# Patient Record
Sex: Female | Born: 1940 | Race: White | Hispanic: Refuse to answer | Smoking: Never smoker
Health system: Southern US, Community
[De-identification: ages and names within clinical notes are randomized; demographics above are authoritative.]

## PROBLEM LIST (undated history)

## (undated) DIAGNOSIS — N189 Chronic kidney disease, unspecified: Secondary | ICD-10-CM

## (undated) DIAGNOSIS — I6529 Occlusion and stenosis of unspecified carotid artery: Secondary | ICD-10-CM

## (undated) DIAGNOSIS — Q240 Dextrocardia: Secondary | ICD-10-CM

## (undated) DIAGNOSIS — G629 Polyneuropathy, unspecified: Secondary | ICD-10-CM

## (undated) DIAGNOSIS — I219 Acute myocardial infarction, unspecified: Secondary | ICD-10-CM

## (undated) DIAGNOSIS — E785 Hyperlipidemia, unspecified: Secondary | ICD-10-CM

## (undated) DIAGNOSIS — H269 Unspecified cataract: Secondary | ICD-10-CM

## (undated) DIAGNOSIS — Z5189 Encounter for other specified aftercare: Secondary | ICD-10-CM

## (undated) DIAGNOSIS — D649 Anemia, unspecified: Secondary | ICD-10-CM

## (undated) DIAGNOSIS — G709 Myoneural disorder, unspecified: Secondary | ICD-10-CM

## (undated) DIAGNOSIS — I2510 Atherosclerotic heart disease of native coronary artery without angina pectoris: Secondary | ICD-10-CM

## (undated) DIAGNOSIS — Q893 Situs inversus: Secondary | ICD-10-CM

## (undated) DIAGNOSIS — I509 Heart failure, unspecified: Secondary | ICD-10-CM

## (undated) DIAGNOSIS — T148XXA Other injury of unspecified body region, initial encounter: Secondary | ICD-10-CM

## (undated) DIAGNOSIS — I2699 Other pulmonary embolism without acute cor pulmonale: Secondary | ICD-10-CM

## (undated) DIAGNOSIS — M810 Age-related osteoporosis without current pathological fracture: Secondary | ICD-10-CM

## (undated) DIAGNOSIS — I82409 Acute embolism and thrombosis of unspecified deep veins of unspecified lower extremity: Secondary | ICD-10-CM

## (undated) DIAGNOSIS — M1990 Unspecified osteoarthritis, unspecified site: Secondary | ICD-10-CM

## (undated) DIAGNOSIS — E119 Type 2 diabetes mellitus without complications: Secondary | ICD-10-CM

## (undated) DIAGNOSIS — I255 Ischemic cardiomyopathy: Secondary | ICD-10-CM

## (undated) DIAGNOSIS — I5022 Chronic systolic (congestive) heart failure: Secondary | ICD-10-CM

## (undated) DIAGNOSIS — M069 Rheumatoid arthritis, unspecified: Secondary | ICD-10-CM

## (undated) DIAGNOSIS — Z8719 Personal history of other diseases of the digestive system: Secondary | ICD-10-CM

## (undated) DIAGNOSIS — T7840XA Allergy, unspecified, initial encounter: Secondary | ICD-10-CM

## (undated) DIAGNOSIS — R0902 Hypoxemia: Secondary | ICD-10-CM

## (undated) DIAGNOSIS — M549 Dorsalgia, unspecified: Secondary | ICD-10-CM

## (undated) DIAGNOSIS — I10 Essential (primary) hypertension: Secondary | ICD-10-CM

## (undated) DIAGNOSIS — G8929 Other chronic pain: Secondary | ICD-10-CM

## (undated) DIAGNOSIS — K219 Gastro-esophageal reflux disease without esophagitis: Secondary | ICD-10-CM

## (undated) DIAGNOSIS — M359 Systemic involvement of connective tissue, unspecified: Secondary | ICD-10-CM

## (undated) DIAGNOSIS — J189 Pneumonia, unspecified organism: Secondary | ICD-10-CM

## (undated) HISTORY — DX: Gastro-esophageal reflux disease without esophagitis: K21.9

## (undated) HISTORY — DX: Ischemic cardiomyopathy: I25.5

## (undated) HISTORY — DX: Chronic systolic (congestive) heart failure: I50.22

## (undated) HISTORY — DX: Hypoxemia: R09.02

## (undated) HISTORY — DX: Chronic kidney disease, unspecified: N18.9

## (undated) HISTORY — DX: Allergy, unspecified, initial encounter: T78.40XA

## (undated) HISTORY — DX: Hyperlipidemia, unspecified: E78.5

## (undated) HISTORY — DX: Myoneural disorder, unspecified: G70.9

## (undated) HISTORY — PX: CHOLECYSTECTOMY: SHX55

## (undated) HISTORY — PX: VAGINAL HYSTERECTOMY: SUR661

## (undated) HISTORY — DX: Occlusion and stenosis of unspecified carotid artery: I65.29

## (undated) HISTORY — DX: Encounter for other specified aftercare: Z51.89

## (undated) HISTORY — DX: Age-related osteoporosis without current pathological fracture: M81.0

## (undated) HISTORY — DX: Other pulmonary embolism without acute cor pulmonale: I26.99

## (undated) HISTORY — PX: BACK SURGERY: SHX140

## (undated) HISTORY — DX: Type 2 diabetes mellitus without complications: E11.9

## (undated) HISTORY — DX: Unspecified cataract: H26.9

## (undated) HISTORY — DX: Atherosclerotic heart disease of native coronary artery without angina pectoris: I25.10

## (undated) HISTORY — DX: Situs inversus: Q89.3

---

## 1998-02-15 ENCOUNTER — Ambulatory Visit (HOSPITAL_COMMUNITY): Admission: RE | Admit: 1998-02-15 | Discharge: 1998-02-15 | Payer: Self-pay | Admitting: Family Medicine

## 2000-03-13 ENCOUNTER — Encounter: Admission: RE | Admit: 2000-03-13 | Discharge: 2000-03-13 | Payer: Self-pay | Admitting: Orthopedic Surgery

## 2000-03-13 ENCOUNTER — Encounter: Payer: Self-pay | Admitting: Orthopedic Surgery

## 2000-06-30 ENCOUNTER — Encounter: Payer: Self-pay | Admitting: Family Medicine

## 2000-06-30 ENCOUNTER — Observation Stay (HOSPITAL_COMMUNITY): Admission: EM | Admit: 2000-06-30 | Discharge: 2000-07-01 | Payer: Self-pay | Admitting: Emergency Medicine

## 2002-04-13 ENCOUNTER — Emergency Department (HOSPITAL_COMMUNITY): Admission: EM | Admit: 2002-04-13 | Discharge: 2002-04-13 | Payer: Self-pay | Admitting: Emergency Medicine

## 2002-04-13 ENCOUNTER — Encounter: Payer: Self-pay | Admitting: *Deleted

## 2004-09-04 ENCOUNTER — Ambulatory Visit (HOSPITAL_COMMUNITY): Admission: RE | Admit: 2004-09-04 | Discharge: 2004-09-04 | Payer: Self-pay | Admitting: Family Medicine

## 2005-08-07 ENCOUNTER — Ambulatory Visit: Payer: Self-pay | Admitting: Internal Medicine

## 2005-09-12 ENCOUNTER — Ambulatory Visit: Payer: Self-pay | Admitting: *Deleted

## 2006-03-15 ENCOUNTER — Ambulatory Visit: Payer: Self-pay | Admitting: Internal Medicine

## 2006-04-03 ENCOUNTER — Ambulatory Visit: Payer: Self-pay | Admitting: Internal Medicine

## 2006-09-05 ENCOUNTER — Ambulatory Visit: Payer: Self-pay | Admitting: Internal Medicine

## 2006-09-16 ENCOUNTER — Ambulatory Visit: Payer: Self-pay | Admitting: Internal Medicine

## 2007-02-19 ENCOUNTER — Encounter (INDEPENDENT_AMBULATORY_CARE_PROVIDER_SITE_OTHER): Payer: Self-pay | Admitting: *Deleted

## 2011-05-24 ENCOUNTER — Emergency Department: Payer: Self-pay | Admitting: Emergency Medicine

## 2011-05-24 DIAGNOSIS — T148XXA Other injury of unspecified body region, initial encounter: Secondary | ICD-10-CM

## 2011-05-24 HISTORY — DX: Other injury of unspecified body region, initial encounter: T14.8XXA

## 2011-06-01 ENCOUNTER — Emergency Department (HOSPITAL_COMMUNITY): Payer: Medicare Other

## 2011-06-01 ENCOUNTER — Encounter: Payer: Self-pay | Admitting: *Deleted

## 2011-06-01 ENCOUNTER — Other Ambulatory Visit: Payer: Self-pay

## 2011-06-01 ENCOUNTER — Inpatient Hospital Stay (HOSPITAL_COMMUNITY)
Admission: EM | Admit: 2011-06-01 | Discharge: 2011-06-26 | DRG: 853 | Disposition: A | Discharge status: Home Health | Payer: Medicare Other | Attending: Surgery | Admitting: Surgery

## 2011-06-01 DIAGNOSIS — J9600 Acute respiratory failure, unspecified whether with hypoxia or hypercapnia: Secondary | ICD-10-CM | POA: Diagnosis not present

## 2011-06-01 DIAGNOSIS — M069 Rheumatoid arthritis, unspecified: Secondary | ICD-10-CM | POA: Diagnosis present

## 2011-06-01 DIAGNOSIS — I5021 Acute systolic (congestive) heart failure: Secondary | ICD-10-CM | POA: Diagnosis present

## 2011-06-01 DIAGNOSIS — K8020 Calculus of gallbladder without cholecystitis without obstruction: Secondary | ICD-10-CM | POA: Diagnosis present

## 2011-06-01 DIAGNOSIS — F411 Generalized anxiety disorder: Secondary | ICD-10-CM | POA: Diagnosis present

## 2011-06-01 DIAGNOSIS — A419 Sepsis, unspecified organism: Principal | ICD-10-CM | POA: Diagnosis present

## 2011-06-01 DIAGNOSIS — I2510 Atherosclerotic heart disease of native coronary artery without angina pectoris: Secondary | ICD-10-CM | POA: Diagnosis present

## 2011-06-01 DIAGNOSIS — Q240 Dextrocardia: Secondary | ICD-10-CM

## 2011-06-01 DIAGNOSIS — E876 Hypokalemia: Secondary | ICD-10-CM | POA: Diagnosis not present

## 2011-06-01 DIAGNOSIS — K8309 Other cholangitis: Secondary | ICD-10-CM | POA: Diagnosis not present

## 2011-06-01 DIAGNOSIS — K219 Gastro-esophageal reflux disease without esophagitis: Secondary | ICD-10-CM | POA: Diagnosis present

## 2011-06-01 DIAGNOSIS — I10 Essential (primary) hypertension: Secondary | ICD-10-CM | POA: Diagnosis present

## 2011-06-01 DIAGNOSIS — R6510 Systemic inflammatory response syndrome (SIRS) of non-infectious origin without acute organ dysfunction: Secondary | ICD-10-CM

## 2011-06-01 DIAGNOSIS — R5381 Other malaise: Secondary | ICD-10-CM | POA: Diagnosis present

## 2011-06-01 DIAGNOSIS — I214 Non-ST elevation (NSTEMI) myocardial infarction: Secondary | ICD-10-CM | POA: Diagnosis present

## 2011-06-01 DIAGNOSIS — Y998 Other external cause status: Secondary | ICD-10-CM

## 2011-06-01 DIAGNOSIS — K859 Acute pancreatitis without necrosis or infection, unspecified: Secondary | ICD-10-CM | POA: Diagnosis present

## 2011-06-01 DIAGNOSIS — S52123A Displaced fracture of head of unspecified radius, initial encounter for closed fracture: Secondary | ICD-10-CM | POA: Diagnosis present

## 2011-06-01 DIAGNOSIS — Z6835 Body mass index (BMI) 35.0-35.9, adult: Secondary | ICD-10-CM

## 2011-06-01 DIAGNOSIS — R7309 Other abnormal glucose: Secondary | ICD-10-CM | POA: Diagnosis present

## 2011-06-01 DIAGNOSIS — I509 Heart failure, unspecified: Secondary | ICD-10-CM | POA: Diagnosis present

## 2011-06-01 DIAGNOSIS — X58XXXA Exposure to other specified factors, initial encounter: Secondary | ICD-10-CM | POA: Diagnosis present

## 2011-06-01 DIAGNOSIS — N179 Acute kidney failure, unspecified: Secondary | ICD-10-CM

## 2011-06-01 DIAGNOSIS — Q248 Other specified congenital malformations of heart: Secondary | ICD-10-CM

## 2011-06-01 DIAGNOSIS — E2749 Other adrenocortical insufficiency: Secondary | ICD-10-CM | POA: Diagnosis present

## 2011-06-01 DIAGNOSIS — E669 Obesity, unspecified: Secondary | ICD-10-CM | POA: Diagnosis present

## 2011-06-01 DIAGNOSIS — I5022 Chronic systolic (congestive) heart failure: Secondary | ICD-10-CM

## 2011-06-01 DIAGNOSIS — R6520 Severe sepsis without septic shock: Secondary | ICD-10-CM | POA: Diagnosis present

## 2011-06-01 HISTORY — DX: Pneumonia, unspecified organism: J18.9

## 2011-06-01 HISTORY — DX: Anemia, unspecified: D64.9

## 2011-06-01 HISTORY — DX: Unspecified osteoarthritis, unspecified site: M19.90

## 2011-06-01 HISTORY — DX: Acute embolism and thrombosis of unspecified deep veins of unspecified lower extremity: I82.409

## 2011-06-01 HISTORY — DX: Acute myocardial infarction, unspecified: I21.9

## 2011-06-01 HISTORY — DX: Essential (primary) hypertension: I10

## 2011-06-01 HISTORY — DX: Personal history of other diseases of the digestive system: Z87.19

## 2011-06-01 HISTORY — DX: Polyneuropathy, unspecified: G62.9

## 2011-06-01 HISTORY — DX: Dextrocardia: Q24.0

## 2011-06-01 HISTORY — DX: Other injury of unspecified body region, initial encounter: T14.8XXA

## 2011-06-01 LAB — DIFFERENTIAL
Eosinophils Relative: 0 % (ref 0–5)
Lymphocytes Relative: 8 % — ABNORMAL LOW (ref 12–46)
Lymphs Abs: 1.2 10*3/uL (ref 0.7–4.0)
Monocytes Absolute: 0.5 10*3/uL (ref 0.1–1.0)

## 2011-06-01 LAB — COMPREHENSIVE METABOLIC PANEL
BUN: 32 mg/dL — ABNORMAL HIGH (ref 6–23)
CO2: 22 mEq/L (ref 19–32)
Calcium: 9.6 mg/dL (ref 8.4–10.5)
Creatinine, Ser: 1.39 mg/dL — ABNORMAL HIGH (ref 0.50–1.10)
GFR calc Af Amer: 44 mL/min — ABNORMAL LOW (ref >90)
GFR calc non Af Amer: 38 mL/min — ABNORMAL LOW (ref >90)
Glucose, Bld: 264 mg/dL — ABNORMAL HIGH (ref 70–99)

## 2011-06-01 LAB — CBC
HCT: 36.4 % (ref 36.0–46.0)
MCV: 92.4 fL (ref 78.0–100.0)
RBC: 3.94 MIL/uL (ref 3.87–5.11)
WBC: 14.9 10*3/uL — ABNORMAL HIGH (ref 4.0–10.5)

## 2011-06-01 LAB — TROPONIN I: Troponin I: <0.3 ng/mL (ref <0.30)

## 2011-06-01 LAB — D-DIMER, QUANTITATIVE: D-Dimer, Quant: 0.75 ug/mL-FEU — ABNORMAL HIGH (ref 0.00–0.48)

## 2011-06-01 MED ORDER — SODIUM CHLORIDE 0.9 % IV BOLUS (SEPSIS)
1000.0000 mL | Freq: Once | INTRAVENOUS | Status: AC
  Administered 2011-06-01: 1000 mL via INTRAVENOUS

## 2011-06-01 MED ORDER — CIPROFLOXACIN IN D5W 400 MG/200ML IV SOLN
400.0000 mg | Freq: Once | INTRAVENOUS | Status: AC
  Administered 2011-06-01: 400 mg via INTRAVENOUS
  Filled 2011-06-01: qty 200

## 2011-06-01 MED ORDER — ONDANSETRON HCL 4 MG/2ML IJ SOLN
4.0000 mg | Freq: Once | INTRAMUSCULAR | Status: AC
  Administered 2011-06-01: 4 mg via INTRAVENOUS
  Filled 2011-06-01: qty 2

## 2011-06-01 MED ORDER — ONDANSETRON HCL 4 MG/2ML IJ SOLN: 4.0000 mg | Freq: Once | INTRAMUSCULAR | Status: DC

## 2011-06-01 MED ORDER — SODIUM CHLORIDE 0.9 % IV BOLUS (SEPSIS): 1000.0000 mL | Freq: Once | INTRAVENOUS | Status: DC

## 2011-06-01 MED ORDER — NITROGLYCERIN 0.4 MG SL SUBL
0.4000 mg | SUBLINGUAL_TABLET | SUBLINGUAL | Status: DC | PRN
Start: 2011-06-01 — End: 2011-06-03
  Administered 2011-06-01: 0.4 mg via SUBLINGUAL
  Filled 2011-06-01: qty 25

## 2011-06-01 MED ORDER — MORPHINE SULFATE 4 MG/ML IJ SOLN
4.0000 mg | Freq: Once | INTRAMUSCULAR | Status: AC
Start: 2011-06-01 — End: 2011-06-01
  Administered 2011-06-01: 4 mg via INTRAVENOUS
  Filled 2011-06-01: qty 1

## 2011-06-01 MED ORDER — MORPHINE SULFATE 4 MG/ML IJ SOLN
4.0000 mg | Freq: Once | INTRAMUSCULAR | Status: AC
  Administered 2011-06-01: 4 mg via INTRAVENOUS
  Filled 2011-06-01: qty 1

## 2011-06-01 MED ORDER — METRONIDAZOLE IN NACL 5-0.79 MG/ML-% IV SOLN
500.0000 mg | Freq: Three times a day (TID) | INTRAVENOUS | Status: DC
  Administered 2011-06-01: 500 mg via INTRAVENOUS
  Filled 2011-06-01 (×2): qty 100

## 2011-06-01 MED ORDER — ONDANSETRON HCL 4 MG/2ML IJ SOLN
4.0000 mg | Freq: Once | INTRAMUSCULAR | Status: AC
  Administered 2011-06-01: 4 mg via INTRAVENOUS

## 2011-06-01 MED ORDER — ONDANSETRON HCL 4 MG/2ML IJ SOLN
4.0000 mg | Freq: Once | INTRAMUSCULAR | Status: DC
  Filled 2011-06-01: qty 2

## 2011-06-01 NOTE — ED Notes (Signed)
Pt presents with sudden onset of midsternal chest pain that radiates under L breast and around to L scapula.  Pt reports pain x 1-1.5 hours, reports pain began at 8-9/10 and is now 10/10.  +nausea and shortness of breath.  Pt is coughing x 2-3 days, has difficulty bringing up mucus, but is finally able to cough.  Pt has RUE in splint with sling in place, reports she fell forward x 1 week ago with fracture.  Pt is alert, oriented, reports no medication given PTA gave any relief.

## 2011-06-01 NOTE — ED Notes (Signed)
Patient transported to CT 

## 2011-06-01 NOTE — ED Notes (Signed)
Patient presents to ed via [facility] states she started having chest pain  Approx. 1 hours PTA, pain is worse on inspiration and palpation. C/o nausea. Patient was given asa x 4 chewable per ems pta, ntg, x 3 per ems pta with no relief.

## 2011-06-01 NOTE — ED Provider Notes (Signed)
History     CSN: [id number]  Arrival date & time 06/01/11  1429   First MD Initiated Contact with Patient 06/01/11 1508      Chief Complaint  Patient presents with  . Chest Pain    (Consider location/radiation/quality/duration/timing/severity/associated sxs/prior treatment) The history is provided by the patient and the spouse.   Patient is a 70 year old female with history of hypertension and dyslipidemia presents with chest pain. This started about 2 hours prior to arrival. It is located in the  low center chest and epigastric area. It occasionally radiates to back and around the left side. She has associated nausea and vomiting. Minimal dyspnea. She has had mildly productive cough for the last few days. Patient was given aspirin and nitroglycerin prior to arrival with EMS. She does not note any change in her symptoms after these interventions. No other modifying factors noted. Patient denies recent fever. She also denies history of gallbladder, pancreas, obstruction issues. Patient did recently have a fall about one week ago. She sustained elbow fracture that was splinted. No surgery. She did not sustain chest wall injury during his fall. Overall is doing well since a fall. Patient denies lower extremity swelling or pain. She does have remote history of DVT but no history of PE. Overall severity noted to be severe.  Past Medical History  Diagnosis Date  . Arthritis   . Neuropathy   . Reflux   . Hypertension   . Elevated cholesterol     Past Surgical History  Procedure Date  . Elbow fracture surgery     History reviewed. No pertinent family history. MI in father in his 28s. History  Substance Use Topics  . Smoking status: Never Smoker   . Smokeless tobacco: Not on file  . Alcohol Use: No    OB History    Grav Para Term Preterm Abortions TAB SAB Ect Mult Living                  Review of Systems  Constitutional: Negative for fever and chills.  HENT: Negative for  facial swelling.   Eyes: Negative for visual disturbance.  Respiratory: Positive for cough. Negative for chest tightness, shortness of breath and wheezing.   Cardiovascular: Positive for chest pain. Negative for leg swelling.  Gastrointestinal: Positive for nausea, vomiting and abdominal pain. Negative for diarrhea.  Genitourinary: Negative for difficulty urinating.  Skin: Negative for rash.  Neurological: Negative for weakness and numbness.  Psychiatric/Behavioral: Negative for behavioral problems and confusion.  All other systems reviewed and are negative.    Allergies  Penicillins  Home Medications   Current Outpatient Rx  Name Route Sig Dispense Refill  . ALLOPURINOL 100 MG PO TABS Oral Take 200 mg by mouth daily.      . ASPIRIN 325 MG PO TABS Oral Take 325 mg by mouth daily.      Marland Kitchen GABAPENTIN 300 MG PO CAPS Oral Take 300 mg by mouth 2 (two) times daily.      Marland Kitchen HYDROCODONE-ACETAMINOPHEN 5-500 MG PO TABS Oral Take 1 tablet by mouth every 4 (four) hours as needed. For pain.     Marland Kitchen LISINOPRIL-HYDROCHLOROTHIAZIDE 20-25 MG PO TABS Oral Take 1 tablet by mouth every morning.      Marland Kitchen OMEPRAZOLE 20 MG PO CPDR Oral Take 20 mg by mouth every morning.      Marland Kitchen PREDNISONE 5 MG PO TABS Oral Take 5 mg by mouth daily.      Marland Kitchen SIMVASTATIN 40 MG PO  TABS Oral Take 40 mg by mouth at bedtime.        BP 85/46  Pulse 146  Temp(Src) 101.7 F (38.7 C) (Oral)  Resp 29  Wt 200 lb 9.9 oz (91 kg)  SpO2 99%  Physical Exam  Nursing note and vitals reviewed. Constitutional: She is oriented to person, place, and time. She appears well-developed and well-nourished. No distress.       Uncomfortable appearing  HENT:  Head: Normocephalic.  Nose: Nose normal.  Eyes: EOM are normal.  Neck: Normal range of motion. Neck supple.  Cardiovascular: Normal rate, regular rhythm and intact distal pulses.   Pulmonary/Chest: Effort normal and breath sounds normal. No respiratory distress. She has no wheezes. She  exhibits tenderness (Tenderness to palpation along lower left ribs around to posterior ribs).  Abdominal: Soft. She exhibits no distension.       Moderate to severe epigastric tenderness to palpation without guarding, rebound.  No peritonitis. Pain worse in the epigastrium, minimal right upper quadrant tenderness to palpation.  Musculoskeletal: Normal range of motion. She exhibits no edema and no tenderness.       No calf TTP  Neurological: She is alert and oriented to person, place, and time.       Normal strength  Skin: Skin is warm and dry. No rash noted. She is not diaphoretic.  Psychiatric: She has a normal mood and affect. Her behavior is normal. Thought content normal.    ED Course  Procedures (including critical care time)  ECG: Heart rate 100. Sinus rhythm.  Normal axis. Normal intervals. Low voltage. Q wave in V2. Mild nonspecific T wave changes. Poor R wave progression.  Sinus rhythm with poor R wave progression but no acute ischemia.  Poor R wave progression but no new ischemic changes compared to 04/13/02.     Labs Reviewed  CBC - Abnormal; Notable for the following:    WBC 14.9 (*)    All other components within normal limits  DIFFERENTIAL - Abnormal; Notable for the following:    Neutrophils Relative 88 (*)    Neutro Abs 13.1 (*)    Lymphocytes Relative 8 (*)    All other components within normal limits  COMPREHENSIVE METABOLIC PANEL - Abnormal; Notable for the following:    Glucose, Bld 264 (*)    BUN 32 (*)    Creatinine, Ser 1.39 (*)    Albumin 3.0 (*)    AST 119 (*)    ALT 158 (*)    Alkaline Phosphatase 420 (*)    Total Bilirubin 1.7 (*)    GFR calc non Af Amer 38 (*)    GFR calc Af Amer 44 (*)    All other components within normal limits  LIPASE, BLOOD - Abnormal; Notable for the following:    Lipase 69 (*)    All other components within normal limits  D-DIMER, QUANTITATIVE - Abnormal; Notable for the following:    D-Dimer, Quant 0.75 (*)    All other  components within normal limits  LACTIC ACID, PLASMA - Abnormal; Notable for the following:    Lactic Acid, Venous 5.1 (*)    All other components within normal limits  POCT I-STAT 3, BLOOD GAS (G3P V) - Abnormal; Notable for the following:    pH, Ven 7.333 (*)    pCO2, Ven 39.2 (*)    Acid-base deficit 5.0 (*)    All other components within normal limits  TROPONIN I  PROCALCITONIN  APTT  PROTIME-INR  BLOOD  GAS, VENOUS   Ct Abdomen Pelvis Wo Contrast  06/01/2011  *RADIOLOGY REPORT*  Clinical Data: Chest and abdominal pain.  Nausea.  CT ABDOMEN AND PELVIS WITHOUT CONTRAST  Technique:  Multidetector CT imaging of the abdomen and pelvis was performed following the standard protocol without intravenous contrast.  Comparison: None.  Findings: Atelectasis or consolidation in the lung bases. Dextrocardia with right sided aortic arch.  Situs inversus. Cholelithiasis without obvious bile duct dilatation.  Other than the abdominal situs inversus, the unenhanced liver, spleen, stomach, adrenal glands, kidneys, and retroperitoneal lymph nodes are unremarkable.  Mild calcification of the abdominal aorta without aneurysm.  There is a suggestion of minimal infiltrative change around the head of the pancreas which could represent early focal pancreatitis.  Laboratory correlation is suggested.  No apparent pancreatic ductal dilatation.  No free fluid or free air in the abdomen.  The small bowel are not dilated.  The colon is decompressed without obvious wall thickening.  No free air or free fluid in the abdomen.  Pelvis:  The bladder wall is not thickened.  The uterus appears atrophic.  No abnormal adnexal masses.  No free or loculated pelvic fluid collections.  No inflammatory changes suggested in the sigmoid colon.  The appendix is normal.  Mild degenerative changes in the spine. Soft tissue calcification in the buttocks consistent with injection granulomas.  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 Cholelithiasis without obvious bile duct dilatation.  Suggestion of mild infiltration around the head of the pancreas which could represent early changes of pancreatitis.  Laboratory correlation recommended.  Infiltration or atelectasis in both lung bases.  Original Report Authenticated By: Marlon Pel, M.D.   Dg Chest 2 View  06/01/2011  *RADIOLOGY REPORT*  Clinical Data: Left-sided chest pain.  Shortness of breath.  CHEST - 2 VIEW  Comparison: Report of chest x-ray 04/13/2002 and 06/30/2000.  Findings: Dextrocardia is present.  Right-sided aortic arch is also noted.  There is eventration of the left hemidiaphragm as previously reported.  Minimal bibasilar airspace disease likely reflects atelectasis.  No other airspace consolidation is evident. The visualized soft tissues and bony thorax are unremarkable.  IMPRESSION:  1.  Dextrocardia and right-sided aortic arch, suggesting situs inversus. 2.  Stable eventration of left hemidiaphragm. 3.  Mild bibasilar airspace disease likely reflects atelectasis.  Original Report Authenticated By: Jamesetta Orleans. MATTERN, M.D.   US Abdomen Complete  06/01/2011  *RADIOLOGY REPORT*  Clinical Data:  Nausea, vomiting.  History of situs inversus.  COMPLETE ABDOMINAL ULTRASOUND  Comparison:  None.  Findings: Technically challenging examination secondary patient body habitus, situs inversus, and limited movement due to a right arm injury.  Gallbladder:  Gallstones are present.  No gallbladder wall thickening or pericholecystic fluid.  Negative sonographic Murphy's sign.  Common bile duct:  Mildly distended up to 8 mm.  No obstructing lesion identified however the distal duct is obscured by overlying bowel gas.  Liver:  Limited acoustic windows.  Suggestion of fatty infiltration.  IVC:  Not visualized.  Pancreas:  No focal abnormality identified within the head/neck. The body and tail are obscured by overlying bowel.  Spleen:  Within normal limits,  measuring 8 cm in diameter.  Right Kidney:  Normal sonographic appearance, without hydronephrosis.  Measures 9.6 cm.  Left Kidney:  Normal sonographic appearance, without hydronephrosis.  Measures 9.7 cm.  Abdominal aorta:  Portions were obscured by overlying bowel gas. Where seen, measures up to 2.8 cm in diameter.  IMPRESSION: Technically  challenging examination as described above.  Cholelithiasis without definitive sonographic evidence for cholecystitis.  The CBD is mildly distended up to 8 mm.  No obstructing lesion identified however the distal duct is obscured by overlying bowel gas.Correlate with LFTs and ERCP or MRCP if clinically warranted.  Original Report Authenticated By: Waneta Martins, M.D.     1. Cholelithiases   2. SIRS (systemic inflammatory response syndrome)       MDM   Patient with abrupt onset of abdominal pain, nausea, vomiting. Initial heart rate around 100 and normotensive. She did have left upper quadrant and epigastric tenderness to palpation no peritonitis. Lab studies significant for leukocytosis, elevated LFTs, mildly increased creatinine. Ultrasound was obtained. This ultrasound shows gallstones and common bile duct dilation without obvious stone. No gallbladder wall thickening or sonographic evidence of acute cholecystitis. I discussed his case with gastroenterology at [facility]. Patient will need additional evaluation for this dilated common bile duct. 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 They will follow patient in the morning. While in the ED, patient had progressive deterioration of her clinical status. Initially heart rate increasing up to 110s and then in the 120s. She's given IV fluids for dehydration and tachycardia. She was also started on Cipro, Flagyl and had continued management of her symptoms. With change in status, CT scan also ordered. This was remarkable for pancreatic head inflammation but no evidence of  perforation or surgical process. She maintained her mental status and blood pressure for the majority of this time. Patient initially was to go to internal medicine floor bed. However with change in clinical status, critical care consulted. Patient's blood pressure then started to decrease into the upper 70s and 80s. IV fluid resuscitation continued. Patient's still maintaining mental status. Still has warm extremities and 2+ distal pulses in arms and legs. Critical care evaluated the patient. They will admit to ICU. Overall change in clinical status appears consistent with progression of Sirs likely related to gallstone obstruction or pancreatitis.        Milus Glazier 06/02/11 [phone]

## 2011-06-01 NOTE — ED Notes (Signed)
C/o chest pain onset approx. 1 hour PTA

## 2011-06-02 ENCOUNTER — Encounter (HOSPITAL_COMMUNITY): Payer: Self-pay | Admitting: *Deleted

## 2011-06-02 ENCOUNTER — Emergency Department (HOSPITAL_COMMUNITY): Payer: Medicare Other

## 2011-06-02 ENCOUNTER — Inpatient Hospital Stay (HOSPITAL_COMMUNITY): Payer: Medicare Other

## 2011-06-02 DIAGNOSIS — E872 Acidosis: Secondary | ICD-10-CM

## 2011-06-02 DIAGNOSIS — A419 Sepsis, unspecified organism: Secondary | ICD-10-CM

## 2011-06-02 DIAGNOSIS — K8020 Calculus of gallbladder without cholecystitis without obstruction: Secondary | ICD-10-CM

## 2011-06-02 DIAGNOSIS — I509 Heart failure, unspecified: Secondary | ICD-10-CM

## 2011-06-02 DIAGNOSIS — R0902 Hypoxemia: Secondary | ICD-10-CM

## 2011-06-02 DIAGNOSIS — K859 Acute pancreatitis without necrosis or infection, unspecified: Secondary | ICD-10-CM

## 2011-06-02 LAB — COMPREHENSIVE METABOLIC PANEL
Albumin: 2.2 g/dL — ABNORMAL LOW (ref 3.5–5.2)
Alkaline Phosphatase: 291 U/L — ABNORMAL HIGH (ref 39–117)
BUN: 26 mg/dL — ABNORMAL HIGH (ref 6–23)
Creatinine, Ser: 1.5 mg/dL — ABNORMAL HIGH (ref 0.50–1.10)
GFR calc Af Amer: 40 mL/min — ABNORMAL LOW (ref >90)
Glucose, Bld: 210 mg/dL — ABNORMAL HIGH (ref 70–99)
Potassium: 3.4 mEq/L — ABNORMAL LOW (ref 3.5–5.1)
Total Bilirubin: 2.9 mg/dL — ABNORMAL HIGH (ref 0.3–1.2)
Total Protein: 5.3 g/dL — ABNORMAL LOW (ref 6.0–8.3)

## 2011-06-02 LAB — URINE MICROSCOPIC-ADD ON

## 2011-06-02 LAB — ABO/RH: ABO/RH(D): AB POS

## 2011-06-02 LAB — CBC
HCT: 27.6 % — ABNORMAL LOW (ref 36.0–46.0)
MCH: 30.4 pg (ref 26.0–34.0)
MCV: 94.2 fL (ref 78.0–100.0)
RBC: 2.93 MIL/uL — ABNORMAL LOW (ref 3.87–5.11)
WBC: 26.8 10*3/uL — ABNORMAL HIGH (ref 4.0–10.5)

## 2011-06-02 LAB — BASIC METABOLIC PANEL
BUN: 22 mg/dL (ref 6–23)
Calcium: 8.3 mg/dL — ABNORMAL LOW (ref 8.4–10.5)
Creatinine, Ser: 1.32 mg/dL — ABNORMAL HIGH (ref 0.50–1.10)
GFR calc non Af Amer: 40 mL/min — ABNORMAL LOW (ref >90)
Glucose, Bld: 146 mg/dL — ABNORMAL HIGH (ref 70–99)
Potassium: 4.3 mEq/L (ref 3.5–5.1)

## 2011-06-02 LAB — URINALYSIS, ROUTINE W REFLEX MICROSCOPIC
Glucose, UA: NEGATIVE mg/dL
Ketones, ur: 15 mg/dL — AB
Specific Gravity, Urine: 1.008 (ref 1.005–1.030)
pH: 5 (ref 5.0–8.0)

## 2011-06-02 LAB — POCT I-STAT 3, ART BLOOD GAS (G3+)
Bicarbonate: 19.4 mEq/L — ABNORMAL LOW (ref 20.0–24.0)
Patient temperature: 101.4
TCO2: 21 mmol/L (ref 0–100)
pH, Arterial: 7.307 — ABNORMAL LOW (ref 7.350–7.400)
pO2, Arterial: 87 mmHg (ref 80.0–100.0)

## 2011-06-02 LAB — GLUCOSE, CAPILLARY
Glucose-Capillary: 229 mg/dL — ABNORMAL HIGH (ref 70–99)
Glucose-Capillary: 136 mg/dL — ABNORMAL HIGH (ref 70–99)

## 2011-06-02 LAB — POCT I-STAT 3, VENOUS BLOOD GAS (G3P V)
Acid-base deficit: 5 mmol/L — ABNORMAL HIGH (ref 0.0–2.0)
O2 Saturation: 56 %
TCO2: 22 mmol/L (ref 0–100)
pCO2, Ven: 39.2 mmHg — ABNORMAL LOW (ref 45.0–50.0)
pO2, Ven: 31 mmHg (ref 30.0–45.0)

## 2011-06-02 LAB — PHOSPHORUS: Phosphorus: 2 mg/dL — ABNORMAL LOW (ref 2.3–4.6)

## 2011-06-02 LAB — PROTIME-INR
INR: 1.08 (ref 0.00–1.49)
INR: 1.35 (ref 0.00–1.49)
Prothrombin Time: 16.9 seconds — ABNORMAL HIGH (ref 11.6–15.2)

## 2011-06-02 LAB — LIPASE, BLOOD: Lipase: 38 U/L (ref 11–59)

## 2011-06-02 LAB — MAGNESIUM: Magnesium: 1.2 mg/dL — ABNORMAL LOW (ref 1.5–2.5)

## 2011-06-02 LAB — FIBRINOGEN: Fibrinogen: 605 mg/dL — ABNORMAL HIGH (ref 204–475)

## 2011-06-02 LAB — CARBOXYHEMOGLOBIN: O2 Saturation: 67.4 %

## 2011-06-02 LAB — EXPECTORATED SPUTUM ASSESSMENT W GRAM STAIN, RFLX TO RESP C

## 2011-06-02 LAB — LACTIC ACID, PLASMA: Lactic Acid, Venous: 1.7 mmol/L (ref 0.5–2.2)

## 2011-06-02 LAB — APTT: aPTT: 27 seconds (ref 24–37)

## 2011-06-02 LAB — CARDIAC PANEL(CRET KIN+CKTOT+MB+TROPI): Relative Index: 1.2 (ref 0.0–2.5)

## 2011-06-02 MED ORDER — SODIUM CHLORIDE 0.9 % IV BOLUS (SEPSIS)
1000.0000 mL | Freq: Once | INTRAVENOUS | Status: AC
  Administered 2011-06-02: 1000 mL via INTRAVENOUS

## 2011-06-02 MED ORDER — INSULIN ASPART 100 UNIT/ML ~~LOC~~ SOLN
0.0000 [IU] | SUBCUTANEOUS | Status: DC
  Administered 2011-06-02: 1 [IU] via SUBCUTANEOUS
  Administered 2011-06-02: 4 [IU] via SUBCUTANEOUS
  Administered 2011-06-03: 3 [IU] via SUBCUTANEOUS
  Administered 2011-06-03: 1 [IU] via SUBCUTANEOUS
  Administered 2011-06-03: 4 [IU] via SUBCUTANEOUS
  Filled 2011-06-02: qty 3

## 2011-06-02 MED ORDER — DOBUTAMINE IN D5W 4-5 MG/ML-% IV SOLN: 2.5000 ug/kg/min | INTRAVENOUS | Status: DC | PRN

## 2011-06-02 MED ORDER — METRONIDAZOLE IN NACL 5-0.79 MG/ML-% IV SOLN: 500.0000 mg | Freq: Once | INTRAVENOUS | Status: DC

## 2011-06-02 MED ORDER — DOPAMINE-DEXTROSE 3.2-5 MG/ML-% IV SOLN
2.0000 ug/kg/min | INTRAVENOUS | Status: DC
  Administered 2011-06-02: 5 ug/kg/min via INTRAVENOUS

## 2011-06-02 MED ORDER — ONDANSETRON HCL 4 MG/2ML IJ SOLN
4.0000 mg | Freq: Four times a day (QID) | INTRAMUSCULAR | Status: DC | PRN
  Administered 2011-06-04 – 2011-06-14 (×6): 4 mg via INTRAVENOUS
  Filled 2011-06-02 (×7): qty 2

## 2011-06-02 MED ORDER — SODIUM CHLORIDE 0.9 % IV SOLN: INTRAVENOUS | Status: DC

## 2011-06-02 MED ORDER — SODIUM CHLORIDE 0.9 % IV SOLN
1.0000 g | Freq: Every day | INTRAVENOUS | Status: DC
  Filled 2011-06-02: qty 1

## 2011-06-02 MED ORDER — SODIUM CHLORIDE 0.9 % IV BOLUS (SEPSIS)
500.0000 mL | Freq: Once | INTRAVENOUS | Status: AC
  Administered 2011-06-04: 500 mL via INTRAVENOUS

## 2011-06-02 MED ORDER — POTASSIUM CHLORIDE 10 MEQ/100ML IV SOLN
10.0000 meq | INTRAVENOUS | Status: AC
  Administered 2011-06-02 (×4): 10 meq via INTRAVENOUS
  Filled 2011-06-02 (×4): qty 100

## 2011-06-02 MED ORDER — SODIUM CHLORIDE 0.9 % IV BOLUS (SEPSIS): 1000.0000 mL | Freq: Once | INTRAVENOUS | Status: DC

## 2011-06-02 MED ORDER — METRONIDAZOLE IN NACL 5-0.79 MG/ML-% IV SOLN
500.0000 mg | Freq: Three times a day (TID) | INTRAVENOUS | Status: DC
  Filled 2011-06-02 (×2): qty 100

## 2011-06-02 MED ORDER — SODIUM CHLORIDE 0.9 % IV SOLN
500.0000 mg | Freq: Four times a day (QID) | INTRAVENOUS | Status: DC
  Administered 2011-06-02 (×2): 500 mg via INTRAVENOUS
  Filled 2011-06-02 (×3): qty 500

## 2011-06-02 MED ORDER — SODIUM CHLORIDE 0.9 % IV SOLN: 250.0000 mL | INTRAVENOUS | Status: DC | PRN

## 2011-06-02 MED ORDER — SODIUM CHLORIDE 0.9 % IJ SOLN
INTRAMUSCULAR | Status: AC
  Administered 2011-06-02: 10 mL
  Filled 2011-06-02: qty 10

## 2011-06-02 MED ORDER — SODIUM CHLORIDE 0.9 % IV BOLUS (SEPSIS): 500.0000 mL | INTRAVENOUS | Status: DC | PRN

## 2011-06-02 MED ORDER — NOREPINEPHRINE BITARTRATE 1 MG/ML IJ SOLN
2.0000 ug/min | INTRAVENOUS | Status: DC | PRN
  Administered 2011-06-02 – 2011-06-03 (×3): 10 ug/min via INTRAVENOUS
  Administered 2011-06-04: 12 ug/min via INTRAVENOUS
  Filled 2011-06-02 (×6): qty 8

## 2011-06-02 MED ORDER — SODIUM CHLORIDE 0.9 % IV BOLUS (SEPSIS)
500.0000 mL | Freq: Once | INTRAVENOUS | Status: AC
  Administered 2011-06-02: 500 mL via INTRAVENOUS

## 2011-06-02 MED ORDER — SODIUM CHLORIDE 0.9 % IV SOLN
INTRAVENOUS | Status: DC
  Administered 2011-06-02: 150 mL/h via INTRAVENOUS
  Administered 2011-06-03: 100 mL/h via INTRAVENOUS

## 2011-06-02 MED ORDER — ASPIRIN 300 MG RE SUPP
300.0000 mg | Freq: Once | RECTAL | Status: DC
  Filled 2011-06-02: qty 1

## 2011-06-02 MED ORDER — IMIPENEM-CILASTATIN 500 MG IV SOLR
500.0000 mg | Freq: Three times a day (TID) | INTRAVENOUS | Status: DC
  Administered 2011-06-02 – 2011-06-04 (×5): 500 mg via INTRAVENOUS
  Filled 2011-06-02 (×7): qty 500

## 2011-06-02 MED ORDER — LORAZEPAM 2 MG/ML IJ SOLN
INTRAMUSCULAR | Status: AC
  Filled 2011-06-02: qty 1

## 2011-06-02 MED ORDER — SODIUM CHLORIDE 0.9 % IV BOLUS (SEPSIS): 25.0000 mL/kg | Freq: Once | INTRAVENOUS | Status: DC

## 2011-06-02 MED ORDER — DOPAMINE-DEXTROSE 3.2-5 MG/ML-% IV SOLN: 2.0000 ug/kg/min | INTRAVENOUS | Status: DC

## 2011-06-02 MED ORDER — DOPAMINE-DEXTROSE 3.2-5 MG/ML-% IV SOLN
INTRAVENOUS | Status: AC
  Filled 2011-06-02: qty 250

## 2011-06-02 NOTE — ED Notes (Signed)
Central line placed, 2nd kit used, line placed without difficulty, tolatating well, VSS/unchanged, BP low 80/33, HR 143, pending portable cxr.

## 2011-06-02 NOTE — ED Notes (Addendum)
2nd central line kit obtained, continuing attempt at R IJ central line placement, tolerating procedure, no changes, VSS/unchanged.

## 2011-06-02 NOTE — Progress Notes (Signed)
eLink Physician-Brief Progress Note Patient Name: [name] DOB: 1941/01/17 MRN: [id number]  Date of Service  06/02/2011   HPI/Events of Note     eICU Interventions  ASA PR for pos trops   Intervention Category Intermediate Interventions: Diagnostic test evaluation  Tyshawn Ciullo V. 06/02/2011, 6:43 PM

## 2011-06-02 NOTE — Consult Note (Signed)
Reason for Consult:abdominal pain, gallstones, hypotension Referring Physician: Dr Midge Aver [name] is an 70 y.o. female.  HPI: 70 yo WF who developed acute onset of LUQ and epigastric pain yesterday around noon. She then developed nausea and vomiting.  She was in her usual state of health prior to that. She does report vomiting several times on Christmas Eve but without any abdominal pain. Pain is severe, 10/10, radiates to her back, and is described as a "grabbing" sensation.  No prior symptoms. Some lightheadedness over the past several days. Normal BM on Friday. Denies fevers, chills, melena, hematochezia, wt change, jaundice, NSAID use, hematemesis, ETOH use.  She fell in her church parking [place] recently and fractured her Rt elbow. Denies family hx of GI problems/cancers.   While the pt was in the ED, she became hypotensive with blood pressures in 70-80s. Additional IVF given and vasopressor started. She is now in the ICU febrile.  While off oxygen, the pt desaturates to a O2 sat in the high 80s and her RR increases.   Past Medical History  Diagnosis Date  . Arthritis   . Neuropathy   . Reflux   . Hypertension   . Elevated cholesterol   . Situs inversus     Past Surgical History  Procedure Date  . Elbow fracture surgery     History reviewed. No pertinent family history.  Social History:  reports that she has never smoked. She does not have any smokeless tobacco history on file. She reports that she does not drink alcohol or use illicit drugs.  Allergies:  Allergies  Allergen Reactions  . Penicillins Anaphylaxis    Medications: I have reviewed the patient's current medications.  Results for orders placed during the hospital encounter of 06/01/11 (from the past 48 hour(s))  CBC     Status: Abnormal   Collection Time   06/01/11  3:55 PM      Component Value Range Comment   WBC 14.9 (*) 4.0 - 10.5 (K/uL)    RBC 3.94  3.87 - 5.11 (MIL/uL)    Hemoglobin 12.2  12.0 - 15.0  (g/dL)    HCT 98.1  19.1 - 47.8 (%)    MCV 92.4  78.0 - 100.0 (fL)    MCH 31.0  26.0 - 34.0 (pg)    MCHC 33.5  30.0 - 36.0 (g/dL)    RDW 29.5  62.1 - 30.8 (%)    Platelets 319  150 - 400 (K/uL)   DIFFERENTIAL     Status: Abnormal   Collection Time   06/01/11  3:55 PM      Component Value Range Comment   Neutrophils Relative 88 (*) 43 - 77 (%)    Neutro Abs 13.1 (*) 1.7 - 7.7 (K/uL)    Lymphocytes Relative 8 (*) 12 - 46 (%)    Lymphs Abs 1.2  0.7 - 4.0 (K/uL)    Monocytes Relative 4  3 - 12 (%)    Monocytes Absolute 0.5  0.1 - 1.0 (K/uL)    Eosinophils Relative 0  0 - 5 (%)    Eosinophils Absolute 0.0  0.0 - 0.7 (K/uL)    Basophils Relative 0  0 - 1 (%)    Basophils Absolute 0.0  0.0 - 0.1 (K/uL)   COMPREHENSIVE METABOLIC PANEL     Status: Abnormal   Collection Time   06/01/11  3:55 PM      Component Value Range Comment   Sodium 138  135 - 145 (  mEq/L)    Potassium 3.9  3.5 - 5.1 (mEq/L)    Chloride 100  96 - 112 (mEq/L)    CO2 22  19 - 32 (mEq/L)    Glucose, Bld 264 (*) 70 - 99 (mg/dL)    BUN 32 (*) 6 - 23 (mg/dL)    Creatinine, Ser 1.19 (*) 0.50 - 1.10 (mg/dL)    Calcium 9.6  8.4 - 10.5 (mg/dL)    Total Protein 7.7  6.0 - 8.3 (g/dL)    Albumin 3.0 (*) 3.5 - 5.2 (g/dL)    AST 147 (*) 0 - 37 (U/L)    ALT 158 (*) 0 - 35 (U/L)    Alkaline Phosphatase 420 (*) 39 - 117 (U/L)    Total Bilirubin 1.7 (*) 0.3 - 1.2 (mg/dL)    GFR calc non Af Amer 38 (*) >90 (mL/min)    GFR calc Af Amer 44 (*) >90 (mL/min)   LIPASE, BLOOD     Status: Abnormal   Collection Time   06/01/11  3:55 PM      Component Value Range Comment   Lipase 69 (*) 11 - 59 (U/L)   D-DIMER, QUANTITATIVE     Status: Abnormal   Collection Time   06/01/11  3:55 PM      Component Value Range Comment   D-Dimer, Quant 0.75 (*) 0.00 - 0.48 (ug/mL-FEU)   TROPONIN I     Status: Normal   Collection Time   06/01/11  3:55 PM      Component Value Range Comment   Troponin I <0.30  <0.30 (ng/mL)   APTT     Status: Normal     Collection Time   06/01/11  3:55 PM      Component Value Range Comment   aPTT 28  24 - 37 (seconds)   PROTIME-INR     Status: Normal   Collection Time   06/01/11  3:55 PM      Component Value Range Comment   Prothrombin Time 14.2  11.6 - 15.2 (seconds)    INR 1.08  0.00 - 1.49    LACTIC ACID, PLASMA     Status: Abnormal   Collection Time   06/01/11 11:05 PM      Component Value Range Comment   Lactic Acid, Venous 5.1 (*) 0.5 - 2.2 (mmol/L)   PROCALCITONIN     Status: Normal   Collection Time   06/01/11 11:05 PM      Component Value Range Comment   Procalcitonin 5.06     POCT I-STAT 3, BLOOD GAS (G3P V)     Status: Abnormal   Collection Time   06/02/11 12:11 AM      Component Value Range Comment   pH, Ven 7.333 (*) 7.250 - 7.300     pCO2, Ven 39.2 (*) 45.0 - 50.0 (mmHg)    pO2, Ven 31.0  30.0 - 45.0 (mmHg)    Bicarbonate 20.8  20.0 - 24.0 (mEq/L)    TCO2 22  0 - 100 (mmol/L)    O2 Saturation 56.0      Acid-base deficit 5.0 (*) 0.0 - 2.0 (mmol/L)    Patient temperature 98.5 F      Sample type VENOUS      Comment NOTIFIED PHYSICIAN      RADIOLOGICAL STUDIES: I have personally reviewed the radiological exams myself   Ct Abdomen Pelvis Wo Contrast  06/01/2011  *RADIOLOGY REPORT*  Clinical Data: Chest and abdominal pain.  Nausea.  CT ABDOMEN AND  PELVIS WITHOUT CONTRAST  Technique:  Multidetector CT imaging of the abdomen and pelvis was performed following the standard protocol without intravenous contrast.  Comparison: None.  Findings: Atelectasis or consolidation in the lung bases. Dextrocardia with right sided aortic arch.  Situs inversus. Cholelithiasis without obvious bile duct dilatation.  Other than the abdominal situs inversus, the unenhanced liver, spleen, stomach, adrenal glands, kidneys, and retroperitoneal lymph nodes are unremarkable.  Mild calcification of the abdominal aorta without aneurysm.  There is a suggestion of minimal infiltrative change around the head of  the pancreas which could represent early focal pancreatitis.  Laboratory correlation is suggested.  No apparent pancreatic ductal dilatation.  No free fluid or free air in the abdomen.  The small bowel are not dilated.  The colon is decompressed without obvious wall thickening.  No free air or free fluid in the abdomen.  Pelvis:  The bladder wall is not thickened.  The uterus appears atrophic.  No abnormal adnexal masses.  No free or loculated pelvic fluid collections.  No inflammatory changes suggested in the sigmoid colon.  The appendix is normal.  Mild degenerative changes in the spine. Soft tissue calcification in the buttocks consistent with injection granulomas.  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 Cholelithiasis without obvious bile duct dilatation.  Suggestion of mild infiltration around the head of the pancreas which could represent early changes of pancreatitis.  Laboratory correlation recommended.  Infiltration or atelectasis in both lung bases.  Original Report Authenticated By: Marlon Pel, M.D.   Dg Chest 2 View  06/01/2011  *RADIOLOGY REPORT*  Clinical Data: Left-sided chest pain.  Shortness of breath.  CHEST - 2 VIEW  Comparison: Report of chest x-ray 04/13/2002 and 06/30/2000.  Findings: Dextrocardia is present.  Right-sided aortic arch is also noted.  There is eventration of the left hemidiaphragm as previously reported.  Minimal bibasilar airspace disease likely reflects atelectasis.  No other airspace consolidation is evident. The visualized soft tissues and bony thorax are unremarkable.  IMPRESSION:  1.  Dextrocardia and right-sided aortic arch, suggesting situs inversus. 2.  Stable eventration of left hemidiaphragm. 3.  Mild bibasilar airspace disease likely reflects atelectasis.  Original Report Authenticated By: Jamesetta Orleans. MATTERN, M.D.   US Abdomen Complete  06/01/2011  *RADIOLOGY REPORT*  Clinical Data:  Nausea, vomiting.  History of  situs inversus.  COMPLETE ABDOMINAL ULTRASOUND  Comparison:  None.  Findings: Technically challenging examination secondary patient body habitus, situs inversus, and limited movement due to a right arm injury.  Gallbladder:  Gallstones are present.  No gallbladder wall thickening or pericholecystic fluid.  Negative sonographic Murphy's sign.  Common bile duct:  Mildly distended up to 8 mm.  No obstructing lesion identified however the distal duct is obscured by overlying bowel gas.  Liver:  Limited acoustic windows.  Suggestion of fatty infiltration.  IVC:  Not visualized.  Pancreas:  No focal abnormality identified within the head/neck. The body and tail are obscured by overlying bowel.  Spleen:  Within normal limits, measuring 8 cm in diameter.  Right Kidney:  Normal sonographic appearance, without hydronephrosis.  Measures 9.6 cm.  Left Kidney:  Normal sonographic appearance, without hydronephrosis.  Measures 9.7 cm.  Abdominal aorta:  Portions were obscured by overlying bowel gas. Where seen, measures up to 2.8 cm in diameter.  IMPRESSION: Technically challenging examination as described above.  Cholelithiasis without definitive sonographic evidence for cholecystitis.  The CBD is mildly distended up to 8 mm.  No obstructing lesion  identified however the distal duct is obscured by overlying bowel gas.Correlate with LFTs and ERCP or MRCP if clinically warranted.  Original Report Authenticated By: Waneta Martins, M.D.   Dg Chest Portable 1 View  06/02/2011  *RADIOLOGY REPORT*  Clinical Data: Central line placement  PORTABLE CHEST - 1 VIEW  Comparison: 06/01/2011  Findings: Dextrocardia with right sided aortic arch.  Elevation of left hemidiaphragm.  Overall shallow inspiration.  Interval insertion of right central venous catheter with tip over the left sided SVC.  No pneumothorax.  IMPRESSION: Dextrocardia with right sided aortic arch.  Right central venous catheter with tip over the mid left sided SVC.  No  pneumothorax. Examination is otherwise stable since previous study.  Original Report Authenticated By: Marlon Pel, M.D.    Review of Systems  Constitutional: Positive for diaphoresis. Negative for fever, chills and weight loss.  HENT: Negative for nosebleeds, congestion and sore throat.   Eyes: Negative for blurred vision and double vision.  Respiratory: Negative for shortness of breath.        +DOE; no orthopnea, no PND  Cardiovascular: Negative for orthopnea, leg swelling and PND.  Gastrointestinal: Positive for heartburn, nausea, vomiting and abdominal pain. Negative for diarrhea, constipation, blood in stool and melena.  Genitourinary: Negative for dysuria, urgency and frequency.  Musculoskeletal:       +OA  Skin: Negative for itching and rash.  Neurological: Positive for dizziness. Negative for sensory change, focal weakness, seizures, loss of consciousness and headaches.   Blood pressure 85/46, pulse 146, temperature 101.7 F (38.7 C), temperature source Oral, resp. rate 29, weight 200 lb 9.9 oz (91 kg), SpO2 99.00%. Physical Exam  Vitals reviewed. Constitutional: She is oriented to person, place, and time. She appears well-developed and well-nourished. She is cooperative. She appears ill.       obese  HENT:  Head: Normocephalic and atraumatic.  Nose: Nose normal.  Mouth/Throat: No oropharyngeal exudate.  Eyes: Conjunctivae are normal. Right eye exhibits no discharge. Left eye exhibits no discharge. No scleral icterus.  Neck: Normal range of motion. Neck supple. No JVD present. No tracheal deviation present. No thyromegaly present.  Cardiovascular: Normal rate, regular rhythm and normal heart sounds.   Respiratory: Effort normal and breath sounds normal. No respiratory distress. She has no wheezes.  GI: Soft. She exhibits no distension. There is tenderness in the epigastric area and left upper quadrant. There is guarding. There is no rigidity and no rebound. No hernia.        Situs inversus  Musculoskeletal: Normal range of motion. She exhibits no edema and no tenderness.  Neurological: She is alert and oriented to person, place, and time. She exhibits normal muscle tone.  Skin: Skin is warm. No rash noted. She is diaphoretic. No erythema.  Psychiatric: She has a normal mood and affect. Her behavior is normal. Judgment and thought content normal.    Assessment/Plan: Cholelithiasis, doubt cholecystitis Sepsis Elevated LFTs with mildly dilated CBD, possible cholangitis Possible early acute pancreatitis Elevated Creatinine Situs inversus Obesity H/o HTN GERD  Differential dx includes cholangitis, pancreatitis, and cholecystitis. There was no wall thickness or pericholecystic fluid on her u/s which goes against cholecystitis. Her CT suggested a small focus of pancreatitis. There was no free air and the patient has no rebound or peritonitis at this time.   I think her pending AM labs and how she responds over the next several hours will dictate the next course of action.  For now, continue aggressive IVF resuscitation.  I would aim for a CVP of around 10. Continue broad spectrum IV abx to cover the biliary system.  NPO. Strict i/o.  Hold chemical VTE prophylaxis for now.   If her LFTs increase, then I would recommend investigating her bile duct with ERCP or MRI w/MRCP If her lipase/amylase increase, then I would treat her supportively for pancreatitis If need to r/o cholecystitis, then she will need HIDA scan.  Our team will follow and see her again in several hours.  Discussed recs with Dr Synetta Fail.  [name]. Andrey Campanile, MD, FACS General, Bariatric, & Minimally Invasive Surgery [facility], [place]     [facility] M 06/02/2011, 4:27 AM

## 2011-06-02 NOTE — ED Notes (Signed)
MD aware of latest vitals.

## 2011-06-02 NOTE — Progress Notes (Signed)
Mida Cory Clausing 9:51 AM  Subjective: The patient is having minimal nausea but her pain is well controlled currently and she is feeling better. Her chart and history were reviewed and I discussed her case with my partner as well as the patient and her son  Objective: Vital signs stable currently afebrile lungs are clear heart regular rate and rhythm abdomen is soft a little tender throughout without rebound positive bowel sounds. White count increased and hemoglobin decreased and bilirubin increased however other liver tests decreased and pancreatic enzymes are normal and ultrasound and CT reviewed  Assessment: Questionable cholangitis with possible CBD stone however possibility that it has passed with decreasing liver tests except for bilirubin  Plan: Since she seems stable and her ERCP is probably at increased risk and difficulty due to her sinus inversus will observe on antibiotics one more day and get a stat MRCP before proceeding with ERCP. The risks benefits methods and options were thoroughly discussed with the patient and her son including surgical options and success rates and they agree with the plan. We proceed sooner when necessary [facility]

## 2011-06-02 NOTE — H&P (Signed)
[name] is an 70 y.o. female.   Chief Complaint: Abdominal pain, hypotension HPI: Pt is a 70 y/o F withnPMHx of HTN, DL who came to the ER with Hx of 1 day of epigastric abdominal radiated to the back associated with nausea and vomiting. Pt also reported feeling warm, but denies any chills, cough, sputum production or syncopal episodes. Lst bowel movement yesterday, normal color, no hematochezia. Pt was found to have elevated liver enzymes, elevated WBC, lactate 5, RUQ US showed dilated bile duct and cholelithiasis, confirmed on CT abdomen with also mild infiltration of the apncreas with possible early pancreatitis. Pt became hypotensive in the ER in the 80's/50', tachycardic and was given 4L of NS with no improvement. Pt was started on Dopamine through peripheral line and CCM was called.   Past Medical History  Diagnosis Date  . Arthritis   . Neuropathy   . Reflux   . Hypertension   . Elevated cholesterol     Past Surgical History  Procedure Date  . Elbow fracture surgery     History reviewed. No pertinent family history. Social History:  reports that she has never smoked. She does not have any smokeless tobacco history on file. She reports that she does not drink alcohol or use illicit drugs.  Allergies:  Allergies  Allergen Reactions  . Penicillins Anaphylaxis    Medications Prior to Admission  Medication Dose Route Frequency Provider Last Rate Last Dose  . 0.9 %  sodium chloride infusion  250 mL Intravenous PRN Orbie Hurst      . 0.9 %  sodium chloride infusion   Intravenous Continuous Orbie Hurst      . ciprofloxacin (CIPRO) IVPB 400 mg  400 mg Intravenous Once Mike Schinlever   400 mg at 06/01/11 2315  . DOPamine (INTROPIN) 800 mg in dextrose 5 % 250 mL infusion  2-20 mcg/kg/min Intravenous Titrated Orbie Hurst      . ertapenem (INVANZ) 1 g in sodium chloride 0.9 % 50 mL IVPB  1 g Intravenous Q0600 Orbie Hurst      . morphine 4 MG/ML injection 4 mg  4 mg Intravenous Once  [employer]   4 mg at 06/01/11 1641  . morphine 4 MG/ML injection 4 mg  4 mg Intravenous Once [employer]   4 mg at 06/01/11 1926  . morphine 4 MG/ML injection 4 mg  4 mg Intravenous Once [employer]   4 mg at 06/01/11 2117  . nitroGLYCERIN (NITROSTAT) SL tablet 0.4 mg  0.4 mg Sublingual Q5 Min x 3 PRN Kathlene November Schinlever   0.4 mg at 06/01/11 1643  . ondansetron (ZOFRAN) injection 4 mg  4 mg Intravenous Once [employer]   4 mg at 06/01/11 1641  . ondansetron (ZOFRAN) injection 4 mg  4 mg Intravenous Once [employer]      . ondansetron (ZOFRAN) injection 4 mg  4 mg Intravenous Once [employer]   4 mg at 06/01/11 1926  . ondansetron (ZOFRAN) injection 4 mg  4 mg Intravenous Once [employer]   4 mg at 06/01/11 2117  . ondansetron (ZOFRAN) injection 4 mg  4 mg Intravenous Once [employer]      . sodium chloride 0.9 % bolus 1,000 mL  1,000 mL Intravenous Once [employer]   1,000 mL at 06/01/11 1550  . sodium chloride 0.9 % bolus 1,000 mL  1,000 mL Intravenous Once [employer]      . sodium chloride 0.9 % bolus 1,000 mL  1,000 mL Intravenous Once [employer]   1,000 mL at 06/01/11 2117  . sodium chloride 0.9 % bolus 1,000 mL  1,000 mL Intravenous Once [employer]   1,000 mL at 06/01/11 2224  . sodium chloride 0.9 % bolus 1,000 mL  1,000 mL Intravenous Once [employer]   1,000 mL at 06/02/11 0013  . sodium chloride 0.9 % bolus 1,000 mL  1,000 mL Intravenous Once [employer]      . sodium chloride 0.9 % bolus 500 mL  500 mL Intravenous Once Orbie Hurst      . DISCONTD: DOPamine (INTROPIN) 800 mg in dextrose 5 % 250 mL infusion  2-20 mcg/kg/min Intravenous Titrated Keyshon Stein   5 mcg/kg/min at 06/02/11 0140  . DISCONTD: metroNIDAZOLE (FLAGYL) IVPB 500 mg  500 mg Intravenous Q8H Mike Schinlever   500 mg at 06/01/11 2230   No current outpatient prescriptions on file as of 06/02/2011.    Results for orders placed during the hospital encounter of 06/01/11  (from the past 48 hour(s))  CBC     Status: Abnormal   Collection Time   06/01/11  3:55 PM      Component Value Range Comment   WBC 14.9 (*) 4.0 - 10.5 (K/uL)    RBC 3.94  3.87 - 5.11 (MIL/uL)    Hemoglobin 12.2  12.0 - 15.0 (g/dL)    HCT 40.9  81.1 - 91.4 (%)    MCV 92.4  78.0 - 100.0 (fL)    MCH 31.0  26.0 - 34.0 (pg)    MCHC 33.5  30.0 - 36.0 (g/dL)    RDW 78.2  95.6 - 21.3 (%)    Platelets 319  150 - 400 (K/uL)   DIFFERENTIAL     Status: Abnormal   Collection Time   06/01/11  3:55 PM      Component Value Range Comment   Neutrophils Relative 88 (*) 43 - 77 (%)    Neutro Abs 13.1 (*) 1.7 - 7.7 (K/uL)    Lymphocytes Relative 8 (*) 12 - 46 (%)    Lymphs Abs 1.2  0.7 - 4.0 (K/uL)    Monocytes Relative 4  3 - 12 (%)    Monocytes Absolute 0.5  0.1 - 1.0 (K/uL)    Eosinophils Relative 0  0 - 5 (%)    Eosinophils Absolute 0.0  0.0 - 0.7 (K/uL)    Basophils Relative 0  0 - 1 (%)    Basophils Absolute 0.0  0.0 - 0.1 (K/uL)   COMPREHENSIVE METABOLIC PANEL     Status: Abnormal   Collection Time   06/01/11  3:55 PM      Component Value Range Comment   Sodium 138  135 - 145 (mEq/L)    Potassium 3.9  3.5 - 5.1 (mEq/L)    Chloride 100  96 - 112 (mEq/L)    CO2 22  19 - 32 (mEq/L)    Glucose, Bld 264 (*) 70 - 99 (mg/dL)    BUN 32 (*) 6 - 23 (mg/dL)    Creatinine, Ser 0.86 (*) 0.50 - 1.10 (mg/dL)    Calcium 9.6  8.4 - 10.5 (mg/dL)    Total Protein 7.7  6.0 - 8.3 (g/dL)    Albumin 3.0 (*) 3.5 - 5.2 (g/dL)    AST 578 (*) 0 - 37 (U/L)    ALT 158 (*) 0 - 35 (U/L)    Alkaline Phosphatase 420 (*) 39 - 117 (U/L)    Total Bilirubin  1.7 (*) 0.3 - 1.2 (mg/dL)    GFR calc non Af Amer 38 (*) >90 (mL/min)    GFR calc Af Amer 44 (*) >90 (mL/min)   LIPASE, BLOOD     Status: Abnormal   Collection Time   06/01/11  3:55 PM      Component Value Range Comment   Lipase 69 (*) 11 - 59 (U/L)   D-DIMER, QUANTITATIVE     Status: Abnormal   Collection Time   06/01/11  3:55 PM      Component Value Range  Comment   D-Dimer, Quant 0.75 (*) 0.00 - 0.48 (ug/mL-FEU)   TROPONIN I     Status: Normal   Collection Time   06/01/11  3:55 PM      Component Value Range Comment   Troponin I <0.30  <0.30 (ng/mL)   APTT     Status: Normal   Collection Time   06/01/11  3:55 PM      Component Value Range Comment   aPTT 28  24 - 37 (seconds)   PROTIME-INR     Status: Normal   Collection Time   06/01/11  3:55 PM      Component Value Range Comment   Prothrombin Time 14.2  11.6 - 15.2 (seconds)    INR 1.08  0.00 - 1.49    LACTIC ACID, PLASMA     Status: Abnormal   Collection Time   06/01/11 11:05 PM      Component Value Range Comment   Lactic Acid, Venous 5.1 (*) 0.5 - 2.2 (mmol/L)   PROCALCITONIN     Status: Normal   Collection Time   06/01/11 11:05 PM      Component Value Range Comment   Procalcitonin 5.06     POCT I-STAT 3, BLOOD GAS (G3P V)     Status: Abnormal   Collection Time   06/02/11 12:11 AM      Component Value Range Comment   pH, Ven 7.333 (*) 7.250 - 7.300     pCO2, Ven 39.2 (*) 45.0 - 50.0 (mmHg)    pO2, Ven 31.0  30.0 - 45.0 (mmHg)    Bicarbonate 20.8  20.0 - 24.0 (mEq/L)    TCO2 22  0 - 100 (mmol/L)    O2 Saturation 56.0      Acid-base deficit 5.0 (*) 0.0 - 2.0 (mmol/L)    Patient temperature 98.5 F      Sample type VENOUS      Comment NOTIFIED PHYSICIAN      Ct Abdomen Pelvis Wo Contrast  06/01/2011  *RADIOLOGY REPORT*  Clinical Data: Chest and abdominal pain.  Nausea.  CT ABDOMEN AND PELVIS WITHOUT CONTRAST  Technique:  Multidetector CT imaging of the abdomen and pelvis was performed following the standard protocol without intravenous contrast.  Comparison: None.  Findings: Atelectasis or consolidation in the lung bases. Dextrocardia with right sided aortic arch.  Situs inversus. Cholelithiasis without obvious bile duct dilatation.  Other than the abdominal situs inversus, the unenhanced liver, spleen, stomach, adrenal glands, kidneys, and retroperitoneal lymph nodes are  unremarkable.  Mild calcification of the abdominal aorta without aneurysm.  There is a suggestion of minimal infiltrative change around the head of the pancreas which could represent early focal pancreatitis.  Laboratory correlation is suggested.  No apparent pancreatic ductal dilatation.  No free fluid or free air in the abdomen.  The small bowel are not dilated.  The colon is decompressed without obvious wall thickening.  No free air  or free fluid in the abdomen.  Pelvis:  The bladder wall is not thickened.  The uterus appears atrophic.  No abnormal adnexal masses.  No free or loculated pelvic fluid collections.  No inflammatory changes suggested in the sigmoid colon.  The appendix is normal.  Mild degenerative changes in the spine. Soft tissue calcification in the buttocks consistent with injection granulomas.  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 Cholelithiasis without obvious bile duct dilatation.  Suggestion of mild infiltration around the head of the pancreas which could represent early changes of pancreatitis.  Laboratory correlation recommended.  Infiltration or atelectasis in both lung bases.  Original Report Authenticated By: Marlon Pel, M.D.   Dg Chest 2 View  06/01/2011  *RADIOLOGY REPORT*  Clinical Data: Left-sided chest pain.  Shortness of breath.  CHEST - 2 VIEW  Comparison: Report of chest x-ray 04/13/2002 and 06/30/2000.  Findings: Dextrocardia is present.  Right-sided aortic arch is also noted.  There is eventration of the left hemidiaphragm as previously reported.  Minimal bibasilar airspace disease likely reflects atelectasis.  No other airspace consolidation is evident. The visualized soft tissues and bony thorax are unremarkable.  IMPRESSION:  1.  Dextrocardia and right-sided aortic arch, suggesting situs inversus. 2.  Stable eventration of left hemidiaphragm. 3.  Mild bibasilar airspace disease likely reflects atelectasis.  Original Report  Authenticated By: Jamesetta Orleans. MATTERN, M.D.   US Abdomen Complete  06/01/2011  *RADIOLOGY REPORT*  Clinical Data:  Nausea, vomiting.  History of situs inversus.  COMPLETE ABDOMINAL ULTRASOUND  Comparison:  None.  Findings: Technically challenging examination secondary patient body habitus, situs inversus, and limited movement due to a right arm injury.  Gallbladder:  Gallstones are present.  No gallbladder wall thickening or pericholecystic fluid.  Negative sonographic Murphy's sign.  Common bile duct:  Mildly distended up to 8 mm.  No obstructing lesion identified however the distal duct is obscured by overlying bowel gas.  Liver:  Limited acoustic windows.  Suggestion of fatty infiltration.  IVC:  Not visualized.  Pancreas:  No focal abnormality identified within the head/neck. The body and tail are obscured by overlying bowel.  Spleen:  Within normal limits, measuring 8 cm in diameter.  Right Kidney:  Normal sonographic appearance, without hydronephrosis.  Measures 9.6 cm.  Left Kidney:  Normal sonographic appearance, without hydronephrosis.  Measures 9.7 cm.  Abdominal aorta:  Portions were obscured by overlying bowel gas. Where seen, measures up to 2.8 cm in diameter.  IMPRESSION: Technically challenging examination as described above.  Cholelithiasis without definitive sonographic evidence for cholecystitis.  The CBD is mildly distended up to 8 mm.  No obstructing lesion identified however the distal duct is obscured by overlying bowel gas.Correlate with LFTs and ERCP or MRCP if clinically warranted.  Original Report Authenticated By: Waneta Martins, M.D.   Dg Chest Portable 1 View  06/02/2011  *RADIOLOGY REPORT*  Clinical Data: Central line placement  PORTABLE CHEST - 1 VIEW  Comparison: 06/01/2011  Findings: Dextrocardia with right sided aortic arch.  Elevation of left hemidiaphragm.  Overall shallow inspiration.  Interval insertion of right central venous catheter with tip over the left sided  SVC.  No pneumothorax.  IMPRESSION: Dextrocardia with right sided aortic arch.  Right central venous catheter with tip over the mid left sided SVC.  No pneumothorax. Examination is otherwise stable since previous study.  Original Report Authenticated By: Marlon Pel, M.D.    ROS All other systems were negative, except as  above in HPI.  Blood pressure 85/46, pulse 146, temperature 101.7 F (38.7 C), temperature source Oral, resp. rate 29, weight 91 kg (200 lb 9.9 oz), SpO2 99.00%. Physical Exam  Pt is alert, in pain, in no acute resp distress HEENT: no JVD. CV: tachycardic rhythm, S1 and S2 normal Lungs: decreased breath sounds at the bases with no crackles, rhonchi or wheezes. Abdomen: Tender on palpation in the RUQ and epigastrium. Decreased bowel sounds, no peritoneal signs. Extrem: no low extrem edema, no calf tenderness. Neurol: no focal deficit.    Assessment/Plan  Pt is a 70 y/o F withnPMHx of HTN, DL who came to the ER with Hx of 1 day of epigastric abdominal radiated to the back associated with nausea and vomiting and was found to have elevated liver enzymes, elevated WBC, lactate 5, RUQ US showed dilated bile duct and cholelithiasis, confirmed on CT abdomen with also mild infiltration of the apncreas with possible early pancreatitis. Pt became hypotensive in the ER in the 80's/50', tachycardic and was given 4L of NS with no improvement. Pt was started on Dopamine through peripheral line and CCM was called.   1. Sepsis/Septic shock - Likely due to gallbladder pathology - Cholangitis vs gallstone pancreatitis vs cholelithiasis/chocystitis - WBC 14.9, fevers of 102 in the ER, tachycardic - [place] and CT abdomen with gallstones and possible early pancreatitis - Alk phos 420, elevated AST and ALT, TB 1.7 - Started on Cipro and Flagyl in the Er due to [facility] allergies - Pt was d/w GI and pt will benefit from Imipenem/Etarnapenem due to severe sepsis/septic shock. Will monitor for  allergic reaction. -  Will start sepsis protocol - Blood cultures, urine cult. - CXR with bibasilar atelectasis, confirmed on CT abdomen. - GI and Surg on consult. Likely MRCP when pt is more stable. - Continue supportive management - Cont Pressors.   2. Cholelithiasis - Possible cholangitis, vs early pancreatitis - Repeat labs - Possible MRCP vs ERCP - On antibiotics, sepsis protocol - GI and surg on consult  3. ARF - Likely due to sepsis/intravasc vol depletion - Received 4L NS, check CVP - Cont IVF bolus if CVP less than 12 - Cont pressors. - Follow CMP  4. HTN - Hold BP meds for now  5. DVT prophylaxis - SCDs  6. FEN  - NS bolus, NPO, replace electrolytes as needed.  Pt was discussed with GI and surgery at the bedside.    Lavetta Geier 06/02/2011, 4:03 AM

## 2011-06-02 NOTE — Significant Event (Signed)
CRITICAL VALUE ALERT  Critical value received:  CKMB 14.6 Troponin 0.48  Date of notification:  06/02/11  Time of notification:  1840  Critical value read back: yes  Nurse who received alert:  Rolland Porter  MD notified (1st page): Dr.Alva  Time of first page: 1840  MD notified (2nd page):  Time of second page:  Responding MD:  Alvie Heidelberg  Time MD responded:  [phone]

## 2011-06-02 NOTE — Progress Notes (Signed)
[name] 5:09 PM  Subjective: Patient remained stable this afternoon post MRCP and other than nausea and not liking the MRI she is doing okay  Objective: Vital signs stable exam unchanged  Assessment: Positive CBD stone on MRCP  Plan: I read discussed the risks benefits methods of ERCP including success rate and surgical options with the patient and her husband and daughter and will proceed when necessary if signs of cholangitis tonight and otherwise she is currently scheduled for 8 AM tomorrow. I did try to proceed in the operating room this afternoon but they are too busy with other emergencies  [facility]

## 2011-06-02 NOTE — Progress Notes (Signed)
MEDICATION RELATED CONSULT NOTE - INITIAL   Pharmacy Consult for Septic Shock, Antibiotic Monitoring Imipenem Indication: Septic shock from cholangitis vs gallstone pancreatitis  Allergies  Allergen Reactions  . Penicillins Anaphylaxis   Assessment: 70 yo F admitted 12/29 with abdominal pain and nausea and vomiting.  found to have elevated liver enzymes, elevated WBC, lactate 5, RUQ US showed dilated bile duct and cholelithiasis, confirmed on CT abdomen with also mild infiltration of the apncreas with possible early pancreatitis. Pt became hypotensive and started on cipro, flagyl and broadened to imipenem.  Pharmacist System-Based Medication Review:  Infectious Disease: septic shock cholangitis v. Gallstone pancreatitis D#1 imipenem currently 500 mg q6h, afebrile, WBC 26.8, blood & urine cultures pending Cardiovascular: HTN, septic shock, ASA, lisinopril/HCTZ, simvastatin PTA on hold on Levophed at 10 mcg/min Endocrinology: Hyperglycemia 210, 229, follow-up initiation of ICU hyperglycemia protocol, on prednisone 5 mg daily and allopruinol 200 mg daily PTA Gastrointestinal: plan for ERCP Fluid, Electrolytes, & Nutrition: aggressive hydration Neurology: Neuropathy Gabapentin 300 mg BID Nephrology: Acute Renal Failure, SCr 1.5, est CrCl 57 ml/min  given weight, follow course with hydration PTA Medication Issues: all currently on hold Best Practices: SCDs  Goal of Therapy:  Renal adjustment of antibiotics, evaluation of medication regimen  Plan:  1. Given weight and current Scr will change imipenem to 500 mg IV q8h, follow up SCr, UOP, cultures, clinical course and adjust as clinically indicated. 2. On chronic steroids PTA (prednisone 5 mg daily), for gout?, follow up if stress dose steroids are indicated in current clinical situation.  Patient Measurements: Weight: 224 lb 6.9 oz (101.8 kg) Vital Signs: Temp: 97.9 F (36.6 C) (12/29 0960) Temp src: Oral (12/29 0808) BP: 85/46 mmHg  (12/29 0230) Pulse Rate: 94  (12/29 1000) Intake/Output from previous day: 12/28 0701 - 12/29 0700 In: 6923.1 [I.V.:6323.1; IV Piggyback:600] Out: -  Intake/Output from this shift: Total I/O In: 356.4 [I.V.:356.4] Out: -   Labs:  Basename 06/02/11 0400 06/01/11 1555  WBC 26.8* 14.9*  HGB 8.9* 12.2  HCT 27.6* 36.4  PLT 242 319  APTT 27 28  CREATININE 1.50* 1.39*  LABCREA -- --  CREATININE 1.50* 1.39*  CREAT24HRUR -- --  MG 1.2* --  PHOS 2.0* --  ALBUMIN 2.2* 3.0*  PROT 5.3* 7.7  ALBUMIN 2.2* 3.0*  AST 79* 119*  ALT 118* 158*  ALKPHOS 291* 420*  BILITOT 2.9* 1.7*  BILIDIR -- --  IBILI -- --   CrCl is unknown because there is no height on file for the current visit.   Microbiology: Recent Results (from the past 720 hour(s))  MRSA PCR SCREENING     Status: Normal   Collection Time   06/02/11  8:40 AM      Component Value Range Status Comment   MRSA by PCR NEGATIVE  NEGATIVE  Final     Medical History: Past Medical History  Diagnosis Date  . Arthritis   . Neuropathy   . Reflux   . Hypertension   . Elevated cholesterol   . Situs inversus      Medications:  Prescriptions prior to admission  Medication Sig Dispense Refill  . allopurinol (ZYLOPRIM) 100 MG tablet Take 200 mg by mouth daily.        Marland Kitchen aspirin 325 MG tablet Take 325 mg by mouth daily.        Marland Kitchen gabapentin (NEURONTIN) 300 MG capsule Take 300 mg by mouth 2 (two) times daily.        Marland Kitchen HYDROcodone-acetaminophen (  VICODIN) 5-500 MG per tablet Take 1 tablet by mouth every 4 (four) hours as needed. For pain.       Marland Kitchen lisinopril-hydrochlorothiazide (PRINZIDE,ZESTORETIC) 20-25 MG per tablet Take 1 tablet by mouth every morning.        Marland Kitchen omeprazole (PRILOSEC) 20 MG capsule Take 20 mg by mouth every morning.        . predniSONE (DELTASONE) 5 MG tablet Take 5 mg by mouth daily.        . simvastatin (ZOCOR) 40 MG tablet Take 40 mg by mouth at bedtime.            [name] 06/02/2011,12:04 PM

## 2011-06-02 NOTE — Consult Note (Signed)
[facility] Gastroenterology Consultation Note  Referring Provider: Dr. Orbie Hurst (PCCM) Primary Care Physician:  Dr. Elias Else  Reason for Consultation:  Abdominal pain, hypotension  HPI: [name] is a 70 y.o. female we are asked to see for abdominal pain.  In static state of health until yesterday, when developed progressive left upper quadrant pain.  Symptoms worsened, necessitating emergency department visit.  She has had some fevers (102 in ED) with chills, as well as nausea/vomiting.  No prior history of stomach troubles until yesterday.  No blood in stool.  Once in emergency department, she was found to have leukocytosis and mild elevation of liver tests.  Ultrasound showed gallstones without cholecystitis, prominent bile duct (8mm).  CT scan showed inflammation around head of pancreas (compatible with mild pancreatitis).  She has been given several liters of fluid in emergency department for hypotension (SBP ~ 80) and has received doses of ciprofloxacin and metronidazole.  At present, her abdominal pain has resolved.  She feels a bit short-of-breath.  Some nausea.   Past Medical History  Diagnosis Date  . Arthritis   . Neuropathy   . Reflux   . Hypertension   . Elevated cholesterol   . Situs inversus     Past Surgical History  Procedure Date  . Elbow fracture surgery     Prior to Admission medications   Medication Sig Start Date End Date Taking? Authorizing Provider  allopurinol (ZYLOPRIM) 100 MG tablet Take 200 mg by mouth daily.     Yes Historical Provider, MD  aspirin 325 MG tablet Take 325 mg by mouth daily.     Yes Historical Provider, MD  gabapentin (NEURONTIN) 300 MG capsule Take 300 mg by mouth 2 (two) times daily.     Yes Historical Provider, MD  HYDROcodone-acetaminophen (VICODIN) 5-500 MG per tablet Take 1 tablet by mouth every 4 (four) hours as needed. For pain.    Yes Historical Provider, MD  lisinopril-hydrochlorothiazide (PRINZIDE,ZESTORETIC) 20-25 MG per  tablet Take 1 tablet by mouth every morning.     Yes Historical Provider, MD  omeprazole (PRILOSEC) 20 MG capsule Take 20 mg by mouth every morning.     Yes Historical Provider, MD  predniSONE (DELTASONE) 5 MG tablet Take 5 mg by mouth daily.     Yes Historical Provider, MD  simvastatin (ZOCOR) 40 MG tablet Take 40 mg by mouth at bedtime.     Yes Historical Provider, MD    Current Facility-Administered Medications  Medication Dose Route Frequency Provider Last Rate Last Dose  . 0.9 %  sodium chloride infusion  250 mL Intravenous PRN Orbie Hurst      . 0.9 %  sodium chloride infusion   Intravenous Continuous Orbie Hurst      . sodium chloride 0.9 % bolus 2,275 mL  25 mL/kg Intravenous Once [employer]       Followed by  . 0.9 %  sodium chloride infusion   Intravenous Continuous Orbie Hurst      . ciprofloxacin (CIPRO) IVPB 400 mg  400 mg Intravenous Once Mike Schinlever   400 mg at 06/01/11 2315  . sodium chloride 0.9 % bolus 500 mL  500 mL Intravenous Once Orbie Hurst       And  . sodium chloride 0.9 % bolus 500 mL  500 mL Intravenous Q30 min PRN Orbie Hurst       And  . norepinephrine (LEVOPHED) 4 mg in dextrose 5 % 250 mL infusion  2-30 mcg/min Intravenous PRN  Orbie Hurst       And  . DOBUTamine (DOBUTREX) infusion 4000 mcg/mL  2.5-10 mcg/kg/min Intravenous PRN Orbie Hurst      . DOPamine (INTROPIN) 800 mg in dextrose 5 % 250 mL infusion  2-20 mcg/kg/min Intravenous Titrated Orbie Hurst      . ertapenem (INVANZ) 1 g in sodium chloride 0.9 % 50 mL IVPB  1 g Intravenous Q0600 Orbie Hurst      . morphine 4 MG/ML injection 4 mg  4 mg Intravenous Once [employer]   4 mg at 06/01/11 1641  . morphine 4 MG/ML injection 4 mg  4 mg Intravenous Once [employer]   4 mg at 06/01/11 1926  . morphine 4 MG/ML injection 4 mg  4 mg Intravenous Once [employer]   4 mg at 06/01/11 2117  . nitroGLYCERIN (NITROSTAT) SL tablet 0.4 mg  0.4 mg Sublingual Q5 Min x 3 PRN Kathlene November Schinlever   0.4 mg at 06/01/11 1643    . ondansetron (ZOFRAN) injection 4 mg  4 mg Intravenous Once [employer]   4 mg at 06/01/11 1641  . ondansetron (ZOFRAN) injection 4 mg  4 mg Intravenous Once [employer]      . ondansetron (ZOFRAN) injection 4 mg  4 mg Intravenous Once [employer]   4 mg at 06/01/11 1926  . ondansetron (ZOFRAN) injection 4 mg  4 mg Intravenous Once [employer]   4 mg at 06/01/11 2117  . ondansetron (ZOFRAN) injection 4 mg  4 mg Intravenous Once [employer]      . sodium chloride 0.9 % bolus 1,000 mL  1,000 mL Intravenous Once [employer]   1,000 mL at 06/01/11 1550  . sodium chloride 0.9 % bolus 1,000 mL  1,000 mL Intravenous Once [employer]      . sodium chloride 0.9 % bolus 1,000 mL  1,000 mL Intravenous Once [employer]   1,000 mL at 06/01/11 2117  . sodium chloride 0.9 % bolus 1,000 mL  1,000 mL Intravenous Once [employer]   1,000 mL at 06/01/11 2224  . sodium chloride 0.9 % bolus 1,000 mL  1,000 mL Intravenous Once [employer]   1,000 mL at 06/02/11 0013  . sodium chloride 0.9 % bolus 1,000 mL  1,000 mL Intravenous Once [employer]      . sodium chloride 0.9 % bolus 500 mL  500 mL Intravenous Once Orbie Hurst      . DISCONTD: DOPamine (INTROPIN) 800 mg in dextrose 5 % 250 mL infusion  2-20 mcg/kg/min Intravenous Titrated Juan Rojas   5 mcg/kg/min at 06/02/11 0140  . DISCONTD: metroNIDAZOLE (FLAGYL) IVPB 500 mg  500 mg Intravenous Q8H Mike Schinlever   500 mg at 06/01/11 2230  . DISCONTD: metroNIDAZOLE (FLAGYL) IVPB 500 mg  500 mg Intravenous Once Orbie Hurst        Allergies as of 06/01/2011 - Review Complete 06/01/2011  Allergen Reaction Noted  . Penicillins Anaphylaxis 06/01/2011    History reviewed. No pertinent family history.  History   Social History  . Marital Status: Married    Spouse Name: N/A    Number of Children: N/A  . Years of Education: N/A   Occupational History  . Not on file.   Social History Main Topics  . Smoking status: Never  Smoker   . Smokeless tobacco: Not on file  . Alcohol Use: No  . Drug Use: No  . Sexually Active:    Other Topics Concern  .  Not on file   Social History Narrative  . No narrative on file    Review of Systems: Positive for: chills, rigors, night sweats, dyspnea Gen: Denies any fever, , anorexia, fatigue, weakness, malaise, involuntary weight loss, and sleep disorder CV: Denies chest pain, angina, palpitations, syncope, orthopnea, PND, peripheral edema, and claudication. Resp: Denies, cough, sputum, wheezing, coughing up blood. GI: Described in detail in HPI.    GU : Denies urinary burning, blood in urine, urinary frequency, urinary hesitancy, nocturnal urination, and urinary incontinence. MS: Denies joint pain or swelling.  Denies muscle weakness, cramps, atrophy.  Derm: Denies rash, itching, oral ulcerations, hives, unhealing ulcers.  Psych: Denies depression, anxiety, memory loss, suicidal ideation, hallucinations,  and confusion. Heme: Denies bruising, bleeding, and enlarged lymph nodes. Neuro:  Denies any headaches, dizziness, paresthesias. Endo:  Denies any problems with DM, thyroid, adrenal function.  Physical Exam: Vital signs in last 24 hours: Temp:  [97.9 F (36.6 C)-102.6 F (39.2 C)] 101.7 F (38.7 C) (12/29 0230) Pulse Rate:  [81-153] 146  (12/29 0230) Resp:  [19-30] 29  (12/29 0230) BP: (64-126)/(37-66) 85/46 mmHg (12/29 0230) SpO2:  [89 %-99 %] 99 % (12/29 0230) Weight:  [91 kg (200 lb 9.9 oz)] 200 lb 9.9 oz (91 kg) (12/29 0142)   General:   Alert,  Mild/moderate tachypnea, somewhat ill-appearing Head:  Normocephalic and atraumatic. Eyes:  Sclera clear, possible mild bilateral scleral icterus.   Conjunctiva pink. Nose:  No deformity, discharge,  or lesions. Mouth:  No deformity or lesions.  Dry Mucous membranes Neck:  Thick, Supple; no masses or thyromegaly. Lungs:  Bibasilar rales.   No wheezes, crackles, or rhonchi. Mild/moderate tachypnea Heart:   Dextrocardia, tachycardic, regular, normal rhythm; no murmurs, clicks, rubs,  or gallops. Abdomen:  Soft, minimal bowel sounds, mild protuberant/distended, LUQ tenderness. No masses, hepatosplenomegaly or hernias noted. No guarding, and without rebound.     Msk:  Symmetrical without gross deformities. Normal posture. Pulses:  Normal pulses noted. Extremities:  Trace edema. Neurologic:  Alert and  oriented x4;  grossly normal neurologically. Skin:  Intact without significant lesions or rashes. Psych:  Alert and cooperative. Normal mood and affect.   Lab Results:  [facility] 06/01/11 1555  WBC 14.9*  HGB 12.2  HCT 36.4  PLT 319   BMET  Basename 06/01/11 1555  NA 138  K 3.9  CL 100  CO2 22  GLUCOSE 264*  BUN 32*  CREATININE 1.39*  CALCIUM 9.6   LFT  Basename 06/01/11 1555  PROT 7.7  ALBUMIN 3.0*  AST 119*  ALT 158*  ALKPHOS 420*  BILITOT 1.7*  BILIDIR --  IBILI --   PT/INR  Basename 06/01/11 1555  LABPROT 14.2  INR 1.08    Studies/Results: Ct Abdomen Pelvis Wo Contrast  06/01/2011  *RADIOLOGY REPORT*  Clinical Data: Chest and abdominal pain.  Nausea.  CT ABDOMEN AND PELVIS WITHOUT CONTRAST  Technique:  Multidetector CT imaging of the abdomen and pelvis was performed following the standard protocol without intravenous contrast.  Comparison: None.  Findings: Atelectasis or consolidation in the lung bases. Dextrocardia with right sided aortic arch.  Situs inversus. Cholelithiasis without obvious bile duct dilatation.  Other than the abdominal situs inversus, the unenhanced liver, spleen, stomach, adrenal glands, kidneys, and retroperitoneal lymph nodes are unremarkable.  Mild calcification of the abdominal aorta without aneurysm.  There is a suggestion of minimal infiltrative change around the head of the pancreas which could represent early focal pancreatitis.  Laboratory correlation is suggested.  No apparent pancreatic ductal dilatation.  No free fluid or free air in  the abdomen.  The small bowel are not dilated.  The colon is decompressed without obvious wall thickening.  No free air or free fluid in the abdomen.  Pelvis:  The bladder wall is not thickened.  The uterus appears atrophic.  No abnormal adnexal masses.  No free or loculated pelvic fluid collections.  No inflammatory changes suggested in the sigmoid colon.  The appendix is normal.  Mild degenerative changes in the spine. Soft tissue calcification in the buttocks consistent with injection granulomas.  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 Cholelithiasis without obvious bile duct dilatation.  Suggestion of mild infiltration around the head of the pancreas which could represent early changes of pancreatitis.  Laboratory correlation recommended.  Infiltration or atelectasis in both lung bases.  Original Report Authenticated By: Marlon Pel, M.D.   Dg Chest 2 View  06/01/2011  *RADIOLOGY REPORT*  Clinical Data: Left-sided chest pain.  Shortness of breath.  CHEST - 2 VIEW  Comparison: Report of chest x-ray 04/13/2002 and 06/30/2000.  Findings: Dextrocardia is present.  Right-sided aortic arch is also noted.  There is eventration of the left hemidiaphragm as previously reported.  Minimal bibasilar airspace disease likely reflects atelectasis.  No other airspace consolidation is evident. The visualized soft tissues and bony thorax are unremarkable.  IMPRESSION:  1.  Dextrocardia and right-sided aortic arch, suggesting situs inversus. 2.  Stable eventration of left hemidiaphragm. 3.  Mild bibasilar airspace disease likely reflects atelectasis.  Original Report Authenticated By: Jamesetta Orleans. MATTERN, M.D.   US Abdomen Complete  06/01/2011  *RADIOLOGY REPORT*  Clinical Data:  Nausea, vomiting.  History of situs inversus.  COMPLETE ABDOMINAL ULTRASOUND  Comparison:  None.  Findings: Technically challenging examination secondary patient body habitus, situs inversus, and  limited movement due to a right arm injury.  Gallbladder:  Gallstones are present.  No gallbladder wall thickening or pericholecystic fluid.  Negative sonographic Murphy's sign.  Common bile duct:  Mildly distended up to 8 mm.  No obstructing lesion identified however the distal duct is obscured by overlying bowel gas.  Liver:  Limited acoustic windows.  Suggestion of fatty infiltration.  IVC:  Not visualized.  Pancreas:  No focal abnormality identified within the head/neck. The body and tail are obscured by overlying bowel.  Spleen:  Within normal limits, measuring 8 cm in diameter.  Right Kidney:  Normal sonographic appearance, without hydronephrosis.  Measures 9.6 cm.  Left Kidney:  Normal sonographic appearance, without hydronephrosis.  Measures 9.7 cm.  Abdominal aorta:  Portions were obscured by overlying bowel gas. Where seen, measures up to 2.8 cm in diameter.  IMPRESSION: Technically challenging examination as described above.  Cholelithiasis without definitive sonographic evidence for cholecystitis.  The CBD is mildly distended up to 8 mm.  No obstructing lesion identified however the distal duct is obscured by overlying bowel gas.Correlate with LFTs and ERCP or MRCP if clinically warranted.  Original Report Authenticated By: Waneta Martins, M.D.   Dg Chest Portable 1 View  06/02/2011  *RADIOLOGY REPORT*  Clinical Data: Central line placement  PORTABLE CHEST - 1 VIEW  Comparison: 06/01/2011  Findings: Dextrocardia with right sided aortic arch.  Elevation of left hemidiaphragm.  Overall shallow inspiration.  Interval insertion of right central venous catheter with tip over the left sided SVC.  No pneumothorax.  IMPRESSION: Dextrocardia with right sided aortic arch.  Right central venous catheter with tip over  the mid left sided SVC.  No pneumothorax. Examination is otherwise stable since previous study.  Original Report Authenticated By: Marlon Pel, M.D.    Impression:  1.  Abdominal  pain.  Quiescent at present.  Considerations include biliary colic, choledocholithiasis, cholangitis, pancreatitis. 2.  Fevers and hypotension.  Considerations include pancreatitis (favored) and cholangitis. 3.  Elevated liver tests. 4.  Possible mild pancreatitis on labs/CT. 5.  Situs inversus.  Plan:  1.  Continued medical management with aggressive iv fluids, mindful that she might very well ultimately require intubation. 2.  Would broaden antibiotic coverage with imipenem. 3.  Recheck labs, to assess for trend of leukocytosis, and to reassess for interval change in LFTs and pancreatic enzyme levels. 4.  Based on current available information, would not proceed with urgent ERCP at this time.  However, if her hypotension trend continues, and if LFTs continue to rise and there is no clear evolution into pancreatitis, ERCP might need to be considered. 5.  I have discussed case at length with Dr. Synetta Fail with PCCM. 6.  We will follow closely.   LOS: 1 day   Annaya Bangert M  06/02/2011, 4:35 AM

## 2011-06-02 NOTE — Procedures (Signed)
Central Venous Catheter Insertion Procedure Note NATALEAH SCIONEAUX [id number] [birth year]  Procedure: Insertion of Central Venous Catheter Indications: Assessment of intravascular volume, Drug and/or fluid administration and Frequent blood sampling  Procedure Details Consent: Risks of procedure as well as the alternatives and risks of each were explained to the (patient/caregiver).  Consent for procedure obtained. Time Out: Verified patient identification, verified procedure, site/side was marked, verified correct patient position, special equipment/implants available, medications/allergies/relevent history reviewed, required imaging and test results available.  Performed  Maximum sterile technique was used including antiseptics, cap, gloves, gown, hand hygiene, mask and sheet. Skin prep: Chlorhexidine; local anesthetic administered A antimicrobial bonded/coated triple lumen catheter was placed in the right internal jugular vein using the Seldinger technique.  Evaluation Blood flow good Complications: No apparent complications Patient did tolerate procedure well. Chest X-ray ordered to verify placement.  CXR: pending.  Truett Mcfarlan 06/02/2011, 5:23 AM

## 2011-06-02 NOTE — ED Notes (Signed)
Dr. Synetta Fail at [facility] for central line

## 2011-06-02 NOTE — ED Notes (Signed)
No changes, HR decreased (improved), BP remains low, EDP aware, Critical care MD at [facility]. Family at [facility], pt remains NAD, calm, alert, interactive.

## 2011-06-02 NOTE — ED Provider Notes (Signed)
CRITICAL CARE Performed by: Nelva Nay L   Total critical care time: 60 min.  Critical care time was exclusive of separately billable procedures and treating other patients.  Critical care was necessary to treat or prevent imminent or life-threatening deterioration.  Critical care was time spent personally by me on the following activities: development of treatment plan with patient and/or surrogate as well as nursing, discussions with consultants, evaluation of patient's response to treatment, examination of patient, obtaining history from patient or surrogate, ordering and performing treatments and interventions, ordering and review of laboratory studies, ordering and review of radiographic studies, pulse oximetry and re-evaluation of patient's condition.   I saw and evaluated the patient, reviewed the resident's note and I agree with the findings and plan.   .Face to face Exam:  General:  Awake HEENT:  Atraumatic Resp:  Normal effort Abd:  Tender epigastrum Neuro:No focal weakness Lymph: No adenopathy   Nelia Shi, MD 06/02/11 [phone]

## 2011-06-02 NOTE — ED Notes (Signed)
Continuing with placement of central line, pt tolerating well, VS stable, HR 146, BP 91/44, infusing another 500cc bolus per Dr. Synetta Fail.

## 2011-06-02 NOTE — Progress Notes (Signed)
[name] is an 70 y.o. female.   Chief Complaint: Abdominal pain, hypotension  HPI: Pt is a 70 y/o F withnPMHx of HTN, DL who came to the ER on 40/98 with Hx of 1 day of epigastric abdominal radiated to the back associated with nausea and vomiting. Pt also reported feeling warm, but denied any chills, cough, sputum production or syncopal episodes. Pt was found to have elevated liver enzymes, elevated WBC, lactate 5, RUQ US showed dilated bile duct and cholelithiasis, confirmed on CT abdomen with also mild infiltration of the apncreas with possible early pancreatitis. Pt became hypotensive in the ER in the 80's/50', tachycardic and was given 4L of NS with no improvement. Pt was started on Dopamine through peripheral line and CCM was called.   Lines/tubes  Culture data mrsa screen 12/29: negative UC 12/29:>>> BCX1 12/29>>>  Antibiotics primaxin 12/29 (sepsis/PCN allergy)>>>  Consults GI and gen surg  Events/studies Ct Abdomen Pelvis Wo Contrast  06/01/2011  .  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 Cholelithiasis without obvious bile duct dilatation.  Suggestion of mild infiltration around the head of the pancreas which could represent early changes of pancreatitis.  Laboratory correlation recommended.  Infiltration or atelectasis in both lung bases.  Original Report Authenticated By: Marlon Pel, M.D.  06/01/2011   COMPLETE ABDOMINAL ULTRASOUND  IMPRESSION: Technically challenging examination as described above.  Cholelithiasis without definitive sonographic evidence for cholecystitis.  The CBD is mildly distended up to 8 mm.  No obstructing lesion identified however the distal duct is obscured by overlying bowel gas.Correlate with LFTs and ERCP or MRCP if clinically warranted.  Original Report Authenticated By: Waneta Martins, M.D.  12/29 MRI abd:  1. The study does demonstrate a single small calculus  in the distal common bile duct. There is no significant biliary dilatation.2. Cholelithiasis without evidence of cholecystitis.3. Nonspecific retroperitoneal edema surrounding the pancreatic head could represent early/mild pancreatitis. No focal fluid collection is identified.  Subjective Still c/o nausea  Objective  Temp:  [97.6 F (36.4 C)-102.6 F (39.2 C)] 97.9 F (36.6 C) (12/29 0808) Pulse Rate:  [81-153] 94  (12/29 1000) Resp:  [17-30] 18  (12/29 1000) BP: (64-126)/(37-66) 85/46 mmHg (12/29 0230) SpO2:  [89 %-100 %] 92 % (12/29 1000) Arterial Line BP: (102-126)/(56-64) 102/59 mmHg (12/29 1000) FiO2 (%):  [100 %] 100 % (12/29 0400) Weight:  [91 kg (200 lb 9.9 oz)-101.8 kg (224 lb 6.9 oz)] 224 lb 6.9 oz (101.8 kg) (12/29 0500) CVP:  [6 mmHg-19 mmHg] 18 mmHg    . sodium chloride 150 mL/hr (06/02/11 0606)  . sodium chloride    . Levophed gtt at 110mcg/min    .      Intake/Output Summary (Last 24 hours) at 06/02/11 1447 Last data filed at 06/02/11 1000  Gross per 24 hour  Intake 7279.5 ml  Output      0 ml  Net 7279.5 ml    Physical Exam  Pt is alert, in pain, in no acute resp distress HEENT: no JVD right IJ unremarkable. . CV: tachycardic rhythm, S1 and S2 normal Lungs: decreased breath sounds at the bases with no crackles, rhonchi or wheezes. Abdomen: Tender on palpation in the RUQ and epigastrium. Decreased bowel sounds, no peritoneal signs. Extrem: no low extrem edema, no calf tenderness. Neuro: no focal deficit.    Labs  Lab 06/02/11 0400 06/01/11 1555  NA 141 138  K 3.4* 3.9  CL 109 100  CO2 20 22  BUN 26* 32*  CREATININE 1.50* 1.39*  GLUCOSE 210* 264*    Lab 06/02/11 0400 06/01/11 1555  HGB 8.9* 12.2  HCT 27.6* 36.4  WBC 26.8* 14.9*  PLT 242 319   PCXR: CVL good position, dextrocardia, bibasilar volume loss.    Assessment/Plan  1. Sepsis/Septic shock in setting of presumed bacterial translocation in setting of presumed  Cholangitis. her scvo2  is 67%. Had tachycardia on dopamine so will not add inotrope.   Lab 06/02/11 0400 06/01/11 1555  WBC 26.8* 14.9*    Lab 06/02/11 0400 06/01/11 2305  PROCALCITON 11.79 5.06  plan: -trend wbc and procalcitonin -CVP goal 8-12 -wean pressors for MAP >65 -rx nausea  Mild Gallstone pancreatitis: lipase trending down  Lab 06/02/11 0400 06/01/11 1555  LIPASE 38 69*  Plan: -bowel rest -follow up lipase  ARF: in setting of shock Recent Labs  Basename 06/02/11 0400 06/01/11 1555   CREATININE 1.50* 1.39*  plan: -volume for CVP goals above -strict I&O -avoid nephrotoxins -f/u chemistry  Hypokalemia  Lab 06/02/11 0400 06/01/11 1555  K 3.4* 3.9  plan: -replace and recheck  Anemia  Lab 06/02/11 0400 06/01/11 1555  HGB 8.9* 12.2  plan: -transfuse for hgb goal of > 9 in setting of shock and low/boarderline scvo2  Hyperglycemia CBG (last 3)   Basename 06/02/11 0723  GLUCAP 229*  Plan: ssi protocol  Fractured right elbow Plan -has f/u with ortho on 1/3 -keep in immobilized position for comfort  Best practice DVT prophylaxis:  SCDs   BABCOCK,PETE 06/02/2011, 2:41 PM  CC time of 40 minutes.  Patient seen and examined, agree with above note.  I dictated the care and orders written for this patient under my direction.  Koren Bound, M.D. [phone]

## 2011-06-02 NOTE — Progress Notes (Signed)
Patient ID: [name], female   DOB: [birth year], 70 y.o.   MRN: [id number]  Given increase in LFT's and elevated, I'm worried about cholangitis and a CBD stone.  Patient remains hypotensive. I think she will need an ERCP prior to eventual lap chole

## 2011-06-03 ENCOUNTER — Encounter (HOSPITAL_COMMUNITY): Payer: Self-pay | Admitting: Gastroenterology

## 2011-06-03 ENCOUNTER — Encounter (HOSPITAL_COMMUNITY): Payer: Self-pay | Admitting: Anesthesiology

## 2011-06-03 ENCOUNTER — Inpatient Hospital Stay (HOSPITAL_COMMUNITY): Payer: Medicare Other

## 2011-06-03 ENCOUNTER — Encounter (HOSPITAL_COMMUNITY)
Admission: EM | Disposition: A | Discharge status: Home Health | Payer: Self-pay | Source: Home / Self Care | Attending: Surgery

## 2011-06-03 ENCOUNTER — Inpatient Hospital Stay (HOSPITAL_COMMUNITY): Payer: Medicare Other | Admitting: Anesthesiology

## 2011-06-03 DIAGNOSIS — E872 Acidosis: Secondary | ICD-10-CM

## 2011-06-03 DIAGNOSIS — A419 Sepsis, unspecified organism: Secondary | ICD-10-CM

## 2011-06-03 DIAGNOSIS — I509 Heart failure, unspecified: Secondary | ICD-10-CM

## 2011-06-03 HISTORY — PX: ERCP: SHX5425

## 2011-06-03 LAB — GLUCOSE, CAPILLARY
Glucose-Capillary: 158 mg/dL — ABNORMAL HIGH (ref 70–99)
Glucose-Capillary: 160 mg/dL — ABNORMAL HIGH (ref 70–99)
Glucose-Capillary: 174 mg/dL — ABNORMAL HIGH (ref 70–99)
Glucose-Capillary: 225 mg/dL — ABNORMAL HIGH (ref 70–99)
Glucose-Capillary: 232 mg/dL — ABNORMAL HIGH (ref 70–99)
Glucose-Capillary: 243 mg/dL — ABNORMAL HIGH (ref 70–99)

## 2011-06-03 LAB — COMPREHENSIVE METABOLIC PANEL
Alkaline Phosphatase: 318 U/L — ABNORMAL HIGH (ref 39–117)
BUN: 21 mg/dL (ref 6–23)
Creatinine, Ser: 1.3 mg/dL — ABNORMAL HIGH (ref 0.50–1.10)
GFR calc Af Amer: 47 mL/min — ABNORMAL LOW (ref >90)
Glucose, Bld: 182 mg/dL — ABNORMAL HIGH (ref 70–99)
Potassium: 4.4 mEq/L (ref 3.5–5.1)
Total Bilirubin: 3.2 mg/dL — ABNORMAL HIGH (ref 0.3–1.2)
Total Protein: 5.7 g/dL — ABNORMAL LOW (ref 6.0–8.3)

## 2011-06-03 LAB — TYPE AND SCREEN
ABO/RH(D): AB POS
Unit division: 0

## 2011-06-03 LAB — LIPASE, BLOOD
Lipase: 26 U/L (ref 11–59)
Lipase: 59 U/L (ref 11–59)

## 2011-06-03 LAB — POCT I-STAT 3, ART BLOOD GAS (G3+)
TCO2: 17 mmol/L (ref 0–100)
pCO2 arterial: 36.2 mmHg (ref 35.0–45.0)
pH, Arterial: 7.267 — ABNORMAL LOW (ref 7.350–7.400)
pO2, Arterial: 184 mmHg — ABNORMAL HIGH (ref 80.0–100.0)

## 2011-06-03 LAB — CBC
Hemoglobin: 11.4 g/dL — ABNORMAL LOW (ref 12.0–15.0)
MCHC: 32 g/dL (ref 30.0–36.0)
RDW: 15.4 % (ref 11.5–15.5)
WBC: 25.7 10*3/uL — ABNORMAL HIGH (ref 4.0–10.5)

## 2011-06-03 LAB — BLOOD GAS, ARTERIAL
Bicarbonate: 17.4 mEq/L — ABNORMAL LOW (ref 20.0–24.0)
FIO2: 0.8 %
MECHVT: 500 mL
O2 Saturation: 91.4 %
Patient temperature: 98.6
Pressure support: 10 cmH2O
TCO2: 19.6 mmol/L (ref 0–100)
pO2, Arterial: 82.5 mmHg (ref 80.0–100.0)

## 2011-06-03 LAB — CARDIAC PANEL(CRET KIN+CKTOT+MB+TROPI)
CK, MB: 6.9 ng/mL (ref 0.3–4.0)
Troponin I: 1.04 ng/mL (ref <0.30)

## 2011-06-03 LAB — URINE CULTURE: Culture  Setup Time: 201212291417

## 2011-06-03 SURGERY — ERCP, WITH INTERVENTION IF INDICATED
Anesthesia: Choice

## 2011-06-03 MED ORDER — MIDAZOLAM HCL 2 MG/2ML IJ SOLN
1.0000 mg | INTRAMUSCULAR | Status: DC | PRN
  Administered 2011-06-03 (×2): 1 mg via INTRAVENOUS
  Administered 2011-06-03: 2 mg via INTRAVENOUS
  Administered 2011-06-04: 1 mg via INTRAVENOUS
  Administered 2011-06-04: 2 mg via INTRAVENOUS
  Filled 2011-06-03 (×6): qty 2

## 2011-06-03 MED ORDER — PROMETHAZINE HCL 25 MG/ML IJ SOLN: 6.2500 mg | INTRAMUSCULAR | Status: DC | PRN

## 2011-06-03 MED ORDER — HYDROCORTISONE SOD SUCCINATE 100 MG IJ SOLR
50.0000 mg | Freq: Four times a day (QID) | INTRAMUSCULAR | Status: DC
  Administered 2011-06-03 – 2011-06-05 (×7): 50 mg via INTRAVENOUS
  Filled 2011-06-03 (×12): qty 1

## 2011-06-03 MED ORDER — ROCURONIUM BROMIDE 100 MG/10ML IV SOLN
INTRAVENOUS | Status: DC | PRN
  Administered 2011-06-03: 30 mg via INTRAVENOUS

## 2011-06-03 MED ORDER — SODIUM CHLORIDE 0.9 % IV SOLN
INTRAVENOUS | Status: DC
  Administered 2011-06-03: 3.3 [IU]/h via INTRAVENOUS
  Filled 2011-06-03 (×2): qty 1

## 2011-06-03 MED ORDER — PANTOPRAZOLE SODIUM 40 MG IV SOLR
40.0000 mg | INTRAVENOUS | Status: DC
  Administered 2011-06-03 – 2011-06-08 (×6): 40 mg via INTRAVENOUS
  Filled 2011-06-03 (×7): qty 40

## 2011-06-03 MED ORDER — MORPHINE SULFATE 2 MG/ML IJ SOLN
1.0000 mg | INTRAMUSCULAR | Status: DC | PRN
  Administered 2011-06-03: 2 mg via INTRAVENOUS

## 2011-06-03 MED ORDER — SODIUM CHLORIDE 0.9 % IV SOLN
INTRAVENOUS | Status: DC | PRN
  Administered 2011-06-03: 11:00:00

## 2011-06-03 MED ORDER — SODIUM CHLORIDE 0.9 % IV SOLN
0.0300 [IU]/min | INTRAVENOUS | Status: DC
  Administered 2011-06-03: 0.03 [IU]/min via INTRAVENOUS
  Filled 2011-06-03: qty 2.5

## 2011-06-03 MED ORDER — SUCCINYLCHOLINE CHLORIDE 20 MG/ML IJ SOLN
INTRAMUSCULAR | Status: DC | PRN
  Administered 2011-06-03: 100 mg via INTRAVENOUS

## 2011-06-03 MED ORDER — DEXTROSE-NACL 5-0.9 % IV SOLN
INTRAVENOUS | Status: DC
  Administered 2011-06-03: via INTRAVENOUS

## 2011-06-03 MED ORDER — MEPERIDINE HCL 25 MG/ML IJ SOLN: 6.2500 mg | INTRAMUSCULAR | Status: DC | PRN

## 2011-06-03 MED ORDER — LIDOCAINE HCL (CARDIAC) 20 MG/ML IV SOLN
INTRAVENOUS | Status: DC | PRN
  Administered 2011-06-03: 80 mg via INTRAVENOUS

## 2011-06-03 MED ORDER — MIDAZOLAM HCL 2 MG/2ML IJ SOLN
2.0000 mg | Freq: Once | INTRAMUSCULAR | Status: AC
  Administered 2011-06-03: 2 mg via INTRAVENOUS

## 2011-06-03 MED ORDER — INDOMETHACIN 50 MG RE SUPP
50.0000 mg | Freq: Once | RECTAL | Status: AC
  Administered 2011-06-03: 50 mg via RECTAL
  Filled 2011-06-03 (×2): qty 1

## 2011-06-03 MED ORDER — FENTANYL CITRATE 0.05 MG/ML IJ SOLN
25.0000 ug | INTRAMUSCULAR | Status: DC | PRN
  Administered 2011-06-03 – 2011-06-04 (×3): 50 ug via INTRAVENOUS
  Filled 2011-06-03 (×3): qty 2

## 2011-06-03 MED ORDER — LACTATED RINGERS IV SOLN
INTRAVENOUS | Status: DC | PRN
  Administered 2011-06-03: 09:00:00 via INTRAVENOUS

## 2011-06-03 MED ORDER — ASPIRIN 300 MG RE SUPP
300.0000 mg | Freq: Once | RECTAL | Status: AC
  Administered 2011-06-03: 300 mg via RECTAL

## 2011-06-03 MED ORDER — ALBUTEROL SULFATE HFA 108 (90 BASE) MCG/ACT IN AERS
INHALATION_SPRAY | RESPIRATORY_TRACT | Status: DC | PRN
  Administered 2011-06-03: 2 via RESPIRATORY_TRACT

## 2011-06-03 MED ORDER — NEOSTIGMINE METHYLSULFATE 1 MG/ML IJ SOLN
INTRAMUSCULAR | Status: DC | PRN
  Administered 2011-06-03: 4 mg via INTRAVENOUS
  Administered 2011-06-03: 1 mg via INTRAVENOUS

## 2011-06-03 MED ORDER — PROPOFOL 10 MG/ML IV EMUL
INTRAVENOUS | Status: DC | PRN
  Administered 2011-06-03: 100 mg via INTRAVENOUS
  Administered 2011-06-03: 50 mg via INTRAVENOUS

## 2011-06-03 MED ORDER — FENTANYL CITRATE 0.05 MG/ML IJ SOLN
INTRAMUSCULAR | Status: DC | PRN
  Administered 2011-06-03: 100 ug via INTRAVENOUS

## 2011-06-03 MED ORDER — GLYCOPYRROLATE 0.2 MG/ML IJ SOLN
INTRAMUSCULAR | Status: DC | PRN
  Administered 2011-06-03: .6 mg via INTRAVENOUS

## 2011-06-03 NOTE — Preoperative (Signed)
Beta Blockers   Reason not to administer Beta Blockers:Not Applicable 

## 2011-06-03 NOTE — Progress Notes (Signed)
eLink Physician-Brief Progress Note Patient Name: [name] DOB: February 18, 1941 MRN: [id number]  Date of Service  06/03/2011   HPI/Events of Note   protonix IV ordered   PPI for SUP proph   Intervention Category Intermediate Interventions: Best-practice therapies (e.g. DVT, beta blocker, etc.)  Shan Levans 06/03/2011, 8:40 PM

## 2011-06-03 NOTE — Transfer of Care (Signed)
Immediate Anesthesia Transfer of Care Note  Patient: [name]  Procedure(s) Performed:  ENDOSCOPIC RETROGRADE CHOLANGIOPANCREATOGRAPHY (ERCP)  Patient Location: PACU  Anesthesia Type: General  Level of Consciousness: awake and alert   Airway & Oxygen Therapy: Patient Spontanous Breathing and Patient remains intubated per anesthesia plan  Post-op Assessment: Report given to PACU RN  Post vital signs: Reviewed  Complications: No apparent anesthesia complications

## 2011-06-03 NOTE — Progress Notes (Signed)
Pt. Received from OR on vent, monitor, on levophed.  Dr. Jacklynn Bue to bedside.  PCXR ordered and ABG.  ABG results called to Dr. Jacklynn Bue.  Orders received for vent changes.  Levophed weaned to off due to bps 140s.  PCXR done.  Pt. Returned to 2106 intubated on vent.

## 2011-06-03 NOTE — Anesthesia Preprocedure Evaluation (Signed)
Anesthesia Evaluation  Patient identified by MRN, date of birth, ID band Patient awake  General Assessment Comment:Currently nauseated, Decreased basilar breath sounds  Reviewed: Allergy & Precautions, H&P , NPO status , Patient's Chart, lab work & pertinent test results  History of Anesthesia Complications (+) PONVNegative for: history of anesthetic complications  Airway Mallampati: II      Dental  (+) Edentulous Upper and Edentulous Lower   Pulmonary neg pulmonary ROS, shortness of breath,    + decreased breath sounds      Cardiovascular hypertension, Regular Normal    Neuro/Psych Neuropathy  Neuromuscular disease    GI/Hepatic Neg liver ROS, GERD-  ,  Endo/Other  Negative Endocrine ROS  Renal/GU negative Renal ROS     Musculoskeletal   Abdominal (+) obese,   Peds  Hematology   Anesthesia Other Findings   Reproductive/Obstetrics                           Anesthesia Physical Anesthesia Plan  ASA: III  Anesthesia Plan: General   Post-op Pain Management:    Induction: Intravenous  Airway Management Planned: Oral ETT  Additional Equipment:   Intra-op Plan:   Post-operative Plan: Extubation in OR  Informed Consent: I have reviewed the patients History and Physical, chart, labs and discussed the procedure including the risks, benefits and alternatives for the proposed anesthesia with the patient or authorized representative who has indicated his/her understanding and acceptance.     Plan Discussed with:   Anesthesia Plan Comments:         Anesthesia Quick Evaluation

## 2011-06-03 NOTE — Significant Event (Signed)
CRITICAL VALUE ALERT  Critical value received:  Troponin >1.0  Date of notification: 06/03/11   Time of notification:  1815 /Critical value read back:yes   Nurse who received alert: Turkey Cox-Ingram,RN not called results by lab but noticed the critical value  MD notified (1st page): Dr.Wright  Time of first page:  1815  MD notified (2nd page):  Time of second page:  Responding MD:  Dr.Wright  Time MD responded: [phone]

## 2011-06-03 NOTE — Progress Notes (Signed)
[name] is an 71 y.o. female.   Chief Complaint: Abdominal pain, hypotension  HPI: Pt is a 70 y/o F withnPMHx of HTN, DL who came to the ER on 62/95 with Hx of 1 day of epigastric abdominal radiated to the back associated with nausea and vomiting. Pt also reported feeling warm, but denied any chills, cough, sputum production or syncopal episodes. Pt was found to have elevated liver enzymes, elevated WBC, lactate 5, RUQ US showed dilated bile duct and cholelithiasis, confirmed on CT abdomen with also mild infiltration of the apncreas with possible early pancreatitis. Pt became hypotensive in the ER in the 80's/50', tachycardic and was given 4L of NS with no improvement. Pt was started on Dopamine through peripheral line and CCM was called.   Lines/tubes Right IJ 12/29>>> Oral ETT 12/30>>>  Culture data mrsa screen 12/29: negative UC 12/29:>>> BCX1 12/29>>>  Antibiotics primaxin 12/29 (sepsis/PCN allergy)>>>  Consults GI and gen surg  Events/studies 06/01/2011  .  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 Cholelithiasis without obvious bile duct dilatation.  Suggestion of mild infiltration around the head of the pancreas which could represent early changes of pancreatitis.  Laboratory correlation recommended.  Infiltration or atelectasis in both lung bases.  Original Report Authenticated By: Marlon Pel, M.D.  06/01/2011   COMPLETE ABDOMINAL ULTRASOUND  IMPRESSION: Technically challenging examination as described above.  Cholelithiasis without definitive sonographic evidence for cholecystitis.  The CBD is mildly distended up to 8 mm.  No obstructing lesion identified however the distal duct is obscured by overlying bowel gas.Correlate with LFTs and ERCP or MRCP if clinically warranted.  Original Report Authenticated By: Waneta Martins, M.D.  12/29 MRI abd:  1. The study does demonstrate a single small  calculus in the distal common bile duct. There is no significant biliary dilatation.2. Cholelithiasis without evidence of cholecystitis.3. Nonspecific retroperitoneal edema surrounding the pancreatic head could represent early/mild pancreatitis. No focal fluid collection is identified. ERCP 12/30: Findings: The ERCP does demonstrate a stone in the common bile duct. Sphincterotomy and balloon extraction of the stone were  performed.  IMPRESSION:  Common bile duct stone removed after sphincterotomy.  Subjective Returned to ICU on vent. Currently in shock s/p ERCP.   Objective  Temp:  [97.1 F (36.2 C)-100.4 F (38 C)] 99.7 F (37.6 C) (12/30 1330) Pulse Rate:  [94-156] 109  (12/30 1330) Resp:  [15-31] 29  (12/30 1330) BP: (76-172)/(38-103) 88/70 mmHg (12/30 1310) SpO2:  [91 %-100 %] 100 % (12/30 1330) Arterial Line BP: (99-130)/(81-119) 130/119 mmHg (12/30 0800) FiO2 (%):  [80 %-100 %] 100 % (12/30 1330) Weight:  [105 kg (231 lb 7.7 oz)] 231 lb 7.7 oz (105 kg) (12/30 0500) CVP:  [10 mmHg-14 mmHg] 12 mmHg  . sodium chloride 100 mL/hr at 06/03/11 1234  . sodium chloride    . Levophed gtt 34mcg/min      Intake/Output Summary (Last 24 hours) at 06/03/11 1350 Last data filed at 06/03/11 1305  Gross per 24 hour  Intake 4312.2 ml  Output   1500 ml  Net 2812.2 ml    Physical Exam  Pt is alert, reports pain improved with narcotics HEENT: no JVD right IJ, orally intubated CV: tachycardic rhythm, S1 and S2 normal Lungs: scattered rhonchi Abdomen: Tender on palpation in the RUQ and epigastrium. Decreased bowel sounds, no peritoneal signs. Extrem: no low extrem edema, no calf tenderness. Neuro: no focal deficit.  Labs  Lab 06/03/11 0400 06/02/11 2247 06/02/11 0400  NA 139 138 141  K 4.4 4.3 3.4*  CL 110 112 109  CO2 19 19 20   BUN 21 22 26*  CREATININE 1.30* 1.32* 1.50*  GLUCOSE 182* 146* 210*    Lab 06/03/11 0400 06/02/11 0400 06/01/11 1555  HGB 11.4* 8.9* 12.2  HCT  35.6* 27.6* 36.4  WBC 25.7* 26.8* 14.9*  PLT 258 242 319   PCXR:ETT and CVL in good position. Right effusion, increased bilateral airspace disease.   Assessment/Plan  VDRF: post ERCP, patient was in a prone position and anesthesia elected to keep intubate.  Will keep intubated for today and SBT in AM.  Vent adjusted accordingly.  SIRS/Sepsis/Septic shock in setting of Cholangitis and gallstone pancreatitis requiring high dose levophed s/p ERCP. Suspect SIRS response post-ERCP.   Lab 06/03/11 0400 06/02/11 0400 06/01/11 1555  WBC 25.7* 26.8* 14.9*    Lab 06/02/11 0400 06/01/11 2305  PROCALCITON 11.79 5.06  plan: -trend wbc and procalcitonin -CVP goal 8-12 -wean pressors for MAP >65 -add vasopressin and solucortef  Mild Gallstone pancreatitis: lipase trending down  Lab 06/03/11 0400 06/02/11 0400 06/01/11 1555  LIPASE 26 38 69*  Plan: -bowel rest -follow up lipase  Adrenal insufficiency.  Plan: -add solucortef day 0/7   ARF: in setting of shock: holding stable Recent Labs  Basename 06/03/11 0400 06/02/11 2247 06/02/11 0400   CREATININE 1.30* 1.32* 1.50*  plan: -volume for CVP goals above -strict I&O -avoid nephrotoxins -f/u chemistry  Anemia: got one unit PRBC on 12/29  Lab 06/03/11 0400 06/02/11 0400 06/01/11 1555  HGB 11.4* 8.9* 12.2  plan: -transfuse for hgb goal of > 9 in setting of shock and low/boarderline scvo2  Hyperglycemia CBG (last 3)   Basename 06/03/11 1333 06/03/11 0729 06/03/11 0358  GLUCAP 217* 158* 174*  Plan: ssi protocol  Fractured right elbow Plan -has f/u with ortho on 1/3 -keep in immobilized position for comfort  Best practice DVT prophylaxis:  SCDs  CC time 45 minutes.  Patient seen and examined, agree with above note.  I dictated the care and orders written for this patient under my direction.  Koren Bound, M.D. [phone]

## 2011-06-03 NOTE — Progress Notes (Signed)
Postop [name] had insufficient resp rate , depth , and oxygenation. She remained intubated and initial ABG and O2 Saturations dictate continued intubation postop. CXR is ordered. CCM will be contacted. TMassagee

## 2011-06-03 NOTE — Anesthesia Postprocedure Evaluation (Signed)
  Anesthesia Post-op Note  Patient: [name]  Procedure(s) Performed:  ENDOSCOPIC RETROGRADE CHOLANGIOPANCREATOGRAPHY (ERCP)  Patient Location: PACU  Anesthesia Type: General  Level of Consciousness: awake and sedated  Airway and Oxygen Therapy: Patient remains intubated per anesthesia plan  Post-op Pain: mild  Post-op Assessment: Patient's Cardiovascular Status Stable and RESPIRATORY FUNCTION UNSTABLE  Post-op Vital Signs: stable  Complications: respiratory complications

## 2011-06-03 NOTE — Progress Notes (Signed)
PROCEDURE NOTE  06/01/2011 - 06/03/2011  10:52 AM  PATIENT:  [name]  70 y.o. female  PRE-OPERATIVE DIAGNOSIS: CBD stones POST-OPERATIVE DIAGNOSIS: Same  PROCEDURE: ERCP with sphincterotomy and stone extraction  SURGEON:  Surgeon(s): Petra Kuba, MD Procedure: After the patient was intubated the side-viewing therapeutic video duodenum scope was inserted by in direct vision into the stomach and advanced into the duodenum and a normal appearing ampulla was brought into view. 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 Using the triple-lumen sphincterotome loaded with the JAG Jagwire initially the wire was advanced into the pancreas one time but once we realized that we were in the pancreas the wire was removed we did do 1 minimal injection which minimally filled the pancreas in the CBD and confirmed the stone but the duct was not overfilled. The cystic duct was patent and stones were seen in the gallbladder at the end of the procedure. Deep selective cannulation was obtained with the wire and we proceeded with a sphincterotomy in the customary fashion until we had adequate biliary drainage and to get the fully bowed sphincterotome easily in and out of the duct there were no further pancreatic injections or wires advancement and dye drained readily from the pancreas. We exchanged the sphincterotome for the 12-15 mm adjustable balloon and the stone was delivered on the first balloon pull-through 2 more balloon pull-throughs were normal without any debris sludge or resistance we did since there was no signs of cholangitis proceed with an occlusion cholangiogram which was okay and the balloon was pulled through the pain sphincterotomy site and there seemed to be adequate biliary drainage the wire was withdrawn from the duct and the scope was removed and the patient tolerated the procedure well there was no obvious  immediate complications  ASSESSMENT/FINDINGS:  Successful stone removal  PLAN OF CARE: Observe for delayed complications medical care per ICU team and laparoscopic cholecystectomy when stable

## 2011-06-03 NOTE — Progress Notes (Signed)
Day of Surgery  Subjective: Afebrile for 24hrs. Still on levo. Recd 1uPRBC yesterday.  Small bump in troponin C/o nausea. Denies abd pain. No chest pain/sob. +flatus  Objective: Vital signs in last 24 hours: Temp:  [97.4 F (36.3 C)-99.7 F (37.6 C)] 97.8 F (36.6 C) (12/30 0800) Pulse Rate:  [94-108] 102  (12/30 0816) Resp:  [15-29] 24  (12/30 0816) BP: (112-124)/(83-103) 124/103 mmHg (12/30 0816) SpO2:  [91 %-97 %] 96 % (12/30 0816) Arterial Line BP: (99-130)/(59-119) 130/119 mmHg (12/30 0800) Weight:  [231 lb 7.7 oz (105 kg)] 231 lb 7.7 oz (105 kg) (12/30 0500)    Intake/Output from previous day: 12/29 0701 - 12/30 0700 In: 3974.2 [I.V.:3011.2; Blood:363; IV Piggyback:600] Out: 1675 [Urine:1675] Intake/Output this shift:    On levo  Alert, ox3;  CTA ant Reg Obese, decreased TTP in LUQ. No RT. No guarding. No peritonitis +scds. No edema  Lab Results:   Basename 06/03/11 0400 06/02/11 0400  WBC 25.7* 26.8*  HGB 11.4* 8.9*  HCT 35.6* 27.6*  PLT 258 242   BMET  Basename 06/03/11 0400 06/02/11 2247  NA 139 138  K 4.4 4.3  CL 110 112  CO2 19 19  GLUCOSE 182* 146*  BUN 21 22  CREATININE 1.30* 1.32*  CALCIUM 8.5 8.3*   PT/INR  Basename 06/02/11 0400 06/01/11 1555  LABPROT 16.9* 14.2  INR 1.35 1.08   ABG  Basename 06/02/11 0431 06/02/11 0011  PHART 7.307* --  HCO3 19.4* 20.8    Studies/Results: Ct Abdomen Pelvis Wo Contrast  06/01/2011  *RADIOLOGY REPORT*  Clinical Data: Chest and abdominal pain.  Nausea.  CT ABDOMEN AND PELVIS WITHOUT CONTRAST  Technique:  Multidetector CT imaging of the abdomen and pelvis was performed following the standard protocol without intravenous contrast.  Comparison: None.  Findings: Atelectasis or consolidation in the lung bases. Dextrocardia with right sided aortic arch.  Situs inversus. Cholelithiasis without obvious bile duct dilatation.  Other than the abdominal situs inversus, the unenhanced liver, spleen, stomach,  adrenal glands, kidneys, and retroperitoneal lymph nodes are unremarkable.  Mild calcification of the abdominal aorta without aneurysm.  There is a suggestion of minimal infiltrative change around the head of the pancreas which could represent early focal pancreatitis.  Laboratory correlation is suggested.  No apparent pancreatic ductal dilatation.  No free fluid or free air in the abdomen.  The small bowel are not dilated.  The colon is decompressed without obvious wall thickening.  No free air or free fluid in the abdomen.  Pelvis:  The bladder wall is not thickened.  The uterus appears atrophic.  No abnormal adnexal masses.  No free or loculated pelvic fluid collections.  No inflammatory changes suggested in the sigmoid colon.  The appendix is normal.  Mild degenerative changes in the spine. Soft tissue calcification in the buttocks consistent with injection granulomas.  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 Cholelithiasis without obvious bile duct dilatation.  Suggestion of mild infiltration around the head of the pancreas which could represent early changes of pancreatitis.  Laboratory correlation recommended.  Infiltration or atelectasis in both lung bases.  Original Report Authenticated By: Marlon Pel, M.D.   Dg Chest 2 View  06/01/2011  *RADIOLOGY REPORT*  Clinical Data: Left-sided chest pain.  Shortness of breath.  CHEST - 2 VIEW  Comparison: Report of chest x-ray 04/13/2002 and 06/30/2000.  Findings: Dextrocardia is present.  Right-sided aortic arch is also noted.  There is eventration of the left  hemidiaphragm as previously reported.  Minimal bibasilar airspace disease likely reflects atelectasis.  No other airspace consolidation is evident. The visualized soft tissues and bony thorax are unremarkable.  IMPRESSION:  1.  Dextrocardia and right-sided aortic arch, suggesting situs inversus. 2.  Stable eventration of left hemidiaphragm. 3.  Mild bibasilar  airspace disease likely reflects atelectasis.  Original Report Authenticated By: Jamesetta Orleans. MATTERN, M.D.   US Abdomen Complete  06/01/2011  *RADIOLOGY REPORT*  Clinical Data:  Nausea, vomiting.  History of situs inversus.  COMPLETE ABDOMINAL ULTRASOUND  Comparison:  None.  Findings: Technically challenging examination secondary patient body habitus, situs inversus, and limited movement due to a right arm injury.  Gallbladder:  Gallstones are present.  No gallbladder wall thickening or pericholecystic fluid.  Negative sonographic Murphy's sign.  Common bile duct:  Mildly distended up to 8 mm.  No obstructing lesion identified however the distal duct is obscured by overlying bowel gas.  Liver:  Limited acoustic windows.  Suggestion of fatty infiltration.  IVC:  Not visualized.  Pancreas:  No focal abnormality identified within the head/neck. The body and tail are obscured by overlying bowel.  Spleen:  Within normal limits, measuring 8 cm in diameter.  Right Kidney:  Normal sonographic appearance, without hydronephrosis.  Measures 9.6 cm.  Left Kidney:  Normal sonographic appearance, without hydronephrosis.  Measures 9.7 cm.  Abdominal aorta:  Portions were obscured by overlying bowel gas. Where seen, measures up to 2.8 cm in diameter.  IMPRESSION: Technically challenging examination as described above.  Cholelithiasis without definitive sonographic evidence for cholecystitis.  The CBD is mildly distended up to 8 mm.  No obstructing lesion identified however the distal duct is obscured by overlying bowel gas.Correlate with LFTs and ERCP or MRCP if clinically warranted.  Original Report Authenticated By: Waneta Martins, M.D.   Mr Mrcp  06/02/2011  *RADIOLOGY REPORT*  Clinical Data:  Abdominal pain with nausea, vomiting and elevated liver function tests.  Evaluate for common bile duct stone.  MRI ABDOMEN WITHOUT CONTRAST (MRCP)  Technique:  Multiplanar multisequence MR imaging of the abdomen was  performed.  No intravenous contrast was administered.  Comparison:  Abdominal pelvic CT and abdominal ultrasound 06/01/2011.  MRI ABDOMEN  Findings:  Study is moderately motion degraded.  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  There are multiple small gallstones within the gallbladder lumen.  No significant gallbladder wall thickening is identified.  There is no intrahepatic biliary dilatation.  The common bile duct measures 7 mm in diameter.  Within the distal common bile duct, there is an approximately 5 mm stone which is best seen on the coronal images. No other filling defects are identified.  The pancreatic duct is normal in caliber.  There is no evidence of pancreatic mass or focal surrounding fluid collection. There is some soft tissue edema surrounding the pancreatic head and proximal duodenum.  There is also mild symmetric perinephric soft tissue stranding bilaterally.  The liver, spleen, adrenal glands and kidneys demonstrate no significant findings.  The visualized bowel gas pattern is normal.  There are small bilateral pleural effusions with associated worsening bibasilar atelectasis.  IMPRESSION:  1.  The study does demonstrate a single small calculus in the distal common bile duct.  There is no significant biliary dilatation. 2.  Cholelithiasis without evidence of cholecystitis. 3.  Nonspecific retroperitoneal edema surrounding the pancreatic head could represent early/mild pancreatitis.  No focal fluid collection is identified. 4.  Worsening pleural effusions and bibasilar atelectasis.  5.  Situs inversus.  Original Report Authenticated By: Gerrianne Scale, M.D.   Mr 3d Recon At Scanner  06/02/2011  *RADIOLOGY REPORT*  Clinical Data:  Abdominal pain with nausea, vomiting and elevated liver function tests.  Evaluate for common bile duct stone.  MRI ABDOMEN WITHOUT CONTRAST (MRCP)  Technique:  Multiplanar multisequence MR imaging of the abdomen was performed.  No  intravenous contrast was administered.  Comparison:  Abdominal pelvic CT and abdominal ultrasound 06/01/2011.  MRI ABDOMEN  Findings:  Study is moderately motion degraded.  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  There are multiple small gallstones within the gallbladder lumen.  No significant gallbladder wall thickening is identified.  There is no intrahepatic biliary dilatation.  The common bile duct measures 7 mm in diameter.  Within the distal common bile duct, there is an approximately 5 mm stone which is best seen on the coronal images. No other filling defects are identified.  The pancreatic duct is normal in caliber.  There is no evidence of pancreatic mass or focal surrounding fluid collection. There is some soft tissue edema surrounding the pancreatic head and proximal duodenum.  There is also mild symmetric perinephric soft tissue stranding bilaterally.  The liver, spleen, adrenal glands and kidneys demonstrate no significant findings.  The visualized bowel gas pattern is normal.  There are small bilateral pleural effusions with associated worsening bibasilar atelectasis.  IMPRESSION:  1.  The study does demonstrate a single small calculus in the distal common bile duct.  There is no significant biliary dilatation. 2.  Cholelithiasis without evidence of cholecystitis. 3.  Nonspecific retroperitoneal edema surrounding the pancreatic head could represent early/mild pancreatitis.  No focal fluid collection is identified. 4.  Worsening pleural effusions and bibasilar atelectasis. 5.  Situs inversus.  Original Report Authenticated By: Gerrianne Scale, M.D.   Dg Chest Portable 1 View  06/02/2011  *RADIOLOGY REPORT*  Clinical Data: Central line placement  PORTABLE CHEST - 1 VIEW  Comparison: 06/01/2011  Findings: Dextrocardia with right sided aortic arch.  Elevation of left hemidiaphragm.  Overall shallow inspiration.  Interval insertion of right central venous catheter with tip  over the left sided SVC.  No pneumothorax.  IMPRESSION: Dextrocardia with right sided aortic arch.  Right central venous catheter with tip over the mid left sided SVC.  No pneumothorax. Examination is otherwise stable since previous study.  Original Report Authenticated By: Marlon Pel, M.D.    Anti-infectives: Anti-infectives     Start     Dose/Rate Route Frequency Ordered Stop   06/02/11 2200   imipenem-cilastatin (PRIMAXIN) 500 mg in sodium chloride 0.9 % 100 mL IVPB        500 mg 200 mL/hr over 30 Minutes Intravenous 3 times per day 06/02/11 1226     06/02/11 0600   metroNIDAZOLE (FLAGYL) IVPB 500 mg  Status:  Discontinued        500 mg 100 mL/hr over 60 Minutes Intravenous Every 8 hours 06/02/11 0504 06/02/11 0511   06/02/11 0600   imipenem-cilastatin (PRIMAXIN) 500 mg in sodium chloride 0.9 % 100 mL IVPB  Status:  Discontinued        500 mg 200 mL/hr over 30 Minutes Intravenous 4 times per day 06/02/11 0513 06/02/11 1226   06/02/11 0445   metroNIDAZOLE (FLAGYL) IVPB 500 mg  Status:  Discontinued        500 mg 100 mL/hr over 60 Minutes Intravenous  Once 06/02/11 0431 06/02/11 0432  06/02/11 0430   ertapenem (INVANZ) 1 g in sodium chloride 0.9 % 50 mL IVPB  Status:  Discontinued        1 g 100 mL/hr over 30 Minutes Intravenous Daily 06/02/11 0400 06/02/11 0512   06/01/11 2230   ciprofloxacin (CIPRO) IVPB 400 mg        400 mg 200 mL/hr over 60 Minutes Intravenous  Once 06/01/11 2215 06/02/11 0015   06/01/11 2230   metroNIDAZOLE (FLAGYL) IVPB 500 mg  Status:  Discontinued        500 mg 100 mL/hr over 60 Minutes Intravenous Every 8 hours 06/01/11 2215 06/02/11 0402          Assessment/Plan: s/p Procedure(s): Sepsis/septic shock secondary to cholangitis Choledocholithiasis Cholelithiasis Anemia Situs inversus Obesity +troponin - prob demand ischemia  REC: Cont IV Abx Wean levophed for MAP >62mmHg ERCP for distal CBD Will need eventual cholecystectomy.    Defer +troponin to [facility]. Andrey Campanile, MD, FACS General, Bariatric, & Minimally Invasive Surgery [facility], [place]      LOS: 2 days    Atilano Ina 06/03/2011

## 2011-06-04 ENCOUNTER — Inpatient Hospital Stay (HOSPITAL_COMMUNITY): Payer: Medicare Other

## 2011-06-04 LAB — COMPREHENSIVE METABOLIC PANEL
AST: 32 U/L (ref 0–37)
Albumin: 1.9 g/dL — ABNORMAL LOW (ref 3.5–5.2)
Calcium: 8.9 mg/dL (ref 8.4–10.5)
Chloride: 114 mEq/L — ABNORMAL HIGH (ref 96–112)
Creatinine, Ser: 1.78 mg/dL — ABNORMAL HIGH (ref 0.50–1.10)
Total Protein: 5.6 g/dL — ABNORMAL LOW (ref 6.0–8.3)

## 2011-06-04 LAB — GLUCOSE, CAPILLARY
Glucose-Capillary: 131 mg/dL — ABNORMAL HIGH (ref 70–99)
Glucose-Capillary: 132 mg/dL — ABNORMAL HIGH (ref 70–99)
Glucose-Capillary: 145 mg/dL — ABNORMAL HIGH (ref 70–99)
Glucose-Capillary: 147 mg/dL — ABNORMAL HIGH (ref 70–99)
Glucose-Capillary: 154 mg/dL — ABNORMAL HIGH (ref 70–99)
Glucose-Capillary: 158 mg/dL — ABNORMAL HIGH (ref 70–99)
Glucose-Capillary: 161 mg/dL — ABNORMAL HIGH (ref 70–99)
Glucose-Capillary: 169 mg/dL — ABNORMAL HIGH (ref 70–99)
Glucose-Capillary: 184 mg/dL — ABNORMAL HIGH (ref 70–99)
Glucose-Capillary: 199 mg/dL — ABNORMAL HIGH (ref 70–99)

## 2011-06-04 LAB — CBC
MCH: 29.8 pg (ref 26.0–34.0)
MCHC: 32.7 g/dL (ref 30.0–36.0)
MCV: 91.1 fL (ref 78.0–100.0)
Platelets: 223 10*3/uL (ref 150–400)
RDW: 15.4 % (ref 11.5–15.5)
WBC: 20.3 10*3/uL — ABNORMAL HIGH (ref 4.0–10.5)

## 2011-06-04 LAB — POCT I-STAT 3, ART BLOOD GAS (G3+)
Acid-base deficit: 7 mmol/L — ABNORMAL HIGH (ref 0.0–2.0)
O2 Saturation: 98 %
Patient temperature: 37.7
TCO2: 17 mmol/L (ref 0–100)
pCO2 arterial: 27.4 mmHg — ABNORMAL LOW (ref 35.0–45.0)

## 2011-06-04 MED ORDER — INSULIN ASPART 100 UNIT/ML ~~LOC~~ SOLN
0.0000 [IU] | SUBCUTANEOUS | Status: DC
  Administered 2011-06-04 – 2011-06-06 (×6): 2 [IU] via SUBCUTANEOUS
  Administered 2011-06-07 (×2): 3 [IU] via SUBCUTANEOUS
  Administered 2011-06-07: 5 [IU] via SUBCUTANEOUS
  Administered 2011-06-07: 2 [IU] via SUBCUTANEOUS
  Administered 2011-06-07 – 2011-06-08 (×4): 3 [IU] via SUBCUTANEOUS
  Administered 2011-06-08 (×2): 2 [IU] via SUBCUTANEOUS
  Administered 2011-06-08: 3 [IU] via SUBCUTANEOUS
  Administered 2011-06-09 (×2): 5 [IU] via SUBCUTANEOUS
  Administered 2011-06-09 – 2011-06-10 (×3): 3 [IU] via SUBCUTANEOUS
  Administered 2011-06-10: 5 [IU] via SUBCUTANEOUS
  Administered 2011-06-10 – 2011-06-11 (×2): 2 [IU] via SUBCUTANEOUS
  Administered 2011-06-11: 8 [IU] via SUBCUTANEOUS
  Administered 2011-06-11 (×2): 2 [IU] via SUBCUTANEOUS
  Administered 2011-06-12: 5 [IU] via SUBCUTANEOUS
  Administered 2011-06-12: 3 [IU] via SUBCUTANEOUS
  Administered 2011-06-12: 5 [IU] via SUBCUTANEOUS
  Administered 2011-06-13: 3 [IU] via SUBCUTANEOUS
  Administered 2011-06-13: 5 [IU] via SUBCUTANEOUS
  Administered 2011-06-13: 2 [IU] via SUBCUTANEOUS
  Administered 2011-06-13: 3 [IU] via SUBCUTANEOUS
  Administered 2011-06-13: 2 [IU] via SUBCUTANEOUS
  Filled 2011-06-04: qty 3

## 2011-06-04 MED ORDER — INSULIN GLARGINE 100 UNIT/ML ~~LOC~~ SOLN
10.0000 [IU] | SUBCUTANEOUS | Status: AC
  Administered 2011-06-04: 10 [IU] via SUBCUTANEOUS
  Filled 2011-06-04: qty 3

## 2011-06-04 MED ORDER — SODIUM CHLORIDE 0.9 % IV SOLN
250.0000 mg | Freq: Four times a day (QID) | INTRAVENOUS | Status: DC
  Administered 2011-06-04 – 2011-06-05 (×4): 250 mg via INTRAVENOUS
  Filled 2011-06-04 (×6): qty 250

## 2011-06-04 MED ORDER — SODIUM CHLORIDE 0.9 % IV BOLUS (SEPSIS): 500.0000 mL | INTRAVENOUS | Status: DC | PRN

## 2011-06-04 MED ORDER — IPRATROPIUM BROMIDE 0.02 % IN SOLN
0.5000 mg | Freq: Four times a day (QID) | RESPIRATORY_TRACT | Status: DC
  Administered 2011-06-04 – 2011-06-13 (×30): 0.5 mg via RESPIRATORY_TRACT
  Filled 2011-06-04 (×32): qty 2.5

## 2011-06-04 MED ORDER — HEPARIN SODIUM (PORCINE) 5000 UNIT/ML IJ SOLN
5000.0000 [IU] | Freq: Three times a day (TID) | INTRAMUSCULAR | Status: DC
  Administered 2011-06-04 – 2011-06-11 (×21): 5000 [IU] via SUBCUTANEOUS
  Filled 2011-06-04 (×27): qty 1

## 2011-06-04 MED ORDER — ALBUTEROL SULFATE (5 MG/ML) 0.5% IN NEBU
2.5000 mg | INHALATION_SOLUTION | RESPIRATORY_TRACT | Status: DC | PRN
  Administered 2011-06-12: 2.5 mg via RESPIRATORY_TRACT
  Filled 2011-06-04: qty 0.5

## 2011-06-04 MED ORDER — ALBUTEROL SULFATE (5 MG/ML) 0.5% IN NEBU
2.5000 mg | INHALATION_SOLUTION | Freq: Four times a day (QID) | RESPIRATORY_TRACT | Status: DC
  Administered 2011-06-04 – 2011-06-13 (×30): 2.5 mg via RESPIRATORY_TRACT
  Filled 2011-06-04 (×32): qty 0.5

## 2011-06-04 MED ORDER — DOBUTAMINE IN D5W 4-5 MG/ML-% IV SOLN
2.5000 ug/kg/min | INTRAVENOUS | Status: DC | PRN
  Filled 2011-06-04: qty 250

## 2011-06-04 MED ORDER — INSULIN GLARGINE 100 UNIT/ML ~~LOC~~ SOLN
15.0000 [IU] | Freq: Every day | SUBCUTANEOUS | Status: DC
  Administered 2011-06-04 – 2011-06-05 (×2): 15 [IU] via SUBCUTANEOUS

## 2011-06-04 MED ORDER — SODIUM CHLORIDE 0.45 % IV SOLN
INTRAVENOUS | Status: DC
  Administered 2011-06-04 – 2011-06-06 (×2): via INTRAVENOUS
  Administered 2011-06-08: 1000 mL via INTRAVENOUS
  Administered 2011-06-10 – 2011-06-11 (×2): via INTRAVENOUS

## 2011-06-04 MED ORDER — SODIUM CHLORIDE 0.9 % IV BOLUS (SEPSIS): 500.0000 mL | Freq: Once | INTRAVENOUS | Status: DC

## 2011-06-04 MED ORDER — NOREPINEPHRINE BITARTRATE 1 MG/ML IJ SOLN
2.0000 ug/min | INTRAVENOUS | Status: DC | PRN
  Filled 2011-06-04: qty 8

## 2011-06-04 NOTE — Progress Notes (Signed)
[facility] Progress Note 1 Day Post-Op  Subjective: The patient is very responsive and alert.  Not having a lot of pain.  Has situs inversus.  Getting better slowly.  Objective: Vital signs in last 24 hours: Temp:  [97.1 F (36.2 C)-100.8 F (38.2 C)] 99.5 F (37.5 C) (12/31 0630) Pulse Rate:  [91-156] 95  (12/31 0630) Resp:  [0-31] 29  (12/31 0630) BP: (76-172)/(38-103) 104/77 mmHg (12/31 0630) SpO2:  [91 %-100 %] 98 % (12/31 0630) FiO2 (%):  [40 %-100 %] 40 % (12/31 0630)    Intake/Output from previous day: 12/30 0701 - 12/31 0700 In: 4451.2 [I.V.:3741.2; IV Piggyback:710] Out: 570 [Urine:570] Intake/Output this shift:    General: Does not appear to be in acute distress  Lungs: Clear to auscultation.  Abd: Soft, has some mild epigastric tenderness without rebound or guarding.  Extremities: Edematous globally.  Splint from injury 3 weeks ago on RUE  Neuro: Intact  Lab Results:  @LABLAST2 (wbc:2,hgb:2,hct:2,plt:2) BMET  Basename 06/04/11 0430 06/03/11 0400  NA 140 139  K 4.6 4.4  CL 114* 110  CO2 18* 19  GLUCOSE 143* 182*  BUN 32* 21  CREATININE 1.78* 1.30*  CALCIUM 8.9 8.5   PT/INR  Basename 06/02/11 0400 06/01/11 1555  LABPROT 16.9* 14.2  INR 1.35 1.08   ABG  Basename 06/04/11 0508 06/03/11 1340  PHART 7.385 7.267*  HCO3 16.3* 16.4*    Studies/Results: Mr Mrcp  06/02/2011  *RADIOLOGY REPORT*  Clinical Data:  Abdominal pain with nausea, vomiting and elevated liver function tests.  Evaluate for common bile duct stone.  MRI ABDOMEN WITHOUT CONTRAST (MRCP)  Technique:  Multiplanar multisequence MR imaging of the abdomen was performed.  No intravenous contrast was administered.  Comparison:  Abdominal pelvic CT and abdominal ultrasound 06/01/2011.  MRI ABDOMEN  Findings:  Study is moderately motion degraded.  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  There are multiple small gallstones within the gallbladder lumen.  No significant  gallbladder wall thickening is identified.  There is no intrahepatic biliary dilatation.  The common bile duct measures 7 mm in diameter.  Within the distal common bile duct, there is an approximately 5 mm stone which is best seen on the coronal images. No other filling defects are identified.  The pancreatic duct is normal in caliber.  There is no evidence of pancreatic mass or focal surrounding fluid collection. There is some soft tissue edema surrounding the pancreatic head and proximal duodenum.  There is also mild symmetric perinephric soft tissue stranding bilaterally.  The liver, spleen, adrenal glands and kidneys demonstrate no significant findings.  The visualized bowel gas pattern is normal.  There are small bilateral pleural effusions with associated worsening bibasilar atelectasis.  IMPRESSION:  1.  The study does demonstrate a single small calculus in the distal common bile duct.  There is no significant biliary dilatation. 2.  Cholelithiasis without evidence of cholecystitis. 3.  Nonspecific retroperitoneal edema surrounding the pancreatic head could represent early/mild pancreatitis.  No focal fluid collection is identified. 4.  Worsening pleural effusions and bibasilar atelectasis. 5.  Situs inversus.  Original Report Authenticated By: Gerrianne Scale, M.D.   Mr 3d Recon At Scanner  06/02/2011  *RADIOLOGY REPORT*  Clinical Data:  Abdominal pain with nausea, vomiting and elevated liver function tests.  Evaluate for common bile duct stone.  MRI ABDOMEN WITHOUT CONTRAST (MRCP)  Technique:  Multiplanar multisequence MR imaging of the abdomen was performed.  No intravenous contrast was administered.  Comparison:  Abdominal pelvic CT and abdominal ultrasound 06/01/2011.  MRI ABDOMEN  Findings:  Study is moderately motion degraded.  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  There are multiple small gallstones within the gallbladder lumen.  No significant gallbladder wall  thickening is identified.  There is no intrahepatic biliary dilatation.  The common bile duct measures 7 mm in diameter.  Within the distal common bile duct, there is an approximately 5 mm stone which is best seen on the coronal images. No other filling defects are identified.  The pancreatic duct is normal in caliber.  There is no evidence of pancreatic mass or focal surrounding fluid collection. There is some soft tissue edema surrounding the pancreatic head and proximal duodenum.  There is also mild symmetric perinephric soft tissue stranding bilaterally.  The liver, spleen, adrenal glands and kidneys demonstrate no significant findings.  The visualized bowel gas pattern is normal.  There are small bilateral pleural effusions with associated worsening bibasilar atelectasis.  IMPRESSION:  1.  The study does demonstrate a single small calculus in the distal common bile duct.  There is no significant biliary dilatation. 2.  Cholelithiasis without evidence of cholecystitis. 3.  Nonspecific retroperitoneal edema surrounding the pancreatic head could represent early/mild pancreatitis.  No focal fluid collection is identified. 4.  Worsening pleural effusions and bibasilar atelectasis. 5.  Situs inversus.  Original Report Authenticated By: Gerrianne Scale, M.D.   Dg Chest Port 1 View  06/04/2011  *RADIOLOGY REPORT*  Clinical Data: 70 year old female - respiratory failure.  Evaluate endotracheal tube placement.  PORTABLE CHEST - 1 VIEW  Comparison: 06/03/2011 and prior chest radiographs dating back to 06/01/2011  Findings: An endotracheal tube is again identified with tip 4 cm above the carina. Pulmonary vascular congestion and pulmonary edema have slightly increased.  Dextrocardia and right aortic arch are again noted. A right IJ central venous catheter is unchanged with tip overlying the mid SVC on the left. Left lower lung consolidation/atelectasis is unchanged. There is no evidence of pneumothorax.  IMPRESSION:  Slightly increased pulmonary vascular congestion/edema without other significant change.  Continued left lower lung atelectasis/consolidation.  Original Report Authenticated By: Rosendo Gros, M.D.   Dg Chest Port 1 View  06/03/2011  *RADIOLOGY REPORT*  Clinical Data: Status post ERCP.  Interval intubation.  PORTABLE CHEST - 1 VIEW  Comparison: Portal chest 06/03/2011.  Findings: An endotracheal tube has in place.  The tip is 4.7 cm above the carina, in satisfactory position.  The right IJ line is stable position.  Dextrocardia is again noted.  Mild interstitial edema is stable.  A right pleural effusion is unchanged.  IMPRESSION:  1.  Interval intubation without radiographic evidence for complication. 2.  Stable appearance of interstitial edema and right pleural effusion.  Original Report Authenticated By: Jamesetta Orleans. MATTERN, M.D.   Dg Chest Port 1 View  06/03/2011  *RADIOLOGY REPORT*  Clinical Data: Cough.  Vomiting.  PORTABLE CHEST - 1 VIEW  Comparison: 12/29 and 06/01/2011  Findings: Since prior study the patient has developed diffuse accentuation of the interstitial markings consistent with interstitial pulmonary edema.  Again noted is dextrocardia.  Central venous catheter is unchanged. Increased atelectasis at the bases particularly on the right.  New small right effusion.  IMPRESSION: New pulmonary edema and right effusion.  Increased atelectasis at the bases.  Original Report Authenticated By: Gwynn Burly, M.D.   Dg Ercp With Sphincterotomy  06/03/2011  *RADIOLOGY REPORT*  Clinical Data: Common bile  duct stones.  Situs inversus.  ERCP  Comparison:  MRCP dated 06/02/2011  Technique:  Multiple spot images obtained with the fluoroscopic device and submitted for interpretation post-procedure.  ERCP was performed by Dr. Ewing Schlein.  Findings: The ERCP does demonstrate a stone in the common bile duct.  Sphincterotomy and balloon extraction of the stone were performed.  IMPRESSION: Common bile duct  stone removed after sphincterotomy.  These images were submitted for radiologic interpretation only. Please see the procedural report for the amount of contrast and the fluoroscopy time utilized.  Original Report Authenticated By: Gwynn Burly, M.D.    Anti-infectives: Anti-infectives     Start     Dose/Rate Route Frequency Ordered Stop   06/02/11 2200   imipenem-cilastatin (PRIMAXIN) 500 mg in sodium chloride 0.9 % 100 mL IVPB        500 mg 200 mL/hr over 30 Minutes Intravenous 3 times per day 06/02/11 1226     06/02/11 0600   metroNIDAZOLE (FLAGYL) IVPB 500 mg  Status:  Discontinued        500 mg 100 mL/hr over 60 Minutes Intravenous Every 8 hours 06/02/11 0504 06/02/11 0511   06/02/11 0600   imipenem-cilastatin (PRIMAXIN) 500 mg in sodium chloride 0.9 % 100 mL IVPB  Status:  Discontinued        500 mg 200 mL/hr over 30 Minutes Intravenous 4 times per day 06/02/11 0513 06/02/11 1226   06/02/11 0445   metroNIDAZOLE (FLAGYL) IVPB 500 mg  Status:  Discontinued        500 mg 100 mL/hr over 60 Minutes Intravenous  Once 06/02/11 0431 06/02/11 0432   06/02/11 0430   ertapenem (INVANZ) 1 g in sodium chloride 0.9 % 50 mL IVPB  Status:  Discontinued        1 g 100 mL/hr over 30 Minutes Intravenous Daily 06/02/11 0400 06/02/11 0512   06/01/11 2230   ciprofloxacin (CIPRO) IVPB 400 mg        400 mg 200 mL/hr over 60 Minutes Intravenous  Once 06/01/11 2215 06/02/11 0015   06/01/11 2230   metroNIDAZOLE (FLAGYL) IVPB 500 mg  Status:  Discontinued        500 mg 100 mL/hr over 60 Minutes Intravenous Every 8 hours 06/01/11 2215 06/02/11 0402          Assessment/Plan: s/p Procedure(s): ENDOSCOPIC RETROGRADE CHOLANGIOPANCREATOGRAPHY (ERCP) The patient's WBC is improving after ERCP with sphincterotomy and stone extraction, no pus in duct. With her clinical improvement one would expect that she did indeed have cholangiitis. Vomited bilious contents onto her neck, I believe that and OGT will  help Not a candidate for surgery anytime very soon.  Still on pressors but can likely be weaned off. Weaning from the ventilator can be done as tolerated.  No need to keep her intubated from a surgical standpoint.  LOS: 3 days   Marta Lamas. Gae Bon, MD, FACS [phone] [facility] Surgery 06/04/2011

## 2011-06-04 NOTE — Procedures (Signed)
Extubation Procedure Note  Patient Details:   Name: [name] DOB: 04/05/1941 MRN: [id number]   Airway Documentation:     Evaluation  O2 sats: stable throughout Complications: No apparent complications Patient did tolerate procedure well. Bilateral Breath Sounds: Rhonchi Suctioning: Airway Yes: ability to cough and speak.  Pt has adequate cough, placed on 4L [place], RR 15, HR 96, spo2 93%, b/p 98/63.  Pt. Tolerating well at this time.  RT will monitor. Gerome Apley 06/04/2011, 11:03 AM

## 2011-06-04 NOTE — Progress Notes (Signed)
eLink Physician-Brief Progress Note Patient Name: [name] DOB: 03-29-1941 MRN: [id number]  Date of Service  06/04/2011   HPI/Events of Note   Oliguric, CVP 10, on vasopressors  eICU Interventions  See orders for NS bolus   Intervention Category Intermediate Interventions: Oliguria - evaluation and management  Shan Levans 06/04/2011, 4:56 AM

## 2011-06-04 NOTE — Progress Notes (Signed)
[name] is an 70 y.o. female.   Chief Complaint: Abdominal pain, hypotension  HPI: Pt is a 70 y/o F withnPMHx of HTN, DL who came to the ER on 13/08 with Hx of 1 day of epigastric abdominal radiated to the back associated with nausea and vomiting. Pt also reported feeling warm, but denied any chills, cough, sputum production or syncopal episodes. Pt was found to have elevated liver enzymes, elevated WBC, lactate 5, RUQ US showed dilated bile duct and cholelithiasis, confirmed on CT abdomen with also mild infiltration of the apncreas with possible early pancreatitis. Pt became hypotensive in the ER in the 80's/50', tachycardic and was given 4L of NS with no improvement. Pt was started on Dopamine through peripheral line and CCM was called.   Lines/tubes Right IJ 12/29>>> Oral ETT 12/30>>>  Culture data mrsa screen 12/29: negative UC 12/29:>>> BCX1 12/29>>>  Antibiotics primaxin 12/29 (sepsis/PCN allergy)>>>  Consults GI and gen surg  Events/studies 06/01/2011  .  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 Cholelithiasis without obvious bile duct dilatation.  Suggestion of mild infiltration around the head of the pancreas which could represent early changes of pancreatitis.  Laboratory correlation recommended.  Infiltration or atelectasis in both lung bases.  Original Report Authenticated By: Marlon Pel, M.D.  06/01/2011   COMPLETE ABDOMINAL ULTRASOUND  IMPRESSION: Technically challenging examination as described above.  Cholelithiasis without definitive sonographic evidence for cholecystitis.  The CBD is mildly distended up to 8 mm.  No obstructing lesion identified however the distal duct is obscured by overlying bowel gas.Correlate with LFTs and ERCP or MRCP if clinically warranted.  Original Report Authenticated By: Waneta Martins, M.D.  12/29 MRI abd:  1. The study does demonstrate a single small  calculus in the distal common bile duct. There is no significant biliary dilatation.2. Cholelithiasis without evidence of cholecystitis.3. Nonspecific retroperitoneal edema surrounding the pancreatic head could represent early/mild pancreatitis. No focal fluid collection is identified. ERCP 12/30: Findings: The ERCP does demonstrate a stone in the common bile duct. Sphincterotomy and balloon extraction of the stone were  performed.  IMPRESSION:  Common bile duct stone removed after sphincterotomy.  Subjective Improved , pressors reducing  Objective  Temp:  [97.1 F (36.2 C)-100.8 F (38.2 C)] 99.1 F (37.3 C) (12/31 0930) Pulse Rate:  [87-156] 90  (12/31 0930) Resp:  [0-31] 27  (12/31 0930) BP: (76-172)/(38-96) 91/47 mmHg (12/31 0930) SpO2:  [91 %-100 %] 99 % (12/31 0930) FiO2 (%):  [40 %-100 %] 40 % (12/31 0930) CVP:  [10 mmHg-290 mmHg] 270 mmHg  . sodium chloride 100 mL/hr at 06/03/11 1234  . sodium chloride    . Levophed gtt 68mcg/min      Intake/Output Summary (Last 24 hours) at 06/04/11 1011 Last data filed at 06/04/11 0900  Gross per 24 hour  Intake 4896.6 ml  Output    475 ml  Net 4421.6 ml    Physical Exam  Pt is alert, reports pain improved with narcotics HEENT: no JVD right IJ, orally intubated CV: tachycardic rhythm, S1 and S2 normal, HR sounds on rt Lungs: scattered rhonchi Abdomen: Tender on palpation in the LUQ and epigastrium. Decreased bowel sounds, no peritoneal signs. Extrem: no low extrem edema, no calf tenderness. Neuro: no focal deficit.    Labs  Lab 06/04/11 0430 06/03/11 0400 06/02/11 2247  NA 140 139 138  K 4.6 4.4 4.3  CL 114* 110 112  CO2 18* 19 19  BUN 32* 21 22  CREATININE 1.78* 1.30* 1.32*  GLUCOSE 143* 182* 146*    Lab 06/04/11 0430 06/03/11 0400 06/02/11 0400  HGB 11.1* 11.4* 8.9*  HCT 33.9* 35.6* 27.6*  WBC 20.3* 25.7* 26.8*  PLT 223 258 242   PCXR:ETT and CVL in good position. Right effusion, increased bilateral airspace  disease.   Assessment/Plan  VDRF: post ERCP, patient was in a prone position and anesthesia elected to keep intubate.  abg reviewed, now resolved pH pcxr some edema Wean this am cpap5 ps 5 goal 30 min , assess rsbi limit gross volume overload as pressors are improved and cvp 15, lower rate pcxr in am   SIRS/Sepsis/Septic shock in setting of Cholangitis and gallstone pancreatitis requiring high dose levophed s/p ERCP.  Much improved pressors S/p EGDT cvp 15,  Drop volume Dc vaso Wean levo to lower MAP goal to 60  Stress steroids received, met criteria for Rel AI, maintain until off pressors  Lab 06/04/11 0430 06/03/11 0400 06/02/11 0400  WBC 20.3* 25.7* 26.8*    Lab 06/04/11 0430 06/02/11 0400 06/01/11 2305  PROCALCITON 12.91 11.79 5.06    Hypercjloremia Change to 1/2 NS  Cholecysttitis, Cholangitis Enzymes and bili trending down Improved clinicaly, pressors etc Npo ppi   Adrenal insufficiency.  Plan: Stress roids until off pressors then taper over 3 days to off  ARF: in setting of shock: ATN likley Recent Labs  [facility] 06/04/11 0430 06/03/11 0400 06/02/11 2247   CREATININE 1.78* 1.30* 1.32*  plan: -volume for CVP goals above, met -strict I&O -avoid nephrotoxins -f/u chemistry in am  cvp 15, edema onpcxr, kvo To 1/2 NS  Anemia: got one unit PRBC on 12/29  Lab 06/04/11 0430 06/03/11 0400 06/02/11 0400  HGB 11.1* 11.4* 8.9*  plan: -transfuse for hgb goal of > 7 Add sub q hep  Hyperglycemia CBG (last 3)   Basename 06/04/11 0625 06/04/11 0533 06/04/11 0441  GLUCAP 161* 145* 138*  Plan: ssi protocol add lantus given to transition off  Fractured right elbow Plan -has f/u with ortho on 1/3 -keep in immobilized position for comfort  Best practice DVT prophylaxis:  SCDs  CC time 35 minutes.  Mcarthur Rossetti. Tyson Alias, MD, FACP Pgr: [phone] [facility]   Nelda Bucks., M.D. [phone]

## 2011-06-04 NOTE — Progress Notes (Signed)
Subjective: Still intubated post-ERCP. Tells me her abdominal pain is less. Some emesis.  Objective: Vital signs in last 24 hours: Temp:  [97.1 F (36.2 C)-100.8 F (38.2 C)] 99.5 F (37.5 C) (12/31 0630) Pulse Rate:  [91-156] 95  (12/31 0630) Resp:  [0-31] 29  (12/31 0630) BP: (76-172)/(38-96) 104/77 mmHg (12/31 0630) SpO2:  [91 %-100 %] 98 % (12/31 0630) FiO2 (%):  [40 %-100 %] 40 % (12/31 0630) Weight change:     PE: GEN:  Intubated, but awake and can answer questions HEENT:  Dry MM, faint scleral icterus ABD:  LUQ tenderness much improved, hypoactive bowel sounds, soft EXT:  Edematous  Lab Results: Results for orders placed during the hospital encounter of 06/01/11 (from the past 48 hour(s))  MRSA PCR SCREENING     Status: Normal   Collection Time   06/02/11  8:40 AM      Component Value Range Comment   MRSA by PCR NEGATIVE  NEGATIVE    CARBOXYHEMOGLOBIN     Status: Abnormal   Collection Time   06/02/11  8:42 AM      Component Value Range Comment   Total hemoglobin 11.9 (*) 12.5 - 16.0 (g/dL)    O2 Saturation 69.6      Carboxyhemoglobin 1.1  0.5 - 1.5 (%)    Methemoglobin 1.1  0.0 - 1.5 (%)   GLUCOSE, CAPILLARY     Status: Abnormal   Collection Time   06/02/11  3:34 PM      Component Value Range Comment   Glucose-Capillary 170 (*) 70 - 99 (mg/dL)   PREPARE RBC (CROSSMATCH)     Status: Normal   Collection Time   06/02/11  4:00 PM      Component Value Range Comment   Order Confirmation ORDER PROCESSED BY BLOOD BANK     CARDIAC PANEL(CRET KIN+CKTOT+MB+TROPI)     Status: Abnormal   Collection Time   06/02/11  4:03 PM      Component Value Range Comment   Total CK 1237 (*) 7 - 177 (U/L)    CK, MB 14.6 (*) 0.3 - 4.0 (ng/mL)    Troponin I 0.48 (*) <0.30 (ng/mL)    Relative Index 1.2  0.0 - 2.5    GLUCOSE, CAPILLARY     Status: Abnormal   Collection Time   06/02/11  7:33 PM      Component Value Range Comment   Glucose-Capillary 136 (*) 70 - 99 (mg/dL)   BASIC  METABOLIC PANEL     Status: Abnormal   Collection Time   06/02/11 10:47 PM      Component Value Range Comment   Sodium 138  135 - 145 (mEq/L)    Potassium 4.3  3.5 - 5.1 (mEq/L)    Chloride 112  96 - 112 (mEq/L)    CO2 19  19 - 32 (mEq/L)    Glucose, Bld 146 (*) 70 - 99 (mg/dL)    BUN 22  6 - 23 (mg/dL)    Creatinine, Ser 2.95 (*) 0.50 - 1.10 (mg/dL)    Calcium 8.3 (*) 8.4 - 10.5 (mg/dL)    GFR calc non Af Amer 40 (*) >90 (mL/min)    GFR calc Af Amer 47 (*) >90 (mL/min)   CULTURE, SPUTUM-ASSESSMENT     Status: Normal   Collection Time   06/02/11 11:05 PM      Component Value Range Comment   Specimen Description SPUTUM      Special Requests NONE  Sputum evaluation        Value: MICROSCOPIC FINDINGS SUGGEST THAT THIS SPECIMEN IS NOT REPRESENTATIVE OF LOWER RESPIRATORY SECRETIONS. PLEASE RECOLLECT.     CALLED TO Lianne Bushy RN AT [phone] ON [id number]   Report Status 06/02/2011 FINAL     GLUCOSE, CAPILLARY     Status: Abnormal   Collection Time   06/02/11 11:54 PM      Component Value Range Comment   Glucose-Capillary 160 (*) 70 - 99 (mg/dL)   GLUCOSE, CAPILLARY     Status: Abnormal   Collection Time   06/03/11  3:58 AM      Component Value Range Comment   Glucose-Capillary 174 (*) 70 - 99 (mg/dL)   COMPREHENSIVE METABOLIC PANEL     Status: Abnormal   Collection Time   06/03/11  4:00 AM      Component Value Range Comment   Sodium 139  135 - 145 (mEq/L)    Potassium 4.4  3.5 - 5.1 (mEq/L)    Chloride 110  96 - 112 (mEq/L)    CO2 19  19 - 32 (mEq/L)    Glucose, Bld 182 (*) 70 - 99 (mg/dL)    BUN 21  6 - 23 (mg/dL)    Creatinine, Ser 0.45 (*) 0.50 - 1.10 (mg/dL)    Calcium 8.5  8.4 - 10.5 (mg/dL)    Total Protein 5.7 (*) 6.0 - 8.3 (g/dL)    Albumin 2.1 (*) 3.5 - 5.2 (g/dL)    AST 66 (*) 0 - 37 (U/L)    ALT 103 (*) 0 - 35 (U/L)    Alkaline Phosphatase 318 (*) 39 - 117 (U/L)    Total Bilirubin 3.2 (*) 0.3 - 1.2 (mg/dL)    GFR calc non Af Amer 41 (*) >90 (mL/min)    GFR calc Af  Amer 47 (*) >90 (mL/min)   LIPASE, BLOOD     Status: Normal   Collection Time   06/03/11  4:00 AM      Component Value Range Comment   Lipase 26  11 - 59 (U/L)   CBC     Status: Abnormal   Collection Time   06/03/11  4:00 AM      Component Value Range Comment   WBC 25.7 (*) 4.0 - 10.5 (K/uL)    RBC 3.86 (*) 3.87 - 5.11 (MIL/uL)    Hemoglobin 11.4 (*) 12.0 - 15.0 (g/dL) POST TRANSFUSION SPECIMEN   HCT 35.6 (*) 36.0 - 46.0 (%)    MCV 92.2  78.0 - 100.0 (fL)    MCH 29.5  26.0 - 34.0 (pg)    MCHC 32.0  30.0 - 36.0 (g/dL)    RDW 40.9  81.1 - 91.4 (%)    Platelets 258  150 - 400 (K/uL)   GLUCOSE, CAPILLARY     Status: Abnormal   Collection Time   06/03/11  7:29 AM      Component Value Range Comment   Glucose-Capillary 158 (*) 70 - 99 (mg/dL)   BLOOD GAS, ARTERIAL     Status: Abnormal   Collection Time   06/03/11 11:25 AM      Component Value Range Comment   FIO2 0.80      Delivery systems VENTILATOR      Mode SYNCRONIZED INTERMITTENT MANDATORY VENTILATION      VT 500      Rate 10      Peep/cpap 5.0      Pressure support 10      pH,  Arterial 7.025 (*) 7.350 - 7.400     pCO2 arterial 70.1 (*) 35.0 - 45.0 (mmHg)    pO2, Arterial 82.5  80.0 - 100.0 (mmHg)    Bicarbonate 17.4 (*) 20.0 - 24.0 (mEq/L)    TCO2 19.6  0 - 100 (mmol/L)    Acid-base deficit 11.8 (*) 0.0 - 2.0 (mmol/L)    O2 Saturation 91.4      Patient temperature 98.6      Collection site LEFT RADIAL      Drawn by COLLECTED BY RT      Sample type ARTERIAL DRAW      Allens test (pass/fail) PASS  PASS    GLUCOSE, CAPILLARY     Status: Abnormal   Collection Time   06/03/11  1:33 PM      Component Value Range Comment   Glucose-Capillary 217 (*) 70 - 99 (mg/dL)   POCT I-STAT 3, BLOOD GAS (G3+)     Status: Abnormal   Collection Time   06/03/11  1:40 PM      Component Value Range Comment   pH, Arterial 7.267 (*) 7.350 - 7.400     pCO2 arterial 36.2  35.0 - 45.0 (mmHg)    pO2, Arterial 184.0 (*) 80.0 - 100.0 (mmHg)      Bicarbonate 16.4 (*) 20.0 - 24.0 (mEq/L)    TCO2 17  0 - 100 (mmol/L)    O2 Saturation 99.0      Acid-base deficit 10.0 (*) 0.0 - 2.0 (mmol/L)    Patient temperature 37.6 C      Collection site RADIAL, ALLEN'S TEST ACCEPTABLE      Drawn by RT      Sample type ARTERIAL     LIPASE, BLOOD     Status: Normal   Collection Time   06/03/11  3:30 PM      Component Value Range Comment   Lipase 59  11 - 59 (U/L)   CARDIAC PANEL(CRET KIN+CKTOT+MB+TROPI)     Status: Abnormal   Collection Time   06/03/11  3:30 PM      Component Value Range Comment   Total CK 290 (*) 7 - 177 (U/L)    CK, MB 6.9 (*) 0.3 - 4.0 (ng/mL) CRITICAL VALUE NOTED.  VALUE IS CONSISTENT WITH PREVIOUSLY REPORTED AND CALLED VALUE.   Troponin I 1.04 (*) <0.30 (ng/mL)    Relative Index 2.4  0.0 - 2.5    GLUCOSE, CAPILLARY     Status: Abnormal   Collection Time   06/03/11  3:51 PM      Component Value Range Comment   Glucose-Capillary 233 (*) 70 - 99 (mg/dL)   GLUCOSE, CAPILLARY     Status: Abnormal   Collection Time   06/03/11  5:35 PM      Component Value Range Comment   Glucose-Capillary 225 (*) 70 - 99 (mg/dL)   GLUCOSE, CAPILLARY     Status: Abnormal   Collection Time   06/03/11  6:41 PM      Component Value Range Comment   Glucose-Capillary 243 (*) 70 - 99 (mg/dL)   GLUCOSE, CAPILLARY     Status: Abnormal   Collection Time   06/03/11  7:41 PM      Component Value Range Comment   Glucose-Capillary 232 (*) 70 - 99 (mg/dL)   GLUCOSE, CAPILLARY     Status: Abnormal   Collection Time   06/03/11  8:57 PM      Component Value Range Comment   Glucose-Capillary 199 (*)  70 - 99 (mg/dL)   GLUCOSE, CAPILLARY     Status: Abnormal   Collection Time   06/03/11 10:25 PM      Component Value Range Comment   Glucose-Capillary 169 (*) 70 - 99 (mg/dL)   GLUCOSE, CAPILLARY     Status: Abnormal   Collection Time   06/03/11 11:36 PM      Component Value Range Comment   Glucose-Capillary 158 (*) 70 - 99 (mg/dL)   GLUCOSE,  CAPILLARY     Status: Abnormal   Collection Time   06/04/11 12:31 AM      Component Value Range Comment   Glucose-Capillary 171 (*) 70 - 99 (mg/dL)   GLUCOSE, CAPILLARY     Status: Abnormal   Collection Time   06/04/11  1:27 AM      Component Value Range Comment   Glucose-Capillary 177 (*) 70 - 99 (mg/dL)   GLUCOSE, CAPILLARY     Status: Abnormal   Collection Time   06/04/11  2:27 AM      Component Value Range Comment   Glucose-Capillary 184 (*) 70 - 99 (mg/dL)   GLUCOSE, CAPILLARY     Status: Abnormal   Collection Time   06/04/11  3:35 AM      Component Value Range Comment   Glucose-Capillary 150 (*) 70 - 99 (mg/dL)   CBC     Status: Abnormal   Collection Time   06/04/11  4:30 AM      Component Value Range Comment   WBC 20.3 (*) 4.0 - 10.5 (K/uL)    RBC 3.72 (*) 3.87 - 5.11 (MIL/uL)    Hemoglobin 11.1 (*) 12.0 - 15.0 (g/dL)    HCT 04.5 (*) 40.9 - 46.0 (%)    MCV 91.1  78.0 - 100.0 (fL)    MCH 29.8  26.0 - 34.0 (pg)    MCHC 32.7  30.0 - 36.0 (g/dL)    RDW 81.1  91.4 - 78.2 (%)    Platelets 223  150 - 400 (K/uL)   COMPREHENSIVE METABOLIC PANEL     Status: Abnormal   Collection Time   06/04/11  4:30 AM      Component Value Range Comment   Sodium 140  135 - 145 (mEq/L)    Potassium 4.6  3.5 - 5.1 (mEq/L)    Chloride 114 (*) 96 - 112 (mEq/L)    CO2 18 (*) 19 - 32 (mEq/L)    Glucose, Bld 143 (*) 70 - 99 (mg/dL)    BUN 32 (*) 6 - 23 (mg/dL) DELTA CHECK NOTED   Creatinine, Ser 1.78 (*) 0.50 - 1.10 (mg/dL)    Calcium 8.9  8.4 - 10.5 (mg/dL)    Total Protein 5.6 (*) 6.0 - 8.3 (g/dL)    Albumin 1.9 (*) 3.5 - 5.2 (g/dL)    AST 32  0 - 37 (U/L)    ALT 71 (*) 0 - 35 (U/L)    Alkaline Phosphatase 270 (*) 39 - 117 (U/L)    Total Bilirubin 1.3 (*) 0.3 - 1.2 (mg/dL)    GFR calc non Af Amer 28 (*) >90 (mL/min)    GFR calc Af Amer 32 (*) >90 (mL/min)   LIPASE, BLOOD     Status: Normal   Collection Time   06/04/11  4:30 AM      Component Value Range Comment   Lipase 29  11 - 59  (U/L)   PROCALCITONIN     Status: Normal   Collection Time  06/04/11  4:30 AM      Component Value Range Comment   Procalcitonin 12.91     GLUCOSE, CAPILLARY     Status: Abnormal   Collection Time   06/04/11  4:41 AM      Component Value Range Comment   Glucose-Capillary 138 (*) 70 - 99 (mg/dL)   POCT I-STAT 3, BLOOD GAS (G3+)     Status: Abnormal   Collection Time   06/04/11  5:08 AM      Component Value Range Comment   pH, Arterial 7.385  7.350 - 7.400     pCO2 arterial 27.4 (*) 35.0 - 45.0 (mmHg)    pO2, Arterial 113.0 (*) 80.0 - 100.0 (mmHg)    Bicarbonate 16.3 (*) 20.0 - 24.0 (mEq/L)    TCO2 17  0 - 100 (mmol/L)    O2 Saturation 98.0      Acid-base deficit 7.0 (*) 0.0 - 2.0 (mmol/L)    Patient temperature 37.7 C      Collection site RADIAL, ALLEN'S TEST ACCEPTABLE      Drawn by RT      Sample type ARTERIAL     GLUCOSE, CAPILLARY     Status: Abnormal   Collection Time   06/04/11  5:33 AM      Component Value Range Comment   Glucose-Capillary 145 (*) 70 - 99 (mg/dL)   GLUCOSE, CAPILLARY     Status: Abnormal   Collection Time   06/04/11  6:25 AM      Component Value Range Comment   Glucose-Capillary 161 (*) 70 - 99 (mg/dL)     Studies/Results: Mr Mrcp  06/02/2011  *RADIOLOGY REPORT*  Clinical Data:  Abdominal pain with nausea, vomiting and elevated liver function tests.  Evaluate for common bile duct stone.  MRI ABDOMEN WITHOUT CONTRAST (MRCP)  Technique:  Multiplanar multisequence MR imaging of the abdomen was performed.  No intravenous contrast was administered.  Comparison:  Abdominal pelvic CT and abdominal ultrasound 06/01/2011.  MRI ABDOMEN  Findings:  Study is moderately motion degraded.  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  There are multiple small gallstones within the gallbladder lumen.  No significant gallbladder wall thickening is identified.  There is no intrahepatic biliary dilatation.  The common bile duct measures 7 mm in  diameter.  Within the distal common bile duct, there is an approximately 5 mm stone which is best seen on the coronal images. No other filling defects are identified.  The pancreatic duct is normal in caliber.  There is no evidence of pancreatic mass or focal surrounding fluid collection. There is some soft tissue edema surrounding the pancreatic head and proximal duodenum.  There is also mild symmetric perinephric soft tissue stranding bilaterally.  The liver, spleen, adrenal glands and kidneys demonstrate no significant findings.  The visualized bowel gas pattern is normal.  There are small bilateral pleural effusions with associated worsening bibasilar atelectasis.  IMPRESSION:  1.  The study does demonstrate a single small calculus in the distal common bile duct.  There is no significant biliary dilatation. 2.  Cholelithiasis without evidence of cholecystitis. 3.  Nonspecific retroperitoneal edema surrounding the pancreatic head could represent early/mild pancreatitis.  No focal fluid collection is identified. 4.  Worsening pleural effusions and bibasilar atelectasis. 5.  Situs inversus.  Original Report Authenticated By: Gerrianne Scale, M.D.   Mr 3d Recon At Scanner  06/02/2011  *RADIOLOGY REPORT*  Clinical Data:  Abdominal pain with nausea, vomiting and elevated  liver function tests.  Evaluate for common bile duct stone.  MRI ABDOMEN WITHOUT CONTRAST (MRCP)  Technique:  Multiplanar multisequence MR imaging of the abdomen was performed.  No intravenous contrast was administered.  Comparison:  Abdominal pelvic CT and abdominal ultrasound 06/01/2011.  MRI ABDOMEN  Findings:  Study is moderately motion degraded.  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  There are multiple small gallstones within the gallbladder lumen.  No significant gallbladder wall thickening is identified.  There is no intrahepatic biliary dilatation.  The common bile duct measures 7 mm in diameter.  Within the  distal common bile duct, there is an approximately 5 mm stone which is best seen on the coronal images. No other filling defects are identified.  The pancreatic duct is normal in caliber.  There is no evidence of pancreatic mass or focal surrounding fluid collection. There is some soft tissue edema surrounding the pancreatic head and proximal duodenum.  There is also mild symmetric perinephric soft tissue stranding bilaterally.  The liver, spleen, adrenal glands and kidneys demonstrate no significant findings.  The visualized bowel gas pattern is normal.  There are small bilateral pleural effusions with associated worsening bibasilar atelectasis.  IMPRESSION:  1.  The study does demonstrate a single small calculus in the distal common bile duct.  There is no significant biliary dilatation. 2.  Cholelithiasis without evidence of cholecystitis. 3.  Nonspecific retroperitoneal edema surrounding the pancreatic head could represent early/mild pancreatitis.  No focal fluid collection is identified. 4.  Worsening pleural effusions and bibasilar atelectasis. 5.  Situs inversus.  Original Report Authenticated By: Gerrianne Scale, M.D.   Dg Chest Port 1 View  06/04/2011  *RADIOLOGY REPORT*  Clinical Data: 70 year old female - respiratory failure.  Evaluate endotracheal tube placement.  PORTABLE CHEST - 1 VIEW  Comparison: 06/03/2011 and prior chest radiographs dating back to 06/01/2011  Findings: An endotracheal tube is again identified with tip 4 cm above the carina. Pulmonary vascular congestion and pulmonary edema have slightly increased.  Dextrocardia and right aortic arch are again noted. A right IJ central venous catheter is unchanged with tip overlying the mid SVC on the left. Left lower lung consolidation/atelectasis is unchanged. There is no evidence of pneumothorax.  IMPRESSION: Slightly increased pulmonary vascular congestion/edema without other significant change.  Continued left lower lung  atelectasis/consolidation.  Original Report Authenticated By: Rosendo Gros, M.D.   Dg Chest Port 1 View  06/03/2011  *RADIOLOGY REPORT*  Clinical Data: Status post ERCP.  Interval intubation.  PORTABLE CHEST - 1 VIEW  Comparison: Portal chest 06/03/2011.  Findings: An endotracheal tube has in place.  The tip is 4.7 cm above the carina, in satisfactory position.  The right IJ line is stable position.  Dextrocardia is again noted.  Mild interstitial edema is stable.  A right pleural effusion is unchanged.  IMPRESSION:  1.  Interval intubation without radiographic evidence for complication. 2.  Stable appearance of interstitial edema and right pleural effusion.  Original Report Authenticated By: Jamesetta Orleans. MATTERN, M.D.   Dg Chest Port 1 View  06/03/2011  *RADIOLOGY REPORT*  Clinical Data: Cough.  Vomiting.  PORTABLE CHEST - 1 VIEW  Comparison: 12/29 and 06/01/2011  Findings: Since prior study the patient has developed diffuse accentuation of the interstitial markings consistent with interstitial pulmonary edema.  Again noted is dextrocardia.  Central venous catheter is unchanged. Increased atelectasis at the bases particularly on the right.  New small right effusion.  IMPRESSION: New pulmonary  edema and right effusion.  Increased atelectasis at the bases.  Original Report Authenticated By: Gwynn Burly, M.D.   Dg Ercp With Sphincterotomy  06/03/2011  *RADIOLOGY REPORT*  Clinical Data: Common bile duct stones.  Situs inversus.  ERCP  Comparison:  MRCP dated 06/02/2011  Technique:  Multiple spot images obtained with the fluoroscopic device and submitted for interpretation post-procedure.  ERCP was performed by Dr. Ewing Schlein.  Findings: The ERCP does demonstrate a stone in the common bile duct.  Sphincterotomy and balloon extraction of the stone were performed.  IMPRESSION: Common bile duct stone removed after sphincterotomy.  These images were submitted for radiologic interpretation only. Please see the  procedural report for the amount of contrast and the fluoroscopy time utilized.  Original Report Authenticated By: Gwynn Burly, M.D.    Assessment:  1.  Presumed cholangitis.  Improving post-ERCP.  Liver tests improving. 2.  Choledocholithiasis.  ERCP with sphincterotomy and stone extraction yesterday. 3.  Ventilator-dependent. 4.  Oliguric renal failure.  Plan:  1.  Continue supportive care with IVF, antibiotics. 2.  Ventilator, pressor management etc. Per PCCM. 3.  Likely eventual cholecystectomy, once above matters resolved/significantly improved.   Freddy Jaksch 06/04/2011, 8:22 AM

## 2011-06-04 NOTE — Progress Notes (Signed)
UR Completed.  06/04/2011 [phone] 06/04/2011

## 2011-06-04 NOTE — Progress Notes (Signed)
MEDICATION RELATED CONSULT NOTE - INITIAL   Pharmacy Consult for Septic Shock, Antibiotic Monitoring Imipenem Indication: Septic shock from cholangitis vs gallstone pancreatitis  Allergies  Allergen Reactions  . Penicillins Anaphylaxis   Assessment: 70 yo F admitted 12/29 with abdominal pain and nausea and vomiting.  found to have elevated liver enzymes, elevated WBC, lactate 5, RUQ US showed dilated bile duct and cholelithiasis, confirmed on CT abdomen with also mild infiltration of the apncreas with possible early pancreatitis. Pt became hypotensive and started on cipro, flagyl and broadened to imipenem.  Pharmacist System-Based Medication Review:  Infectious Disease: septic shock cholangitis v. Gallstone pancreatitis D#3  imipenem currently 500 mg q8h, afebrile, decr WBC 20.3, blood-ngtd, urine-neg Cardiovascular: HTN, septic shock, ASA, lisinopril/HCTZ, simvastatin PTA on hold, VP off, weaning levophed (goal MAP 60), on solucortef (cortisol 30) Endocrinology: Off insulin drip- converting to lantus & SSI,  allopruinol 200 mg daily PTA Gastrointestinal: s/p ERCP 12/30, LFTs improving Fluid, Electrolytes, & Nutrition: aggressive hydration- currently 157ml/hr of D5W, CVP 15--will decrease rate to 72ml/hr  Neurology: Neuropathy Gabapentin 300 mg BID Nephrology: Acute Renal Failure, SCr up 1.78, est CrCl 34 ml/min  given weight, follow course with hydration, UOP~0.2cc/kg/hr  Pulmonology: Weaning on vent; may extubated today PTA Medication Issues: all currently on hold Best Practices: SCDs and SQ heparin  Goal of Therapy:  Renal adjustment of antibiotics, evaluation of medication regimen  Plan:  1. Given weight and current Scr will change imipenem to 250mg  IV q6h, follow up SCr, UOP, cultures, clinical course and adjust as clinically indicated.  Patient Measurements: Weight: 231 lb 7.7 oz (105 kg) Vital Signs: Temp: 99.1 F (37.3 C) (12/31 0930) BP: 91/47 mmHg (12/31 0930) Pulse  Rate: 90  (12/31 0930) Intake/Output from previous day: 12/30 0701 - 12/31 0700 In: 4752.5 [I.V.:3942.5; IV Piggyback:810] Out: 570 [Urine:570] Intake/Output from this shift: Total I/O In: 329.1 [I.V.:329.1] Out: 70 [Urine:55; Emesis/NG output:15]  Labs:  [facility] 06/04/11 0430 06/03/11 0400 06/02/11 2247 06/02/11 0400 06/01/11 1555  WBC 20.3* 25.7* -- 26.8* --  HGB 11.1* 11.4* -- 8.9* --  HCT 33.9* 35.6* -- 27.6* --  PLT 223 258 -- 242 --  APTT -- -- -- 27 28  CREATININE 1.78* 1.30* 1.32* -- --  LABCREA -- -- -- -- --  CREATININE 1.78* 1.30* 1.32* -- --  [id number] -- -- -- -- --  MG -- -- -- 1.2* --  PHOS -- -- -- 2.0* --  ALBUMIN 1.9* 2.1* -- 2.2* --  PROT 5.6* 5.7* -- 5.3* --  ALBUMIN 1.9* 2.1* -- 2.2* --  AST 32 66* -- 79* --  ALT 71* 103* -- 118* --  ALKPHOS 270* 318* -- 291* --  BILITOT 1.3* 3.2* -- 2.9* --  BILIDIR -- -- -- -- --  IBILI -- -- -- -- --   CrCl is unknown because there is no height on file for the current visit.   Microbiology: Recent Results (from the past 720 hour(s))  CULTURE, BLOOD (ROUTINE X 2)     Status: Normal (Preliminary result)   Collection Time   06/02/11  5:00 AM      Component Value Range Status Comment   Specimen Description BLOOD RIGHT ARM   Final    Special Requests BOTTLES DRAWN AEROBIC AND ANAEROBIC 10CC   Final    Setup Time [id number]   Final    Culture     Final    Value:        BLOOD CULTURE RECEIVED NO  GROWTH TO DATE CULTURE WILL BE HELD FOR 5 DAYS BEFORE ISSUING A FINAL NEGATIVE REPORT   Report Status PENDING   Incomplete   URINE CULTURE     Status: Normal   Collection Time   06/02/11  6:14 AM      Component Value Range Status Comment   Specimen Description URINE, CATHETERIZED   Final    Special Requests NONE   Final    Setup Time [id number]   Final    Colony Count NO GROWTH   Final    Culture NO GROWTH   Final    Report Status 06/03/2011 FINAL   Final   MRSA PCR SCREENING     Status: Normal   Collection  Time   06/02/11  8:40 AM      Component Value Range Status Comment   MRSA by PCR NEGATIVE  NEGATIVE  Final   CULTURE, SPUTUM-ASSESSMENT     Status: Normal   Collection Time   06/02/11 11:05 PM      Component Value Range Status Comment   Specimen Description SPUTUM   Final    Special Requests NONE   Final    Sputum evaluation     Final    Value: MICROSCOPIC FINDINGS SUGGEST THAT THIS SPECIMEN IS NOT REPRESENTATIVE OF LOWER RESPIRATORY SECRETIONS. PLEASE RECOLLECT.     CALLED TO Lianne Bushy RN AT [phone] ON [id number]   Report Status 06/02/2011 FINAL   Final     Medical History: Past Medical History  Diagnosis Date  . Arthritis   . Neuropathy   . Reflux   . Hypertension   . Elevated cholesterol   . Situs inversus      Medications:  Prescriptions prior to admission  Medication Sig Dispense Refill  . allopurinol (ZYLOPRIM) 100 MG tablet Take 200 mg by mouth daily.        Marland Kitchen aspirin 325 MG tablet Take 325 mg by mouth daily.        Marland Kitchen gabapentin (NEURONTIN) 300 MG capsule Take 300 mg by mouth 2 (two) times daily.        Marland Kitchen HYDROcodone-acetaminophen (VICODIN) 5-500 MG per tablet Take 1 tablet by mouth every 4 (four) hours as needed. For pain.       Marland Kitchen lisinopril-hydrochlorothiazide (PRINZIDE,ZESTORETIC) 20-25 MG per tablet Take 1 tablet by mouth every morning.        Marland Kitchen omeprazole (PRILOSEC) 20 MG capsule Take 20 mg by mouth every morning.        . predniSONE (DELTASONE) 5 MG tablet Take 5 mg by mouth daily.        . simvastatin (ZOCOR) 40 MG tablet Take 40 mg by mouth at bedtime.            Fayne Norrie 06/04/2011,10:12 AM

## 2011-06-05 ENCOUNTER — Inpatient Hospital Stay (HOSPITAL_COMMUNITY): Payer: Medicare Other

## 2011-06-05 DIAGNOSIS — A419 Sepsis, unspecified organism: Secondary | ICD-10-CM

## 2011-06-05 DIAGNOSIS — I509 Heart failure, unspecified: Secondary | ICD-10-CM

## 2011-06-05 DIAGNOSIS — E872 Acidosis, unspecified: Secondary | ICD-10-CM

## 2011-06-05 DIAGNOSIS — R0902 Hypoxemia: Secondary | ICD-10-CM

## 2011-06-05 DIAGNOSIS — I519 Heart disease, unspecified: Secondary | ICD-10-CM

## 2011-06-05 LAB — CBC
HCT: 32.2 % — ABNORMAL LOW (ref 36.0–46.0)
MCHC: 32.6 g/dL (ref 30.0–36.0)
MCV: 91.5 fL (ref 78.0–100.0)
RDW: 15.8 % — ABNORMAL HIGH (ref 11.5–15.5)
WBC: 14.8 10*3/uL — ABNORMAL HIGH (ref 4.0–10.5)

## 2011-06-05 LAB — GLUCOSE, CAPILLARY: Glucose-Capillary: 146 mg/dL — ABNORMAL HIGH (ref 70–99)

## 2011-06-05 LAB — COMPREHENSIVE METABOLIC PANEL
Albumin: 1.9 g/dL — ABNORMAL LOW (ref 3.5–5.2)
BUN: 36 mg/dL — ABNORMAL HIGH (ref 6–23)
Chloride: 113 mEq/L — ABNORMAL HIGH (ref 96–112)
Creatinine, Ser: 1.53 mg/dL — ABNORMAL HIGH (ref 0.50–1.10)
GFR calc Af Amer: 39 mL/min — ABNORMAL LOW (ref >90)
Total Bilirubin: 0.9 mg/dL (ref 0.3–1.2)

## 2011-06-05 LAB — DIFFERENTIAL
Basophils Absolute: 0 10*3/uL (ref 0.0–0.1)
Basophils Relative: 0 % (ref 0–1)
Monocytes Relative: 3 % (ref 3–12)
Neutro Abs: 12.9 10*3/uL — ABNORMAL HIGH (ref 1.7–7.7)
Neutrophils Relative %: 87 % — ABNORMAL HIGH (ref 43–77)

## 2011-06-05 MED ORDER — SODIUM CHLORIDE 0.9 % IJ SOLN
INTRAMUSCULAR | Status: AC
  Administered 2011-06-05: 10:00:00 via INTRAVENOUS
  Filled 2011-06-05: qty 10

## 2011-06-05 MED ORDER — CIPROFLOXACIN IN D5W 400 MG/200ML IV SOLN
400.0000 mg | Freq: Two times a day (BID) | INTRAVENOUS | Status: DC
  Administered 2011-06-05 – 2011-06-14 (×18): 400 mg via INTRAVENOUS
  Filled 2011-06-05 (×22): qty 200

## 2011-06-05 MED ORDER — HYDROCORTISONE SOD SUCCINATE 100 MG IJ SOLR
25.0000 mg | Freq: Two times a day (BID) | INTRAMUSCULAR | Status: DC
  Administered 2011-06-05 – 2011-06-06 (×2): 25 mg via INTRAVENOUS
  Filled 2011-06-05 (×4): qty 0.5

## 2011-06-05 MED ORDER — METRONIDAZOLE IN NACL 5-0.79 MG/ML-% IV SOLN
500.0000 mg | Freq: Four times a day (QID) | INTRAVENOUS | Status: DC
  Administered 2011-06-05 – 2011-06-18 (×49): 500 mg via INTRAVENOUS
  Filled 2011-06-05 (×60): qty 100

## 2011-06-05 NOTE — Progress Notes (Signed)
  Echocardiogram 2D Echocardiogram has been performed.  Juanita Laster Brandalynn Ofallon, RDCS 06/05/2011, 1:52 PM

## 2011-06-05 NOTE — Progress Notes (Signed)
ANTIBIOTIC CONSULT NOTE - FOLLOW UP  Pharmacy Consult for cipro Indication: cholangitis vs gallstone pancreatitis  Allergies  Allergen Reactions  . Penicillins Anaphylaxis    Patient Measurements: Height: 5\' 6"  (167.6 cm) Weight: 252 lb 3.3 oz (114.4 kg) IBW/kg (Calculated) : 59.3    Vital Signs: Temp: 99.1 F (37.3 C) (01/01 1400) BP: 134/77 mmHg (01/01 1400) Pulse Rate: 101  (01/01 1400) Intake/Output from previous day: 12/31 0701 - 01/01 0700 In: 1582.6 [I.V.:1252.6; IV Piggyback:330] Out: 1470 [Urine:770; Emesis/NG output:700] Intake/Output from this shift: Total I/O In: 680 [I.V.:240; IV Piggyback:440] Out: 225 [Urine:225]  Labs:  [facility] 06/05/11 0500 06/04/11 0430 06/03/11 0400  WBC 14.8* 20.3* 25.7*  HGB 10.5* 11.1* 11.4*  PLT 205 223 258  LABCREA -- -- --  CREATININE 1.53* 1.78* 1.30*   Estimated Creatinine Clearance: 44.5 ml/min (by C-G formula based on Cr of 1.53).    Assessment:  Change imipenem to cipro and flagyl for cholangitis vs gallstone pancreatitis. Renal function improving w/ creat cl 45 ml/min   Plan: cipro 400mg  IV q12, also on flagly 500mg  IV q6h per MD.   Will f/u renal function and culture data.  Len Childs T 06/05/2011,2:20 PM

## 2011-06-05 NOTE — Progress Notes (Signed)
2 Days Post-Op  Subjective: Extubated, no ab pain, feels much better  Objective: Vital signs in last 24 hours: Temp:  [98.1 F (36.7 C)-99.7 F (37.6 C)] 98.4 F (36.9 C) (01/01 0700) Pulse Rate:  [85-108] 88  (01/01 0700) Resp:  [11-29] 17  (01/01 0700) BP: (86-119)/(47-88) 102/67 mmHg (01/01 0700) SpO2:  [90 %-99 %] 97 % (01/01 0833) FiO2 (%):  [40 %] 40 % (12/31 1045) Weight:  [252 lb 3.3 oz (114.4 kg)] 252 lb 3.3 oz (114.4 kg) (01/01 0500)    Intake/Output from previous day: 12/31 0701 - 01/01 0700 In: 1562.6 [I.V.:1252.6; IV Piggyback:310] Out: 1470 [Urine:770; Emesis/NG output:700] Intake/Output this shift: Total I/O In: 100 [IV Piggyback:100] Out: -   GI: soft, non-tender; bowel sounds normal; no masses,  no organomegaly  Lab Results:   Basename 06/05/11 0500 06/04/11 0430  WBC 14.8* 20.3*  HGB 10.5* 11.1*  HCT 32.2* 33.9*  PLT 205 223   BMET  Basename 06/05/11 0500 06/04/11 0430  NA 142 140  K 4.1 4.6  CL 113* 114*  CO2 19 18*  GLUCOSE 155* 143*  BUN 36* 32*  CREATININE 1.53* 1.78*  CALCIUM 8.7 8.9   PT/INR No results found for this basename: LABPROT:2,INR:2 in the last 72 hours ABG  Basename 06/04/11 0508 06/03/11 1340  PHART 7.385 7.267*  HCO3 16.3* 16.4*    Studies/Results: Dg Chest Port 1 View  06/04/2011  *RADIOLOGY REPORT*  Clinical Data: 71 year old female - respiratory failure.  Evaluate endotracheal tube placement.  PORTABLE CHEST - 1 VIEW  Comparison: 06/03/2011 and prior chest radiographs dating back to 06/01/2011  Findings: An endotracheal tube is again identified with tip 4 cm above the carina. Pulmonary vascular congestion and pulmonary edema have slightly increased.  Dextrocardia and right aortic arch are again noted. A right IJ central venous catheter is unchanged with tip overlying the mid SVC on the left. Left lower lung consolidation/atelectasis is unchanged. There is no evidence of pneumothorax.  IMPRESSION: Slightly increased  pulmonary vascular congestion/edema without other significant change.  Continued left lower lung atelectasis/consolidation.  Original Report Authenticated By: Rosendo Gros, M.D.   Dg Chest Port 1 View  06/03/2011  *RADIOLOGY REPORT*  Clinical Data: Status post ERCP.  Interval intubation.  PORTABLE CHEST - 1 VIEW  Comparison: Portal chest 06/03/2011.  Findings: An endotracheal tube has in place.  The tip is 4.7 cm above the carina, in satisfactory position.  The right IJ line is stable position.  Dextrocardia is again noted.  Mild interstitial edema is stable.  A right pleural effusion is unchanged.  IMPRESSION:  1.  Interval intubation without radiographic evidence for complication. 2.  Stable appearance of interstitial edema and right pleural effusion.  Original Report Authenticated By: Jamesetta Orleans. MATTERN, M.D.   Dg Ercp With Sphincterotomy  06/03/2011  *RADIOLOGY REPORT*  Clinical Data: Common bile duct stones.  Situs inversus.  ERCP  Comparison:  MRCP dated 06/02/2011  Technique:  Multiple spot images obtained with the fluoroscopic device and submitted for interpretation post-procedure.  ERCP was performed by Dr. Ewing Schlein.  Findings: The ERCP does demonstrate a stone in the common bile duct.  Sphincterotomy and balloon extraction of the stone were performed.  IMPRESSION: Common bile duct stone removed after sphincterotomy.  These images were submitted for radiologic interpretation only. Please see the procedural report for the amount of contrast and the fluoroscopy time utilized.  Original Report Authenticated By: Gwynn Burly, M.D.    Anti-infectives: Anti-infectives  Start     Dose/Rate Route Frequency Ordered Stop   06/04/11 1200   imipenem-cilastatin (PRIMAXIN) 250 mg in sodium chloride 0.9 % 100 mL IVPB        250 mg 200 mL/hr over 30 Minutes Intravenous 4 times per day 06/04/11 1031     06/02/11 2200   imipenem-cilastatin (PRIMAXIN) 500 mg in sodium chloride 0.9 % 100 mL IVPB   Status:  Discontinued        500 mg 200 mL/hr over 30 Minutes Intravenous 3 times per day 06/02/11 1226 06/04/11 1031   06/02/11 0600   metroNIDAZOLE (FLAGYL) IVPB 500 mg  Status:  Discontinued        500 mg 100 mL/hr over 60 Minutes Intravenous Every 8 hours 06/02/11 0504 06/02/11 0511   06/02/11 0600   imipenem-cilastatin (PRIMAXIN) 500 mg in sodium chloride 0.9 % 100 mL IVPB  Status:  Discontinued        500 mg 200 mL/hr over 30 Minutes Intravenous 4 times per day 06/02/11 0513 06/02/11 1226   06/02/11 0445   metroNIDAZOLE (FLAGYL) IVPB 500 mg  Status:  Discontinued        500 mg 100 mL/hr over 60 Minutes Intravenous  Once 06/02/11 0431 06/02/11 0432   06/02/11 0430   ertapenem (INVANZ) 1 g in sodium chloride 0.9 % 50 mL IVPB  Status:  Discontinued        1 g 100 mL/hr over 30 Minutes Intravenous Daily 06/02/11 0400 06/02/11 0512   06/01/11 2230   ciprofloxacin (CIPRO) IVPB 400 mg        400 mg 200 mL/hr over 60 Minutes Intravenous  Once 06/01/11 2215 06/02/11 0015   06/01/11 2230   metroNIDAZOLE (FLAGYL) IVPB 500 mg  Status:  Discontinued        500 mg 100 mL/hr over 60 Minutes Intravenous Every 8 hours 06/01/11 2215 06/02/11 0402          Assessment/Plan: s/p Procedure(s): ENDOSCOPIC RETROGRADE CHOLANGIOPANCREATOGRAPHY (ERCP) for cholangitis  She is due to come out of ICU today as she is much better. Will check labs again in am, wbc much improved, cont abx Dc og tube If she clinically improves and is well I think cholecystectomy this admission would be reasonable plan but we discussed this would be daily decision pending improvement.    LOS: 4 days    [facility] 06/05/2011

## 2011-06-05 NOTE — Progress Notes (Signed)
Subjective: Extubated yesterday. Abdominal pain improving.  Objective: Vital signs in last 24 hours: Temp:  [98.1 F (36.7 C)-99.7 F (37.6 C)] 98.4 F (36.9 C) (01/01 0700) Pulse Rate:  [85-108] 88  (01/01 0700) Resp:  [11-29] 17  (01/01 0700) BP: (86-119)/(47-88) 102/67 mmHg (01/01 0700) SpO2:  [90 %-99 %] 97 % (01/01 0833) FiO2 (%):  [40 %] 40 % (12/31 1045) Weight:  [114.4 kg (252 lb 3.3 oz)] 252 lb 3.3 oz (114.4 kg) (01/01 0500) Weight change:     PE: GEN:  Alert, extubated, conversant ABD:  Soft    Labs:  LFTs improving; WBC downtrend   Assessment:  1.  Cholangitis, post ERCP + sphincterotomy + CBD stone extraction.  Improving. 2.  Cholelithiasis.  Plan:  1.  Antibiotics and supportive care. 2.  CCS management re: timing of cholecystectomy. 3.  Will sign off; please call with questions.   [name] 06/05/2011, 9:11 AM

## 2011-06-05 NOTE — Progress Notes (Signed)
[name] is an 71 y.o. female.   Chief Complaint: Abdominal pain, hypotension  HPI: Pt is a 71 y/o F withnPMHx of HTN, DL who came to the ER on 09/81 with Hx of 1 day of epigastric abdominal radiated to the back associated with nausea and vomiting. Pt also reported feeling warm, but denied any chills, cough, sputum production or syncopal episodes. Pt was found to have elevated liver enzymes, elevated WBC, lactate 5, RUQ US showed dilated bile duct and cholelithiasis, confirmed on CT abdomen with also mild infiltration of the apncreas with possible early pancreatitis. Pt became hypotensive in the ER in the 80's/50', tachycardic and was given 4L of NS with no improvement. Pt was started on Dopamine through peripheral line and CCM was called.   Lines/tubes Right IJ 12/29>>> Oral ETT 12/30>>>12/31  Culture data mrsa screen 12/29: negative UC 12/29:>>> BCX1 12/29>>>  Antibiotics primaxin 12/29 (sepsis/PCN allergy)>>>1/1 cipro 1/1>>> Flagyl 1/1>>>  Consults GI and gen surg  Events/studies 06/01/2011  .  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 Cholelithiasis without obvious bile duct dilatation.  Suggestion of mild infiltration around the head of the pancreas which could represent early changes of pancreatitis.  Laboratory correlation recommended.  Infiltration or atelectasis in both lung bases.  Original Report Authenticated By: Marlon Pel, M.D.  06/01/2011   COMPLETE ABDOMINAL ULTRASOUND  IMPRESSION: Technically challenging examination as described above.  Cholelithiasis without definitive sonographic evidence for cholecystitis.  The CBD is mildly distended up to 8 mm.  No obstructing lesion identified however the distal duct is obscured by overlying bowel gas.Correlate with LFTs and ERCP or MRCP if clinically warranted.  Original Report Authenticated By: Waneta Martins, M.D.  12/29 MRI abd:  1. The study  does demonstrate a single small calculus in the distal common bile duct. There is no significant biliary dilatation.2. Cholelithiasis without evidence of cholecystitis.3. Nonspecific retroperitoneal edema surrounding the pancreatic head could represent early/mild pancreatitis. No focal fluid collection is identified. ERCP 12/30: Findings: The ERCP does demonstrate a stone in the common bile duct. Sphincterotomy and balloon extraction of the stone were  performed.  IMPRESSION:  Common bile duct stone removed after sphincterotomy.  Subjective 12/31- extubated successfully  Objective  Temp:  [98.1 F (36.7 C)-99.7 F (37.6 C)] 98.6 F (37 C) (01/01 1000) Pulse Rate:  [85-108] 101  (01/01 1000) Resp:  [16-29] 23  (01/01 1000) BP: (86-119)/(48-88) 107/60 mmHg (01/01 1000) SpO2:  [90 %-99 %] 99 % (01/01 1000) Weight:  [114.4 kg (252 lb 3.3 oz)] 252 lb 3.3 oz (114.4 kg) (01/01 0500) CVP:  [11 mmHg] 11 mmHg  . sodium chloride 100 mL/hr at 06/03/11 1234  . sodium chloride    . Levophed gtt 4mcg/min      Intake/Output Summary (Last 24 hours) at 06/05/11 1128 Last data filed at 06/05/11 1000  Gross per 24 hour  Intake 1406.4 ml  Output   1455 ml  Net  -48.6 ml    Physical Exam  Pt is alert HEENT: no jvd, no distress CV: S1 and S2 normal, HR sounds on rt Lungs: scattered rhonchi mild  Abdomen: Tender on palpation in the LUQ and epigastrium. Decreased bowel sounds, no peritoneal signs. Extrem: no low extrem edema, no calf tenderness. Neuro: no focal deficit.    Labs  Lab 06/05/11 0500 06/04/11 0430 06/03/11 0400  NA 142 140 139  K 4.1 4.6 4.4  CL 113* 114*  110  CO2 19 18* 19  BUN 36* 32* 21  CREATININE 1.53* 1.78* 1.30*  GLUCOSE 155* 143* 182*    Lab 06/05/11 0500 06/04/11 0430 06/03/11 0400  HGB 10.5* 11.1* 11.4*  HCT 32.2* 33.9* 35.6*  WBC 14.8* 20.3* 25.7*  PLT 205 223 258   PCXR:ETT and CVL in good position. Right effusion, increased bilateral airspace disease.    Assessment/Plan  VDRF: post ERCP, patient was in a prone position and anesthesia elected to keep intubate.  Extubated doing well Lower O2 needs May need lasix  SIRS/Sepsis/Septic shock in setting of Cholangitis and gallstone pancreatitis requiring high dose levophed s/p ERCP.  Resolved Needs line still for access , difficult access Taper steroids to q12h, is on hom epred, will need to get to pred when taking oral  Lab 06/05/11 0500 06/04/11 0430 06/03/11 0400  WBC 14.8* 20.3* 25.7*    Lab 06/04/11 0430 06/02/11 0400 06/01/11 2305  PROCALCITON 12.91 11.79 5.06    Hyperchloremia Change to 1/2 NS, kvo Resolving well  Cholecysttitis, Cholangitis Enzymes and bili trending down Improved clinicaly, pressors etc Npo ppi Per surgery when can we start clears? ngt out   Adrenal insufficiency.  Plan: Stress roids to 25 q12h then to home pred when able  ARF: in setting of shock: ATN likley Recent Labs  Basename 06/05/11 0500 06/04/11 0430 06/03/11 0400   CREATININE 1.53* 1.78* 1.30*  plan: -kvo Resolving No NS with cl113  Anemia: got one unit PRBC on 12/29  Lab 06/05/11 0500 06/04/11 0430 06/03/11 0400  HGB 10.5* 11.1* 11.4*  plan: -transfuse for hgb goal of > 7 sub q hep  Hyperglycemia CBG (last 3)   Basename 06/05/11 0718 06/05/11 0407 06/04/11 2356  GLUCAP 144* 148* 146*  Plan: ssi protocol add lantus given to transition off   Trop noted in setting renal failure No arrythmia Echo Dc tele  Fractured right elbow Plan -has f/u with ortho on 1/3 -keep in immobilized position for comfort ot pt called  Best practice DVT prophylaxis:  SCDs   To floor  Mcarthur Rossetti. Tyson Alias, MD, FACP Pgr: [phone] [facility]   Nelda Bucks., M.D. [phone]

## 2011-06-06 ENCOUNTER — Inpatient Hospital Stay (HOSPITAL_COMMUNITY): Payer: Medicare Other

## 2011-06-06 ENCOUNTER — Encounter (HOSPITAL_COMMUNITY): Payer: Self-pay | Admitting: Gastroenterology

## 2011-06-06 DIAGNOSIS — R7989 Other specified abnormal findings of blood chemistry: Secondary | ICD-10-CM

## 2011-06-06 LAB — COMPREHENSIVE METABOLIC PANEL
AST: 16 U/L (ref 0–37)
Albumin: 2.1 g/dL — ABNORMAL LOW (ref 3.5–5.2)
BUN: 37 mg/dL — ABNORMAL HIGH (ref 6–23)
Calcium: 8.7 mg/dL (ref 8.4–10.5)
Chloride: 113 mEq/L — ABNORMAL HIGH (ref 96–112)
Creatinine, Ser: 1.34 mg/dL — ABNORMAL HIGH (ref 0.50–1.10)
Total Bilirubin: 0.8 mg/dL (ref 0.3–1.2)
Total Protein: 5.5 g/dL — ABNORMAL LOW (ref 6.0–8.3)

## 2011-06-06 LAB — CBC
MCHC: 32.2 g/dL (ref 30.0–36.0)
MCV: 91.8 fL (ref 78.0–100.0)
Platelets: 235 10*3/uL (ref 150–400)
RDW: 15.4 % (ref 11.5–15.5)
WBC: 11.4 10*3/uL — ABNORMAL HIGH (ref 4.0–10.5)

## 2011-06-06 LAB — GLUCOSE, CAPILLARY
Glucose-Capillary: 102 mg/dL — ABNORMAL HIGH (ref 70–99)
Glucose-Capillary: 105 mg/dL — ABNORMAL HIGH (ref 70–99)
Glucose-Capillary: 126 mg/dL — ABNORMAL HIGH (ref 70–99)

## 2011-06-06 LAB — DIFFERENTIAL
Lymphocytes Relative: 12 % (ref 12–46)
Lymphs Abs: 1.4 10*3/uL (ref 0.7–4.0)
Monocytes Absolute: 0.7 10*3/uL (ref 0.1–1.0)
Monocytes Relative: 7 % (ref 3–12)
Neutro Abs: 9.3 10*3/uL — ABNORMAL HIGH (ref 1.7–7.7)
Neutrophils Relative %: 82 % — ABNORMAL HIGH (ref 43–77)

## 2011-06-06 MED ORDER — FUROSEMIDE 10 MG/ML IJ SOLN
40.0000 mg | Freq: Two times a day (BID) | INTRAMUSCULAR | Status: DC
  Administered 2011-06-06: 40 mg via INTRAVENOUS
  Filled 2011-06-06: qty 4

## 2011-06-06 MED ORDER — HYDROCORTISONE SOD SUCCINATE 100 MG IJ SOLR
25.0000 mg | Freq: Every day | INTRAMUSCULAR | Status: DC
  Administered 2011-06-07 – 2011-06-08 (×2): 25 mg via INTRAVENOUS
  Filled 2011-06-06 (×3): qty 0.5

## 2011-06-06 MED ORDER — FUROSEMIDE 10 MG/ML IJ SOLN
80.0000 mg | Freq: Three times a day (TID) | INTRAMUSCULAR | Status: DC
  Administered 2011-06-06 – 2011-06-08 (×5): 80 mg via INTRAVENOUS
  Filled 2011-06-06 (×8): qty 8

## 2011-06-06 MED ORDER — CAPTOPRIL 6.25 MG HALF TABLET
6.2500 mg | ORAL_TABLET | Freq: Two times a day (BID) | ORAL | Status: DC
  Administered 2011-06-06 – 2011-06-11 (×9): 6.25 mg via ORAL
  Filled 2011-06-06 (×17): qty 1

## 2011-06-06 MED ORDER — INSULIN GLARGINE 100 UNIT/ML ~~LOC~~ SOLN
5.0000 [IU] | Freq: Every day | SUBCUTANEOUS | Status: DC
  Administered 2011-06-06 – 2011-06-12 (×7): 5 [IU] via SUBCUTANEOUS
  Filled 2011-06-06: qty 3

## 2011-06-06 NOTE — Consult Note (Signed)
CARDIOLOGY CONSULT NOTE  Patient ID: [name], MRN: [id number], DOB/AGE: 09-14-1940 71 y.o. Admit date: 06/01/2011 Date of Consult: 06/06/2011  Primary Physician: No primary provider on file. Primary Cardiologist:    Chief Complaint:    HPI: We are asked to see pt because of abnormal troponins seen following ERCP and an echo showing EF of 45%  Pt has no cardiac history and presented wi symptoms of abd pain fever and dilated ducts panreatitis and elevated LFTs complicated by hypotension and presumed SIRS  Underwsnet ERCP wth improvement in WBCs  She hx of palpittions abrupt onset and offset.  No exertional cp but gradually worsening shortness of breath and chronic edema  Remote previous echo and stress test  Past Medical History  Diagnosis Date  . Arthritis   . Neuropathy   . Reflux   . Hypertension   . Elevated cholesterol   . Situs inversus       Surgical History:  Past Surgical History  Procedure Date  . Elbow fracture surgery   . Vaginal hysterectomy   . Ercp 06/03/2011    Procedure: ENDOSCOPIC RETROGRADE CHOLANGIOPANCREATOGRAPHY (ERCP);  Surgeon: Petra Kuba, MD;  Location: [facility];  Service: Endoscopy;  Laterality: N/A;     Home Meds: Prior to Admission medications   Medication Sig Start Date End Date Taking? Authorizing Provider  allopurinol (ZYLOPRIM) 100 MG tablet Take 200 mg by mouth daily.     Yes Historical Provider, MD  aspirin 325 MG tablet Take 325 mg by mouth daily.     Yes Historical Provider, MD  gabapentin (NEURONTIN) 300 MG capsule Take 300 mg by mouth 2 (two) times daily.     Yes Historical Provider, MD  HYDROcodone-acetaminophen (VICODIN) 5-500 MG per tablet Take 1 tablet by mouth every 4 (four) hours as needed. For pain.    Yes Historical Provider, MD  lisinopril-hydrochlorothiazide (PRINZIDE,ZESTORETIC) 20-25 MG per tablet Take 1 tablet by mouth every morning.     Yes Historical Provider, MD  omeprazole (PRILOSEC) 20 MG capsule Take 20 mg by  mouth every morning.     Yes Historical Provider, MD  predniSONE (DELTASONE) 5 MG tablet Take 5 mg by mouth daily.     Yes Historical Provider, MD  simvastatin (ZOCOR) 40 MG tablet Take 40 mg by mouth at bedtime.     Yes Historical Provider, MD    Inpatient Medications:    . albuterol  2.5 mg Nebulization Q6H  . aspirin  300 mg Rectal Once  . ciprofloxacin  400 mg Intravenous Q12H  . furosemide  40 mg Intravenous Q12H  . heparin subcutaneous  5,000 Units Subcutaneous Q8H  . hydrocortisone sod succinate (SOLU-CORTEF) injection  25 mg Intravenous Daily  . insulin aspart  0-15 Units Subcutaneous Q4H  . insulin glargine  5 Units Subcutaneous QHS  . ipratropium  0.5 mg Nebulization Q6H  . metronidazole  500 mg Intravenous Q6H  . ondansetron (ZOFRAN) IV  4 mg Intravenous Once  . ondansetron (ZOFRAN) IV  4 mg Intravenous Once  . pantoprazole (PROTONIX) IV  40 mg Intravenous Q24H  . sodium chloride  1,000 mL Intravenous Once  . sodium chloride  1,000 mL Intravenous Once  . sodium chloride  25 mL/kg Intravenous Once  . sodium chloride  500 mL Intravenous Once  . DISCONTD: hydrocortisone sod succinate (SOLU-CORTEF) injection  25 mg Intravenous Q12H  . DISCONTD: insulin glargine  15 Units Subcutaneous QHS    Allergies:  Allergies  Allergen Reactions  .  Penicillins Anaphylaxis    History   Social History  . Marital Status: Married    Spouse Name: N/A    Number of Children: N/A  . Years of Education: N/A   Occupational History  . Not on file.   Social History Main Topics  . Smoking status: Never Smoker   . Smokeless tobacco: Not on file  . Alcohol Use: No  . Drug Use: No  . Sexually Active:    Other Topics Concern  . Not on file   Social History Narrative  . No narrative on file     History reviewed. No pertinent family history.   Review of Systems:  General: negative for chills, fever, night sweats or weight changes.  Cardiovascular: negative for chest pain,    Orthopnea, Dermatological: negative for rash Respiratory: negative for cough or wheezing Urologic: negative for hematuria Abdominal: negative for nausea, vomiting, diarrhea, bright red blood per rectum, melena, or hematemesis some GERD Neurologic: negative for visual changes, syncope, or dizziness All other systems reviewed and are otherwise negative except as noted above.          Physical Exam:  Blood pressure 120/78, pulse 122, temperature 99 F (37.2 C), temperature source Core (Comment), resp. rate 24, height 5\' 6"  (1.676 m), weight 249 lb 12.5 oz (113.3 kg), SpO2 92.00%. General: Well developed, well nourished, in mild pain and resp distress. Head: Normocephalic, atraumatic, sclera non-icteric, no xanthomas, nares are without discharge. Lymph Nodes:  none Neck: Negative for carotid bruits. JVD not discdernble with cath IJ in place Lungs: Clear bilaterally to auscultation with occ wheeze   Heart: RRR with S1 S2. No murmurs, rubs, or gallops appreciated. Abdomen: Soft, non-tender, non-distended with normoactive bowel sounds. No hepatomegaly. No rebound/guarding. No obvious abdominal masses. Msk:  Strength and tone appear normal for age. Extremities: No clubbing or cyanosis. No edema.  Distal pedal pulses are 2+ and equal bilaterally. Right arm bandaged and splinted Skin: Warm and Dry Neuro: Alert and oriented X 3. CN III-XII intact Grossly normal sensory and motor function . Psych:  Responds to questions appropriately with a normal affect.      Labs: Cardiac Enzymes  Basename 06/03/11 1530  CKTOTAL 290*  CKMB 6.9*  TROPONINI 1.04*   CBC Lab Results  Component Value Date   WBC 11.4* 06/06/2011   HGB 10.8* 06/06/2011   HCT 33.5* 06/06/2011   MCV 91.8 06/06/2011   PLT 235 06/06/2011   PROTIME: No results found for this basename: LABPROT:3,INR:3 in the last 72 hours Chemistry  Lab 06/06/11 0510  NA 143  K 4.1  CL 113*  CO2 21  BUN 37*  CREATININE 1.34*  CALCIUM 8.7    PROT 5.5*  BILITOT 0.8  ALKPHOS 192*  ALT 32  AST 16  GLUCOSE 109*   Lipids No results found for this basename: CHOL, HDL, LDLCALC, TRIG   Miscellaneous Lab Results  Component Value Date   DDIMER 0.75* 06/01/2011    Radiology/Studies:  Ct Abdomen Pelvis Wo Contrast  06/01/2011  *RADIOLOGY REPORT*  Clinical Data: Chest and abdominal pain.  Nausea.  CT ABDOMEN AND PELVIS WITHOUT CONTRAST  Technique:  Multidetector CT imaging of the abdomen and pelvis was performed following the standard protocol without intravenous contrast.  Comparison: None.  Findings: Atelectasis or consolidation in the lung bases. Dextrocardia with right sided aortic arch.  Situs inversus. Cholelithiasis without obvious bile duct dilatation.  Other than the abdominal situs inversus, the unenhanced liver, spleen, stomach, adrenal glands, kidneys,  and retroperitoneal lymph nodes are unremarkable.  Mild calcification of the abdominal aorta without aneurysm.  There is a suggestion of minimal infiltrative change around the head of the pancreas which could represent early focal pancreatitis.  Laboratory correlation is suggested.  No apparent pancreatic ductal dilatation.  No free fluid or free air in the abdomen.  The small bowel are not dilated.  The colon is decompressed without obvious wall thickening.  No free air or free fluid in the abdomen.  Pelvis:  The bladder wall is not thickened.  The uterus appears atrophic.  No abnormal adnexal masses.  No free or loculated pelvic fluid collections.  No inflammatory changes suggested in the sigmoid colon.  The appendix is normal.  Mild degenerative changes in the spine. Soft tissue calcification in the buttocks consistent with injection granulomas.  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 Cholelithiasis without obvious bile duct dilatation.  Suggestion of mild infiltration around the head of the pancreas which could represent early changes of  pancreatitis.  Laboratory correlation recommended.  Infiltration or atelectasis in both lung bases.  Original Report Authenticated By: Marlon Pel, M.D.   Dg Chest 2 View  06/01/2011  *RADIOLOGY REPORT*  Clinical Data: Left-sided chest pain.  Shortness of breath.  CHEST - 2 VIEW  Comparison: Report of chest x-ray 04/13/2002 and 06/30/2000.  Findings: Dextrocardia is present.  Right-sided aortic arch is also noted.  There is eventration of the left hemidiaphragm as previously reported.  Minimal bibasilar airspace disease likely reflects atelectasis.  No other airspace consolidation is evident. The visualized soft tissues and bony thorax are unremarkable.  IMPRESSION:  1.  Dextrocardia and right-sided aortic arch, suggesting situs inversus. 2.  Stable eventration of left hemidiaphragm. 3.  Mild bibasilar airspace disease likely reflects atelectasis.  Original Report Authenticated By: Jamesetta Orleans. MATTERN, M.D.   US Abdomen Complete  06/01/2011  *RADIOLOGY REPORT*  Clinical Data:  Nausea, vomiting.  History of situs inversus.  COMPLETE ABDOMINAL ULTRASOUND  Comparison:  None.  Findings: Technically challenging examination secondary patient body habitus, situs inversus, and limited movement due to a right arm injury.  Gallbladder:  Gallstones are present.  No gallbladder wall thickening or pericholecystic fluid.  Negative sonographic Murphy's sign.  Common bile duct:  Mildly distended up to 8 mm.  No obstructing lesion identified however the distal duct is obscured by overlying bowel gas.  Liver:  Limited acoustic windows.  Suggestion of fatty infiltration.  IVC:  Not visualized.  Pancreas:  No focal abnormality identified within the head/neck. The body and tail are obscured by overlying bowel.  Spleen:  Within normal limits, measuring 8 cm in diameter.  Right Kidney:  Normal sonographic appearance, without hydronephrosis.  Measures 9.6 cm.  Left Kidney:  Normal sonographic appearance, without  hydronephrosis.  Measures 9.7 cm.  Abdominal aorta:  Portions were obscured by overlying bowel gas. Where seen, measures up to 2.8 cm in diameter.  IMPRESSION: Technically challenging examination as described above.  Cholelithiasis without definitive sonographic evidence for cholecystitis.  The CBD is mildly distended up to 8 mm.  No obstructing lesion identified however the distal duct is obscured by overlying bowel gas.Correlate with LFTs and ERCP or MRCP if clinically warranted.  Original Report Authenticated By: Waneta Martins, M.D.   Mr Mrcp  06/02/2011  *RADIOLOGY REPORT*  Clinical Data:  Abdominal pain with nausea, vomiting and elevated liver function tests.  Evaluate for common bile duct stone.  MRI ABDOMEN WITHOUT CONTRAST (MRCP)  Technique:  Multiplanar multisequence MR imaging of the abdomen was performed.  No intravenous contrast was administered.  Comparison:  Abdominal pelvic CT and abdominal ultrasound 06/01/2011.  MRI ABDOMEN  Findings:  Study is moderately motion degraded.  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  There are multiple small gallstones within the gallbladder lumen.  No significant gallbladder wall thickening is identified.  There is no intrahepatic biliary dilatation.  The common bile duct measures 7 mm in diameter.  Within the distal common bile duct, there is an approximately 5 mm stone which is best seen on the coronal images. No other filling defects are identified.  The pancreatic duct is normal in caliber.  There is no evidence of pancreatic mass or focal surrounding fluid collection. There is some soft tissue edema surrounding the pancreatic head and proximal duodenum.  There is also mild symmetric perinephric soft tissue stranding bilaterally.  The liver, spleen, adrenal glands and kidneys demonstrate no significant findings.  The visualized bowel gas pattern is normal.  There are small bilateral pleural effusions with associated worsening  bibasilar atelectasis.  IMPRESSION:  1.  The study does demonstrate a single small calculus in the distal common bile duct.  There is no significant biliary dilatation. 2.  Cholelithiasis without evidence of cholecystitis. 3.  Nonspecific retroperitoneal edema surrounding the pancreatic head could represent early/mild pancreatitis.  No focal fluid collection is identified. 4.  Worsening pleural effusions and bibasilar atelectasis. 5.  Situs inversus.  Original Report Authenticated By: Gerrianne Scale, M.D.   Mr 3d Recon At Scanner  06/02/2011  *RADIOLOGY REPORT*  Clinical Data:  Abdominal pain with nausea, vomiting and elevated liver function tests.  Evaluate for common bile duct stone.  MRI ABDOMEN WITHOUT CONTRAST (MRCP)  Technique:  Multiplanar multisequence MR imaging of the abdomen was performed.  No intravenous contrast was administered.  Comparison:  Abdominal pelvic CT and abdominal ultrasound 06/01/2011.  MRI ABDOMEN  Findings:  Study is moderately motion degraded.  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  There are multiple small gallstones within the gallbladder lumen.  No significant gallbladder wall thickening is identified.  There is no intrahepatic biliary dilatation.  The common bile duct measures 7 mm in diameter.  Within the distal common bile duct, there is an approximately 5 mm stone which is best seen on the coronal images. No other filling defects are identified.  The pancreatic duct is normal in caliber.  There is no evidence of pancreatic mass or focal surrounding fluid collection. There is some soft tissue edema surrounding the pancreatic head and proximal duodenum.  There is also mild symmetric perinephric soft tissue stranding bilaterally.  The liver, spleen, adrenal glands and kidneys demonstrate no significant findings.  The visualized bowel gas pattern is normal.  There are small bilateral pleural effusions with associated worsening bibasilar atelectasis.   IMPRESSION:  1.  The study does demonstrate a single small calculus in the distal common bile duct.  There is no significant biliary dilatation. 2.  Cholelithiasis without evidence of cholecystitis. 3.  Nonspecific retroperitoneal edema surrounding the pancreatic head could represent early/mild pancreatitis.  No focal fluid collection is identified. 4.  Worsening pleural effusions and bibasilar atelectasis. 5.  Situs inversus.  Original Report Authenticated By: Gerrianne Scale, M.D.   Dg Chest Port 1 View  06/06/2011  *RADIOLOGY REPORT*  Clinical Data: Short of breath.  PORTABLE CHEST - 1 VIEW  Comparison: 06/05/2011  Findings: Dextrocardia and right-sided  aortic arch again noted. Despite lighter radiographic technique, there is been mild worsening of bilateral pulmonary air space disease, suspicious for increased pulmonary edema.  Bibasilar atelectasis and probable small bilateral pleural effusions are also noted.  Cardiomegaly stable.  Right jugular center venous catheter remains in appropriate position.  Nasogastric tube is no longer visualized.  IMPRESSION:  1.  Increased in bilateral airspace opacity, likely due to pulmonary edema. 2.  Stable cardiomegaly. 3.  Bibasilar atelectasis and probable small bilateral pleural effusions.  Original Report Authenticated By: Danae Orleans, M.D.   Dg Chest Port 1 View  06/05/2011  *RADIOLOGY REPORT*  Clinical Data: Status post extubation.  PORTABLE CHEST - 1 VIEW  Comparison: Portal chest 06/04/2011.  Findings: The patient has been extubated.  A right IJ line is stable.  An NG tube is now in place.  Dextrocardia is again noted. Mild interstitial edema and bibasilar airspace disease is present. There is some improved aeration at the lung bases bilaterally.  A right pleural effusion persists.  IMPRESSION:  1.  Status post extubation. 2.  Interval placement of NG tube. 3.  Slight improved aeration with persistent mild edema and bibasilar airspace disease.  Original  Report Authenticated By: Jamesetta Orleans. MATTERN, M.D.   Dg Chest Port 1 View  06/04/2011  *RADIOLOGY REPORT*  Clinical Data: 71 year old female - respiratory failure.  Evaluate endotracheal tube placement.  PORTABLE CHEST - 1 VIEW  Comparison: 06/03/2011 and prior chest radiographs dating back to 06/01/2011  Findings: An endotracheal tube is again identified with tip 4 cm above the carina. Pulmonary vascular congestion and pulmonary edema have slightly increased.  Dextrocardia and right aortic arch are again noted. A right IJ central venous catheter is unchanged with tip overlying the mid SVC on the left. Left lower lung consolidation/atelectasis is unchanged. There is no evidence of pneumothorax.  IMPRESSION: Slightly increased pulmonary vascular congestion/edema without other significant change.  Continued left lower lung atelectasis/consolidation.  Original Report Authenticated By: Rosendo Gros, M.D.   Dg Chest Port 1 View  06/03/2011  *RADIOLOGY REPORT*  Clinical Data: Status post ERCP.  Interval intubation.  PORTABLE CHEST - 1 VIEW  Comparison: Portal chest 06/03/2011.  Findings: An endotracheal tube has in place.  The tip is 4.7 cm above the carina, in satisfactory position.  The right IJ line is stable position.  Dextrocardia is again noted.  Mild interstitial edema is stable.  A right pleural effusion is unchanged.  IMPRESSION:  1.  Interval intubation without radiographic evidence for complication. 2.  Stable appearance of interstitial edema and right pleural effusion.  Original Report Authenticated By: Jamesetta Orleans. MATTERN, M.D.   Dg Chest Port 1 View  06/03/2011  *RADIOLOGY REPORT*  Clinical Data: Cough.  Vomiting.  PORTABLE CHEST - 1 VIEW  Comparison: 12/29 and 06/01/2011  Findings: Since prior study the patient has developed diffuse accentuation of the interstitial markings consistent with interstitial pulmonary edema.  Again noted is dextrocardia.  Central venous catheter is unchanged.  Increased atelectasis at the bases particularly on the right.  New small right effusion.  IMPRESSION: New pulmonary edema and right effusion.  Increased atelectasis at the bases.  Original Report Authenticated By: Gwynn Burly, M.D.   Dg Chest Portable 1 View  06/02/2011  *RADIOLOGY REPORT*  Clinical Data: Central line placement  PORTABLE CHEST - 1 VIEW  Comparison: 06/01/2011  Findings: Dextrocardia with right sided aortic arch.  Elevation of left hemidiaphragm.  Overall shallow inspiration.  Interval insertion of right  central venous catheter with tip over the left sided SVC.  No pneumothorax.  IMPRESSION: Dextrocardia with right sided aortic arch.  Right central venous catheter with tip over the mid left sided SVC.  No pneumothorax. Examination is otherwise stable since previous study.  Original Report Authenticated By: Marlon Pel, M.D.   Dg Ercp With Sphincterotomy  06/03/2011  *RADIOLOGY REPORT*  Clinical Data: Common bile duct stones.  Situs inversus.  ERCP  Comparison:  MRCP dated 06/02/2011  Technique:  Multiple spot images obtained with the fluoroscopic device and submitted for interpretation post-procedure.  ERCP was performed by Dr. Ewing Schlein.  Findings: The ERCP does demonstrate a stone in the common bile duct.  Sphincterotomy and balloon extraction of the stone were performed.  IMPRESSION: Common bile duct stone removed after sphincterotomy.  These images were submitted for radiologic interpretation only. Please see the procedural report for the amount of contrast and the fluoroscopy time utilized.  Original Report Authenticated By: Gwynn Burly, M.D.    EKG:     Assessment and Plan:   +TRoponin in setting of SIRS hypotensin and subsequent ERCP  She also has modest global lv dysfunction which could also be 2/2 SIRS as could +Tn  The issue related to surgical risk would likely be worse if this were oobstruction related as opposed to metabolic mismatch whihch is the more  likely She also is modesstly tachypneic with some pursing and wonder whethe this is a CHF issue as susggested by CXR  We will 1)increse diuresis 2) consider myoview on Friday 3) begin ace-inh therapy 4) consider betalockes later 5) continue asa but dont feel neceessary to  use heparin full dose post ERCP    Sherryl Manges

## 2011-06-06 NOTE — Progress Notes (Signed)
Patient ID: [name], female   DOB: 12/06/1940, 71 y.o.   MRN: [id number] [facility] Surgery Progress Note:   3 Days Post-Op  Subjective: Alert and talkative but coughing with what appears to be a productive cough.  Sister at bedside on whom I did cholecystectomy.   Objective: Vital signs in last 24 hours: Temp:  [98.4 F (36.9 C)-99.1 F (37.3 C)] 98.8 F (37.1 C) (01/02 0800) Pulse Rate:  [90-104] 90  (01/02 0800) Resp:  [19-38] 19  (01/02 0800) BP: (106-134)/(57-77) 115/66 mmHg (01/02 0800) SpO2:  [92 %-99 %] 98 % (01/02 0817) Weight:  [249 lb 12.5 oz (113.3 kg)] 249 lb 12.5 oz (113.3 kg) (01/02 0450)  Intake/Output from previous day: 01/01 0701 - 01/02 0700 In: 1570 [I.V.:720; IV Piggyback:850] Out: 800 [Urine:800] Intake/Output this shift: Total I/O In: 40 [I.V.:40] Out: -   Physical Exam:  Abdomen not tender Lab Results:   Basename 06/06/11 0510 06/05/11 0500  WBC 11.4* 14.8*  HGB 10.8* 10.5*  HCT 33.5* 32.2*  PLT 235 205   BMET  Basename 06/06/11 0510 06/05/11 0500  NA 143 142  K 4.1 4.1  CL 113* 113*  CO2 21 19  GLUCOSE 109* 155*  BUN 37* 36*  CREATININE 1.34* 1.53*  CALCIUM 8.7 8.7   PT/INR No results found for this basename: LABPROT:2,INR:2 in the last 72 hours Studies/Results: Dg Chest Port 1 View  06/06/2011  *RADIOLOGY REPORT*  Clinical Data: Short of breath.  PORTABLE CHEST - 1 VIEW  Comparison: 06/05/2011  Findings: Dextrocardia and right-sided aortic arch again noted. Despite lighter radiographic technique, there is been mild worsening of bilateral pulmonary air space disease, suspicious for increased pulmonary edema.  Bibasilar atelectasis and probable small bilateral pleural effusions are also noted.  Cardiomegaly stable.  Right jugular center venous catheter remains in appropriate position.  Nasogastric tube is no longer visualized.  IMPRESSION:  1.  Increased in bilateral airspace opacity, likely due to pulmonary edema. 2.  Stable  cardiomegaly. 3.  Bibasilar atelectasis and probable small bilateral pleural effusions.  Original Report Authenticated By: Danae Orleans, M.D.   Dg Chest Port 1 View  06/05/2011  *RADIOLOGY REPORT*  Clinical Data: Status post extubation.  PORTABLE CHEST - 1 VIEW  Comparison: Portal chest 06/04/2011.  Findings: The patient has been extubated.  A right IJ line is stable.  An NG tube is now in place.  Dextrocardia is again noted. Mild interstitial edema and bibasilar airspace disease is present. There is some improved aeration at the lung bases bilaterally.  A right pleural effusion persists.  IMPRESSION:  1.  Status post extubation. 2.  Interval placement of NG tube. 3.  Slight improved aeration with persistent mild edema and bibasilar airspace disease.  Original Report Authenticated By: Jamesetta Orleans. MATTERN, M.D.   Anti-infectives: Anti-infectives     Start     Dose/Rate Route Frequency Ordered Stop   06/05/11 1300   ciprofloxacin (CIPRO) IVPB 400 mg     Comments: pharamcy dose      400 mg 200 mL/hr over 60 Minutes Intravenous Every 12 hours 06/05/11 1130     06/05/11 1300   metroNIDAZOLE (FLAGYL) IVPB 500 mg        500 mg 100 mL/hr over 60 Minutes Intravenous 4 times per day 06/05/11 1130     06/04/11 1200   imipenem-cilastatin (PRIMAXIN) 250 mg in sodium chloride 0.9 % 100 mL IVPB  Status:  Discontinued  250 mg 200 mL/hr over 30 Minutes Intravenous 4 times per day 06/04/11 1031 06/05/11 1130   06/02/11 2200   imipenem-cilastatin (PRIMAXIN) 500 mg in sodium chloride 0.9 % 100 mL IVPB  Status:  Discontinued        500 mg 200 mL/hr over 30 Minutes Intravenous 3 times per day 06/02/11 1226 06/04/11 1031   06/02/11 0600   metroNIDAZOLE (FLAGYL) IVPB 500 mg  Status:  Discontinued        500 mg 100 mL/hr over 60 Minutes Intravenous Every 8 hours 06/02/11 0504 06/02/11 0511   06/02/11 0600   imipenem-cilastatin (PRIMAXIN) 500 mg in sodium chloride 0.9 % 100 mL IVPB  Status:  Discontinued         500 mg 200 mL/hr over 30 Minutes Intravenous 4 times per day 06/02/11 0513 06/02/11 1226   06/02/11 0445   metroNIDAZOLE (FLAGYL) IVPB 500 mg  Status:  Discontinued        500 mg 100 mL/hr over 60 Minutes Intravenous  Once 06/02/11 0431 06/02/11 0432   06/02/11 0430   ertapenem (INVANZ) 1 g in sodium chloride 0.9 % 50 mL IVPB  Status:  Discontinued        1 g 100 mL/hr over 30 Minutes Intravenous Daily 06/02/11 0400 06/02/11 0512   06/01/11 2230   ciprofloxacin (CIPRO) IVPB 400 mg        400 mg 200 mL/hr over 60 Minutes Intravenous  Once 06/01/11 2215 06/02/11 0015   06/01/11 2230   metroNIDAZOLE (FLAGYL) IVPB 500 mg  Status:  Discontinued        500 mg 100 mL/hr over 60 Minutes Intravenous Every 8 hours 06/01/11 2215 06/02/11 0402          Assessment/Plan: Problem List: There is no problem list on file for this patient.   Trending better but not ready for surgery.  Needs to move further out of this acute phase and then will do cholecystecomy  3 Days Post-Op    LOS: 5 days   Matt B. Daphine Deutscher, MD, [facility] [phone] beeper [phone]  06/06/2011 9:23 AM

## 2011-06-06 NOTE — Progress Notes (Signed)
Occupational Therapy Evaluation Patient Details Name: [name] MRN: [id number] DOB: 01-15-1941 Today's Date: 06/06/2011  Problem List: There is no problem list on file for this patient.   Past Medical History:  Past Medical History  Diagnosis Date  . Arthritis   . Neuropathy   . Reflux   . Hypertension   . Elevated cholesterol   . Situs inversus    Past Surgical History:  Past Surgical History  Procedure Date  . Elbow fracture surgery   . Vaginal hysterectomy     OT Assessment/Plan/Recommendation OT Assessment Clinical Impression Statement: Pt. will benefit from OT to increase functional independence with ADLs and get pt. to supervision level at D/C OT Recommendation/Assessment: Patient will need skilled OT in the acute care venue OT Problem List: Decreased strength;Decreased activity tolerance;Impaired balance (sitting and/or standing);Decreased safety awareness;Decreased knowledge of use of DME or AE;Cardiopulmonary status limiting activity Barriers to Discharge: None OT Therapy Diagnosis : Generalized weakness OT Plan OT Frequency: Min 2X/week OT Treatment/Interventions: Self-care/ADL training;Energy conservation;DME and/or AE instruction;Therapeutic activities;Patient/family education;Balance training OT Recommendation Recommendations for Other Services: Rehab consult Follow Up Recommendations: Inpatient Rehab Equipment Recommended: Defer to next venue Individuals Consulted Consulted and Agree with Results and Recommendations: Patient OT Goals Acute Rehab OT Goals OT Goal Formulation: With patient Time For Goal Achievement: 2 weeks ADL Goals Pt Will Perform Grooming: with supervision;Standing at sink ADL Goal: Grooming - Progress: Progressing toward goals Pt Will Perform Lower Body Bathing: with supervision;Sit to stand from bed ADL Goal: Lower Body Bathing - Progress: Not met Pt Will Perform Lower Body Dressing: with supervision;with set-up;with adaptive  equipment;Sit to stand from bed ADL Goal: Lower Body Dressing - Progress: Not met Pt Will Transfer to Toilet: with supervision;Stand pivot transfer;3-in-1 ADL Goal: Toilet Transfer - Progress: Progressing toward goals Pt Will Perform Toileting - Hygiene: with set-up;Sit to stand from 3-in-1/toilet ADL Goal: Toileting - Hygiene - Progress: Not met  OT Evaluation Precautions/Restrictions  Precautions Precautions: Fall Restrictions Weight Bearing Restrictions: Yes Prior Functioning Home Living Lives With: Spouse Type of Home: Apartment Home Layout: One level Home Access: Stairs to enter Entrance Stairs-Rails: Can reach both Entrance Stairs-Number of Steps: 4 Bathroom Shower/Tub: Counselling psychologist: Yes How Accessible: Accessible via walker Home Adaptive Equipment: Shower chair with back;Straight cane Prior Function Level of Independence: Independent with basic ADLs Able to Take Stairs?: Yes Driving: Yes Vocation: Retired ADL ADL Eating/Feeding: NPO Grooming: Performed;Wash/dry face Where Assessed - Grooming: Sitting, bed Upper Body Bathing: Simulated;Chest;Right arm;Left arm;Abdomen;Moderate assistance Where Assessed - Upper Body Bathing: Sitting, bed Lower Body Bathing: Simulated;+1 Total assistance Where Assessed - Lower Body Bathing: Sit to stand from bed Upper Body Dressing: Simulated;Minimal assistance Upper Body Dressing Details (indicate cue type and reason): with donning gown Where Assessed - Upper Body Dressing: Sitting, bed Lower Body Dressing: Performed;Maximal assistance Lower Body Dressing Details (indicate cue type and reason): Pt able to bring foot up towards opposite knee to complete however needed assist to complete Where Assessed - Lower Body Dressing: Sit to stand from bed Toilet Transfer: Simulated;+2 Total assistance;Comment for patient % (pt=70%) Toilet Transfer Details (indicate cue type and  reason): Max verbal cues for hand placement and technique Toilet Transfer Method: Stand pivot Toilet Transfer Equipment: Other (comment) Nurse, children's) Toileting - Clothing Manipulation: Simulated;Minimal assistance Toileting - Clothing Manipulation Details (indicate cue type and reason): moving gown Where Assessed - Glass blower/designer Manipulation: Standing Toileting - Hygiene: Simulated;Maximal assistance Where Assessed - Toileting Hygiene: Standing Tub/Shower Transfer:  Not assessed Tub/Shower Transfer Method: Not assessed ADL Comments: Pt with SOB and on 4LO2 throughout session. Pt. educated on energy conservation techniques with ADLs and will benefit from further education. Vision/Perception  Vision - History Baseline Vision: No visual deficits Patient Visual Report: No change from baseline Vision - Assessment Eye Alignment: Within Functional Limits Vision Assessment: Vision not tested Cognition Cognition Arousal/Alertness: Awake/alert Overall Cognitive Status: Appears within functional limits for tasks assessed Orientation Level: Oriented X4 Sensation/Coordination Sensation Light Touch: Appears Intact Extremity Assessment RUE Assessment RUE Assessment:  (defer OT) LUE Assessment LUE Assessment:  (defer to OT) Mobility  Bed Mobility Bed Mobility: Yes Supine to Sit: 1: +2 Total assist Supine to Sit Details (indicate cue type and reason): +2totalpt70% with cues for sequencing and follow through faciitation Sitting - Scoot to Edge of Bed: 4: Min assist Sitting - Scoot to [place] of Bed Details (indicate cue type and reason): use of pad to assist with scoot  Transfers Transfers: Yes Sit to Stand: 1: +2 Total assist;With upper extremity assist;From bed Sit to Stand Details (indicate cue type and reason): +2totalpt70% with cueing for anterior translation and follow through to stand; pt initally standing with posterior lean and cues at hips to bring weight anterior Stand to Sit: 1: +2  Total assist;To chair/3-in-1;With armrests;With upper extremity assist Stand to Sit Details: +2totalpt75% to reach back and control descent to chair Exercises Total Joint Exercises Ankle Circles/Pumps: AROM;Both;5 reps;Supine Heel Slides: AROM;Both;5 reps;Supine End of Session OT - End of Session Equipment Utilized During Treatment: Gait belt (Rt. UE splint ) Activity Tolerance: Patient tolerated treatment well Patient left: in chair;with call bell in reach;with family/visitor present Nurse Communication: Mobility status for transfers General Behavior During Session: [facility] for tasks performed Cognition: [facility] for tasks performed   Shaine Newmark, OTR/L Pager [phone] 06/06/2011, 10:57 AM

## 2011-06-06 NOTE — Progress Notes (Signed)
[name] is an 71 y.o. female.   Chief Complaint: Abdominal pain, hypotension  HPI: Pt is a 70 y/o F withnPMHx of HTN, DL who came to the ER on 16/10 with Hx of 1 day of epigastric abdominal radiated to the back associated with nausea and vomiting. Pt also reported feeling warm, but denied any chills, cough, sputum production or syncopal episodes. Pt was found to have elevated liver enzymes, elevated WBC, lactate 5, RUQ US showed dilated bile duct and cholelithiasis, confirmed on CT abdomen with also mild infiltration of the apncreas with possible early pancreatitis. Pt became hypotensive in the ER in the 80's/50', tachycardic and was given 4L of NS with no improvement. Pt was started on Dopamine through peripheral line and CCM was called.   Lines/tubes Right IJ 12/29>>>1/2 planned Oral ETT 12/30>>>12/31  Culture data mrsa screen 12/29: negative UC 12/29:>>> BCX1 12/29>>>  Antibiotics primaxin 12/29 (sepsis/PCN allergy)>>>1/1 cipro 1/1>>> Flagyl 1/1>>>  Consults GI and gen surg  Events/studies 06/01/2011  .  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 Cholelithiasis without obvious bile duct dilatation.  Suggestion of mild infiltration around the head of the pancreas which could represent early changes of pancreatitis.  Laboratory correlation recommended.  Infiltration or atelectasis in both lung bases.  Original Report Authenticated By: Marlon Pel, M.D.  06/01/2011   COMPLETE ABDOMINAL ULTRASOUND  IMPRESSION: Technically challenging examination as described above.  Cholelithiasis without definitive sonographic evidence for cholecystitis.  The CBD is mildly distended up to 8 mm.  No obstructing lesion identified however the distal duct is obscured by overlying bowel gas.Correlate with LFTs and ERCP or MRCP if clinically warranted.  Original Report Authenticated By: Waneta Martins, M.D.  12/29 MRI abd:   1. The study does demonstrate a single small calculus in the distal common bile duct. There is no significant biliary dilatation.2. Cholelithiasis without evidence of cholecystitis.3. Nonspecific retroperitoneal edema surrounding the pancreatic head could represent early/mild pancreatitis. No focal fluid collection is identified. ERCP 12/30: Findings: The ERCP does demonstrate a stone in the common bile duct. Sphincterotomy and balloon extraction of the stone were  performed.  IMPRESSION:  Common bile duct stone removed after sphincterotomy.  Subjective 12/31- extubated successfully 1/1- pos balance  Objective  Temp:  [98.4 F (36.9 C)-99.1 F (37.3 C)] 99 F (37.2 C) (01/02 1000) Pulse Rate:  [90-104] 97  (01/02 1000) Resp:  [19-38] 22  (01/02 1000) BP: (106-134)/(57-77) 121/66 mmHg (01/02 1026) SpO2:  [92 %-98 %] 92 % (01/02 1026) Weight:  [113.3 kg (249 lb 12.5 oz)] 249 lb 12.5 oz (113.3 kg) (01/02 0450)    . sodium chloride 100 mL/hr at 06/03/11 1234  . sodium chloride    . Levophed gtt 18mcg/min      Intake/Output Summary (Last 24 hours) at 06/06/11 1147 Last data filed at 06/06/11 1000  Gross per 24 hour  Intake   1330 ml  Output    700 ml  Net    630 ml    Physical Exam  Pt is alert HEENT: no jvd, no distress CV: S1 and S2 normal, HR sounds on rt Lungs: crackles Abdomen: Tender on palpation resolved in the LUQ and epigastrium. wnl bowel sounds, no peritoneal signs. Extrem: no low extrem edema, no calf tenderness. Neuro: no focal deficit.    Labs  Lab 06/06/11 0510 06/05/11 0500 06/04/11 0430  NA 143 142 140  K 4.1 4.1 4.6  CL 113* 113* 114*  CO2 21 19 18*  BUN 37* 36* 32*  CREATININE 1.34* 1.53* 1.78*  GLUCOSE 109* 155* 143*    Lab 06/06/11 0510 06/05/11 0500 06/04/11 0430  HGB 10.8* 10.5* 11.1*  HCT 33.5* 32.2* 33.9*  WBC 11.4* 14.8* 20.3*  PLT 235 205 223   PCXR:ETT and CVL in good position. Right effusion, increased bilateral airspace disease.     Assessment/Plan  VDRF: post ERCP, patient was in a prone position and anesthesia elected to keep intubate.  Extubated doing well Lower O2 needs May need lasix, added, needs neg balance was sligh pos again pcxr layering effusion? Int edema?  SIRS/Sepsis/Septic shock in setting of Cholangitis and gallstone pancreatitis requiring high dose levophed s/p ERCP.  Resolved Dc line if able Taper steroids to q12h, is on home pred, will need to get to pred when taking oral Reduce to daily  Lab 06/06/11 0510 06/05/11 0500 06/04/11 0430  WBC 11.4* 14.8* 20.3*    Lab 06/04/11 0430 06/02/11 0400 06/01/11 2305  PROCALCITON 12.91 11.79 5.06    kvo Lasix Chem in am   Cholecysttitis, Cholangitis Enzymes and bili trending down Improved clinicaly, pressors etc Can we feed?  Adrenal insufficiency.  Plan: Stress roids to 25 q12h then to home pred when able To qday then pred home dose  ARF: in setting of shock: ATN likley Recent Labs  Basename 06/06/11 0510 06/05/11 0500 06/04/11 0430   CREATININE 1.34* 1.53* 1.78*  plan: -kvo Resolving No NS with cl113 lasix  Anemia: got one unit PRBC on 12/29  Lab 06/06/11 0510 06/05/11 0500 06/04/11 0430  HGB 10.8* 10.5* 11.1*  plan: -transfuse for hgb goal of > 7 sub q hep Avoid phlebotmoy  Hyperglycemia CBG (last 3)   Basename 06/06/11 0829 06/06/11 0425 06/05/11 2346  GLUCAP 77 126* 105*  Plan: ssi protocol add lantus tolerated, now low, reduce or dc   Trop noted in setting renal failure No arrythmia Echo- Left ventricle: The cavity size was normal. Wall thickness was normal. Systolic function was mildly to moderately reduced. The estimated ejection fraction was in the range of 40% to 45%. There is hypokinesis of the anteroseptal myocardium. There is hypokinesis of the apical myocardium. - Mitral valve: Calcified annulus. - Left atrium: The atrium was mildly dilated.  Will call cards, clearance as well for surgery likely  an issue  Fractured right elbow Plan -has f/u with ortho on 1/3 -keep in immobilized position for comfort ot pt called  Best practice DVT prophylaxis:  SCDs Sub q h   To triad in am , will sign off   To floor  Mcarthur Rossetti. Tyson Alias, MD, FACP Pgr: [phone] [facility]   Nelda Bucks., M.D. [phone]

## 2011-06-06 NOTE — Progress Notes (Signed)
Heart Failure Core Measure: EF 40-45%.  Currently holding ACE Inhibitor 2/2 to ARF and hypotension.    Fayne Norrie, PharmD, BCPS 06/06/2011, 11:57 AM

## 2011-06-06 NOTE — Progress Notes (Signed)
Physical Therapy Evaluation Patient Details Name: [name] MRN: [id number] DOB: Aug 20, 1940 Today's Date: 06/06/2011  Problem List: There is no problem list on file for this patient.   Past Medical History:  Past Medical History  Diagnosis Date  . Arthritis   . Neuropathy   . Reflux   . Hypertension   . Elevated cholesterol   . Situs inversus    Past Surgical History:  Past Surgical History  Procedure Date  . Elbow fracture surgery   . Vaginal hysterectomy     PT Assessment/Plan/Recommendation PT Assessment Clinical Impression Statement: Pt presents to [facility] with abdominal pain and plans for cholesystectomy following fall and elbow fracture 3 weeks ago. Pt generally weak with decreased activity tolerance as well as limited by RUE in splint. Would benefit continued rehab while in acute setting to maximize strength and mobility and to prep pt for surgery to prevent complications following surgery. It is difficult to determine what level of care pt may need until after surgery, but at this time she would benefit from a CIR consult. Pt will have husband with her full time once going home.  PT Recommendation/Assessment: Patient will need skilled PT in the acute care venue PT Problem List: Decreased strength;Decreased range of motion;Decreased activity tolerance;Decreased balance;Decreased mobility;Decreased knowledge of use of DME;Cardiopulmonary status limiting activity PT Therapy Diagnosis : Difficulty walking;Abnormality of gait;Generalized weakness PT Plan PT Frequency: Min 3X/week PT Treatment/Interventions: DME instruction;Gait training;Functional mobility training;Stair training;Therapeutic activities;Therapeutic exercise;Balance training;Patient/family education;Neuromuscular re-education PT Recommendation Follow Up Recommendations: Inpatient Rehab Equipment Recommended: Defer to next venue PT Goals  Acute Rehab PT Goals PT Goal Formulation: With patient/family Pt will  go Supine/Side to Sit: with modified independence PT Goal: Supine/Side to Sit - Progress: Progressing toward goal Pt will go Sit to Supine/Side: with modified independence PT Goal: Sit to Supine/Side - Progress: Progressing toward goal Pt will go Sit to Stand: with modified independence PT Goal: Sit to Stand - Progress: Progressing toward goal Pt will go Stand to Sit: with modified independence PT Goal: Stand to Sit - Progress: Progressing toward goal Pt will Transfer Bed to Chair/Chair to Bed: with modified independence PT Transfer Goal: Bed to Chair/Chair to Bed - Progress: Progressing toward goal Pt will Stand: with modified independence;6 - 10 min;with no upper extremity support PT Goal: Stand - Progress: Progressing toward goal Pt will Ambulate: 51 - 150 feet;with supervision;with least restrictive assistive device PT Goal: Ambulate - Progress: Progressing toward goal Pt will Go Up / Down Stairs: 3-5 stairs;with min assist;with least restrictive assistive device PT Goal: Up/Down Stairs - Progress: Progressing toward goal Pt will Perform Home Exercise Program: Independently PT Goal: Perform Home Exercise Program - Progress: Progressing toward goal  PT Evaluation Precautions/Restrictions  Precautions Precautions: Fall Restrictions Weight Bearing Restrictions: Yes Prior Functioning  Home Living Lives With: Spouse Type of Home: Apartment Home Layout: One level Home Access: Stairs to enter Entrance Stairs-Rails: Can reach both Entrance Stairs-Number of Steps: 4 Bathroom Shower/Tub: Counselling psychologist: Yes How Accessible: Accessible via walker Home Adaptive Equipment: Shower chair with back;Straight cane Prior Function Level of Independence: Independent with basic ADLs Able to Take Stairs?: Yes Driving: Yes Vocation: Retired Financial risk analyst Arousal/Alertness: Awake/alert Overall Cognitive Status: Appears within  functional limits for tasks assessed Orientation Level: Oriented X4 Sensation/Coordination Sensation Light Touch: Appears Intact Extremity Assessment RUE Assessment RUE Assessment:  (defer OT) LUE Assessment LUE Assessment:  (defer to OT) RLE Assessment RLE Assessment:  (grossly 4/5) LLE  Assessment LLE Assessment:  (grossly 4/5) Mobility (including Balance) Bed Mobility Bed Mobility: Yes Supine to Sit: 1: +2 Total assist Supine to Sit Details (indicate cue type and reason): +2totalpt70% with cues for sequencing and follow through faciitation Sitting - Scoot to Edge of Bed: 4: Min assist Sitting - Scoot to [place] of Bed Details (indicate cue type and reason): use of pad to assist with scoot  Transfers Transfers: Yes Sit to Stand: 1: +2 Total assist;With upper extremity assist;From bed Sit to Stand Details (indicate cue type and reason): +2totalpt70% with cueing for anterior translation and follow through to stand; pt initally standing with posterior lean and cues at hips to bring weight anterior Stand to Sit: 1: +2 Total assist;To chair/3-in-1;With armrests;With upper extremity assist Stand to Sit Details: +2totalpt75% to reach back and control descent to chair Ambulation/Gait Ambulation/Gait: Yes Ambulation/Gait Assistance: 1: +2 Total assist Ambulation/Gait Assistance Details (indicate cue type and reason): pt took 6-8 pivotal steps around to left bed->chair with cueing at hips for upright posture and to prevent posterior LOB; pt moving very slowly and cautiously Assistive device: None  Posture/Postural Control Posture/Postural Control: No significant limitations Balance Balance Assessed: Yes Static Sitting Balance Static Sitting - Balance Support: No upper extremity supported Static Sitting - Level of Assistance: 5: Stand by assistance Static Sitting - Comment/# of Minutes: pt able to brush hair sitting EOB without LOB Static Standing Balance Static Standing - Balance Support:  Left upper extremity supported Static Standing - Level of Assistance: 1: +2 Total assist Static Standing - Comment/# of Minutes: +2totalpt70% Exercise  Total Joint Exercises Ankle Circles/Pumps: AROM;Both;5 reps;Supine Heel Slides: AROM;Both;5 reps;Supine End of Session PT - End of Session Equipment Utilized During Treatment: Gait belt Activity Tolerance: Patient tolerated treatment well;Patient limited by fatigue Patient left: in chair;with call bell in reach;with family/visitor present Nurse Communication: Mobility status for transfers General Behavior During Session: [facility] for tasks performed Cognition: [facility] for tasks performed  [facility] HELEN 06/06/2011, 10:39 AM

## 2011-06-07 ENCOUNTER — Inpatient Hospital Stay (HOSPITAL_COMMUNITY): Payer: Medicare Other

## 2011-06-07 ENCOUNTER — Encounter (HOSPITAL_COMMUNITY): Payer: Self-pay | Admitting: Physician Assistant

## 2011-06-07 DIAGNOSIS — R7989 Other specified abnormal findings of blood chemistry: Secondary | ICD-10-CM

## 2011-06-07 LAB — COMPREHENSIVE METABOLIC PANEL
Alkaline Phosphatase: 204 U/L — ABNORMAL HIGH (ref 39–117)
BUN: 34 mg/dL — ABNORMAL HIGH (ref 6–23)
Chloride: 102 mEq/L (ref 96–112)
Creatinine, Ser: 1.41 mg/dL — ABNORMAL HIGH (ref 0.50–1.10)
GFR calc Af Amer: 43 mL/min — ABNORMAL LOW (ref >90)
Glucose, Bld: 139 mg/dL — ABNORMAL HIGH (ref 70–99)
Potassium: 3.3 mEq/L — ABNORMAL LOW (ref 3.5–5.1)
Total Bilirubin: 1.1 mg/dL (ref 0.3–1.2)

## 2011-06-07 LAB — GLUCOSE, CAPILLARY
Glucose-Capillary: 188 mg/dL — ABNORMAL HIGH (ref 70–99)
Glucose-Capillary: 181 mg/dL — ABNORMAL HIGH (ref 70–99)

## 2011-06-07 LAB — CBC
HCT: 37.8 % (ref 36.0–46.0)
Hemoglobin: 12.6 g/dL (ref 12.0–15.0)
MCHC: 33.3 g/dL (ref 30.0–36.0)
MCV: 90.6 fL (ref 78.0–100.0)
WBC: 14.1 10*3/uL — ABNORMAL HIGH (ref 4.0–10.5)

## 2011-06-07 LAB — PHOSPHORUS: Phosphorus: 3.8 mg/dL (ref 2.3–4.6)

## 2011-06-07 MED ORDER — POTASSIUM CHLORIDE CRYS ER 20 MEQ PO TBCR
40.0000 meq | EXTENDED_RELEASE_TABLET | ORAL | Status: AC
  Administered 2011-06-07: 40 meq via ORAL
  Filled 2011-06-07: qty 2

## 2011-06-07 MED ORDER — POTASSIUM CHLORIDE 20 MEQ/15ML (10%) PO LIQD
ORAL | Status: AC
  Administered 2011-06-07 (×2): 20 meq
  Filled 2011-06-07: qty 30

## 2011-06-07 MED ORDER — SODIUM CHLORIDE 0.9 % IV SOLN
2.0000 g | Freq: Once | INTRAVENOUS | Status: AC
  Administered 2011-06-07: 2 g via INTRAVENOUS
  Filled 2011-06-07: qty 4

## 2011-06-07 MED ORDER — REGADENOSON 0.4 MG/5ML IV SOLN
0.4000 mg | Freq: Once | INTRAVENOUS | Status: AC
  Administered 2011-06-08: 0.4 mg via INTRAVENOUS
  Filled 2011-06-07: qty 5

## 2011-06-07 MED ORDER — SODIUM CHLORIDE 0.9 % IJ SOLN
INTRAMUSCULAR | Status: AC
  Administered 2011-06-07: 19:00:00
  Filled 2011-06-07: qty 10

## 2011-06-07 NOTE — Progress Notes (Signed)
Subjective: Chart reviewed, 71 y/o f admitted with abdominal pain, SIRS, found to have choledocholethiasis/ cholangitis.s/p ERCP, sphincterotomy, CBD stone extraction.Now plan for cholecystectomy during this admission, once she is medically stable. Also found to have hypokinesis on echo. Cardiology has been consulted.possible myoview  on Friday. Patient denies any nausea, vomiting today. No abdominal pain.  Objective: Vital signs in last 24 hours: Temp:  [97.5 F (36.4 C)-99.7 F (37.6 C)] 98 F (36.7 C) (01/03 0341) Pulse Rate:  [95-122] 105  (01/03 0341) Resp:  [17-27] 23  (01/03 0341) BP: (93-133)/(61-88) 119/77 mmHg (01/03 0341) SpO2:  [90 %-98 %] 95 % (01/03 0341) Weight:  [103.2 kg (227 lb 8.2 oz)-105.4 kg (232 lb 5.8 oz)] 232 lb 5.8 oz (105.4 kg) (01/03 0341) Weight change: -10.1 kg (-22 lb 4.3 oz) Last BM Date: 06/01/11  Intake/Output from previous day: 01/02 0701 - 01/03 0700 In: 1326 [P.O.:180; I.V.:320; IV Piggyback:826] Out: 8300 [Urine:8300]     Physical Exam: General: Alert, awake, oriented x3, in no acute distress. HEENT: No bruits, no goiter. Heart: Regular rate and rhythm, without murmurs, rubs, gallops. Lungs: Clear to auscultation bilaterally. Abdomen: Soft, nontender, nondistended, positive bowel sounds. Extremities: No clubbing cyanosis or edema with positive pedal pulses. Neuro: Grossly intact, nonfocal.    Lab Results: Basic Metabolic Panel:  Basename 06/07/11 0600 06/06/11 0510  NA 144 143  K 3.3* 4.1  CL 102 113*  CO2 29 21  GLUCOSE 139* 109*  BUN 34* 37*  CREATININE 1.41* 1.34*  CALCIUM 8.9 8.7  MG 1.3* --  PHOS 3.8 --   Liver Function Tests:  Basename 06/07/11 0600 06/06/11 0510  AST 20 16  ALT 26 32  ALKPHOS 204* 192*  BILITOT 1.1 0.8  PROT 6.3 5.5*  ALBUMIN 2.5* 2.1*   No results found for this basename: LIPASE:2,AMYLASE:2 in the last 72 hours No results found for this basename: AMMONIA:2 in the last 72 hours CBC:  Basename  06/07/11 0600 06/06/11 0510 06/05/11 0500  WBC 14.1* 11.4* --  NEUTROABS -- 9.3* 12.9*  HGB 12.6 10.8* --  HCT 37.8 33.5* --  MCV 90.6 91.8 --  PLT 270 235 --   CBG:  Basename 06/07/11 0725 06/07/11 0344 06/06/11 2354 06/06/11 1949 06/06/11 1548 06/06/11 1231  GLUCAP 188* 144* 119* 100* 123* 102*    Recent Results (from the past 240 hour(s))  CULTURE, BLOOD (ROUTINE X 2)     Status: Normal (Preliminary result)   Collection Time   06/02/11  5:00 AM      Component Value Range Status Comment   Specimen Description BLOOD RIGHT ARM   Final    Special Requests BOTTLES DRAWN AEROBIC AND ANAEROBIC 10CC   Final    Setup Time [id number]   Final    Culture     Final    Value:        BLOOD CULTURE RECEIVED NO GROWTH TO DATE CULTURE WILL BE HELD FOR 5 DAYS BEFORE ISSUING A FINAL NEGATIVE REPORT   Report Status PENDING   Incomplete   URINE CULTURE     Status: Normal   Collection Time   06/02/11  6:14 AM      Component Value Range Status Comment   Specimen Description URINE, CATHETERIZED   Final    Special Requests NONE   Final    Setup Time [id number]   Final    Colony Count NO GROWTH   Final    Culture NO GROWTH   Final  Report Status 06/03/2011 FINAL   Final   MRSA PCR SCREENING     Status: Normal   Collection Time   06/02/11  8:40 AM      Component Value Range Status Comment   MRSA by PCR NEGATIVE  NEGATIVE  Final   CULTURE, SPUTUM-ASSESSMENT     Status: Normal   Collection Time   06/02/11 11:05 PM      Component Value Range Status Comment   Specimen Description SPUTUM   Final    Special Requests NONE   Final    Sputum evaluation     Final    Value: MICROSCOPIC FINDINGS SUGGEST THAT THIS SPECIMEN IS NOT REPRESENTATIVE OF LOWER RESPIRATORY SECRETIONS. PLEASE RECOLLECT.     CALLED TO Lianne Bushy RN AT [phone] ON [id number]   Report Status 06/02/2011 FINAL   Final     Studies/Results: Dg Chest Port 1 View  06/06/2011  *RADIOLOGY REPORT*  Clinical Data: Short of breath.  PORTABLE  CHEST - 1 VIEW  Comparison: 06/05/2011  Findings: Dextrocardia and right-sided aortic arch again noted. Despite lighter radiographic technique, there is been mild worsening of bilateral pulmonary air space disease, suspicious for increased pulmonary edema.  Bibasilar atelectasis and probable small bilateral pleural effusions are also noted.  Cardiomegaly stable.  Right jugular center venous catheter remains in appropriate position.  Nasogastric tube is no longer visualized.  IMPRESSION:  1.  Increased in bilateral airspace opacity, likely due to pulmonary edema. 2.  Stable cardiomegaly. 3.  Bibasilar atelectasis and probable small bilateral pleural effusions.  Original Report Authenticated By: Danae Orleans, M.D.    Medications: Scheduled Meds:   . albuterol  2.5 mg Nebulization Q6H  . aspirin  300 mg Rectal Once  . captopril  6.25 mg Oral BID  . ciprofloxacin  400 mg Intravenous Q12H  . furosemide  80 mg Intravenous Q8H  . heparin subcutaneous  5,000 Units Subcutaneous Q8H  . hydrocortisone sod succinate (SOLU-CORTEF) injection  25 mg Intravenous Daily  . insulin aspart  0-15 Units Subcutaneous Q4H  . insulin glargine  5 Units Subcutaneous QHS  . ipratropium  0.5 mg Nebulization Q6H  . metronidazole  500 mg Intravenous Q6H  . pantoprazole (PROTONIX) IV  40 mg Intravenous Q24H  . sodium chloride  1,000 mL Intravenous Once  . DISCONTD: furosemide  40 mg Intravenous Q12H  . DISCONTD: hydrocortisone sod succinate (SOLU-CORTEF) injection  25 mg Intravenous Q12H  . DISCONTD: insulin glargine  15 Units Subcutaneous QHS  . DISCONTD: ondansetron (ZOFRAN) IV  4 mg Intravenous Once  . DISCONTD: ondansetron (ZOFRAN) IV  4 mg Intravenous Once  . DISCONTD: sodium chloride  1,000 mL Intravenous Once  . DISCONTD: sodium chloride  25 mL/kg Intravenous Once  . DISCONTD: sodium chloride  500 mL Intravenous Once   Continuous Infusions:   . sodium chloride 20 mL/hr at 06/06/11 2211  . DISCONTD: sodium  chloride     PRN Meds:.albuterol, ondansetron, DISCONTD: DOBUTamine, DISCONTD: fentaNYL, DISCONTD: midazolam, DISCONTD: norepinephrine (LEVOPHED) Adult infusion, DISCONTD: sodium chloride  Assessment/Plan:  SIRS/Sepstic shock: Resolved. Solucortef tapered to 25 mg daily, patient takes prednisone 5 mg daily at home for rheumatoid arthritis. Will switch to oral prednisone when able to take po.  Cholangitis: Improving, on clear liquid diet. GI and surgery following. LFT's are normal. WBC mildly elevated. Continue Cipro.  CHF: Pt started on diuresis with lasix. Improvement in shortness of breath. Cardiology following Myoview on friday  S/P VDRF: Stable  AKI: Sec to septic  shock Resolving. Cont to monitor.  Anemia: H&H is stable.  Fractured right elbow: Immobilized for comfort F/u Ortho as outpatient.  Hyperglycemia: Continue SSI  DVT Prophylaxis: SCD's     LOS: 6 days   [facility] Pager: [phone] 06/07/2011, 8:02 AM

## 2011-06-07 NOTE — Consult Note (Signed)
Reason for Consult:right elbow pain Referring Physician:Evlyn J Poma is an 71 y.o. female.  HPI: 71 yo elbow fx dx 2 weeks ago. Seen as OPT rec nonop.   Past Medical History  Diagnosis Date  . Arthritis   . Neuropathy   . Reflux   . Hypertension   . Elevated cholesterol   . Situs inversus     Past Surgical History  Procedure Date  . Elbow fracture surgery   . Vaginal hysterectomy   . Ercp 06/03/2011    Procedure: ENDOSCOPIC RETROGRADE CHOLANGIOPANCREATOGRAPHY (ERCP);  Surgeon: Petra Kuba, MD;  Location: [facility] OR;  Service: Endoscopy;  Laterality: N/A;    History reviewed. No pertinent family history.  Social History:  reports that she has never smoked. She does not have any smokeless tobacco history on file. She reports that she does not drink alcohol or use illicit drugs.  Allergies:  Allergies  Allergen Reactions  . Penicillins Anaphylaxis    Medications: I have reviewed the patient's current medications.  Results for orders placed during the hospital encounter of 06/01/11 (from the past 48 hour(s))  GLUCOSE, CAPILLARY     Status: Abnormal   Collection Time   06/05/11  3:59 PM      Component Value Range Comment   Glucose-Capillary 112 (*) 70 - 99 (mg/dL)   GLUCOSE, CAPILLARY     Status: Abnormal   Collection Time   06/05/11  7:46 PM      Component Value Range Comment   Glucose-Capillary 114 (*) 70 - 99 (mg/dL)   GLUCOSE, CAPILLARY     Status: Abnormal   Collection Time   06/05/11 11:46 PM      Component Value Range Comment   Glucose-Capillary 105 (*) 70 - 99 (mg/dL)    Comment 1 Documented in Chart      Comment 2 Notify RN     GLUCOSE, CAPILLARY     Status: Abnormal   Collection Time   06/06/11  4:25 AM      Component Value Range Comment   Glucose-Capillary 126 (*) 70 - 99 (mg/dL)    Comment 1 Documented in Chart      Comment 2 Notify RN     CBC     Status: Abnormal   Collection Time   06/06/11  5:10 AM      Component Value Range Comment   WBC 11.4 (*) 4.0 -  10.5 (K/uL)    RBC 3.65 (*) 3.87 - 5.11 (MIL/uL)    Hemoglobin 10.8 (*) 12.0 - 15.0 (g/dL)    HCT 40.9 (*) 81.1 - 46.0 (%)    MCV 91.8  78.0 - 100.0 (fL)    MCH 29.6  26.0 - 34.0 (pg)    MCHC 32.2  30.0 - 36.0 (g/dL)    RDW 91.4  78.2 - 95.6 (%)    Platelets 235  150 - 400 (K/uL)   COMPREHENSIVE METABOLIC PANEL     Status: Abnormal   Collection Time   06/06/11  5:10 AM      Component Value Range Comment   Sodium 143  135 - 145 (mEq/L)    Potassium 4.1  3.5 - 5.1 (mEq/L)    Chloride 113 (*) 96 - 112 (mEq/L)    CO2 21  19 - 32 (mEq/L)    Glucose, Bld 109 (*) 70 - 99 (mg/dL)    BUN 37 (*) 6 - 23 (mg/dL)    Creatinine, Ser 2.13 (*) 0.50 - 1.10 (mg/dL)  Calcium 8.7  8.4 - 10.5 (mg/dL)    Total Protein 5.5 (*) 6.0 - 8.3 (g/dL)    Albumin 2.1 (*) 3.5 - 5.2 (g/dL)    AST 16  0 - 37 (U/L)    ALT 32  0 - 35 (U/L)    Alkaline Phosphatase 192 (*) 39 - 117 (U/L)    Total Bilirubin 0.8  0.3 - 1.2 (mg/dL)    GFR calc non Af Amer 39 (*) >90 (mL/min)    GFR calc Af Amer 46 (*) >90 (mL/min)   DIFFERENTIAL     Status: Abnormal   Collection Time   06/06/11  5:10 AM      Component Value Range Comment   Neutrophils Relative 82 (*) 43 - 77 (%)    Neutro Abs 9.3 (*) 1.7 - 7.7 (K/uL)    Lymphocytes Relative 12  12 - 46 (%)    Lymphs Abs 1.4  0.7 - 4.0 (K/uL)    Monocytes Relative 7  3 - 12 (%)    Monocytes Absolute 0.7  0.1 - 1.0 (K/uL)    Eosinophils Relative 0  0 - 5 (%)    Eosinophils Absolute 0.0  0.0 - 0.7 (K/uL)    Basophils Relative 0  0 - 1 (%)    Basophils Absolute 0.0  0.0 - 0.1 (K/uL)   GLUCOSE, CAPILLARY     Status: Normal   Collection Time   06/06/11  8:29 AM      Component Value Range Comment   Glucose-Capillary 77  70 - 99 (mg/dL)   GLUCOSE, CAPILLARY     Status: Abnormal   Collection Time   06/06/11 12:31 PM      Component Value Range Comment   Glucose-Capillary 102 (*) 70 - 99 (mg/dL)   GLUCOSE, CAPILLARY     Status: Abnormal   Collection Time   06/06/11  3:48 PM       Component Value Range Comment   Glucose-Capillary 123 (*) 70 - 99 (mg/dL)   GLUCOSE, CAPILLARY     Status: Abnormal   Collection Time   06/06/11  7:49 PM      Component Value Range Comment   Glucose-Capillary 100 (*) 70 - 99 (mg/dL)   GLUCOSE, CAPILLARY     Status: Abnormal   Collection Time   06/06/11 11:54 PM      Component Value Range Comment   Glucose-Capillary 119 (*) 70 - 99 (mg/dL)   GLUCOSE, CAPILLARY     Status: Abnormal   Collection Time   06/07/11  3:44 AM      Component Value Range Comment   Glucose-Capillary 144 (*) 70 - 99 (mg/dL)    Comment 1 Notify RN      Comment 2 Documented in Chart     CBC     Status: Abnormal   Collection Time   06/07/11  6:00 AM      Component Value Range Comment   WBC 14.1 (*) 4.0 - 10.5 (K/uL)    RBC 4.17  3.87 - 5.11 (MIL/uL)    Hemoglobin 12.6  12.0 - 15.0 (g/dL)    HCT 16.1  09.6 - 04.5 (%)    MCV 90.6  78.0 - 100.0 (fL)    MCH 30.2  26.0 - 34.0 (pg)    MCHC 33.3  30.0 - 36.0 (g/dL)    RDW 40.9  81.1 - 91.4 (%)    Platelets 270  150 - 400 (K/uL)   COMPREHENSIVE METABOLIC PANEL  Status: Abnormal   Collection Time   06/07/11  6:00 AM      Component Value Range Comment   Sodium 144  135 - 145 (mEq/L)    Potassium 3.3 (*) 3.5 - 5.1 (mEq/L)    Chloride 102  96 - 112 (mEq/L)    CO2 29  19 - 32 (mEq/L)    Glucose, Bld 139 (*) 70 - 99 (mg/dL)    BUN 34 (*) 6 - 23 (mg/dL)    Creatinine, Ser 4.09 (*) 0.50 - 1.10 (mg/dL)    Calcium 8.9  8.4 - 10.5 (mg/dL)    Total Protein 6.3  6.0 - 8.3 (g/dL)    Albumin 2.5 (*) 3.5 - 5.2 (g/dL)    AST 20  0 - 37 (U/L)    ALT 26  0 - 35 (U/L)    Alkaline Phosphatase 204 (*) 39 - 117 (U/L)    Total Bilirubin 1.1  0.3 - 1.2 (mg/dL)    GFR calc non Af Amer 37 (*) >90 (mL/min)    GFR calc Af Amer 43 (*) >90 (mL/min)   PHOSPHORUS     Status: Normal   Collection Time   06/07/11  6:00 AM      Component Value Range Comment   Phosphorus 3.8  2.3 - 4.6 (mg/dL)   MAGNESIUM     Status: Abnormal   Collection Time     06/07/11  6:00 AM      Component Value Range Comment   Magnesium 1.3 (*) 1.5 - 2.5 (mg/dL)   GLUCOSE, CAPILLARY     Status: Abnormal   Collection Time   06/07/11  7:25 AM      Component Value Range Comment   Glucose-Capillary 188 (*) 70 - 99 (mg/dL)    Comment 1 Notify RN     GLUCOSE, CAPILLARY     Status: Abnormal   Collection Time   06/07/11 11:39 AM      Component Value Range Comment   Glucose-Capillary 181 (*) 70 - 99 (mg/dL)    Comment 1 Notify RN       Dg Elbow 2 Views Right  06/07/2011  *RADIOLOGY REPORT*  Clinical Data:  fracture, pain  RIGHT ELBOW - 2 VIEW  Comparison: None.  Findings: There is a minimally impacted radial head fracture. There is also an osseous fragment adjacent to the trochlea and coronoid process which may represent a small chip fracture.  Hypertrophic spurring is present at both epicondyles .  IMPRESSION: Minimally impacted radial head fracture.  Question chip fracture near the trochlea/trochlear notch of the proximal ulna.  Original Report Authenticated By: Brandon Melnick, M.D.   Dg Chest Port 1 View  06/07/2011  *RADIOLOGY REPORT*  Clinical Data: Evaluate endotracheal tube position  PORTABLE CHEST - 1 VIEW  Comparison: Portable chest x-ray of 06/06/2011  Findings: Again a right-sided aortic arch is noted.  There is little change to very slight improvement in the diffuse airspace disease most consistent with edema and effusions.  Mild cardiomegaly is stable.  Right IJ central venous line remains.  IMPRESSION:  1.  Little change to slight improvement in airspace disease most consistent with edema. 2.  No change in position of right IJ central venous line.  Original Report Authenticated By: Juline Patch, M.D.   Dg Chest Port 1 View  06/06/2011  *RADIOLOGY REPORT*  Clinical Data: Short of breath.  PORTABLE CHEST - 1 VIEW  Comparison: 06/05/2011  Findings: Dextrocardia and right-sided aortic arch again noted.  Despite lighter radiographic technique, there is been mild  worsening of bilateral pulmonary air space disease, suspicious for increased pulmonary edema.  Bibasilar atelectasis and probable small bilateral pleural effusions are also noted.  Cardiomegaly stable.  Right jugular center venous catheter remains in appropriate position.  Nasogastric tube is no longer visualized.  IMPRESSION:  1.  Increased in bilateral airspace opacity, likely due to pulmonary edema. 2.  Stable cardiomegaly. 3.  Bibasilar atelectasis and probable small bilateral pleural effusions.  Original Report Authenticated By: Danae Orleans, M.D.    @ROS @ Blood pressure 119/73, pulse 107, temperature 97.7 F (36.5 C), temperature source Oral, resp. rate 13, height 5\' 6"  (1.676 m), weight 105.4 kg (232 lb 5.8 oz), SpO2 94.00%. Exam: skin intact. Mild bruising elbow. Compartments soft. NVI. Tender radial head. Active flexion intact.  Assessment/Plan: Right elbow impacted radial head fx. 2wks s/p. Plan nonop. Early ROM. PT consult for ROM out of splint QD. $ more wks to heal. Can F/U with home Ortho as OPT.  Micharl Helmes C 06/07/2011, 3:53 PM

## 2011-06-07 NOTE — Progress Notes (Signed)
. Patient ID: [name], female   DOB: March 31, 1941, 71 y.o.   MRN: [id number] [facility] Progress Note:   3 Days Post-Op  Subjective: Alert and talkative but coughing with what appears to be a productive cough. Denies abd pain  Objective: Vital signs in last 24 hours: Temp:  [97.5 F (36.4 C)-99.7 F (37.6 C)] 97.6 F (36.4 C) (01/03 0800) Pulse Rate:  [95-122] 97  (01/03 0800) Resp:  [16-27] 16  (01/03 0800) BP: (93-133)/(63-88) 124/76 mmHg (01/03 0800) SpO2:  [90 %-97 %] 93 % (01/03 0913) Weight:  [103.2 kg (227 lb 8.2 oz)-105.4 kg (232 lb 5.8 oz)] 232 lb 5.8 oz (105.4 kg) (01/03 0341)  Intake/Output from previous day: 01/02 0701 - 01/03 0700 In: 1346 [P.O.:180; I.V.:340; IV Piggyback:826] Out: 8300 [Urine:8300] Intake/Output this shift: Total I/O In: 40 [I.V.:40] Out: 1150 [Urine:1150]  Physical Exam:  Abdomen not tender. Lab Results:   Basename 06/07/11 0600 06/06/11 0510  WBC 14.1* 11.4*  HGB 12.6 10.8*  HCT 37.8 33.5*  PLT 270 235   BMET  Basename 06/07/11 0600 06/06/11 0510  NA 144 143  K 3.3* 4.1  CL 102 113*  CO2 29 21  GLUCOSE 139* 109*  BUN 34* 37*  CREATININE 1.41* 1.34*  CALCIUM 8.9 8.7   PT/INR No results found for this basename: LABPROT:2,INR:2 in the last 72 hours Studies/Results: Dg Chest Port 1 View  06/07/2011  *RADIOLOGY REPORT*  Clinical Data: Evaluate endotracheal tube position  PORTABLE CHEST - 1 VIEW  Comparison: Portable chest x-ray of 06/06/2011  Findings: Again a right-sided aortic arch is noted.  There is little change to very slight improvement in the diffuse airspace disease most consistent with edema and effusions.  Mild cardiomegaly is stable.  Right IJ central venous line remains.  IMPRESSION:  1.  Little change to slight improvement in airspace disease most consistent with edema. 2.  No change in position of right IJ central venous line.  Original Report Authenticated By: Juline Patch, M.D.   Dg Chest Port 1  View  06/06/2011  *RADIOLOGY REPORT*  Clinical Data: Short of breath.  PORTABLE CHEST - 1 VIEW  Comparison: 06/05/2011  Findings: Dextrocardia and right-sided aortic arch again noted. Despite lighter radiographic technique, there is been mild worsening of bilateral pulmonary air space disease, suspicious for increased pulmonary edema.  Bibasilar atelectasis and probable small bilateral pleural effusions are also noted.  Cardiomegaly stable.  Right jugular center venous catheter remains in appropriate position.  Nasogastric tube is no longer visualized.  IMPRESSION:  1.  Increased in bilateral airspace opacity, likely due to pulmonary edema. 2.  Stable cardiomegaly. 3.  Bibasilar atelectasis and probable small bilateral pleural effusions.  Original Report Authenticated By: Danae Orleans, M.D.   Anti-infectives: Anti-infectives     Start     Dose/Rate Route Frequency Ordered Stop   06/05/11 1300   ciprofloxacin (CIPRO) IVPB 400 mg     Comments: pharamcy dose      400 mg 200 mL/hr over 60 Minutes Intravenous Every 12 hours 06/05/11 1130     06/05/11 1300   metroNIDAZOLE (FLAGYL) IVPB 500 mg        500 mg 100 mL/hr over 60 Minutes Intravenous 4 times per day 06/05/11 1130     06/04/11 1200   imipenem-cilastatin (PRIMAXIN) 250 mg in sodium chloride 0.9 % 100 mL IVPB  Status:  Discontinued        250 mg 200 mL/hr over 30 Minutes  Intravenous 4 times per day 06/04/11 1031 06/05/11 1130   06/02/11 2200   imipenem-cilastatin (PRIMAXIN) 500 mg in sodium chloride 0.9 % 100 mL IVPB  Status:  Discontinued        500 mg 200 mL/hr over 30 Minutes Intravenous 3 times per day 06/02/11 1226 06/04/11 1031   06/02/11 0600   metroNIDAZOLE (FLAGYL) IVPB 500 mg  Status:  Discontinued        500 mg 100 mL/hr over 60 Minutes Intravenous Every 8 hours 06/02/11 0504 06/02/11 0511   06/02/11 0600   imipenem-cilastatin (PRIMAXIN) 500 mg in sodium chloride 0.9 % 100 mL IVPB  Status:  Discontinued        500 mg 200  mL/hr over 30 Minutes Intravenous 4 times per day 06/02/11 0513 06/02/11 1226   06/02/11 0445   metroNIDAZOLE (FLAGYL) IVPB 500 mg  Status:  Discontinued        500 mg 100 mL/hr over 60 Minutes Intravenous  Once 06/02/11 0431 06/02/11 0432   06/02/11 0430   ertapenem (INVANZ) 1 g in sodium chloride 0.9 % 50 mL IVPB  Status:  Discontinued        1 g 100 mL/hr over 30 Minutes Intravenous Daily 06/02/11 0400 06/02/11 0512   06/01/11 2230   ciprofloxacin (CIPRO) IVPB 400 mg        400 mg 200 mL/hr over 60 Minutes Intravenous  Once 06/01/11 2215 06/02/11 0015   06/01/11 2230   metroNIDAZOLE (FLAGYL) IVPB 500 mg  Status:  Discontinued        500 mg 100 mL/hr over 60 Minutes Intravenous Every 8 hours 06/01/11 2215 06/02/11 0402          Assessment/Plan: Problem List: There is no problem list on file for this patient.   Trending better but not ready for surgery.  Needs to move further out of this acute phase and then will do cholecystecomy . Stress Myoview tomorrow. 3 Days Post-Op    LOS: 6 days   Matt B. Daphine Deutscher, MD, [facility] [phone] beeper [phone]  06/07/2011 10:51 AM

## 2011-06-07 NOTE — Progress Notes (Signed)
Patient Name: [name] Date of Encounter: 06/07/2011, 3:41 PM    Subjective  No CP, SOB. Feels weak. Occasional nausea.   Objective   Telemetry: sinus tach occasional PVC Physical Exam: Filed Vitals:   06/07/11 1200  BP: 119/73  Pulse: 107  Temp: 97.7 F (36.5 C)  Resp: 13   General: Well developed, well nourished, in no acute distress. Head: Normocephalic, atraumatic, sclera non-icteric, no xanthomas, nares are without discharge.  Neck: Negative for carotid bruits. JVD not elevated. Lungs: Clear bilaterally to auscultation without wheezes, rales, or rhonchi. Breathing is unlabored. Heart: RRR S1 S2 - prominent on R side of chest - without murmurs, rubs, or gallops.  Abdomen: Soft, non-tender, non-distended with normoactive bowel sounds. No hepatomegaly. No rebound/guarding. No obvious abdominal masses. Msk:  Strength and tone appear normal for age. Extremities: No clubbing or cyanosis. 1+ soft sockline edema.  Distal pedal pulses are 2+ and equal bilaterally. Neuro: Alert and oriented X 3. Moves all extremities spontaneously. Psych:  Responds to questions appropriately with a normal affect.    Intake/Output Summary (Last 24 hours) at 06/07/11 1541 Last data filed at 06/07/11 1400  Gross per 24 hour  Intake   1086 ml  Output   9625 ml  Net  -8539 ml    Labs:  Basename 06/07/11 0600 06/06/11 0510  NA 144 143  K 3.3* 4.1  CL 102 113*  CO2 29 21  GLUCOSE 139* 109*  BUN 34* 37*  CREATININE 1.41* 1.34*  CALCIUM 8.9 8.7  MG 1.3* --  PHOS 3.8 --    Basename 06/07/11 0600 06/06/11 0510  AST 20 16  ALT 26 32  ALKPHOS 204* 192*  BILITOT 1.1 0.8  PROT 6.3 5.5*  ALBUMIN 2.5* 2.1*   No results found for this basename: LIPASE:2,AMYLASE:2 in the last 72 hours  Basename 06/07/11 0600 06/06/11 0510 06/05/11 0500  WBC 14.1* 11.4* --  NEUTROABS -- 9.3* 12.9*  HGB 12.6 10.8* --  HCT 37.8 33.5* --  MCV 90.6 91.8 --  PLT 270 235 --    Radiology/Studies:  1.  06/07/2011  *RADIOLOGY REPORT*  Clinical Data: Evaluate endotracheal tube position  PORTABLE CHEST - 1 VIEW  Comparison: Portable chest x-ray of 06/06/2011  Findings: Again a right-sided aortic arch is noted.  There is little change to very slight improvement in the diffuse airspace disease most consistent with edema and effusions.  Mild cardiomegaly is stable.  Right IJ central venous line remains.  IMPRESSION:  1.  Little change to slight improvement in airspace disease most consistent with edema. 2.  No change in position of right IJ central venous line.  Original Report Authenticated By: Juline Patch, M.D.     Assessment and Plan   71 y/o F with hx dextrocardia, HTN, HL admitted with septic shock and choledocholithiasis/cholangitis s/p ERCP, sphincterotomy, CBD stone extraction, now cooling off in prep for possible cholecystectomy. We were consulted for + troponins and LV dysfunction on echo.  1. Elevated cardiac enzymes - on 12/30 troponin peaked at 1.04. No known hx CAD but does have dextrocardia. Agree with Myoview. (The only limiting factor will be her elbow splint in obtaining pictures. Ortho says limited gentle ROM is okay with fractured elbow. I have spoken with nuclear and they said to go ahead and order test and they will evaluate for suitability once she is down there). No CP/SOB. -  Add low-dose ASA when okay with surgery.   2. LV dysfunction with  acute systolic CHF - myoview tomorrow to differentiate if any reversible ischemia. She remains on captopril & pressure seems to be tolerating it. Consider adding BB as pressure allows. Had massive diuresis yesterday, weight still appears to be up from admit but down from peak. I suspect some of her volume overload is related to hypoalbuminemia as well. Po intake is modest. Continue current lasix through today and follow Cr, reassess for de-escalation tomorrow.  3. Cholangitis/sepsis - per primary team. Continuing on antibiotics. Pressure improved.  No longer on pressors.  4. Lytes - both K and Mg are low, do not see that these have been repleted. We will replace for today and check in AM. (Note CrCl is 73 so will use standard dosing.)  Signed, Dayna Dunn PA-C  I have seen, examined the patient, and reviewed the above assessment and plan.  Briefly, pt with + troponin in setting of acute illness.  Presently weak but improved.   I would agree with stress testing.  Will plan to perform tomorrow if the patient is stable at that time.  Co Sign: Hillis Range, MD 06/07/2011 4:48 PM

## 2011-06-08 ENCOUNTER — Inpatient Hospital Stay (HOSPITAL_COMMUNITY): Payer: Medicare Other

## 2011-06-08 DIAGNOSIS — I214 Non-ST elevation (NSTEMI) myocardial infarction: Secondary | ICD-10-CM

## 2011-06-08 DIAGNOSIS — N179 Acute kidney failure, unspecified: Secondary | ICD-10-CM

## 2011-06-08 DIAGNOSIS — R079 Chest pain, unspecified: Secondary | ICD-10-CM

## 2011-06-08 DIAGNOSIS — I5021 Acute systolic (congestive) heart failure: Secondary | ICD-10-CM

## 2011-06-08 DIAGNOSIS — I509 Heart failure, unspecified: Secondary | ICD-10-CM

## 2011-06-08 DIAGNOSIS — I5022 Chronic systolic (congestive) heart failure: Secondary | ICD-10-CM

## 2011-06-08 LAB — CBC
MCH: 28.7 pg (ref 26.0–34.0)
MCHC: 31.4 g/dL (ref 30.0–36.0)
MCV: 91.4 fL (ref 78.0–100.0)
Platelets: 251 10*3/uL (ref 150–400)
RDW: 15.4 % (ref 11.5–15.5)

## 2011-06-08 LAB — CULTURE, BLOOD (ROUTINE X 2)
Culture  Setup Time: 201212291044
Culture: NO GROWTH

## 2011-06-08 LAB — COMPREHENSIVE METABOLIC PANEL
AST: 28 U/L (ref 0–37)
Albumin: 2.8 g/dL — ABNORMAL LOW (ref 3.5–5.2)
CO2: 33 mEq/L — ABNORMAL HIGH (ref 19–32)
Calcium: 9 mg/dL (ref 8.4–10.5)
Creatinine, Ser: 1.58 mg/dL — ABNORMAL HIGH (ref 0.50–1.10)
GFR calc non Af Amer: 32 mL/min — ABNORMAL LOW (ref >90)

## 2011-06-08 LAB — GLUCOSE, CAPILLARY
Glucose-Capillary: 135 mg/dL — ABNORMAL HIGH (ref 70–99)
Glucose-Capillary: 210 mg/dL — ABNORMAL HIGH (ref 70–99)
Glucose-Capillary: 180 mg/dL — ABNORMAL HIGH (ref 70–99)
Glucose-Capillary: 154 mg/dL — ABNORMAL HIGH (ref 70–99)
Glucose-Capillary: 140 mg/dL — ABNORMAL HIGH (ref 70–99)
Glucose-Capillary: 111 mg/dL — ABNORMAL HIGH (ref 70–99)

## 2011-06-08 MED ORDER — TECHNETIUM TC 99M TETROFOSMIN IV KIT
30.0000 | PACK | Freq: Once | INTRAVENOUS | Status: AC | PRN
  Administered 2011-06-08: 30 via INTRAVENOUS

## 2011-06-08 MED ORDER — SODIUM CHLORIDE 0.9 % IJ SOLN
INTRAMUSCULAR | Status: AC
  Administered 2011-06-08: 20 mL
  Filled 2011-06-08: qty 20

## 2011-06-08 MED ORDER — POTASSIUM CHLORIDE CRYS ER 20 MEQ PO TBCR
40.0000 meq | EXTENDED_RELEASE_TABLET | Freq: Once | ORAL | Status: AC
  Administered 2011-06-08: 40 meq via ORAL
  Filled 2011-06-08: qty 2

## 2011-06-08 MED ORDER — FUROSEMIDE 40 MG PO TABS
40.0000 mg | ORAL_TABLET | Freq: Two times a day (BID) | ORAL | Status: DC
  Administered 2011-06-08 – 2011-06-10 (×4): 40 mg via ORAL
  Filled 2011-06-08 (×6): qty 1

## 2011-06-08 MED ORDER — TECHNETIUM TC 99M TETROFOSMIN IV KIT
10.0000 | PACK | Freq: Once | INTRAVENOUS | Status: AC | PRN
  Administered 2011-06-08: 10 via INTRAVENOUS

## 2011-06-08 MED ORDER — MAGNESIUM OXIDE 400 MG PO TABS
400.0000 mg | ORAL_TABLET | Freq: Every day | ORAL | Status: AC
  Administered 2011-06-08 – 2011-06-10 (×3): 400 mg via ORAL
  Filled 2011-06-08 (×3): qty 1

## 2011-06-08 NOTE — Progress Notes (Signed)
06/08/11 11:24 PT Note: Pt currently out of room at stress test.  Will follow-up as able.  Thanks.  06/08/2011 Cephus Shelling, PT, DPT [phone]

## 2011-06-08 NOTE — Progress Notes (Signed)
BP 107/71  Pulse 100  Temp(Src) 97.9 F (36.6 C) (Oral)  Resp 20  Ht 5\' 6"  (1.676 m)  Wt 100.8 kg (222 lb 3.6 oz)  BMI 35.87 kg/m2  SpO2 96%  Off unit for Stress Myoview. Will follow along as she is tuned up for eventual cholecystectomy.  Stress test wore her out.  She is significantly deconditioned and will need to regain strength before undergoing cholecystectomy

## 2011-06-08 NOTE — Progress Notes (Signed)
Inpatient Diabetes Program Recommendations  AACE/ADA: New Consensus Statement on Inpatient Glycemic Control (2009)  Target Ranges:  Prepandial:   less than 140 mg/dL      Peak postprandial:   less than 180 mg/dL (1-2 hours)      Critically ill patients:  140 - 180 mg/dL   Reason for Visit: CBG's greater than goal.  No history of diabetes noted.  A1c would be helpful to determine extent of hyperglycemia prior to admit.  Inpatient Diabetes Program Recommendations HgbA1C: To determine pre-hospitilization glycemic control  Note: Will follow.

## 2011-06-08 NOTE — Progress Notes (Signed)
Patient Name: [name] Date of Encounter: 06/08/2011, 10:42 AM    Subjective   S:  Doing well,  Reports mild chest pressure during stress test none since Presently comfortable    Objective   Telemetry: sinus Physical Exam: Filed Vitals:   06/08/11 1015  BP: 107/71  Pulse: 100  Temp:   Resp: 20      General: Well developed, well nourished, in no acute distress.  Head: Normocephalic, atraumatic, sclera non-icteric, no xanthomas, nares are without discharge.  Neck: Negative for carotid bruits. JVD not elevated.  Lungs: Clear bilaterally to auscultation without wheezes, rales, or rhonchi. Breathing is unlabored.  Heart: Slightly tachycardic S1 S2 - prominent on R side of chest - without murmurs, rubs, or gallops.  Abdomen: Soft, non-tender, non-distended with normoactive bowel sounds. No hepatomegaly. No rebound/guarding. No obvious abdominal masses.  Msk: Strength and tone appear normal for age.  Extremities: No clubbing or cyanosis. Tr soft sockline edema. Distal pedal pulses are 2+ and equal bilaterally.  Neuro: Alert and oriented X 3. Moves all extremities spontaneously.  Psych: Responds to questions appropriately with a normal affect.      Intake/Output Summary (Last 24 hours) at 06/08/11 1042 Last data filed at 06/08/11 0800  Gross per 24 hour  Intake    840 ml  Output   5926 ml  Net  -5086 ml    Labs:  Basename 06/08/11 0640 06/07/11 0600  NA 140 144  K 3.6 3.3*  CL 93* 102  CO2 33* 29  GLUCOSE 149* 139*  BUN 36* 34*  CREATININE 1.58* 1.41*  CALCIUM 9.0 8.9  MG 1.8 1.3*  PHOS -- 3.8    Basename 06/08/11 0640 06/07/11 0600  AST 28 20  ALT 26 26  ALKPHOS 209* 204*  BILITOT 1.0 1.1  PROT 7.0 6.3  ALBUMIN 2.8* 2.5*   No results found for this basename: LIPASE:2,AMYLASE:2 in the last 72 hours  Basename 06/08/11 0640 06/07/11 0600 06/06/11 0510  WBC 13.0* 14.1* --  NEUTROABS -- -- 9.3*  HGB 13.4 12.6 --  HCT 42.7 37.8 --  MCV 91.4 90.6 --   PLT 251 270 --    Radiology/Studies:  1. 06/07/2011 *RADIOLOGY REPORT* Clinical Data: Evaluate endotracheal tube position PORTABLE CHEST - 1 VIEW Comparison: Portable chest x-ray of 06/06/2011 Findings: Again a right-sided aortic arch is noted. There is little change to very slight improvement in the diffuse airspace disease most consistent with edema and effusions. Mild cardiomegaly is stable. Right IJ central venous line remains. IMPRESSION: 1. Little change to slight improvement in airspace disease most consistent with edema. 2. No change in position of right IJ central venous line. Original Report Authenticated By: Juline Patch, M.D.     Assessment and Plan   71 y/o F with hx dextrocardia, HTN, HL admitted with septic shock and choledocholithiasis/cholangitis s/p ERCP, sphincterotomy, CBD stone extraction, now cooling off in prep for possible cholecystectomy. We were consulted for + troponins and LV dysfunction on echo.   1. NSTEMI - on 12/30 troponin peaked at 1.04. No known hx CAD but does have dextrocardia.  High risk myoview today- will need cath prior to surgery. Will anticipate cath on Monday if renal function is stable  2. LV dysfunction with acute systolic CHF - She remains on captopril & pressure seems to be tolerating it. Consider adding BB as pressure allows. Has diuresed well but BUN/Cr starting to rise. I suspect some of her volume overload is related to  hypoalbuminemia as well. Po intake is modest. Decrease Lasix to 40mg  po bid today and follow. She is not sure what her baseline weight was, but recorded weight on admit day 1 vary from 200-224. Down to 222 today.  If creatinine rises further, will hold lasix  3. Cholangitis/sepsis - per primary team. Continuing on antibiotics. Pressure improved. No longer on pressors.   4. Lytes - improved today. Will give MagOx 400mg  po qday x 3 days & KCl po today. May need maintenance K but will hold off on scheduled in light of  Cr.  5. Acute renal insufficiency - BUN/Cr slightly rising today likely because of diuresis. Will back down to Lasix 40mg  po bid for now.  If further elevation in creatinine, will hold lasix in anticipation of cath on Monday.   Hillis Range, MD 06/08/2011 5:32 PM

## 2011-06-09 DIAGNOSIS — E876 Hypokalemia: Secondary | ICD-10-CM

## 2011-06-09 DIAGNOSIS — Q240 Dextrocardia: Secondary | ICD-10-CM

## 2011-06-09 LAB — CBC
HCT: 40.1 % (ref 36.0–46.0)
Hemoglobin: 12.8 g/dL (ref 12.0–15.0)
MCV: 93.3 fL (ref 78.0–100.0)
RDW: 15.6 % — ABNORMAL HIGH (ref 11.5–15.5)
WBC: 12.9 10*3/uL — ABNORMAL HIGH (ref 4.0–10.5)

## 2011-06-09 LAB — GLUCOSE, CAPILLARY
Glucose-Capillary: 120 mg/dL — ABNORMAL HIGH (ref 70–99)
Glucose-Capillary: 201 mg/dL — ABNORMAL HIGH (ref 70–99)
Glucose-Capillary: 204 mg/dL — ABNORMAL HIGH (ref 70–99)

## 2011-06-09 LAB — COMPREHENSIVE METABOLIC PANEL
Alkaline Phosphatase: 159 U/L — ABNORMAL HIGH (ref 39–117)
BUN: 37 mg/dL — ABNORMAL HIGH (ref 6–23)
Chloride: 92 mEq/L — ABNORMAL LOW (ref 96–112)
Creatinine, Ser: 1.58 mg/dL — ABNORMAL HIGH (ref 0.50–1.10)
GFR calc Af Amer: 37 mL/min — ABNORMAL LOW (ref >90)
GFR calc non Af Amer: 32 mL/min — ABNORMAL LOW (ref >90)
Glucose, Bld: 136 mg/dL — ABNORMAL HIGH (ref 70–99)
Potassium: 3.2 mEq/L — ABNORMAL LOW (ref 3.5–5.1)
Total Bilirubin: 1 mg/dL (ref 0.3–1.2)

## 2011-06-09 MED ORDER — PREDNISONE 10 MG PO TABS: 10.0000 mg | ORAL_TABLET | Freq: Every day | ORAL | Status: DC

## 2011-06-09 MED ORDER — PANTOPRAZOLE SODIUM 40 MG PO TBEC
40.0000 mg | DELAYED_RELEASE_TABLET | Freq: Every day | ORAL | Status: DC
  Administered 2011-06-09 – 2011-06-13 (×5): 40 mg via ORAL
  Filled 2011-06-09 (×5): qty 1

## 2011-06-09 MED ORDER — SODIUM CHLORIDE 0.9 % IJ SOLN
INTRAMUSCULAR | Status: AC
  Filled 2011-06-09: qty 10

## 2011-06-09 MED ORDER — PREDNISONE 10 MG PO TABS
10.0000 mg | ORAL_TABLET | Freq: Every day | ORAL | Status: DC
  Administered 2011-06-09 – 2011-06-12 (×4): 10 mg via ORAL
  Filled 2011-06-09 (×6): qty 1

## 2011-06-09 MED ORDER — POTASSIUM CHLORIDE CRYS ER 20 MEQ PO TBCR
40.0000 meq | EXTENDED_RELEASE_TABLET | Freq: Once | ORAL | Status: AC
  Administered 2011-06-09: 40 meq via ORAL
  Filled 2011-06-09: qty 2

## 2011-06-09 NOTE — Progress Notes (Signed)
Patient ID: [name], female   DOB: [birth year], 71 y.o.   MRN: [id number] SUBJECTIVE: The patient is weak but stable today. She's not having any chest pain.   Filed Vitals:   06/08/11 2300 06/09/11 0214 06/09/11 0300 06/09/11 0740  BP: 101/60  94/64 105/62  Pulse: 100  100 91  Temp: 97.7 F (36.5 C)  97.8 F (36.6 C) 98 F (36.7 C)  TempSrc: Oral  Oral Oral  Resp: 15  18 16   Height:      Weight:   223 lb 12.3 oz (101.5 kg)   SpO2: 94% 95% 96% 96%    Intake/Output Summary (Last 24 hours) at 06/09/11 0805 Last data filed at 06/09/11 0740  Gross per 24 hour  Intake    860 ml  Output   2350 ml  Net  -1490 ml    LABS: Basic Metabolic Panel:  Basename 06/09/11 0400 06/08/11 0640 06/07/11 0600  NA 136 140 --  K 3.2* 3.6 --  CL 92* 93* --  CO2 35* 33* --  GLUCOSE 136* 149* --  BUN 37* 36* --  CREATININE 1.58* 1.58* --  CALCIUM 8.4 9.0 --  MG -- 1.8 1.3*  PHOS -- -- 3.8   Liver Function Tests:  Basename 06/09/11 0400 06/08/11 0640  AST 23 28  ALT 22 26  ALKPHOS 159* 209*  BILITOT 1.0 1.0  PROT 5.9* 7.0  ALBUMIN 2.5* 2.8*   No results found for this basename: LIPASE:2,AMYLASE:2 in the last 72 hours CBC:  Basename 06/09/11 0400 06/08/11 0640  WBC 12.9* 13.0*  NEUTROABS -- --  HGB 12.8 13.4  HCT 40.1 42.7  MCV 93.3 91.4  PLT 265 251   Cardiac Enzymes: No results found for this basename: CKTOTAL:3,CKMB:3,CKMBINDEX:3,TROPONINI:3 in the last 72 hours BNP: No components found with this basename: POCBNP:3 D-Dimer: No results found for this basename: DDIMER:2 in the last 72 hours Hemoglobin A1C: No results found for this basename: HGBA1C in the last 72 hours Fasting Lipid Panel: No results found for this basename: CHOL,HDL,LDLCALC,TRIG,CHOLHDL,LDLDIRECT in the last 72 hours Thyroid Function Tests: No results found for this basename: TSH,T4TOTAL,FREET3,T3FREE,THYROIDAB in the last 72 hours  RADIOLOGY:  Dg Elbow 2 Views Right  06/07/2011  *RADIOLOGY  REPORT*  Clinical Data:  fracture, pain  RIGHT ELBOW - 2 VIEW  Comparison: None.  Findings: There is a minimally impacted radial head fracture. There is also an osseous fragment adjacent to the trochlea and coronoid process which may represent a small chip fracture.  Hypertrophic spurring is present at both epicondyles .   Dg Chest Port 1 View  06/07/2011  *RADIOLOGY REPORT*  Clinical Data: Evaluate endotracheal tube position  PORTABLE CHEST - 1 VIEW  Comparison: Portable chest x-ray of 06/06/2011  Findings: Again a right-sided aortic arch is noted.  There is little change to very slight improvement in the diffuse airspace disease most consistent with edema and effusions.  Mild cardiomegaly is stable.  Right IJ central venous line remains.  IMPRESSION:  1.  Little change to slight improvement in airspace disease most consistent with edema. 2.  No change in position of right IJ central venous line.  Original Report Authenticated By: Juline Patch, M.D.     PHYSICAL EXAM Patient is oriented to person time and place. She is fatigued but her affect is normal. Lungs reveal scattered rhonchi. Cardiac exam reveals an S1 and S2. There no significant murmurs. There is trace peripheral edema.   TELEMETRY: Telemetry reviewed by me.  There is normal sinus rhythm.   ASSESSMENT AND PLAN:  Active Problems:  Troponin level elevated   Acute myocardial infarction, subendocardial infarction, subsequent episode of care    The patient's nuclear scan was abnormal. There is plan for cardiac catheterization. I will see her tomorrow to decide if she is stable for catheterization on Monday.   Acute systolic congestive heart failure   Chest x-ray on January 3 was still wet. She still has trace peripheral edema. She is still diuresis. I will leave her diuretics the same.   Acute renal failure  BUN and creatinine today are the same at 37 and 1.58. Plan to continue to follow this.   Hypokalemia   Potassium is 3.2 despite  receiving potassium yesterday. I will increase the dose.   Hypomagnesemia   Patient is receiving magnesium.   Dextrocardia    Willa Rough 06/09/2011 8:05 AM

## 2011-06-09 NOTE — Progress Notes (Signed)
Subjective: Chart reviewed, 71 y/o f admitted with abdominal pain, SIRS, found to have choledocholethiasis/ cholangitis.s/p ERCP, sphincterotomy, CBD stone extraction.Now plan for cholecystectomy during this admission, once she is medically stable.  Myoview is abnormal, Plan is for cardiac cath on Monday.  Objective: Vital signs in last 24 hours: Temp:  [97.4 F (36.3 C)-98 F (36.7 C)] 98 F (36.7 C) (01/05 0740) Pulse Rate:  [91-114] 91  (01/05 0740) Resp:  [15-23] 16  (01/05 0740) BP: (94-127)/(57-71) 105/62 mmHg (01/05 0740) SpO2:  [94 %-96 %] 96 % (01/05 0740) Weight:  [101.5 kg (223 lb 12.3 oz)] 223 lb 12.3 oz (101.5 kg) (01/05 0300) Weight change: 0.7 kg (1 lb 8.7 oz) Last BM Date: 06/07/11  Intake/Output from previous day: 01/04 0701 - 01/05 0700 In: 860 [P.O.:460; I.V.:100; IV Piggyback:300] Out: 3000 [Urine:3000] Total I/O In: -  Out: 550 [Urine:550]   Physical Exam: General: Alert, awake, oriented x3, in no acute distress. HEENT: No bruits, no goiter. Heart: Regular rate and rhythm, without murmurs, rubs, gallops. Lungs: Clear to auscultation bilaterally. Abdomen: Soft, nontender, nondistended, positive bowel sounds. Extremities: No clubbing cyanosis or edema with positive pedal pulses. Neuro: Grossly intact, nonfocal.    Lab Results: Basic Metabolic Panel:  Basename 06/09/11 0400 06/08/11 0640 06/07/11 0600  NA 136 140 --  K 3.2* 3.6 --  CL 92* 93* --  CO2 35* 33* --  GLUCOSE 136* 149* --  BUN 37* 36* --  CREATININE 1.58* 1.58* --  CALCIUM 8.4 9.0 --  MG -- 1.8 1.3*  PHOS -- -- 3.8   Liver Function Tests:  Basename 06/09/11 0400 06/08/11 0640  AST 23 28  ALT 22 26  ALKPHOS 159* 209*  BILITOT 1.0 1.0  PROT 5.9* 7.0  ALBUMIN 2.5* 2.8*   No results found for this basename: LIPASE:2,AMYLASE:2 in the last 72 hours No results found for this basename: AMMONIA:2 in the last 72 hours CBC:  Basename 06/09/11 0400 06/08/11 0640  WBC 12.9* 13.0*    NEUTROABS -- --  HGB 12.8 13.4  HCT 40.1 42.7  MCV 93.3 91.4  PLT 265 251   CBG:  Basename 06/09/11 0810 06/09/11 0319 06/08/11 2322 06/08/11 2057 06/08/11 1557 06/08/11 1252  GLUCAP 120* 120* 111* 140* 181* 181*    Recent Results (from the past 240 hour(s))  CULTURE, BLOOD (ROUTINE X 2)     Status: Normal   Collection Time   06/02/11  5:00 AM      Component Value Range Status Comment   Specimen Description BLOOD RIGHT ARM   Final    Special Requests BOTTLES DRAWN AEROBIC AND ANAEROBIC 10CC   Final    Setup Time [id number]   Final    Culture NO GROWTH 5 DAYS   Final    Report Status 06/08/2011 FINAL   Final   URINE CULTURE     Status: Normal   Collection Time   06/02/11  6:14 AM      Component Value Range Status Comment   Specimen Description URINE, CATHETERIZED   Final    Special Requests NONE   Final    Setup Time [id number]   Final    Colony Count NO GROWTH   Final    Culture NO GROWTH   Final    Report Status 06/03/2011 FINAL   Final   MRSA PCR SCREENING     Status: Normal   Collection Time   06/02/11  8:40 AM      Component Value Range  Status Comment   MRSA by PCR NEGATIVE  NEGATIVE  Final   CULTURE, SPUTUM-ASSESSMENT     Status: Normal   Collection Time   06/02/11 11:05 PM      Component Value Range Status Comment   Specimen Description SPUTUM   Final    Special Requests NONE   Final    Sputum evaluation     Final    Value: MICROSCOPIC FINDINGS SUGGEST THAT THIS SPECIMEN IS NOT REPRESENTATIVE OF LOWER RESPIRATORY SECRETIONS. PLEASE RECOLLECT.     CALLED TO Lianne Bushy RN AT [phone] ON [id number]   Report Status 06/02/2011 FINAL   Final     Studies/Results: Dg Elbow 2 Views Right  06/07/2011  *RADIOLOGY REPORT*  Clinical Data:  fracture, pain  RIGHT ELBOW - 2 VIEW  Comparison: None.  Findings: There is a minimally impacted radial head fracture. There is also an osseous fragment adjacent to the trochlea and coronoid process which may represent a small chip fracture.   Hypertrophic spurring is present at both epicondyles .  IMPRESSION: Minimally impacted radial head fracture.  Question chip fracture near the trochlea/trochlear notch of the proximal ulna.  Original Report Authenticated By: Brandon Melnick, M.D.   Nm Myocar Multi W/spect W/wall Motion / Ef  06/08/2011  *RADIOLOGY REPORT*  Clinical Data:  Chest pain with elevated cardiac enzymes and LV dysfunction.  Situs inversus.  MYOCARDIAL IMAGING WITH SPECT (REST AND PHARMACOLOGIC-STRESS) GATED LEFT VENTRICULAR WALL MOTION STUDY LEFT VENTRICULAR EJECTION FRACTION  Technique:  Resting myocardial SPECT imaging was initially performed after intravenous administration of radiopharmaceutical. Myocardial SPECT was subsequently performed after additional radiopharmaceutical injection during pharmacologic-stress (Lexiscan)supervised by the Cardiology staff.  Quantitative gated imaging was also performed to evaluate left ventricular wall motion, and estimate left ventricular ejection fraction.  Radiopharmaceutical:  10 mCi Tc-81m Myoview at rest and 30 mCi during stress.  Comparison: MRCP 06/02/2011.  Abdominal CT 06/01/2011.  Findings:   The source planar images demonstrate prominent subdiaphragmatic activity and situs inversus.  Post processing is affected by the patient's altered anatomy.  The computer generated labeling appears inconsistent.  SPECT images demonstrate decreased activity at the apex.  There appears to be a reversible component involving the distal anterolateral wall.  Gated cine images were reviewed on the workstation and demonstrate apical and septal hypokinesis.  The QGS ejection fraction measured at rest is 28% with an end diastolic volume of 43 ml and an end- systolic volume of 31 ml.  IMPRESSION:  1.  Abnormal study demonstrating dilatation of the left ventricle and left ventricular dysfunction.  Calculated left ventricular ejection fraction is 28%. 2.  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  There is a probable apical infarct with a possible area of reversibility involving the distal anterolateral wall concerning for ischemia.  Original Report Authenticated By: Gerrianne Scale, M.D.    Medications: Scheduled Meds:    . albuterol  2.5 mg Nebulization Q6H  . captopril  6.25 mg Oral BID  . ciprofloxacin  400 mg Intravenous Q12H  . furosemide  40 mg Oral BID  . heparin subcutaneous  5,000 Units Subcutaneous Q8H  . hydrocortisone sod succinate (SOLU-CORTEF) injection  25 mg Intravenous Daily  . insulin aspart  0-15 Units Subcutaneous Q4H  . insulin glargine  5 Units Subcutaneous QHS  . ipratropium  0.5 mg Nebulization Q6H  . magnesium oxide  400 mg Oral Daily  . metronidazole  500 mg Intravenous Q6H  . pantoprazole (PROTONIX) IV  40  mg Intravenous Q24H  . potassium chloride  40 mEq Oral Once  . potassium chloride  40 mEq Oral Once  . regadenoson  0.4 mg Intravenous Once  . sodium chloride  1,000 mL Intravenous Once  . sodium chloride      . sodium chloride      . DISCONTD: aspirin  300 mg Rectal Once  . DISCONTD: furosemide  80 mg Intravenous Q8H   Continuous Infusions:    . sodium chloride 20 mL/hr at 06/08/11 1800   PRN Meds:.albuterol, ondansetron, technetium tetrofosmin  Assessment/Plan:  SIRS/Sepstic shock: Resolved.   Cholangitis: Improving, on clear liquid diet. GI and surgery following. LFT's are normal. Cont Cipro and Flagyl. WBC mildly elevated.   CHF: Pt started on diuresis with lasix. Improvement in shortness of breath. Cardiology following Myoview abnormal. Card cath on Monday.  S/P VDRF: Stable  AKI: Sec to septic shock Resolving. Cont to monitor.  Anemia: H&H is stable.  Fractured right elbow: Immobilized for comfort F/u Ortho as outpatient.  Hypokalemia: Replace potassium.  Hyperglycemia: Continue SSI  DVT Prophylaxis: Heparin     LOS: 8 days   [facility] Pager: [phone] 06/09/2011,  8:59 AM

## 2011-06-09 NOTE — Progress Notes (Signed)
Patient ID: [name], female   DOB: [birth year], 71 y.o.   MRN: [id number] Will await completion of cardiac evaluation.  Wilmon Arms. Corliss Skains, MD, [facility]  06/09/2011 9:19 AM

## 2011-06-10 LAB — GLUCOSE, CAPILLARY
Glucose-Capillary: 111 mg/dL — ABNORMAL HIGH (ref 70–99)
Glucose-Capillary: 121 mg/dL — ABNORMAL HIGH (ref 70–99)
Glucose-Capillary: 192 mg/dL — ABNORMAL HIGH (ref 70–99)

## 2011-06-10 LAB — CBC
Hemoglobin: 12.6 g/dL (ref 12.0–15.0)
MCH: 30.4 pg (ref 26.0–34.0)
MCHC: 32.7 g/dL (ref 30.0–36.0)
Platelets: 248 10*3/uL (ref 150–400)
RDW: 15.3 % (ref 11.5–15.5)

## 2011-06-10 LAB — COMPREHENSIVE METABOLIC PANEL
ALT: 17 U/L (ref 0–35)
Albumin: 2.5 g/dL — ABNORMAL LOW (ref 3.5–5.2)
Calcium: 8.2 mg/dL — ABNORMAL LOW (ref 8.4–10.5)
GFR calc Af Amer: 41 mL/min — ABNORMAL LOW (ref >90)
Glucose, Bld: 108 mg/dL — ABNORMAL HIGH (ref 70–99)
Sodium: 136 mEq/L (ref 135–145)
Total Protein: 5.9 g/dL — ABNORMAL LOW (ref 6.0–8.3)

## 2011-06-10 MED ORDER — DIAZEPAM 5 MG PO TABS
5.0000 mg | ORAL_TABLET | ORAL | Status: AC
  Administered 2011-06-11: 5 mg via ORAL
  Filled 2011-06-10: qty 1

## 2011-06-10 MED ORDER — ASPIRIN 81 MG PO CHEW
324.0000 mg | CHEWABLE_TABLET | ORAL | Status: AC
  Administered 2011-06-11: 324 mg via ORAL
  Filled 2011-06-10: qty 4

## 2011-06-10 MED ORDER — SODIUM CHLORIDE 0.9 % IV SOLN
1.0000 mL/kg/h | INTRAVENOUS | Status: DC
  Administered 2011-06-11: 1 mL/kg/h via INTRAVENOUS

## 2011-06-10 MED ORDER — SODIUM CHLORIDE 0.9 % IJ SOLN
3.0000 mL | Freq: Two times a day (BID) | INTRAMUSCULAR | Status: DC
  Administered 2011-06-10 – 2011-06-11 (×4): 3 mL via INTRAVENOUS

## 2011-06-10 MED ORDER — SODIUM CHLORIDE 0.9 % IJ SOLN
INTRAMUSCULAR | Status: AC
  Filled 2011-06-10: qty 20

## 2011-06-10 MED ORDER — SODIUM CHLORIDE 0.9 % IJ SOLN
INTRAMUSCULAR | Status: AC
  Administered 2011-06-10: 03:00:00
  Filled 2011-06-10: qty 20

## 2011-06-10 MED ORDER — SODIUM CHLORIDE 0.9 % IV SOLN: 250.0000 mL | INTRAVENOUS | Status: DC | PRN

## 2011-06-10 MED ORDER — SODIUM CHLORIDE 0.9 % IJ SOLN: 3.0000 mL | INTRAMUSCULAR | Status: DC | PRN

## 2011-06-10 NOTE — Progress Notes (Signed)
Patient ID: [name], female   DOB: December 05, 1940, 71 y.o.   MRN: [id number] 7 Days Post-Op  Subjective: Pt feels ok.  No new complaints.  Objective: Vital signs in last 24 hours: Temp:  [97.1 F (36.2 C)-98.2 F (36.8 C)] 98 F (36.7 C) (01/06 0738) Pulse Rate:  [93-96] 96  (01/06 0738) Resp:  [16-26] 26  (01/06 0738) BP: (93-114)/(49-87) 109/62 mmHg (01/06 0738) SpO2:  [93 %-100 %] 97 % (01/06 0801) Weight:  [211 lb 3.2 oz (95.8 kg)] 211 lb 3.2 oz (95.8 kg) (01/06 0400) Last BM Date: 06/08/11  Intake/Output from previous day: 01/05 0701 - 01/06 0700 In: 1420 [P.O.:1400; I.V.:20] Out: 3100 [Urine:3100] Intake/Output this shift: Total I/O In: 480 [P.O.:480] Out: 500 [Urine:500]  PE: Abd: soft, NT, Nd, +BS  Lab Results:   Basename 06/10/11 0350 06/09/11 0400  WBC 13.4* 12.9*  HGB 12.6 12.8  HCT 38.5 40.1  PLT 248 265   BMET  Basename 06/10/11 0350 06/09/11 0400  NA 136 136  K 4.1 3.2*  CL 93* 92*  CO2 34* 35*  GLUCOSE 108* 136*  BUN 28* 37*  CREATININE 1.48* 1.58*  CALCIUM 8.2* 8.4   PT/INR No results found for this basename: LABPROT:2,INR:2 in the last 72 hours   Studies/Results: Nm Myocar Multi W/spect W/wall Motion / Ef  06/08/2011  *RADIOLOGY REPORT*  Clinical Data:  Chest pain with elevated cardiac enzymes and LV dysfunction.  Situs inversus.  MYOCARDIAL IMAGING WITH SPECT (REST AND PHARMACOLOGIC-STRESS) GATED LEFT VENTRICULAR WALL MOTION STUDY LEFT VENTRICULAR EJECTION FRACTION  Technique:  Resting myocardial SPECT imaging was initially performed after intravenous administration of radiopharmaceutical. Myocardial SPECT was subsequently performed after additional radiopharmaceutical injection during pharmacologic-stress (Lexiscan)supervised by the Cardiology staff.  Quantitative gated imaging was also performed to evaluate left ventricular wall motion, and estimate left ventricular ejection fraction.  Radiopharmaceutical:  10 mCi Tc-65m Myoview at rest  and 30 mCi during stress.  Comparison: MRCP 06/02/2011.  Abdominal CT 06/01/2011.  Findings:   The source planar images demonstrate prominent subdiaphragmatic activity and situs inversus.  Post processing is affected by the patient's altered anatomy.  The computer generated labeling appears inconsistent.  SPECT images demonstrate decreased activity at the apex.  There appears to be a reversible component involving the distal anterolateral wall.  Gated cine images were reviewed on the workstation and demonstrate apical and septal hypokinesis.  The QGS ejection fraction measured at rest is 28% with an end diastolic volume of 43 ml and an end- systolic volume of 31 ml.  IMPRESSION:  1.  Abnormal study demonstrating dilatation of the left ventricle and left ventricular dysfunction.  Calculated left ventricular ejection fraction is 28%. 2.  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 There is a probable apical infarct with a possible area of reversibility involving the distal anterolateral wall concerning for ischemia.  Original Report Authenticated By: Gerrianne Scale, M.D.    Anti-infectives: Anti-infectives     Start     Dose/Rate Route Frequency Ordered Stop   06/05/11 1300   ciprofloxacin (CIPRO) IVPB 400 mg     Comments: pharamcy dose      400 mg 200 mL/hr over 60 Minutes Intravenous Every 12 hours 06/05/11 1130     06/05/11 1300   metroNIDAZOLE (FLAGYL) IVPB 500 mg        500 mg 100 mL/hr over 60 Minutes Intravenous 4 times per day 06/05/11 1130     06/04/11 1200   imipenem-cilastatin (PRIMAXIN)  250 mg in sodium chloride 0.9 % 100 mL IVPB  Status:  Discontinued        250 mg 200 mL/hr over 30 Minutes Intravenous 4 times per day 06/04/11 1031 06/05/11 1130   06/02/11 2200   imipenem-cilastatin (PRIMAXIN) 500 mg in sodium chloride 0.9 % 100 mL IVPB  Status:  Discontinued        500 mg 200 mL/hr over 30 Minutes Intravenous 3 times per day 06/02/11 1226 06/04/11 1031   06/02/11  0600   metroNIDAZOLE (FLAGYL) IVPB 500 mg  Status:  Discontinued        500 mg 100 mL/hr over 60 Minutes Intravenous Every 8 hours 06/02/11 0504 06/02/11 0511   06/02/11 0600   imipenem-cilastatin (PRIMAXIN) 500 mg in sodium chloride 0.9 % 100 mL IVPB  Status:  Discontinued        500 mg 200 mL/hr over 30 Minutes Intravenous 4 times per day 06/02/11 0513 06/02/11 1226   06/02/11 0445   metroNIDAZOLE (FLAGYL) IVPB 500 mg  Status:  Discontinued        500 mg 100 mL/hr over 60 Minutes Intravenous  Once 06/02/11 0431 06/02/11 0432   06/02/11 0430   ertapenem (INVANZ) 1 g in sodium chloride 0.9 % 50 mL IVPB  Status:  Discontinued        1 g 100 mL/hr over 30 Minutes Intravenous Daily 06/02/11 0400 06/02/11 0512   06/01/11 2230   ciprofloxacin (CIPRO) IVPB 400 mg        400 mg 200 mL/hr over 60 Minutes Intravenous  Once 06/01/11 2215 06/02/11 0015   06/01/11 2230   metroNIDAZOLE (FLAGYL) IVPB 500 mg  Status:  Discontinued        500 mg 100 mL/hr over 60 Minutes Intravenous Every 8 hours 06/01/11 2215 06/02/11 0402           Assessment/Plan  1. Cholangitis 2. S/p ERCP with sphincterotomy 3. Abnormal myoview  Plan: 1. Will follow along.  Will need to wait for card cath to be done before making any decisions about surgical intervention.   LOS: 9 days    Alisandra Son E 06/10/2011

## 2011-06-10 NOTE — Progress Notes (Signed)
Subjective: Chart reviewed, 70 y/o f admitted with abdominal pain, SIRS, found to have choledocholethiasis/ cholangitis.s/p ERCP, sphincterotomy, CBD stone extraction.Now plan for cholecystectomy during this admission, once she is medically stable.  Myoview is abnormal, Plan is for cardiac cath on Monday.  Objective: Vital signs in last 24 hours: Temp:  [97.1 F (36.2 C)-98.2 F (36.8 C)] 98 F (36.7 C) (01/06 0738) Pulse Rate:  [93-96] 96  (01/06 0738) Resp:  [16-26] 26  (01/06 0738) BP: (93-114)/(49-87) 109/62 mmHg (01/06 0738) SpO2:  [93 %-100 %] 97 % (01/06 0801) Weight:  [95.8 kg (211 lb 3.2 oz)] 211 lb 3.2 oz (95.8 kg) (01/06 0400) Weight change: -5.7 kg (-12 lb 9.1 oz) Last BM Date: 06/08/11  Intake/Output from previous day: 01/05 0701 - 01/06 0700 In: 1420 [P.O.:1400; I.V.:20] Out: 3100 [Urine:3100] Total I/O In: 480 [P.O.:480] Out: 500 [Urine:500]   Physical Exam: General: Alert, awake, oriented x3, in no acute distress. HEENT: No bruits, no goiter. Heart: Regular rate and rhythm, without murmurs, rubs, gallops. Lungs: Clear to auscultation bilaterally. Abdomen: Soft, nontender, nondistended, positive bowel sounds. Extremities: No clubbing cyanosis or edema with positive pedal pulses. Neuro: Grossly intact, nonfocal.    Lab Results: Basic Metabolic Panel:  Basename 06/10/11 0350 06/09/11 0400 06/08/11 0640  NA 136 136 --  K 4.1 3.2* --  CL 93* 92* --  CO2 34* 35* --  GLUCOSE 108* 136* --  BUN 28* 37* --  CREATININE 1.48* 1.58* --  CALCIUM 8.2* 8.4 --  MG -- -- 1.8  PHOS -- -- --   Liver Function Tests:  Basename 06/10/11 0350 06/09/11 0400  AST 17 23  ALT 17 22  ALKPHOS 136* 159*  BILITOT 0.9 1.0  PROT 5.9* 5.9*  ALBUMIN 2.5* 2.5*   No results found for this basename: LIPASE:2,AMYLASE:2 in the last 72 hours No results found for this basename: AMMONIA:2 in the last 72 hours CBC:  Basename 06/10/11 0350 06/09/11 0400  WBC 13.4* 12.9*    NEUTROABS -- --  HGB 12.6 12.8  HCT 38.5 40.1  MCV 92.8 93.3  PLT 248 265   CBG:  Basename 06/10/11 0803 06/10/11 0344 06/10/11 0022 06/09/11 1923 06/09/11 1624 06/09/11 1211  GLUCAP 141* 121* 111* 172* 204* 201*    Recent Results (from the past 240 hour(s))  CULTURE, BLOOD (ROUTINE X 2)     Status: Normal   Collection Time   06/02/11  5:00 AM      Component Value Range Status Comment   Specimen Description BLOOD RIGHT ARM   Final    Special Requests BOTTLES DRAWN AEROBIC AND ANAEROBIC 10CC   Final    Setup Time [id number]   Final    Culture NO GROWTH 5 DAYS   Final    Report Status 06/08/2011 FINAL   Final   URINE CULTURE     Status: Normal   Collection Time   06/02/11  6:14 AM      Component Value Range Status Comment   Specimen Description URINE, CATHETERIZED   Final    Special Requests NONE   Final    Setup Time [id number]   Final    Colony Count NO GROWTH   Final    Culture NO GROWTH   Final    Report Status 06/03/2011 FINAL   Final   MRSA PCR SCREENING     Status: Normal   Collection Time   06/02/11  8:40 AM      Component Value Range Status Comment  MRSA by PCR NEGATIVE  NEGATIVE  Final   CULTURE, SPUTUM-ASSESSMENT     Status: Normal   Collection Time   06/02/11 11:05 PM      Component Value Range Status Comment   Specimen Description SPUTUM   Final    Special Requests NONE   Final    Sputum evaluation     Final    Value: MICROSCOPIC FINDINGS SUGGEST THAT THIS SPECIMEN IS NOT REPRESENTATIVE OF LOWER RESPIRATORY SECRETIONS. PLEASE RECOLLECT.     CALLED TO Lianne Bushy RN AT 2325 ON [id number]   Report Status 06/02/2011 FINAL   Final     Studies/Results: Nm Myocar Multi W/spect W/wall Motion / Ef  06/08/2011  *RADIOLOGY REPORT*  Clinical Data:  Chest pain with elevated cardiac enzymes and LV dysfunction.  Situs inversus.  MYOCARDIAL IMAGING WITH SPECT (REST AND PHARMACOLOGIC-STRESS) GATED LEFT VENTRICULAR WALL MOTION STUDY LEFT VENTRICULAR EJECTION FRACTION   Technique:  Resting myocardial SPECT imaging was initially performed after intravenous administration of radiopharmaceutical. Myocardial SPECT was subsequently performed after additional radiopharmaceutical injection during pharmacologic-stress (Lexiscan)supervised by the Cardiology staff.  Quantitative gated imaging was also performed to evaluate left ventricular wall motion, and estimate left ventricular ejection fraction.  Radiopharmaceutical:  10 mCi Tc-68m Myoview at rest and 30 mCi during stress.  Comparison: MRCP 06/02/2011.  Abdominal CT 06/01/2011.  Findings:   The source planar images demonstrate prominent subdiaphragmatic activity and situs inversus.  Post processing is affected by the patient's altered anatomy.  The computer generated labeling appears inconsistent.  SPECT images demonstrate decreased activity at the apex.  There appears to be a reversible component involving the distal anterolateral wall.  Gated cine images were reviewed on the workstation and demonstrate apical and septal hypokinesis.  The QGS ejection fraction measured at rest is 28% with an end diastolic volume of 43 ml and an end- systolic volume of 31 ml.  IMPRESSION:  1.  Abnormal study demonstrating dilatation of the left ventricle and left ventricular dysfunction.  Calculated left ventricular ejection fraction is 28%. 2.  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 There is a probable apical infarct with a possible area of reversibility involving the distal anterolateral wall concerning for ischemia.  Original Report Authenticated By: Gerrianne Scale, M.D.    Medications: Scheduled Meds:    . albuterol  2.5 mg Nebulization Q6H  . captopril  6.25 mg Oral BID  . ciprofloxacin  400 mg Intravenous Q12H  . heparin subcutaneous  5,000 Units Subcutaneous Q8H  . insulin aspart  0-15 Units Subcutaneous Q4H  . insulin glargine  5 Units Subcutaneous QHS  . ipratropium  0.5 mg Nebulization Q6H  . magnesium  oxide  400 mg Oral Daily  . metronidazole  500 mg Intravenous Q6H  . pantoprazole  40 mg Oral QHS  . predniSONE  10 mg Oral Q breakfast  . sodium chloride  1,000 mL Intravenous Once  . sodium chloride  3 mL Intravenous Q12H  . sodium chloride      . sodium chloride      . sodium chloride      . DISCONTD: furosemide  40 mg Oral BID  . DISCONTD: pantoprazole (PROTONIX) IV  40 mg Intravenous Q24H   Continuous Infusions:    . sodium chloride 20 mL/hr at 06/10/11 0500   PRN Meds:.sodium chloride, albuterol, ondansetron, sodium chloride  Assessment/Plan:  SIRS/Sepstic shock: Resolved.   Cholangitis: Improving, on clear liquid diet. GI and surgery following. LFT's are normal. Cont  Cipro and Flagyl. WBC mildly elevated.   CHF: Pt started on diuresis with lasix. Improvement in shortness of breath. Cardiology following Myoview abnormal. Card cath on Monday.  S/P VDRF: Stable  AKI: Sec to septic shock Resolving. Cont to monitor.  Anemia: H&H is stable.  Fractured right elbow: Immobilized for comfort F/u Ortho as outpatient.  Hypokalemia: Potassium replaced.  Hyperglycemia: Continue SSI  DVT Prophylaxis: Heparin     LOS: 9 days   Anays Detore S [facility] Pager: [phone] 06/10/2011, 10:31 AM

## 2011-06-10 NOTE — Progress Notes (Signed)
Await medical issue resolution prior to surgical intervention.

## 2011-06-10 NOTE — Progress Notes (Signed)
Patient ID: [name], female   DOB: Sep 17, 1940, 71 y.o.   MRN: [id number] SUBJECTIVE: Patient is feeling well today. She is not having any chest pain or shortness of breath.  Filed Vitals:   06/10/11 0105 06/10/11 0400 06/10/11 0738 06/10/11 0801  BP:  99/49 109/62   Pulse:  93 96   Temp:  98.1 F (36.7 C) 98 F (36.7 C)   TempSrc:  Oral Oral   Resp:  16 26   Height:      Weight:  211 lb 3.2 oz (95.8 kg)    SpO2: 98% 93% 96% 97%    Intake/Output Summary (Last 24 hours) at 06/10/11 0958 Last data filed at 06/10/11 0900  Gross per 24 hour  Intake   1540 ml  Output   3050 ml  Net  -1510 ml    LABS: Basic Metabolic Panel:  Basename 06/10/11 0350 06/09/11 0400 06/08/11 0640  NA 136 136 --  K 4.1 3.2* --  CL 93* 92* --  CO2 34* 35* --  GLUCOSE 108* 136* --  BUN 28* 37* --  CREATININE 1.48* 1.58* --  CALCIUM 8.2* 8.4 --  MG -- -- 1.8  PHOS -- -- --   Liver Function Tests:  Basename 06/10/11 0350 06/09/11 0400  AST 17 23  ALT 17 22  ALKPHOS 136* 159*  BILITOT 0.9 1.0  PROT 5.9* 5.9*  ALBUMIN 2.5* 2.5*   No results found for this basename: LIPASE:2,AMYLASE:2 in the last 72 hours CBC:  Basename 06/10/11 0350 06/09/11 0400  WBC 13.4* 12.9*  NEUTROABS -- --  HGB 12.6 12.8  HCT 38.5 40.1  MCV 92.8 93.3  PLT 248 265   Cardiac Enzymes: No results found for this basename: CKTOTAL:3,CKMB:3,CKMBINDEX:3,TROPONINI:3 in the last 72 hours BNP: No components found with this basename: POCBNP:3 D-Dimer: No results found for this basename: DDIMER:2 in the last 72 hours Hemoglobin A1C: No results found for this basename: HGBA1C in the last 72 hours Fasting Lipid Panel: No results found for this basename: CHOL,HDL,LDLCALC,TRIG,CHOLHDL,LDLDIRECT in the last 72 hours Thyroid Function Tests: No results found for this basename: TSH,T4TOTAL,FREET3,T3FREE,THYROIDAB in the last 72 hours  RADIOLOGY:   PHYSICAL EXAM    Patient is oriented to person time and place. Affect  is normal. There is no jugulovenous distention. Lungs are clear. Respiratory effort is nonlabored. Cardiac exam reveals S1 and S2. There no clicks or significant murmurs. The abdomen is soft. Her right arm is bandaged. There is no significant peripheral edema.   TELEMETRY: I have reviewed telemetry. There is normal sinus rhythm.  ASSESSMENT AND PLAN:  Active Problems:  Troponin level elevated   Acute myocardial infarction, subendocardial infarction, subsequent episode of care   Plans are now being made for cardiac catheterization on June 11, 2011. I have carefully written the precath orders.   Acute systolic congestive heart failure    The patient has diaries further. Her edema is now gone. I will stop any further diuretics until her catheterization has been completed.   Acute renal failure     Creatinine is slightly better today of 1.48. Careful attention will be given to the dye load that she receives.    Hypokalemia   Potassium today is improved.  Hypomagnesemia   Dextrocardia Careful attention will be paid to the fact that she has dextrocardia at the time of her heart catheterization.   Willa Rough 06/10/2011 9:58 AM

## 2011-06-11 ENCOUNTER — Encounter (HOSPITAL_COMMUNITY)
Admission: EM | Disposition: A | Discharge status: Home Health | Payer: Self-pay | Source: Home / Self Care | Attending: Surgery

## 2011-06-11 DIAGNOSIS — I214 Non-ST elevation (NSTEMI) myocardial infarction: Secondary | ICD-10-CM

## 2011-06-11 DIAGNOSIS — I2510 Atherosclerotic heart disease of native coronary artery without angina pectoris: Secondary | ICD-10-CM

## 2011-06-11 HISTORY — PX: LEFT HEART CATHETERIZATION WITH CORONARY ANGIOGRAM: SHX5451

## 2011-06-11 HISTORY — PX: CARDIAC CATHETERIZATION: SHX172

## 2011-06-11 HISTORY — PX: ARCH AORTOGRAM: SHX5501

## 2011-06-11 LAB — GLUCOSE, CAPILLARY
Glucose-Capillary: 134 mg/dL — ABNORMAL HIGH (ref 70–99)
Glucose-Capillary: 136 mg/dL — ABNORMAL HIGH (ref 70–99)
Glucose-Capillary: 190 mg/dL — ABNORMAL HIGH (ref 70–99)
Glucose-Capillary: 86 mg/dL (ref 70–99)

## 2011-06-11 LAB — COMPREHENSIVE METABOLIC PANEL
ALT: 17 U/L (ref 0–35)
Albumin: 2.6 g/dL — ABNORMAL LOW (ref 3.5–5.2)
Alkaline Phosphatase: 129 U/L — ABNORMAL HIGH (ref 39–117)
Glucose, Bld: 136 mg/dL — ABNORMAL HIGH (ref 70–99)
Potassium: 3.9 mEq/L (ref 3.5–5.1)
Sodium: 132 mEq/L — ABNORMAL LOW (ref 135–145)
Total Protein: 6.3 g/dL (ref 6.0–8.3)

## 2011-06-11 LAB — CBC
Hemoglobin: 12.7 g/dL (ref 12.0–15.0)
MCHC: 32.7 g/dL (ref 30.0–36.0)
RDW: 15.1 % (ref 11.5–15.5)

## 2011-06-11 SURGERY — LEFT HEART CATHETERIZATION WITH CORONARY ANGIOGRAM
Anesthesia: LOCAL | Laterality: Right

## 2011-06-11 MED ORDER — LIDOCAINE HCL (PF) 1 % IJ SOLN
INTRAMUSCULAR | Status: AC
  Filled 2011-06-11: qty 30

## 2011-06-11 MED ORDER — METOPROLOL TARTRATE 12.5 MG HALF TABLET
12.5000 mg | ORAL_TABLET | Freq: Two times a day (BID) | ORAL | Status: DC
  Administered 2011-06-11 – 2011-06-13 (×5): 12.5 mg via ORAL
  Filled 2011-06-11 (×8): qty 1

## 2011-06-11 MED ORDER — SODIUM CHLORIDE 0.9 % IV SOLN
INTRAVENOUS | Status: AC
  Administered 2011-06-11: 16:00:00 via INTRAVENOUS

## 2011-06-11 MED ORDER — LORAZEPAM 0.5 MG PO TABS
0.5000 mg | ORAL_TABLET | Freq: Four times a day (QID) | ORAL | Status: DC | PRN
  Administered 2011-06-11: 0.5 mg via ORAL
  Filled 2011-06-11: qty 1

## 2011-06-11 MED ORDER — SODIUM CHLORIDE 0.9 % IJ SOLN
INTRAMUSCULAR | Status: AC
  Administered 2011-06-11: 10 mL
  Filled 2011-06-11: qty 10

## 2011-06-11 MED ORDER — ASPIRIN EC 81 MG PO TBEC
81.0000 mg | DELAYED_RELEASE_TABLET | Freq: Every day | ORAL | Status: DC
  Administered 2011-06-11 – 2011-06-13 (×3): 81 mg via ORAL
  Filled 2011-06-11 (×4): qty 1

## 2011-06-11 MED ORDER — CLOPIDOGREL BISULFATE 300 MG PO TABS
ORAL_TABLET | ORAL | Status: AC
  Filled 2011-06-11: qty 2

## 2011-06-11 MED ORDER — BIVALIRUDIN 250 MG IV SOLR
INTRAVENOUS | Status: AC
  Filled 2011-06-11: qty 250

## 2011-06-11 MED ORDER — HEPARIN (PORCINE) IN NACL 2-0.9 UNIT/ML-% IJ SOLN
INTRAMUSCULAR | Status: AC
  Filled 2011-06-11: qty 2000

## 2011-06-11 MED ORDER — ACETAMINOPHEN 325 MG PO TABS
650.0000 mg | ORAL_TABLET | ORAL | Status: DC | PRN
  Administered 2011-06-13: 650 mg via ORAL
  Filled 2011-06-11: qty 2

## 2011-06-11 MED ORDER — MIDAZOLAM HCL 2 MG/2ML IJ SOLN
INTRAMUSCULAR | Status: AC
  Filled 2011-06-11: qty 2

## 2011-06-11 MED ORDER — NITROGLYCERIN 0.2 MG/ML ON CALL CATH LAB
INTRAVENOUS | Status: AC
  Filled 2011-06-11: qty 1

## 2011-06-11 NOTE — Progress Notes (Signed)
NITIAL ADULT NUTRITION ASSESSMENT Date: 06/11/2011   Time: 2:15 PM  Reason for Assessment: Prolonged inadequate diet  ASSESSMENT: Female 71 y.o.  Dx: Cholangitis s/p ERCP, sphincterotomy, CBD stone extraction   Hx:  Past Medical History  Diagnosis Date  . Arthritis   . Neuropathy   . Reflux   . Hypertension   . Elevated cholesterol   . Situs inversus   . Dextrocardia     Related Meds:  Scheduled Meds:   . albuterol  2.5 mg Nebulization Q6H  . aspirin  324 mg Oral Pre-Cath  . aspirin EC  81 mg Oral Daily  . captopril  6.25 mg Oral BID  . ciprofloxacin  400 mg Intravenous Q12H  . diazepam  5 mg Oral On Call  . heparin subcutaneous  5,000 Units Subcutaneous Q8H  . insulin aspart  0-15 Units Subcutaneous Q4H  . insulin glargine  5 Units Subcutaneous QHS  . ipratropium  0.5 mg Nebulization Q6H  . metoprolol tartrate  12.5 mg Oral BID  . metronidazole  500 mg Intravenous Q6H  . pantoprazole  40 mg Oral QHS  . predniSONE  10 mg Oral Q breakfast  . sodium chloride  1,000 mL Intravenous Once  . sodium chloride  3 mL Intravenous Q12H  . sodium chloride      . sodium chloride       Continuous Infusions:   . sodium chloride 20 mL/hr at 06/11/11 0409  . DISCONTD: sodium chloride 1 mL/kg/hr (06/11/11 0600)   PRN Meds:.sodium chloride, albuterol, LORazepam, ondansetron, sodium chloride   Ht: 5\' 6"  (167.6 cm)  Wt: 222 lb 10.6 oz (101 kg)  Ideal Wt: 59.1 kg % Ideal Wt: 171%  Usual Wt: unknown  Body mass index is 35.94 kg/(m^2).  Food/Nutrition Related Hx: unknown PTA; has been NPO or on Clear Liquids since admission.  Labs:  CMP     Component Value Date/Time   NA 132* 06/11/2011 0400   K 3.9 06/11/2011 0400   CL 93* 06/11/2011 0400   CO2 32 06/11/2011 0400   GLUCOSE 136* 06/11/2011 0400   BUN 23 06/11/2011 0400   CREATININE 1.37* 06/11/2011 0400   CALCIUM 8.8 06/11/2011 0400   PROT 6.3 06/11/2011 0400   ALBUMIN 2.6* 06/11/2011 0400   AST 17 06/11/2011 0400   ALT 17 06/11/2011  0400   ALKPHOS 129* 06/11/2011 0400   BILITOT 1.0 06/11/2011 0400   GFRNONAA 38* 06/11/2011 0400   GFRAA 44* 06/11/2011 0400   CBG (last 3)   Basename 06/11/11 1159 06/11/11 0822 06/11/11 0403  GLUCAP 141* 139* 134*    Intake/Output Summary (Last 24 hours) at 06/11/11 1420 Last data filed at 06/11/11 1304  Gross per 24 hour  Intake 3627.83 ml  Output   4750 ml  Net -1122.17 ml   Diet Order:  NPO  IVF:    sodium chloride Last Rate: 20 mL/hr at 06/11/11 0409  DISCONTD: sodium chloride Last Rate: 1 mL/kg/hr (06/11/11 0600)    Estimated Nutritional Needs:   Kcal: 1600-1800 Protein: 90-110 grams Fluid: 1.6-1.8 liters  Patient is currently in the cardiac cath lab.  Has been NPO today for the procedure.  Previously on Clear Liquids for a few days.  Noted patient with some nausea today.  NUTRITION DIAGNOSIS: -Inadequate oral intake (NI-2.1).  Status: Ongoing  RELATED TO: altered GI function  AS EVIDENCED BY: prolonged NPO status  MONITORING/EVALUATION(Goals): Goal:  Tolerate diet advancement with adequate oral intake to meet nutrition needs. Monitor:  Diet  advancement, labs, weight trend.  EDUCATION NEEDS: -Education not appropriate at this time  INTERVENTION: If unable to tolerate diet advancement in the next 24-48 hours, may need to consider TPN for nutrition support.   Dietitian #:  [phone]  DOCUMENTATION CODES Per approved criteria  -Obesity Unspecified    Hettie Holstein 06/11/2011, 2:15 PM

## 2011-06-11 NOTE — Progress Notes (Signed)
Subjective: Chart reviewed, 71 y/o f admitted with abdominal pain, SIRS, found to have choledocholethiasis/ cholangitis.s/p ERCP, sphincterotomy, CBD stone extraction.Now plan for cholecystectomy during this admission, once she is medically stable.  Myoview is abnormal, Plan is for cardiac cath today.  Objective: Vital signs in last 24 hours: Temp:  [98.3 F (36.8 C)-99.4 F (37.4 C)] 99.1 F (37.3 C) (01/07 0425) Pulse Rate:  [89-100] 89  (01/07 0425) Resp:  [18-24] 18  (01/07 0425) BP: (98-120)/(54-63) 120/63 mmHg (01/07 0425) SpO2:  [92 %-98 %] 92 % (01/07 0425) Weight:  [100.5 kg (221 lb 9 oz)-101 kg (222 lb 10.6 oz)] 222 lb 10.6 oz (101 kg) (01/07 0500) Weight change: 4.7 kg (10 lb 5.8 oz) Last BM Date: 06/08/11  Intake/Output from previous day: 01/06 0701 - 01/07 0700 In: 1320 [P.O.:1320] Out: 3700 [Urine:3700]     Physical Exam: General: Alert, awake, oriented x3, in no acute distress. HEENT: No bruits, no goiter. Heart: Regular rate and rhythm, without murmurs, rubs, gallops. Lungs: Clear to auscultation bilaterally. Abdomen: Soft, nontender, nondistended, positive bowel sounds. Extremities: No clubbing cyanosis or edema with positive pedal pulses. RUE: Right elbow in splint. Neuro: Grossly intact, nonfocal.    Lab Results: Basic Metabolic Panel:  Basename 06/11/11 0400 06/10/11 0350  NA 132* 136  K 3.9 4.1  CL 93* 93*  CO2 32 34*  GLUCOSE 136* 108*  BUN 23 28*  CREATININE 1.37* 1.48*  CALCIUM 8.8 8.2*  MG -- --  PHOS -- --   Liver Function Tests:  Basename 06/11/11 0400 06/10/11 0350  AST 17 17  ALT 17 17  ALKPHOS 129* 136*  BILITOT 1.0 0.9  PROT 6.3 5.9*  ALBUMIN 2.6* 2.5*   CBC:  Basename 06/11/11 0400 06/10/11 0350  WBC 14.9* 13.4*  NEUTROABS -- --  HGB 12.7 12.6  HCT 38.8 38.5  MCV 93.0 92.8  PLT 222 248   CBG:  Basename 06/11/11 0822 06/11/11 0403 06/11/11 0033 06/10/11 2011 06/10/11 1623 06/10/11 1210  GLUCAP 139* 134* 136*  171* 192* 225*    Recent Results (from the past 240 hour(s))  CULTURE, BLOOD (ROUTINE X 2)     Status: Normal   Collection Time   06/02/11  5:00 AM      Component Value Range Status Comment   Specimen Description BLOOD RIGHT ARM   Final    Special Requests BOTTLES DRAWN AEROBIC AND ANAEROBIC 10CC   Final    Setup Time [id number]   Final    Culture NO GROWTH 5 DAYS   Final    Report Status 06/08/2011 FINAL   Final   URINE CULTURE     Status: Normal   Collection Time   06/02/11  6:14 AM      Component Value Range Status Comment   Specimen Description URINE, CATHETERIZED   Final    Special Requests NONE   Final    Setup Time [id number]   Final    Colony Count NO GROWTH   Final    Culture NO GROWTH   Final    Report Status 06/03/2011 FINAL   Final   MRSA PCR SCREENING     Status: Normal   Collection Time   06/02/11  8:40 AM      Component Value Range Status Comment   MRSA by PCR NEGATIVE  NEGATIVE  Final   CULTURE, SPUTUM-ASSESSMENT     Status: Normal   Collection Time   06/02/11 11:05 PM      Component  Value Range Status Comment   Specimen Description SPUTUM   Final    Special Requests NONE   Final    Sputum evaluation     Final    Value: MICROSCOPIC FINDINGS SUGGEST THAT THIS SPECIMEN IS NOT REPRESENTATIVE OF LOWER RESPIRATORY SECRETIONS. PLEASE RECOLLECT.     CALLED TO Lianne Bushy RN AT [phone] ON [id number]   Report Status 06/02/2011 FINAL   Final     Studies/Results: No results found.  Medications: Scheduled Meds:    . albuterol  2.5 mg Nebulization Q6H  . aspirin  324 mg Oral Pre-Cath  . captopril  6.25 mg Oral BID  . ciprofloxacin  400 mg Intravenous Q12H  . diazepam  5 mg Oral On Call  . heparin subcutaneous  5,000 Units Subcutaneous Q8H  . insulin aspart  0-15 Units Subcutaneous Q4H  . insulin glargine  5 Units Subcutaneous QHS  . ipratropium  0.5 mg Nebulization Q6H  . magnesium oxide  400 mg Oral Daily  . metronidazole  500 mg Intravenous Q6H  . pantoprazole   40 mg Oral QHS  . predniSONE  10 mg Oral Q breakfast  . sodium chloride  1,000 mL Intravenous Once  . sodium chloride  3 mL Intravenous Q12H  . sodium chloride      . sodium chloride      . DISCONTD: furosemide  40 mg Oral BID   Continuous Infusions:    . sodium chloride 20 mL/hr at 06/11/11 0409  . sodium chloride 1 mL/kg/hr (06/11/11 0600)   PRN Meds:.sodium chloride, albuterol, ondansetron, sodium chloride  Assessment/Plan:  SIRS/Sepstic shock: Resolved.   Cholangitis: Improving, on clear liquid diet. GI and surgery following. LFT's are normal. Cont Cipro and Flagyl.    CHF: Pt started on diuresis with lasix. Improvement in shortness of breath. Cardiology following Myoview abnormal. Card cath today.  S/P VDRF: Stable  AKI: Sec to septic shock Resolving. Cont to monitor.  Anemia: H&H is stable.  Fractured right elbow: Immobilized for comfort Cont splint. PT consulted for ROM F/u Ortho as outpatient.  Dextrocardia: Cardiology aware.  Hyperglycemia:  BG Stable. Continue SSI  DVT Prophylaxis: Heparin     LOS: 10 days   Keishana Klinger S [facility] Pager: [phone] 06/11/2011, 9:03 AM

## 2011-06-11 NOTE — Progress Notes (Addendum)
Patient Name: [name] Date of Encounter: 06/11/2011     Active Problems:  Troponin level elevated  Acute myocardial infarction, subendocardial infarction, subsequent episode of care  Acute systolic congestive heart failure  Acute renal failure  Hypokalemia  Hypomagnesemia  Dextrocardia    SUBJECTIVE:  No c/p, sob.  C/o mild abd soreness and nausea.  Anxious re: pending cath.   CURRENT MEDS    . albuterol  2.5 mg Nebulization Q6H  . aspirin  324 mg Oral Pre-Cath  . captopril  6.25 mg Oral BID  . ciprofloxacin  400 mg Intravenous Q12H  . diazepam  5 mg Oral On Call  . heparin subcutaneous  5,000 Units Subcutaneous Q8H  . insulin aspart  0-15 Units Subcutaneous Q4H  . insulin glargine  5 Units Subcutaneous QHS  . ipratropium  0.5 mg Nebulization Q6H  . magnesium oxide  400 mg Oral Daily  . metronidazole  500 mg Intravenous Q6H  . pantoprazole  40 mg Oral QHS  . predniSONE  10 mg Oral Q breakfast  . sodium chloride  1,000 mL Intravenous Once  . sodium chloride  3 mL Intravenous Q12H  . sodium chloride      . sodium chloride        OBJECTIVE  Filed Vitals:   06/10/11 2357 06/11/11 0425 06/11/11 0500 06/11/11 0800  BP: 98/54 120/63  119/70  Pulse: 100 89  91  Temp: 99.4 F (37.4 C) 99.1 F (37.3 C)  99.4 F (37.4 C)  TempSrc: Oral Oral  Oral  Resp: 24 18  17   Height:      Weight:   222 lb 10.6 oz (101 kg)   SpO2: 97% 92%  98%    Intake/Output Summary (Last 24 hours) at 06/11/11 1014 Last data filed at 06/11/11 0800  Gross per 24 hour  Intake    840 ml  Output   4100 ml  Net  -3260 ml    PHYSICAL EXAM  General: Well developed, well nourished, in no acute distress. Head: Normocephalic, atraumatic, sclera non-icteric, no xanthomas, nares are without discharge.  Neck: Supple without bruits or JVD. Lungs:  Resp regular and unlabored, diminished breath sounds bilat. Heart: RRR no s3, s4, or murmurs - dextrocardia. Abdomen: Soft, diffuse,  mildl"soreness" to palpation., non-distended, BS + x 4.  Msk:  Strength and tone appears normal for age. Extremities: No clubbing, cyanosis or edema. DP/PT/Radials 1+ and equal bilaterally. Neuro: Alert and oriented X 3. Moves all extremities spontaneously. Psych: Normal affect.  LABS:  CBC:  Basename 06/11/11 0400 06/10/11 0350  WBC 14.9* 13.4*  NEUTROABS -- --  HGB 12.7 12.6  HCT 38.8 38.5  MCV 93.0 92.8  PLT 222 248   Basic Metabolic Panel:  Basename 06/11/11 0400 06/10/11 0350  NA 132* 136  K 3.9 4.1  CL 93* 93*  CO2 32 34*  GLUCOSE 136* 108*  BUN 23 28*  CREATININE 1.37* 1.48*  CALCIUM 8.8 8.2*  MG -- --  PHOS -- --   Liver Function Tests:  Basename 06/11/11 0400 06/10/11 0350  AST 17 17  ALT 17 17  ALKPHOS 129* 136*  BILITOT 1.0 0.9  PROT 6.3 5.9*  ALBUMIN 2.6* 2.5*     TELE  RSR    Radiology/Studies:  06/08/2011 Suzan Garibaldi MV  IMPRESSION:  1. Abnormal study demonstrating dilatation of the left ventricle and left ventricular dysfunction. Calculated left ventricular ejection fraction is 28%. 2. Perfusion study is limited by the patient's  situs inversus. There is a probable apical infarct with a possible area of reversibility involving the distal anterolateral wall concerning for ischemia.  ASSESSMENT AND PLAN:  1.  Type II NSTEMI:  In setting of GB dzs and hypotension.  No c/p.  For cath today.  Cont [name].  Add BB (may need to reduce ACE dose to make for Blood pressure room).  Add Statin when ok with GI/surgery (has had elevated LFT's - improving).  2.  ICM:  EF 45% by echo.  Volume looks good.  Off lasix.  Cont acei.  Add BB as above.  3.  GB dzs -  choledocholethiasis/ cholangitis:  s/p ERCP, sphincterotomy, CBD stone extraction.  Plan for cholecystectomy following cardiac w/u.  4.  DM:  Per primary team.  5.  Renal Failure:  Improving.  Cont acei.  6.  Anxiety:  Re: cath.  Will write for PRN ativan.    Signed, Nicolasa Ducking  NP   I have seen and examined this patient. I agree with the findings above. Cath today.   Shirlie Enck 2:28 PM 06/11/2011

## 2011-06-11 NOTE — Progress Notes (Signed)
Notified Ward Givens NP of patients assessment, 1806 patient had  7 beat run of VT at rate of around 150 bpm. Patients systolic blood pressure around 75 and patient remains asymptomatic, Right groin site stable.  No change in treatment at this time.

## 2011-06-11 NOTE — H&P (View-Only) (Signed)
Patient Name: [name] Date of Encounter: 06/11/2011     Active Problems:  Troponin level elevated  Acute myocardial infarction, subendocardial infarction, subsequent episode of care  Acute systolic congestive heart failure  Acute renal failure  Hypokalemia  Hypomagnesemia  Dextrocardia    SUBJECTIVE:  No c/p, sob.  C/o mild abd soreness and nausea.  Anxious re: pending cath.   CURRENT MEDS    . albuterol  2.5 mg Nebulization Q6H  . aspirin  324 mg Oral Pre-Cath  . captopril  6.25 mg Oral BID  . ciprofloxacin  400 mg Intravenous Q12H  . diazepam  5 mg Oral On Call  . heparin subcutaneous  5,000 Units Subcutaneous Q8H  . insulin aspart  0-15 Units Subcutaneous Q4H  . insulin glargine  5 Units Subcutaneous QHS  . ipratropium  0.5 mg Nebulization Q6H  . magnesium oxide  400 mg Oral Daily  . metronidazole  500 mg Intravenous Q6H  . pantoprazole  40 mg Oral QHS  . predniSONE  10 mg Oral Q breakfast  . sodium chloride  1,000 mL Intravenous Once  . sodium chloride  3 mL Intravenous Q12H  . sodium chloride      . sodium chloride        OBJECTIVE  Filed Vitals:   06/10/11 2357 06/11/11 0425 06/11/11 0500 06/11/11 0800  BP: 98/54 120/63  119/70  Pulse: 100 89  91  Temp: 99.4 F (37.4 C) 99.1 F (37.3 C)  99.4 F (37.4 C)  TempSrc: Oral Oral  Oral  Resp: 24 18  17   Height:      Weight:   222 lb 10.6 oz (101 kg)   SpO2: 97% 92%  98%    Intake/Output Summary (Last 24 hours) at 06/11/11 1014 Last data filed at 06/11/11 0800  Gross per 24 hour  Intake    840 ml  Output   4100 ml  Net  -3260 ml    PHYSICAL EXAM  General: Well developed, well nourished, in no acute distress. Head: Normocephalic, atraumatic, sclera non-icteric, no xanthomas, nares are without discharge.  Neck: Supple without bruits or JVD. Lungs:  Resp regular and unlabored, diminished breath sounds bilat. Heart: RRR no s3, s4, or murmurs - dextrocardia. Abdomen: Soft, diffuse,  mildl"soreness" to palpation., non-distended, BS + x 4.  Msk:  Strength and tone appears normal for age. Extremities: No clubbing, cyanosis or edema. DP/PT/Radials 1+ and equal bilaterally. Neuro: Alert and oriented X 3. Moves all extremities spontaneously. Psych: Normal affect.  LABS:  CBC:  Basename 06/11/11 0400 06/10/11 0350  WBC 14.9* 13.4*  NEUTROABS -- --  HGB 12.7 12.6  HCT 38.8 38.5  MCV 93.0 92.8  PLT 222 248   Basic Metabolic Panel:  Basename 06/11/11 0400 06/10/11 0350  NA 132* 136  K 3.9 4.1  CL 93* 93*  CO2 32 34*  GLUCOSE 136* 108*  BUN 23 28*  CREATININE 1.37* 1.48*  CALCIUM 8.8 8.2*  MG -- --  PHOS -- --   Liver Function Tests:  Basename 06/11/11 0400 06/10/11 0350  AST 17 17  ALT 17 17  ALKPHOS 129* 136*  BILITOT 1.0 0.9  PROT 6.3 5.9*  ALBUMIN 2.6* 2.5*     TELE  RSR    Radiology/Studies:  06/08/2011 Suzan Garibaldi MV  IMPRESSION:  1. Abnormal study demonstrating dilatation of the left ventricle and left ventricular dysfunction. Calculated left ventricular ejection fraction is 28%. 2. Perfusion study is limited by the patient's  situs inversus. There is a probable apical infarct with a possible area of reversibility involving the distal anterolateral wall concerning for ischemia.  ASSESSMENT AND PLAN:  1.  Type II NSTEMI:  In setting of GB dzs and hypotension.  No c/p.  For cath today.  Cont asa.  Add BB (may need to reduce ACE dose to make for Blood pressure room).  Add Statin when ok with GI/surgery (has had elevated LFT's - improving).  2.  ICM:  EF 45% by echo.  Volume looks good.  Off lasix.  Cont acei.  Add BB as above.  3.  GB dzs -  choledocholethiasis/ cholangitis:  s/p ERCP, sphincterotomy, CBD stone extraction.  Plan for cholecystectomy following cardiac w/u.  4.  DM:  Per primary team.  5.  Renal Failure:  Improving.  Cont acei.  6.  Anxiety:  Re: cath.  Will write for PRN ativan.    Signed, Nicolasa Ducking  NP

## 2011-06-11 NOTE — Interval H&P Note (Signed)
History and Physical Interval Note:  06/11/2011 2:27 PM  The patient is here today for a left heart catheterization. I have personally reviewed the labs and examined the patient. The History and Physical has been reviewed and there are no changes. The risks and benefits of the procedure have been reviewed with the patient. Informed consent has been signed by the patient and is present in the chart. The patient agrees to proceed with the procedure.    [name]

## 2011-06-11 NOTE — Progress Notes (Signed)
Site area: right groin  Site Prior to Removal:  Level 0  Pressure Applied For 20 MINUTES    Minutes Beginning at 1720 Manual:   yes  Patient Status During Pull:  Stable   Post Pull Groin Site:  Level 0  Post Pull Instructions Given:  yes  Post Pull Pulses Present:  yes  Dressing Applied:  yes  Comments:  Patients systolic blood pressure around 85 and patient asymptomatic prior to sheath pull. Spoke with Ward Givens NP regarding blood pressure and instructed it would be all right to pull sheath as patient is asymptomatic. Blood pressure medications will have parameters to hold for low pressure. Orders received. Patient tolerated sheath pull well with breathing instruction during time pressure held. Right groin site level 0 and gauze dressing applied with medipore tape.   Victorino December RN

## 2011-06-11 NOTE — Progress Notes (Signed)
8 Days Post-Op  Subjective: C/o some nausea, nervous about cath  Objective: Vital signs in last 24 hours: Temp:  [98.3 F (36.8 C)-99.4 F (37.4 C)] 99.1 F (37.3 C) (01/07 0425) Pulse Rate:  [89-100] 89  (01/07 0425) Resp:  [18-24] 18  (01/07 0425) BP: (98-120)/(54-63) 120/63 mmHg (01/07 0425) SpO2:  [92 %-98 %] 92 % (01/07 0425) Weight:  [221 lb 9 oz (100.5 kg)-222 lb 10.6 oz (101 kg)] 222 lb 10.6 oz (101 kg) (01/07 0500) Last BM Date: 06/08/11  Intake/Output from previous day: 01/06 0701 - 01/07 0700 In: 1320 [P.O.:1320] Out: 3700 [Urine:3700] Intake/Output this shift:    GI: soft nontender  Lab Results:   Basename 06/11/11 0400 06/10/11 0350  WBC 14.9* 13.4*  HGB 12.7 12.6  HCT 38.8 38.5  PLT 222 248   BMET  Basename 06/11/11 0400 06/10/11 0350  NA 132* 136  K 3.9 4.1  CL 93* 93*  CO2 32 34*  GLUCOSE 136* 108*  BUN 23 28*  CREATININE 1.37* 1.48*  CALCIUM 8.8 8.2*   PT/INR  Basename 06/11/11 0400  LABPROT 14.9  INR 1.15   ABG No results found for this basename: PHART:2,PCO2:2,PO2:2,HCO3:2 in the last 72 hours  Studies/Results: No results found.  Anti-infectives: Anti-infectives     Start     Dose/Rate Route Frequency Ordered Stop   06/05/11 1300   ciprofloxacin (CIPRO) IVPB 400 mg     Comments: pharamcy dose      400 mg 200 mL/hr over 60 Minutes Intravenous Every 12 hours 06/05/11 1130     06/05/11 1300   metroNIDAZOLE (FLAGYL) IVPB 500 mg        500 mg 100 mL/hr over 60 Minutes Intravenous 4 times per day 06/05/11 1130     06/04/11 1200   imipenem-cilastatin (PRIMAXIN) 250 mg in sodium chloride 0.9 % 100 mL IVPB  Status:  Discontinued        250 mg 200 mL/hr over 30 Minutes Intravenous 4 times per day 06/04/11 1031 06/05/11 1130   06/02/11 2200   imipenem-cilastatin (PRIMAXIN) 500 mg in sodium chloride 0.9 % 100 mL IVPB  Status:  Discontinued        500 mg 200 mL/hr over 30 Minutes Intravenous 3 times per day 06/02/11 1226 06/04/11 1031    06/02/11 0600   metroNIDAZOLE (FLAGYL) IVPB 500 mg  Status:  Discontinued        500 mg 100 mL/hr over 60 Minutes Intravenous Every 8 hours 06/02/11 0504 06/02/11 0511   06/02/11 0600   imipenem-cilastatin (PRIMAXIN) 500 mg in sodium chloride 0.9 % 100 mL IVPB  Status:  Discontinued        500 mg 200 mL/hr over 30 Minutes Intravenous 4 times per day 06/02/11 0513 06/02/11 1226   06/02/11 0445   metroNIDAZOLE (FLAGYL) IVPB 500 mg  Status:  Discontinued        500 mg 100 mL/hr over 60 Minutes Intravenous  Once 06/02/11 0431 06/02/11 0432   06/02/11 0430   ertapenem (INVANZ) 1 g in sodium chloride 0.9 % 50 mL IVPB  Status:  Discontinued        1 g 100 mL/hr over 30 Minutes Intravenous Daily 06/02/11 0400 06/02/11 0512   06/01/11 2230   ciprofloxacin (CIPRO) IVPB 400 mg        400 mg 200 mL/hr over 60 Minutes Intravenous  Once 06/01/11 2215 06/02/11 0015   06/01/11 2230   metroNIDAZOLE (FLAGYL) IVPB 500 mg  Status:  Discontinued        500 mg 100 mL/hr over 60 Minutes Intravenous Every 8 hours 06/01/11 2215 06/02/11 0402          Assessment/Plan: Cholangitis s/p ERCP CAD  Will await decision on cholecystectomy on cardiac cath today She has sphincterotomy pending cath as well as deconditioning currently will likely need delayed surgery as I think that cholecystectomy this admission may not be safest plan  LOS: 10 days    [facility] 06/11/2011

## 2011-06-11 NOTE — Op Note (Addendum)
Cardiac Catheterization Operative Report  [name] [id number] 1/7/20133:23 PM No primary provider on file.  Procedure Performed:  1. Left Heart Catheterization 2. Selective Coronary Angiography 3. Left ventricular angiogram 4. Aortic root angiogram  Operator: Verne Carrow, MD  Indication:  NSTEMI in setting of acute cholecystitis.   Dextrocardia.                            Procedure Details: The risks, benefits, complications, treatment options, and expected outcomes were discussed with the patient. The patient and/or family concurred with the proposed plan, giving informed consent. The patient was brought to the cath lab after IV hydration was begun and oral premedication was given. The patient was further sedated with Versed. The right groin was prepped and draped in the usual manner. Using the modified Seldinger access technique, a 5 French sheath was placed in the right femoral artery. The patient is known to have dextrocardia. A AL-1 catheter was used to engage the native RCA. A JL4.0 catheter was used to engage the left main artery however a 3.5 would probably have fit better. A pigtail catheter was used to perform a left ventricular angiogram and an aortic root angiogram. The sheath was upsized to a 6Fr sheath in anticipation of PCI however after further review of anatomy, I elected to abort the PCI.   There were no immediate complications. The patient was taken to the recovery area in stable condition.   Hemodynamic Findings: Central aortic pressure: 103/62 Left ventricular pressure: 97/5/10  Angiographic Findings:  The patient has dextrocardia.  Left main: Long vessel that gives rise to what appears to be the LAD and a small to moderate sized Circumflex system.   Left Anterior Descending Artery:  Large caliber vessel that does not have a normal distribution of the LAD. There appears to be septal perforating branches and a diagonal branch.  The mid portion of the  vessel has a 90% stenosis.   Circumflex Artery: Small to moderate sized vessel with proximal diffuse 50-60% stenosis.   Right Coronary Artery: Large vessel with 80% mid stenosis. There is a moderate sized vessel that arises from the right coronary cusp and appears to supply at least a portion of the anterior wall. This vessel has a 99% proximal stenosis.   Left Ventricular Angiogram: Anteroapical hypokinesis. LVEF 40-45%.    Impression: 1. Dextrocardia 2. NSTEMI  3. 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  4. 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 She has severe disease in all three of the major vessels. There is a moderate sized anomalous vessel that supplies some of the anterior wall. PCI would require mult-vessel stenting including the anomalous vessel which may course between the great vessels. This vessel would also be incredibly difficult to engage. I will stop for today. I will consult CT surgery for an opinion on her anatomy and an opinion on possible surgical revascularization.         Complications:  None. The patient tolerated the procedure well.          Addendum: Discussed with family. Will check creatinine in am and if stable,will get coronary CTA to exclude malignant course of anomalous vessel. CT surgery consult. CABG vs multi-vessel PCI.   MCALHANY,CHRISTOPHER 4:41 PM 06/11/2011

## 2011-06-12 ENCOUNTER — Other Ambulatory Visit: Payer: Self-pay

## 2011-06-12 ENCOUNTER — Inpatient Hospital Stay (HOSPITAL_COMMUNITY): Payer: Medicare Other

## 2011-06-12 DIAGNOSIS — I2510 Atherosclerotic heart disease of native coronary artery without angina pectoris: Secondary | ICD-10-CM

## 2011-06-12 DIAGNOSIS — Z01810 Encounter for preprocedural cardiovascular examination: Secondary | ICD-10-CM

## 2011-06-12 LAB — COMPREHENSIVE METABOLIC PANEL
ALT: 16 U/L (ref 0–35)
CO2: 27 mEq/L (ref 19–32)
Calcium: 8.6 mg/dL (ref 8.4–10.5)
Creatinine, Ser: 1.16 mg/dL — ABNORMAL HIGH (ref 0.50–1.10)
GFR calc Af Amer: 54 mL/min — ABNORMAL LOW (ref >90)
GFR calc non Af Amer: 47 mL/min — ABNORMAL LOW (ref >90)
Glucose, Bld: 87 mg/dL (ref 70–99)
Sodium: 134 mEq/L — ABNORMAL LOW (ref 135–145)
Total Protein: 6 g/dL (ref 6.0–8.3)

## 2011-06-12 LAB — GLUCOSE, CAPILLARY: Glucose-Capillary: 216 mg/dL — ABNORMAL HIGH (ref 70–99)

## 2011-06-12 LAB — CBC
HCT: 39.6 % (ref 36.0–46.0)
Hemoglobin: 12.6 g/dL (ref 12.0–15.0)
MCH: 29.7 pg (ref 26.0–34.0)
MCHC: 31.8 g/dL (ref 30.0–36.0)
MCV: 93.4 fL (ref 78.0–100.0)
RDW: 15 % (ref 11.5–15.5)

## 2011-06-12 MED ORDER — METOPROLOL TARTRATE 50 MG PO TABS
50.0000 mg | ORAL_TABLET | Freq: Once | ORAL | Status: DC
  Filled 2011-06-12: qty 1

## 2011-06-12 MED ORDER — DIAZEPAM 5 MG PO TABS: 10.0000 mg | ORAL_TABLET | Freq: Four times a day (QID) | ORAL | Status: DC | PRN

## 2011-06-12 MED ORDER — METOPROLOL TARTRATE 50 MG PO TABS
50.0000 mg | ORAL_TABLET | Freq: Once | ORAL | Status: AC
  Administered 2011-06-12: 50 mg via ORAL
  Filled 2011-06-12: qty 1

## 2011-06-12 MED ORDER — PREDNISONE 5 MG PO TABS
5.0000 mg | ORAL_TABLET | Freq: Every day | ORAL | Status: DC
  Administered 2011-06-13 – 2011-06-26 (×12): 5 mg via ORAL
  Filled 2011-06-12 (×18): qty 1

## 2011-06-12 MED ORDER — METOPROLOL TARTRATE 25 MG PO TABS
25.0000 mg | ORAL_TABLET | Freq: Once | ORAL | Status: AC
  Administered 2011-06-12: 25 mg via ORAL

## 2011-06-12 MED ORDER — IOHEXOL 350 MG/ML SOLN
80.0000 mL | Freq: Once | INTRAVENOUS | Status: AC | PRN
  Administered 2011-06-12: 80 mL via INTRAVENOUS

## 2011-06-12 MED FILL — Dextrose Inj 5%: INTRAVENOUS | Qty: 50 | Status: AC

## 2011-06-12 NOTE — Progress Notes (Signed)
SUBJECTIVE: No chest pain or SOB this am.   BP 86/56  Pulse 19  Temp(Src) 98.1 F (36.7 C) (Oral)  Resp 18  Ht 5\' 6"  (1.676 m)  Wt 224 lb 13.9 oz (102 kg)  BMI 36.29 kg/m2  SpO2 93%  Intake/Output Summary (Last 24 hours) at 06/12/11 0850 Last data filed at 06/12/11 0405  Gross per 24 hour  Intake 4510.33 ml  Output   3825 ml  Net 685.33 ml    PHYSICAL EXAM General: Well developed, well nourished, in no acute distress. Alert and oriented x 3.  Psych:  Good affect, responds appropriately Neck: No JVD. No masses noted.  Lungs: Clear bilaterally with no wheezes or rhonci noted.  Heart: RRR with no murmurs noted. Abdomen: Bowel sounds are present. Soft, non-tender.  Extremities: No lower extremity edema.   LABS: Basic Metabolic Panel:  Basename 06/12/11 0500 06/11/11 0400  NA 134* 132*  K 4.4 3.9  CL 97 93*  CO2 27 32  GLUCOSE 87 136*  BUN 20 23  CREATININE 1.16* 1.37*  CALCIUM 8.6 8.8  MG -- --  PHOS -- --   CBC:  Basename 06/11/11 0400 06/10/11 0350  WBC 14.9* 13.4*  NEUTROABS -- --  HGB 12.7 12.6  HCT 38.8 38.5  MCV 93.0 92.8  PLT 222 248    Current Meds:    . albuterol  2.5 mg Nebulization Q6H  . aspirin EC  81 mg Oral Daily  . bivalirudin      . captopril  6.25 mg Oral BID  . ciprofloxacin  400 mg Intravenous Q12H  . clopidogrel      . diazepam  5 mg Oral On Call  . heparin      . insulin aspart  0-15 Units Subcutaneous Q4H  . insulin glargine  5 Units Subcutaneous QHS  . ipratropium  0.5 mg Nebulization Q6H  . lidocaine      . metoprolol tartrate  12.5 mg Oral BID  . metronidazole  500 mg Intravenous Q6H  . midazolam      . nitroGLYCERIN      . pantoprazole  40 mg Oral QHS  . predniSONE  10 mg Oral Q breakfast  . DISCONTD: heparin subcutaneous  5,000 Units Subcutaneous Q8H  . DISCONTD: sodium chloride  1,000 mL Intravenous Once  . DISCONTD: sodium chloride  3 mL Intravenous Q12H     ASSESSMENT AND PLAN:  1. CAD/NSTEMI: In setting of  GB dzs and hypotension. No c/p. Cath yesterday with 4 vessel disease. She has dextrocardia which makes it difficult to find catheters to fit well. She also has a large anomalous vessel that  arises from the RCA ostium and courses to the anterior/anterolateral wall. Cardiac CT today to outline course of anomalous vessel. CT surgery consult to discuss CABG vs multi-vessel PCI. Cont asa, BB  Add Statin when ok with GI/surgery (has had elevated LFT's - improving).   2. ICM: EF 45% by echo and cath.  Volume looks good. Off lasix. D/C Ace-inh with hypotension.    3. Cholecystitis- choledocholethiasis/ cholangitis: s/p ERCP, sphincterotomy, CBD stone extraction. Plan for cholecystectomy following cardiac w/u.   4. DM: Per primary team.    Kemar Pandit  1/8/20138:50 AM

## 2011-06-12 NOTE — Progress Notes (Signed)
Noted pt to have junctional rhythm, 12 lead confirmed junctional tachycardia with hr 80-90bpm,  Pt is asymptomatic, vss and pt denies any pain. Kirby K Np notified. Shawne Bulow Nnedimma  

## 2011-06-12 NOTE — Progress Notes (Signed)
1 Day Post-Op  Subjective: No ab pain  Objective: Vital signs in last 24 hours: Temp:  [97.7 F (36.5 C)-99.1 F (37.3 C)] 98.1 F (36.7 C) (01/08 0740) Pulse Rate:  [19-100] 19  (01/08 0740) Resp:  [15-22] 18  (01/08 0740) BP: (73-127)/(22-78) 86/56 mmHg (01/08 0740) SpO2:  [93 %-98 %] 93 % (01/08 0740) Weight:  [224 lb 13.9 oz (102 kg)] 224 lb 13.9 oz (102 kg) (01/08 0403) Last BM Date: 06/09/11  Intake/Output from previous day: 01/07 0701 - 01/08 0700 In: 4510.3 [P.O.:600; I.V.:1210.3; IV Piggyback:2700] Out: 4725 [Urine:4725] Intake/Output this shift:      Lab Results:   Basename 06/11/11 0400 06/10/11 0350  WBC 14.9* 13.4*  HGB 12.7 12.6  HCT 38.8 38.5  PLT 222 248   BMET  Basename 06/12/11 0500 06/11/11 0400  NA 134* 132*  K 4.4 3.9  CL 97 93*  CO2 27 32  GLUCOSE 87 136*  BUN 20 23  CREATININE 1.16* 1.37*  CALCIUM 8.6 8.8   PT/INR  Basename 06/11/11 0400  LABPROT 14.9  INR 1.15   ABG No results found for this basename: PHART:2,PCO2:2,PO2:2,HCO3:2 in the last 72 hours  Studies/Results: No results found.  Anti-infectives: Anti-infectives     Start     Dose/Rate Route Frequency Ordered Stop   06/05/11 1300   ciprofloxacin (CIPRO) IVPB 400 mg     Comments: pharamcy dose      400 mg 200 mL/hr over 60 Minutes Intravenous Every 12 hours 06/05/11 1130     06/05/11 1300   metroNIDAZOLE (FLAGYL) IVPB 500 mg        500 mg 100 mL/hr over 60 Minutes Intravenous 4 times per day 06/05/11 1130     06/04/11 1200   imipenem-cilastatin (PRIMAXIN) 250 mg in sodium chloride 0.9 % 100 mL IVPB  Status:  Discontinued        250 mg 200 mL/hr over 30 Minutes Intravenous 4 times per day 06/04/11 1031 06/05/11 1130   06/02/11 2200   imipenem-cilastatin (PRIMAXIN) 500 mg in sodium chloride 0.9 % 100 mL IVPB  Status:  Discontinued        500 mg 200 mL/hr over 30 Minutes Intravenous 3 times per day 06/02/11 1226 06/04/11 1031   06/02/11 0600   metroNIDAZOLE  (FLAGYL) IVPB 500 mg  Status:  Discontinued        500 mg 100 mL/hr over 60 Minutes Intravenous Every 8 hours 06/02/11 0504 06/02/11 0511   06/02/11 0600   imipenem-cilastatin (PRIMAXIN) 500 mg in sodium chloride 0.9 % 100 mL IVPB  Status:  Discontinued        500 mg 200 mL/hr over 30 Minutes Intravenous 4 times per day 06/02/11 0513 06/02/11 1226   06/02/11 0445   metroNIDAZOLE (FLAGYL) IVPB 500 mg  Status:  Discontinued        500 mg 100 mL/hr over 60 Minutes Intravenous  Once 06/02/11 0431 06/02/11 0432   06/02/11 0430   ertapenem (INVANZ) 1 g in sodium chloride 0.9 % 50 mL IVPB  Status:  Discontinued        1 g 100 mL/hr over 30 Minutes Intravenous Daily 06/02/11 0400 06/02/11 0512   06/01/11 2230   ciprofloxacin (CIPRO) IVPB 400 mg        400 mg 200 mL/hr over 60 Minutes Intravenous  Once 06/01/11 2215 06/02/11 0015   06/01/11 2230   metroNIDAZOLE (FLAGYL) IVPB 500 mg  Status:  Discontinued  500 mg 100 mL/hr over 60 Minutes Intravenous Every 8 hours 06/01/11 2215 06/02/11 0402          Assessment/Plan: s/p Procedure(s): LEFT HEART CATHETERIZATION WITH CORONARY ANGIOGRAM ARCH AORTOGRAM  History cholangitis s/p ercp/sphincterotomy now with severe 4v cad She will need cholecystectomy at some point but will await cardiac plan   LOS: 11 days    [facility] 06/12/2011

## 2011-06-12 NOTE — Progress Notes (Signed)
Subjective: Chart reviewed, 71 y/o f admitted with abdominal pain, SIRS, found to have choledocholethiasis/ cholangitis.s/p ERCP, sphincterotomy, CBD stone extraction.Now plan for cholecystectomy during this admission, once she is medically stable. Patient had a Myoview which was abnormal cardiac cath was planned by cardiology. 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 Cardiology is planning to do cardiac CT to outline the course of coronary vessels. CT surgery has been consulted by cardiology to discuss CABG versus multivessel PCI. Cholecystectomy after patient is medically stable.  Patient today has no complaints. Denies nausea vomiting or abdominal pain   Objective: Vital signs in last 24 hours: Temp:  [97.4 F (36.3 C)-99.1 F (37.3 C)] 97.4 F (36.3 C) (01/08 1602) Pulse Rate:  [19-97] 69  (01/08 1602) Resp:  [15-22] 18  (01/08 1602) BP: (73-127)/(22-78) 100/54 mmHg (01/08 1602) SpO2:  [91 %-98 %] 96 % (01/08 1602) Weight:  [102 kg (224 lb 13.9 oz)] 224 lb 13.9 oz (102 kg) (01/08 0403) Weight change: 1.5 kg (3 lb 4.9 oz) Last BM Date: 06/09/11  Intake/Output from previous day: 01/07 0701 - 01/08 0700 In: 4510.3 [P.O.:600; I.V.:1210.3; IV Piggyback:2700] Out: 4725 [Urine:4725] Total I/O In: 900 [P.O.:900] Out: 1400 [Urine:1400]   Physical Exam: General: Alert, awake, oriented x3, in no acute distress. HEENT: No bruits, no goiter. Heart: Regular rate and rhythm, without murmurs, rubs, gallops. Lungs: Clear to auscultation bilaterally. Abdomen: Soft, nontender, nondistended, positive bowel sounds. Extremities: No clubbing cyanosis or edema with positive pedal pulses. RUE: Right elbow in splint. Neuro: Grossly intact, nonfocal.    Lab Results: Basic Metabolic Panel:  Basename 06/12/11 0500 06/11/11 0400  NA 134* 132*  K 4.4 3.9  CL 97 93*  CO2 27 32  GLUCOSE 87 136*  BUN 20 23  CREATININE 1.16* 1.37*  CALCIUM 8.6 8.8  MG -- --  PHOS -- --    Liver Function Tests:  Basename 06/12/11 0500 06/11/11 0400  AST 22 17  ALT 16 17  ALKPHOS 115 129*  BILITOT 0.8 1.0  PROT 6.0 6.3  ALBUMIN 2.4* 2.6*   CBC:  Basename 06/12/11 0851 06/11/11 0400  WBC 19.7* 14.9*  NEUTROABS -- --  HGB 12.6 12.7  HCT 39.6 38.8  MCV 93.4 93.0  PLT 238 222   CBG:  Basename 06/12/11 1605 06/12/11 1212 06/12/11 0808 06/12/11 0401 06/11/11 2348 06/11/11 2002  GLUCAP 108* 234* 188* 103* 86 266*    Recent Results (from the past 240 hour(s))  CULTURE, SPUTUM-ASSESSMENT     Status: Normal   Collection Time   06/02/11 11:05 PM      Component Value Range Status Comment   Specimen Description SPUTUM   Final    Special Requests NONE   Final    Sputum evaluation     Final    Value: MICROSCOPIC FINDINGS SUGGEST THAT THIS SPECIMEN IS NOT REPRESENTATIVE OF LOWER RESPIRATORY SECRETIONS. PLEASE RECOLLECT.     CALLED TO Lianne Bushy RN AT 2325 ON [id number]   Report Status 06/02/2011 FINAL   Final     Studies/Results: Ct Heart Morp W/cta Cor W/score W/ca W/cm &/or Wo/cm  06/12/2011  *RADIOLOGY REPORT*  INDICATION:  Recent cardiac catheterization.  Dextrocardia with anomalous cardiac anatomy.  Now for further characterization.  CT ANGIOGRAPHY OF THE HEART, CORONARY ARTERY, STRUCTURE, AND MORPHOLOGY  CONTRAST: 80mL OMNIPAQUE IOHEXOL 350 MG/ML IV SOLN  COMPARISON:  CT of the abdomen and pelvis 06/01/2011  TECHNIQUE:  CT angiography of the coronary vessels was performed  on a 256 channel system using prospective ECG gating.  A scout and noncontrast exam (for calcium scoring) were performed.  Circulation time was measured using a test bolus.  Coronary CTA was performed with sub mm slice collimation during portions of the cardiac cycle after prior injection of iodinated contrast.  Imaging post processing was performed on an independent workstation creating multiplanar and 3-D images, and quantitative analysis of the heart and coronary arteries.  Note that this exam targets  the heart and the chest was not imaged in its entirety.  PREMEDICATION: Lopressor 75 mg, P.O.  FINDINGS: Technical quality:  Adequate for the evaluation of anatomy.  Not adequate for evaluation for intrinsic disease due to irregular, elevated heart rate.  Heart rate:  75  CORONARY ARTERIES:  There is situs inversus.  The SVC and the IVC pass into the anatomic right atrium as expected.  There is atrioventricular concordance on both the right and the left side of the heart. There is ventriculo-arterial concordance on both size of the heart as well.  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  At the aortic root, there is an anterior cusp which normally would be the right coronary cusp.  This will be referred report as the anterior cusp.  There is a posterior cusp which normally would be considered the left cusp.  This will be referred to as the posterior cusp.  There is a left cusp in the atrioventricular groove which will be considered the noncoronary cusp.  There is a single vessel arising from the anterior cusp which would normally be referred to as the right coronary artery.  This gives rise to a proximal branch which passes anteriorly in a pre-pulmonic course and continues in the interventricular groove in the typical distribution of the distal left anterior descending artery.  The right coronary artery continues and gives off an SA nodal branch and septal perforator branches.  The vessel continues to give rise to the acute marginal, posterolateral left ventricular branch, and posterior descending artery (right dominant).  Arising from the posterior sinus is a branch which would normally be considered the left main coronary artery.  This gives rise to a large branch which appears to continue as a large first diagonal branch as well as a smaller left circumflex branch.  Septal perforator branches also arise from the proximal portion of the large first diagonal branch.  CORONARY CALCIUM: Total Agatston  Score:  82.95 MESA database percentile:  74%  CARDIAC MEASUREMENTS: Interventricular septum (6 - 12 mm):  11 mm LV posterior wall (6 - 12 mm):  10 mm LV diameter in diastole (35 - 52 mm):  37 mm  AORTA AND PULMONARY MEASUREMENTS: Aortic root (21 - 40 mm):             22 mm  at the annulus             33 mm  at the sinuses of Valsalva             25 mm  at the sinotubular junction Ascending aorta ( <  40 mm):  31 mm Descending aorta ( <  40 mm):  20 mm Main pulmonary artery:  ( <  30 mm):  23 mm  EXTRACARDIAC FINDINGS: Situs inversus also noted in the abdomen with a left sided liver. There are small bilateral pleural effusions, left slightly greater than right.  Atelectasis noted in both lower lobes.  IMPRESSION:  1. Situs inversus totalis.  What would typically  be called to the right coronary artery arises from the anterior sinus of Valsalva. There is a proximal branch coursing in a pre pulmonic position which continues in the typical distal LAD distribution. Wall would typically be considered the left main coronary artery arises from the posterior sinus of Valsalva and gives rise to a large first diagonal branch and diminutive circumflex.  The patient's total coronary artery calcium score is 82.95, which is 74 percentile for patient's matched age and gender. 2.  Small bilateral pleural effusions with bibasilar atelectasis. 3.  Right coronary artery dominance.  Report was called to CDU mid level at [phone] at  on .  Original Report Authenticated By: Cyndie Chime, M.D.    Medications: Scheduled Meds:    . albuterol  2.5 mg Nebulization Q6H  . aspirin EC  81 mg Oral Daily  . ciprofloxacin  400 mg Intravenous Q12H  . insulin aspart  0-15 Units Subcutaneous Q4H  . insulin glargine  5 Units Subcutaneous QHS  . ipratropium  0.5 mg Nebulization Q6H  . metoprolol tartrate  50 mg Oral Once  . metoprolol tartrate  12.5 mg Oral BID  . metoprolol tartrate  25 mg Oral Once  . metronidazole  500 mg Intravenous  Q6H  . pantoprazole  40 mg Oral QHS  . predniSONE  10 mg Oral Q breakfast  . DISCONTD: captopril  6.25 mg Oral BID  . DISCONTD: heparin subcutaneous  5,000 Units Subcutaneous Q8H  . DISCONTD: metoprolol tartrate  50 mg Oral Once  . DISCONTD: sodium chloride  1,000 mL Intravenous Once  . DISCONTD: sodium chloride  3 mL Intravenous Q12H   Continuous Infusions:    . sodium chloride 20 mL/hr at 06/11/11 0409  . sodium chloride 75 mL/hr at 06/11/11 1558   PRN Meds:.acetaminophen, albuterol, diazepam, iohexol, LORazepam, ondansetron, DISCONTD: sodium chloride, DISCONTD: sodium chloride  Assessment/Plan:  SIRS/Sepstic shock: Resolved.   Cholangitis: Improving, on clear liquid diet. GI and surgery following. LFT's are normal. Cont Cipro and Flagyl. WBC elevated today Check CBC in the morning  CAD Patient has 4 vessel CAD,  Cardiac CT today to outline the course of coronary vessels,. Further plan as per cardiology  CHF: Pt started on diuresis with lasix. Improvement in shortness of breath. Cardiology following  Rheumatoid arthritis Continue prednisone, will change the prednisone to home dose  of 5 mg by mouth daily   S/P VDRF: Stable  AKI: Sec to septic shock Resolving. Cont to monitor.  Anemia: H&H is stable.  Fractured right elbow: Immobilized for comfort Cont splint. PT consulted for ROM F/u Ortho as outpatient.    Dextrocardia: Cardiology aware.  Hyperglycemia:  BG Stable. Continue SSI  DVT Prophylaxis: Heparin     LOS: 11 days   Journii Nierman S [facility] Pager: [phone] 06/12/2011, 4:23 PM

## 2011-06-12 NOTE — Progress Notes (Addendum)
VASCULAR LAB PRELIMINARY  PRELIMINARY  PRELIMINARY  PRELIMINARY  Pre-op Cardiac Surgery  Carotid Findings:  right:  No evidence of hemodynamically significant internal carotid artery stenosis.  left:  60-79% low to mid range internal carotid artery stenosis.  Right:  Vertebral artery  Not insonated.  Left: Vertebral artery flow is antegrade.    Upper Extremity Right Left  Brachial Pressures  110 Triphasic  Radial Waveforms  Triphasic  Ulnar Waveforms  Triphasic  Palmar Arch (Allen's Test)  *   Findings:  Right Palmar arch not evaluated due to full arm splint for fractured elbow. Left palmar arch - Doppler waveforms remained normal with both radial and ulnar compressions    Lower  Extremity Right Left  Dorsalis Pedis    Anterior Tibial    Posterior Tibial    Ankle/Brachial Indices      Findings:  Pedal pulses are palpable bilaterally   Kathleen, [place] D, 06/12/2011, 4:40 PM

## 2011-06-12 NOTE — Progress Notes (Signed)
Physical Therapy Treatment Patient Details Name: [name] MRN: [id number] DOB: 1940-10-30 Today's Date: 06/12/2011  PT Assessment/Plan  PT - Assessment/Plan Comments on Treatment Session: Pt presents s/p heart cath 06/11/11 with this being her first time OOB since procedure. Mostly affected by fatigue and anxiety today. Will progress well.  PT Plan: Discharge plan remains appropriate Equipment Recommended: Defer to next venue PT Goals  Acute Rehab PT Goals PT Goal: Supine/Side to Sit - Progress: Progressing toward goal PT Goal: Sit to Supine/Side - Progress: Progressing toward goal PT Goal: Sit to Stand - Progress: Progressing toward goal PT Goal: Stand to Sit - Progress: Progressing toward goal PT Transfer Goal: Bed to Chair/Chair to Bed - Progress: Progressing toward goal PT Goal: Stand - Progress: Progressing toward goal PT Goal: Ambulate - Progress: Progressing toward goal PT Goal: Up/Down Stairs - Progress: Not met PT Goal: Perform Home Exercise Program - Progress: Not met  PT Treatment Precautions/Restrictions  Precautions Precautions: Fall Restrictions Weight Bearing Restrictions: Yes RUE Weight Bearing:  (avoid WB through Rt elbow) Mobility (including Balance) Bed Mobility Supine to Sit: 3: Mod assist Supine to Sit Details (indicate cue type and reason): mod faciliation to bring trunk upright without weightbearing through RUE; elbow in spint Sitting - Scoot to Edge of Bed: 4: Min assist Transfers Sit to Stand: 1: +2 Total assist (70%) Sit to Stand Details (indicate cue type and reason): faciliation at hips for extension and to assume upright standing Stand to Sit: 1: +2 Total assist (60%) Stand to Sit Details: more assist to control descent; pt with difficulty sequencing stand->sit Ambulation/Gait Ambulation/Gait: Yes Ambulation/Gait Assistance: 1: +2 Total assist Ambulation/Gait Assistance Details (indicate cue type and reason): bilateral support to steady pt  during gait; very slow gait with shuffled steps; pt needing cueing for more controlled breathing; appears more anxious during gait Ambulation Distance (Feet): 10 Feet Assistive device: 1 person hand held assist (hand held assist on left)  Posture/Postural Control Posture/Postural Control: No significant limitations Static Standing Balance Static Standing - Level of Assistance: 1: +2 Total assist Static Standing - Comment/# of Minutes: +2totalpt75% Exercise  Total Joint Exercises Ankle Circles/Pumps: AROM;20 reps;Both;Supine End of Session PT - End of Session Equipment Utilized During Treatment: Gait belt Activity Tolerance: Patient limited by fatigue Patient left: in chair;with call bell in reach;with family/visitor present Nurse Communication: Mobility status for transfers;Mobility status for ambulation General Behavior During Session:  (anxious) Cognition: WFL for tasks performed  [facility] HELEN 06/12/2011, 12:13 PM

## 2011-06-12 NOTE — Consult Note (Signed)
[address]            [place] [address]          [phone]      Reason for Consult: High-grade two-vessel coronary disease status post non-ST segment elevation MI Referring Physician: Verne Carrow, MD  [name] is an 71 y.o. female.  HPI:   The patient presented to the emergency department at the end of December with fever to 102, chills, nausea, vomiting, and upper abdominal pain. She was found to have leukocytosis and elevated liver function tests. Ultrasound showed gallstones without cholecystitis, a prominent common bile duct, and a CT scan showed inflammation around the head of the pancreas consistent with mild pancreatitis. She was apparently hyportensive at the time of presentation and required fluid resuscitation. She was diagnosed with cholelithiasis and underwent ERCP with sphincterotomy and stone extraction. She did have shortness of breath and what appeared to be pulmonary edema on chest x-ray and cardiac enzymes were positive. She ruled in for a non-ST segment elevation MI. A pharmacologic stress test was abnormal showing dilatation of the left ventricle and left ventricular dysfunction with ejection fraction of 20%. 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 There is probable apical infarct with possible area of reversibility involving the distal anterolateral wall concerning for ischemia. She subsequently underwent cardiac catheterization which confirmed dextrocardia. The left main coronary artery appeared to have no disease. It appeared to divide into an LAD and a small to moderate sized left circumflex system. The LAD was a large caliber vessel that did not have the normal distribution of the LAD and had about 90% mid vessel stenosis. Left circumflex was a small to moderate size vessel with 50-60% diffuse proximal stenosis. The right coronary was large vessel with 80% mid  vessel stenosis. There is also a moderate size vessel that arises from the right coronary cusp and supply at least a portion of the anterior wall. This had a 99% proximal stenosis. Left ventricular ejection fraction was estimated at 40-45%. She subsequently had a CT angiogram of the heart to better evaluate the morphology of the coronary arteries. At the present time she has no chest pain or shortness of breath. She is taking some clear liquids and does report occasional episodes of nausea.   Past Medical History  Diagnosis Date  . Arthritis   . Neuropathy   . Reflux   . Hypertension   . Elevated cholesterol   . Situs inversus   . Dextrocardia   Blood clot in vein left upper leg in past treated with coumadin  Past Surgical History  Procedure Date  . Elbow fracture surgery   . Vaginal hysterectomy   . Ercp 06/03/2011    Procedure: ENDOSCOPIC RETROGRADE CHOLANGIOPANCREATOGRAPHY (ERCP);  Surgeon: Petra Kuba, MD;  Location: [facility] OR;  Service: Endoscopy;  Laterality: N/A;    History reviewed. No pertinent family history.  Social History:  reports that she has never smoked. She does not have any smokeless tobacco history on file. She reports that she does not drink alcohol or use illicit drugs.  Allergies:  Allergies  Allergen Reactions  . Penicillins Anaphylaxis    Medications:  Prior to Admission:  Prescriptions prior to admission  Medication Sig Dispense Refill  . allopurinol (ZYLOPRIM) 100 MG tablet Take 200 mg by mouth daily.        Marland Kitchen  aspirin 325 MG tablet Take 325 mg by mouth daily.        Marland Kitchen gabapentin (NEURONTIN) 300 MG capsule Take 300 mg by mouth 2 (two) times daily.        Marland Kitchen HYDROcodone-acetaminophen (VICODIN) 5-500 MG per tablet Take 1 tablet by mouth every 4 (four) hours as needed. For pain.       Marland Kitchen lisinopril-hydrochlorothiazide (PRINZIDE,ZESTORETIC) 20-25 MG per tablet Take 1 tablet by mouth every morning.        Marland Kitchen omeprazole (PRILOSEC) 20 MG capsule Take 20 mg by  mouth every morning.        . predniSONE (DELTASONE) 5 MG tablet Take 5 mg by mouth daily.        . simvastatin (ZOCOR) 40 MG tablet Take 40 mg by mouth at bedtime.         Scheduled:   . albuterol  2.5 mg Nebulization Q6H  . aspirin EC  81 mg Oral Daily  . ciprofloxacin  400 mg Intravenous Q12H  . insulin aspart  0-15 Units Subcutaneous Q4H  . insulin glargine  5 Units Subcutaneous QHS  . ipratropium  0.5 mg Nebulization Q6H  . metoprolol tartrate  50 mg Oral Once  . metoprolol tartrate  12.5 mg Oral BID  . metoprolol tartrate  25 mg Oral Once  . metronidazole  500 mg Intravenous Q6H  . pantoprazole  40 mg Oral QHS  . predniSONE  5 mg Oral Q breakfast  . DISCONTD: captopril  6.25 mg Oral BID  . DISCONTD: metoprolol tartrate  50 mg Oral Once  . DISCONTD: predniSONE  10 mg Oral Q breakfast   Continuous:   . sodium chloride 20 mL/hr at 06/11/11 0409   ZOX:[id number], albuterol, diazepam, iohexol, LORazepam, ondansetron  Results for orders placed during the hospital encounter of 06/01/11 (from the past 48 hour(s))  GLUCOSE, CAPILLARY     Status: Abnormal   Collection Time   06/10/11  8:11 PM      Component Value Range Comment   Glucose-Capillary 171 (*) 70 - 99 (mg/dL)    Comment 1 Documented in Chart      Comment 2 Notify RN     GLUCOSE, CAPILLARY     Status: Abnormal   Collection Time   06/11/11 12:33 AM      Component Value Range Comment   Glucose-Capillary 136 (*) 70 - 99 (mg/dL)   CBC     Status: Abnormal   Collection Time   06/11/11  4:00 AM      Component Value Range Comment   WBC 14.9 (*) 4.0 - 10.5 (K/uL)    RBC 4.17  3.87 - 5.11 (MIL/uL)    Hemoglobin 12.7  12.0 - 15.0 (g/dL)    HCT 14.7  82.9 - 56.2 (%)    MCV 93.0  78.0 - 100.0 (fL)    MCH 30.5  26.0 - 34.0 (pg)    MCHC 32.7  30.0 - 36.0 (g/dL)    RDW 13.0  86.5 - 78.4 (%)    Platelets 222  150 - 400 (K/uL)   COMPREHENSIVE METABOLIC PANEL     Status: Abnormal   Collection Time   06/11/11  4:00 AM       Component Value Range Comment   Sodium 132 (*) 135 - 145 (mEq/L)    Potassium 3.9  3.5 - 5.1 (mEq/L)    Chloride 93 (*) 96 - 112 (mEq/L)    CO2 32  19 - 32 (mEq/L)  Glucose, Bld 136 (*) 70 - 99 (mg/dL)    BUN 23  6 - 23 (mg/dL)    Creatinine, Ser 4.09 (*) 0.50 - 1.10 (mg/dL)    Calcium 8.8  8.4 - 10.5 (mg/dL)    Total Protein 6.3  6.0 - 8.3 (g/dL)    Albumin 2.6 (*) 3.5 - 5.2 (g/dL)    AST 17  0 - 37 (U/L)    ALT 17  0 - 35 (U/L)    Alkaline Phosphatase 129 (*) 39 - 117 (U/L)    Total Bilirubin 1.0  0.3 - 1.2 (mg/dL)    GFR calc non Af Amer 38 (*) >90 (mL/min)    GFR calc Af Amer 44 (*) >90 (mL/min)   PROTIME-INR     Status: Normal   Collection Time   06/11/11  4:00 AM      Component Value Range Comment   Prothrombin Time 14.9  11.6 - 15.2 (seconds)    INR 1.15  0.00 - 1.49    GLUCOSE, CAPILLARY     Status: Abnormal   Collection Time   06/11/11  4:03 AM      Component Value Range Comment   Glucose-Capillary 134 (*) 70 - 99 (mg/dL)   GLUCOSE, CAPILLARY     Status: Abnormal   Collection Time   06/11/11  8:22 AM      Component Value Range Comment   Glucose-Capillary 139 (*) 70 - 99 (mg/dL)    Comment 1 Notify RN      Comment 2 Documented in Chart     GLUCOSE, CAPILLARY     Status: Abnormal   Collection Time   06/11/11 11:59 AM      Component Value Range Comment   Glucose-Capillary 141 (*) 70 - 99 (mg/dL)    Comment 1 Notify RN      Comment 2 Documented in Chart     GLUCOSE, CAPILLARY     Status: Abnormal   Collection Time   06/11/11  3:44 PM      Component Value Range Comment   Glucose-Capillary 190 (*) 70 - 99 (mg/dL)   GLUCOSE, CAPILLARY     Status: Abnormal   Collection Time   06/11/11  8:02 PM      Component Value Range Comment   Glucose-Capillary 266 (*) 70 - 99 (mg/dL)    Comment 1 Notify RN      Comment 2 Documented in Chart     GLUCOSE, CAPILLARY     Status: Normal   Collection Time   06/11/11 11:48 PM      Component Value Range Comment   Glucose-Capillary 86  70 -  99 (mg/dL)   GLUCOSE, CAPILLARY     Status: Abnormal   Collection Time   06/12/11  4:01 AM      Component Value Range Comment   Glucose-Capillary 103 (*) 70 - 99 (mg/dL)    Comment 1 Notify RN      Comment 2 Documented in Chart     COMPREHENSIVE METABOLIC PANEL     Status: Abnormal   Collection Time   06/12/11  5:00 AM      Component Value Range Comment   Sodium 134 (*) 135 - 145 (mEq/L)    Potassium 4.4  3.5 - 5.1 (mEq/L)    Chloride 97  96 - 112 (mEq/L)    CO2 27  19 - 32 (mEq/L)    Glucose, Bld 87  70 - 99 (mg/dL)    BUN 20  6 - 23 (mg/dL)    Creatinine, Ser 4.09 (*) 0.50 - 1.10 (mg/dL)    Calcium 8.6  8.4 - 10.5 (mg/dL)    Total Protein 6.0  6.0 - 8.3 (g/dL)    Albumin 2.4 (*) 3.5 - 5.2 (g/dL)    AST 22  0 - 37 (U/L) HEMOLYSIS AT THIS LEVEL MAY AFFECT RESULT   ALT 16  0 - 35 (U/L)    Alkaline Phosphatase 115  39 - 117 (U/L)    Total Bilirubin 0.8  0.3 - 1.2 (mg/dL)    GFR calc non Af Amer 47 (*) >90 (mL/min)    GFR calc Af Amer 54 (*) >90 (mL/min)   GLUCOSE, CAPILLARY     Status: Abnormal   Collection Time   06/12/11  8:08 AM      Component Value Range Comment   Glucose-Capillary 188 (*) 70 - 99 (mg/dL)   CBC     Status: Abnormal   Collection Time   06/12/11  8:51 AM      Component Value Range Comment   WBC 19.7 (*) 4.0 - 10.5 (K/uL)    RBC 4.24  3.87 - 5.11 (MIL/uL)    Hemoglobin 12.6  12.0 - 15.0 (g/dL)    HCT 81.1  91.4 - 78.2 (%)    MCV 93.4  78.0 - 100.0 (fL)    MCH 29.7  26.0 - 34.0 (pg)    MCHC 31.8  30.0 - 36.0 (g/dL)    RDW 95.6  21.3 - 08.6 (%)    Platelets 238  150 - 400 (K/uL)   GLUCOSE, CAPILLARY     Status: Abnormal   Collection Time   06/12/11 12:12 PM      Component Value Range Comment   Glucose-Capillary 234 (*) 70 - 99 (mg/dL)   GLUCOSE, CAPILLARY     Status: Abnormal   Collection Time   06/12/11  4:05 PM      Component Value Range Comment   Glucose-Capillary 108 (*) 70 - 99 (mg/dL)     *RADIOLOGY REPORT*   Clinical Data:  Chest pain with elevated  cardiac enzymes and LV dysfunction.  Situs inversus.   MYOCARDIAL IMAGING WITH SPECT (REST AND PHARMACOLOGIC-STRESS) GATED LEFT VENTRICULAR WALL MOTION STUDY LEFT VENTRICULAR EJECTION FRACTION   Technique:  Resting myocardial SPECT imaging was initially performed after intravenous administration of radiopharmaceutical. Myocardial SPECT was subsequently performed after additional radiopharmaceutical injection during pharmacologic-stress (Lexiscan)supervised by the Cardiology staff.  Quantitative gated imaging was also performed to evaluate left ventricular wall motion, and estimate left ventricular ejection fraction.   Radiopharmaceutical:  10 mCi Tc-25m Myoview at rest and 30 mCi during stress.   Comparison: MRCP 06/02/2011.  Abdominal CT 06/01/2011.   Findings:   The source planar images demonstrate prominent subdiaphragmatic activity and situs inversus.  Post processing is affected by the patient's altered anatomy.  The computer generated labeling appears inconsistent.  SPECT images demonstrate decreased activity at the apex.  There appears to be a reversible component involving the distal anterolateral wall.   Gated cine images were reviewed on the workstation and demonstrate apical and septal hypokinesis.  The QGS ejection fraction measured at rest is 28% with an end diastolic volume of 43 ml and an end- systolic volume of 31 ml.   IMPRESSION:   1.  Abnormal study demonstrating dilatation of the left ventricle and left ventricular dysfunction.  Calculated left ventricular ejection fraction is 28%. 2.  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  There is a probable apical infarct with a possible area of reversibility involving the distal anterolateral wall concerning for ischemia.   Original Report Authenticated By: Gerrianne Scale, M.D.   Hemodynamic Findings: Central aortic pressure: 103/62 Left ventricular pressure: 97/5/10  Angiographic  Findings:  The patient has dextrocardia.  Left main: Long vessel that gives rise to what appears to be the LAD and a small to moderate sized Circumflex system.    Left Anterior Descending Artery:  Large caliber vessel that does not have a normal distribution of the LAD. There appears to be septal perforating branches and a diagonal branch.  The mid portion of the vessel has a 90% stenosis.    Circumflex Artery: Small to moderate sized vessel with proximal diffuse 50-60% stenosis.    Right Coronary Artery: Large vessel with 80% mid stenosis. There is a moderate sized vessel that arises from the right coronary cusp and appears to supply at least a portion of the anterior wall. This vessel has a 99% proximal stenosis.   Left Ventricular Angiogram: Anteroapical hypokinesis. LVEF 40-45%.    Impression: 1. Dextrocardia 2. NSTEMI   3. 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   4. Mild LV systolic dysfunction     Ct Heart Morp W/cta Cor W/score W/ca W/cm &/or Wo/cm  06/12/2011  *RADIOLOGY REPORT*  INDICATION:  Recent cardiac catheterization.  Dextrocardia with anomalous cardiac anatomy.  Now for further characterization.  CT ANGIOGRAPHY OF THE HEART, CORONARY ARTERY, STRUCTURE, AND MORPHOLOGY  CONTRAST: 80mL OMNIPAQUE IOHEXOL 350 MG/ML IV SOLN  COMPARISON:  CT of the abdomen and pelvis 06/01/2011  TECHNIQUE:  CT angiography of the coronary vessels was performed on a 256 channel system using prospective ECG gating.  A scout and noncontrast exam (for calcium scoring) were performed.  Circulation time was measured using a test bolus.  Coronary CTA was performed with sub mm slice collimation during portions of the cardiac cycle after prior injection of iodinated contrast.  Imaging post processing was performed on an independent workstation creating multiplanar and 3-D images, and quantitative analysis of the  heart and coronary arteries.  Note that this exam targets the heart and the chest was not imaged in its entirety.  PREMEDICATION: Lopressor 75 mg, P.O.  FINDINGS: Technical quality:  Adequate for the evaluation of anatomy.  Not adequate for evaluation for intrinsic disease due to irregular, elevated heart rate.  Heart rate:  75  CORONARY ARTERIES:  There is situs inversus.  The SVC and the IVC pass into the anatomic right atrium as expected.  There is atrioventricular concordance on both the right and the left side of the heart. There is ventriculo-arterial concordance on both size of the heart as well.  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  At the aortic root, there is an anterior cusp which normally would be the right coronary cusp.  This will be referred report as the anterior cusp.  There is a posterior cusp which normally would be considered the left cusp.  This will be referred to as the posterior cusp.  There is a left cusp in the atrioventricular groove which will be considered the noncoronary cusp.  There is a single vessel arising from the anterior cusp which would normally be referred to as the right coronary artery.  This gives rise to a proximal branch which passes anteriorly in a pre-pulmonic course and continues in  the interventricular groove in the typical distribution of the distal left anterior descending artery.  The right coronary artery continues and gives off an SA nodal branch and septal perforator branches.  The vessel continues to give rise to the acute marginal, posterolateral left ventricular branch, and posterior descending artery (right dominant).  Arising from the posterior sinus is a branch which would normally be considered the left main coronary artery.  This gives rise to a large branch which appears to continue as a large first diagonal branch as well as a smaller left circumflex branch.  Septal perforator branches also arise from the proximal portion of the large  first diagonal branch.  CORONARY CALCIUM: Total Agatston Score:  82.95 MESA database percentile:  74%  CARDIAC MEASUREMENTS: Interventricular septum (6 - 12 mm):  11 mm LV posterior wall (6 - 12 mm):  10 mm LV diameter in diastole (35 - 52 mm):  37 mm  AORTA AND PULMONARY MEASUREMENTS: Aortic root (21 - 40 mm):             22 mm  at the annulus             33 mm  at the sinuses of Valsalva             25 mm  at the sinotubular junction Ascending aorta ( <  40 mm):  31 mm Descending aorta ( <  40 mm):  20 mm Main pulmonary artery:  ( <  30 mm):  23 mm  EXTRACARDIAC FINDINGS: Situs inversus also noted in the abdomen with a left sided liver. There are small bilateral pleural effusions, left slightly greater than right.  Atelectasis noted in both lower lobes.  IMPRESSION:  1. Situs inversus totalis.  What would typically be called to the right coronary artery arises from the anterior sinus of Valsalva. There is a proximal branch coursing in a pre pulmonic position which continues in the typical distal LAD distribution. Wall would typically be considered the left main coronary artery arises from the posterior sinus of Valsalva and gives rise to a large first diagonal branch and diminutive circumflex.  The patient's total coronary artery calcium score is 82.95, which is 74 percentile for patient's matched age and gender. 2.  Small bilateral pleural effusions with bibasilar atelectasis. 3.  Right coronary artery dominance.  Report was called to CDU mid level at [phone] at  on .  Original Report Authenticated By: Cyndie Chime, M.D.    Review of Systems  Constitutional: Positive for malaise/fatigue. Negative for fever, chills and diaphoresis.  Eyes: Negative.   Respiratory: Positive for shortness of breath. Negative for cough and sputum production.   Cardiovascular: Negative for chest pain, palpitations, orthopnea, leg swelling and PND.  Gastrointestinal: Positive for nausea and abdominal pain. Negative for  diarrhea, blood in stool and melena.  Genitourinary: Negative for dysuria, frequency and hematuria.  Musculoskeletal: Positive for joint pain. Negative for myalgias.  Skin: Negative for itching and rash.  Neurological: Positive for weakness. Negative for dizziness, sensory change, focal weakness and headaches.  Endo/Heme/Allergies: Does not bruise/bleed easily.  Psychiatric/Behavioral: Negative.    Blood pressure 100/54, pulse 69, temperature 97.4 F (36.3 C), temperature source Oral, resp. rate 18, height 5\' 6"  (1.676 m), weight 102 kg (224 lb 13.9 oz), SpO2 96.00%. Physical Exam  Constitutional: She is oriented to person, place, and time. She appears well-developed.       Chronically ill-appearing  HENT:  Head: Normocephalic and atraumatic.  Mouth/Throat:  Oropharynx is clear and moist.  Eyes: EOM are normal. Pupils are equal, round, and reactive to light.  Neck: No thyromegaly present.  Cardiovascular: Normal rate, regular rhythm, normal heart sounds and intact distal pulses.  Exam reveals no gallop and no friction rub.   No murmur heard. Respiratory: No respiratory distress. She has rales.       In bases  GI: Bowel sounds are normal. She exhibits no distension and no mass. There is no tenderness. There is no rebound and no guarding.  Musculoskeletal: She exhibits no edema.  Lymphadenopathy:    She has no cervical adenopathy.  Neurological: She is alert and oriented to person, place, and time. She has normal strength. No cranial nerve deficit.  Skin: Skin is warm and dry.  Psychiatric: She has a normal mood and affect.  Extremities: Splint on right arm. No peripheral edema. Pedal pulses palpable.  Assessment/Plan:  She has high-grade two-vessel coronary disease involving the LAD and right coronary territories. One branch of the right coronary artery appears to supply part of the anterior wall and has a high-grade stenosis. She suffered a non-ST segment elevation MI probably related  to hypotension and possible sepsis at the time of presentation. I agree that revascularization is indicated to prevent further ischemia and infarction. This is made more complicated by multiple factors including her dextrocardia, general debilitated state following a long hospitalization for choledocholithiasis and possible cholangitis, rheumatoid arthritis treated with steroids, and chronic renal insufficiency. She would also be at increased risk for postoperative abdominal complications related to her biliary system. Given her current state I think that percutaneous intervention may be a good option if possible, but if it is not possible then I think coronary bypass graft surgery is indicated. I will discuss that tomorrow with Dr. Clifton James. I discussed all this with the patient and her family. I told them I would return tomorrow after I had this chance to discuss it further with cardiology.  Shalona Harbour K 06/12/2011, 7:53 PM

## 2011-06-12 NOTE — Progress Notes (Signed)
Occupational Therapy Treatment Patient Details Name: [name] MRN: [id number] DOB: 12-28-1940 Today's Date: 06/12/2011  OT Assessment/Plan OT Assessment/Plan OT Plan: Discharge plan remains appropriate Recommendations for Other Services: Rehab consult Follow Up Recommendations: Inpatient Rehab Equipment Recommended: Defer to next venue OT Goals ADL Goals ADL Goal: Grooming - Progress: Progressing toward goals ADL Goal: Toilet Transfer - Progress: Progressing toward goals  OT Treatment Precautions/Restrictions  Precautions Precautions: Fall Restrictions Weight Bearing Restrictions: Yes RUE Weight Bearing:  (avoid WB through Rt elbow)   ADL ADL Eating/Feeding: Performed;Set up Eating/Feeding Details (indicate cue type and reason): jello Where Assessed - Eating/Feeding: Chair Toilet Transfer: Simulated;+2 Total assistance (pt=75%) Toilet Transfer Details (indicate cue type and reason): simulated from EOB to chair (pt. with cath and denied need to have BM). Patient easily fatigued and requiring multiple rest breaks for short distance (<5ft).  Toilet Transfer Method: Ambulating Toileting - Clothing Manipulation: Not assessed Toileting - Hygiene: Not assessed Ambulation Related to ADLs: +2 total assist(pt=75%) with bilateral hand-held assist ambulation from EOB to chair. patient required multiple rest breaks due to fatigue/weakness.  ADL Comments: Patient limited today by fatigue (heart cath performed yesterday). O2 remained at 91-92% on room air with ambulation. Replaced 2L [place] O2 at end of session Mobility  Bed Mobility Supine to Sit: 3: Mod assist (Per PT (not witnessed by OT)) Transfers Sit to Stand: 1: +2 Total assist (pt=70%) Sit to Stand Details (indicate cue type and reason): VC for Rt hand placement and assist to achieve standing Stand to Sit: 1: +2 Total assist (pt=60%) Stand to Sit Details: VC for hand placement and assist to conrol descent End of Session OT -  End of Session Equipment Utilized During Treatment: Gait belt Activity Tolerance: Patient limited by fatigue Patient left: in chair;with call bell in reach;with family/visitor present Nurse Communication: Mobility status for transfers;Mobility status for ambulation General Behavior During Session: [facility] for tasks performed Cognition: [facility] for tasks performed  Etrulia Zarr  06/12/2011, 11:06 AM

## 2011-06-13 ENCOUNTER — Encounter (HOSPITAL_COMMUNITY): Payer: Self-pay | Admitting: General Practice

## 2011-06-13 ENCOUNTER — Other Ambulatory Visit: Payer: Self-pay

## 2011-06-13 DIAGNOSIS — J189 Pneumonia, unspecified organism: Secondary | ICD-10-CM

## 2011-06-13 DIAGNOSIS — Z01810 Encounter for preprocedural cardiovascular examination: Secondary | ICD-10-CM

## 2011-06-13 DIAGNOSIS — I2510 Atherosclerotic heart disease of native coronary artery without angina pectoris: Secondary | ICD-10-CM

## 2011-06-13 HISTORY — DX: Pneumonia, unspecified organism: J18.9

## 2011-06-13 LAB — COMPREHENSIVE METABOLIC PANEL
ALT: 13 U/L (ref 0–35)
BUN: 19 mg/dL (ref 6–23)
CO2: 25 mEq/L (ref 19–32)
Calcium: 8.7 mg/dL (ref 8.4–10.5)
Creatinine, Ser: 1.12 mg/dL — ABNORMAL HIGH (ref 0.50–1.10)
GFR calc Af Amer: 57 mL/min — ABNORMAL LOW (ref >90)
GFR calc non Af Amer: 49 mL/min — ABNORMAL LOW (ref >90)
Glucose, Bld: 137 mg/dL — ABNORMAL HIGH (ref 70–99)
Sodium: 133 mEq/L — ABNORMAL LOW (ref 135–145)
Total Protein: 6.2 g/dL (ref 6.0–8.3)

## 2011-06-13 LAB — PREPARE RBC (CROSSMATCH)

## 2011-06-13 LAB — TYPE AND SCREEN

## 2011-06-13 LAB — CBC
HCT: 37.2 % (ref 36.0–46.0)
Hemoglobin: 11.8 g/dL — ABNORMAL LOW (ref 12.0–15.0)
MCH: 29.5 pg (ref 26.0–34.0)
MCHC: 31.7 g/dL (ref 30.0–36.0)
MCV: 93 fL (ref 78.0–100.0)
RBC: 4 MIL/uL (ref 3.87–5.11)

## 2011-06-13 LAB — GLUCOSE, CAPILLARY
Glucose-Capillary: 142 mg/dL — ABNORMAL HIGH (ref 70–99)
Glucose-Capillary: 161 mg/dL — ABNORMAL HIGH (ref 70–99)

## 2011-06-13 LAB — URINALYSIS, ROUTINE W REFLEX MICROSCOPIC
Glucose, UA: NEGATIVE mg/dL
Ketones, ur: NEGATIVE mg/dL
Protein, ur: NEGATIVE mg/dL
Urobilinogen, UA: 1 mg/dL (ref 0.0–1.0)

## 2011-06-13 LAB — URINE MICROSCOPIC-ADD ON

## 2011-06-13 MED ORDER — NITROGLYCERIN IN D5W 200-5 MCG/ML-% IV SOLN: 2.0000 ug/min | INTRAVENOUS | Status: DC

## 2011-06-13 MED ORDER — VANCOMYCIN HCL 1000 MG IV SOLR
1250.0000 mg | INTRAVENOUS | Status: DC
  Filled 2011-06-13: qty 1250

## 2011-06-13 MED ORDER — ALPRAZOLAM 0.25 MG PO TABS: 0.2500 mg | ORAL_TABLET | ORAL | Status: DC | PRN

## 2011-06-13 MED ORDER — EPINEPHRINE HCL 1 MG/ML IJ SOLN
0.5000 ug/min | INTRAVENOUS | Status: DC
  Filled 2011-06-13: qty 4

## 2011-06-13 MED ORDER — LACTATED RINGERS IV SOLN
INTRAVENOUS | Status: DC
  Administered 2011-06-13: 19:00:00 via INTRAVENOUS
  Filled 2011-06-13: qty 1000

## 2011-06-13 MED ORDER — MAGNESIUM SULFATE 50 % IJ SOLN
40.0000 meq | INTRAMUSCULAR | Status: DC
  Filled 2011-06-13: qty 10

## 2011-06-13 MED ORDER — DOPAMINE-DEXTROSE 3.2-5 MG/ML-% IV SOLN: 2.0000 ug/kg/min | INTRAVENOUS | Status: DC

## 2011-06-13 MED ORDER — BISACODYL 5 MG PO TBEC
5.0000 mg | DELAYED_RELEASE_TABLET | Freq: Once | ORAL | Status: AC
  Administered 2011-06-13: 5 mg via ORAL
  Filled 2011-06-13: qty 1

## 2011-06-13 MED ORDER — NITROGLYCERIN IN D5W 200-5 MCG/ML-% IV SOLN
2.0000 ug/min | INTRAVENOUS | Status: DC
  Filled 2011-06-13: qty 250

## 2011-06-13 MED ORDER — POTASSIUM CHLORIDE 2 MEQ/ML IV SOLN
80.0000 meq | INTRAVENOUS | Status: DC
  Filled 2011-06-13: qty 40

## 2011-06-13 MED ORDER — CHLORHEXIDINE GLUCONATE 4 % EX LIQD
60.0000 mL | Freq: Once | CUTANEOUS | Status: AC
  Administered 2011-06-13: 4 via TOPICAL
  Filled 2011-06-13: qty 60

## 2011-06-13 MED ORDER — DOPAMINE-DEXTROSE 3.2-5 MG/ML-% IV SOLN
2.0000 ug/kg/min | INTRAVENOUS | Status: DC
  Filled 2011-06-13: qty 250

## 2011-06-13 MED ORDER — TEMAZEPAM 15 MG PO CAPS: 15.0000 mg | ORAL_CAPSULE | Freq: Once | ORAL | Status: AC | PRN

## 2011-06-13 MED ORDER — SODIUM CHLORIDE 0.9 % IV SOLN
0.1000 ug/kg/h | INTRAVENOUS | Status: DC
  Filled 2011-06-13: qty 4

## 2011-06-13 MED ORDER — AMINOCAPROIC ACID 250 MG/ML IV SOLN
INTRAVENOUS | Status: DC
  Filled 2011-06-13: qty 40

## 2011-06-13 MED ORDER — MOXIFLOXACIN HCL IN NACL 400 MG/250ML IV SOLN
400.0000 mg | INTRAVENOUS | Status: DC
  Filled 2011-06-13: qty 250

## 2011-06-13 MED ORDER — SODIUM CHLORIDE 0.9 % IV SOLN
INTRAVENOUS | Status: DC
  Filled 2011-06-13: qty 40

## 2011-06-13 MED ORDER — PLASMA-LYTE 148 IV SOLN
INTRAVENOUS | Status: DC
  Filled 2011-06-13: qty 0.5

## 2011-06-13 MED ORDER — PHENYLEPHRINE HCL 10 MG/ML IJ SOLN
30.0000 ug/min | INTRAVENOUS | Status: DC
  Filled 2011-06-13: qty 2

## 2011-06-13 MED ORDER — PLASMA-LYTE 148 IV SOLN
INTRAVENOUS | Status: AC
  Administered 2011-06-14: 09:00:00
  Filled 2011-06-13: qty 0.5

## 2011-06-13 MED ORDER — VANCOMYCIN HCL 1000 MG IV SOLR
1500.0000 mg | INTRAVENOUS | Status: AC
  Administered 2011-06-14: 1500 mg via INTRAVENOUS
  Filled 2011-06-13: qty 1500

## 2011-06-13 MED ORDER — CHLORHEXIDINE GLUCONATE 4 % EX LIQD: 60.0000 mL | Freq: Once | CUTANEOUS | Status: DC

## 2011-06-13 MED ORDER — FENTANYL CITRATE 0.05 MG/ML IJ SOLN: 50.0000 ug | INTRAMUSCULAR | Status: DC | PRN

## 2011-06-13 MED ORDER — SODIUM CHLORIDE 0.9 % IV SOLN
INTRAVENOUS | Status: DC
  Filled 2011-06-13: qty 1

## 2011-06-13 MED ORDER — METOPROLOL TARTRATE 12.5 MG HALF TABLET
12.5000 mg | ORAL_TABLET | Freq: Once | ORAL | Status: DC
  Filled 2011-06-13: qty 1

## 2011-06-13 MED ORDER — CHLORHEXIDINE GLUCONATE 4 % EX LIQD
60.0000 mL | Freq: Once | CUTANEOUS | Status: AC
  Administered 2011-06-14: 4 via TOPICAL
  Filled 2011-06-13: qty 60

## 2011-06-13 MED ORDER — ~~LOC~~ CARDIAC SURGERY, PATIENT & FAMILY EDUCATION
Freq: Once | Status: AC
  Administered 2011-06-13: 22:00:00
  Filled 2011-06-13: qty 1

## 2011-06-13 MED ORDER — MOXIFLOXACIN HCL IN NACL 400 MG/250ML IV SOLN
400.0000 mg | INTRAVENOUS | Status: AC
  Administered 2011-06-14: 400 mg via INTRAVENOUS
  Filled 2011-06-13: qty 250

## 2011-06-13 MED ORDER — MIDAZOLAM HCL 2 MG/2ML IJ SOLN: 1.0000 mg | INTRAMUSCULAR | Status: DC | PRN

## 2011-06-13 MED ORDER — DEXTROSE 5 % IV SOLN
0.5000 ug/min | INTRAVENOUS | Status: DC
  Filled 2011-06-13: qty 4

## 2011-06-13 NOTE — Progress Notes (Signed)
Subjective: Chart reviewed, 71 y/o f admitted with abdominal pain, SIRS, found to have choledocholethiasis/ cholangitis.s/p ERCP, sphincterotomy, CBD stone extraction.Now plan for cholecystectomy during this admission, once she is medically stable. Patient had a Myoview which was abnormal cardiac cath was planned by cardiology. 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 Cardiology is planning to do cardiac CT to outline the course of coronary vessels. Dr. Laneta Simmers to perform CABG 06/14/11. Cholecystectomy after patient is medically stable.  Patient today has no complaints. Denies nausea vomiting or abdominal pain   Objective: Vital signs in last 24 hours: Temp:  [97.3 F (36.3 C)-98.6 F (37 C)] 97.4 F (36.3 C) (01/09 1500) Pulse Rate:  [79-92] 91  (01/09 1500) Resp:  [16-20] 18  (01/09 1500) BP: (96-105)/(54-64) 105/57 mmHg (01/09 1500) SpO2:  [93 %-100 %] 97 % (01/09 1500) Weight:  [98.3 kg (216 lb 11.4 oz)] 98.3 kg (216 lb 11.4 oz) (01/09 0600) Weight change: -3.7 kg (-8 lb 2.5 oz) Last BM Date: 06/10/11  Intake/Output from previous day: 01/08 0701 - 01/09 0700 In: 1880 [P.O.:1380; IV Piggyback:500] Out: 2925 [Urine:2925] Total I/O In: 420 [P.O.:420] Out: 1575 [Urine:1575]   Physical Exam: General: Alert, awake, oriented x3, in no acute distress. HEENT: No bruits, no goiter. Heart: Regular rate and rhythm, without murmurs, rubs, gallops. Lungs: Clear to auscultation bilaterally. Abdomen: Soft, nontender, nondistended, positive bowel sounds. Extremities: No clubbing cyanosis or edema with positive pedal pulses. RUE: Right elbow in splint. Neuro: Grossly intact, nonfocal.    Lab Results: Basic Metabolic Panel:  Basename 06/13/11 0415 06/12/11 0500  NA 133* 134*  K 4.0 4.4  CL 99 97  CO2 25 27  GLUCOSE 137* 87  BUN 19 20  CREATININE 1.12* 1.16*  CALCIUM 8.7 8.6  MG -- --  PHOS -- --   Liver Function Tests:  Basename 06/13/11 0415 06/12/11 0500   AST 16 22  ALT 13 16  ALKPHOS 107 115  BILITOT 0.7 0.8  PROT 6.2 6.0  ALBUMIN 2.4* 2.4*   CBC:  Basename 06/13/11 0415 06/12/11 0851  WBC 14.5* 19.7*  NEUTROABS -- --  HGB 11.8* 12.6  HCT 37.2 39.6  MCV 93.0 93.4  PLT 222 238   CBG:  Basename 06/13/11 1609 06/13/11 1211 06/13/11 0807 06/13/11 0414 06/12/11 2325 06/12/11 2041  GLUCAP 169* 215* 142* 134* 117* 216*    No results found for this or any previous visit (from the past 240 hour(s)).  Studies/Results: Ct Heart Morp W/cta Cor W/score W/ca W/cm &/or Wo/cm  06/12/2011  *RADIOLOGY REPORT*  INDICATION:  Recent cardiac catheterization.  Dextrocardia with anomalous cardiac anatomy.  Now for further characterization.  CT ANGIOGRAPHY OF THE HEART, CORONARY ARTERY, STRUCTURE, AND MORPHOLOGY  CONTRAST: 80mL OMNIPAQUE IOHEXOL 350 MG/ML IV SOLN  COMPARISON:  CT of the abdomen and pelvis 06/01/2011  TECHNIQUE:  CT angiography of the coronary vessels was performed on a 256 channel system using prospective ECG gating.  A scout and noncontrast exam (for calcium scoring) were performed.  Circulation time was measured using a test bolus.  Coronary CTA was performed with sub mm slice collimation during portions of the cardiac cycle after prior injection of iodinated contrast.  Imaging post processing was performed on an independent workstation creating multiplanar and 3-D images, and quantitative analysis of the heart and coronary arteries.  Note that this exam targets the heart and the chest was not imaged in its entirety.  PREMEDICATION: Lopressor 75 mg, P.O.  FINDINGS:  Technical quality:  Adequate for the evaluation of anatomy.  Not adequate for evaluation for intrinsic disease due to irregular, elevated heart rate.  Heart rate:  75  CORONARY ARTERIES:  There is situs inversus.  The SVC and the IVC pass into the anatomic right atrium as expected.  There is atrioventricular concordance on both the right and the left side of the heart. There is  ventriculo-arterial concordance on both size of the heart as well.  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  At the aortic root, there is an anterior cusp which normally would be the right coronary cusp.  This will be referred report as the anterior cusp.  There is a posterior cusp which normally would be considered the left cusp.  This will be referred to as the posterior cusp.  There is a left cusp in the atrioventricular groove which will be considered the noncoronary cusp.  There is a single vessel arising from the anterior cusp which would normally be referred to as the right coronary artery.  This gives rise to a proximal branch which passes anteriorly in a pre-pulmonic course and continues in the interventricular groove in the typical distribution of the distal left anterior descending artery.  The right coronary artery continues and gives off an SA nodal branch and septal perforator branches.  The vessel continues to give rise to the acute marginal, posterolateral left ventricular branch, and posterior descending artery (right dominant).  Arising from the posterior sinus is a branch which would normally be considered the left main coronary artery.  This gives rise to a large branch which appears to continue as a large first diagonal branch as well as a smaller left circumflex branch.  Septal perforator branches also arise from the proximal portion of the large first diagonal branch.  CORONARY CALCIUM: Total Agatston Score:  82.95 MESA database percentile:  74%  CARDIAC MEASUREMENTS: Interventricular septum (6 - 12 mm):  11 mm LV posterior wall (6 - 12 mm):  10 mm LV diameter in diastole (35 - 52 mm):  37 mm  AORTA AND PULMONARY MEASUREMENTS: Aortic root (21 - 40 mm):             22 mm  at the annulus             33 mm  at the sinuses of Valsalva             25 mm  at the sinotubular junction Ascending aorta ( <  40 mm):  31 mm Descending aorta ( <  40 mm):  20 mm Main pulmonary artery:  ( <  30  mm):  23 mm  EXTRACARDIAC FINDINGS: Situs inversus also noted in the abdomen with a left sided liver. There are small bilateral pleural effusions, left slightly greater than right.  Atelectasis noted in both lower lobes.  IMPRESSION:  1. Situs inversus totalis.  What would typically be called to the right coronary artery arises from the anterior sinus of Valsalva. There is a proximal branch coursing in a pre pulmonic position which continues in the typical distal LAD distribution. Wall would typically be considered the left main coronary artery arises from the posterior sinus of Valsalva and gives rise to a large first diagonal branch and diminutive circumflex.  The patient's total coronary artery calcium score is 82.95, which is 74 percentile for patient's matched age and gender. 2.  Small bilateral pleural effusions with bibasilar atelectasis. 3.  Right coronary artery dominance.  Report was  called to CDU mid level at [phone] at  on .  Original Report Authenticated By: Cyndie Chime, M.D.    Medications: Scheduled Meds:    . albuterol  2.5 mg Nebulization Q6H  . aminocaproic acid (AMICAR) for OHS   Intravenous To OR  . aspirin EC  81 mg Oral Daily  . bisacodyl  5 mg Oral Once  . chlorhexidine  60 mL Topical Once  . chlorhexidine  60 mL Topical Once  . ciprofloxacin  400 mg Intravenous Q12H  . dexmedetomidine (PRECEDEX) IV infusion for high rates  0.1-0.7 mcg/kg/hr Intravenous To OR  . DOPamine  2-20 mcg/kg/min Intravenous To OR  . epinephrine  0.5-20 mcg/min Intravenous To OR  . heparin-papaverine-plasmalyte irrigation   Irrigation To OR  . insulin aspart  0-15 Units Subcutaneous Q4H  . insulin glargine  5 Units Subcutaneous QHS  . insulin (NOVOLIN-R) infusion   Intravenous To OR  . ipratropium  0.5 mg Nebulization Q6H  . magnesium sulfate  40 mEq Other To OR  . metoprolol tartrate  12.5 mg Oral BID  . metronidazole  500 mg Intravenous Q6H  . moxifloxacin  400 mg Intravenous To OR  .  nitroGLYCERIN  2-200 mcg/min Intravenous To OR  . pantoprazole  40 mg Oral QHS  . phenylephrine (NEO-SYNEPHRINE) Adult infusion  30-200 mcg/min Intravenous To OR  . potassium chloride  80 mEq Other To OR  . predniSONE  5 mg Oral Q breakfast  . vancomycin  1,500 mg Intravenous To OR  . DISCONTD: aminocaproic acid (AMICAR) for OHS   Intravenous To OR  . DISCONTD: chlorhexidine  60 mL Topical Once  . DISCONTD: dexmedetomidine (PRECEDEX) IV infusion for high rates  0.1-0.7 mcg/kg/hr Intravenous To OR  . DISCONTD: DOPamine  2-20 mcg/kg/min Intravenous To OR  . DISCONTD: epinephrine  0.5-20 mcg/min Intravenous To OR  . DISCONTD: heparin-papaverine-plasmalyte irrigation   Irrigation To OR  . DISCONTD: insulin (NOVOLIN-R) infusion   Intravenous To OR  . DISCONTD: magnesium sulfate  40 mEq Other To OR  . DISCONTD: metoprolol tartrate  12.5 mg Oral Once  . DISCONTD: moxifloxacin  400 mg Intravenous To OR  . DISCONTD: nitroGLYCERIN  2-200 mcg/min Intravenous To OR  . DISCONTD: phenylephrine (NEO-SYNEPHRINE) Adult infusion  30-200 mcg/min Intravenous To OR  . DISCONTD: potassium chloride  80 mEq Other To OR  . DISCONTD: vancomycin  1,250 mg Intravenous To OR   Continuous Infusions:    . sodium chloride 20 mL/hr at 06/11/11 0409  . lactated ringers     PRN Meds:.acetaminophen, albuterol, ALPRAZolam, diazepam, fentaNYL, LORazepam, midazolam, ondansetron, temazepam  Assessment/Plan:  SIRS/Sepstic shock: Resolved.   Cholangitis: Improving, on clear liquid diet. GI and surgery following. LFT's are normal. Cont Cipro and Flagyl. WBC trending down   CAD Patient has 4 vessel CAD,  CABG tomorrow   CHF: Pt started on diuresis with lasix. Improvement in shortness of breath. Cardiology following  Rheumatoid arthritis Continue prednisone, will change the prednisone to home dose  of 5 mg by mouth daily   S/P VDRF: Stable  AKI: Sec to septic shock Resolving. Cont to  monitor.  Anemia: H&H is stable.  Fractured right elbow:  F/u Ortho as outpatient.    Dextrocardia: Cardiology aware.  Hyperglycemia:  BG Stable. Continue SSI  DVT Prophylaxis: Heparin     LOS: 12 days   Arlin Sass [facility] Pager: [phone] 06/13/2011, 5:12 PM

## 2011-06-13 NOTE — Progress Notes (Signed)
2 Days Post-Op Procedure(s) (LRB): LEFT HEART CATHETERIZATION WITH CORONARY ANGIOGRAM (N/A) ARCH AORTOGRAM (Right) Subjective: Having some lower substernal chest discomfort and nausea this afternoon.  Objective: Vital signs in last 24 hours: Temp:  [97.3 F (36.3 C)-98.6 F (37 C)] 97.4 F (36.3 C) (01/09 1151) Pulse Rate:  [69-92] 85  (01/09 1151) Cardiac Rhythm:  [-] Junctional rhythm (01/09 0745) Resp:  [16-20] 18  (01/09 1151) BP: (95-117)/(54-64) 97/55 mmHg (01/09 1151) SpO2:  [93 %-100 %] 95 % (01/09 1151) Weight:  [98.3 kg (216 lb 11.4 oz)] 98.3 kg (216 lb 11.4 oz) (01/09 0600)  Hemodynamic parameters for last 24 hours:    Intake/Output from previous day: 01/08 0701 - 01/09 0700 In: 1880 [P.O.:1380; IV Piggyback:500] Out: 2925 [Urine:2925] Intake/Output this shift: Total I/O In: -  Out: 575 [Urine:575]  General appearance: alert, cooperative and fatigued Heart: regular rate and rhythm, S1, S2 normal, no murmur, click, rub or gallop Lungs: diminished breath sounds bibasilar and bilaterally Abdomen: soft, non-tender; bowel sounds normal; no masses,  no organomegaly Extremities: extremities normal, atraumatic, no cyanosis or edema  Lab Results:  Basename 06/13/11 0415 06/12/11 0851  WBC 14.5* 19.7*  HGB 11.8* 12.6  HCT 37.2 39.6  PLT 222 238   BMET:  Basename 06/13/11 0415 06/12/11 0500  NA 133* 134*  K 4.0 4.4  CL 99 97  CO2 25 27  GLUCOSE 137* 87  BUN 19 20  CREATININE 1.12* 1.16*  CALCIUM 8.7 8.6    PT/INR:  Basename 06/11/11 0400  LABPROT 14.9  INR 1.15   ABG    Component Value Date/Time   PHART 7.385 06/04/2011 0508   HCO3 16.3* 06/04/2011 0508   TCO2 17 06/04/2011 0508   ACIDBASEDEF 7.0* 06/04/2011 0508   O2SAT 98.0 06/04/2011 0508   CBG (last 3)   Basename 06/13/11 1211 06/13/11 0807 06/13/11 0414  GLUCAP 215* 142* 134*    Assessment/Plan: S/P Procedure(s) (LRB): LEFT HEART CATHETERIZATION WITH CORONARY ANGIOGRAM (N/A) ARCH  AORTOGRAM (Right)  Dextrocardia with multivessel coronary disease with high grade LAD, Ramus, and RCA stenoses.  Her chest discomfort and nausea may be ischemic.  I have reviewed all studies again and examined patient and I think it is probably best to proceed with CABG surgery. PCI is not ideal due to anatomical position of coronaries with dextrocardia and need for BMS. I discussed this with patient and family and they seem to understand and agree. I discussed the operative procedure with the patient and family including alternatives, benefits and risks; including but not limited to bleeding, blood transfusion, infection, stroke, myocardial infarction, graft failure, heart block requiring a permanent pacemaker, organ dysfunction, and death.  [name] Clear understands and agrees to proceed.  We will schedule surgery for the am.   LOS: 12 days    BARTLE,BRYAN K 06/13/2011

## 2011-06-13 NOTE — Progress Notes (Signed)
Physical Therapy Treatment Patient Details Name: [name] MRN: [id number] DOB: 11/02/1940 Today's Date: 06/13/2011  PT Assessment/Plan  PT - Assessment/Plan Comments on Treatment Session: Pt to have CABG tomorrow. Will f/u after sx.  PT Plan:  (will readdress plan following CABG) PT Goals  Acute Rehab PT Goals PT Goal: Supine/Side to Sit - Progress: Progressing toward goal PT Goal: Sit to Supine/Side - Progress: Progressing toward goal PT Goal: Sit to Stand - Progress: Progressing toward goal PT Goal: Stand to Sit - Progress: Progressing toward goal PT Transfer Goal: Bed to Chair/Chair to Bed - Progress: Progressing toward goal PT Goal: Stand - Progress: Progressing toward goal PT Goal: Ambulate - Progress: Progressing toward goal PT Goal: Up/Down Stairs - Progress: Not met PT Goal: Perform Home Exercise Program - Progress: Progressing toward goal  PT Treatment Precautions/Restrictions  Precautions Precautions: Fall Restrictions Weight Bearing Restrictions: Yes RUE Weight Bearing: Non weight bearing Other Position/Activity Restrictions: NWB through R elbow, ROM out of splint,  per orders Mobility (including Balance) Bed Mobility Supine to Sit: 1: +2 Total assist;HOB elevated (Comment degrees) (30 degrees) Supine to Sit Details (indicate cue type and reason): +2totalpt50%  Sitting - Scoot to Edge of Bed: 3: Mod assist Transfers Sit to Stand: 4: Min assist;From bed Sit to Stand Details (indicate cue type and reason): cueing for hand placement on left and for follow through to stand; pt needing unilateral HHA once standing Stand to Sit: 4: Min assist;To chair/3-in-1 Stand to Sit Details: cues for control descent and technique Ambulation/Gait Ambulation/Gait: Yes Ambulation/Gait Assistance: 3: Mod assist Ambulation/Gait Assistance Details (indicate cue type and reason): LUE HHA to amb approx 12 ft; pt anxious and complains of fatigue; slow shuffled gait Ambulation  Distance (Feet): 12 Feet Assistive device: 1 person hand held assist Gait Pattern: Trunk flexed;Shuffle  Posture/Postural Control Posture/Postural Control: No significant limitations Exercise  Total Joint Exercises Ankle Circles/Pumps: AROM;Both;10 reps;Supine General Exercises - Upper Extremity Elbow Flexion: AAROM;Right;10 reps;Seated (elbow supported by pillow) Elbow Extension: AAROM;Right;10 reps;Seated Wrist Flexion: AROM;Right;10 reps;Seated Wrist Extension: AROM;Right;10 reps;Seated End of Session PT - End of Session Equipment Utilized During Treatment: Gait belt Activity Tolerance: Patient tolerated treatment well;Patient limited by fatigue;Treatment limited secondary to medical complications (Comment) (pt to have CABG tomorrow, not feeling well) Patient left: in chair Nurse Communication: Mobility status for transfers;Mobility status for ambulation General Behavior During Session: [facility] for tasks performed Cognition: [facility] for tasks performed  [facility] HELEN 06/13/2011, 4:17 PM

## 2011-06-13 NOTE — Progress Notes (Signed)
SUBJECTIVE: No chest pain. No events.   BP 100/64  Pulse 92  Temp(Src) 98.3 F (36.8 C) (Oral)  Resp 18  Ht 5\' 6"  (1.676 m)  Wt 216 lb 11.4 oz (98.3 kg)  BMI 34.98 kg/m2  SpO2 93%  Intake/Output Summary (Last 24 hours) at 06/13/11 1610 Last data filed at 06/13/11 9604  Gross per 24 hour  Intake   1880 ml  Output   2925 ml  Net  -1045 ml    PHYSICAL EXAM General: Well developed, well nourished, in no acute distress. Alert and oriented x 3.  Psych:  Good affect, responds appropriately Neck: No JVD. No masses noted.  Lungs: Clear bilaterally with no wheezes or rhonci noted.  Heart: RRR with no murmurs noted. Abdomen: Bowel sounds are present. Soft, non-tender.  Extremities: No lower extremity edema.   LABS: Basic Metabolic Panel:  Basename 06/13/11 0415 06/12/11 0500  NA 133* 134*  K 4.0 4.4  CL 99 97  CO2 25 27  GLUCOSE 137* 87  BUN 19 20  CREATININE 1.12* 1.16*  CALCIUM 8.7 8.6  MG -- --  PHOS -- --   CBC:  Basename 06/13/11 0415 06/12/11 0851  WBC 14.5* 19.7*  NEUTROABS -- --  HGB 11.8* 12.6  HCT 37.2 39.6  MCV 93.0 93.4  PLT 222 238    Current Meds:    . albuterol  2.5 mg Nebulization Q6H  . aspirin EC  81 mg Oral Daily  . ciprofloxacin  400 mg Intravenous Q12H  . insulin aspart  0-15 Units Subcutaneous Q4H  . insulin glargine  5 Units Subcutaneous QHS  . ipratropium  0.5 mg Nebulization Q6H  . metoprolol tartrate  50 mg Oral Once  . metoprolol tartrate  12.5 mg Oral BID  . metoprolol tartrate  25 mg Oral Once  . metronidazole  500 mg Intravenous Q6H  . pantoprazole  40 mg Oral QHS  . predniSONE  5 mg Oral Q breakfast  . DISCONTD: captopril  6.25 mg Oral BID  . DISCONTD: metoprolol tartrate  50 mg Oral Once  . DISCONTD: predniSONE  10 mg Oral Q breakfast     ASSESSMENT AND PLAN:  1. CAD/NSTEMI: In setting of GB dzs and hypotension. No c/p. Cath with 4 vessel disease. She has dextrocardia which makes it difficult to find catheters to fit  well. 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  Cardiac CT shows that this vessel does not course between the great vessels. This vessel does in fact go to the anterior wall and has a high grade lesion, likely the culprit lesion.  CT surgery consult to discuss CABG vs multi-vessel PCI. Appreciate Dr. Garen Grams input. CABG will be high risk given her cholecystitis and also long term steroid use. I am worried however that a percutaneous approach will not provide complete revascularization. We would need to place bare metal stents in the LAD/RCA and anomalous branch since she will need cholecystectomy in the next 6 weeks. This would be high risk for restenosis with multi-vessel stenting with bare metal stents, assuming we could even engage the anomalous vessel for PCI, which is likely her culprit lesion. I have discussed this today with Dr. Laneta Simmers. We will have further discussions with the patient and family in regards to PCI vs CABG. I spent 20 minutes today discussing with pt and family.  Cont asa, BB.  Add Statin when ok with GI/surgery (has had  elevated LFT's - improving).   2. ICM: EF 45% by echo and cath. Volume looks good. Off lasix. No Ace-inh secondary to hypotension.   3. Cholecystitis- choledocholethiasis/ cholangitis: s/p ERCP, sphincterotomy, CBD stone extraction. Plan for cholecystectomy in 4-6 weeks following cardiac procedures.   4. DM: Per primary team.     MCALHANY,CHRISTOPHER  1/9/20138:14 AM

## 2011-06-13 NOTE — Progress Notes (Signed)
Patient discussed at the Long Length of Stay [name] Weeks 06/13/2011  

## 2011-06-13 NOTE — Progress Notes (Signed)
2 Days Post-Op  Subjective: No ab pain  Objective: Vital signs in last 24 hours: Temp:  [97.3 F (36.3 C)-98.6 F (37 C)] 98.3 F (36.8 C) (01/09 0747) Pulse Rate:  [69-92] 92  (01/09 0747) Resp:  [16-20] 18  (01/09 0747) BP: (95-117)/(54-64) 100/64 mmHg (01/09 0747) SpO2:  [91 %-100 %] 93 % (01/09 0747) Weight:  [216 lb 11.4 oz (98.3 kg)] 216 lb 11.4 oz (98.3 kg) (01/09 0600) Last BM Date: 06/10/11  Intake/Output from previous day: 01/08 0701 - 01/09 0700 In: 1880 [P.O.:1380; IV Piggyback:500] Out: 2925 [Urine:2925] Intake/Output this shift:     Lab Results:   Basename 06/13/11 0415 06/12/11 0851  WBC 14.5* 19.7*  HGB 11.8* 12.6  HCT 37.2 39.6  PLT 222 238   BMET  Basename 06/13/11 0415 06/12/11 0500  NA 133* 134*  K 4.0 4.4  CL 99 97  CO2 25 27  GLUCOSE 137* 87  BUN 19 20  CREATININE 1.12* 1.16*  CALCIUM 8.7 8.6   PT/INR  Basename 06/11/11 0400  LABPROT 14.9  INR 1.15   ABG No results found for this basename: PHART:2,PCO2:2,PO2:2,HCO3:2 in the last 72 hours  Studies/Results: Ct Heart Morp W/cta Cor W/score W/ca W/cm &/or Wo/cm  06/12/2011  *RADIOLOGY REPORT*  INDICATION:  Recent cardiac catheterization.  Dextrocardia with anomalous cardiac anatomy.  Now for further characterization.  CT ANGIOGRAPHY OF THE HEART, CORONARY ARTERY, STRUCTURE, AND MORPHOLOGY  CONTRAST: 80mL OMNIPAQUE IOHEXOL 350 MG/ML IV SOLN  COMPARISON:  CT of the abdomen and pelvis 06/01/2011  TECHNIQUE:  CT angiography of the coronary vessels was performed on a 256 channel system using prospective ECG gating.  A scout and noncontrast exam (for calcium scoring) were performed.  Circulation time was measured using a test bolus.  Coronary CTA was performed with sub mm slice collimation during portions of the cardiac cycle after prior injection of iodinated contrast.  Imaging post processing was performed on an independent workstation creating multiplanar and 3-D images, and quantitative analysis  of the heart and coronary arteries.  Note that this exam targets the heart and the chest was not imaged in its entirety.  PREMEDICATION: Lopressor 75 mg, P.O.  FINDINGS: Technical quality:  Adequate for the evaluation of anatomy.  Not adequate for evaluation for intrinsic disease due to irregular, elevated heart rate.  Heart rate:  75  CORONARY ARTERIES:  There is situs inversus.  The SVC and the IVC pass into the anatomic right atrium as expected.  There is atrioventricular concordance on both the right and the left side of the heart. There is ventriculo-arterial concordance on both size of the heart as well.  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  At the aortic root, there is an anterior cusp which normally would be the right coronary cusp.  This will be referred report as the anterior cusp.  There is a posterior cusp which normally would be considered the left cusp.  This will be referred to as the posterior cusp.  There is a left cusp in the atrioventricular groove which will be considered the noncoronary cusp.  There is a single vessel arising from the anterior cusp which would normally be referred to as the right coronary artery.  This gives rise to a proximal branch which passes anteriorly in a pre-pulmonic course and continues in the interventricular groove in the typical distribution of the distal left anterior descending artery.  The right coronary artery continues and gives off an SA nodal branch and septal  perforator branches.  The vessel continues to give rise to the acute marginal, posterolateral left ventricular branch, and posterior descending artery (right dominant).  Arising from the posterior sinus is a branch which would normally be considered the left main coronary artery.  This gives rise to a large branch which appears to continue as a large first diagonal branch as well as a smaller left circumflex branch.  Septal perforator branches also arise from the proximal portion of the  large first diagonal branch.  CORONARY CALCIUM: Total Agatston Score:  82.95 MESA database percentile:  74%  CARDIAC MEASUREMENTS: Interventricular septum (6 - 12 mm):  11 mm LV posterior wall (6 - 12 mm):  10 mm LV diameter in diastole (35 - 52 mm):  37 mm  AORTA AND PULMONARY MEASUREMENTS: Aortic root (21 - 40 mm):             22 mm  at the annulus             33 mm  at the sinuses of Valsalva             25 mm  at the sinotubular junction Ascending aorta ( <  40 mm):  31 mm Descending aorta ( <  40 mm):  20 mm Main pulmonary artery:  ( <  30 mm):  23 mm  EXTRACARDIAC FINDINGS: Situs inversus also noted in the abdomen with a left sided liver. There are small bilateral pleural effusions, left slightly greater than right.  Atelectasis noted in both lower lobes.  IMPRESSION:  1. Situs inversus totalis.  What would typically be called to the right coronary artery arises from the anterior sinus of Valsalva. There is a proximal branch coursing in a pre pulmonic position which continues in the typical distal LAD distribution. Wall would typically be considered the left main coronary artery arises from the posterior sinus of Valsalva and gives rise to a large first diagonal branch and diminutive circumflex.  The patient's total coronary artery calcium score is 82.95, which is 74 percentile for patient's matched age and gender. 2.  Small bilateral pleural effusions with bibasilar atelectasis. 3.  Right coronary artery dominance.  Report was called to CDU mid level at [phone] at  on .  Original Report Authenticated By: Cyndie Chime, M.D.    Anti-infectives: Anti-infectives     Start     Dose/Rate Route Frequency Ordered Stop   06/05/11 1300   ciprofloxacin (CIPRO) IVPB 400 mg     Comments: pharamcy dose      400 mg 200 mL/hr over 60 Minutes Intravenous Every 12 hours 06/05/11 1130     06/05/11 1300   metroNIDAZOLE (FLAGYL) IVPB 500 mg        500 mg 100 mL/hr over 60 Minutes Intravenous 4 times per day  06/05/11 1130     06/04/11 1200   imipenem-cilastatin (PRIMAXIN) 250 mg in sodium chloride 0.9 % 100 mL IVPB  Status:  Discontinued        250 mg 200 mL/hr over 30 Minutes Intravenous 4 times per day 06/04/11 1031 06/05/11 1130   06/02/11 2200   imipenem-cilastatin (PRIMAXIN) 500 mg in sodium chloride 0.9 % 100 mL IVPB  Status:  Discontinued        500 mg 200 mL/hr over 30 Minutes Intravenous 3 times per day 06/02/11 1226 06/04/11 1031   06/02/11 0600   metroNIDAZOLE (FLAGYL) IVPB 500 mg  Status:  Discontinued        500  mg 100 mL/hr over 60 Minutes Intravenous Every 8 hours 06/02/11 0504 06/02/11 0511   06/02/11 0600   imipenem-cilastatin (PRIMAXIN) 500 mg in sodium chloride 0.9 % 100 mL IVPB  Status:  Discontinued        500 mg 200 mL/hr over 30 Minutes Intravenous 4 times per day 06/02/11 0513 06/02/11 1226   06/02/11 0445   metroNIDAZOLE (FLAGYL) IVPB 500 mg  Status:  Discontinued        500 mg 100 mL/hr over 60 Minutes Intravenous  Once 06/02/11 0431 06/02/11 0432   06/02/11 0430   ertapenem (INVANZ) 1 g in sodium chloride 0.9 % 50 mL IVPB  Status:  Discontinued        1 g 100 mL/hr over 30 Minutes Intravenous Daily 06/02/11 0400 06/02/11 0512   06/01/11 2230   ciprofloxacin (CIPRO) IVPB 400 mg        400 mg 200 mL/hr over 60 Minutes Intravenous  Once 06/01/11 2215 06/02/11 0015   06/01/11 2230   metroNIDAZOLE (FLAGYL) IVPB 500 mg  Status:  Discontinued        500 mg 100 mL/hr over 60 Minutes Intravenous Every 8 hours 06/01/11 2215 06/02/11 0402          Assessment/Plan: History cholangitis and ERCP Severe CAD  I think best plan would be for cardiac intervention first.  She will need a cholecystectomy at some point in next number of months.  ERCP with sphincterotomy is temporizing procedure and should likely prevent further episodes of choledocholithiasis and cholangitis.  I will see again after revascularization and discuss cholecystectomy at some point.   I did warn  them there is the possibility of recurrence but I think risk of operation right now with cardiac findings outweighs this.   LOS: 12 days    [facility] 06/13/2011

## 2011-06-14 ENCOUNTER — Other Ambulatory Visit: Payer: Self-pay

## 2011-06-14 ENCOUNTER — Inpatient Hospital Stay (HOSPITAL_COMMUNITY): Payer: Medicare Other | Admitting: *Deleted

## 2011-06-14 ENCOUNTER — Encounter (HOSPITAL_COMMUNITY)
Admission: EM | Disposition: A | Discharge status: Home Health | Payer: Self-pay | Source: Home / Self Care | Attending: Surgery

## 2011-06-14 ENCOUNTER — Encounter (HOSPITAL_COMMUNITY): Payer: Self-pay | Admitting: *Deleted

## 2011-06-14 ENCOUNTER — Inpatient Hospital Stay (HOSPITAL_COMMUNITY): Payer: Medicare Other

## 2011-06-14 DIAGNOSIS — I2510 Atherosclerotic heart disease of native coronary artery without angina pectoris: Secondary | ICD-10-CM

## 2011-06-14 HISTORY — PX: CORONARY ARTERY BYPASS GRAFT: SHX141

## 2011-06-14 LAB — CBC
HCT: 36.4 % (ref 36.0–46.0)
HCT: 28.9 % — ABNORMAL LOW (ref 36.0–46.0)
Hemoglobin: 12 g/dL (ref 12.0–15.0)
Hemoglobin: 9.4 g/dL — ABNORMAL LOW (ref 12.0–15.0)
MCHC: 33 g/dL (ref 30.0–36.0)
MCV: 92.2 fL (ref 78.0–100.0)
Platelets: 156 10*3/uL (ref 150–400)
RBC: 3.13 MIL/uL — ABNORMAL LOW (ref 3.87–5.11)
RDW: 14.6 % (ref 11.5–15.5)
RDW: 14.6 % (ref 11.5–15.5)
WBC: 17.5 10*3/uL — ABNORMAL HIGH (ref 4.0–10.5)
WBC: 14 10*3/uL — ABNORMAL HIGH (ref 4.0–10.5)

## 2011-06-14 LAB — POCT I-STAT 3, ART BLOOD GAS (G3+)
Acid-base deficit: 2 mmol/L (ref 0.0–2.0)
Acid-base deficit: 1 mmol/L (ref 0.0–2.0)
Bicarbonate: 24.9 mEq/L — ABNORMAL HIGH (ref 20.0–24.0)
Bicarbonate: 23.8 mEq/L (ref 20.0–24.0)
Bicarbonate: 21.9 mEq/L (ref 20.0–24.0)
O2 Saturation: 95 %
Patient temperature: 36.5
Patient temperature: 36.5
TCO2: 23 mmol/L (ref 0–100)
TCO2: 23 mmol/L (ref 0–100)
pCO2 arterial: 41.6 mmHg (ref 35.0–45.0)
pCO2 arterial: 37.1 mmHg (ref 35.0–45.0)
pCO2 arterial: 37.1 mmHg (ref 35.0–45.0)
pH, Arterial: 7.289 — ABNORMAL LOW (ref 7.350–7.400)
pH, Arterial: 7.376 (ref 7.350–7.400)
pO2, Arterial: 98 mmHg (ref 80.0–100.0)
pO2, Arterial: 80 mmHg (ref 80.0–100.0)

## 2011-06-14 LAB — COMPREHENSIVE METABOLIC PANEL
Albumin: 2.5 g/dL — ABNORMAL LOW (ref 3.5–5.2)
Alkaline Phosphatase: 104 U/L (ref 39–117)
BUN: 16 mg/dL (ref 6–23)
Creatinine, Ser: 1.09 mg/dL (ref 0.50–1.10)
GFR calc Af Amer: 59 mL/min — ABNORMAL LOW (ref >90)
Glucose, Bld: 125 mg/dL — ABNORMAL HIGH (ref 70–99)
Potassium: 3.9 mEq/L (ref 3.5–5.1)
Total Bilirubin: 0.7 mg/dL (ref 0.3–1.2)
Total Protein: 6 g/dL (ref 6.0–8.3)

## 2011-06-14 LAB — LIPID PANEL
HDL: 36 mg/dL — ABNORMAL LOW (ref >39)
LDL Cholesterol: 70 mg/dL (ref 0–99)
Total CHOL/HDL Ratio: 3.4 RATIO
Triglycerides: 91 mg/dL (ref <150)
VLDL: 18 mg/dL (ref 0–40)

## 2011-06-14 LAB — POCT I-STAT, CHEM 8
BUN: 13 mg/dL (ref 6–23)
Calcium, Ion: 1.17 mmol/L (ref 1.12–1.32)
Chloride: 105 mEq/L (ref 96–112)
Potassium: 4.6 mEq/L (ref 3.5–5.1)

## 2011-06-14 LAB — PLATELET COUNT: Platelets: 124 10*3/uL — ABNORMAL LOW (ref 150–400)

## 2011-06-14 LAB — POCT I-STAT 4, (NA,K, GLUC, HGB,HCT)
Glucose, Bld: 110 mg/dL — ABNORMAL HIGH (ref 70–99)
Glucose, Bld: 160 mg/dL — ABNORMAL HIGH (ref 70–99)
Glucose, Bld: 155 mg/dL — ABNORMAL HIGH (ref 70–99)
HCT: 20 % — ABNORMAL LOW (ref 36.0–46.0)
HCT: 30 % — ABNORMAL LOW (ref 36.0–46.0)
HCT: 26 % — ABNORMAL LOW (ref 36.0–46.0)
Hemoglobin: 10.2 g/dL — ABNORMAL LOW (ref 12.0–15.0)
Hemoglobin: 8.5 g/dL — ABNORMAL LOW (ref 12.0–15.0)
Hemoglobin: 8.8 g/dL — ABNORMAL LOW (ref 12.0–15.0)
Potassium: 3.7 mEq/L (ref 3.5–5.1)
Potassium: 3.8 mEq/L (ref 3.5–5.1)
Potassium: 3.9 mEq/L (ref 3.5–5.1)
Potassium: 4.6 mEq/L (ref 3.5–5.1)
Potassium: 4 mEq/L (ref 3.5–5.1)
Potassium: 3.9 mEq/L (ref 3.5–5.1)
Sodium: 136 mEq/L (ref 135–145)
Sodium: 135 mEq/L (ref 135–145)

## 2011-06-14 LAB — GLUCOSE, CAPILLARY
Glucose-Capillary: 145 mg/dL — ABNORMAL HIGH (ref 70–99)
Glucose-Capillary: 118 mg/dL — ABNORMAL HIGH (ref 70–99)
Glucose-Capillary: 102 mg/dL — ABNORMAL HIGH (ref 70–99)
Glucose-Capillary: 95 mg/dL (ref 70–99)
Glucose-Capillary: 110 mg/dL — ABNORMAL HIGH (ref 70–99)

## 2011-06-14 LAB — BLOOD GAS, ARTERIAL
Acid-Base Excess: 2.2 mmol/L — ABNORMAL HIGH (ref 0.0–2.0)
Drawn by: 31276
FIO2: 0.21 %
O2 Saturation: 92.2 %
Patient temperature: 99.1

## 2011-06-14 LAB — PREPARE PLATELET PHERESIS: Unit division: 0

## 2011-06-14 LAB — APTT: aPTT: 31 seconds (ref 24–37)

## 2011-06-14 LAB — CREATININE, SERUM
Creatinine, Ser: 0.97 mg/dL (ref 0.50–1.10)
GFR calc non Af Amer: 58 mL/min — ABNORMAL LOW (ref >90)

## 2011-06-14 LAB — PROTIME-INR: INR: 1.92 — ABNORMAL HIGH (ref 0.00–1.49)

## 2011-06-14 LAB — HEMOGLOBIN A1C: Hgb A1c MFr Bld: 7.1 % — ABNORMAL HIGH (ref <5.7)

## 2011-06-14 SURGERY — CORONARY ARTERY BYPASS GRAFTING (CABG)
Anesthesia: General | Site: Chest | Wound class: Clean

## 2011-06-14 MED ORDER — ASPIRIN EC 325 MG PO TBEC
325.0000 mg | DELAYED_RELEASE_TABLET | Freq: Every day | ORAL | Status: DC
  Administered 2011-06-15 – 2011-06-26 (×10): 325 mg via ORAL
  Filled 2011-06-14 (×12): qty 1

## 2011-06-14 MED ORDER — ACETAMINOPHEN 650 MG RE SUPP
650.0000 mg | RECTAL | Status: AC
  Administered 2011-06-14: 650 mg via RECTAL

## 2011-06-14 MED ORDER — SODIUM CHLORIDE 0.9 % IV SOLN: 250.0000 mL | INTRAVENOUS | Status: DC

## 2011-06-14 MED ORDER — INSULIN REGULAR BOLUS VIA INFUSION
0.0000 [IU] | Freq: Three times a day (TID) | INTRAVENOUS | Status: DC
  Filled 2011-06-14 (×5): qty 10

## 2011-06-14 MED ORDER — DEXMEDETOMIDINE HCL 100 MCG/ML IV SOLN
0.1000 ug/kg/h | INTRAVENOUS | Status: DC
  Administered 2011-06-14: 0.5 ug/kg/h via INTRAVENOUS

## 2011-06-14 MED ORDER — SODIUM CHLORIDE 0.9 % IV SOLN
10.0000 g | INTRAVENOUS | Status: DC | PRN
  Administered 2011-06-14: 5 g/h via INTRAVENOUS

## 2011-06-14 MED ORDER — POTASSIUM CHLORIDE 10 MEQ/50ML IV SOLN
10.0000 meq | INTRAVENOUS | Status: AC
  Administered 2011-06-14 (×3): 10 meq via INTRAVENOUS

## 2011-06-14 MED ORDER — VANCOMYCIN HCL 1000 MG IV SOLR
1000.0000 mg | Freq: Once | INTRAVENOUS | Status: AC
  Administered 2011-06-14: 1000 mg via INTRAVENOUS
  Filled 2011-06-14: qty 1000

## 2011-06-14 MED ORDER — SODIUM CHLORIDE 0.9 % IJ SOLN
3.0000 mL | Freq: Two times a day (BID) | INTRAMUSCULAR | Status: DC
  Administered 2011-06-15 – 2011-06-18 (×7): 3 mL via INTRAVENOUS
  Administered 2011-06-19: 10:00:00 via INTRAVENOUS
  Administered 2011-06-19: 3 mL via INTRAVENOUS

## 2011-06-14 MED ORDER — LACTATED RINGERS IV SOLN
INTRAVENOUS | Status: DC
  Administered 2011-06-14: 20 mL via INTRAVENOUS

## 2011-06-14 MED ORDER — ONDANSETRON HCL 4 MG/2ML IJ SOLN
4.0000 mg | Freq: Four times a day (QID) | INTRAMUSCULAR | Status: DC | PRN
  Administered 2011-06-14 – 2011-06-18 (×4): 4 mg via INTRAVENOUS
  Filled 2011-06-14 (×4): qty 2

## 2011-06-14 MED ORDER — SODIUM CHLORIDE 0.9 % IV SOLN
INTRAVENOUS | Status: DC | PRN
  Administered 2011-06-14: 12:00:00 via INTRAVENOUS

## 2011-06-14 MED ORDER — BISACODYL 5 MG PO TBEC
10.0000 mg | DELAYED_RELEASE_TABLET | Freq: Every day | ORAL | Status: DC
  Administered 2011-06-15 – 2011-06-18 (×3): 10 mg via ORAL
  Filled 2011-06-14 (×4): qty 2

## 2011-06-14 MED ORDER — LACTATED RINGERS IV SOLN
INTRAVENOUS | Status: DC | PRN
  Administered 2011-06-14: 07:00:00 via INTRAVENOUS

## 2011-06-14 MED ORDER — LACTATED RINGERS IV SOLN: 500.0000 mL | Freq: Once | INTRAVENOUS | Status: AC | PRN

## 2011-06-14 MED ORDER — SODIUM CHLORIDE 0.9 % IJ SOLN
10.0000 mL | INTRAMUSCULAR | Status: DC | PRN
  Administered 2011-06-14 (×4): 10 mL via INTRAVENOUS

## 2011-06-14 MED ORDER — PAPAVERINE HCL 30 MG/ML IJ SOLN
INTRAMUSCULAR | Status: DC
  Filled 2011-06-14: qty 0.5

## 2011-06-14 MED ORDER — ACETAMINOPHEN 500 MG PO TABS
1000.0000 mg | ORAL_TABLET | Freq: Four times a day (QID) | ORAL | Status: AC
  Administered 2011-06-15 – 2011-06-16 (×4): 1000 mg via ORAL
  Filled 2011-06-14 (×14): qty 2

## 2011-06-14 MED ORDER — SODIUM CHLORIDE 0.9 % IV SOLN
0.4000 ug/kg/h | INTRAVENOUS | Status: DC
  Filled 2011-06-14: qty 2

## 2011-06-14 MED ORDER — SODIUM CHLORIDE 0.9 % IV SOLN
0.1000 ug/kg/h | INTRAVENOUS | Status: DC
  Filled 2011-06-14: qty 4

## 2011-06-14 MED ORDER — PHENYLEPHRINE HCL 10 MG/ML IJ SOLN
0.0000 ug/min | INTRAVENOUS | Status: DC
  Administered 2011-06-15: 10 ug/min via INTRAVENOUS
  Filled 2011-06-14: qty 2

## 2011-06-14 MED ORDER — CIPROFLOXACIN IN D5W 400 MG/200ML IV SOLN
400.0000 mg | Freq: Two times a day (BID) | INTRAVENOUS | Status: DC
  Administered 2011-06-14 – 2011-06-18 (×8): 400 mg via INTRAVENOUS
  Filled 2011-06-14 (×9): qty 200

## 2011-06-14 MED ORDER — METOPROLOL TARTRATE 1 MG/ML IV SOLN: 2.5000 mg | INTRAVENOUS | Status: DC | PRN

## 2011-06-14 MED ORDER — 0.9 % SODIUM CHLORIDE (POUR BTL) OPTIME
TOPICAL | Status: DC | PRN
  Administered 2011-06-14: 5000 mL

## 2011-06-14 MED ORDER — ASPIRIN 81 MG PO CHEW
324.0000 mg | CHEWABLE_TABLET | Freq: Every day | ORAL | Status: DC
  Administered 2011-06-18: 324 mg
  Filled 2011-06-14: qty 4

## 2011-06-14 MED ORDER — LACTATED RINGERS IV SOLN
INTRAVENOUS | Status: DC | PRN
Start: 2011-06-14 — End: 2011-06-14
  Administered 2011-06-14 (×2): via INTRAVENOUS

## 2011-06-14 MED ORDER — ACETAMINOPHEN 500 MG PO TABS: 1000.0000 mg | ORAL_TABLET | Freq: Four times a day (QID) | ORAL | Status: DC

## 2011-06-14 MED ORDER — PROTAMINE SULFATE 10 MG/ML IV SOLN
INTRAVENOUS | Status: DC | PRN
  Administered 2011-06-14: 300 mg via INTRAVENOUS

## 2011-06-14 MED ORDER — ACETAMINOPHEN 160 MG/5ML PO SOLN: 650.0000 mg | ORAL | Status: AC

## 2011-06-14 MED ORDER — MAGNESIUM SULFATE 40 MG/ML IJ SOLN
4.0000 g | Freq: Once | INTRAMUSCULAR | Status: AC
  Administered 2011-06-14: 4 g via INTRAVENOUS
  Filled 2011-06-14: qty 100

## 2011-06-14 MED ORDER — FAMOTIDINE IN NACL 20-0.9 MG/50ML-% IV SOLN: 20.0000 mg | Freq: Two times a day (BID) | INTRAVENOUS | Status: AC

## 2011-06-14 MED ORDER — METOPROLOL TARTRATE 12.5 MG HALF TABLET
12.5000 mg | ORAL_TABLET | Freq: Two times a day (BID) | ORAL | Status: DC
  Filled 2011-06-14 (×3): qty 1

## 2011-06-14 MED ORDER — ALBUMIN HUMAN 5 % IV SOLN
250.0000 mL | INTRAVENOUS | Status: AC | PRN
  Administered 2011-06-14 (×2): 250 mL via INTRAVENOUS

## 2011-06-14 MED ORDER — ROCURONIUM BROMIDE 100 MG/10ML IV SOLN
INTRAVENOUS | Status: DC | PRN
  Administered 2011-06-14: 50 mg via INTRAVENOUS

## 2011-06-14 MED ORDER — PROPOFOL 10 MG/ML IV EMUL
INTRAVENOUS | Status: DC | PRN
  Administered 2011-06-14: 100 mg via INTRAVENOUS

## 2011-06-14 MED ORDER — MIDAZOLAM HCL 2 MG/2ML IJ SOLN: 2.0000 mg | INTRAMUSCULAR | Status: DC | PRN

## 2011-06-14 MED ORDER — SODIUM CHLORIDE 0.9 % IV SOLN
10.0000 mg | INTRAVENOUS | Status: DC | PRN
  Administered 2011-06-14: 10 ug via INTRAVENOUS

## 2011-06-14 MED ORDER — MORPHINE SULFATE 4 MG/ML IJ SOLN
2.0000 mg | INTRAMUSCULAR | Status: DC | PRN
  Administered 2011-06-15 (×2): 2 mg via INTRAVENOUS
  Filled 2011-06-14: qty 1

## 2011-06-14 MED ORDER — SODIUM CHLORIDE 0.9 % IV SOLN
200.0000 ug | INTRAVENOUS | Status: DC | PRN
  Administered 2011-06-14: .2 ug via INTRAVENOUS

## 2011-06-14 MED ORDER — INSULIN REGULAR HUMAN 100 UNIT/ML IJ SOLN
INTRAMUSCULAR | Status: DC
  Administered 2011-06-15: 2.2 [IU]/h via INTRAVENOUS
  Filled 2011-06-14: qty 1

## 2011-06-14 MED ORDER — MORPHINE SULFATE 2 MG/ML IJ SOLN
1.0000 mg | INTRAMUSCULAR | Status: AC | PRN
Start: 2011-06-14 — End: 2011-06-15
  Administered 2011-06-14: 2 mg via INTRAVENOUS
  Administered 2011-06-14: 1 mg via INTRAVENOUS
  Administered 2011-06-14: 2 mg via INTRAVENOUS
  Administered 2011-06-14: 1 mg via INTRAVENOUS
  Administered 2011-06-15: 2 mg via INTRAVENOUS
  Filled 2011-06-14 (×5): qty 1

## 2011-06-14 MED ORDER — BISACODYL 10 MG RE SUPP: 10.0000 mg | Freq: Every day | RECTAL | Status: DC

## 2011-06-14 MED ORDER — THROMBIN 20000 UNITS EX KIT
PACK | OROMUCOSAL | Status: DC | PRN
  Administered 2011-06-14: 09:00:00 via TOPICAL

## 2011-06-14 MED ORDER — VECURONIUM BROMIDE 10 MG IV SOLR
INTRAVENOUS | Status: DC | PRN
  Administered 2011-06-14 (×2): 4 mg via INTRAVENOUS
  Administered 2011-06-14: 2 mg via INTRAVENOUS

## 2011-06-14 MED ORDER — NITROGLYCERIN IN D5W 200-5 MCG/ML-% IV SOLN: 0.0000 ug/min | INTRAVENOUS | Status: DC

## 2011-06-14 MED ORDER — DOCUSATE SODIUM 100 MG PO CAPS
200.0000 mg | ORAL_CAPSULE | Freq: Every day | ORAL | Status: DC
  Administered 2011-06-15 – 2011-06-18 (×2): 200 mg via ORAL
  Filled 2011-06-14 (×3): qty 2

## 2011-06-14 MED ORDER — SODIUM CHLORIDE 0.9 % IV SOLN: INTRAVENOUS | Status: DC

## 2011-06-14 MED ORDER — SODIUM CHLORIDE 0.9 % IJ SOLN: 3.0000 mL | INTRAMUSCULAR | Status: DC | PRN

## 2011-06-14 MED ORDER — NITROGLYCERIN IN D5W 200-5 MCG/ML-% IV SOLN
INTRAVENOUS | Status: DC | PRN
  Administered 2011-06-14: 16.6 ug/min via INTRAVENOUS

## 2011-06-14 MED ORDER — OXYCODONE HCL 5 MG PO TABS
5.0000 mg | ORAL_TABLET | ORAL | Status: DC | PRN
  Administered 2011-06-15 – 2011-06-16 (×3): 10 mg via ORAL
  Filled 2011-06-14 (×3): qty 2

## 2011-06-14 MED ORDER — METOPROLOL TARTRATE 25 MG/10 ML ORAL SUSPENSION
12.5000 mg | Freq: Two times a day (BID) | ORAL | Status: DC
  Filled 2011-06-14 (×3): qty 5

## 2011-06-14 MED ORDER — SODIUM CHLORIDE 0.45 % IV SOLN
INTRAVENOUS | Status: DC
  Administered 2011-06-14: 20 mL/h via INTRAVENOUS

## 2011-06-14 MED ORDER — ACETAMINOPHEN 160 MG/5ML PO SOLN
975.0000 mg | Freq: Four times a day (QID) | ORAL | Status: AC
  Administered 2011-06-15 – 2011-06-18 (×8): 975 mg
  Filled 2011-06-14 (×8): qty 40.6

## 2011-06-14 MED ORDER — ONDANSETRON HCL 4 MG/2ML IJ SOLN
INTRAMUSCULAR | Status: DC | PRN
  Administered 2011-06-14: 4 mg via INTRAVENOUS

## 2011-06-14 MED ORDER — ACETAMINOPHEN 160 MG/5ML PO SOLN: 975.0000 mg | Freq: Four times a day (QID) | ORAL | Status: DC

## 2011-06-14 MED ORDER — LACTATED RINGERS IV SOLN
INTRAVENOUS | Status: DC | PRN
  Administered 2011-06-14 (×2): via INTRAVENOUS

## 2011-06-14 MED ORDER — FENTANYL CITRATE 0.05 MG/ML IJ SOLN
INTRAMUSCULAR | Status: DC | PRN
  Administered 2011-06-14 (×3): 250 ug via INTRAVENOUS
  Administered 2011-06-14: 200 ug via INTRAVENOUS
  Administered 2011-06-14: 50 ug via INTRAVENOUS
  Administered 2011-06-14 (×2): 250 ug via INTRAVENOUS

## 2011-06-14 MED ORDER — SODIUM CHLORIDE 0.9 % IV SOLN
100.0000 [IU] | INTRAVENOUS | Status: DC | PRN
  Administered 2011-06-14: 2.5 [IU]/h via INTRAVENOUS

## 2011-06-14 MED ORDER — PAPAVERINE HCL 30 MG/ML IJ SOLN
INTRAMUSCULAR | Status: DC | PRN
  Administered 2011-06-14: 60 mg

## 2011-06-14 MED ORDER — HEPARIN SODIUM (PORCINE) 1000 UNIT/ML IJ SOLN
INTRAMUSCULAR | Status: DC | PRN
  Administered 2011-06-14: 35000 [IU] via INTRAVENOUS

## 2011-06-14 MED ORDER — SODIUM CHLORIDE 0.9 % IV SOLN
5.0000 g | INTRAVENOUS | Status: DC
  Filled 2011-06-14: qty 20

## 2011-06-14 MED ORDER — PANTOPRAZOLE SODIUM 40 MG PO TBEC
40.0000 mg | DELAYED_RELEASE_TABLET | Freq: Every day | ORAL | Status: DC
  Administered 2011-06-16: 40 mg via ORAL
  Filled 2011-06-14: qty 1

## 2011-06-14 MED ORDER — MIDAZOLAM HCL 5 MG/5ML IJ SOLN
INTRAMUSCULAR | Status: DC | PRN
  Administered 2011-06-14: 2 mg via INTRAVENOUS
  Administered 2011-06-14: 3 mg via INTRAVENOUS
  Administered 2011-06-14: 2 mg via INTRAVENOUS
  Administered 2011-06-14: 3 mg via INTRAVENOUS

## 2011-06-14 MED FILL — Magnesium Sulfate Inj 50%: INTRAMUSCULAR | Qty: 10 | Status: AC

## 2011-06-14 MED FILL — Potassium Chloride Inj 2 mEq/ML: INTRAVENOUS | Qty: 40 | Status: AC

## 2011-06-14 MED FILL — Dexmedetomidine HCl IV Soln 200 MCG/2ML: INTRAVENOUS | Qty: 2 | Status: AC

## 2011-06-14 SURGICAL SUPPLY — 116 items
ADAPTER CARDIO PERF ANTE/RETRO (ADAPTER) IMPLANT
ADPR PRFSN 84XANTGRD RTRGD (ADAPTER)
ARTERIAL PRESSURE LINE (MISCELLANEOUS) ×2 IMPLANT
ATTRACTOMAT 16X20 MAGNETIC DRP (DRAPES) ×2 IMPLANT
BAG DECANTER FOR FLEXI CONT (MISCELLANEOUS) ×2 IMPLANT
BANDAGE ELASTIC 4 VELCRO ST LF (GAUZE/BANDAGES/DRESSINGS) ×2 IMPLANT
BANDAGE ELASTIC 6 VELCRO ST LF (GAUZE/BANDAGES/DRESSINGS) ×2 IMPLANT
BANDAGE GAUZE ELAST BULKY 4 IN (GAUZE/BANDAGES/DRESSINGS) ×2 IMPLANT
BASKET HEART (ORDER IN 25'S) (MISCELLANEOUS) ×1
BASKET HEART (ORDER IN 25S) (MISCELLANEOUS) ×1 IMPLANT
BLADE SAW STERNAL (BLADE) ×2 IMPLANT
BLADE SURG 11 STRL SS (BLADE) ×1 IMPLANT
BLADE SURG ROTATE 9660 (MISCELLANEOUS) IMPLANT
CANISTER SUCTION 2500CC (MISCELLANEOUS) ×2 IMPLANT
CANNULA GUNDRY RCSP 15FR (MISCELLANEOUS) IMPLANT
CARDIAC SUCTION (MISCELLANEOUS) ×2 IMPLANT
CATH ROBINSON RED A/P 18FR (CATHETERS) ×4 IMPLANT
CATH THORACIC 28FR (CATHETERS) ×2 IMPLANT
CATH THORACIC 28FR RT ANG (CATHETERS) IMPLANT
CATH THORACIC 36FR (CATHETERS) ×2 IMPLANT
CATH THORACIC 36FR RT ANG (CATHETERS) ×2 IMPLANT
CLIP FOGARTY SPRING 6M (CLIP) IMPLANT
CLIP RETRACTION 3.0MM CORONARY (MISCELLANEOUS) ×1 IMPLANT
CLIP TI MEDIUM 24 (CLIP) IMPLANT
CLIP TI WIDE RED SMALL 24 (CLIP) ×1 IMPLANT
CLOTH BEACON ORANGE TIMEOUT ST (SAFETY) ×2 IMPLANT
COVER SURGICAL LIGHT HANDLE (MISCELLANEOUS) ×4 IMPLANT
CRADLE DONUT ADULT HEAD (MISCELLANEOUS) ×2 IMPLANT
DRAPE CARDIOVASCULAR INCISE (DRAPES) ×2
DRAPE SLUSH MACHINE 52X66 (DRAPES) IMPLANT
DRAPE SLUSH/WARMER DISC (DRAPES) IMPLANT
DRAPE SRG 135X102X78XABS (DRAPES) ×1 IMPLANT
DRSG COVADERM 4X14 (GAUZE/BANDAGES/DRESSINGS) ×2 IMPLANT
ELECT CAUTERY BLADE 6.4 (BLADE) ×4 IMPLANT
ELECT REM PT RETURN 9FT ADLT (ELECTROSURGICAL) ×4
ELECTRODE REM PT RTRN 9FT ADLT (ELECTROSURGICAL) ×2 IMPLANT
GLOVE BIO SURGEON STRL SZ 6 (GLOVE) ×2 IMPLANT
GLOVE BIO SURGEON STRL SZ 6.5 (GLOVE) ×12 IMPLANT
GLOVE BIO SURGEON STRL SZ7 (GLOVE) IMPLANT
GLOVE BIO SURGEON STRL SZ7.5 (GLOVE) IMPLANT
GLOVE BIOGEL PI IND STRL 6 (GLOVE) IMPLANT
GLOVE BIOGEL PI IND STRL 6.5 (GLOVE) ×2 IMPLANT
GLOVE BIOGEL PI IND STRL 7.0 (GLOVE) ×2 IMPLANT
GLOVE BIOGEL PI INDICATOR 6 (GLOVE)
GLOVE BIOGEL PI INDICATOR 6.5 (GLOVE) ×2
GLOVE BIOGEL PI INDICATOR 7.0 (GLOVE) ×2
GLOVE EUDERMIC 7 POWDERFREE (GLOVE) ×6 IMPLANT
GLOVE ORTHO TXT STRL SZ7.5 (GLOVE) IMPLANT
GLOVE SURG EUDERMIC 8 LTX PF (GLOVE) ×4 IMPLANT
GOWN BRE IMP SLV AUR XL STRL (GOWN DISPOSABLE) ×10 IMPLANT
GOWN PREVENTION PLUS XLARGE (GOWN DISPOSABLE) ×4 IMPLANT
GOWN STRL NON-REIN LRG LVL3 (GOWN DISPOSABLE) ×10 IMPLANT
HEMOSTAT POWDER SURGIFOAM 1G (HEMOSTASIS) ×4 IMPLANT
HEMOSTAT SURGICEL 2X14 (HEMOSTASIS) ×2 IMPLANT
INSERT FOGARTY 61MM (MISCELLANEOUS) IMPLANT
INSERT FOGARTY XLG (MISCELLANEOUS) IMPLANT
KIT BASIN OR (CUSTOM PROCEDURE TRAY) ×2 IMPLANT
KIT CATH CPB BARTLE (MISCELLANEOUS) ×2 IMPLANT
KIT ROOM TURNOVER OR (KITS) ×2 IMPLANT
KIT SUCTION CATH 14FR (SUCTIONS) ×2 IMPLANT
KIT VASOVIEW W/TROCAR VH 2000 (KITS) ×2 IMPLANT
NS IRRIG 1000ML POUR BTL (IV SOLUTION) ×12 IMPLANT
PACK OPEN HEART (CUSTOM PROCEDURE TRAY) ×2 IMPLANT
PAD ARMBOARD 7.5X6 YLW CONV (MISCELLANEOUS) ×4 IMPLANT
PENCIL BUTTON HOLSTER BLD 10FT (ELECTRODE) ×2 IMPLANT
PUNCH AORTIC ROTATE 4.0MM (MISCELLANEOUS) IMPLANT
PUNCH AORTIC ROTATE 4.5MM 8IN (MISCELLANEOUS) ×2 IMPLANT
PUNCH AORTIC ROTATE 5MM 8IN (MISCELLANEOUS) IMPLANT
SET CARDIOPLEGIA MPS 5001102 (MISCELLANEOUS) ×2 IMPLANT
SLEEVE SURGEON STRL (DRAPES) ×1 IMPLANT
SOLUTION ANTI FOG 6CC (MISCELLANEOUS) IMPLANT
SPONGE GAUZE 4X4 12PLY (GAUZE/BANDAGES/DRESSINGS) ×4 IMPLANT
SPONGE GAUZE 4X4 FOR O.R. (GAUZE/BANDAGES/DRESSINGS) ×4 IMPLANT
SPONGE INTESTINAL PEANUT (DISPOSABLE) IMPLANT
SPONGE LAP 18X18 X RAY DECT (DISPOSABLE) ×2 IMPLANT
SPONGE LAP 4X18 X RAY DECT (DISPOSABLE) ×2 IMPLANT
STOPCOCK 4 WAY LG BORE MALE ST (IV SETS) ×2 IMPLANT
SUT BONE WAX W31G (SUTURE) ×2 IMPLANT
SUT MNCRL AB 4-0 PS2 18 (SUTURE) ×2 IMPLANT
SUT PROLENE 3 0 SH DA (SUTURE) IMPLANT
SUT PROLENE 3 0 SH1 36 (SUTURE) IMPLANT
SUT PROLENE 4 0 RB 1 (SUTURE)
SUT PROLENE 4 0 SH DA (SUTURE) IMPLANT
SUT PROLENE 4-0 RB1 .5 CRCL 36 (SUTURE) IMPLANT
SUT PROLENE 5 0 C 1 36 (SUTURE) IMPLANT
SUT PROLENE 6 0 C 1 30 (SUTURE) ×6 IMPLANT
SUT PROLENE 6 0 CC (SUTURE) IMPLANT
SUT PROLENE 7 0 BV 1 (SUTURE) IMPLANT
SUT PROLENE 7 0 BV1 MDA (SUTURE) ×2 IMPLANT
SUT PROLENE 7.0 RB 3 (SUTURE) ×2 IMPLANT
SUT PROLENE 8 0 BV175 6 (SUTURE) IMPLANT
SUT SILK  1 MH (SUTURE)
SUT SILK 1 MH (SUTURE) IMPLANT
SUT SILK 2 0 SH CR/8 (SUTURE) ×2 IMPLANT
SUT SILK 3 0 SH CR/8 (SUTURE) IMPLANT
SUT STEEL STERNAL CCS#1 18IN (SUTURE) IMPLANT
SUT STEEL SZ 6 DBL 3X14 BALL (SUTURE) IMPLANT
SUT VIC AB 1 CTX 36 (SUTURE) ×4
SUT VIC AB 1 CTX36XBRD ANBCTR (SUTURE) ×2 IMPLANT
SUT VIC AB 2-0 CT1 27 (SUTURE) ×2
SUT VIC AB 2-0 CT1 TAPERPNT 27 (SUTURE) ×1 IMPLANT
SUT VIC AB 2-0 CTX 27 (SUTURE) IMPLANT
SUT VIC AB 3-0 SH 27 (SUTURE)
SUT VIC AB 3-0 SH 27X BRD (SUTURE) IMPLANT
SUT VIC AB 3-0 X1 27 (SUTURE) IMPLANT
SUT VICRYL 4-0 PS2 18IN ABS (SUTURE) IMPLANT
SUTURE E-PAK OPEN HEART (SUTURE) ×2 IMPLANT
SYSTEM SAHARA CHEST DRAIN ATS (WOUND CARE) ×2 IMPLANT
TOWEL OR 17X24 6PK STRL BLUE (TOWEL DISPOSABLE) ×2 IMPLANT
TOWEL OR 17X26 10 PK STRL BLUE (TOWEL DISPOSABLE) ×4 IMPLANT
TRAY CATH LUMEN 1 20CM STRL (SET/KITS/TRAYS/PACK) ×2 IMPLANT
TRAY FOLEY IC TEMP SENS 14FR (CATHETERS) ×2 IMPLANT
TUBE SUCT INTRACARD DLP 20F (MISCELLANEOUS) ×2 IMPLANT
TUBING INSUFFLATION 10FT LAP (TUBING) ×2 IMPLANT
UNDERPAD 30X30 INCONTINENT (UNDERPADS AND DIAPERS) ×2 IMPLANT
WATER STERILE IRR 1000ML POUR (IV SOLUTION) ×4 IMPLANT

## 2011-06-14 NOTE — OR Nursing (Signed)
12:10pm - called vol. Desk to inform family off pump, also made 1st call to SICU

## 2011-06-14 NOTE — Transfer of Care (Signed)
Immediate Anesthesia Transfer of Care Note  Patient: [name]  Procedure(s) Performed:  CORONARY ARTERY BYPASS GRAFTING (CABG)  Pt to SICU Bed 2312  Anesthesia Type: General  Level of Consciousness: unresponsive  Airway & Oxygen Therapy: Patient remains intubated per anesthesia plan  Post-op Assessment: Report given to PACU RN and Post -op Vital signs reviewed and stable  Post vital signs: Reviewed and stable Filed Vitals:   06/14/11 0452  BP: 113/67  Pulse: 91  Temp: 37.3 C  Resp: 20    Complications: No apparent anesthesia complications

## 2011-06-14 NOTE — Procedures (Signed)
Extubation Procedure Note  Patient Details:   Name: [name] DOB: 1940/11/28 MRN: [id number]   Airway Documentation:     Evaluation  O2 sats: stable throughout Complications: No apparent complications Patient did tolerate procedure well. Bilateral Breath Sounds: Coarse crackles Suctioning: Airway Yes Extubated per Protocol.  NIF -32, VC 800 ml.  Positive leak test.  Pt able to vocalize.  No stridor noted.  Will continue to monitor. Lysbeth Penner [facility] 06/14/2011, 6:46 PM

## 2011-06-14 NOTE — OR Nursing (Signed)
12:35pm 2nd call made to SICU

## 2011-06-14 NOTE — Anesthesia Procedure Notes (Signed)
Procedures 0920: Swan pulled back 2cm to 46cm appeared to wedge. Good waveform afterwards. CE

## 2011-06-14 NOTE — Anesthesia Postprocedure Evaluation (Signed)
  Anesthesia Post-op Note  Patient: [name]  Procedure(s) Performed:  CORONARY ARTERY BYPASS GRAFTING (CABG)  Patient Location: PACU and SICU  Anesthesia Type: General  Level of Consciousness: sedated  Airway and Oxygen Therapy: Patient remains intubated per anesthesia plan  Post-op Pain: mild  Post-op Assessment: Post-op Vital signs reviewed  Post-op Vital Signs: stable  Complications: No apparent anesthesia complications

## 2011-06-14 NOTE — Anesthesia Preprocedure Evaluation (Addendum)
Anesthesia Evaluation  Patient identified by MRN, date of birth, ID band Patient awake    Reviewed: Allergy & Precautions, H&P , NPO status , Patient's Chart, lab work & pertinent test results  History of Anesthesia Complications Negative for: history of anesthetic complications  Airway Mallampati: II  Neck ROM: Limited    Dental  (+) Edentulous Upper and Edentulous Lower   Pulmonary pneumonia ,          Cardiovascular hypertension, Pt. on medications + Past MI     Neuro/Psych    GI/Hepatic Neg liver ROS, hiatal hernia,   Endo/Other    Renal/GU Renal insuff     Musculoskeletal  (+) Arthritis -, on steriods ,    Abdominal   Peds  Hematology Hx of DVT   Anesthesia Other Findings   Reproductive/Obstetrics Hx of Hyster                           Anesthesia Physical Anesthesia Plan  ASA: III  Anesthesia Plan: General   Post-op Pain Management:    Induction: Intravenous  Airway Management Planned: Oral ETT  Additional Equipment: Arterial line and PA Cath  Intra-op Plan:   Post-operative Plan: Post-operative intubation/ventilation  Informed Consent:   Plan Discussed with: Anesthesiologist, CRNA and Surgeon  Anesthesia Plan Comments:         Anesthesia Quick Evaluation

## 2011-06-14 NOTE — Progress Notes (Signed)
Patient ID: [name], female   DOB: [birth year], 71 y.o.   MRN: [id number] S/p cabg X 3 Extubated Good cardiac index Minimal CT output

## 2011-06-14 NOTE — Preoperative (Signed)
Beta Blockers   Reason not to administer Beta Blockers:Not Applicable 

## 2011-06-14 NOTE — Brief Op Note (Signed)
06/01/2011 - 06/14/2011  1:15 PM  PATIENT:  [name]  71 y.o. female  PRE-OPERATIVE DIAGNOSIS:  Severe multivessel coronary occlusive disease  POST-OPERATIVE DIAGNOSIS:  Severe multivessel coronary occlusive disease  PROCEDURE:  Procedure(s): CORONARY ARTERY BYPASS GRAFTING (CABG) x 3 RIMA to LAD SVG to Ramus SVG to RCA  EVH right leg.  SURGEON:  Surgeon(s): Alleen Borne, MD  PHYSICIAN ASSISTANT:   ASSISTANTS:Ronnie Scott, SA  ANESTHESIA:   general  EBL:  Total I/O In: 3544 [I.V.:3000; Blood:544] Out: 2525 [Urine:1025; Blood:1500]  BLOOD ADMINISTERED:1 PLTS   PATIENT DISPOSITION:  ICU - intubated and hemodynamically stable.

## 2011-06-15 ENCOUNTER — Encounter (HOSPITAL_COMMUNITY): Payer: Self-pay | Admitting: Surgery

## 2011-06-15 ENCOUNTER — Inpatient Hospital Stay (HOSPITAL_COMMUNITY): Payer: Medicare Other

## 2011-06-15 DIAGNOSIS — K8020 Calculus of gallbladder without cholecystitis without obstruction: Secondary | ICD-10-CM

## 2011-06-15 LAB — BASIC METABOLIC PANEL
BUN: 13 mg/dL (ref 6–23)
CO2: 20 mEq/L (ref 19–32)
CO2: 22 mEq/L (ref 19–32)
Calcium: 8 mg/dL — ABNORMAL LOW (ref 8.4–10.5)
Calcium: 8.4 mg/dL (ref 8.4–10.5)
Creatinine, Ser: 0.89 mg/dL (ref 0.50–1.10)
Creatinine, Ser: 0.83 mg/dL (ref 0.50–1.10)
GFR calc non Af Amer: 70 mL/min — ABNORMAL LOW (ref >90)
Glucose, Bld: 106 mg/dL — ABNORMAL HIGH (ref 70–99)
Sodium: 128 mEq/L — ABNORMAL LOW (ref 135–145)

## 2011-06-15 LAB — GLUCOSE, CAPILLARY
Glucose-Capillary: 92 mg/dL (ref 70–99)
Glucose-Capillary: 145 mg/dL — ABNORMAL HIGH (ref 70–99)
Glucose-Capillary: 181 mg/dL — ABNORMAL HIGH (ref 70–99)
Glucose-Capillary: 168 mg/dL — ABNORMAL HIGH (ref 70–99)

## 2011-06-15 LAB — CBC
HCT: 32.4 % — ABNORMAL LOW (ref 36.0–46.0)
Hemoglobin: 10.6 g/dL — ABNORMAL LOW (ref 12.0–15.0)
MCH: 30 pg (ref 26.0–34.0)
MCH: 30.4 pg (ref 26.0–34.0)
MCHC: 32.7 g/dL (ref 30.0–36.0)
MCV: 92.4 fL (ref 78.0–100.0)
MCV: 92.8 fL (ref 78.0–100.0)
Platelets: 169 10*3/uL (ref 150–400)
RBC: 3.17 MIL/uL — ABNORMAL LOW (ref 3.87–5.11)
RDW: 14.4 % (ref 11.5–15.5)

## 2011-06-15 LAB — MAGNESIUM: Magnesium: 2.3 mg/dL (ref 1.5–2.5)

## 2011-06-15 MED ORDER — SODIUM CHLORIDE 0.9 % IV SOLN: INTRAVENOUS | Status: DC

## 2011-06-15 MED ORDER — INSULIN ASPART 100 UNIT/ML ~~LOC~~ SOLN
0.0000 [IU] | Freq: Once | SUBCUTANEOUS | Status: DC
  Filled 2011-06-15: qty 3

## 2011-06-15 MED ORDER — INSULIN ASPART 100 UNIT/ML ~~LOC~~ SOLN
0.0000 [IU] | SUBCUTANEOUS | Status: DC
  Administered 2011-06-15: 2 [IU] via SUBCUTANEOUS

## 2011-06-15 MED ORDER — DEXTROSE 50 % IV SOLN: 25.0000 mL | INTRAVENOUS | Status: DC | PRN

## 2011-06-15 MED ORDER — METOCLOPRAMIDE HCL 5 MG/ML IJ SOLN
5.0000 mg | Freq: Four times a day (QID) | INTRAMUSCULAR | Status: DC
  Administered 2011-06-15 (×2): 5 mg via INTRAVENOUS
  Filled 2011-06-15 (×4): qty 1

## 2011-06-15 MED ORDER — INSULIN ASPART 100 UNIT/ML ~~LOC~~ SOLN
0.0000 [IU] | SUBCUTANEOUS | Status: DC
  Administered 2011-06-16: 2 [IU] via SUBCUTANEOUS
  Administered 2011-06-16: 4 [IU] via SUBCUTANEOUS
  Administered 2011-06-16: 2 [IU] via SUBCUTANEOUS
  Administered 2011-06-16: 4 [IU] via SUBCUTANEOUS
  Filled 2011-06-15: qty 3

## 2011-06-15 MED ORDER — INSULIN ASPART 100 UNIT/ML ~~LOC~~ SOLN
0.0000 [IU] | SUBCUTANEOUS | Status: DC
  Administered 2011-06-15: 4 [IU] via SUBCUTANEOUS
  Administered 2011-06-15: 16 [IU] via SUBCUTANEOUS

## 2011-06-15 MED ORDER — ENOXAPARIN SODIUM 40 MG/0.4ML ~~LOC~~ SOLN
40.0000 mg | Freq: Every day | SUBCUTANEOUS | Status: DC
  Administered 2011-06-15 – 2011-06-25 (×11): 40 mg via SUBCUTANEOUS
  Filled 2011-06-15 (×12): qty 0.4

## 2011-06-15 MED ORDER — INSULIN GLARGINE 100 UNIT/ML ~~LOC~~ SOLN
20.0000 [IU] | Freq: Every day | SUBCUTANEOUS | Status: DC
  Administered 2011-06-15 – 2011-06-19 (×5): 20 [IU] via SUBCUTANEOUS

## 2011-06-15 MED ORDER — INSULIN ASPART 100 UNIT/ML ~~LOC~~ SOLN
0.0000 [IU] | SUBCUTANEOUS | Status: AC
  Filled 2011-06-15: qty 3

## 2011-06-15 MED ORDER — METOCLOPRAMIDE HCL 5 MG/ML IJ SOLN
10.0000 mg | Freq: Four times a day (QID) | INTRAMUSCULAR | Status: AC
  Administered 2011-06-16 (×4): 10 mg via INTRAVENOUS
  Filled 2011-06-15 (×4): qty 2

## 2011-06-15 NOTE — Progress Notes (Signed)
UR Completed.  Rodriques Badie Jane [phone] 06/15/2011  

## 2011-06-15 NOTE — Progress Notes (Signed)
Physical Therapy Evaluation Patient Details Name: [name] MRN: [id number] DOB: 1940-12-11 Today's Date: 06/15/2011  Problem List:  Patient Active Problem List  Diagnoses  . Troponin level elevated  . Acute myocardial infarction, subendocardial infarction, subsequent episode of care  . Acute systolic congestive heart failure  . Acute renal failure  . Hypokalemia  . Hypomagnesemia  . Dextrocardia    Past Medical History:  Past Medical History  Diagnosis Date  . Arthritis   . Neuropathy   . Reflux   . Hypertension   . Elevated cholesterol   . Situs inversus   . Dextrocardia   . Fracture 05/24/2011    right; "did not have surgery"  . DVT of leg (deep venous thrombosis) ~2006    left  . Pneumonia 06/13/11    "couple times; long time ago"  . Myocardial infarction 06/01/11  . Chest pain   . Chronic bronchitis   . Blood transfusion   . Anemia   . H/O hiatal hernia    Past Surgical History:  Past Surgical History  Procedure Date  . Ercp 06/03/2011    Procedure: ENDOSCOPIC RETROGRADE CHOLANGIOPANCREATOGRAPHY (ERCP);  Surgeon: Petra Kuba, MD;  Location: [facility] OR;  Service: Endoscopy;  Laterality: N/A;  . Vaginal hysterectomy 1970's  . Cardiac catheterization 06/11/11  . Coronary artery bypass graft 06/14/2011    Procedure: CORONARY ARTERY BYPASS GRAFTING (CABG);  Surgeon: Alleen Borne, MD;  Location: [facility] OR;  Service: Open Heart Surgery;  Laterality: N/A;    PT Assessment/Plan/Recommendation PT Assessment Clinical Impression Statement: 71 y/o female with complicated admission to [facility]. Originally presented s/p fall and elbow fracture with plans for cholesystectomy. Since admission she developed cardiac complications and is now s/p CABG POD #1. PT was seeing pt prior to her surgery and have now been reconsulted. PT eval complete and pt demonstrating generalized immobility and weakness following surgery and all of her complications. Will benefit physical therapy in the acute  stting for these and the following problem list as well as to maximize her independence and safety upon d/c home. Will benefit from OT consult as well as continued therapies from HHPT.  PT Recommendation/Assessment: Patient will need skilled PT in the acute care venue PT Problem List: Decreased strength;Decreased range of motion;Decreased activity tolerance;Decreased balance;Decreased mobility;Cardiopulmonary status limiting activity;Obesity;Pain PT Therapy Diagnosis : Difficulty walking;Abnormality of gait;Generalized weakness;Acute pain PT Plan PT Frequency: Min 3X/week PT Treatment/Interventions: DME instruction;Gait training;Stair training;Functional mobility training;Neuromuscular re-education;Therapeutic exercise;Balance training;Therapeutic activities;Patient/family education PT Recommendation Recommendations for Other Services: OT consult Follow Up Recommendations: Inpatient Rehab Equipment Recommended: Defer to next venue (TBD) PT Goals  Acute Rehab PT Goals PT Goal Formulation: With patient Pt will go Supine/Side to Sit: with min assist PT Goal: Supine/Side to Sit - Progress: Progressing toward goal Pt will go Sit to Supine/Side: with min assist PT Goal: Sit to Supine/Side - Progress: Progressing toward goal Pt will go Sit to Stand: with min assist PT Goal: Sit to Stand - Progress: Progressing toward goal Pt will go Stand to Sit: with min assist PT Goal: Stand to Sit - Progress: Progressing toward goal Pt will Transfer Bed to Chair/Chair to Bed: with min assist PT Transfer Goal: Bed to Chair/Chair to Bed - Progress: Progressing toward goal Pt will Stand: with min assist PT Goal: Stand - Progress: Progressing toward goal Pt will Ambulate: with min assist;with least restrictive assistive device PT Goal: Ambulate - Progress: Progressing toward goal Pt will Go Up / Down Stairs: 3-5 stairs PT  Goal: Up/Down Stairs - Progress: Progressing toward goal  PT  Evaluation Precautions/Restrictions  Precautions Precautions: Fall Restrictions Sternal precautions RUE Weight Bearing: Non weight bearing Other Position/Activity Restrictions: ROM out of splint per orders Prior Functioning  Home Living Lives With: Spouse Receives Help From: Family Type of Home: Apartment Home Layout: One level Home Access: Stairs to enter Entrance Stairs-Rails: Can reach both Entrance Stairs-Number of Steps: 4 Bathroom Shower/Tub: Associate Professor: Yes How Accessible: Accessible via walker Home Adaptive Equipment: Shower chair with back;Straight cane Prior Function Level of Independence: Independent with basic ADLs Driving: Yes Vocation: Retired Financial risk analyst Arousal/Alertness: Awake/alert Overall Cognitive Status: Appears within functional limits for tasks assessed Orientation Level: Oriented X4 Sensation/Coordination Sensation Light Touch: Appears Intact Extremity Assessment RUE PROM (degrees) RUE Overall PROM Comments: elbow extension limited to about 10-15 degrees from full extension and flexion only to about 90 degrees until pt is painful; Pt performed AAROM flex/ext elbow with shoulder in flexed position to limit gravity; pt about 10=15 degrees from full extension before painful, and only able to flex elbow about 90 degrees before she gets painful; we also did AA horizontal shoulder abduction/adduction and shoulder to encourage shoulder movement as she is complaining of shoulder pain; during shoulder flexion with elbow supported there was an audible click but I was unable to dtermine were I heard this, the pt did not complain of any pain so we rested her arm on the pillow LUE Assessment LUE Assessment: Within Functional Limits RLE Assessment RLE Assessment: Within Functional Limits LLE Assessment LLE Assessment: Within Functional Limits Mobility (including Balance) Bed Mobility Bed Mobility:  Yes Rolling Left: 1: +2 Total assist Rolling Left Details (indicate cue type and reason): +2totalpt40%; cues to bend RLE and push off to initiate rolling; pt holding cardiac pillow through roll; use of pad for follow through onto her left side Left Sidelying to Sit: 1: +1 Total assist Left Sidelying to Sit Details (indicate cue type and reason): +2totalpt20% Sitting - Scoot to Edge of Bed: 3: Mod assist Transfers Sit to Stand: 1: +2 Total assist Sit to Stand Details (indicate cue type and reason): +2totalp70%; cues for sequencing and anterior translation as well as follow through Stand to Sit: 3: Mod assist Stand Pivot Transfers: 1: +2 Total assist Stand Pivot Transfer Details (indicate cue type and reason): +2totalpt70%; pt with complaints that he legs were shaky; hugging the cardiac pill throughout transfer Ambulation/Gait Ambulation/Gait: No    Exercise  General Exercises - Upper Extremity Shoulder Flexion: AAROM;Right;5 reps;Seated (with elbow supported) Shoulder Horizontal ABduction: AAROM;Right;5 reps Shoulder Horizontal ADduction: AAROM;Right;5 reps End of Session PT - End of Session Equipment Utilized During Treatment: Gait belt Activity Tolerance: Patient tolerated treatment well;Patient limited by fatigue Patient left: in chair Nurse Communication: Mobility status for transfers;Mobility status for ambulation General Behavior During Session: [facility] for tasks performed Cognition: [facility] for tasks performed  [facility] HELEN 06/15/2011, 1:54 PM

## 2011-06-15 NOTE — Progress Notes (Signed)
Subjective:  Doing well post CABG yesterday. Rhythm is NSR.  BP stable on low dose pressors.  Mild nausea.  Objective:  Vital Signs in the last 24 hours: Temp:  [96.6 F (35.9 C)-99.1 F (37.3 C)] 98.6 F (37 C) (01/11 0800) Pulse Rate:  [88-105] 97  (01/11 0800) Resp:  [10-23] 16  (01/11 0800) BP: (84-109)/(42-56) 103/56 mmHg (01/11 0800) SpO2:  [90 %-100 %] 95 % (01/11 0800) Arterial Line BP: (82-137)/(49-77) 137/60 mmHg (01/11 0530) FiO2 (%):  [39.9 %-50.7 %] 39.9 % (01/10 1845) Weight:  [229 lb 8 oz (104.1 kg)] 229 lb 8 oz (104.1 kg) (01/11 0609)  Intake/Output from previous day: 01/10 0701 - 01/11 0700 In: 6888.3 [P.O.:210; I.V.:4674.3; Blood:544; NG/GT:30; IV Piggyback:1430] Out: 4900 [Urine:2780; Emesis/NG output:150; Blood:1500; Chest Tube:470] Intake/Output from this shift: Total I/O In: 49 [I.V.:49] Out: 75 [Urine:75]     . acetaminophen (TYLENOL) oral liquid 160 mg/5 mL  650 mg Per Tube NOW   Or  . acetaminophen  650 mg Rectal NOW  . acetaminophen  1,000 mg Oral Q6H   Or  . acetaminophen (TYLENOL) oral liquid 160 mg/5 mL  975 mg Per Tube Q6H  . aspirin EC  325 mg Oral Daily   Or  . aspirin  324 mg Per Tube Daily  . bisacodyl  10 mg Oral Daily   Or  . bisacodyl  10 mg Rectal Daily  . ciprofloxacin  400 mg Intravenous Q12H  . docusate sodium  200 mg Oral Daily  . enoxaparin  40 mg Subcutaneous QHS  . famotidine (PEPCID) IV  20 mg Intravenous Q12H  . heparin-papaverine-plasmalyte irrigation   Irrigation To OR  . insulin aspart  0-24 Units Subcutaneous Q2H  . insulin aspart  0-24 Units Subcutaneous Q4H  . insulin regular  0-10 Units Intravenous TID WC  . magnesium sulfate  4 g Intravenous Once  . metoCLOPramide (REGLAN) injection  5 mg Intravenous Q6H  . metronidazole  500 mg Intravenous Q6H  . pantoprazole  40 mg Oral Q1200  . potassium chloride  10 mEq Intravenous Q1 Hr x 3  . predniSONE  5 mg Oral Q breakfast  . sodium chloride  3 mL Intravenous  Q12H  . vancomycin (VANCOCIN) IVPB 1000 mg/100 mL central line  1,000 mg Intravenous Once  . DISCONTD: acetaminophen (TYLENOL) oral liquid 160 mg/5 mL  975 mg Per Tube Q6H  . DISCONTD: acetaminophen  1,000 mg Oral Q6H  . DISCONTD: albuterol  2.5 mg Nebulization Q6H  . DISCONTD: aminocaproic acid (AMICAR) for OHS   Intravenous To OR  . DISCONTD: aminocaproic acid (AMICAR) IVPB  5 g Intravenous To OR  . DISCONTD: aspirin EC  81 mg Oral Daily  . DISCONTD: ciprofloxacin  400 mg Intravenous Q12H  . DISCONTD: dexmedetomidine (PRECEDEX) IV infusion  0.4-1.2 mcg/kg/hr Intravenous To OR  . DISCONTD: dexmedetomidine (PRECEDEX) IV infusion for high rates  0.1-0.7 mcg/kg/hr Intravenous To OR  . DISCONTD: dexmedetomidine (PRECEDEX) IV infusion for high rates  0.1-0.7 mcg/kg/hr Intravenous To OR  . DISCONTD: DOPamine  2-20 mcg/kg/min Intravenous To OR  . DISCONTD: epinephrine  0.5-20 mcg/min Intravenous To OR  . DISCONTD: heparin-papaverine-plasmalyte irrigation   Irrigation To OR  . DISCONTD: insulin aspart  0-15 Units Subcutaneous Q4H  . DISCONTD: insulin aspart  0-24 Units Subcutaneous Q4H  . DISCONTD: insulin glargine  5 Units Subcutaneous QHS  . DISCONTD: insulin (NOVOLIN-R) infusion   Intravenous To OR  . DISCONTD: ipratropium  0.5 mg Nebulization Q6H  .  DISCONTD: magnesium sulfate  40 mEq Other To OR  . DISCONTD: metoprolol tartrate  12.5 mg Per Tube BID  . DISCONTD: metoprolol tartrate  12.5 mg Oral BID  . DISCONTD: metoprolol tartrate  12.5 mg Oral BID  . DISCONTD: nitroGLYCERIN  2-200 mcg/min Intravenous To OR  . DISCONTD: pantoprazole  40 mg Oral QHS  . DISCONTD: phenylephrine (NEO-SYNEPHRINE) Adult infusion  30-200 mcg/min Intravenous To OR  . DISCONTD: potassium chloride  80 mEq Other To OR      . sodium chloride 20 mL/hr (06/14/11 1519)  . sodium chloride 20 mL (06/14/11 1400)  . sodium chloride    . dexmedetomidine (PRECEDEX) IV infusion 0.1 mcg/kg/hr (06/14/11 1700)  . insulin  (NOVOLIN-R) infusion 2.9 Units/hr (06/14/11 1342)  . lactated ringers 20 mL (06/14/11 2329)  . nitroGLYCERIN 16.67 mcg/min (06/14/11 1400)  . phenylephrine (NEO-SYNEPHRINE) Adult infusion 12 mcg/min (06/15/11 0000)  . DISCONTD: sodium chloride 20 mL/hr at 06/11/11 0409  . DISCONTD: lactated ringers 50 mL/hr at 06/13/11 1909    Physical Exam: The patient appears to be in no distress. Alert, oriented. Chest clear anteriorly. Heart no gallop or rub. Dextrocardia. Extremities no edema.    Lab Results:  Basename 06/15/11 0340 06/14/11 1845  WBC 13.9* 14.0*  HGB 9.5* 9.5*  PLT 169 156    Basename 06/15/11 0340 06/14/11 1845 06/14/11 1844 06/14/11 0411  NA 133* -- 135 --  K 4.3 -- 4.6 --  CL 104 -- 105 --  CO2 20 -- -- 25  GLUCOSE 106* -- 95 --  BUN 13 -- 13 --  CREATININE 0.89 0.97 -- --   No results found for this basename: TROPONINI:2,CK,MB:2 in the last 72 hours Hepatic Function Panel  Basename 06/14/11 0411  PROT 6.0  ALBUMIN 2.5*  AST 13  ALT 13  ALKPHOS 104  BILITOT 0.7  BILIDIR --  IBILI --    Basename 06/14/11 0400  CHOL 124   No results found for this basename: PROTIME in the last 72 hours  Imaging: Imaging results have been reviewed  Cardiac Studies:  Assessment/Plan:  Patient Active Hospital Problem List:  CAD S/P CABG.       Doing well. Situs inversus with dextrocardia.   LOS: 14 days    Cassell Clement 06/15/2011, 8:57 AM

## 2011-06-15 NOTE — Op Note (Signed)
NAMEHONESTIE, KULIK               ACCOUNT NO.:  [id number]  MEDICAL RECORD NO.:  [id number]  LOCATION:  2312                         FACILITY:  [facility]  PHYSICIAN:  Evelene Croon, M.D.     DATE OF BIRTH:  04/27/1941  DATE OF PROCEDURE:  06/14/2011 DATE OF DISCHARGE:                              OPERATIVE REPORT   PREOPERATIVE DIAGNOSIS:  Severe multivessel coronary artery disease with unstable angina and non ST-segment elevation myocardial infarction.  POSTOPERATIVE DIAGNOSIS:  Severe multivessel coronary artery disease with unstable angina and non ST-segment elevation myocardial infarction.  OPERATIVE PROCEDURE:  Median sternotomy, extracorporeal circulation, coronary artery bypass graft surgery x3 using a right internal mammary artery graft to the left anterior descending coronary artery, with a saphenous vein graft to the ramus coronary artery, and a saphenous vein graft to the right coronary artery.  Endoscopic vein harvesting from the right leg.  Insertion of left femoral arterial line for blood pressure monitoring.  SURGEON:  Evelene Croon, MD  ASSISTANT:  Al Corpus SA  ANESTHESIA:  General endotracheal.  CLINICAL HISTORY:  This patient is a 71 year old woman who presented to the emergency department the end of December with a fever up to 102, chills, nausea, vomiting, and upper abdominal pain and was found to have choledocholithiasis with cholangitis.  She had evidence of sepsis on admission with hypotension requiring fluid resuscitation.  She subsequently underwent ERCP with sphincterotomy and stone extraction. She developed shortness of breath and appeared to be in pulmonary edema on chest x-ray and cardiac enzymes were positive.  She ruled in for non ST-segment elevation MI.  Pharmacologic stress test showed dilatation of left ventricle and left ventricular dysfunction with ejection fraction of 20%.  The patient was found to have situs inversus with  dextrocardia. There was probable apical infarct with possible area of reversibility involving the distal anterolateral wall concerning for ischemia.  She underwent cardiac catheterization which confirmed dextrocardia.  The left main coronary artery appeared to have no disease.  The left main appeared to divide into a moderate-sized ramus artery branch as well as a small to moderate-sized left circumflex system.  This ramus branch had about 90% midvessel stenosis.  Left circumflex had 50-60% diffuse proximal stenosis that was not significant.  The right coronary artery was a large vessel with 80% mid vessel stenosis.  There was also a moderate-sized vessel that arose from the right coronary cusp and supplied at least a portion of the anterior wall and looked like it was the true LAD.  This had a 99% proximal stenosis.  Left ventricular ejection fraction by catheterization was estimated at 40-45%.  She subsequently had a CT angiogram of the heart to better evaluate her coronary morphology.  After review of the catheterization and examination of the patient, it was felt that coronary artery bypass graft surgery was probably the best treatment for her.  We considered percutaneous intervention but did not feel that was ideal given her coronary morphology and the fact that she was going to require cholecystectomy in not too distant future.  She continued to have some substernal chest discomfort and nausea which may have been ischemic.  I decided to proceed with coronary  artery bypass surgery today.  I discussed the operative procedure with the patient and her family.  We discussed alternatives, benefits, and risks including but not limited to bleeding, blood transfusion, infection, stroke, myocardial infarction, graft failure, and death.  She understood all this and agreed to proceed.  OPERATIVE PROCEDURE:  The patient was taken to the operative room and placed on table in supine position.   After induction of general endotracheal anesthesia, a Foley catheter was placed in bladder using sterile technique.  Then, the chest, abdomen, and both lower extremities were prepped and draped in the usual sterile manner.  The patient had previous right radial head fracture but orthopedic surgery had seen her preoperatively and felt that she could be out of her splint doing range of motion exercises, and therefore we removed her splint and placed her arm at her right side and made sure it was well padded.  The splint was returned to her arm at the end of surgery.  Then, the chest, abdomen, and both lower extremities were prepped and draped in the usual sterile manner.  The chest was entered through a median sternotomy incision and the pericardium was opened in midline.  Examination of the heart showed dextrocardia with all structures being a complete mirror image.  The ascending aorta was of normal size and had no palpable plaques in it.  Then, the right internal mammary artery was harvested from chest wall as a pedicle graft.  This was a medium-caliber vessel with excellent blood flow through it.  At the same time, a segment of greater saphenous vein was harvested from the right leg using endoscopic vein harvest technique.  This vein was of medium size and good quality.  Then, the patient was heparinized.  When an adequate ACT was obtained, the distal ascending aorta was cannulated using a 20-French aortic cannula for arterial inflow.  Venous outflow was achieved using a two- stage venous cannula for the right atrial appendage.  An antegrade cardioplegia and vent cannula was inserted in aortic root. The patient was placed on cardiopulmonary bypass and distal coronary artery was identified.  The LAD was located on the surface of the heart in the interventricular groove.  This was a small, thin-walled vessel that had no disease in it and was suitable for grafting.  The intermediate was  intramyocardial in its proximal extent but was located more distally where it was still fairly large vessel and was suitable for grafting.  The obtuse marginal branch was a small nongraftable vessel.  The right coronary artery was a large graftable vessel that had no disease in the mid or distal portion.  Then, the aorta was cross-clamped and 800 mL of cold blood antegrade cardioplegia was administered in the aortic root with quick arrest of the heart.  Systemic hypothermia to 32 degrees centigrade and topical hypothermic iced saline was used.  Temperature probe was placed in septum and insulating pad in the pericardium.  First distal anastomosis was performed to the right coronary artery. The internal diameter of this vessel was about 3 mm.  The conduit used was a segment of greater saphenous vein and the anastomosis was performed in an end-to-side manner using continuous 7-0 Prolene suture. Flow was noted through the graft and was excellent.  The second distal anastomosis was performed to the ramus branch.  The internal diameter of this vessel was about 1.75 mm.  The conduit used was a second segment of greater saphenous vein and the anastomosis was performed in an  end-to-side manner using continuous 7-0 Prolene suture. Flow was noted through the graft and was excellent.  Another dose of cardioplegia given down vein grafts and in aortic root.  Third distal anastomosis was performed to the mid portion of the LAD. The internal diameter of this vessel was about 1.6 mm.  The conduit used was a right internal mammary graft that was brought through an opening in the right pericardium anterior to the phrenic nerve.  It was anastomosed to the LAD in an end-to-side manner using continuous 8-0 Prolene suture.  The pedicle was sutured to the epicardium with 6-0 Prolene sutures.  The patient was then rewarmed to 37 degrees centigrade.  Another dose cardioplegia was given, and the two  proximal vein graft anastomoses were performed in the mid ascending aorta in an end-to-side manner using continuous 6-0 Prolene suture.  Then, the clamp was removed from mammary pedicle.  There was rapid warming of the ventricular septum and return of spontaneous ventricular fibrillation. Cross-clamp was removed with time of 85 minutes and the patient spontaneously converted to sinus rhythm.  The proximal and distal anastomoses appeared hemostatic and allowed the grafts satisfactory. Graft marker was placed around the proximal anastomoses.  Two temporary right ventricular and right atrial pacing wires placed and brought through the skin.  When the patient rewarmed to 37 degrees centigrade, she was weaned from cardiopulmonary bypass on no inotropic agents.  Total bypass time was 106 minutes.  Cardiac function appeared excellent with cardiac output of 4 L/minute.  Protamine was given and the venous and aortic cannulas were removed without difficulty.  Hemostasis was achieved.  Three chest tubes were placed with two in the posterior pericardium in right pleural space and one in anterior mediastinum.  The sternum was then closed with double #6 stainless steel wires.  Fascia closed with continuous #1 Vicryl suture.  Subcutaneous tissue was closed with continuous 2-0 Vicryl and the skin with a 3-0 Vicryl subcuticular closure.  The lower extremity vein harvest site was closed in layers in similar manner. Sponge, needle, and instrument counts were correct according to the scrub nurse.  Dry sterile dressing applied over the incision and around the chest tubes, which were hooked to Pleur-Evac suction.  The patient remained hemodynamically stable and transported to the SICU in guarded, but stable condition.     Evelene Croon, M.D.     BB/MEDQ  D:  06/14/2011  T:  06/15/2011  Job:  [id number]

## 2011-06-15 NOTE — Progress Notes (Signed)
Left femoral arterial line removed, catheter intact.  Pressure held for 10 minutes.  No sign of bleeding/oozing.  Pressure dressing applied (4x4's and medipore tape).

## 2011-06-15 NOTE — Progress Notes (Signed)
1 Day Post-Op Procedure(s) (LRB): CORONARY ARTERY BYPASS GRAFTING (CABG) (N/A) Subjective: Some nausea   Objective: Vital signs in last 24 hours: Temp:  [96.6 F (35.9 C)-99.1 F (37.3 C)] 98.6 F (37 C) (01/11 0715) Pulse Rate:  [88-105] 99  (01/11 0715) Cardiac Rhythm:  [-] Sinus tachycardia (01/11 0300) Resp:  [10-23] 16  (01/11 0715) BP: (84-109)/(42-56) 105/56 mmHg (01/11 0700) SpO2:  [90 %-100 %] 96 % (01/11 0715) Arterial Line BP: (82-137)/(49-77) 137/60 mmHg (01/11 0530) FiO2 (%):  [39.9 %-50.7 %] 39.9 % (01/10 1845) Weight:  [104.1 kg (229 lb 8 oz)] 104.1 kg (229 lb 8 oz) (01/11 0609)  Hemodynamic parameters for last 24 hours: PAP: (14-30)/(2-15) 21/9 mmHg CO:  [3.8 L/min-5.1 L/min] 5.1 L/min CI:  [1.9 L/min/m2-2.7 L/min/m2] 2.7 L/min/m2  Intake/Output from previous day: 01/10 0701 - 01/11 0700 In: 6888.3 [P.O.:210; I.V.:4674.3; Blood:544; NG/GT:30; IV Piggyback:1430] Out: 4900 [Urine:2780; Emesis/NG output:150; Blood:1500; Chest Tube:470] Intake/Output this shift:    General appearance: alert and cooperative Neurologic: intact Heart: regular rate and rhythm, S1, S2 normal, no murmur, click, rub or gallop Lungs: clear to auscultation bilaterally Extremities: edema moderate Wound: dressing dry  Lab Results:  Basename 06/15/11 0340 06/14/11 1845  WBC 13.9* 14.0*  HGB 9.5* 9.5*  HCT 29.3* 28.9*  PLT 169 156   BMET:  Basename 06/15/11 0340 06/14/11 1845 06/14/11 1844 06/14/11 0411  NA 133* -- 135 --  K 4.3 -- 4.6 --  CL 104 -- 105 --  CO2 20 -- -- 25  GLUCOSE 106* -- 95 --  BUN 13 -- 13 --  CREATININE 0.89 0.97 -- --  CALCIUM 8.0* -- -- 9.0    PT/INR:  Basename 06/14/11 1347  LABPROT 22.3*  INR 1.92*   ABG    Component Value Date/Time   PHART 7.376 06/14/2011 2114   HCO3 21.9 06/14/2011 2114   TCO2 23 06/14/2011 2114   ACIDBASEDEF 3.0* 06/14/2011 2114   O2SAT 96.0 06/14/2011 2114   CBG (last 3)   Basename 06/15/11 0733 06/15/11 0522 06/15/11  0317  GLUCAP 145* 92 98    CXR:  Right base atelectasis  ECG:  Sinus, lateral T wave abnormality  Assessment/Plan: S/P Procedure(s) (LRB): CORONARY ARTERY BYPASS GRAFTING (CABG) (N/A) Mobilize d/c tubes/lines See progression orders Wean neo off, then start diuresis Recent choedocholithiasis with cholangitis treated with ERCP and sphincterotomy, cholelithiasis without cholecystitis.  Follow  LOS: 14 days    BARTLE,BRYAN K 06/15/2011

## 2011-06-16 ENCOUNTER — Inpatient Hospital Stay (HOSPITAL_COMMUNITY): Payer: Medicare Other

## 2011-06-16 LAB — CBC
MCH: 30.4 pg (ref 26.0–34.0)
MCHC: 32.4 g/dL (ref 30.0–36.0)
Platelets: 217 10*3/uL (ref 150–400)
RBC: 3.55 MIL/uL — ABNORMAL LOW (ref 3.87–5.11)

## 2011-06-16 LAB — BASIC METABOLIC PANEL
Calcium: 8.6 mg/dL (ref 8.4–10.5)
GFR calc Af Amer: 77 mL/min — ABNORMAL LOW (ref >90)
GFR calc non Af Amer: 66 mL/min — ABNORMAL LOW (ref >90)
Glucose, Bld: 91 mg/dL (ref 70–99)
Potassium: 4.8 mEq/L (ref 3.5–5.1)
Sodium: 129 mEq/L — ABNORMAL LOW (ref 135–145)

## 2011-06-16 LAB — GLUCOSE, CAPILLARY
Glucose-Capillary: 105 mg/dL — ABNORMAL HIGH (ref 70–99)
Glucose-Capillary: 139 mg/dL — ABNORMAL HIGH (ref 70–99)
Glucose-Capillary: 180 mg/dL — ABNORMAL HIGH (ref 70–99)

## 2011-06-16 MED ORDER — FUROSEMIDE 10 MG/ML IJ SOLN
20.0000 mg | INTRAMUSCULAR | Status: DC
  Filled 2011-06-16: qty 2

## 2011-06-16 MED ORDER — SODIUM CHLORIDE 0.9 % IJ SOLN
10.0000 mL | INTRAMUSCULAR | Status: DC | PRN
  Administered 2011-06-18: 10 mL via INTRAVENOUS

## 2011-06-16 MED ORDER — ENSURE CLINICAL ST REVIGOR PO LIQD
237.0000 mL | Freq: Three times a day (TID) | ORAL | Status: DC
  Administered 2011-06-17 (×2): 237 mL via ORAL
  Administered 2011-06-18: 13:00:00 via ORAL
  Administered 2011-06-18 (×2): 237 mL via ORAL
  Administered 2011-06-19 (×3): via ORAL
  Administered 2011-06-20 – 2011-06-25 (×11): 237 mL via ORAL
  Filled 2011-06-16: qty 237

## 2011-06-16 MED ORDER — INSULIN ASPART 100 UNIT/ML ~~LOC~~ SOLN
0.0000 [IU] | SUBCUTANEOUS | Status: DC
  Administered 2011-06-17 (×2): 4 [IU] via SUBCUTANEOUS
  Administered 2011-06-17: 8 [IU] via SUBCUTANEOUS
  Administered 2011-06-18: 4 [IU] via SUBCUTANEOUS
  Administered 2011-06-18: 2 [IU] via SUBCUTANEOUS
  Administered 2011-06-18: 4 [IU] via SUBCUTANEOUS
  Administered 2011-06-18: 3 [IU] via SUBCUTANEOUS
  Administered 2011-06-19: 2 [IU] via SUBCUTANEOUS
  Administered 2011-06-19: 4 [IU] via SUBCUTANEOUS

## 2011-06-16 MED ORDER — FUROSEMIDE 10 MG/ML IJ SOLN
20.0000 mg | Freq: Two times a day (BID) | INTRAMUSCULAR | Status: DC
  Administered 2011-06-16 – 2011-06-19 (×7): 20 mg via INTRAVENOUS
  Filled 2011-06-16 (×8): qty 2

## 2011-06-16 MED ORDER — INSULIN ASPART 100 UNIT/ML ~~LOC~~ SOLN: 0.0000 [IU] | SUBCUTANEOUS | Status: AC

## 2011-06-16 MED ORDER — SODIUM CHLORIDE 0.9 % IJ SOLN
10.0000 mL | Freq: Two times a day (BID) | INTRAMUSCULAR | Status: DC
  Administered 2011-06-16 – 2011-06-19 (×6): 10 mL via INTRAVENOUS

## 2011-06-16 NOTE — Progress Notes (Signed)
2 Days Post-Op Procedure(s) (LRB): CORONARY ARTERY BYPASS GRAFTING (CABG) (N/A)                      [address]            [place] [address]          [phone]      Subjective: Very weak walked 139ft       maintaining NSR  Objective: Vital signs in last 24 hours: Temp:  [97.8 F (36.6 C)-98.5 F (36.9 C)] 97.8 F (36.6 C) (01/12 2000) Pulse Rate:  [92-103] 94  (01/12 2100) Cardiac Rhythm:  [-] Normal sinus rhythm (01/12 2000) Resp:  [13-23] 18  (01/12 2100) BP: (83-134)/(52-68) 123/54 mmHg (01/12 2100) SpO2:  [93 %-99 %] 95 % (01/12 2100) Weight:  [222 lb 0.1 oz (100.7 kg)] 222 lb 0.1 oz (100.7 kg) (01/12 0600)  Hemodynamic parameters for last 24 hours:    Intake/Output from previous day: 01/11 0701 - 01/12 0700 In: 1470.5 [P.O.:180; I.V.:534.5; IV Piggyback:756] Out: 1135 [Urine:1085; Chest Tube:50] Intake/Output this shift: Total I/O In: 260 [P.O.:240; I.V.:20] Out: 40 [Urine:40]    Lab Results:  Basename 06/16/11 0640 06/15/11 1724  WBC 15.3* 16.3*  HGB 10.8* 10.6*  HCT 33.3* 32.4*  PLT 217 146*   BMET:  Basename 06/16/11 0640 06/15/11 1724  NA 129* 128*  K 4.8 4.3  CL 98 98  CO2 23 22  GLUCOSE 91 194*  BUN 11 12  CREATININE 0.87 0.83  CALCIUM 8.6 8.4    PT/INR:  Basename 06/14/11 1347  LABPROT 22.3*  INR 1.92*   ABG    Component Value Date/Time   PHART 7.376 06/14/2011 2114   HCO3 21.9 06/14/2011 2114   TCO2 23 06/14/2011 2114   ACIDBASEDEF 3.0* 06/14/2011 2114   O2SAT 96.0 06/14/2011 2114   CBG (last 3)   Basename 06/16/11 2001 06/16/11 1630 06/16/11 1204  GLUCAP 180* 165* 139*    Assessment/Plan: S/P Procedure(s) (LRB): CORONARY ARTERY BYPASS GRAFTING (CABG) (N/A) Diuresis PT consult   LOS: 15 days    VAN TRIGT III,PETER 06/16/2011

## 2011-06-17 ENCOUNTER — Inpatient Hospital Stay (HOSPITAL_COMMUNITY): Payer: Medicare Other

## 2011-06-17 LAB — BASIC METABOLIC PANEL
BUN: 11 mg/dL (ref 6–23)
CO2: 24 mEq/L (ref 19–32)
Calcium: 8.4 mg/dL (ref 8.4–10.5)
Chloride: 97 mEq/L (ref 96–112)
Creatinine, Ser: 0.89 mg/dL (ref 0.50–1.10)
GFR calc Af Amer: 75 mL/min — ABNORMAL LOW (ref >90)
GFR calc non Af Amer: 65 mL/min — ABNORMAL LOW (ref >90)
Glucose, Bld: 84 mg/dL (ref 70–99)
Potassium: 3.7 mEq/L (ref 3.5–5.1)
Sodium: 131 mEq/L — ABNORMAL LOW (ref 135–145)

## 2011-06-17 LAB — CBC
HCT: 31.6 % — ABNORMAL LOW (ref 36.0–46.0)
Hemoglobin: 10.7 g/dL — ABNORMAL LOW (ref 12.0–15.0)
MCH: 30.8 pg (ref 26.0–34.0)
MCHC: 33.9 g/dL (ref 30.0–36.0)
MCV: 91.1 fL (ref 78.0–100.0)
Platelets: ADEQUATE 10*3/uL (ref 150–400)
RBC: 3.47 MIL/uL — ABNORMAL LOW (ref 3.87–5.11)
RDW: 14.3 % (ref 11.5–15.5)
WBC: 13.1 10*3/uL — ABNORMAL HIGH (ref 4.0–10.5)

## 2011-06-17 LAB — GLUCOSE, CAPILLARY
Glucose-Capillary: 161 mg/dL — ABNORMAL HIGH (ref 70–99)
Glucose-Capillary: 223 mg/dL — ABNORMAL HIGH (ref 70–99)
Glucose-Capillary: 63 mg/dL — ABNORMAL LOW (ref 70–99)

## 2011-06-17 LAB — AMYLASE: Amylase: 43 U/L (ref 0–105)

## 2011-06-17 MED ORDER — PANTOPRAZOLE SODIUM 40 MG PO PACK
40.0000 mg | PACK | Freq: Every day | ORAL | Status: DC
  Administered 2011-06-17 – 2011-06-25 (×8): 40 mg via ORAL
  Filled 2011-06-17 (×10): qty 20

## 2011-06-17 MED ORDER — TRAMADOL HCL 50 MG PO TABS
50.0000 mg | ORAL_TABLET | Freq: Four times a day (QID) | ORAL | Status: DC | PRN
  Administered 2011-06-19: 50 mg via ORAL
  Filled 2011-06-17: qty 1

## 2011-06-17 MED ORDER — SODIUM CHLORIDE 0.9 % IV SOLN
INTRAVENOUS | Status: DC
  Administered 2011-06-17: 20 mL/h via INTRAVENOUS
  Administered 2011-06-19: 19:00:00 via INTRAVENOUS

## 2011-06-17 NOTE — Progress Notes (Signed)
3 Days Post-Op Procedure(s) (LRB): CORONARY ARTERY BYPASS GRAFTING (CABG) (N/A)                                                      [address]            [place] [address]          [phone]      SubjectiveNusea,weak not walking  Objective: Vital signs in last 24 hours: Temp:  [97.8 F (36.6 C)-98.5 F (36.9 C)] 97.9 F (36.6 C) (01/13 0812) Pulse Rate:  [92-102] 96  (01/13 0800) Cardiac Rhythm:  [-] Normal sinus rhythm (01/13 0800) Resp:  [12-25] 23  (01/13 0800) BP: (85-131)/(50-74) 101/52 mmHg (01/13 0800) SpO2:  [90 %-99 %] 94 % (01/13 0800) Weight:  [219 lb 9.3 oz (99.6 kg)] 219 lb 9.3 oz (99.6 kg) (01/13 0700)  Hemodynamic parameters for last 24 hours:    Intake/Output from previous day: 01/12 0701 - 01/13 0700 In: 710 [P.O.:240; I.V.:470] Out: 2890 [Urine:2890] Intake/Output this shift: Total I/O In: 20 [I.V.:20] Out: 75 [Urine:75]  ABD non tender,lungs clear  Lab Results:  Basename 06/17/11 0555 06/16/11 0640  WBC 13.1* 15.3*  HGB 10.7* 10.8*  HCT 31.6* 33.3*  PLT PLATELET CLUMPS NOTED ON SMEAR, COUNT APPEARS ADEQUATE 217   BMET:  Basename 06/17/11 0555 06/16/11 0640  NA 131* 129*  K 3.7 4.8  CL 97 98  CO2 24 23  GLUCOSE 84 91  BUN 11 11  CREATININE 0.89 0.87  CALCIUM 8.4 8.6    PT/INR:  Basename 06/14/11 1347  LABPROT 22.3*  INR 1.92*   ABG    Component Value Date/Time   PHART 7.376 06/14/2011 2114   HCO3 21.9 06/14/2011 2114   TCO2 23 06/14/2011 2114   ACIDBASEDEF 3.0* 06/14/2011 2114   O2SAT 96.0 06/14/2011 2114   CBG (last 3)   Basename 06/17/11 0802 06/17/11 0332 06/16/11 2351  GLUCAP 171* 102* 84    Assessment/Plan: S/P Procedure(s) (LRB): CORONARY ARTERY BYPASS GRAFTING (CABG) (N/A)     PLAN check amylase minimize narcotics   LOS: 16 days    VAN TRIGT III,PETER 06/17/2011

## 2011-06-17 NOTE — Progress Notes (Signed)
Patient examined and record reviewed.Hemodynamics stable,labs satisfactory.Patient had stable day.Continue current care.   The patient continues with nausea but was able to eat some meals today. The patient walk 200 feet. Abdominal exam remains benign. Serum amylase today was normal. The patient is maintaining sinus rhythm.   VAN TRIGT III,Alastor Kneale 06/17/2011

## 2011-06-17 NOTE — Progress Notes (Signed)
CBG: 62  Treatment: 15 GM carbohydrate snack  Symptoms: Sweaty and Shaky  Follow-up CBG: Time: 0010  CBG Result:117  Possible Reasons for Event: Vomiting and Inadequate meal intake  Comments/MD notified: Dr Laneta Simmers to be notified in A.M.   Jeri Modena

## 2011-06-18 ENCOUNTER — Inpatient Hospital Stay (HOSPITAL_COMMUNITY): Payer: Medicare Other

## 2011-06-18 LAB — BASIC METABOLIC PANEL
BUN: 12 mg/dL (ref 6–23)
CO2: 28 mEq/L (ref 19–32)
Calcium: 8.1 mg/dL — ABNORMAL LOW (ref 8.4–10.5)
Chloride: 96 mEq/L (ref 96–112)
Creatinine, Ser: 0.97 mg/dL (ref 0.50–1.10)
GFR calc Af Amer: 68 mL/min — ABNORMAL LOW (ref >90)
GFR calc non Af Amer: 58 mL/min — ABNORMAL LOW (ref >90)
Glucose, Bld: 82 mg/dL (ref 70–99)
Potassium: 3.4 mEq/L — ABNORMAL LOW (ref 3.5–5.1)
Sodium: 133 mEq/L — ABNORMAL LOW (ref 135–145)

## 2011-06-18 LAB — GLUCOSE, CAPILLARY
Glucose-Capillary: 92 mg/dL (ref 70–99)
Glucose-Capillary: 195 mg/dL — ABNORMAL HIGH (ref 70–99)
Glucose-Capillary: 147 mg/dL — ABNORMAL HIGH (ref 70–99)
Glucose-Capillary: 175 mg/dL — ABNORMAL HIGH (ref 70–99)

## 2011-06-18 LAB — CBC
HCT: 30.8 % — ABNORMAL LOW (ref 36.0–46.0)
Hemoglobin: 10.2 g/dL — ABNORMAL LOW (ref 12.0–15.0)
MCH: 29.9 pg (ref 26.0–34.0)
MCHC: 33.1 g/dL (ref 30.0–36.0)
MCV: 90.3 fL (ref 78.0–100.0)
Platelets: 270 10*3/uL (ref 150–400)
RBC: 3.41 MIL/uL — ABNORMAL LOW (ref 3.87–5.11)
RDW: 14.4 % (ref 11.5–15.5)
WBC: 11.4 10*3/uL — ABNORMAL HIGH (ref 4.0–10.5)

## 2011-06-18 MED ORDER — POTASSIUM CHLORIDE 10 MEQ/50ML IV SOLN
10.0000 meq | INTRAVENOUS | Status: AC
  Administered 2011-06-18 (×3): 10 meq via INTRAVENOUS
  Filled 2011-06-18 (×4): qty 50

## 2011-06-18 MED ORDER — POTASSIUM CHLORIDE 10 MEQ/50ML IV SOLN
10.0000 meq | INTRAVENOUS | Status: AC
  Administered 2011-06-18 (×2): 10 meq via INTRAVENOUS
  Filled 2011-06-18: qty 50

## 2011-06-18 MED ORDER — METOCLOPRAMIDE HCL 10 MG PO TABS
10.0000 mg | ORAL_TABLET | Freq: Three times a day (TID) | ORAL | Status: DC
  Administered 2011-06-18 – 2011-06-26 (×31): 10 mg via ORAL
  Filled 2011-06-18 (×39): qty 1

## 2011-06-18 MED FILL — Electrolyte-R (PH 7.4) Solution: INTRAVENOUS | Qty: 5000 | Status: AC

## 2011-06-18 MED FILL — Sodium Chloride IV Soln 0.9%: INTRAVENOUS | Qty: 1000 | Status: AC

## 2011-06-18 MED FILL — Sodium Chloride Irrigation Soln 0.9%: Qty: 3000 | Status: AC

## 2011-06-18 MED FILL — Heparin Sodium (Porcine) Inj 1000 Unit/ML: INTRAMUSCULAR | Qty: 40 | Status: AC

## 2011-06-18 NOTE — Progress Notes (Signed)
Most CBG's within target range, but had one mild hypoglycemia episode and a couple CBG's above target.  On home dose of Prednisone as of 06/13/11.   Results for [name], [name] (MRN [id number]) as of 06/18/2011 16:10  Ref. Range 06/17/2011 15:52 06/17/2011 19:55 06/17/2011 23:39 06/18/2011 00:12 06/18/2011 04:07 06/18/2011 07:55 06/18/2011 12:25 06/18/2011 16:03  Glucose-Capillary Latest Range: 70-99 mg/dL 213 (H) 086 (H) 63 (L) 117 (H) 92 167 (H) 195 (H) 147 (H)    May benefit from changing CVTS correction scale from every 4 hours to tid with meals.  Could also consider adding HS scale starting at 201: 201 - 250 = 2 units; 251 - 300 = 3 units; 301 - 350 = 4 units, etc.  Thank you.

## 2011-06-18 NOTE — Progress Notes (Signed)
UR Completed.  Jazmynn Pho Jane [phone] 06/18/2011  

## 2011-06-18 NOTE — Progress Notes (Signed)
Patient ID: [name], female   DOB: [birth year], 71 y.o.   MRN: [id number] Nausea better today BP 95/62  Pulse 101  Temp(Src) 97.8 F (36.6 C) (Oral)  Resp 20  Ht 5\' 6"  (1.676 m)  Wt 216 lb 14.9 oz (98.4 kg)  BMI 35.01 kg/m2  SpO2 95% Comfortable, no distress Continue current care

## 2011-06-18 NOTE — Plan of Care (Signed)
Problem: Phase II Progression Outcomes Goal: Dangles within 2 hrs after extubation Outcome: Not Applicable Date Met:  06/18/11 Pt with femoral line in place, unable to dangle until next A.M. After order received to D/c fem. aline

## 2011-06-18 NOTE — Progress Notes (Signed)
SUBJECTIVE: c/o weakness but no SOB or chest pain.   BP 92/58  Pulse 101  Temp(Src) 97.3 F (36.3 C) (Oral)  Resp 21  Ht 5\' 6"  (1.676 m)  Wt 216 lb 14.9 oz (98.4 kg)  BMI 35.01 kg/m2  SpO2 93%  Intake/Output Summary (Last 24 hours) at 06/18/11 0802 Last data filed at 06/18/11 0500  Gross per 24 hour  Intake    796 ml  Output   2780 ml  Net  -1984 ml    PHYSICAL EXAM General: Well developed, well nourished, in no acute distress. Alert and oriented x 3.  Psych:  Good affect, responds appropriately Neck: No JVD. No masses noted.  Lungs: Clear bilaterally with no wheezes or rhonci noted.  Heart: RRR with no murmurs noted. Abdomen: Bowel sounds are present. Soft, non-tender.  Extremities: No lower extremity edema.   LABS: Basic Metabolic Panel:  Basename 06/18/11 0430 06/17/11 0555 06/15/11 1724  NA 133* 131* --  K 3.4* 3.7 --  CL 96 97 --  CO2 28 24 --  GLUCOSE 82 84 --  BUN 12 11 --  CREATININE 0.97 0.89 --  CALCIUM 8.1* 8.4 --  MG -- -- 1.9  PHOS -- -- --   CBC:  Basename 06/18/11 0430 06/17/11 0555  WBC 11.4* 13.1*  NEUTROABS -- --  HGB 10.2* 10.7*  HCT 30.8* 31.6*  MCV 90.3 91.1  PLT 270 PLATELET CLUMPS NOTED ON SMEAR, COUNT APPEARS ADEQUATE    Current Meds:    . acetaminophen  1,000 mg Oral Q6H   Or  . acetaminophen (TYLENOL) oral liquid 160 mg/5 mL  975 mg Per Tube Q6H  . aspirin EC  325 mg Oral Daily   Or  . aspirin  324 mg Per Tube Daily  . bisacodyl  10 mg Oral Daily   Or  . bisacodyl  10 mg Rectal Daily  . ciprofloxacin  400 mg Intravenous Q12H  . docusate sodium  200 mg Oral Daily  . enoxaparin  40 mg Subcutaneous QHS  . feeding supplement  237 mL Oral TID WC  . furosemide  20 mg Intravenous BID  . insulin aspart  0-24 Units Subcutaneous Q4H  . insulin glargine  20 Units Subcutaneous QHS  . metronidazole  500 mg Intravenous Q6H  . pantoprazole sodium  40 mg Oral Q1200  . potassium chloride  10 mEq Intravenous Q1 Hr x 3  . predniSONE   5 mg Oral Q breakfast  . sodium chloride  10 mL Intravenous Q12H  . sodium chloride  3 mL Intravenous Q12H  . DISCONTD: pantoprazole  40 mg Oral Q1200     ASSESSMENT AND PLAN:  1. 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 Laneta Simmers. Doing well. Weak and working with PT. Statin if ok from surgical standpoint. Low dose beta blocker when BP tolerates.      Aysel Gilchrest  1/14/20138:02 AM

## 2011-06-18 NOTE — Progress Notes (Signed)
Physical Therapy Treatment Patient Details Name: SHAWNIECE OYOLA MRN: [id number] DOB: [birth year] Today's Date: 06/18/2011  PT Assessment/Plan  PT - Assessment/Plan Comments on Treatment Session: Did better today when she has the w/c in front of her, but still limited by increased HR and fatigue with mobility. Her heart rate got over 130s and she became hot so we sat her in the w/c and wheeled her back to the room.  PT Plan:  (still not sure if she will have cholesystectomy while here) PT Goals  Acute Rehab PT Goals PT Goal: Supine/Side to Sit - Progress: Not met PT Goal: Sit to Supine/Side - Progress: Progressing toward goal PT Goal: Sit to Stand - Progress: Progressing toward goal PT Goal: Stand to Sit - Progress: Progressing toward goal PT Transfer Goal: Bed to Chair/Chair to Bed - Progress: Progressing toward goal PT Goal: Ambulate - Progress: Progressing toward goal PT Goal: Up/Down Stairs - Progress: Not met PT Goal: Perform Home Exercise Program - Progress: Progressing toward goal  PT Treatment Precautions/Restrictions  Precautions Precautions: Sternal Restrictions Weight Bearing Restrictions: No RUE Weight Bearing: Non weight bearing Other Position/Activity Restrictions: ROM out of splint per orders Mobility (including Balance) Bed Mobility Sit to Sidelying Left: 1: +2 Total assist Sit to Sidelying Left Details (indicate cue type and reason): assist for sequencing especially to elevate legs to bed; more assist to reposition pt in bed; she couldn't figure out how to scoot her hips to the left with verbal cueing so we had to use the pad to reposition Scooting to HOB: 1: +2 Total assist Scooting to [facility] Details (indicate cue type and reason): +2totalpt10% Transfers Sit to Stand: 1: +2 Total assist;From chair/3-in-1 Sit to Stand Details (indicate cue type and reason): +2totalpt65%; sequencing cues for anterior translation and for follow through to stand Stand to Sit: 3: Mod  assist;To chair/3-in-1;To bed Stand to Sit Details: mod facilitation at hips to guide hips to chair and then to the bed Stand Pivot Transfers: 1: +2 Total assist (70%) Ambulation/Gait Ambulation/Gait Assistance: 4: Min assist;3: Mod assist Ambulation/Gait Assistance Details (indicate cue type and reason): pt amb the first 8 ft with unilateral hand held assist on the left but pt with a lot of anxiety, HR kept increasing to upper 120s; pt reported to me that she had pushed the w/c with nursing over the weekend and she felt more safe doimg that (even with her RUE splinted), once she had the w/c in front of her she then was able to ambulate another 20 ft wtih minA; (RUE resting on the handles); still with slower speed but much faster than when only with unilateral hand held assist Ambulation Distance (Feet): 28 Feet Assistive device:  (pushing w/c) Gait Pattern: Shuffle;Trunk flexed  Balance Balance Assessed: No Exercise  Total Joint Exercises Ankle Circles/Pumps: AROM;Both;10 reps;Seated End of Session PT - End of Session Equipment Utilized During Treatment: Gait belt Activity Tolerance: Patient limited by fatigue;Treatment limited secondary to medical complications (Comment) (HR remained upper 120s during gait) Patient left: in bed;with call bell in reach;with family/visitor present Nurse Communication: Mobility status for transfers;Mobility status for ambulation General Behavior During Session:  (anxious) Cognition: WFL for tasks performed  [facility] HELEN 06/18/2011, 12:01 PM

## 2011-06-18 NOTE — Progress Notes (Signed)
Nutrition Follow-up  S/p cardiac catheterization 1/7, median sternotomy, extracorporeal circulation, CABG 1/10. Pt states her appetite is poor; complaining of nausea.  Diet Order:  Heart Healthy. PO intake < 25%. Ensure Clinical Strength PO TID ordered 1/13 -- pt drinking.  Meds: Scheduled Meds:   . acetaminophen  1,000 mg Oral Q6H   Or  . acetaminophen (TYLENOL) oral liquid 160 mg/5 mL  975 mg Per Tube Q6H  . aspirin EC  325 mg Oral Daily   Or  . aspirin  324 mg Per Tube Daily  . bisacodyl  10 mg Oral Daily   Or  . bisacodyl  10 mg Rectal Daily  . ciprofloxacin  400 mg Intravenous Q12H  . docusate sodium  200 mg Oral Daily  . enoxaparin  40 mg Subcutaneous QHS  . feeding supplement  237 mL Oral TID WC  . furosemide  20 mg Intravenous BID  . insulin aspart  0-24 Units Subcutaneous Q4H  . insulin glargine  20 Units Subcutaneous QHS  . metronidazole  500 mg Intravenous Q6H  . pantoprazole sodium  40 mg Oral Q1200  . potassium chloride  10 mEq Intravenous Q1 Hr x 3  . potassium chloride  10 mEq Intravenous Q1 Hr x 2  . predniSONE  5 mg Oral Q breakfast  . sodium chloride  10 mL Intravenous Q12H  . sodium chloride  3 mL Intravenous Q12H  . DISCONTD: pantoprazole  40 mg Oral Q1200   Continuous Infusions:   . sodium chloride 20 mL/hr at 06/18/11 0400  . DISCONTD: sodium chloride 20 mL (06/14/11 1400)  . DISCONTD: lactated ringers 20 mL (06/14/11 2329)   PRN Meds:.ondansetron (ZOFRAN) IV, sodium chloride, sodium chloride, traMADol, DISCONTD: dextrose, DISCONTD: morphine, DISCONTD: oxyCODONE  Labs:  CMP     Component Value Date/Time   NA 133* 06/18/2011 0430   K 3.4* 06/18/2011 0430   CL 96 06/18/2011 0430   CO2 28 06/18/2011 0430   GLUCOSE 82 06/18/2011 0430   BUN 12 06/18/2011 0430   CREATININE 0.97 06/18/2011 0430   CALCIUM 8.1* 06/18/2011 0430   PROT 6.0 06/14/2011 0411   ALBUMIN 2.5* 06/14/2011 0411   AST 13 06/14/2011 0411   ALT 13 06/14/2011 0411   ALKPHOS 104 06/14/2011  0411   BILITOT 0.7 06/14/2011 0411   GFRNONAA 58* 06/18/2011 0430   GFRAA 68* 06/18/2011 0430     Intake/Output Summary (Last 24 hours) at 06/18/11 0914 Last data filed at 06/18/11 0500  Gross per 24 hour  Intake    776 ml  Output   2780 ml  Net  -2004 ml    Weight Status:  98.4 kg (1/14) -- down some likely with diuresis  Nutrition Dx:  Inadequate Oral Intake now r/t poor appetite & nausea, ongoing  Goal:  Meet 90-100% of estimated nutrition needs to promote post-op healing, unmet Monitor: PO intake, weight, labs, I/O's  Intervention:    Continue Ensure Clinical Strength PO TID  RD to follow for nutrition care plan  Alger Memos Pager #:  [phone]

## 2011-06-18 NOTE — Progress Notes (Signed)
Occupational Therapy Evaluation Patient Details Name: [name] MRN: [id number] DOB: [birth year] Today's Date: 06/18/2011  Problem List:  Patient Active Problem List  Diagnoses  . Troponin level elevated  . Acute myocardial infarction, subendocardial infarction, subsequent episode of care  . Acute systolic congestive heart failure  . Acute renal failure  . Hypokalemia  . Hypomagnesemia  . Dextrocardia    Past Medical History:  Past Medical History  Diagnosis Date  . Arthritis   . Neuropathy   . Reflux   . Hypertension   . Elevated cholesterol   . Situs inversus   . Dextrocardia   . Fracture 05/24/2011    right; "did not have surgery"  . DVT of leg (deep venous thrombosis) ~2006    left  . Pneumonia 06/13/11    "couple times; long time ago"  . Myocardial infarction 06/01/11  . Chest pain   . Chronic bronchitis   . Blood transfusion   . Anemia   . H/O hiatal hernia    Past Surgical History:  Past Surgical History  Procedure Date  . Ercp 06/03/2011    Procedure: ENDOSCOPIC RETROGRADE CHOLANGIOPANCREATOGRAPHY (ERCP);  Surgeon: Petra Kuba, MD;  Location: [facility] OR;  Service: Endoscopy;  Laterality: N/A;  . Vaginal hysterectomy 1970's  . Cardiac catheterization 06/11/11  . Coronary artery bypass graft 06/14/2011    Procedure: CORONARY ARTERY BYPASS GRAFTING (CABG);  Surgeon: Alleen Borne, MD;  Location: [facility] OR;  Service: Open Heart Surgery;  Laterality: N/A;    OT Assessment/Plan/Recommendation OT Assessment Clinical Impression Statement: 71 y/o female with complicated admission to [facility]. Originally presented s/p fall and elbow fracture with plans for cholesystectomy. Since admission she developed cardiac complications and is now s/p CABG POD #1. OT was seeing pt prior to her surgery and have now been reconsulted. Will benefit from skilled OT in the acute setting for the below listed problems so as to maximize pt's I with ADL and ADL mobility prior to d/c to next venue of  care. OT Recommendation/Assessment: Patient will need skilled OT in the acute care venue OT Problem List: Decreased strength;Decreased activity tolerance;Impaired balance (sitting and/or standing);Decreased safety awareness;Decreased knowledge of use of DME or AE;Cardiopulmonary status limiting activity OT Therapy Diagnosis : Generalized weakness;Acute pain OT Plan OT Frequency: Min 2X/week OT Treatment/Interventions: Self-care/ADL training;Energy conservation;DME and/or AE instruction;Therapeutic activities;Patient/family education;Balance training OT Recommendation Recommendations for Other Services: Rehab consult Follow Up Recommendations: Inpatient Rehab Equipment Recommended: Defer to next venue OT Goals ADL Goals ADL Goal: Toilet Transfer - Progress: Progressing toward goals Arm Goals Additional Arm Goal #1: Pt. I'ly instruct caregiver on RUE AA/ROM to shoulder and distally. Arm Goal: Additional Goal #1 - Progress: Not met  OT Evaluation Precautions/Restrictions  Precautions Precautions: Sternal Restrictions Weight Bearing Restrictions: No RUE Weight Bearing: Non weight bearing Other Position/Activity Restrictions: ROM out of splint per orders Prior Functioning Home Living Lives With: Spouse Receives Help From: Family Type of Home: Apartment Home Layout: One level Home Access: Stairs to enter Entrance Stairs-Rails: Can reach both Entrance Stairs-Number of Steps: 4 Bathroom Shower/Tub: Associate Professor: Yes How Accessible: Accessible via walker Home Adaptive Equipment: Shower chair with back;Straight cane Prior Function Level of Independence: Independent with basic ADLs Able to Take Stairs?: Yes Driving: Yes Vocation: Retired ADL ADL Eating/Feeding: Performed;Set up Where Assessed - Eating/Feeding: Chair Grooming: Performed;Wash/dry face;Set up Where Assessed - Grooming: Sitting, chair;Supported Upper Body  Bathing: Simulated;Minimal assistance Where Assessed - Upper Body Bathing: Sitting, bed  Lower Body Bathing: Maximal assistance;Simulated Where Assessed - Lower Body Bathing: Sit to stand from bed Upper Body Dressing: Simulated;Moderate assistance Where Assessed - Upper Body Dressing: Sitting, bed Lower Body Dressing: Simulated;Maximal assistance Where Assessed - Lower Body Dressing: Sit to stand from bed Toilet Transfer: Moderate assistance;Simulated Toilet Transfer Details (indicate cue type and reason): simulated from EOB to chair  Toilet Transfer Method: Ambulating Tub/Shower Transfer: Not assessed Ambulation Related to ADLs: Mod hand-held assist Cognition Cognition Overall Cognitive Status: Appears within functional limits for tasks assessed Orientation Level: Oriented X4 Sensation/Coordination Sensation Light Touch: Appears Intact Extremity Assessment RUE Assessment RUE Assessment:  (Pt with elbow fx and in cast- removed for AA/ROM) LUE Assessment LUE Assessment: Within Functional Limits Mobility  Bed Mobility Rolling Left: 1: +2 Total assist Rolling Left Details (indicate cue type and reason): 2totalpt40%; cues to bend RLE and push off to initiate rolling; pt holding cardiac pillow through roll; use of pad for follow through onto her left side Left Sidelying to Sit: 1: +1 Total assist Transfers Sit to Stand: 1: +2 Total assist Sit to Stand Details (indicate cue type and reason): +2totalpt65%; sequencing cues for anterior translation and for follow through to stand Stand to Sit: 4: Min assist;To chair/3-in-1 Exercises General Exercises - Upper Extremity Shoulder Flexion: AAROM;10 reps;Seated Shoulder Horizontal ABduction: AAROM;Right;10 reps Shoulder Horizontal ADduction: AAROM;Right;10 reps Elbow Flexion: AAROM;Right;10 reps;Seated Elbow Extension: AAROM;Right;10 reps;Seated Wrist Flexion: AROM;Right;10 reps;Seated Wrist Extension: AROM;Right;10 reps;Seated End of  Session OT - End of Session Equipment Utilized During Treatment: Gait belt Activity Tolerance: Patient limited by fatigue;Patient limited by pain Patient left: in chair;with call bell in reach;with family/visitor present Nurse Communication: Mobility status for transfers;Mobility status for ambulation General Behavior During Session: [facility] for tasks performed Cognition: [facility] for tasks performed   [name] 06/18/2011, 4:46 PM

## 2011-06-18 NOTE — Progress Notes (Signed)
4 Days Post-Op Procedure(s) (LRB): CORONARY ARTERY BYPASS GRAFTING (CABG) (N/A) Subjective: Weak, some nausea but taking Ensure. Had BM this am.  Objective: Vital signs in last 24 hours: Temp:  [97.3 F (36.3 C)-98.1 F (36.7 C)] 97.4 F (36.3 C) (01/14 1227) Pulse Rate:  [97-130] 103  (01/14 1200) Cardiac Rhythm:  [-] Normal sinus rhythm (01/14 0800) Resp:  [14-39] 20  (01/14 1200) BP: (82-104)/(41-76) 95/76 mmHg (01/14 1200) SpO2:  [90 %-97 %] 92 % (01/14 1200) Weight:  [98.4 kg (216 lb 14.9 oz)] 98.4 kg (216 lb 14.9 oz) (01/14 0600)  Hemodynamic parameters for last 24 hours:    Intake/Output from previous day: 01/13 0701 - 01/14 0700 In: 1118 [P.O.:720; I.V.:96; IV Piggyback:302] Out: 2855 [Urine:2855] Intake/Output this shift: Total I/O In: 152 [IV Piggyback:152] Out: 500 [Urine:500]  General appearance: alert and cooperative Neurologic: intact Heart: regular rate and rhythm, S1, S2 normal, no murmur, click, rub or gallop Lungs: diminished breath sounds bibasilar Abdomen: soft, non-tender; bowel sounds normal; no masses,  no organomegaly Extremities: edema mild Wound: incisions ok  Lab Results:  Basename 06/18/11 0430 06/17/11 0555  WBC 11.4* 13.1*  HGB 10.2* 10.7*  HCT 30.8* 31.6*  PLT 270 PLATELET CLUMPS NOTED ON SMEAR, COUNT APPEARS ADEQUATE   BMET:  Basename 06/18/11 0430 06/17/11 0555  NA 133* 131*  K 3.4* 3.7  CL 96 97  CO2 28 24  GLUCOSE 82 84  BUN 12 11  CREATININE 0.97 0.89  CALCIUM 8.1* 8.4    PT/INR: No results found for this basename: LABPROT,INR in the last 72 hours ABG    Component Value Date/Time   PHART 7.376 06/14/2011 2114   HCO3 21.9 06/14/2011 2114   TCO2 23 06/14/2011 2114   ACIDBASEDEF 3.0* 06/14/2011 2114   O2SAT 96.0 06/14/2011 2114   CBG (last 3)   Basename 06/18/11 0755 06/18/11 0407 06/18/11 0012  GLUCAP 167* 92 117*    Assessment/Plan: S/P Procedure(s) (LRB): CORONARY ARTERY BYPASS GRAFTING (CABG) (N/A) Slow  progress. Continue PT/OT Diuresis Nausea could be due to gallstones.  Will try some Reglan.   LOS: 17 days    BARTLE,BRYAN K 06/18/2011

## 2011-06-19 LAB — CBC
MCH: 30.3 pg (ref 26.0–34.0)
MCHC: 33 g/dL (ref 30.0–36.0)
Platelets: 304 10*3/uL (ref 150–400)
RDW: 14.5 % (ref 11.5–15.5)

## 2011-06-19 LAB — GLUCOSE, CAPILLARY
Glucose-Capillary: 103 mg/dL — ABNORMAL HIGH (ref 70–99)
Glucose-Capillary: 134 mg/dL — ABNORMAL HIGH (ref 70–99)
Glucose-Capillary: 217 mg/dL — ABNORMAL HIGH (ref 70–99)
Glucose-Capillary: 85 mg/dL (ref 70–99)

## 2011-06-19 LAB — BASIC METABOLIC PANEL
CO2: 27 mEq/L (ref 19–32)
Chloride: 97 mEq/L (ref 96–112)
Creatinine, Ser: 0.89 mg/dL (ref 0.50–1.10)
Sodium: 134 mEq/L — ABNORMAL LOW (ref 135–145)

## 2011-06-19 MED ORDER — POTASSIUM CHLORIDE 10 MEQ/50ML IV SOLN: 10.0000 meq | INTRAVENOUS | Status: DC | PRN

## 2011-06-19 MED ORDER — SODIUM CHLORIDE 0.9 % IJ SOLN
3.0000 mL | Freq: Two times a day (BID) | INTRAMUSCULAR | Status: DC
  Administered 2011-06-19 – 2011-06-24 (×10): 3 mL via INTRAVENOUS
  Administered 2011-06-24: 10 mL via INTRAVENOUS
  Administered 2011-06-25 – 2011-06-26 (×3): 3 mL via INTRAVENOUS

## 2011-06-19 MED ORDER — INSULIN ASPART 100 UNIT/ML ~~LOC~~ SOLN
0.0000 [IU] | Freq: Three times a day (TID) | SUBCUTANEOUS | Status: DC
  Administered 2011-06-19 – 2011-06-21 (×5): 2 [IU] via SUBCUTANEOUS
  Administered 2011-06-21: 8 [IU] via SUBCUTANEOUS
  Administered 2011-06-22 (×2): 4 [IU] via SUBCUTANEOUS
  Administered 2011-06-22 – 2011-06-23 (×3): 2 [IU] via SUBCUTANEOUS
  Administered 2011-06-23 – 2011-06-24 (×2): 4 [IU] via SUBCUTANEOUS
  Administered 2011-06-25 – 2011-06-26 (×4): 2 [IU] via SUBCUTANEOUS
  Filled 2011-06-19: qty 3

## 2011-06-19 MED ORDER — POTASSIUM CHLORIDE 10 MEQ/50ML IV SOLN
INTRAVENOUS | Status: AC
  Administered 2011-06-19: 10 meq via INTRAVENOUS
  Filled 2011-06-19: qty 50

## 2011-06-19 MED ORDER — SODIUM CHLORIDE 0.9 % IV SOLN: 250.0000 mL | INTRAVENOUS | Status: DC | PRN

## 2011-06-19 MED ORDER — ACETAMINOPHEN 325 MG PO TABS: 650.0000 mg | ORAL_TABLET | Freq: Four times a day (QID) | ORAL | Status: DC | PRN

## 2011-06-19 MED ORDER — TRAMADOL HCL 50 MG PO TABS
50.0000 mg | ORAL_TABLET | ORAL | Status: DC | PRN
  Administered 2011-06-22: 100 mg via ORAL
  Filled 2011-06-19: qty 2

## 2011-06-19 MED ORDER — SIMVASTATIN 20 MG PO TABS
20.0000 mg | ORAL_TABLET | Freq: Every day | ORAL | Status: DC
  Administered 2011-06-20 – 2011-06-25 (×7): 20 mg via ORAL
  Filled 2011-06-19 (×7): qty 1

## 2011-06-19 MED ORDER — METOPROLOL TARTRATE 12.5 MG HALF TABLET
12.5000 mg | ORAL_TABLET | Freq: Two times a day (BID) | ORAL | Status: DC
  Administered 2011-06-19 – 2011-06-20 (×2): 12.5 mg via ORAL
  Filled 2011-06-19 (×3): qty 1

## 2011-06-19 MED ORDER — DOCUSATE SODIUM 100 MG PO CAPS
200.0000 mg | ORAL_CAPSULE | Freq: Every day | ORAL | Status: DC
  Administered 2011-06-23 – 2011-06-26 (×2): 200 mg via ORAL
  Filled 2011-06-19 (×8): qty 2

## 2011-06-19 MED ORDER — POTASSIUM CHLORIDE CRYS ER 20 MEQ PO TBCR
20.0000 meq | EXTENDED_RELEASE_TABLET | Freq: Every day | ORAL | Status: DC
  Administered 2011-06-19 – 2011-06-20 (×2): 20 meq via ORAL
  Filled 2011-06-19 (×2): qty 1

## 2011-06-19 MED ORDER — POVIDONE-IODINE 10 % EX SOLN
1.0000 "application " | Freq: Two times a day (BID) | CUTANEOUS | Status: DC
  Administered 2011-06-19 – 2011-06-26 (×14): 1 via TOPICAL
  Filled 2011-06-19: qty 15

## 2011-06-19 MED ORDER — MOVING RIGHT ALONG BOOK
Freq: Once | Status: AC
  Administered 2011-06-19: 22:00:00
  Filled 2011-06-19: qty 1

## 2011-06-19 MED ORDER — SODIUM CHLORIDE 0.9 % IJ SOLN
3.0000 mL | INTRAMUSCULAR | Status: DC | PRN
  Administered 2011-06-24: 10 mL via INTRAVENOUS

## 2011-06-19 MED ORDER — POTASSIUM CHLORIDE 10 MEQ/50ML IV SOLN
10.0000 meq | INTRAVENOUS | Status: DC | PRN
  Administered 2011-06-19: 10 meq via INTRAVENOUS
  Filled 2011-06-19: qty 50

## 2011-06-19 NOTE — Progress Notes (Signed)
5 Days Post-Op Procedure(s) (LRB): CORONARY ARTERY BYPASS GRAFTING (CABG) (N/A) Subjective: Feeling better, ate some breakfast. Nausea better.  Objective: Vital signs in last 24 hours: Temp:  [97.4 F (36.3 C)-98.7 F (37.1 C)] 98 F (36.7 C) (01/15 0808) Pulse Rate:  [101-111] 111  (01/15 0700) Cardiac Rhythm:  [-] Sinus tachycardia (01/15 0700) Resp:  [16-28] 22  (01/15 0700) BP: (84-122)/(45-76) 109/68 mmHg (01/15 0700) SpO2:  [88 %-98 %] 92 % (01/15 0700)  Hemodynamic parameters for last 24 hours:    Intake/Output from previous day: 01/14 0701 - 01/15 0700 In: 844 [P.O.:120; I.V.:320; IV Piggyback:404] Out: 2360 [Urine:2360] Intake/Output this shift:    General appearance: alert and cooperative Heart: regular rate and rhythm, S1, S2 normal, no murmur, click, rub or gallop Lungs: diminished breath sounds bibasilar Abdomen: soft, non-tender; bowel sounds normal; no masses,  no organomegaly Extremities: edema mild in legs Wound: incisions ok  Lab Results:  Basename 06/19/11 0812 06/18/11 0430  WBC 13.2* 11.4*  HGB 12.6 10.2*  HCT 38.2 30.8*  PLT 304 270   BMET:  Basename 06/18/11 0430 06/17/11 0555  NA 133* 131*  K 3.4* 3.7  CL 96 97  CO2 28 24  GLUCOSE 82 84  BUN 12 11  CREATININE 0.97 0.89  CALCIUM 8.1* 8.4    PT/INR: No results found for this basename: LABPROT,INR in the last 72 hours ABG    Component Value Date/Time   PHART 7.376 06/14/2011 2114   HCO3 21.9 06/14/2011 2114   TCO2 23 06/14/2011 2114   ACIDBASEDEF 3.0* 06/14/2011 2114   O2SAT 96.0 06/14/2011 2114   CBG (last 3)   Basename 06/19/11 0805 06/19/11 0333 06/19/11 0012  GLUCAP 102* 85 103*    Assessment/Plan: S/P Procedure(s) (LRB): CORONARY ARTERY BYPASS GRAFTING (CABG) (N/A) She is improving. Will transfer to [facility].  Continue PT/OT   LOS: 18 days    [name] K 06/19/2011

## 2011-06-19 NOTE — Progress Notes (Signed)
Pt transferred to 2018 via wheelchair on RA.  VS remain stable and patient tolerated well.  No complications.  Report given to receiving RN.

## 2011-06-19 NOTE — Progress Notes (Signed)
Physical Therapy Treatment Patient Details Name: [name] MRN: [id number] DOB: November 20, 1940 Today's Date: 06/19/2011  PT Assessment/Plan  PT - Assessment/Plan Comments on Treatment Session: Limited by elevated HR today (140s). Discharge plan will depend on how pt is doing following cholesystectomy. If pt can reach a minA level, likely she can go home with her husbands help and HHPT but we will wait to make our recommendations until it is determined whether she will have that surgery while here.  PT Goals  Acute Rehab PT Goals PT Goal: Supine/Side to Sit - Progress:  (no addressed today) PT Goal: Sit to Supine/Side - Progress: Progressing toward goal PT Goal: Sit to Stand - Progress: Progressing toward goal PT Goal: Stand to Sit - Progress: Progressing toward goal PT Transfer Goal: Bed to Chair/Chair to Bed - Progress: Progressing toward goal PT Goal: Stand - Progress: Progressing toward goal PT Goal: Ambulate - Progress: Not progressing (not progressing because of medical complications) PT Goal: Up/Down Stairs - Progress: Not progressing PT Goal: Perform Home Exercise Program - Progress: Progressing toward goal  PT Treatment Precautions/Restrictions  Precautions Precautions: Sternal;Fall Restrictions Weight Bearing Restrictions: Yes RUE Weight Bearing: Non weight bearing Other Position/Activity Restrictions: ROM out of splint per orders Mobility (including Balance) Bed Mobility Bed Mobility: Yes Sit to Sidelying Left: 1: +2 Total assist;HOB elevated (comment degrees) (30 degrees) Sit to Sidelying Left Details (indicate cue type and reason): assist to elevate legs to bed and guide trunk down as she held pillow against her chest Scooting to [facility]: 1: +2 Total assist (50%) Scooting to [facility] Details (indicate cue type and reason): pt able to bridge to push self to [facility] with assist while holding pillow to prevent pushing through elbow Transfers Sit to Stand: 1: +2 Total assist;From  chair/3-in-1 Sit to Stand Details (indicate cue type and reason): +2totalpt50%; pt with more difficulty today needing faciliation bilaterally for follow through to stand and anterior translation; as soon as we stood her heart rate jumped to 140s and pt feeling woozy so we sat down after about 20 seconds into the chair (pt holding onto pillow throughout session) Stand to Sit: To chair/3-in-1;To bed;3: Mod assist Stand to Sit Details: modA to control descent to chair with faciliation at hips Stand Pivot Transfers: 1: +2 Total assist Stand Pivot Transfer Details (indicate cue type and reason): +2totalpt70%; pt holding onto pill throughout; HR stayed close to 130 throughout transfer but then pt reporting she was feeling hot and her HR increased to 140s so pt sat EOB with modA while holding onto pillow; Ambulation/Gait Ambulation/Gait: No (limited by HR today)  Posture/Postural Control Posture/Postural Control: No significant limitations Static Sitting Balance Static Sitting - Balance Support: No upper extremity supported Static Sitting - Level of Assistance: 5: Stand by assistance Static Standing Balance Static Standing - Balance Support: No upper extremity supported Static Standing - Level of Assistance: 5: Stand by assistance Exercise  Total Joint Exercises Ankle Circles/Pumps: AROM;Both;10 reps;Seated Long Arc Quad: AROM;Both;10 reps;Seated General Exercises - Upper Extremity Elbow Flexion: AAROM;Both;Seated;10 reps Elbow Extension: AAROM;Right;Seated;10 reps Wrist Flexion: AROM;5 reps;Right Wrist Extension: AROM;Right;5 reps End of Session PT - End of Session Equipment Utilized During Treatment: Gait belt Activity Tolerance: Treatment limited secondary to medical complications (Comment) (HR resting in upper 120s and then rose to 140s with standing) Patient left: in bed Nurse Communication: Mobility status for transfers General Behavior During Session:  (anxious) Cognition: [facility] for  tasks performed  [facility] HELEN 06/19/2011, 2:57 PM

## 2011-06-19 NOTE — Progress Notes (Signed)
RT consult per MD order. Pt sleeping soundly and in no distress on RA at this time. Pt has slightly diminished BS and Sp02 of 92%. Pt resting comfortably. RT could find no documented Pulmonary history. RT will continue to monitor.

## 2011-06-20 LAB — GLUCOSE, CAPILLARY
Glucose-Capillary: 97 mg/dL (ref 70–99)
Glucose-Capillary: 156 mg/dL — ABNORMAL HIGH (ref 70–99)

## 2011-06-20 LAB — CBC
Hemoglobin: 11 g/dL — ABNORMAL LOW (ref 12.0–15.0)
Platelets: 323 10*3/uL (ref 150–400)
RBC: 3.68 MIL/uL — ABNORMAL LOW (ref 3.87–5.11)
WBC: 11.1 10*3/uL — ABNORMAL HIGH (ref 4.0–10.5)

## 2011-06-20 LAB — BASIC METABOLIC PANEL
CO2: 33 mEq/L — ABNORMAL HIGH (ref 19–32)
Calcium: 9.3 mg/dL (ref 8.4–10.5)
GFR calc non Af Amer: 49 mL/min — ABNORMAL LOW (ref >90)
Glucose, Bld: 94 mg/dL (ref 70–99)
Potassium: 4.7 mEq/L (ref 3.5–5.1)
Sodium: 136 mEq/L (ref 135–145)

## 2011-06-20 MED ORDER — INSULIN GLARGINE 100 UNIT/ML ~~LOC~~ SOLN
10.0000 [IU] | Freq: Every day | SUBCUTANEOUS | Status: DC
  Administered 2011-06-20: 10 [IU] via SUBCUTANEOUS
  Filled 2011-06-20: qty 3

## 2011-06-20 MED ORDER — FUROSEMIDE 40 MG PO TABS
40.0000 mg | ORAL_TABLET | Freq: Every day | ORAL | Status: DC
  Administered 2011-06-20 – 2011-06-25 (×6): 40 mg via ORAL
  Filled 2011-06-20 (×6): qty 1

## 2011-06-20 MED ORDER — POTASSIUM CHLORIDE CRYS ER 20 MEQ PO TBCR
20.0000 meq | EXTENDED_RELEASE_TABLET | Freq: Every day | ORAL | Status: DC
  Administered 2011-06-21 – 2011-06-25 (×5): 20 meq via ORAL
  Filled 2011-06-20 (×5): qty 1

## 2011-06-20 MED ORDER — METFORMIN HCL 850 MG PO TABS
850.0000 mg | ORAL_TABLET | Freq: Two times a day (BID) | ORAL | Status: DC
  Administered 2011-06-20 – 2011-06-26 (×12): 850 mg via ORAL
  Filled 2011-06-20 (×15): qty 1

## 2011-06-20 MED ORDER — METOPROLOL TARTRATE 25 MG PO TABS
25.0000 mg | ORAL_TABLET | Freq: Two times a day (BID) | ORAL | Status: DC
  Administered 2011-06-20: 25 mg via ORAL
  Filled 2011-06-20 (×3): qty 1

## 2011-06-20 NOTE — Progress Notes (Signed)
Patient ambulated approximately 90 ft with rolling walker and RN. Patient tolerated first portion of walk well, but stated that she felt weak and shaky during the last portion of the walk.  Placed patient in wheelchair and returned her to her room.  Pt's HR was in 140's during the walk.  Left resting comfortably in bed with call bell within reach, will continue to monitor.  [name]

## 2011-06-20 NOTE — Progress Notes (Addendum)
[address]            [place] [address]          [phone]     6 Days Post-Op Procedure(s) (LRB): CORONARY ARTERY BYPASS GRAFTING (CABG) (N/A)  Subjective: Feels a little better today.  No nausea, and appetite is improving.  Still feels weak when up and walking.  Objective: Vital signs in last 24 hours: Patient Vitals for the past 24 hrs:  BP Temp Temp src Pulse Resp SpO2 Weight  06/20/11 0432 144/77 mmHg 98.8 F (37.1 C) Oral 108  19  92 % 97.206 kg (214 lb 4.8 oz)  06/19/11 2014 106/57 mmHg 98.2 F (36.8 C) Oral 115  20  97 % -  06/19/11 1900 96/55 mmHg - - 120  24  92 % -  06/19/11 1800 108/54 mmHg - - 112  18  92 % -  06/19/11 1700 106/63 mmHg - - 146  33  96 % -  06/19/11 1601 - 98.9 F (37.2 C) Oral - - - -  06/19/11 1600 92/53 mmHg - - 110  21  91 % -  06/19/11 1500 114/57 mmHg - - 111  20  91 % -  06/19/11 1400 104/50 mmHg - - 120  28  96 % -  06/19/11 1300 92/51 mmHg - - 112  17  90 % -  06/19/11 1238 - 97.6 F (36.4 C) Oral - - - -  06/19/11 1200 103/59 mmHg - - 110  19  93 % -  06/19/11 1100 120/96 mmHg - - 118  22  94 % -  06/19/11 1020 - - - 140  - - -  06/19/11 1000 91/51 mmHg - - 122  22  93 % -  06/19/11 0900 - - - 111  20  95 % -  06/19/11 0808 - 98 F (36.7 C) Axillary - - - -  06/19/11 0800 105/54 mmHg - - 107  17  95 % -   Current Weight  06/20/11 97.206 kg (214 lb 4.8 oz)     Intake/Output from previous day: 01/15 0701 - 01/16 0700 In: 237 [P.O.:110; I.V.:123; IV Piggyback:4] Out: 1251 [Urine:1250; Stool:1]  CBGs 215-134-94-97  PHYSICAL EXAM:  Heart: RRR Lungs: clear, slightly decreased in bases Wound: clean and dry Extremities: mild LE edema  Lab Results: CBC: Basename 06/20/11 0545 06/19/11 0812  WBC 11.1* 13.2*  HGB 11.0* 12.6  HCT 34.1* 38.2  PLT 323 304   BMET:  Basename 06/20/11 0545 06/19/11 0812  NA 136 134*  K 4.7 4.2  CL 98 97  CO2 33* 27  GLUCOSE 94 109*  BUN 17 14    CREATININE 1.11* 0.89  CALCIUM 9.3 9.0    PT/INR: No results found for this basename: LABPROT,INR in the last 72 hours   Assessment/Plan: S/P Procedure(s) (LRB): CORONARY ARTERY BYPASS GRAFTING (CABG) (N/A) CV- stable.  Continue current meds. GI- symptoms improving.  Monitor. Vol overload- continue diuresis. Hgb A1C=7.1 Continue Lantus, SSI for now.  ?will need po agent once eating better. PT/OT   LOS: 19 days    COLLINS,GINA H 06/20/2011    Chart reviewed, patient examined, agree with above. Will start glucophage and decrease Lantus to 10. HR still a little high.  Will increase lopressor to 25 bid. Still has some leg edema although weight is below preop.  Will order lasix and kcl for a few more days.

## 2011-06-20 NOTE — Progress Notes (Signed)
Occupational Therapy Treatment Patient Details Name: [name] MRN: [id number] DOB: 06-28-1940 Today's Date: 06/20/2011  OT Assessment/Plan OT Assessment/Plan Comments on Treatment Session: Pt continues to be limited by inc HR with activity. OT Plan: Discharge plan needs to be updated OT Frequency: Min 2X/week Follow Up Recommendations: Home health OT Equipment Recommended: None recommended by OT OT Goals ADL Goals ADL Goal: Toilet Transfer - Progress: Progressing toward goals Arm Goals Arm Goal: Additional Goal #1 - Progress: Progressing toward goals  OT Treatment Precautions/Restrictions  Restrictions RUE Weight Bearing: Non weight bearing   ADL ADL Toilet Transfer: Simulated;Minimal assistance Toilet Transfer Details (indicate cue type and reason): Pt with BM in bed; simulated EOB to chair. VC for hand placement and to assist patient with LOB x 2 Toilet Transfer Method: Stand pivot Tub/Shower Transfer: Not assessed Ambulation Related to ADLs: Min hand-held assist with ambulation x 71ft before pt HR inc to 145 bpm. Had patient rest until HR dec to 130's then returned patient to chair. ADL Comments: Patient continues to be limited by inc HR.  Mobility  Bed Mobility Rolling Left: 3: Mod assist Rolling Left Details (indicate cue type and reason): Pt holding pillow with bil UE's. Instructed patient to turn head to left and bend LLE up and push through roll.  Left Sidelying to Sit: 3: Mod assist;HOB flat Left Sidelying to Sit Details (indicate cue type and reason): pt doing better job of activating trunk through side to sit Transfers Sit to Stand: 4: Min assist;From bed Sit to Stand Details (indicate cue type and reason): VC for hand placement and to translate UB over LB Stand to Sit: 4: Min assist;To chair/3-in-1 Stand to Sit Details: VC for hand placement Exercises General Exercises - Upper Extremity Shoulder Horizontal ABduction: AAROM;Right;10 reps;Seated Shoulder  Horizontal ADduction: AAROM;Right;10 reps;Seated Elbow Flexion: AAROM;Both;Seated;10 reps Elbow Extension: AAROM;Right;Seated;10 reps Wrist Flexion: AROM;Right;10 reps;Seated Wrist Extension: AROM;Right;10 reps;Seated  End of Session OT - End of Session Equipment Utilized During Treatment: Gait belt Activity Tolerance: Patient limited by fatigue (limited by HR) Patient left: in chair;with call bell in reach;with family/visitor present Nurse Communication: Mobility status for transfers;Mobility status for ambulation General Behavior During Session: [facility] for tasks performed Cognition: [facility] for tasks performed  [name]  06/20/2011, 4:37 PM

## 2011-06-20 NOTE — Progress Notes (Signed)
CARDIAC REHAB PHASE I   PRE:  Rate/Rhythm: 110 ST    BP: sitting 129/58    SaO2: 92  RA  MODE:  Ambulation: 150 ft   POST:  Rate/Rhythm: 132 ST    BP: sitting 131/62     SaO2: 95 RA  Pt sts her hardest thing is standing from low chair/bed. Used gait belt and assist x2 and correct body mechanics, which pt sts it was easier. Used RW and assist x2. Pt weak with HR up to 132 ST. Tired after walk. Return to bed.  [id number]  [name] CES, ACSM

## 2011-06-20 NOTE — Plan of Care (Signed)
Problem: Phase III Progression Outcomes Goal: Time patient transferred to PCTU/Telemetry POD POD 5 PTCU at 2000.

## 2011-06-20 NOTE — Progress Notes (Signed)
Patient discussed at the Long Length of Stay [name] Weeks 06/20/2011  

## 2011-06-20 NOTE — Plan of Care (Signed)
Problem: Phase III Progression Outcomes Goal: Transfer to PCTU/Telemetry POD Outcome: Completed/Met Date Met:  06/20/11 POD 5

## 2011-06-21 LAB — GLUCOSE, CAPILLARY

## 2011-06-21 MED ORDER — METOPROLOL TARTRATE 25 MG PO TABS
25.0000 mg | ORAL_TABLET | Freq: Three times a day (TID) | ORAL | Status: DC
  Administered 2011-06-21 – 2011-06-25 (×10): 25 mg via ORAL
  Filled 2011-06-21 (×15): qty 1

## 2011-06-21 NOTE — Progress Notes (Signed)
Physical Therapy Treatment Patient Details Name: [name] MRN: [id number] DOB: 03-27-1941 Today's Date: 06/21/2011  PT Assessment/Plan  PT - Assessment/Plan Comments on Treatment Session: Pt with R elbow fx, CABG. Pt continues to be limited by tachycardia with any activity. HR 118 at rest up to 147 with limited in room ambulation. Pt educated for NWB RUE and use of HHA and pt encouraged to continue LE HEP as able throughout day. RN aware of all of the  above. PT Plan: Discharge plan remains appropriate Follow Up Recommendations: Inpatient Rehab (pending sx and medical stability) PT Goals  Acute Rehab PT Goals PT Goal: Sit to Stand - Progress: Progressing toward goal Pt will go Stand to Sit: with supervision PT Goal: Stand to Sit - Progress: Updated due to goals met PT Transfer Goal: Bed to Chair/Chair to Bed - Progress: Progressing toward goal Pt will Stand: with modified independence PT Goal: Stand - Progress: Updated due to goal met Pt will Ambulate: >150 feet;with min assist;with least restrictive assistive device PT Goal: Ambulate - Progress: Revised due to lack of progress PT Goal: Up/Down Stairs - Progress: Not progressing (not progressing secondary to HR) PT Goal: Perform Home Exercise Program - Progress: Progressing toward goal  PT Treatment Precautions/Restrictions  Precautions Precautions: Fall;Sternal Restrictions Weight Bearing Restrictions: Yes RUE Weight Bearing: Non weight bearing Other Position/Activity Restrictions: ROM out of splint per orders Mobility (including Balance) Bed Mobility Bed Mobility: No Transfers Sit to Stand: 3: Mod assist;From chair/3-in-1 Sit to Stand Details (indicate cue type and reason): cueing to scoot to edge of surface first, cueing for hand placement and anterior weight shift, 2 & 3 trial from chair after elevating surface with blankets pt with improved transfer Stand to Sit: 4: Min assist;To chair/3-in-1 Stand to Sit Details:  cueing for hand placement and controlled descent Ambulation/Gait Ambulation/Gait Assistance: 4: Min assist Ambulation/Gait Assistance Details (indicate cue type and reason): HR 118 at rest up to 147 with limited 36ft ambulation. RN aware, pt had not received meds yet Ambulation Distance (Feet): 30 Feet (x 2 trials, limited by HR) Assistive device: 1 person hand held assist Gait Pattern: Decreased stride length Stairs: No    Exercise  General Exercises - Lower Extremity Long Arc Quad: AROM;Both;20 reps;Seated Hip Flexion/Marching: AROM;Both;Other reps (comment);Seated (20reps) End of Session PT - End of Session Equipment Utilized During Treatment: Gait belt Activity Tolerance: Treatment limited secondary to medical complications (Comment) (HR high 140s with amb) Patient left: in chair;with call bell in reach;with family/visitor present Nurse Communication: Mobility status for transfers;Mobility status for ambulation General Behavior During Session: [facility] for tasks performed Cognition: [facility] for tasks performed  Delorse Lek 06/21/2011, 10:29 AM Toney Sang, PT [phone]

## 2011-06-21 NOTE — Progress Notes (Signed)
UR Completed.  Tyrea Froberg Jane [phone] 06/21/2011  

## 2011-06-21 NOTE — Progress Notes (Addendum)
                    [address]            [place] [address]          [phone]     7 Days Post-Op Procedure(s) (LRB): CORONARY ARTERY BYPASS GRAFTING (CABG) (N/A)  Subjective: Still weak with ambulation, but otherwise feels ok.  HR goes up when walking.  Objective: Vital signs in last 24 hours: Patient Vitals for the past 24 hrs:  BP Temp Temp src Pulse Resp SpO2 Weight  06/21/11 0549 127/63 mmHg 98.2 F (36.8 C) Oral 102  16  90 % 93.9 kg (207 lb 0.2 oz)  06/20/11 2252 113/58 mmHg - - 108  - - -  06/20/11 2117 98/54 mmHg 97.6 F (36.4 C) Oral 113  20  93 % -  06/20/11 1500 131/62 mmHg 98.3 F (36.8 C) Oral 104  18  92 % -  06/20/11 1049 110/72 mmHg - - 115  - - -   Current Weight  06/21/11 93.9 kg (207 lb 0.2 oz)     Intake/Output from previous day: 01/16 0701 - 01/17 0700 In: 243 [P.O.:240; I.V.:3] Out: 1900 [Urine:1900]  CBGs 562-130-86  PHYSICAL EXAM:  Heart: RRR, sl tachy  100-110 Lungs: slightly decreased BS in bases Wound: clean and dry Extremities: mild LE edema, R>L  Lab Results: CBC: Basename 06/20/11 0545 06/19/11 0812  WBC 11.1* 13.2*  HGB 11.0* 12.6  HCT 34.1* 38.2  PLT 323 304   BMET:  Basename 06/20/11 0545 06/19/11 0812  NA 136 134*  K 4.7 4.2  CL 98 97  CO2 33* 27  GLUCOSE 94 109*  BUN 17 14  CREATININE 1.11* 0.89  CALCIUM 9.3 9.0    PT/INR: No results found for this basename: LABPROT,INR in the last 72 hours   Assessment/Plan: S/P Procedure(s) (LRB): CORONARY ARTERY BYPASS GRAFTING (CABG) (N/A) CV- having some tachycardia at rest, and HR 120-140s when ambulating.  Will try to titrate beta blocker as tolerated. Vol overload- diurese DM- started Glucophage, sugars ok.  Will d/c Lantus and watch. PT/OT   LOS: 20 days    COLLINS,GINA H 06/21/2011    Chart reviewed, patient examined, agree with above.

## 2011-06-21 NOTE — Progress Notes (Signed)
CARDIAC REHAB PHASE I   PRE:  Rate/Rhythm: 100 SR  BP:  Supine:   Sitting: 119/54  Standing:     SaO2: 97 RA  MODE:  Ambulation: 150 ft   POST:  Rate/Rhythem: 129 ST  BP:  Supine:   Sitting: 112/63  Standing:    SaO2: 98 RA 1315-1350  Assisted X 2 and used walker to ambulate. Gait steady with walker. Pt has difficult time getting to standing position, once up able to walk well. Heart rate better controlled this pm with walking. Pt to bed after walk with call light in reach.  [name]

## 2011-06-22 LAB — GLUCOSE, CAPILLARY
Glucose-Capillary: 142 mg/dL — ABNORMAL HIGH (ref 70–99)
Glucose-Capillary: 163 mg/dL — ABNORMAL HIGH (ref 70–99)
Glucose-Capillary: 197 mg/dL — ABNORMAL HIGH (ref 70–99)

## 2011-06-22 NOTE — Progress Notes (Signed)
1410 - pt ambulated 150 ft x 1 assist, steady gait, pt weak and DOE. Pt stopped and rested twice during walk, requested to lay in bed (after being in chair all day), call bell within reach, will continue to monitor.  Ninetta Lights 06/22/2011 3:09 PM

## 2011-06-22 NOTE — Progress Notes (Signed)
8 Days Post-Op Procedure(s) (LRB): CORONARY ARTERY BYPASS GRAFTING (CABG) (N/A) Subjective: Feeling stronger but still takes 2 people to get her up and walking.  Had BM this am.  Objective: Vital signs in last 24 hours: Temp:  [97.4 F (36.3 C)-97.5 F (36.4 C)] 97.4 F (36.3 C) (01/18 0533) Pulse Rate:  [103-147] 103  (01/18 0533) Cardiac Rhythm:  [-] Sinus tachycardia (01/18 0730) Resp:  [18-20] 20  (01/18 0533) BP: (95-116)/(51-66) 113/66 mmHg (01/18 0533) SpO2:  [90 %-94 %] 90 % (01/18 0533) Weight:  [93 kg (205 lb 0.4 oz)] 93 kg (205 lb 0.4 oz) (01/18 0533)  Hemodynamic parameters for last 24 hours:    Intake/Output from previous day: 01/17 0701 - 01/18 0700 In: 963 [P.O.:960; I.V.:3] Out: 1950 [Urine:1950] Intake/Output this shift:    General appearance: alert and cooperative Heart: regular rate and rhythm, S1, S2 normal, no murmur, click, rub or gallop Lungs: clear to auscultation bilaterally Extremities: edema mild in legs Wound: incision ok  Lab Results:  Basename 06/20/11 0545  WBC 11.1*  HGB 11.0*  HCT 34.1*  PLT 323   BMET:  Basename 06/20/11 0545  NA 136  K 4.7  CL 98  CO2 33*  GLUCOSE 94  BUN 17  CREATININE 1.11*  CALCIUM 9.3    PT/INR: No results found for this basename: LABPROT,INR in the last 72 hours ABG    Component Value Date/Time   PHART 7.376 06/14/2011 2114   HCO3 21.9 06/14/2011 2114   TCO2 23 06/14/2011 2114   ACIDBASEDEF 3.0* 06/14/2011 2114   O2SAT 96.0 06/14/2011 2114   CBG (last 3)   Basename 06/22/11 0609 06/21/11 2103 06/21/11 1627  GLUCAP 142* 152* 80    Assessment/Plan: S/P Procedure(s) (LRB): CORONARY ARTERY BYPASS GRAFTING (CABG) (N/A) Mobilize Diuresis Diabetes control She needs to be able to get up and walk with minimal assistance to go home.  I don't know if she will improve enough over the next few days.  She may need SNF.   LOS: 21 days    BARTLE,BRYAN K 06/22/2011

## 2011-06-22 NOTE — Progress Notes (Signed)
CARDIAC REHAB PHASE I   PRE:  Rate/Rhythm: 107ST  BP:  Supine:   Sitting: 106/58  Standing:    SaO2: 96%RA  MODE:  Ambulation: 210 ft   POST:  Rate/Rhythem: 134-140ST  BP:  Supine:   Sitting: 115/69  Standing:    SaO2: 94%RA 0900-0940 Pt walked 210 ft on RA with rolling walker and asst x 2. We need gait belt to help stand. To bathroom after walk and prior to going to chair. HR elevated with activity. Pt tolerated increase distance well.  Duanne Limerick

## 2011-06-22 NOTE — Progress Notes (Signed)
Patient ambulated approximately 150 ft with rolling walker and RN.  Had trouble getting up and had some weakness in her left leg.  Once up, ambulated slowly and took three short breaks during the walk to catch her breath.  Pt's O2 sat still 96% on RA.  Left resting comfortably in bed with call bell within reach.  Will continue to monitor.  [name]

## 2011-06-23 LAB — GLUCOSE, CAPILLARY: Glucose-Capillary: 128 mg/dL — ABNORMAL HIGH (ref 70–99)

## 2011-06-23 NOTE — Progress Notes (Signed)
CARDIAC REHAB PHASE I   PRE:  Rate/Rhythm:107  BP:  Supine:  Sitting: 112/60  Standing:    SaO2: 96 Ra  MODE:  Ambulation: 200 ft   POST:  Rate/Rhythem: 136  BP:  Supine:   Sitting: 110/60  Standing:    SaO2: 97 RA  1110-1140 Ambulated with assist x 2 using gait belt.  Patient states it is a little easier to get up from chair today.  Several rest breaks, dyspnea while walking, encouraging PLB.  Returned to room to bed per patient request.  Call bell within reach.  Pt's husband visiting.   Jackey Loge

## 2011-06-23 NOTE — Progress Notes (Addendum)
[name]            [place] [address]          [phone]     9 Days Post-Op  Procedure(s) (LRB): CORONARY ARTERY BYPASS GRAFTING (CABG) (N/A) Subjective: Slowly feeling stronger , legs "give out" but less so  Objective  Telemetry sinus tach  Temp:  [97.2 F (36.2 C)-98.4 F (36.9 C)] 98.4 F (36.9 C) (01/19 0500) Pulse Rate:  [96-109] 96  (01/19 0500) Resp:  [18-19] 19  (01/19 0500) BP: (91-122)/(55-70) 122/70 mmHg (01/19 0500) SpO2:  [93 %-96 %] 93 % (01/19 0500) Weight:  [203 lb 0.7 oz (92.1 kg)] 203 lb 0.7 oz (92.1 kg) (01/19 9147)   Intake/Output Summary (Last 24 hours) at 06/23/11 0940 Last data filed at 06/23/11 0600  Gross per 24 hour  Intake    483 ml  Output   1300 ml  Net   -817 ml       General appearance: alert and no distress Heart: regular rate and rhythm, S1, S2 normal and tachy Lungs: mildly diminished in bases Abdomen: benign exam Extremities: minor LE edema Wound: incisions healing well  Lab Results: No results found for this basename: NA:2,K:2,CL:2,CO2:2,GLUCOSE:2,BUN:2,CREATININE:2,CALCIUM:2,MG:2,PHOS:2 in the last 72 hours No results found for this basename: AST:2,ALT:2,ALKPHOS:2,BILITOT:2,PROT:2,ALBUMIN:2 in the last 72 hours No results found for this basename: LIPASE:2,AMYLASE:2 in the last 72 hours No results found for this basename: WBC:2,NEUTROABS:2,HGB:2,HCT:2,MCV:2,PLT:2 in the last 72 hours No results found for this basename: CKTOTAL:4,CKMB:4,TROPONINI:4 in the last 72 hours No components found with this basename: POCBNP:3 No results found for this basename: DDIMER in the last 72 hours No results found for this basename: HGBA1C in the last 72 hours No results found for this basename: CHOL,HDL,LDLCALC,TRIG,CHOLHDL in the last 72 hours No results found for this basename: TSH,T4TOTAL,FREET3,T3FREE,THYROIDAB in the last 72 hours No results found for this basename:  VITAMINB12,FOLATE,FERRITIN,TIBC,IRON,RETICCTPCT in the last 72 hours  Medications: Scheduled    . aspirin EC  325 mg Oral Daily   Or  . aspirin  324 mg Per Tube Daily  . docusate sodium  200 mg Oral Daily  . enoxaparin  40 mg Subcutaneous QHS  . feeding supplement  237 mL Oral TID WC  . furosemide  40 mg Oral Daily  . insulin aspart  0-24 Units Subcutaneous TID AC & HS  . metFORMIN  850 mg Oral BID WC  . metoCLOPramide  10 mg Oral TID AC & HS  . metoprolol tartrate  25 mg Oral TID  . pantoprazole sodium  40 mg Oral Q1200  . potassium chloride  20 mEq Oral Daily  . povidone-iodine  1 application Topical BID  . predniSONE  5 mg Oral Q breakfast  . simvastatin  20 mg Oral q1800  . sodium chloride  3 mL Intravenous Q12H     Radiology/Studies:  No results found.  INR: Will add last result for INR, ABG once components are confirmed Will add last 4 CBG results once components are confirmed  Assessment/Plan:  LOS: 22 days    1. Doing well with slow progress, push rehab as able 2. Cont diuresis 3. cbg 128-163 range [name],[name] 1/19/20139:40 AM    Walked little better today I have seen and examined [name] and agree with the above assessment  and plan.  Delight Ovens MD [facility] [phone] Office [phone] 06/23/2011 6:52  PM

## 2011-06-24 LAB — GLUCOSE, CAPILLARY
Glucose-Capillary: 114 mg/dL — ABNORMAL HIGH (ref 70–99)
Glucose-Capillary: 170 mg/dL — ABNORMAL HIGH (ref 70–99)
Glucose-Capillary: 109 mg/dL — ABNORMAL HIGH (ref 70–99)
Glucose-Capillary: 104 mg/dL — ABNORMAL HIGH (ref 70–99)

## 2011-06-24 NOTE — Progress Notes (Addendum)
[address]            [place] [address]          [phone]     10 Days Post-Op  Procedure(s) (LRB): CORONARY ARTERY BYPASS GRAFTING (CABG) (N/A) Subjective: Feeling a little stronger  Objective  Telemetry Sinus tachy  Temp:  [97.2 F (36.2 C)-98.3 F (36.8 C)] 97.2 F (36.2 C) (01/20 0500) Pulse Rate:  [90-100] 100  (01/20 0500) Resp:  [16-18] 18  (01/20 0500) BP: (90-112)/(57-73) 110/73 mmHg (01/20 0500) SpO2:  [92 %-96 %] 92 % (01/20 0500) Weight:  [199 lb 1.2 oz (90.3 kg)] 199 lb 1.2 oz (90.3 kg) (01/20 0500)   Intake/Output Summary (Last 24 hours) at 06/24/11 0856 Last data filed at 06/24/11 0800  Gross per 24 hour  Intake    480 ml  Output    602 ml  Net   -122 ml       General appearance: alert, cooperative, fatigued and no distress Heart: regular rate and rhythm, S1, S2 normal and tachy Lungs: mildy diminished in the bases Abdomen: soft, non-tender; bowel sounds normal; no masses,  no organomegaly Extremities: minor LE edema Wound: incisions healing well   Lab Results: No results found for this basename: NA:2,K:2,CL:2,CO2:2,GLUCOSE:2,BUN:2,CREATININE:2,CALCIUM:2,MG:2,PHOS:2 in the last 72 hours No results found for this basename: AST:2,ALT:2,ALKPHOS:2,BILITOT:2,PROT:2,ALBUMIN:2 in the last 72 hours No results found for this basename: LIPASE:2,AMYLASE:2 in the last 72 hours No results found for this basename: WBC:2,NEUTROABS:2,HGB:2,HCT:2,MCV:2,PLT:2 in the last 72 hours No results found for this basename: CKTOTAL:4,CKMB:4,TROPONINI:4 in the last 72 hours No components found with this basename: POCBNP:3 No results found for this basename: DDIMER in the last 72 hours No results found for this basename: HGBA1C in the last 72 hours No results found for this basename: CHOL,HDL,LDLCALC,TRIG,CHOLHDL in the last 72 hours No results found for this basename: TSH,T4TOTAL,FREET3,T3FREE,THYROIDAB in the last 72 hours No results  found for this basename: VITAMINB12,FOLATE,FERRITIN,TIBC,IRON,RETICCTPCT in the last 72 hours  Medications: Scheduled    . aspirin EC  325 mg Oral Daily   Or  . aspirin  324 mg Per Tube Daily  . docusate sodium  200 mg Oral Daily  . enoxaparin  40 mg Subcutaneous QHS  . feeding supplement  237 mL Oral TID WC  . furosemide  40 mg Oral Daily  . insulin aspart  0-24 Units Subcutaneous TID AC & HS  . metFORMIN  850 mg Oral BID WC  . metoCLOPramide  10 mg Oral TID AC & HS  . metoprolol tartrate  25 mg Oral TID  . pantoprazole sodium  40 mg Oral Q1200  . potassium chloride  20 mEq Oral Daily  . povidone-iodine  1 application Topical BID  . predniSONE  5 mg Oral Q breakfast  . simvastatin  20 mg Oral q1800  . sodium chloride  3 mL Intravenous Q12H     Radiology/Studies:  No results found.  INR: Will add last result for INR, ABG once components are confirmed Will add last 4 CBG results once components are confirmed  Assessment/Plan: S/P Procedure(s) (LRB): CORONARY ARTERY BYPASS GRAFTING (CABG) (N/A) 1. CBG 78-181 range 2. Push rehab 3. Cont gentle diuresis, recheck labs   LOS: 23 days    GOLD,WAYNE E 1/20/20138:56 AM    I have seen and examined [name] and agree with the above assessment  and plan.  Ramon Dredge  Bari Kaylise Blakeley MD [facility] [phone] Office [phone] 06/24/2011 12:04 PM

## 2011-06-25 LAB — GLUCOSE, CAPILLARY: Glucose-Capillary: 142 mg/dL — ABNORMAL HIGH (ref 70–99)

## 2011-06-25 LAB — BASIC METABOLIC PANEL
BUN: 21 mg/dL (ref 6–23)
CO2: 25 mEq/L (ref 19–32)
Chloride: 100 mEq/L (ref 96–112)
Creatinine, Ser: 1.24 mg/dL — ABNORMAL HIGH (ref 0.50–1.10)
Glucose, Bld: 127 mg/dL — ABNORMAL HIGH (ref 70–99)

## 2011-06-25 MED ORDER — TRAMADOL HCL 50 MG PO TABS: 50.0000 mg | ORAL_TABLET | Freq: Four times a day (QID) | ORAL | Status: AC | PRN

## 2011-06-25 MED ORDER — METOPROLOL TARTRATE 25 MG PO TABS
25.0000 mg | ORAL_TABLET | Freq: Four times a day (QID) | ORAL | Status: DC
  Administered 2011-06-25 – 2011-06-26 (×4): 25 mg via ORAL
  Filled 2011-06-25 (×7): qty 1

## 2011-06-25 MED ORDER — METFORMIN HCL 850 MG PO TABS: 850.0000 mg | ORAL_TABLET | Freq: Two times a day (BID) | ORAL | Status: DC

## 2011-06-25 MED ORDER — METOPROLOL TARTRATE 25 MG PO TABS: 50.0000 mg | ORAL_TABLET | Freq: Two times a day (BID) | ORAL | Status: DC

## 2011-06-25 MED ORDER — SIMVASTATIN 40 MG PO TABS: 20.0000 mg | ORAL_TABLET | Freq: Every day | ORAL | Status: DC

## 2011-06-25 NOTE — Progress Notes (Addendum)
11 Days Post-Op Procedure(s) (LRB): CORONARY ARTERY BYPASS GRAFTING (CABG) (N/A)  Subjective: Patient feeling a little stronger each day.  Objective: Vital signs in last 24 hours: Patient Vitals for the past 24 hrs:  BP Temp Temp src Pulse Resp SpO2 Weight  06/25/11 0431 113/76 mmHg 97.5 F (36.4 C) Oral 98  18  91 % 196 lb 12.8 oz (89.268 kg)  06/24/11 2103 124/76 mmHg 97.6 F (36.4 C) Oral 102  18  92 % -  06/24/11 1411 130/82 mmHg 97.5 F (36.4 C) Oral 104  18  93 % -   Pre op weight  94 kg Current Weight  06/25/11 196 lb 12.8 oz (89.268 kg)       Intake/Output from previous day: 01/20 0701 - 01/21 0700 In: 480 [P.O.:480] Out: 1651 [Urine:1650; Stool:1]   Physical Exam:  Cardiovascular: Tachycardic;no murmurs, gallops, or rubs. Pulmonary: Clear to auscultation bilaterally; no rales, wheezes, or rhonchi. Abdomen: Soft, non tender, bowel sounds present. Extremities: Mild bilateral lower extremity edema. Wounds: Clean and dry.  No erythema or signs of infection.  Lab Results: CBC: Basename 06/25/11 0610  WBC 9.8  HGB 11.8*  HCT 37.0  PLT 306   BMET:  Basename 06/25/11 0610  NA 138  K 3.8  CL 100  CO2 25  GLUCOSE 127*  BUN 21  CREATININE 1.24*  CALCIUM 9.4    PT/INR: No results found for this basename: LABPROT,INR in the last 72 hours ABG:  INR: Will add last result for INR, ABG once components are confirmed Will add last 4 CBG results once components are confirmed  Assessment/Plan:  1. CV - ST.Will increase Lopressor to 50 bid. 2.  Pulmonary - Encourage incentive spirometer. 3.Patient below pre op weight. creatinine up to 1.24. Will hold diuresis for now. 4.DM-Pre op HGA1C 7.1.CBGs 109/104/115. Continue current medications. 5.Remove EPW and CT sutures. 6.Possible d/c am.    Doree Fudge MPA-C 06/25/2011    Chart reviewed, patient examined, agree with above. Home in am if no changes

## 2011-06-25 NOTE — Progress Notes (Signed)
EPW discontinued per protocol. Tips intact. Patient tolerated well. Last INR INR/Prothrombin Time on   .  Patient advised Bedrest X 1 hour. Ct sutures removed per protocol and steri strips applied.  [name]

## 2011-06-25 NOTE — Progress Notes (Signed)
Physical Therapy Treatment Patient Details Name: [name] MRN: [id number] DOB: 1940/06/08 Today's Date: 06/25/2011  PT Assessment/Plan  PT - Assessment/Plan Comments on Treatment Session: Pts RUE was out of splint when I arrived so I assisted with wrapping it. She had already been through bathing and was sitting EOB when I arrived. HR was resting mid 120s sitting EOB and pt complaining of fatigue. Once we stood her heart rate jumped into the upper 130s with pt c/o feeling woozy. She amb. approx 10 ft and HR increased to 140-141 so she sat in the recliner and we ended PT session. Per cardiology PA pt will likely d/c home tomorrow. Discussed this with the pt and with her husband and we will need to have Mr. Fuhrer practice assisting pt with bed mobility tomorrow prior to d/c. If she goes home she will need HHPT for f/u.  PT Plan: Discharge plan needs to be updated Follow Up Recommendations: Home health PT (if pt goes home she will need HHPT) Equipment Recommended: None recommended by PT PT Goals  Acute Rehab PT Goals PT Goal: Supine/Side to Sit - Progress:  (not addressed) PT Goal: Sit to Supine/Side - Progress:  (not addressed) Pt will go Sit to Stand: with modified independence PT Goal: Sit to Stand - Progress: Updated due to goal met PT Goal: Stand to Sit - Progress: Progressing toward goal PT Transfer Goal: Bed to Chair/Chair to Bed - Progress: Progressing toward goal PT Goal: Stand - Progress: Progressing toward goal PT Goal: Ambulate - Progress: Progressing toward goal PT Goal: Up/Down Stairs - Progress: Not progressing (HR too high) PT Goal: Perform Home Exercise Program - Progress: Progressing toward goal  PT Treatment Precautions/Restrictions  Precautions Precautions: Fall;Sternal Restrictions Weight Bearing Restrictions: Yes RUE Weight Bearing: Non weight bearing Other Position/Activity Restrictions: ROM out of splint per orders Mobility (including Balance) Bed  Mobility Bed Mobility: No (pt sitting EOB on my arrival) Transfers Sit to Stand: 4: Min assist Sit to Stand Details (indicate cue type and reason): pt using momentum of rocking AP to assist with sit->stand from lower surface while pt held cardiac pillow to limit UE use, min faciliation at ishial tuberosity on left for follow through to stand Stand to Sit: 4: Min assist Stand to Sit Details: cues for hand placement and controlled descent while holding heart pillow Stand Pivot Transfer Details (indicate cue type and reason): sat with minA for sequencing cues while pt held cardiac pillow to encourage forward flexion and use of LE to lower to chair; minA to assist with control of descent Ambulation/Gait Ambulation/Gait Assistance Details (indicate cue type and reason): amb. approx 14 ft while hugging cardiac pillow with mingaurdA to steady self; slow gaurded and flexed gait (limited again by heart rate as it rose to 141) Ambulation Distance (Feet): 14 Feet Assistive device: None Gait Pattern: Trunk flexed;Decreased stride length    End of Session PT - End of Session Equipment Utilized During Treatment: Gait belt Activity Tolerance: Treatment limited secondary to medical complications (Comment) (HR 137 just standing up and 140s with amb. felt woozy ) Patient left: in chair Nurse Communication: Mobility status for transfers;Mobility status for ambulation General Behavior During Session: [facility] for tasks performed Cognition: [facility] for tasks performed  [facility] HELEN 06/25/2011, 12:51 PM

## 2011-06-25 NOTE — Progress Notes (Signed)
Occupational Therapy Treatment Patient Details Name: [name] MRN: [id number] DOB: [birth year] Today's Date: 06/25/2011  OT Assessment/Plan OT Assessment/Plan Comments on Treatment Session: pts. family with a lot of questions during session regarding pt. progress, abilities. answered all questions and reviewed that pt. was doing well and progressing with therapy. family does not appear to understand how medical issues/complications have effected pt. but that she is really doing well OT Frequency: Min 2X/week Recommendations for Other Services: Rehab consult Follow Up Recommendations: Home health OT Equipment Recommended: None recommended by OT OT Goals Acute Rehab OT Goals Time For Goal Achievement: 2 weeks ADL Goals ADL Goal: Toilet Transfer - Progress: Progressing toward goals ADL Goal: Toileting - Hygiene - Progress: Progressing toward goals Arm Goals Additional Arm Goal #1: pt. able to return demonstration of RUE AA/ROM exercises with verbal inst. and demonstration, family present and also reviewed tech. Arm Goal: Additional Goal #1 - Progress: Progressing toward goals  OT Treatment Precautions/Restrictions  Precautions Precautions: Fall;Sternal Restrictions Weight Bearing Restrictions: Yes RUE Weight Bearing: Non weight bearing   ADL ADL Grooming: Not assessed Upper Body Bathing: Not assessed Lower Body Bathing: Not assessed Upper Body Dressing: Not assessed Lower Body Dressing: Not assessed Toilet Transfer: Simulated;Minimal assistance Toilet Transfer Details (indicate cue type and reason): pt. able to sit/stand from elevated bed and amb. approx. 41ft. with min a to recliner with max cues to maintain sternal precautions Toilet Transfer Method: Other (comment) (simulated from bed to recliner) Ambulation Related to ADLs: hha to amb. approx. 65ft. from bed to recliner. max cues to maintain sternal precautions with functional transfers ADL Comments: sternal precautions  make it difficult for pt. to complete sit/stand as it relates to adls Mobility  Bed Mobility Bed Mobility: No Transfers Transfers: Yes Sit to Stand: 3: Mod assist Sit to Stand Details (indicate cue type and reason): struggles with maintaining sternal precautions  Stand to Sit: 4: Min assist Stand to Sit Details: cues for hand placement and controlled descent while holding heart pillow Exercises Shoulder Exercises Shoulder Flexion: AAROM;10 reps;Seated Shoulder Extension: AAROM;10 reps;Seated Shoulder ABduction: AAROM;10 reps;Seated Elbow Flexion: AAROM;10 reps;Seated Elbow Extension: AAROM;10 reps;Seated Wrist Flexion: AAROM;10 reps;Seated Wrist Extension: AAROM;10 reps;Seated  End of Session OT - End of Session Activity Tolerance: Patient tolerated treatment well Patient left: in chair;with call bell in reach;with family/visitor present General Behavior During Session: [facility] for tasks performed Cognition: [facility] for tasks performed  Robet Leu COTA/L 06/25/2011, 12:27 PM

## 2011-06-25 NOTE — Progress Notes (Signed)
UR Completed.  [name] Jane [phone] 06/25/2011  

## 2011-06-25 NOTE — Progress Notes (Signed)
Occupational Therapy Treatment Patient Details Name: [name] MRN: [id number] DOB: Mar 15, 1941 Today's Date: 06/25/2011  OT Assessment/Plan OT Assessment/Plan Comments on Treatment Session: pts. family with a lot of questions during session regarding pt. progress, abilities. answered all questions and reviewed that pt. was doing well and progressing with therapy. family does not appear to understand how medical issues/complications have effected pt. but that she is really doing well OT Frequency: Min 2X/week Recommendations for Other Services: Rehab consult Follow Up Recommendations: Home health OT Equipment Recommended: None recommended by PT OT Goals Acute Rehab OT Goals Time For Goal Achievement: 2 weeks ADL Goals ADL Goal: Toilet Transfer - Progress: Progressing toward goals ADL Goal: Toileting - Hygiene - Progress: Progressing toward goals Arm Goals Additional Arm Goal #1: pt. able to return demonstration of RUE AA/ROM exercises with verbal inst. and demonstration, family present and also reviewed tech. Arm Goal: Additional Goal #1 - Progress: Progressing toward goals  OT Treatment Precautions/Restrictions  Precautions Precautions: Fall;Sternal Restrictions Weight Bearing Restrictions: Yes RUE Weight Bearing: Non weight bearing   ADL ADL Grooming: Not assessed Upper Body Bathing: Not assessed Lower Body Bathing: Not assessed Upper Body Dressing: Not assessed Lower Body Dressing: Not assessed Toilet Transfer: Simulated;Minimal assistance Toilet Transfer Details (indicate cue type and reason): pt. able to sit/stand from elevated bed and amb. approx. 65ft. with min a to recliner with max cues to maintain sternal precautions Toilet Transfer Method: Other (comment) (simulated from bed to recliner) Tub/Shower Transfer: Not assessed (noted HR ^ to 140's w/ PT this am; therefore did not attempt) Ambulation Related to ADLs: hha to amb. approx. 1ft. from bed to recliner. max  cues to maintain sternal precautions with functional transfers ADL Comments: Patient unable to recall any energy conservation techniques therefore, re-educated patient and husband on energy conservation and work simplification techniques for home. Answered all questions regarding this, pt and husband verbalized understanding and importance of ECWS.   Exercises General Exercises - Upper Extremity: (Encouraged stretching to tolerance with 30sec holds today) Shoulder Flexion: AAROM;10 reps;Seated  Shoulder Horizontal ABduction: AAROM;Right;10 reps;Seated Shoulder Horizontal ADduction: AAROM;Right;10 reps;Seated Elbow Flexion: AAROM;Both;Seated;10 reps Elbow Extension: AAROM;Right;Seated;10 reps Wrist Flexion: AROM;Right;10 reps;Seated Wrist Extension: AROM;Right;10 reps;Seated Hand Exercises Forearm Supination: AAROM;Right;Seated;10 reps Forearm Pronation: AAROM;10 reps;Right;Seated  End of Session OT - End of Session Activity Tolerance: Patient tolerated treatment well Patient left: in chair;with call bell in reach;with family/visitor present General Behavior During Session: [facility] for tasks performed Cognition: [facility] for tasks performed  [name]  06/25/2011, 2:01 PM

## 2011-06-25 NOTE — Progress Notes (Signed)
Nutrition Follow-up  Diet Order:  Carb mod. PO intake improving, last two meals 100%.  Meds: Scheduled Meds:   . aspirin EC  325 mg Oral Daily   Or  . aspirin  324 mg Per Tube Daily  . docusate sodium  200 mg Oral Daily  . enoxaparin  40 mg Subcutaneous QHS  . feeding supplement  237 mL Oral TID WC  . insulin aspart  0-24 Units Subcutaneous TID AC & HS  . metFORMIN  850 mg Oral BID WC  . metoCLOPramide  10 mg Oral TID AC & HS  . metoprolol tartrate  25 mg Oral QID  . pantoprazole sodium  40 mg Oral Q1200  . povidone-iodine  1 application Topical BID  . predniSONE  5 mg Oral Q breakfast  . simvastatin  20 mg Oral q1800  . sodium chloride  3 mL Intravenous Q12H  . DISCONTD: furosemide  40 mg Oral Daily  . DISCONTD: metoprolol tartrate  25 mg Oral TID  . DISCONTD: potassium chloride  20 mEq Oral Daily   Continuous Infusions:  PRN Meds:.sodium chloride, acetaminophen, ondansetron (ZOFRAN) IV, sodium chloride, traMADol  Labs:  CMP     Component Value Date/Time   NA 138 06/25/2011 0610   K 3.8 06/25/2011 0610   CL 100 06/25/2011 0610   CO2 25 06/25/2011 0610   GLUCOSE 127* 06/25/2011 0610   BUN 21 06/25/2011 0610   CREATININE 1.24* 06/25/2011 0610   CALCIUM 9.4 06/25/2011 0610   PROT 6.0 06/14/2011 0411   ALBUMIN 2.5* 06/14/2011 0411   AST 13 06/14/2011 0411   ALT 13 06/14/2011 0411   ALKPHOS 104 06/14/2011 0411   BILITOT 0.7 06/14/2011 0411   GFRNONAA 43* 06/25/2011 0610   GFRAA 50* 06/25/2011 0610     Intake/Output Summary (Last 24 hours) at 06/25/11 1625 Last data filed at 06/25/11 1300  Gross per 24 hour  Intake    720 ml  Output   1152 ml  Net   -432 ml    Weight Status:  196 lbs, weight trending down since 1/11 when weight was 229 lbs, admission weight was 200 lbs  Re-estimated needs:  2000-2200 kcal, 95-105 gm protein  Nutrition Dx:  Inadequate oral intake, improving  Goal:  PO intake to meet 90-100% of estimated nutrition needs, likely met at last 2  meals  Intervention:  Continue Ensure clinical strength TID  Monitor:  PO intake, weight, labs, i/o's   Rudean Haskell Pager #:  [phone]

## 2011-06-25 NOTE — Progress Notes (Signed)
   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   Anticipated DC Date:  06/26/2011   Anticipated DC Plan:  HOME W HOME HEALTH SERVICES      DC Planning Services  CM consult      [facility] Choice  HOME HEALTH   Choice offered to / List presented to:  C-1 Patient        HH arranged  HH-1 RN  HH-2 PT  HH-3 OT      [facility] agency  [facility]   Status of service:  In process, will continue to follow Medicare Important Message given?   (If response is "NO", the following Medicare IM given date fields will be blank) Date Medicare IM given:   Date Additional Medicare IM given:    Discharge Disposition:  HOME W HOME HEALTH SERVICES  Per UR Regulation:  Reviewed for med. necessity/level of care/duration of stay  Comments:  06/25/11 Erendida Wrenn,RN,BSN 1600 MET WITH PT AND FAMILY TO FINALIZE DC PLANS.  PT TO DC HOME WITH FAMILY AND HH CARE, LIKELY TOMORROW. HAS RW AND 3IN 1 AT HOME IF NEEDED.  REFERRAL TO [facility] FOR HOME HEALTH FOLLOW UP.  START OF CARE 24-48H POST DC DATE. Phone #[phone]   06-25-11 10:50am Avie Arenas, RNBSN - [phone] UR completed.  06-21-11 8am Avie Arenas, RNBSN [phone] UR completed. Moved to progressive, tele unit on 06-20-11  06-18-11 1:45am Avie Arenas, RNBSN - [phone] UR Completed.  06-15-11 11:40am Avie Arenas, RNBSN [phone] UR completed.  1/3 phy ther rec cir consult, will watch pt progress, debbie dowell rn,bsn 06-04-11 9:45am Avie Arenas, RNBSN [phone] UR completed.

## 2011-06-25 NOTE — Progress Notes (Signed)
CARDIAC REHAB PHASE I   PRE:  Rate/Rhythm: 102ST  BP:  Supine:   Sitting: 103/66  Standing:    SaO2: 94%RA  MODE:  Ambulation: 150 ft   POST:  Rate/Rhythem: 135ST  BP:  Supine:   Sitting: 129/78  Standing:    SaO2: 96%RA 1340-1405 Pt walked 143ft on RA with gait belt and asst x 2. Took several standing rest breaks. Winded. Leans forward. No c/o dizziness. To bathroom and then bed. Said she felt weaker today. Tired.  Duanne Limerick

## 2011-06-26 LAB — GLUCOSE, CAPILLARY: Glucose-Capillary: 144 mg/dL — ABNORMAL HIGH (ref 70–99)

## 2011-06-26 NOTE — Progress Notes (Signed)
Patient amb. In hall with walker and myself 150 feet tol. fair. A little doe . Stopped x 2 h.r. Got up to 120 s.t.

## 2011-06-26 NOTE — Progress Notes (Signed)
[id number] Cardiac Rehab Completed discharge education with pt and husband. Pt agrees to Outpt. CRP in [place], will send referral.

## 2011-06-26 NOTE — Progress Notes (Signed)
Physical Therapy Treatment Patient Details Name: [name] MRN: [id number] DOB: 02/20/1941 Today's Date: 06/26/2011  PT Assessment/Plan  PT - Assessment/Plan Comments on Treatment Session: The focus of this session was to provide education for pt and her husband prior to her d/c home today. I observed the pt and her husband perform all bed mobillity with supervision from me and minA during sit->sidelying->supine. Husband and wife report they feel safe going home and decline any further questions. I spend extra time with them this morning educating her on self monitoring for cardiac symptoms and percieved exertion as well as need for rest breaks. HHPT will f/u with pt once home. Because of her elevated heart rate (140) with just walking this monring we did not perform stairs. We discussed stairs at length as they have 3 to enter their home. They are aware of two options (w/c with 2 person assist or pt going up stairs with minA and taking longer rest breaks at each step while monitoring her exertion). MD aware of pt's elevated heart rate during ambulation.  PT Plan: Discharge plan remains appropriate PT Goals  Acute Rehab PT Goals PT Goal: Supine/Side to Sit - Progress: Progressing toward goal PT Goal: Sit to Supine/Side - Progress: Progressing toward goal PT Goal: Sit to Stand - Progress: Progressing toward goal PT Goal: Stand to Sit - Progress: Progressing toward goal PT Transfer Goal: Bed to Chair/Chair to Bed - Progress: Progressing toward goal PT Goal: Stand - Progress: Progressing toward goal PT Goal: Ambulate - Progress: Progressing toward goal PT Goal: Up/Down Stairs - Progress: Progressing toward goal PT Goal: Perform Home Exercise Program - Progress: Not progressing  PT Treatment Precautions/Restrictions  Precautions Precautions: Sternal;Fall Restrictions Weight Bearing Restrictions: No RUE Weight Bearing: Non weight bearing Other Position/Activity Restrictions: ROM out of  splint per orders Mobility (including Balance) Bed Mobility Bed Mobility: Yes Rolling Left: 4: Min assist Rolling Left Details (indicate cue type and reason): Observed husband and pt perform rolling left with pts husband providing minA; they both required supervision with verbal cueing for technique and for sternal precautions Left Sidelying to Sit: 5: Supervision Left Sidelying to Sit Details (indicate cue type and reason): Pt and husband able to perform left sidelying->sit with supervisional verbal cueing for technique and for sternal precautions (husband providing modA) Sitting - Scoot to Edge of Bed: 5: Supervision Sitting - Scoot to [place] of Bed Details (indicate cue type and reason): verbal and visual cues for reciprocal scooting and to avoid pushing with LUE Sit to Sidelying Left: 3: Mod assist;HOB elevated (comment degrees) (HOB elev 20 degrees at point pt reports is normal of home be) Sit to Sidelying Left Details (indicate cue type and reason): pt and husband attempted sit->sidelying left initially with poor technique despite verbal cueing for sequencing with pt falling posteriorly and poorly positioned in the bed as husband lifted legs to bed; 2nd attempt with minA from therapist pt instructed to lay down sidelying onto left shoulder prior to elevating legs to bed and then reposition so that pt not awkwardly laying diagonally in bed; better technique with minA from therapist Scooting to [facility]: 5: Supervision Scooting to [facility] Details (indicate cue type and reason): pt holding on to pillow; pt bridging with verbal cues to scoot HOB (the [facility] had to be lowered for her to scoot) Transfers Sit to Stand: 5: Supervision;Without upper extremity assist;From elevated surface;From bed;From chair/3-in-1 Sit to Stand Details (indicate cue type and reason): husband able to assist wife with min  tactile assist at buttocks with verbal cueing from therapist for technique Stand to Sit: 5: Supervision;Without  upper extremity assist;To chair/3-in-1;To bed;To elevated surface Stand to Sit Details: cueing for safe hand placement to avoid reaching back to chair Ambulation/Gait Ambulation/Gait Assistance: 5: Supervision Ambulation/Gait Assistance Details (indicate cue type and reason): observed pt amb with husband who performed HHA on left to steady pt; pt amb. approx 40 ft with L HHA, slow flexed trunk; limited by symptomatic HR and DOE (felt woozy) as HR rose from 108 sitting EOB to 130s during ambulation and then 140 prior to having pt sit and rest; after sitting for 5 minutes her heart rate came down to 121 but as soon as we stood to walk again it was back to 130s Ambulation Distance (Feet): 40 Feet Assistive device: 1 person hand held assist Gait Pattern: Trunk flexed;Shuffle Stairs Assistance: Not tested (comment) Stairs Assistance Details (indicate cue type and reason): Spent 10 minutes explaining stairs to pt and to husband and provided handout that explained technique for w/c on stairs with 2 person assist. Husband reports understanding and declines practicing with the therapist saying that he has done it before. I also provided them with the option of pt performing stairs and educated on self monitoring and taking it one step at a time allowing for longer rest breaks in between stairs so as to bring heart rate down.   Posture/Postural Control Posture/Postural Control: No significant limitations Balance Balance Assessed: No   End of Session  pt supine in her bed with call bell in reach and husband present.   [facility] HELEN 06/26/2011, 10:23 AM

## 2011-06-26 NOTE — Discharge Summary (Signed)
[address]            [place] [address]          [id number]      [name] [birth year] 71 y.o. [id number]  06/01/2011 06/26/2011  No att. providers found  Cholelithiases [574.20] (cholelithiases) SIRS (systemic inflammatory response syndrome) [995.90] EMS [address]   Chest Pain common bile duct stones c/p CAD  HPI:  The patient presented to the emergency department at the end of December with fever to 102, chills, nausea, vomiting, and upper abdominal pain. She was found to have leukocytosis and elevated liver function tests. Ultrasound showed gallstones without cholecystitis, a prominent common bile duct, and a CT scan showed inflammation around the head of the pancreas consistent with mild pancreatitis. She was apparently hyportensive at the time of presentation and required fluid resuscitation. She was diagnosed with cholelithiasis and underwent ERCP with sphincterotomy and stone extraction. She did have shortness of breath and what appeared to be pulmonary edema on chest x-ray and cardiac enzymes were positive. She ruled in for a non-ST segment elevation MI. A pharmacologic stress test was abnormal showing dilatation of the left ventricle and left ventricular dysfunction with ejection fraction of 20%. 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 There is probable apical infarct with possible area of reversibility involving the distal anterolateral wall concerning for ischemia. She subsequently underwent cardiac catheterization which confirmed dextrocardia. The left main coronary artery appeared to have no disease. It appeared to divide into an LAD and a small to moderate sized left circumflex system. The LAD was a large caliber vessel that did not have the normal distribution of the LAD and had about 90% mid vessel stenosis. Left circumflex was a small to moderate size vessel with 50-60% diffuse  proximal stenosis. The right coronary was large vessel with 80% mid vessel stenosis. There is also a moderate size vessel that arises from the right coronary cusp and supply at least a portion of the anterior wall. This had a 99% proximal stenosis. Left ventricular ejection fraction was estimated at 40-45%. She subsequently had a CT angiogram of the heart to better evaluate the morphology of the coronary arteries. At the present time she has no chest pain or shortness of breath. She is taking some clear liquids and does report occasional episodes of nausea.  Past Medical History   Diagnosis  Date   .  Arthritis    .  Neuropathy    .  Reflux    .  Hypertension    .  Elevated cholesterol    .  Situs inversus    .  Dextrocardia    Blood clot in vein left upper leg in past treated with coumadin  Past Surgical History   Procedure  Date   .  Elbow fracture surgery    .  Vaginal hysterectomy    .  Ercp  06/03/2011     Procedure: ENDOSCOPIC RETROGRADE CHOLANGIOPANCREATOGRAPHY (ERCP); Surgeon: Petra Kuba, MD; Location: [facility] OR; Service: Endoscopy; Laterality: N/A;    History reviewed. No pertinent family history.  Social History: reports that she has never smoked. She does not have any smokeless tobacco history on file. She reports that she does not drink alcohol or use illicit drugs.  Allergies:  Allergies   Allergen  Reactions   .  Penicillins  Anaphylaxis    Medications:  Prior to Admission:  Prescriptions prior to admission   Medication  Sig  Dispense  Refill   .  allopurinol (ZYLOPRIM) 100 MG tablet  Take 200 mg by mouth daily.     Marland Kitchen  aspirin 325 MG tablet  Take 325 mg by mouth daily.     Marland Kitchen  gabapentin (NEURONTIN) 300 MG capsule  Take 300 mg by mouth 2 (two) times daily.     Marland Kitchen  HYDROcodone-acetaminophen (VICODIN) 5-500 MG per tablet  Take 1 tablet by mouth every 4 (four) hours as needed. For pain.     Marland Kitchen  lisinopril-hydrochlorothiazide (PRINZIDE,ZESTORETIC) 20-25 MG per tablet  Take 1  tablet by mouth every morning.     Marland Kitchen  omeprazole (PRILOSEC) 20 MG capsule  Take 20 mg by mouth every morning.     .  predniSONE (DELTASONE) 5 MG tablet  Take 5 mg by mouth daily.     .  simvastatin (ZOCOR) 40 MG tablet  Take 40 mg by mouth at bedtime.      Scheduled:  .  albuterol  2.5 mg  Nebulization  Q6H   .  aspirin EC  81 mg  Oral  Daily   .  ciprofloxacin  400 mg  Intravenous  Q12H   .  insulin aspart  0-15 Units  Subcutaneous  Q4H   .  insulin glargine  5 Units  Subcutaneous  QHS   .  ipratropium  0.5 mg  Nebulization  Q6H   .  metoprolol tartrate  50 mg  Oral  Once   .  metoprolol tartrate  12.5 mg  Oral  BID   .  metoprolol tartrate  25 mg  Oral  Once   .  metronidazole  500 mg  Intravenous  Q6H   .  pantoprazole  40 mg  Oral  QHS   .  predniSONE  5 mg  Oral  Q breakfast   .  DISCONTD: captopril  6.25 mg  Oral  BID   .  DISCONTD: metoprolol tartrate  50 mg  Oral  Once   .  DISCONTD: predniSONE  10 mg  Oral  Q breakfast    Continuous:  .  sodium chloride  20 mL/hr at 06/11/11 0409    ZOX:[id number], albuterol, diazepam, iohexol, LORazepam, ondansetron *RADIOLOGY REPORT*  Clinical Data: Chest pain with elevated cardiac enzymes and LV  dysfunction. Situs inversus.  MYOCARDIAL IMAGING WITH SPECT (REST AND PHARMACOLOGIC-STRESS)  GATED LEFT VENTRICULAR WALL MOTION STUDY  LEFT VENTRICULAR EJECTION FRACTION  Technique: Resting myocardial SPECT imaging was initially  performed after intravenous administration of radiopharmaceutical.  Myocardial SPECT was subsequently performed after additional  radiopharmaceutical injection during pharmacologic-stress  (Lexiscan)supervised by the Cardiology staff. Quantitative gated  imaging was also performed to evaluate left ventricular wall  motion, and estimate left ventricular ejection fraction.  Radiopharmaceutical: 10 mCi Tc-37m Myoview at rest and 30 mCi  during stress.  Comparison: MRCP 06/02/2011. Abdominal CT 06/01/2011.    Findings: The source planar images demonstrate prominent  subdiaphragmatic activity and situs inversus. Post processing is  affected by the patient's altered anatomy. The computer generated  labeling appears inconsistent. SPECT images demonstrate decreased  activity at the apex. There appears to be a reversible component  involving the distal anterolateral wall.  Gated cine images were reviewed on the workstation and demonstrate  apical and septal hypokinesis. The QGS ejection fraction measured  at rest is 28% with an end diastolic  volume of 43 ml and an end-  systolic volume of 31 ml.  IMPRESSION:  1. Abnormal study demonstrating dilatation of the left ventricle  and left ventricular dysfunction. Calculated left ventricular  ejection fraction is 28%.  2. 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  There is a probable apical infarct with a possible area of  reversibility involving the distal anterolateral wall concerning  for ischemia.  Original Report Authenticated By: Gerrianne Scale, M.D.  Hemodynamic Findings:  Central aortic pressure: 103/62  Left ventricular pressure: 97/5/10  Angiographic Findings:  The patient has dextrocardia.  Left main: Long vessel that gives rise to what appears to be the LAD and a small to moderate sized Circumflex system.  Left Anterior Descending Artery: Large caliber vessel that does not have a normal distribution of the LAD. There appears to be septal perforating branches and a diagonal branch. The mid portion of the vessel has a 90% stenosis.  Circumflex Artery: Small to moderate sized vessel with proximal diffuse 50-60% stenosis.  Right Coronary Artery: Large vessel with 80% mid stenosis. There is a moderate sized vessel that arises from the right coronary cusp and appears to supply at least a portion of the anterior wall. This vessel has a 99% proximal stenosis.  Left Ventricular Angiogram: Anteroapical hypokinesis. LVEF 40-45%.   Impression:  1. Dextrocardia  2. NSTEMI  3. 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  4. Mild LV systolic dysfunction  Ct Heart Morp W/cta Cor W/score W/ca W/cm &/or Wo/cm  06/12/2011 *RADIOLOGY REPORT* INDICATION: Recent cardiac catheterization. Dextrocardia with anomalous cardiac anatomy. Now for further characterization. CT ANGIOGRAPHY OF THE HEART, CORONARY ARTERY, STRUCTURE, AND MORPHOLOGY CONTRAST: 80mL OMNIPAQUE IOHEXOL 350 MG/ML IV SOLN COMPARISON: CT of the abdomen and pelvis 06/01/2011 TECHNIQUE: CT angiography of the coronary vessels was performed on a 256 channel system using prospective ECG gating. A scout and noncontrast exam (for calcium scoring) were performed. Circulation time was measured using a test bolus. Coronary CTA was performed with sub mm slice collimation during portions of the cardiac cycle after prior injection of iodinated contrast. Imaging post processing was performed on an independent workstation creating multiplanar and 3-D images, and quantitative analysis of the heart and coronary arteries. Note that this exam targets the heart and the chest was not imaged in its entirety. PREMEDICATION: Lopressor 75 mg, P.O. FINDINGS: Technical quality: Adequate for the evaluation of anatomy. Not adequate for evaluation for intrinsic disease due to irregular, elevated heart rate. Heart rate: 75 CORONARY ARTERIES: There is situs inversus. The SVC and the IVC pass into the anatomic right atrium as expected. There is atrioventricular concordance on both the right and the left side of the heart. There is ventriculo-arterial concordance on both size of the heart as well. 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 At the aortic root, there is an anterior cusp which normally would be the right coronary cusp. This will be referred report as the anterior cusp. There is a posterior cusp which  normally would be considered the left cusp. This will be referred to as the posterior cusp. There is a left cusp in the atrioventricular groove which will be considered the noncoronary cusp. There is a single vessel arising from the anterior cusp which would normally be referred to as the right coronary artery. This gives rise to a proximal branch which passes anteriorly in a pre-pulmonic  course and continues in the interventricular groove in the typical distribution of the distal left anterior descending artery. The right coronary artery continues and gives off an SA nodal branch and septal perforator branches. The vessel continues to give rise to the acute marginal, posterolateral left ventricular branch, and posterior descending artery (right dominant). Arising from the posterior sinus is a branch which would normally be considered the left main coronary artery. This gives rise to a large branch which appears to continue as a large first diagonal branch as well as a smaller left circumflex branch. Septal perforator branches also arise from the proximal portion of the large first diagonal branch. CORONARY CALCIUM: Total Agatston Score: 82.95 MESA database percentile: 74% CARDIAC MEASUREMENTS: Interventricular septum (6 - 12 mm): 11 mm LV posterior wall (6 - 12 mm): 10 mm LV diameter in diastole (35 - 52 mm): 37 mm AORTA AND PULMONARY MEASUREMENTS: Aortic root (21 - 40 mm): 22 mm at the annulus 33 mm at the sinuses of Valsalva 25 mm at the sinotubular junction Ascending aorta ( < 40 mm): 31 mm Descending aorta ( < 40 mm): 20 mm Main pulmonary artery: ( < 30 mm): 23 mm EXTRACARDIAC FINDINGS: Situs inversus also noted in the abdomen with a left sided liver. There are small bilateral pleural effusions, left slightly greater than right. Atelectasis noted in both lower lobes. IMPRESSION: 1. Situs inversus totalis. What would typically be called to the right coronary artery arises from the anterior sinus of Valsalva.  There is a proximal branch coursing in a pre pulmonic position which continues in the typical distal LAD distribution. Wall would typically be considered the left main coronary artery arises from the posterior sinus of Valsalva and gives rise to a large first diagonal branch and diminutive circumflex. The patient's total coronary artery calcium score is 82.95, which is 74 percentile for patient's matched age and gender. 2. Small bilateral pleural effusions with bibasilar atelectasis. 3. Right coronary artery dominance. Report was called to CDU mid level at [phone] at on . Original Report Authenticated By: Cyndie Chime, M.D.   Review of Systems at time of consultation Constitutional: Positive for malaise/fatigue. Negative for fever, chills and diaphoresis.  Eyes: Negative.  Respiratory: Positive for shortness of breath. Negative for cough and sputum production.  Cardiovascular: Negative for chest pain, palpitations, orthopnea, leg swelling and PND.  Gastrointestinal: Positive for nausea and abdominal pain. Negative for diarrhea, blood in stool and melena.  Genitourinary: Negative for dysuria, frequency and hematuria.  Musculoskeletal: Positive for joint pain. Negative for myalgias.  Skin: Negative for itching and rash.  Neurological: Positive for weakness. Negative for dizziness, sensory change, focal weakness and headaches.  Endo/Heme/Allergies: Does not bruise/bleed easily.  Psychiatric/Behavioral: Negative.   Blood pressure 100/54, pulse 69, temperature 97.4 F (36.3 C), temperature source Oral, resp. rate 18, height 5\' 6"  (1.676 m), weight 102 kg (224 lb 13.9 oz), SpO2 96.00%.  Physical Exam at time of consultation Constitutional: She is oriented to person, place, and time. She appears well-developed.  Chronically ill-appearing  HENT:  Head: Normocephalic and atraumatic.  Mouth/Throat: Oropharynx is clear and moist.  Eyes: EOM are normal. Pupils are equal, round, and reactive to light.    Neck: No thyromegaly present.  Cardiovascular: Normal rate, regular rhythm, normal heart sounds and intact distal pulses. Exam reveals no gallop and no friction rub.  No murmur heard.  Respiratory: No respiratory distress. She has rales.  In bases  GI: Bowel sounds are normal. She exhibits  no distension and no mass. There is no tenderness. There is no rebound and no guarding.  Musculoskeletal: She exhibits no edema.  Lymphadenopathy:  She has no cervical adenopathy.  Neurological: She is alert and oriented to person, place, and time. She has normal strength. No cranial nerve deficit.  Skin: Skin is warm and dry.  Psychiatric: She has a normal mood and affect.  Extremities: Splint on right arm. No peripheral edema. Pedal pulses palpable.    Due to these findings surgical consultation was obtained with Dr Evelene Croon evaluated the patient studies and agreed with recommendations to proceed with surgical revascularization. It was felt by Dr. Dwain Sarna the cholecystectomy could be done in the future and cardiac risk outweighs any further intervention at this time on the gallbladder. The patient was otherwise deemed medically stable for proceeding with surgery and on 06/14/2011 she underwent the following procedure:   OPERATIVE REPORT  PREOPERATIVE DIAGNOSIS: Severe multivessel coronary artery disease with  unstable angina and non ST-segment elevation myocardial infarction.  POSTOPERATIVE DIAGNOSIS: Severe multivessel coronary artery disease  with unstable angina and non ST-segment elevation myocardial infarction.  OPERATIVE PROCEDURE: Median sternotomy, extracorporeal circulation,  coronary artery bypass graft surgery x3 using a right internal mammary  artery graft to the left anterior descending coronary artery, with a  saphenous vein graft to the ramus coronary artery, and a saphenous vein  graft to the right coronary artery. Endoscopic vein harvesting from the  right leg. Insertion of  left femoral arterial line for blood pressure  monitoring.  SURGEON: Evelene Croon, MD  ASSISTANT: Al Corpus SA  ANESTHESIA: General endotracheal.  She tolerated the procedure well and was taken to the surgical intensive care unit in stable condition.  Postoperative hospital course:   Overall the patient has progressed nicely. She was weaned from the ventilator without difficulty. All routine lines, monitors, drainage devices have been discontinued in the standard fashion. She has had a moderate amount of postoperative nausea which is improved with time. She has remained quite weak but is slowly tolerating more activity including ambulation. She is doing range of motion exercises on her right arm removing it from the splint to do so. She has some postoperative volume overload but is responding well to diuretics. Incisions are healing well without evidence of infection. Oxygen has been weaned and she maintains good saturations on room air. Her blood sugars have been under adequate control. Arrangements have been made for home physical therapy, occupational therapy and nursing. She is felt to be stable for discharge on today's date.    Basename 06/25/11 0610  NA 138  K 3.8  CL 100  CO2 25  GLUCOSE 127*  BUN 21  CALCIUM 9.4    Basename 06/25/11 0610  WBC 9.8  HGB 11.8*  HCT 37.0  PLT 306   No results found for this basename: INR:2 in the last 72 hours   Discharge Instructions:  The patient is discharged to home with extensive instructions on wound care and progressive ambulation.  They are instructed not to drive or perform any heavy lifting until returning to see the physician in his office.  Discharge Diagnosis:  Cholelithiases [574.20] (cholelithiases) SIRS (systemic inflammatory response syndrome) [995.90] EMS [address]   Chest Pain common bile duct stones c/p CAD  Secondary Diagnosis: Patient Active Problem List  Diagnoses  . Troponin level elevated  . Acute myocardial  infarction, subendocardial infarction, subsequent episode of care  . Acute systolic congestive heart failure  . Acute renal  failure  . Hypokalemia  . Hypomagnesemia  . Dextrocardia   Past Medical History  Diagnosis Date  . Arthritis   . Neuropathy   . Reflux   . Hypertension   . Elevated cholesterol   . Situs inversus   . Dextrocardia   . Fracture 05/24/2011    right; "did not have surgery"  . DVT of leg (deep venous thrombosis) ~2006    left  . Pneumonia 06/13/11    "couple times; long time ago"  . Myocardial infarction 06/01/11  . Chest pain   . Chronic bronchitis   . Blood transfusion   . Anemia   . H/O hiatal hernia        Dor[place], Hand  Home Medication Instructions HQI:[id number]   Printed on:06/26/11 1102  Medication Information                    aspirin 325 MG tablet Take 325 mg by mouth daily.             gabapentin (NEURONTIN) 300 MG capsule Take 300 mg by mouth 2 (two) times daily.             allopurinol (ZYLOPRIM) 100 MG tablet Take 200 mg by mouth daily.             predniSONE (DELTASONE) 5 MG tablet Take 5 mg by mouth daily.             omeprazole (PRILOSEC) 20 MG capsule Take 20 mg by mouth every morning.             metFORMIN (GLUCOPHAGE) 850 MG tablet Take 1 tablet (850 mg total) by mouth 2 (two) times daily with a meal.           metoprolol tartrate (LOPRESSOR) 25 MG tablet Take 2 tablets (50 mg total) by mouth 2 (two) times daily.           simvastatin (ZOCOR) 40 MG tablet Take 0.5 tablets (20 mg total) by mouth at bedtime.           traMADol (ULTRAM) 50 MG tablet Take 1-2 tablets (50-100 mg total) by mouth every 6 (six) hours as needed.             Disposition: Discharged home  Patient's condition is Good  Gershon Crane, PA-C 06/26/2011  11:02 AM

## 2011-06-26 NOTE — Progress Notes (Addendum)
[address]            [place] [address]          [phone]     12 Days Post-Op  Procedure(s) (LRB): CORONARY ARTERY BYPASS GRAFTING (CABG) (N/A) Subjective: Feels ok, anxious to go home  Objective  Telemetry SR/ST  Temp:  [97.6 F (36.4 C)-98 F (36.7 C)] 98 F (36.7 C) (01/22 0524) Pulse Rate:  [93-137] 97  (01/22 0524) Resp:  [18] 18  (01/22 0524) BP: (92-121)/(64-72) 121/72 mmHg (01/22 0524) SpO2:  [92 %-94 %] 92 % (01/22 0524) Weight:  [195 lb 1.7 oz (88.5 kg)] 195 lb 1.7 oz (88.5 kg) (01/22 0524)   Intake/Output Summary (Last 24 hours) at 06/26/11 0817 Last data filed at 06/25/11 2100  Gross per 24 hour  Intake    960 ml  Output    502 ml  Net    458 ml       General appearance: alert and no distress Heart: regular rate and rhythm and S1, S2 normal Lungs: mildly diminished in bases Abdomen: benign exam Extremities: minor edema Wound: incisions healing well without signs of infection  Lab Results:  Basename 06/25/11 0610  NA 138  K 3.8  CL 100  CO2 25  GLUCOSE 127*  BUN 21  CREATININE 1.24*  CALCIUM 9.4  MG --  PHOS --   No results found for this basename: AST:2,ALT:2,ALKPHOS:2,BILITOT:2,PROT:2,ALBUMIN:2 in the last 72 hours No results found for this basename: LIPASE:2,AMYLASE:2 in the last 72 hours  Basename 06/25/11 0610  WBC 9.8  NEUTROABS --  HGB 11.8*  HCT 37.0  MCV 93.2  PLT 306   No results found for this basename: CKTOTAL:4,CKMB:4,TROPONINI:4 in the last 72 hours No components found with this basename: POCBNP:3 No results found for this basename: DDIMER in the last 72 hours No results found for this basename: HGBA1C in the last 72 hours No results found for this basename: CHOL,HDL,LDLCALC,TRIG,CHOLHDL in the last 72 hours No results found for this basename: TSH,T4TOTAL,FREET3,T3FREE,THYROIDAB in the last 72 hours No results found for this basename:  VITAMINB12,FOLATE,FERRITIN,TIBC,IRON,RETICCTPCT in the last 72 hours  Medications: Scheduled    . aspirin EC  325 mg Oral Daily   Or  . aspirin  324 mg Per Tube Daily  . docusate sodium  200 mg Oral Daily  . enoxaparin  40 mg Subcutaneous QHS  . feeding supplement  237 mL Oral TID WC  . insulin aspart  0-24 Units Subcutaneous TID AC & HS  . metFORMIN  850 mg Oral BID WC  . metoCLOPramide  10 mg Oral TID AC & HS  . metoprolol tartrate  25 mg Oral QID  . pantoprazole sodium  40 mg Oral Q1200  . povidone-iodine  1 application Topical BID  . predniSONE  5 mg Oral Q breakfast  . simvastatin  20 mg Oral q1800  . sodium chloride  3 mL Intravenous Q12H  . DISCONTD: furosemide  40 mg Oral Daily  . DISCONTD: metoprolol tartrate  25 mg Oral TID  . DISCONTD: potassium chloride  20 mEq Oral Daily     Radiology/Studies:  No results found.  INR: Will add last result for INR, ABG once components are confirmed Will add last 4 CBG results once components are confirmed  Assessment/Plan: S/P Procedure(s) (LRB): CORONARY ARTERY BYPASS GRAFTING (CABG) (N/A)  2. Steady progress, stable for d/c  with home PT arranged for rehab/deconditioning   LOS: 25 days    [name],Alyssa E 1/22/20138:17 AM     Chart reviewed, patient examined, agree with above. Home today

## 2011-07-10 ENCOUNTER — Ambulatory Visit (INDEPENDENT_AMBULATORY_CARE_PROVIDER_SITE_OTHER): Payer: Medicare Other | Admitting: Physician Assistant

## 2011-07-10 ENCOUNTER — Encounter: Payer: Self-pay | Admitting: Physician Assistant

## 2011-07-10 ENCOUNTER — Ambulatory Visit (INDEPENDENT_AMBULATORY_CARE_PROVIDER_SITE_OTHER)
Admission: RE | Admit: 2011-07-10 | Discharge: 2011-07-10 | Disposition: A | Discharge status: Home / Self Care | Payer: Medicare Other | Source: Ambulatory Visit | Attending: Physician Assistant | Admitting: Physician Assistant

## 2011-07-10 VITALS — BP 122/80 | HR 100 | Ht 66.0 in | Wt 196.0 lb

## 2011-07-10 DIAGNOSIS — R05 Cough: Secondary | ICD-10-CM

## 2011-07-10 DIAGNOSIS — E119 Type 2 diabetes mellitus without complications: Secondary | ICD-10-CM | POA: Insufficient documentation

## 2011-07-10 DIAGNOSIS — I6529 Occlusion and stenosis of unspecified carotid artery: Secondary | ICD-10-CM

## 2011-07-10 DIAGNOSIS — E785 Hyperlipidemia, unspecified: Secondary | ICD-10-CM | POA: Insufficient documentation

## 2011-07-10 DIAGNOSIS — I2510 Atherosclerotic heart disease of native coronary artery without angina pectoris: Secondary | ICD-10-CM

## 2011-07-10 MED ORDER — METOPROLOL TARTRATE 50 MG PO TABS: 75.0000 mg | ORAL_TABLET | Freq: Two times a day (BID) | ORAL | Status: DC

## 2011-07-10 NOTE — Assessment & Plan Note (Signed)
New dx.  Continue metformin and follow up with PCP.

## 2011-07-10 NOTE — Assessment & Plan Note (Addendum)
Volume appears stable.  She has some crackles in her bases on lung exam.  I suspect that this may be from upper airway noise.  I will go ahead and send her for a chest x-ray today.  She will likely need a followup echocardiogram in 3 months post bypass to reassess her LV function.

## 2011-07-10 NOTE — Progress Notes (Signed)
[address] [place], [place]  [address] Phone: [phone] Fax:  [phone]  Date:  07/10/2011   Name:  [name]       DOB:  1940/07/04 MRN:  [id number]  PCP:  Dr. Nicholos Johns Primary Cardiologist:  Dr. Sherryl Manges    History of Present Illness: [name] is a 71 y.o. female who presents for post hospital follow up.  She was admitted 12/28-1/22.   Initially admitted with symptoms of pancreatitis.  Diagnosed with cholelithiasis and underwent ERCP with sphincterotomy and stone extraction.  Following this, she developed pulmonary edema and ruled in for an NSTEMI.  Stress testing was undertaken and demonstrated an EF 20% and possible situs inversus which limited interpretation of the study.  LHC was performed 06/01/11.  This confirmed dextrocardia.  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  EF was 40-45% by cardiac catheterization.  Echocardiogram 06/05/11: EF 40-45%, anteroseptal hypokinesis, apical hypokinesis, mild LAE.  CT scan was recommended and performed 1/8.  This demonstrated situs inversus totalis.  What would typically be called RCA arose from anterior sinus of Valsalva.  What would typically be called the left main arises from the posterior sinus of Valsalva and gives rise to a large first diagonal branch and diminutive circumflex.  She was then seen by Dr. Laneta Simmers of [facility].  CABG was recommended.  Gen. Surgery felt that cholecystectomy could be done in the future as cardiac risk outweighed further intervention for her gallbladder.  CABG was performed 06/14/11: RIMA-LAD, SVG-ramus, SVG-RCA.  She remained in sinus rhythm postoperatively.  Labs: Potassium 3.8, creatinine 1.24, TC 124, TG 91, HDL 36, LDL 70, hemoglobin 11.8, hemoglobin A1c 7.1.  Pre-CABG Dopplers 06/12/11: LICA 60-79%.  She still feels weak.  She is walking several times a day her house with  physical therapy.  She does 250 feet at a time.  She denies significant shortness of breath with this.  She does still have some chest soreness.  She has to sleep sitting up due to the soreness in her chest.  She denies PND.  She denies lower extremity edema.  She denies syncope.  She seems to be getting stronger.  Past Medical History  Diagnosis Date  . Arthritis   . Neuropathy   . Reflux   . Hypertension   . Elevated cholesterol   . Situs inversus   . Dextrocardia   . Fracture 05/24/2011    right; "did not have surgery"  . DVT of leg (deep venous thrombosis) ~2006    left  . Pneumonia 06/13/11    "couple times; long time ago"  . Myocardial infarction 06/01/11  . Chest pain   . Chronic bronchitis   . Blood transfusion   . Anemia   . H/O hiatal hernia     Current Outpatient Prescriptions  Medication Sig Dispense Refill  . allopurinol (ZYLOPRIM) 100 MG tablet Take 200 mg by mouth daily.        Marland Kitchen aspirin 325 MG tablet Take 325 mg by mouth daily.        Marland Kitchen gabapentin (NEURONTIN) 300 MG capsule Take 300 mg by mouth 2 (two) times daily.        . metFORMIN (GLUCOPHAGE) 850 MG tablet Take 1 tablet (850 mg total) by mouth 2 (two) times daily with a meal.  60 tablet  1  . metoprolol tartrate (LOPRESSOR) 25 MG  tablet Take 2 tablets (50 mg total) by mouth 2 (two) times daily.  60 tablet  1  . omeprazole (PRILOSEC) 20 MG capsule Take 20 mg by mouth every morning.        . predniSONE (DELTASONE) 5 MG tablet Take 5 mg by mouth daily.        . simvastatin (ZOCOR) 40 MG tablet Take 0.5 tablets (20 mg total) by mouth at bedtime.  30 tablet  1  . traMADol (ULTRAM) 50 MG tablet Take 50 mg by mouth every 6 (six) hours as needed.         Allergies: Allergies  Allergen Reactions  . Penicillins Anaphylaxis    History  Substance Use Topics  . Smoking status: Never Smoker   . Smokeless tobacco: Never Used  . Alcohol Use: No     ROS:  Please see the history of present illness.   She does have a  cough.  This is productive of clear sputum.  She denies fevers or chills.  She has difficulty with sinus drainage but has not taken Claritin because she was not sure if this was acceptable.  All other systems reviewed and negative.   PHYSICAL EXAM: VS:  BP 122/80  Pulse 100  Ht 5\' 6"  (1.676 m)  Wt 196 lb (88.905 kg)  BMI 31.64 kg/m2 Well nourished, well developed, in no acute distress HEENT: normal Neck: no JVD At 90 degrees Cardiac:  normal S1, S2; RRR; no murmur Lungs:  Crackles at the bases noted bilaterally, no wheezing, rhonchi Abd: soft, nontender, no hepatomegaly Ext: no edema; Right groin without hematoma or bruit Skin: warm and dry Neuro:  CNs 2-12 intact, no focal abnormalities noted  EKG:  Sinus rhythm, heart rate 100, normal axis, PACs, nonspecific ST-T wave changes  ASSESSMENT AND PLAN:

## 2011-07-10 NOTE — Assessment & Plan Note (Signed)
This is mild and appears to be related to sinus drainage.  I will send her for a chest x-ray as noted.  She may take Claritin 10 mg daily as needed.

## 2011-07-10 NOTE — Assessment & Plan Note (Addendum)
Doing well post bypass.  She she remains weak but is getting stronger.  She is working with home health physical therapy.  Continue aspirin and statin.  I will bring her back in followup with Dr. Verne Carrow in 4-6 weeks.  I would like to see her heart rate better controlled.  Increase metoprolol to 75 mg twice a day.

## 2011-07-10 NOTE — Assessment & Plan Note (Signed)
Full evaluation prior to bypass.

## 2011-07-10 NOTE — Assessment & Plan Note (Signed)
She will need follow up in 6-12 mos.  This can be arranged at follow up.

## 2011-07-10 NOTE — Assessment & Plan Note (Signed)
Managed by PCP

## 2011-07-10 NOTE — Patient Instructions (Signed)
Your physician recommends that you schedule a follow-up appointment in: 4-6 weeks with Dr Clifton James A chest x-ray takes a picture of the organs and structures inside the chest, including the heart, lungs, and blood vessels. This test can show several things, including, whether the heart is enlarges; whether fluid is building up in the lungs; and whether pacemaker / defibrillator leads are still in place. Your physician has recommended you make the following change in your medication: INCREASE Metoprolol to 75 mg twice daily -- if you feel bad the decrease back to 50 mg twice daily ---- you make take Claritin 10 mg daily as needed for allergies

## 2011-07-12 ENCOUNTER — Other Ambulatory Visit: Payer: Self-pay | Admitting: Surgery

## 2011-07-12 DIAGNOSIS — I2510 Atherosclerotic heart disease of native coronary artery without angina pectoris: Secondary | ICD-10-CM

## 2011-07-17 ENCOUNTER — Ambulatory Visit (INDEPENDENT_AMBULATORY_CARE_PROVIDER_SITE_OTHER): Payer: Self-pay | Admitting: Surgery

## 2011-07-17 ENCOUNTER — Ambulatory Visit
Admission: RE | Admit: 2011-07-17 | Discharge: 2011-07-17 | Disposition: A | Discharge status: Home / Self Care | Payer: Medicare Other | Source: Ambulatory Visit | Attending: Surgery | Admitting: Surgery

## 2011-07-17 ENCOUNTER — Encounter: Payer: Self-pay | Admitting: Surgery

## 2011-07-17 VITALS — BP 133/86 | HR 104 | Resp 20 | Ht 66.0 in | Wt 192.0 lb

## 2011-07-17 DIAGNOSIS — Z951 Presence of aortocoronary bypass graft: Secondary | ICD-10-CM

## 2011-07-17 DIAGNOSIS — I2510 Atherosclerotic heart disease of native coronary artery without angina pectoris: Secondary | ICD-10-CM

## 2011-07-17 NOTE — Progress Notes (Signed)
[address]            [name] [address]          [phone]      HPI:  Patient returns for routine postoperative follow-up having undergone coronary bypass graft surgery x3 on 06/14/2011. She had known dextrocardia with situs inversus. The patient's early postoperative recovery while in the hospital was notable for a slow postoperative recovery due to to preoperative debilitation and deconditioning. She was in the hospital for several weeks preoperatively after presenting with cholangitis secondary to a common bile duct stone. This was treated with ERCP and stone extraction. She developed acute pulmonary edema following this. Since hospital discharge the patient reports that she has been recovering very slowly. She is walking 3 times per day in her house. She continues to complain of feeling weak with some shortness of breath with exertion. She has had some nonproductive cough. She said that since last Saturday she has developed some pains across her lower back. She denies any fever or chills. She's been passing her urine well and having normal bowel movements. Her appetite has not been very good and she has had some nausea.   Current Outpatient Prescriptions  Medication Sig Dispense Refill  . allopurinol (ZYLOPRIM) 100 MG tablet Take 200 mg by mouth daily.        Marland Kitchen aspirin 325 MG tablet Take 325 mg by mouth daily.        Marland Kitchen gabapentin (NEURONTIN) 300 MG capsule Take 300 mg by mouth 2 (two) times daily.        . metFORMIN (GLUCOPHAGE) 850 MG tablet Take 1 tablet (850 mg total) by mouth 2 (two) times daily with a meal.  60 tablet  1  . metoprolol (LOPRESSOR) 50 MG tablet Take 50 mg by mouth 2 (two) times daily.      Marland Kitchen omeprazole (PRILOSEC) 20 MG capsule Take 20 mg by mouth every morning.        . predniSONE (DELTASONE) 5 MG tablet Take 5 mg by mouth daily.        . simvastatin (ZOCOR) 40 MG tablet Take 0.5 tablets (20 mg total) by mouth at bedtime.  30  tablet  1  . traMADol (ULTRAM) 50 MG tablet Take 50 mg by mouth every 6 (six) hours as needed.         Physical Exam: BP 133/86  Pulse 104  Resp 20  Ht 5\' 6"  (1.676 m)  Wt 192 lb (87.091 kg)  BMI 30.99 kg/m2  SpO2 90% She looks well. Cardiac exam shows regular rate and rhythm and normal heart sounds. Lung exam reveals crackles in the bases. The chest incision is healing well and sternum is stable. The vein harvest site is healing well and there is no peripheral edema.  Diagnostic Tests:  Chest x-ray today shows minimal residual right pleural effusion and basilar atelectasis. Lungs are otherwise clear.  Impression:  I think she is making a slow but progressive recovery following her surgery. I would expect it take much longer for her to recover due to her preoperative deconditioning. She still has a gallbladder full of gallstones which could be giving her some nausea and anorexia symptoms. The plan was for her to eventually have cholecystectomy after she recovered from the surgery. She has not seen a general surgeon in followup of that. I encouraged her to continue walking  as much as possible and to use her incentive spirometer. I told her she could return to driving a car when she feels comfortable with that but should refrain from lifting anything heavier than 10 pounds for a total of 3 months from the date of surgery.  Plan:  She will followup with her primary physician and cardiologist and will contact me if she develops any problems with her incisions. She will followup with general surgery concerning her gallbladder.

## 2011-08-03 DIAGNOSIS — I2699 Other pulmonary embolism without acute cor pulmonale: Secondary | ICD-10-CM

## 2011-08-03 HISTORY — DX: Other pulmonary embolism without acute cor pulmonale: I26.99

## 2011-08-07 ENCOUNTER — Ambulatory Visit (INDEPENDENT_AMBULATORY_CARE_PROVIDER_SITE_OTHER): Payer: Medicare Other | Admitting: Cardiovascular Disease

## 2011-08-07 ENCOUNTER — Encounter: Payer: Self-pay | Admitting: Cardiovascular Disease

## 2011-08-07 VITALS — BP 124/77 | HR 122 | Ht 66.0 in | Wt 192.0 lb

## 2011-08-07 DIAGNOSIS — I5022 Chronic systolic (congestive) heart failure: Secondary | ICD-10-CM

## 2011-08-07 DIAGNOSIS — I2510 Atherosclerotic heart disease of native coronary artery without angina pectoris: Secondary | ICD-10-CM

## 2011-08-07 DIAGNOSIS — R0609 Other forms of dyspnea: Secondary | ICD-10-CM

## 2011-08-07 DIAGNOSIS — R0600 Dyspnea, unspecified: Secondary | ICD-10-CM | POA: Insufficient documentation

## 2011-08-07 NOTE — Progress Notes (Signed)
History of Present Illness: [name] is a 71 y.o. female who presents for follow up. She was admitted 12/28-1/22/13. Initially admitted with symptoms of pancreatitis. Diagnosed with cholelithiasis and underwent ERCP with sphincterotomy and stone extraction. Following this, she developed pulmonary edema and ruled in for an NSTEMI. Stress testing was undertaken and demonstrated an EF 20% and possible situs inversus which limited interpretation of the study. LHC was performed 06/01/11. This confirmed dextrocardia. 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 EF was 40-45% by cardiac catheterization. Echocardiogram 06/05/11: EF 40-45%, anteroseptal hypokinesis, apical hypokinesis, mild LAE. CT scan was recommended and performed 1/8. This demonstrated situs inversus totalis. What would typically be called RCA arose from anterior sinus of Valsalva. What would typically be called the left main arises from the posterior sinus of Valsalva and gives rise to a large first diagonal branch and diminutive circumflex. She was then seen by Dr. Laneta Simmers of [facility]. CABG was recommended. Gen. Surgery felt that cholecystectomy could be done in the future as cardiac risk outweighed further intervention for her gallbladder. CABG was performed 06/14/11: RIMA-LAD, SVG-ramus, SVG-RCA. She remained in sinus rhythm postoperatively.  Pre-CABG Dopplers 06/12/11: LICA 60-79%. She saw Tereso Newcomer, PA-C 07/10/11 for hospital follow up and was slowly progressing post bypass.   She is here today for planned follow up. She has seen Dr. Nicholos Johns earlier today. She has not yet had her cholecystectomy. She continues to have nausea and lack of appetite. She also reports continue SOB. This has not changed since her surgery. She is very fatigued. NO chest pain. She was seen by her primary care physician today and started on Lasix for LE edema. No cough,  fever, chills, rigors.   Primary Care Physician: Elias Else  Past Medical History  Diagnosis Date  . Arthritis   . Neuropathy   . GERD (gastroesophageal reflux disease)   . Hypertension   . HLD (hyperlipidemia)   . Situs inversus with dextrocardia     CT 06/2011: situs inversus totalis.  What would typically be called RCA arose from anterior sinus of Valsalva.  What would typically be called the left main arises from the posterior sinus of Valsalva and gives rise to a large first diagonal branch and diminutive circumflex  . Dextrocardia   . Fracture 05/24/2011    right; "did not have surgery"  . DVT of leg (deep venous thrombosis) ~2006    left  . Pneumonia 06/13/11    "couple times; long time ago"  . CAD (coronary artery disease)     NSTEMI in setting of gallstone pancreatitis 05/2011:  LHC with 3v CAD; CABG was performed 06/14/11: RIMA-LAD, SVG-ramus, SVG-RCA  . Ischemic cardiomyopathy     Echocardiogram 06/05/11: EF 40-45%, anteroseptal hypokinesis, apical hypokinesis, mild LAE  . Chronic bronchitis   . Anemia   . H/O hiatal hernia   . Chronic systolic heart failure   . Carotid stenosis     Dopplers 06/12/11: LICA 60-79%.    Past Surgical History  Procedure Date  . Ercp 06/03/2011    Procedure: ENDOSCOPIC RETROGRADE CHOLANGIOPANCREATOGRAPHY (ERCP);  Surgeon: Petra Kuba, MD;  Location: [facility] OR;  Service: Endoscopy;  Laterality: N/A;  . Vaginal hysterectomy 1970's  . Cardiac catheterization 06/11/11  . Coronary artery bypass graft 06/14/2011    Procedure: CORONARY ARTERY BYPASS GRAFTING (CABG);  Surgeon: Alleen Borne, MD;  Location: [facility] OR;  Service:  Open Heart Surgery;  Laterality: N/A;    Current Outpatient Prescriptions  Medication Sig Dispense Refill  . allopurinol (ZYLOPRIM) 100 MG tablet Take 200 mg by mouth daily.        Marland Kitchen aspirin 325 MG tablet Take 325 mg by mouth daily.        . furosemide (LASIX) 20 MG tablet .      . gabapentin (NEURONTIN) 300 MG capsule Take 300 mg  by mouth 2 (two) times daily.        . metFORMIN (GLUCOPHAGE) 850 MG tablet Take 1 tablet (850 mg total) by mouth 2 (two) times daily with a meal.  60 tablet  1  . metoprolol (LOPRESSOR) 50 MG tablet Take 50 mg by mouth 2 (two) times daily.      Marland Kitchen omeprazole (PRILOSEC) 20 MG capsule Take 20 mg by mouth every morning.        . predniSONE (DELTASONE) 5 MG tablet Take 5 mg by mouth daily.        . simvastatin (ZOCOR) 40 MG tablet Take 0.5 tablets (20 mg total) by mouth at bedtime.  30 tablet  1  . traMADol (ULTRAM) 50 MG tablet Take 50 mg by mouth every 6 (six) hours as needed.         Allergies  Allergen Reactions  . Penicillins Anaphylaxis    History   Social History  . Marital Status: Married    Spouse Name: N/A    Number of Children: N/A  . Years of Education: N/A   Occupational History  . Not on file.   Social History Main Topics  . Smoking status: Never Smoker   . Smokeless tobacco: Never Used  . Alcohol Use: No  . Drug Use: No  . Sexually Active: No   Other Topics Concern  . Not on file   Social History Narrative  . No narrative on file    No family history on file.  Review of Systems:  As stated in the HPI and otherwise negative.   BP 124/77  Pulse 122  Ht 5\' 6"  (1.676 m)  Wt 192 lb (87.091 kg)  BMI 30.99 kg/m2  Physical Examination: General: Well developed, well nourished, NAD HEENT: OP clear, mucus membranes moist SKIN: warm, dry. No rashes. Neuro: No focal deficits Musculoskeletal: Muscle strength 5/5 all ext Psychiatric: Mood and affect normal Neck: No JVD, no carotid bruits, no thyromegaly, no lymphadenopathy. Lungs:Left lung crackles. No wheezes, rhonci.  Cardiovascular: Tachy, No murmurs, gallops or rubs. Abdomen:Soft. Bowel sounds present. Non-tender.  Extremities: Trace to 1+ bilateral lower extremity edema. Pulses are 2 + in the bilateral DP/PT.

## 2011-08-07 NOTE — Assessment & Plan Note (Addendum)
Stable s/p CABG. Continue current therapy including beta blocker. She continues to have sinus tachycardia. Will repeat echo to assess LvEF. Will also exclude pericardial effusion.

## 2011-08-07 NOTE — Assessment & Plan Note (Signed)
She has mild LE edema. She has been started on Lasix by her primary care doctor today. She has crackles on her left lung base. I have recommended CXR to evaluate today but she refuses. She will take her Lasix and let us know if her SOB does not resolve. We will call her with the results of the echo and check on her then.

## 2011-08-07 NOTE — Assessment & Plan Note (Signed)
She appears to have mild volume overload. Agree with low dose Lasix. She refuses CXR today. If her SOB does not improve with diuresis, she will definetely need a CXR and we may need to consider CTA to exclude PE given her SOB and tachycardia.

## 2011-08-07 NOTE — Patient Instructions (Signed)
Your physician recommends that you schedule a follow-up appointment in: 4 weeks.  Your physician has requested that you have an echocardiogram. To be done in 3 weeks. Echocardiography is a painless test that uses sound waves to create images of your heart. It provides your doctor with information about the size and shape of your heart and how well your heart's chambers and valves are working. This procedure takes approximately one hour. There are no restrictions for this procedure.

## 2011-08-12 ENCOUNTER — Other Ambulatory Visit: Payer: Self-pay

## 2011-08-12 ENCOUNTER — Emergency Department (HOSPITAL_COMMUNITY): Payer: Medicare Other

## 2011-08-12 ENCOUNTER — Encounter (HOSPITAL_COMMUNITY): Payer: Self-pay | Admitting: Physical Medicine and Rehabilitation

## 2011-08-12 ENCOUNTER — Inpatient Hospital Stay (HOSPITAL_COMMUNITY)
Admission: EM | Admit: 2011-08-12 | Discharge: 2011-08-24 | DRG: 166 | Disposition: A | Discharge status: Skilled Nursing Facility | Payer: Medicare Other | Source: Ambulatory Visit | Attending: Pulmonary Disease | Admitting: Pulmonary Disease

## 2011-08-12 DIAGNOSIS — N179 Acute kidney failure, unspecified: Secondary | ICD-10-CM

## 2011-08-12 DIAGNOSIS — I319 Disease of pericardium, unspecified: Secondary | ICD-10-CM | POA: Diagnosis present

## 2011-08-12 DIAGNOSIS — I2510 Atherosclerotic heart disease of native coronary artery without angina pectoris: Secondary | ICD-10-CM | POA: Diagnosis present

## 2011-08-12 DIAGNOSIS — I10 Essential (primary) hypertension: Secondary | ICD-10-CM | POA: Diagnosis present

## 2011-08-12 DIAGNOSIS — R1310 Dysphagia, unspecified: Secondary | ICD-10-CM | POA: Diagnosis present

## 2011-08-12 DIAGNOSIS — J9601 Acute respiratory failure with hypoxia: Secondary | ICD-10-CM

## 2011-08-12 DIAGNOSIS — J479 Bronchiectasis, uncomplicated: Secondary | ICD-10-CM | POA: Diagnosis present

## 2011-08-12 DIAGNOSIS — I5022 Chronic systolic (congestive) heart failure: Secondary | ICD-10-CM | POA: Diagnosis present

## 2011-08-12 DIAGNOSIS — Q248 Other specified congenital malformations of heart: Secondary | ICD-10-CM

## 2011-08-12 DIAGNOSIS — H906 Mixed conductive and sensorineural hearing loss, bilateral: Secondary | ICD-10-CM | POA: Diagnosis present

## 2011-08-12 DIAGNOSIS — J9600 Acute respiratory failure, unspecified whether with hypoxia or hypercapnia: Secondary | ICD-10-CM

## 2011-08-12 DIAGNOSIS — Z86718 Personal history of other venous thrombosis and embolism: Secondary | ICD-10-CM

## 2011-08-12 DIAGNOSIS — R05 Cough: Secondary | ICD-10-CM

## 2011-08-12 DIAGNOSIS — Z951 Presence of aortocoronary bypass graft: Secondary | ICD-10-CM

## 2011-08-12 DIAGNOSIS — I6529 Occlusion and stenosis of unspecified carotid artery: Secondary | ICD-10-CM | POA: Diagnosis present

## 2011-08-12 DIAGNOSIS — I5189 Other ill-defined heart diseases: Secondary | ICD-10-CM

## 2011-08-12 DIAGNOSIS — Q240 Dextrocardia: Secondary | ICD-10-CM

## 2011-08-12 DIAGNOSIS — I3139 Other pericardial effusion (noninflammatory): Secondary | ICD-10-CM

## 2011-08-12 DIAGNOSIS — I214 Non-ST elevation (NSTEMI) myocardial infarction: Secondary | ICD-10-CM

## 2011-08-12 DIAGNOSIS — R0600 Dyspnea, unspecified: Secondary | ICD-10-CM

## 2011-08-12 DIAGNOSIS — M129 Arthropathy, unspecified: Secondary | ICD-10-CM | POA: Diagnosis present

## 2011-08-12 DIAGNOSIS — I2699 Other pulmonary embolism without acute cor pulmonale: Secondary | ICD-10-CM

## 2011-08-12 DIAGNOSIS — I2692 Saddle embolus of pulmonary artery without acute cor pulmonale: Principal | ICD-10-CM

## 2011-08-12 DIAGNOSIS — E876 Hypokalemia: Secondary | ICD-10-CM

## 2011-08-12 DIAGNOSIS — K219 Gastro-esophageal reflux disease without esophagitis: Secondary | ICD-10-CM | POA: Diagnosis present

## 2011-08-12 DIAGNOSIS — E785 Hyperlipidemia, unspecified: Secondary | ICD-10-CM

## 2011-08-12 DIAGNOSIS — I313 Pericardial effusion (noninflammatory): Secondary | ICD-10-CM

## 2011-08-12 DIAGNOSIS — Z7982 Long term (current) use of aspirin: Secondary | ICD-10-CM

## 2011-08-12 DIAGNOSIS — I252 Old myocardial infarction: Secondary | ICD-10-CM

## 2011-08-12 DIAGNOSIS — R5381 Other malaise: Secondary | ICD-10-CM | POA: Diagnosis not present

## 2011-08-12 DIAGNOSIS — I2589 Other forms of chronic ischemic heart disease: Secondary | ICD-10-CM | POA: Diagnosis present

## 2011-08-12 DIAGNOSIS — Z66 Do not resuscitate: Secondary | ICD-10-CM | POA: Diagnosis not present

## 2011-08-12 DIAGNOSIS — I824Y9 Acute embolism and thrombosis of unspecified deep veins of unspecified proximal lower extremity: Secondary | ICD-10-CM | POA: Diagnosis present

## 2011-08-12 DIAGNOSIS — K449 Diaphragmatic hernia without obstruction or gangrene: Secondary | ICD-10-CM | POA: Diagnosis present

## 2011-08-12 DIAGNOSIS — E119 Type 2 diabetes mellitus without complications: Secondary | ICD-10-CM

## 2011-08-12 DIAGNOSIS — Z79899 Other long term (current) drug therapy: Secondary | ICD-10-CM

## 2011-08-12 DIAGNOSIS — G589 Mononeuropathy, unspecified: Secondary | ICD-10-CM | POA: Diagnosis present

## 2011-08-12 LAB — POCT I-STAT, CHEM 8
Creatinine, Ser: 1.3 mg/dL — ABNORMAL HIGH (ref 0.50–1.10)
Glucose, Bld: 212 mg/dL — ABNORMAL HIGH (ref 70–99)
Hemoglobin: 13.6 g/dL (ref 12.0–15.0)
Sodium: 135 mEq/L (ref 135–145)
TCO2: 25 mmol/L (ref 0–100)

## 2011-08-12 LAB — URINALYSIS, ROUTINE W REFLEX MICROSCOPIC
Leukocytes, UA: NEGATIVE
Nitrite: NEGATIVE
Protein, ur: NEGATIVE mg/dL
Specific Gravity, Urine: >1.046 — ABNORMAL HIGH (ref 1.005–1.030)
Urobilinogen, UA: 1 mg/dL (ref 0.0–1.0)

## 2011-08-12 LAB — COMPREHENSIVE METABOLIC PANEL
ALT: 11 U/L (ref 0–35)
BUN: 29 mg/dL — ABNORMAL HIGH (ref 6–23)
CO2: 24 mEq/L (ref 19–32)
Calcium: 8.5 mg/dL (ref 8.4–10.5)
Creatinine, Ser: 1.15 mg/dL — ABNORMAL HIGH (ref 0.50–1.10)
GFR calc Af Amer: 55 mL/min — ABNORMAL LOW (ref >90)
GFR calc non Af Amer: 47 mL/min — ABNORMAL LOW (ref >90)
Glucose, Bld: 197 mg/dL — ABNORMAL HIGH (ref 70–99)
Sodium: 134 mEq/L — ABNORMAL LOW (ref 135–145)

## 2011-08-12 LAB — LACTIC ACID, PLASMA: Lactic Acid, Venous: 1.5 mmol/L (ref 0.5–2.2)

## 2011-08-12 LAB — DIFFERENTIAL
Basophils Relative: 0 % (ref 0–1)
Monocytes Absolute: 1.4 10*3/uL — ABNORMAL HIGH (ref 0.1–1.0)
Monocytes Relative: 8 % (ref 3–12)
Neutro Abs: 14.5 10*3/uL — ABNORMAL HIGH (ref 1.7–7.7)

## 2011-08-12 LAB — CBC
HCT: 38.1 % (ref 36.0–46.0)
Hemoglobin: 12.5 g/dL (ref 12.0–15.0)
MCH: 29.1 pg (ref 26.0–34.0)
MCHC: 32.8 g/dL (ref 30.0–36.0)
MCV: 88.6 fL (ref 78.0–100.0)

## 2011-08-12 LAB — CARDIAC PANEL(CRET KIN+CKTOT+MB+TROPI): Total CK: 28 U/L (ref 7–177)

## 2011-08-12 LAB — PRO B NATRIURETIC PEPTIDE: Pro B Natriuretic peptide (BNP): 528 pg/mL — ABNORMAL HIGH (ref 0–125)

## 2011-08-12 LAB — PROTIME-INR: Prothrombin Time: 15.2 seconds (ref 11.6–15.2)

## 2011-08-12 MED ORDER — ASPIRIN 81 MG PO CHEW: 81.0000 mg | CHEWABLE_TABLET | ORAL | Status: AC

## 2011-08-12 MED ORDER — SODIUM CHLORIDE 0.9 % IV BOLUS (SEPSIS)
500.0000 mL | Freq: Once | INTRAVENOUS | Status: AC
  Administered 2011-08-12: 500 mL via INTRAVENOUS

## 2011-08-12 MED ORDER — ASPIRIN 300 MG RE SUPP
300.0000 mg | RECTAL | Status: AC
  Administered 2011-08-13: 300 mg via RECTAL
  Filled 2011-08-12: qty 1

## 2011-08-12 MED ORDER — SODIUM CHLORIDE 0.9 % IV SOLN: 250.0000 mL | INTRAVENOUS | Status: DC | PRN

## 2011-08-12 MED ORDER — DEXTROSE 5 % IV SOLN
3.0000 g | INTRAVENOUS | Status: AC
  Administered 2011-08-12: 3 g via INTRAVENOUS
  Filled 2011-08-12 (×2): qty 6

## 2011-08-12 MED ORDER — MORPHINE SULFATE 2 MG/ML IJ SOLN
2.0000 mg | INTRAMUSCULAR | Status: DC | PRN
  Administered 2011-08-13 – 2011-08-22 (×41): 2 mg via INTRAVENOUS
  Filled 2011-08-12 (×42): qty 1

## 2011-08-12 MED ORDER — HEPARIN (PORCINE) IN NACL 100-0.45 UNIT/ML-% IJ SOLN: 1400.0000 [IU]/h | INTRAMUSCULAR | Status: DC

## 2011-08-12 MED ORDER — HEPARIN (PORCINE) IN NACL 100-0.45 UNIT/ML-% IJ SOLN
1250.0000 [IU]/h | INTRAMUSCULAR | Status: DC
  Administered 2011-08-12: 1150 [IU]/h via INTRAVENOUS
  Administered 2011-08-13 – 2011-08-15 (×3): 1250 [IU]/h via INTRAVENOUS
  Filled 2011-08-12 (×7): qty 250

## 2011-08-12 MED ORDER — HEPARIN BOLUS VIA INFUSION
4000.0000 [IU] | Freq: Once | INTRAVENOUS | Status: AC
  Administered 2011-08-12: 4000 [IU] via INTRAVENOUS

## 2011-08-12 MED ORDER — LORAZEPAM 2 MG/ML IJ SOLN
INTRAMUSCULAR | Status: AC
  Filled 2011-08-12: qty 1

## 2011-08-12 MED ORDER — LACTATED RINGERS IV BOLUS (SEPSIS): 1000.0000 mL | INTRAVENOUS | Status: AC | PRN

## 2011-08-12 MED ORDER — IOHEXOL 350 MG/ML SOLN
80.0000 mL | Freq: Once | INTRAVENOUS | Status: AC | PRN
  Administered 2011-08-12: 80 mL via INTRAVENOUS

## 2011-08-12 NOTE — ED Notes (Signed)
Pt presents to department for evaluation of SOB. Pt states recent difficulty swallowing and taking PO medications at home. States she has esophageal scar tissue, states she vomits when attempting to take medications by mouth. Pt also states coughing and fatigue. Diminished lung sounds, labored respirations. Skin cool and clammy. Tachycardic on monitor. She is conscious alert and oriented x4. Swelling and warmth to RLE. Pt states she hasn't been able to take "fluid pills" at home. Chest sore to palpation from bypass surgery. 5/10 pain at the time.

## 2011-08-12 NOTE — Progress Notes (Signed)
*  PRELIMINARY RESULTS* Echocardiogram 2D Echocardiogram has been performed.  [name] R 08/12/2011, 10:07 PM

## 2011-08-12 NOTE — Progress Notes (Signed)
eLink Physician-Brief Progress Note Patient Name: [name] DOB: July 28, 1940 MRN: [id number]  Date of Service  08/12/2011   HPI/Events of Note   Lab 08/12/11 2010 08/12/11 1804 08/12/11 1750  NA -- 135 134*  K -- 4.0 3.9  CL -- 100 96  CO2 -- -- 24  GLUCOSE -- 212* 197*  BUN -- 30* 29*  CREATININE -- 1.30* 1.15*  CALCIUM -- -- 8.5  MG 0.9* -- --  PHOS 2.8 -- --      eICU Interventions  Replete with 3gm mag Patient will be going to 2905   Intervention Category Major Interventions: Electrolyte abnormality - evaluation and management  Gianella Chismar 08/12/2011, 9:39 PM

## 2011-08-12 NOTE — ED Notes (Signed)
Pulmonary Critical Care MD aware of Mag level of 0.9, orders to be written upstairs.

## 2011-08-12 NOTE — Progress Notes (Addendum)
eLink Physician-Brief Progress Note Patient Name: [name] DOB: 09-Sep-1940 MRN: [id number]  Date of Service  08/12/2011   HPI/Events of Note  Hard read due to dextrocardia  LV normal. EF 55% ? Septal paradoxical RV dilated abnd some hypokinesis. PASP around 60mm Hg and elevated  Also possible Left atrial mass   Moderate Pericardial effusion possibly due to CABG   eICU Interventions  Appareciated Dr Donnie Aho call with findings  Consider  TEE per Dr Donnie Aho   Intervention Category Intermediate Interventions: Communication with other healthcare providers and/or family  Orlo Brickle 08/12/2011, 10:30 PM

## 2011-08-12 NOTE — ED Notes (Signed)
Pt resting quietly at the time. Respirations remain labored. BIPAP refused, pt unable to tolerate at present. Provider notified. Vital signs stable.

## 2011-08-12 NOTE — Progress Notes (Addendum)
eLink Physician-Brief Progress Note Patient Name: [name] DOB: [birth year] MRN: [id number]  Date of Service  08/12/2011   HPI/Events of Note   Call from Dr Particia Lather  eICU Interventions  Submassive PE =- PCCM will admit. DR Tula Nakayama informed Need 2d Echo given recent ef 45% to look at rV and LV in this sistus inversus patient - dr Donnie Aho fellow on call informed  Admit patient to ICU  Full code per Dr Lendell Caprice    Intervention Category Major Interventions: Other:  Teneka Malmberg 08/12/2011, 8:16 PM

## 2011-08-12 NOTE — ED Notes (Addendum)
Pt presents to department from home via [facility] for evaluation of SOB. Had cardiac bypass surgery on 06/14/11. Pt states recent difficulty swallowing medications, also states nausea and inability to keep medications down. States she has esophageal scar tissue. Swelling to RLE. Also states coughing and SOB on exertion. Pt is conscious alert and oriented x4. Diminished lung sounds upon arrival.

## 2011-08-12 NOTE — ED Provider Notes (Signed)
History     CSN: [id number]  Arrival date & time 08/12/11  1721   First MD Initiated Contact with Patient 08/12/11 1736      Chief Complaint  Patient presents with  . Shortness of Breath    (Consider location/radiation/quality/duration/timing/severity/associated sxs/prior treatment) HPI History provided by EMS, the patient, and prior medical records History by the patient limited secondary to poor historian and anxiety/respiratory distress.  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 Patient states that her coughing began gradually "days ago" and is without sputum production or hemoptysis. She has also experienced an unknown duration of shortness of breath which acutely worsened this afternoon.  Patient endorses moderate upper or mid back pain of unknown duration which is new. No associated chest pain, abdominal pain, fever, sweats or chills. Patient has had difficulty taking her medication today secondary to difficulty swallowing (some at baseline) plus nausea and occasional vomiting. Patient is also noted increased lower extremity edema, more on the right, for which she is taking "water pills." Patient does report a history of remote DVT; no known history of PE and patient denies recent travel or surgery in the last 4 weeks.  Prior records note the patient was seen about 5 days ago and cardiology clinic for followup after her CABG on 06/14/2011 and noted the patient was having shortness of breath, LE edema, and cough at that time.  Prior records noted possible need for PE evaluation and patient refusing a chest x-ray at that time. Patient reports current shortness of breath is worse in any prior symptoms. No O2 use at home and patient does not have a history of COPD.    Past Medical History  Diagnosis Date  . Arthritis   . Neuropathy   . GERD (gastroesophageal reflux disease)   . Hypertension   . HLD  (hyperlipidemia)   . Situs inversus with dextrocardia     CT 06/2011: situs inversus totalis.  What would typically be called RCA arose from anterior sinus of Valsalva.  What would typically be called the left main arises from the posterior sinus of Valsalva and gives rise to a large first diagonal branch and diminutive circumflex  . Dextrocardia   . Fracture 05/24/2011    right; "did not have surgery"  . DVT of leg (deep venous thrombosis) ~2006    left  . Pneumonia 06/13/11    "couple times; long time ago"  . CAD (coronary artery disease)     NSTEMI in setting of gallstone pancreatitis 05/2011:  LHC with 3v CAD; CABG was performed 06/14/11: RIMA-LAD, SVG-ramus, SVG-RCA  . Ischemic cardiomyopathy     Echocardiogram 06/05/11: EF 40-45%, anteroseptal hypokinesis, apical hypokinesis, mild LAE  . Chronic bronchitis   . Anemia   . H/O hiatal hernia   . Chronic systolic heart failure   . Carotid stenosis     Dopplers 06/12/11: LICA 60-79%.    Past Surgical History  Procedure Date  . Ercp 06/03/2011    Procedure: ENDOSCOPIC RETROGRADE CHOLANGIOPANCREATOGRAPHY (ERCP);  Surgeon: Petra Kuba, MD;  Location[facility] OR;  Service: Endoscopy;  Laterality: N/A;  . Vaginal hysterectomy 1970's  . Cardiac catheterization 06/11/11  . Coronary artery bypass graft 06/14/2011    Procedure: CORONARY ARTERY BYPASS GRAFTING (CABG);  Surgeon: Alleen Borne, MD;  Location: [facility] OR;  Service: Open Heart Surgery;  Laterality: N/A;    History reviewed. No pertinent family history.  History  Substance Use Topics  . Smoking status: Never Smoker   . Smokeless tobacco: Never Used  . Alcohol Use: No    OB History    Grav Para Term Preterm Abortions TAB SAB Ect Mult Living                  Review of Systems  Constitutional: Negative for fever and chills.  HENT: Negative for congestion, sore throat and rhinorrhea.   Eyes: Negative for pain and visual disturbance.  Respiratory: Positive for cough and shortness of  breath. Negative for wheezing.   Cardiovascular: Positive for leg swelling. Negative for chest pain and palpitations.  Gastrointestinal: Positive for nausea and vomiting. Negative for abdominal pain, diarrhea and blood in stool.  Genitourinary: Negative for dysuria and hematuria.  Musculoskeletal: Positive for back pain. Negative for gait problem.  Skin: Positive for wound (healing surgical wound and RLE wound from vein harvest). Negative for rash.  Neurological: Negative for dizziness and headaches.  Psychiatric/Behavioral: Negative for confusion and agitation.  All other systems reviewed and are negative.    Allergies  Penicillins  Home Medications   Current Outpatient Rx  Name Route Sig Dispense Refill  . ALLOPURINOL 100 MG PO TABS Oral Take 200 mg by mouth daily.      . ASPIRIN 325 MG PO TABS Oral Take 325 mg by mouth daily.      . FUROSEMIDE 20 MG PO TABS  .    Marland Kitchen GABAPENTIN 300 MG PO CAPS Oral Take 300 mg by mouth 2 (two) times daily.      Marland Kitchen METFORMIN HCL 850 MG PO TABS Oral Take 1 tablet (850 mg total) by mouth 2 (two) times daily with a meal. 60 tablet 1  . METOPROLOL TARTRATE 50 MG PO TABS Oral Take 50 mg by mouth 2 (two) times daily.    Marland Kitchen OMEPRAZOLE 20 MG PO CPDR Oral Take 20 mg by mouth every morning.      Marland Kitchen PREDNISONE 5 MG PO TABS Oral Take 5 mg by mouth daily.      Marland Kitchen SIMVASTATIN 40 MG PO TABS Oral Take 0.5 tablets (20 mg total) by mouth at bedtime. 30 tablet 1  . TRAMADOL HCL 50 MG PO TABS Oral Take 50 mg by mouth every 6 (six) hours as needed.       BP 12477  Pulse 122  Temp(Src) 97.5 F (36.4 C) (Oral)  Resp 26  SpO2 98%  Physical Exam  Nursing note and vitals reviewed. Constitutional: She is oriented to person, place, and time.       Morbidly obese, moderate respiratory distress 2/2 anxiety and tachypnea to the upper 20s, normal speech  HENT:  Head: Normocephalic and atraumatic.  Right Ear: External ear normal.  Left Ear: External ear normal.  Nose: Nose  normal.       Mildly dry mucous membranes  Eyes: Conjunctivae and EOM are normal. Pupils are equal, round, and reactive to light.  Neck: Normal range of motion. Neck supple.  Cardiovascular: Regular rhythm and intact distal pulses.  Tachycardia present.   No murmur heard. Pulmonary/Chest: She is in respiratory distress. She has no wheezes (tachypnea to upper 20s). She has rales ( In bilateral bases). She exhibits tenderness (Moderately in the central chest).       Midline scar consistent with recent CABG, appears well healing  Abdominal: Soft. Bowel sounds are normal. She exhibits no distension. There is no rebound and no guarding.       Obese,  mildly tender in the epigastrium (with associated small surgical wounds)  Musculoskeletal:       Bilateral lower extremities with pitting edema, about 1-2+ on the left and about 3+ on the right with mild erythema on the distal medial leg just proximal to the ankle as well as medial leg just distal to the knee associated with a possible surgical wound (per patient); mild associated tenderness as well as warmth  Mild tenderness to palpation of patient's mid T. spine in the paraspinal muscles  Neurological: She is alert and oriented to person, place, and time.  Skin: Skin is warm and dry. She is not diaphoretic.       See above for leg findings  Psychiatric: Judgment normal. Her mood appears anxious.    ED Course  Procedures (including critical care time)   Date: 08/12/2011  Rate: 138  Rhythm: likely sinus tach, although poor baseline  QRS Axis:  RAD  Intervals: normal  ST/T Wave abnormalities: no ST elevation or depression, possible TWI in lateral limb leads  Old EKG Reviewed:  Decreased voltage with new Q waves in precordial leads   Date: 08/12/2011 (repeated with Rt-sided leads)  Rate: 132  Rhythm: ST  QRS Axis: normal  Intervals: normal  ST/T Wave abnormalities: normal Q wave in anteroseptal leads with poor R progression   Labs  Reviewed  CBC - Abnormal; Notable for the following:    WBC 18.2 (*)    RDW 16.2 (*)    All other components within normal limits  DIFFERENTIAL - Abnormal; Notable for the following:    Neutrophils Relative 80 (*)    Neutro Abs 14.5 (*)    Monocytes Absolute 1.4 (*)    All other components within normal limits  PRO B NATRIURETIC PEPTIDE - Abnormal; Notable for the following:    Pro B Natriuretic peptide (BNP) 528.0 (*)    All other components within normal limits  URINALYSIS, ROUTINE W REFLEX MICROSCOPIC - Abnormal; Notable for the following:    Specific Gravity, Urine >1.046 (*)    Ketones, ur 15 (*)    All other components within normal limits  COMPREHENSIVE METABOLIC PANEL - Abnormal; Notable for the following:    Sodium 134 (*)    Glucose, Bld 197 (*)    BUN 29 (*)    Creatinine, Ser 1.15 (*)    Albumin 2.6 (*)    Alkaline Phosphatase 135 (*)    GFR calc non Af Amer 47 (*)    GFR calc Af Amer 55 (*)    All other components within normal limits  POCT I-STAT, CHEM 8 - Abnormal; Notable for the following:    BUN 30 (*)    Creatinine, Ser 1.30 (*)    Glucose, Bld 212 (*)    Calcium, Ion 1.02 (*)    All other components within normal limits  CARDIAC PANEL(CRET KIN+CKTOT+MB+TROPI)  PROTIME-INR  APTT  APTT  LACTIC ACID, PLASMA  MAGNESIUM  PHOSPHORUS  PROCALCITONIN   Ct Angio Chest W/cm &/or Wo Cm  08/12/2011  **ADDENDUM** CREATED: 08/12/2011 19:28:02  Critical Value/emergent results were called by telephone at the time of interpretation on 08/12/2011  at 1920 hours  to  Dr. Lendell Caprice, who verbally acknowledged these results.  **END ADDENDUM** SIGNED BY: Marlowe Aschoff. Hoss, M.D.    08/12/2011  *RADIOLOGY REPORT*  Clinical Data: Short of breath and chest pain  CT ANGIOGRAPHY CHEST  Technique:  Multidetector CT imaging of the chest using the standard protocol during bolus administration of  intravenous contrast. Multiplanar reconstructed images including MIPs were obtained and reviewed to  evaluate the vascular anatomy.  Contrast: 80mL OMNIPAQUE IOHEXOL 350 MG/ML IV SOLN  Comparison: 06/12/2011  Findings: A saddle embolus is seen extending across the bifurcation of the main pulmonary artery into both left and right pulmonary artery branches as well as lobe are branches.  This is consistent with extensive pulmonary thromboembolism.  The right ventricle is somewhat dilated with respect to that of the left ventricle consistent with an increased mortality rate of this condition.  Dextrocardia with situs inversus is again noted.  Small mediastinal nodes.  No pericardial effusion.  Small right pleural effusion.  Bibasilar atelectasis. Right basilar bronchiectasis is noted.  Small hiatal hernia.  Small gallstone is suspected.  IMPRESSION: Extensive pulmonary thromboembolism with dilatation of the right ventricle.  Situs inversus.  Bibasilar atelectasis.  Small platter pleural effusion.  Cholelithiasis is suspected.  Original Report Authenticated By: Donavan Burnet, M.D.   Dg Chest Portable 1 View  08/12/2011  *RADIOLOGY REPORT*  Clinical Data: Shortness of breath.  PORTABLE CHEST - 1 VIEW  Comparison: 07/17/2011.  Findings: Situs inversus is again noted.  There are surgical changes from bypass surgery.  There is a small right-sided pleural effusion with overlying atelectasis.  Minimal left basilar atelectasis also.  No edema or pneumothorax.  IMPRESSION: Small right effusion and bibasilar atelectasis.  Original Report Authenticated By: P. Loralie Champagne, M.D.     1. Dyspnea   2. Saddle pulmonary embolus   3. Bilateral pulmonary embolism       MDM  71 year old female with history of CAD status post recent CABG presented with complaint of dyspnea and upper back pain. No fever; frequent cough.  Exam as above, AF, tachycardic, tachypneic, hypoxic requiring about 4 L [place] O2, rales in the bases, significant asymmetrical right lower extremity edema with mild erythema. Clinical picture concerning for  PE or CHF exacerbation. EKG with change from prior, namely decreased voltage without acute ischemia.  CXR with small Rt pleural effusion.  Labs with neg trop, stable creat, leukocytosis, BNP of 528 (improved from prior), and Nl lactate.  CT PE study with extensive bilateral PEs, including large Lt proximal PE c/w saddle PE with associated Rt heart strain.  Heparin bolus/gtt ordered.  Critical care notified 2/2 Sn of hemodynamic compromise and will admit to the ICU.  Additional labs ordered/pending.  No acute issues prior to pt transfer.      Particia Lather, MD 08/13/11 [phone]

## 2011-08-12 NOTE — ED Notes (Signed)
Report called to 2900, pt venous study just began will transport after study complete.

## 2011-08-13 ENCOUNTER — Inpatient Hospital Stay (HOSPITAL_COMMUNITY): Payer: Medicare Other

## 2011-08-13 ENCOUNTER — Encounter (HOSPITAL_COMMUNITY): Payer: Self-pay | Admitting: Internal Medicine

## 2011-08-13 DIAGNOSIS — I2699 Other pulmonary embolism without acute cor pulmonale: Secondary | ICD-10-CM

## 2011-08-13 LAB — BASIC METABOLIC PANEL
Calcium: 8.5 mg/dL (ref 8.4–10.5)
GFR calc non Af Amer: 52 mL/min — ABNORMAL LOW (ref >90)
Glucose, Bld: 171 mg/dL — ABNORMAL HIGH (ref 70–99)
Sodium: 136 mEq/L (ref 135–145)

## 2011-08-13 LAB — PHOSPHORUS: Phosphorus: 3.8 mg/dL (ref 2.3–4.6)

## 2011-08-13 LAB — CARDIAC PANEL(CRET KIN+CKTOT+MB+TROPI)
CK, MB: 1.2 ng/mL (ref 0.3–4.0)
Relative Index: INVALID (ref 0.0–2.5)
Relative Index: INVALID (ref 0.0–2.5)
Troponin I: <0.3 ng/mL (ref <0.30)
Troponin I: <0.3 ng/mL (ref <0.30)

## 2011-08-13 LAB — CBC
Hemoglobin: 11.7 g/dL — ABNORMAL LOW (ref 12.0–15.0)
MCH: 28.7 pg (ref 26.0–34.0)
MCHC: 32.2 g/dL (ref 30.0–36.0)
Platelets: 293 10*3/uL (ref 150–400)

## 2011-08-13 LAB — MRSA PCR SCREENING: MRSA by PCR: POSITIVE — AB

## 2011-08-13 MED ORDER — ASPIRIN 325 MG PO TABS
325.0000 mg | ORAL_TABLET | Freq: Every day | ORAL | Status: DC
  Administered 2011-08-13 – 2011-08-24 (×12): 325 mg via ORAL
  Filled 2011-08-13 (×13): qty 1

## 2011-08-13 MED ORDER — IOHEXOL 300 MG/ML  SOLN
150.0000 mL | Freq: Once | INTRAMUSCULAR | Status: AC | PRN
  Administered 2011-08-13: 40 mL via INTRAVENOUS

## 2011-08-13 MED ORDER — CHLORHEXIDINE GLUCONATE CLOTH 2 % EX PADS
6.0000 | MEDICATED_PAD | Freq: Every day | CUTANEOUS | Status: AC
  Administered 2011-08-13 – 2011-08-17 (×5): 6 via TOPICAL

## 2011-08-13 MED ORDER — BIOTENE DRY MOUTH MT LIQD
15.0000 mL | Freq: Two times a day (BID) | OROMUCOSAL | Status: DC
  Administered 2011-08-13 – 2011-08-16 (×7): 15 mL via OROMUCOSAL

## 2011-08-13 MED ORDER — SIMVASTATIN 20 MG PO TABS
20.0000 mg | ORAL_TABLET | Freq: Every day | ORAL | Status: DC
  Administered 2011-08-13 – 2011-08-23 (×11): 20 mg via ORAL
  Filled 2011-08-13 (×12): qty 1

## 2011-08-13 MED ORDER — PANTOPRAZOLE SODIUM 40 MG PO TBEC
40.0000 mg | DELAYED_RELEASE_TABLET | Freq: Every day | ORAL | Status: DC
  Administered 2011-08-13 – 2011-08-19 (×6): 40 mg via ORAL
  Filled 2011-08-13 (×6): qty 1

## 2011-08-13 MED ORDER — MUPIROCIN 2 % EX OINT
1.0000 "application " | TOPICAL_OINTMENT | Freq: Two times a day (BID) | CUTANEOUS | Status: AC
  Administered 2011-08-13 – 2011-08-17 (×10): 1 via NASAL
  Filled 2011-08-13 (×2): qty 22

## 2011-08-13 MED ORDER — SODIUM CHLORIDE 0.9 % IV SOLN
INTRAVENOUS | Status: DC
  Administered 2011-08-13: 20:00:00 via INTRAVENOUS
  Administered 2011-08-15: 20 mL/h via INTRAVENOUS
  Administered 2011-08-20: 1000 mL via INTRAVENOUS

## 2011-08-13 MED ORDER — CHLORHEXIDINE GLUCONATE CLOTH 2 % EX PADS: 6.0000 | MEDICATED_PAD | Freq: Every day | CUTANEOUS | Status: DC

## 2011-08-13 MED ORDER — MORPHINE SULFATE 2 MG/ML IJ SOLN
INTRAMUSCULAR | Status: AC
  Administered 2011-08-13: 2 mg via INTRAVENOUS
  Filled 2011-08-13: qty 1

## 2011-08-13 MED ORDER — ALLOPURINOL 100 MG PO TABS
200.0000 mg | ORAL_TABLET | Freq: Every day | ORAL | Status: DC
  Administered 2011-08-13 – 2011-08-24 (×12): 200 mg via ORAL
  Filled 2011-08-13 (×12): qty 2

## 2011-08-13 MED ORDER — GABAPENTIN 300 MG PO CAPS
300.0000 mg | ORAL_CAPSULE | Freq: Two times a day (BID) | ORAL | Status: DC
  Administered 2011-08-13 – 2011-08-24 (×23): 300 mg via ORAL
  Filled 2011-08-13 (×24): qty 1

## 2011-08-13 NOTE — Progress Notes (Signed)
Inpatient Diabetes Program Recommendations  AACE/ADA: New Consensus Statement on Inpatient Glycemic Control  Target Ranges:  Prepandial:   less than 140 mg/dL      Peak postprandial:   less than 180 mg/dL (1-2 hours)      Critically ill patients:  140 - 180 mg/dL  Pager:  [phone] Hours:  8 am-10pm   Reason for Visit: History of Diabetes and elevated lab glucose: 212 mg/dL  Inpatient Diabetes Program Recommendations Correction (SSI): Add Novolog Correction

## 2011-08-13 NOTE — Progress Notes (Signed)
Patient name: [name] Medical record number: [id number] Date of birth: 02/08/1941 Age: 71 y.o. Gender: female PCP: READE,Shawneequa Baldridge ALEXANDER, MD, MD  PCCM ADMISSION NOTE  Date: 08/12/2011 History of Present Illness: Ms. [name] is a 71-year-old lady who underwent a coronary bypass grafting in January, who presented today with right-sided pleuritic chest pain and dyspnea. She underwent CT scanning which showed a large saddle embolus. She was started on a heparin drip and admitted to the ICU for further evaluation. BNP and troponin were negative in ED. Echocardiogram showed right heart strain and a possible mass in the left atrium.  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  Lines/tubes :   Microbiology/Sepsis markers: None   Anti-infectives    None      Protocols:  None  Consults:      Studies: Ct Angio Chest W/cm &/or Wo Cm  08/12/2011  **ADDENDUM** CREATED: 08/12/2011 19:28:02  Critical Value/emergent results were called by telephone at the time of interpretation on 08/12/2011  at 1920 hours  to  Dr. Coker, who verbally acknowledged these results.  **END ADDENDUM** SIGNED BY: Arthur A. Hoss, M.D.    08/12/2011  *RADIOLOGY REPORT*  Clinical Data: Short of breath and chest pain  CT ANGIOGRAPHY CHEST  Technique:  Multidetector CT imaging of the chest using the standard protocol during bolus administration of intravenous contrast. Multiplanar reconstructed images including MIPs were obtained and reviewed to evaluate the vascular anatomy.  Contrast: 80mL OMNIPAQUE IOHEXOL 350 MG/ML IV SOLN  Comparison: 06/12/2011  Findings: A saddle embolus is seen extending across the bifurcation of the main pulmonary artery into both left and right pulmonary artery branches as well as lobe are branches.  This is consistent with extensive pulmonary thromboembolism.  The right ventricle is  somewhat dilated with respect to that of the left ventricle consistent with an increased mortality rate of this condition.  Dextrocardia with situs inversus is again noted.  Small mediastinal nodes.  No pericardial effusion.  Small right pleural effusion.  Bibasilar atelectasis. Right basilar bronchiectasis is noted.  Small hiatal hernia.  Small gallstone is suspected.  IMPRESSION: Extensive pulmonary thromboembolism with dilatation of the right ventricle.  Situs inversus.  Bibasilar atelectasis.  Small platter pleural effusion.  Cholelithiasis is suspected.  Original Report Authenticated By: ARTHUR A. HOSS, M.D.   Dg Chest Portable 1 View  08/12/2011  *RADIOLOGY REPORT*  Clinical Data: Shortness of breath.  PORTABLE CHEST - 1 VIEW  Comparison: 07/17/2011.  Findings: Situs inversus is again noted.  There are surgical changes from bypass surgery.  There is a small right-sided pleural effusion with overlying atelectasis.  Minimal left basilar atelectasis also.  No edema or pneumothorax.  IMPRESSION: Small right effusion and bibasilar atelectasis.  Original Report Authenticated By: P. MARK GALLERANI, M.D.     Events: LE dopplers 3/11 >> prelim positive for B LE DVT >>    Subjective: Nurse has seen and family describes some aspiration sx   Vital signs for last 24 hours: Temp:  [97.5 F (36.4 C)-101 F (38.3 C)] 98.2 F (36.8 C) (03/11 0731) Pulse Rate:  [112-138] 115  (03/11 1000) Resp:  [16-31] 21  (03/11 1000) BP: (94-118)/(39-81) 98/43 mmHg (03/11 1000) SpO2:  [90 %-100 %] 95 % (03/11 1000) FiO2 (%):  [40 %-50 %] 50 % (03/11 0731) Weight:  [86.9 kg (191 lb 9.3 oz)] 86.9 kg (191 lb 9.3 oz) (03/10   2245)  Intake/Output this shift: Total I/O In: 23 [I.V.:23] Out: 300 [Urine:300]  Vent settings for last 24 hours: Vent Mode:  [-]  FiO2 (%):  [40 %-50 %] 50 %  Physical Exam:  General: No apparent distress. Eyes: Anicteric sclerae. ENT: Oropharynx clear. Moist mucous membranes. No  thrush Lymph: No cervical, supraclavicular, or axillary lymphadenopathy. Heart: Normal S1, S2. No murmurs, rubs, or gallops appreciated. No bruits, equal pulses. Lungs: Diminished in the right base with crackles. Abdomen: Abdomen soft, non-tender and not distended, normoactive bowel sounds. No hepatosplenomegaly or masses. Musculoskeletal: No clubbing or synovitis. Skin: No rashes or lesions Neuro: No focal neurologic deficits.  BMET    Component Value Date/Time   NA 136 08/13/2011 0915   K 3.9 08/13/2011 0915   CL 99 08/13/2011 0915   CO2 24 08/13/2011 0915   GLUCOSE 171* 08/13/2011 0915   BUN 27* 08/13/2011 0915   CREATININE 1.07 08/13/2011 0915   CALCIUM 8.5 08/13/2011 0915   GFRNONAA 52* 08/13/2011 0915   GFRAA 60* 08/13/2011 0915   CBC    Component Value Date/Time   WBC 21.3* 08/13/2011 0915   RBC 4.07 08/13/2011 0915   HGB 11.7* 08/13/2011 0915   HCT 36.3 08/13/2011 0915   PLT 293 08/13/2011 0915   MCV 89.2 08/13/2011 0915   MCH 28.7 08/13/2011 0915   MCHC 32.2 08/13/2011 0915   RDW 16.2* 08/13/2011 0915   LYMPHSABS 2.2 08/12/2011 1743   MONOABS 1.4* 08/12/2011 1743   EOSABS 0.1 08/12/2011 1743   BASOSABS 0.1 08/12/2011 1743    Assessment/Plan Active Problems:  Acute respiratory failure with hypoxia  Saddle embolism of pulmonary artery  Pericardial effusion  Atrial mass  Pulmonary Embolism (radiographically demonstrated, spiral CT and without hemodynamic compromise) Bronchiectasis -- Dopplers of the lower extremity done this am, official read pending but appears to have B LE DVT  -- heparin drip. -- currently on 60% FiO2, hemodynamically stable; indications for lytics are narrow, would defer at this time.  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 Once  she is recovered from her acute illness it may be reasonable to refer her to [facility] for evaluation of this.  Dysphagia: -- will ask for swallowing eval   LOS: 1 day   Best practices / Disposition: -->Step-down status under PCCM -->DNR -->Heparin (treatment) for DVT/PE -->diet   Adriaan Maltese, MD, PhD 08/13/2011, 11:29 AM [facility] [phone] or if no answer [phone]    

## 2011-08-13 NOTE — Progress Notes (Signed)
PHARMACY - ANTICOAGULATION CONSULT INITIAL NOTE  Pharmacy Consult for: Heparin  Indication: pulmonary embolus    Patient Data:   Allergies: Allergies  Allergen Reactions  . Penicillins Anaphylaxis    Patient Measurements: Height: 5\' 6"  (167.6 cm) Weight: 191 lb 9.3 oz (86.9 kg) IBW/kg (Calculated) : 59.3  Adjusted Body Weight: 78 kg  Vital Signs: Temp:  [97.5 F (36.4 C)-101 F (38.3 C)] 101 F (38.3 C) (03/11 0000) Pulse Rate:  [128-138] 128  (03/11 0100) Resp:  [16-31] 28  (03/11 0100) BP: (94-118)/(39-81) 115/61 mmHg (03/11 0100) SpO2:  [90 %-100 %] 98 % (03/11 0100) Weight:  [191 lb 9.3 oz (86.9 kg)] 191 lb 9.3 oz (86.9 kg) (03/10 2245)  Intake/Output from previous day:  Intake/Output Summary (Last 24 hours) at 08/13/11 0306 Last data filed at 08/13/11 0100  Gross per 24 hour  Intake 184.35 ml  Output      0 ml  Net 184.35 ml    Labs:  Basename 08/12/11 2330 08/12/11 1804 08/12/11 1750 08/12/11 1744 08/12/11 1743  HGB -- 13.6 -- -- 12.5  HCT -- 40.0 -- -- 38.1  PLT -- -- -- -- 300  APTT -- -- 27 -- --  LABPROT -- -- 15.2 -- --  INR -- -- 1.18 -- --  HEPARINUNFRC -- -- -- -- --  CREATININE -- 1.30* 1.15* -- --  CKTOTAL 20 -- -- 28 --  CKMB 0.9 -- -- 1.0 --  TROPONINI <0.30 -- -- <0.30 --   Estimated Creatinine Clearance: 45.3 ml/min (by C-G formula based on Cr of 1.3).  Medical History: Past Medical History  Diagnosis Date  . Arthritis   . Neuropathy   . GERD (gastroesophageal reflux disease)   . Hypertension   . HLD (hyperlipidemia)   . Situs inversus with dextrocardia     CT 06/2011: situs inversus totalis.  What would typically be called RCA arose from anterior sinus of Valsalva.  What would typically be called the left main arises from the posterior sinus of Valsalva and gives rise to a large first diagonal branch and diminutive circumflex  . Dextrocardia   . Fracture 05/24/2011    right; "did not have surgery"  . DVT of leg (deep venous  thrombosis) ~2006    left  . Pneumonia 06/13/11    "couple times; long time ago"  . CAD (coronary artery disease)     NSTEMI in setting of gallstone pancreatitis 05/2011:  LHC with 3v CAD; CABG was performed 06/14/11: RIMA-LAD, SVG-ramus, SVG-RCA  . Ischemic cardiomyopathy     Echocardiogram 06/05/11: EF 40-45%, anteroseptal hypokinesis, apical hypokinesis, mild LAE  . Chronic bronchitis   . Anemia   . H/O hiatal hernia   . Chronic systolic heart failure   . Carotid stenosis     Dopplers 06/12/11: LICA 60-79%.    Scheduled medications:     . allopurinol  200 mg Oral Daily  . aspirin  81 mg Oral NOW   Or  . aspirin  300 mg Rectal NOW  . aspirin  325 mg Oral Daily  . gabapentin  300 mg Oral BID  . heparin  4,000 Units Intravenous Once  . magnesium sulfate LVP 250-500 ml  3 g Intravenous To Major  . pantoprazole  40 mg Oral Q1200  . simvastatin  20 mg Oral q1800  . sodium chloride  500 mL Intravenous Once     Assessment:  71 y.o. female admitted on 08/12/2011, with pulmonary embolism. Pharmacy consulted to manage  IV heparin. Patient started on IV heparin at 1150 units/hr after 4000 unit bolus at ~ 1930 on Sunday.   Goal of Therapy:  1. Heparin level 0.3-0.7 units/ml  Plan:  1. Continue IV heparin at 1150 units/hr.  2. Obtain heparin level with AM labs today. 3. Daily CBC, heparin level.   Dineen Kid Anyelin Mogle, PharmD 08/13/2011, 3:06 AM

## 2011-08-13 NOTE — Interval H&P Note (Cosign Needed)
History and Physical Interval Note: Hx of recent CABG (1/13)  - new onset of worsening SOB - PE and bilateral DVT's noted - in need of urgent IVC filter.   08/13/2011 1:30 PM  [name]  has presented today for surgery, with the diagnosis of * pulmonary embolism and bilateral deep vein thromboses. *  The various methods of treatment have been discussed with the patient and spouse. After consideration of risks, benefits and other options for treatment, the patient has consented to  *inferior vena cava filter placement* as a surgical intervention along with heparin.  The patients' history has been reviewed, patient examined, no change in status, stable for surgery.  I have reviewed the patients' chart and labs.  Questions were answered to the patient's satisfaction.  Spouse present for our conversation, all his questions answered to his satisfaction.  He was made aware this may be a permanent filter and that it would only protect her from further PE from the DVT's. Given her respiratory compromise - do not feel comfortable with moderate sedation usage for procedure - elected to use morphine which she has tolerated on call to IR prior to placement. Patient and spouse in agreement.   [name] D

## 2011-08-13 NOTE — H&P (Signed)
Patient name: [name] Medical record number: [id number] Date of birth: 11-09-1940 Age: 71 y.o. Gender: female PCP: Lolita Patella, MD, MD  PCCM ADMISSION NOTE  Date: 08/12/2011 History of Present Illness: [name] is a 71 year old lady who underwent a coronary bypass grafting in January, who presented today with right-sided pleuritic chest pain and dyspnea. She underwent CT scanning which showed a large saddle embolus. She was started on a heparin drip and admitted to the ICU for further evaluation. BNP and troponin were negative in ED. Echocardiogram showed right heart strain and a possible mass in the left atrium.  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  Lines/tubes :   Microbiology/Sepsis markers: None   Anti-infectives    None      Protocols:  None  Consults:      Studies: Ct Angio Chest W/cm &/or Wo Cm  08/12/2011  **ADDENDUM** CREATED: 08/12/2011 19:28:02  Critical Value/emergent results were called by telephone at the time of interpretation on 08/12/2011  at 1920 hours  to  Dr. Lendell Caprice, who verbally acknowledged these results.  **END ADDENDUM** SIGNED BY: Marlowe Aschoff. Hoss, M.D.    08/12/2011  *RADIOLOGY REPORT*  Clinical Data: Short of breath and chest pain  CT ANGIOGRAPHY CHEST  Technique:  Multidetector CT imaging of the chest using the standard protocol during bolus administration of intravenous contrast. Multiplanar reconstructed images including MIPs were obtained and reviewed to evaluate the vascular anatomy.  Contrast: 80mL OMNIPAQUE IOHEXOL 350 MG/ML IV SOLN  Comparison: 06/12/2011  Findings: A saddle embolus is seen extending across the bifurcation of the main pulmonary artery into both left and right pulmonary artery branches as well as lobe are branches.  This is consistent with extensive pulmonary thromboembolism.  The right ventricle is  somewhat dilated with respect to that of the left ventricle consistent with an increased mortality rate of this condition.  Dextrocardia with situs inversus is again noted.  Small mediastinal nodes.  No pericardial effusion.  Small right pleural effusion.  Bibasilar atelectasis. Right basilar bronchiectasis is noted.  Small hiatal hernia.  Small gallstone is suspected.  IMPRESSION: Extensive pulmonary thromboembolism with dilatation of the right ventricle.  Situs inversus.  Bibasilar atelectasis.  Small platter pleural effusion.  Cholelithiasis is suspected.  Original Report Authenticated By: Donavan Burnet, M.D.   Dg Chest Portable 1 View  08/12/2011  *RADIOLOGY REPORT*  Clinical Data: Shortness of breath.  PORTABLE CHEST - 1 VIEW  Comparison: 07/17/2011.  Findings: Situs inversus is again noted.  There are surgical changes from bypass surgery.  There is a small right-sided pleural effusion with overlying atelectasis.  Minimal left basilar atelectasis also.  No edema or pneumothorax.  IMPRESSION: Small right effusion and bibasilar atelectasis.  Original Report Authenticated By: P. Loralie Champagne, M.D.     Events:   Past Medical History  Diagnosis Date  . Arthritis   . Neuropathy   . GERD (gastroesophageal reflux disease)   . Hypertension   . HLD (hyperlipidemia)   . Situs inversus with dextrocardia     CT 06/2011: situs inversus totalis.  What would typically be called RCA arose from anterior sinus of Valsalva.  What would typically be called the left main arises from the posterior sinus of Valsalva and gives rise to a large first diagonal branch and diminutive circumflex  . Dextrocardia   . Fracture 05/24/2011    right; "did not  have surgery"  . DVT of leg (deep venous thrombosis) ~2006    left  . Pneumonia 06/13/11    "couple times; long time ago"  . CAD (coronary artery disease)     NSTEMI in setting of gallstone pancreatitis 05/2011:  LHC with 3v CAD; CABG was performed 06/14/11:  RIMA-LAD, SVG-ramus, SVG-RCA  . Ischemic cardiomyopathy     Echocardiogram 06/05/11: EF 40-45%, anteroseptal hypokinesis, apical hypokinesis, mild LAE  . Chronic bronchitis   . Anemia   . H/O hiatal hernia   . Chronic systolic heart failure   . Carotid stenosis     Dopplers 06/12/11: LICA 60-79%.    Past Surgical History  Procedure Date  . Ercp 06/03/2011    Procedure: ENDOSCOPIC RETROGRADE CHOLANGIOPANCREATOGRAPHY (ERCP);  Surgeon: Petra Kuba, MD;  Location: [facility] OR;  Service: Endoscopy;  Laterality: N/A;  . Vaginal hysterectomy 1970's  . Cardiac catheterization 06/11/11  . Coronary artery bypass graft 06/14/2011    Procedure: CORONARY ARTERY BYPASS GRAFTING (CABG);  Surgeon: Alleen Borne, MD;  Location: [facility] OR;  Service: Open Heart Surgery;  Laterality: N/A;    History reviewed. No pertinent family history.  Social History:  reports that she has never smoked. She has never used smokeless tobacco. She reports that she does not drink alcohol or use illicit drugs.  Allergies:  Allergies  Allergen Reactions  . Penicillins Anaphylaxis    Medications:  Prior to Admission medications   Medication Sig Start Date End Date Taking? Authorizing Provider  allopurinol (ZYLOPRIM) 100 MG tablet Take 200 mg by mouth daily.     Yes Historical Provider, MD  aspirin 325 MG tablet Take 325 mg by mouth daily.     Yes Historical Provider, MD  furosemide (LASIX) 20 MG tablet Take 20 mg by mouth daily. . 08/07/11  Yes Historical Provider, MD  gabapentin (NEURONTIN) 300 MG capsule Take 300 mg by mouth 2 (two) times daily.     Yes Historical Provider, MD  metFORMIN (GLUCOPHAGE) 850 MG tablet Take 850 mg by mouth 2 (two) times daily with a meal. 06/25/11 06/24/12 Yes Wayne E Gold, PA  metoprolol (LOPRESSOR) 50 MG tablet Take 50 mg by mouth 2 (two) times daily. 07/10/11 07/09/12 Yes Beatrice Lecher, PA  omeprazole (PRILOSEC) 20 MG capsule Take 20 mg by mouth every morning.     Yes Historical Provider, MD  simvastatin  (ZOCOR) 40 MG tablet Take 20 mg by mouth at bedtime. 06/25/11  Yes Rowe Clack, PA  traMADol (ULTRAM) 50 MG tablet Take 50 mg by mouth every 6 (six) hours as needed. For pain 06/26/11  Yes Historical Provider, MD    A comprehensive review of systems was negative.  Vital signs for last 24 hours: Temp:  [97.5 F (36.4 C)] 97.5 F (36.4 C) (03/10 1804) Pulse Rate:  [130-138] 130  (03/10 2345) Resp:  [16-31] 26  (03/10 2345) BP: (94-118)/(39-81) 94/72 mmHg (03/10 2330) SpO2:  [90 %-100 %] 99 % (03/10 2345) Weight:  [86.9 kg (191 lb 9.3 oz)] 86.9 kg (191 lb 9.3 oz) (03/10 2245)  Intake/Output this shift: Total I/O In: 33.4 [I.V.:33.4] Out: -   Vent settings for last 24 hours:    Physical Exam:  General: No apparent distress. Eyes: Anicteric sclerae. ENT: Oropharynx clear. Moist mucous membranes. No thrush Lymph: No cervical, supraclavicular, or axillary lymphadenopathy. Heart: Normal S1, S2. No murmurs, rubs, or gallops appreciated. No bruits, equal pulses. Lungs: Diminished in the right base with crackles. Abdomen: Abdomen soft,  non-tender and not distended, normoactive bowel sounds. No hepatosplenomegaly or masses. Musculoskeletal: No clubbing or synovitis. Skin: No rashes or lesions Neuro: No focal neurologic deficits.  LAB RESULT Results for orders placed during the hospital encounter of 08/12/11 (from the past 24 hour(s))  CBC     Status: Abnormal   Collection Time   08/12/11  5:43 PM      Component Value Range   WBC 18.2 (*) 4.0 - 10.5 (K/uL)   RBC 4.30  3.87 - 5.11 (MIL/uL)   Hemoglobin 12.5  12.0 - 15.0 (g/dL)   HCT 16.1  09.6 - 04.5 (%)   MCV 88.6  78.0 - 100.0 (fL)   MCH 29.1  26.0 - 34.0 (pg)   MCHC 32.8  30.0 - 36.0 (g/dL)   RDW 40.9 (*) 81.1 - 15.5 (%)   Platelets 300  150 - 400 (K/uL)  DIFFERENTIAL     Status: Abnormal   Collection Time   08/12/11  5:43 PM      Component Value Range   Neutrophils Relative 80 (*) 43 - 77 (%)   Neutro Abs 14.5 (*) 1.7 - 7.7  (K/uL)   Lymphocytes Relative 12  12 - 46 (%)   Lymphs Abs 2.2  0.7 - 4.0 (K/uL)   Monocytes Relative 8  3 - 12 (%)   Monocytes Absolute 1.4 (*) 0.1 - 1.0 (K/uL)   Eosinophils Relative 1  0 - 5 (%)   Eosinophils Absolute 0.1  0.0 - 0.7 (K/uL)   Basophils Relative 0  0 - 1 (%)   Basophils Absolute 0.1  0.0 - 0.1 (K/uL)  PRO B NATRIURETIC PEPTIDE     Status: Abnormal   Collection Time   08/12/11  5:43 PM      Component Value Range   Pro B Natriuretic peptide (BNP) 528.0 (*) 0 - 125 (pg/mL)  CARDIAC PANEL(CRET KIN+CKTOT+MB+TROPI)     Status: Normal   Collection Time   08/12/11  5:44 PM      Component Value Range   Total CK 28  7 - 177 (U/L)   CK, MB 1.0  0.3 - 4.0 (ng/mL)   Troponin I <0.30  <0.30 (ng/mL)   Relative Index RELATIVE INDEX IS INVALID  0.0 - 2.5   COMPREHENSIVE METABOLIC PANEL     Status: Abnormal   Collection Time   08/12/11  5:50 PM      Component Value Range   Sodium 134 (*) 135 - 145 (mEq/L)   Potassium 3.9  3.5 - 5.1 (mEq/L)   Chloride 96  96 - 112 (mEq/L)   CO2 24  19 - 32 (mEq/L)   Glucose, Bld 197 (*) 70 - 99 (mg/dL)   BUN 29 (*) 6 - 23 (mg/dL)   Creatinine, Ser 9.14 (*) 0.50 - 1.10 (mg/dL)   Calcium 8.5  8.4 - 78.2 (mg/dL)   Total Protein 6.6  6.0 - 8.3 (g/dL)   Albumin 2.6 (*) 3.5 - 5.2 (g/dL)   AST 18  0 - 37 (U/L)   ALT 11  0 - 35 (U/L)   Alkaline Phosphatase 135 (*) 39 - 117 (U/L)   Total Bilirubin 0.7  0.3 - 1.2 (mg/dL)   GFR calc non Af Amer 47 (*) >90 (mL/min)   GFR calc Af Amer 55 (*) >90 (mL/min)  PROTIME-INR     Status: Normal   Collection Time   08/12/11  5:50 PM      Component Value Range   Prothrombin Time 15.2  11.6 - 15.2 (seconds)   INR 1.18  0.00 - 1.49   APTT     Status: Normal   Collection Time   08/12/11  5:50 PM      Component Value Range   aPTT 27  24 - 37 (seconds)  POCT I-STAT, CHEM 8     Status: Abnormal   Collection Time   08/12/11  6:04 PM      Component Value Range   Sodium 135  135 - 145 (mEq/L)   Potassium 4.0  3.5 -  5.1 (mEq/L)   Chloride 100  96 - 112 (mEq/L)   BUN 30 (*) 6 - 23 (mg/dL)   Creatinine, Ser 1.61 (*) 0.50 - 1.10 (mg/dL)   Glucose, Bld 096 (*) 70 - 99 (mg/dL)   Calcium, Ion 0.45 (*) 1.12 - 1.32 (mmol/L)   TCO2 25  0 - 100 (mmol/L)   Hemoglobin 13.6  12.0 - 15.0 (g/dL)   HCT 40.9  81.1 - 91.4 (%)  LACTIC ACID, PLASMA     Status: Normal   Collection Time   08/12/11  8:10 PM      Component Value Range   Lactic Acid, Venous 1.5  0.5 - 2.2 (mmol/L)  MAGNESIUM     Status: Abnormal   Collection Time   08/12/11  8:10 PM      Component Value Range   Magnesium 0.9 (*) 1.5 - 2.5 (mg/dL)  PHOSPHORUS     Status: Normal   Collection Time   08/12/11  8:10 PM      Component Value Range   Phosphorus 2.8  2.3 - 4.6 (mg/dL)  PROCALCITONIN     Status: Normal   Collection Time   08/12/11  8:14 PM      Component Value Range   Procalcitonin 0.11    URINALYSIS, ROUTINE W REFLEX MICROSCOPIC     Status: Abnormal   Collection Time   08/12/11  8:28 PM      Component Value Range   Color, Urine YELLOW  YELLOW    APPearance CLEAR  CLEAR    Specific Gravity, Urine >1.046 (*) 1.005 - 1.030    pH 5.5  5.0 - 8.0    Glucose, UA NEGATIVE  NEGATIVE (mg/dL)   Hgb urine dipstick NEGATIVE  NEGATIVE    Bilirubin Urine NEGATIVE  NEGATIVE    Ketones, ur 15 (*) NEGATIVE (mg/dL)   Protein, ur NEGATIVE  NEGATIVE (mg/dL)   Urobilinogen, UA 1.0  0.0 - 1.0 (mg/dL)   Nitrite NEGATIVE  NEGATIVE    Leukocytes, UA NEGATIVE  NEGATIVE      Assessment/Plan Active Problems:  Acute respiratory failure with hypoxia  Saddle embolism of pulmonary artery  Pericardial effusion  Atrial mass  Pulmonary Embolism (radiographically demonstrated, spiral CT and without hemodynamic compromise) Bronchiectasis --Currently patient does not meet criteria for thrombolysis. Dopplers of the lower extremity are pending. She is already on a heparin drip. I suspect she will improve rapidly from this perspective. We will try some CPAP to see if  this can aid with her dyspnea but if she does not tolerate it then we will simply continue with a partial nonrebreather. Her current level of oxygenation is acceptable. 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 Once she is recovered from her acute illness it may be reasonable to refer her to [facility] for evaluation of this.   LOS: 1 day   Best practices / Disposition: -->Step-down status under PCCM -->DNR -->Heparin (treatment) for DVT/PE -->diet  The patient is critically ill with multiple organ systems failure and requires high complexity decision making for assessment and support, frequent evaluation and titration of therapies, application of advanced monitoring technologies and extensive interpretation of multiple databases.   Critical Care Time devoted to patient care services described in this note is: 9 Minutes  Lavella Hammock, M.D. Pulmonary and Critical Care Medicine Call E-link with questions [phone]  08/12/2011

## 2011-08-13 NOTE — Procedures (Signed)
IVCgram: situs inversus, otherwise unremarkable Infrarenal IVC filter placed. No complication No blood loss. See complete dictation in [facility].

## 2011-08-13 NOTE — H&P (View-Only) (Signed)
Patient name: [name] Medical record number: [id number] Date of birth: 01/15/1941 Age: 71 y.o. Gender: female PCP: Lolita Patella, MD, MD  PCCM ADMISSION NOTE  Date: 08/12/2011 History of Present Illness: [name] is a 71 year old lady who underwent a coronary bypass grafting in January, who presented today with right-sided pleuritic chest pain and dyspnea. She underwent CT scanning which showed a large saddle embolus. She was started on a heparin drip and admitted to the ICU for further evaluation. BNP and troponin were negative in ED. Echocardiogram showed right heart strain and a possible mass in the left atrium.  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  Lines/tubes :   Microbiology/Sepsis markers: None   Anti-infectives    None      Protocols:  None  Consults:      Studies: Ct Angio Chest W/cm &/or Wo Cm  08/12/2011  **ADDENDUM** CREATED: 08/12/2011 19:28:02  Critical Value/emergent results were called by telephone at the time of interpretation on 08/12/2011  at 1920 hours  to  Dr. Lendell Caprice, who verbally acknowledged these results.  **END ADDENDUM** SIGNED BY: Marlowe Aschoff. Hoss, M.D.    08/12/2011  *RADIOLOGY REPORT*  Clinical Data: Short of breath and chest pain  CT ANGIOGRAPHY CHEST  Technique:  Multidetector CT imaging of the chest using the standard protocol during bolus administration of intravenous contrast. Multiplanar reconstructed images including MIPs were obtained and reviewed to evaluate the vascular anatomy.  Contrast: 80mL OMNIPAQUE IOHEXOL 350 MG/ML IV SOLN  Comparison: 06/12/2011  Findings: A saddle embolus is seen extending across the bifurcation of the main pulmonary artery into both left and right pulmonary artery branches as well as lobe are branches.  This is consistent with extensive pulmonary thromboembolism.  The right ventricle is  somewhat dilated with respect to that of the left ventricle consistent with an increased mortality rate of this condition.  Dextrocardia with situs inversus is again noted.  Small mediastinal nodes.  No pericardial effusion.  Small right pleural effusion.  Bibasilar atelectasis. Right basilar bronchiectasis is noted.  Small hiatal hernia.  Small gallstone is suspected.  IMPRESSION: Extensive pulmonary thromboembolism with dilatation of the right ventricle.  Situs inversus.  Bibasilar atelectasis.  Small platter pleural effusion.  Cholelithiasis is suspected.  Original Report Authenticated By: Donavan Burnet, M.D.   Dg Chest Portable 1 View  08/12/2011  *RADIOLOGY REPORT*  Clinical Data: Shortness of breath.  PORTABLE CHEST - 1 VIEW  Comparison: 07/17/2011.  Findings: Situs inversus is again noted.  There are surgical changes from bypass surgery.  There is a small right-sided pleural effusion with overlying atelectasis.  Minimal left basilar atelectasis also.  No edema or pneumothorax.  IMPRESSION: Small right effusion and bibasilar atelectasis.  Original Report Authenticated By: P. Loralie Champagne, M.D.     Events: LE dopplers 3/11 >> prelim positive for B LE DVT >>    Subjective: Nurse has seen and family describes some aspiration sx   Vital signs for last 24 hours: Temp:  [97.5 F (36.4 C)-101 F (38.3 C)] 98.2 F (36.8 C) (03/11 0731) Pulse Rate:  [112-138] 115  (03/11 1000) Resp:  [16-31] 21  (03/11 1000) BP: (94-118)/(39-81) 98/43 mmHg (03/11 1000) SpO2:  [90 %-100 %] 95 % (03/11 1000) FiO2 (%):  [40 %-50 %] 50 % (03/11 0731) Weight:  [86.9 kg (191 lb 9.3 oz)] 86.9 kg (191 lb 9.3 oz) (03/10  2245)  Intake/Output this shift: Total I/O In: 23 [I.V.:23] Out: 300 [Urine:300]  Vent settings for last 24 hours: Vent Mode:  [-]  FiO2 (%):  [40 %-50 %] 50 %  Physical Exam:  General: No apparent distress. Eyes: Anicteric sclerae. ENT: Oropharynx clear. Moist mucous membranes. No  thrush Lymph: No cervical, supraclavicular, or axillary lymphadenopathy. Heart: Normal S1, S2. No murmurs, rubs, or gallops appreciated. No bruits, equal pulses. Lungs: Diminished in the right base with crackles. Abdomen: Abdomen soft, non-tender and not distended, normoactive bowel sounds. No hepatosplenomegaly or masses. Musculoskeletal: No clubbing or synovitis. Skin: No rashes or lesions Neuro: No focal neurologic deficits.  BMET    Component Value Date/Time   NA 136 08/13/2011 0915   K 3.9 08/13/2011 0915   CL 99 08/13/2011 0915   CO2 24 08/13/2011 0915   GLUCOSE 171* 08/13/2011 0915   BUN 27* 08/13/2011 0915   CREATININE 1.07 08/13/2011 0915   CALCIUM 8.5 08/13/2011 0915   GFRNONAA 52* 08/13/2011 0915   GFRAA 60* 08/13/2011 0915   CBC    Component Value Date/Time   WBC 21.3* 08/13/2011 0915   RBC 4.07 08/13/2011 0915   HGB 11.7* 08/13/2011 0915   HCT 36.3 08/13/2011 0915   PLT 293 08/13/2011 0915   MCV 89.2 08/13/2011 0915   MCH 28.7 08/13/2011 0915   MCHC 32.2 08/13/2011 0915   RDW 16.2* 08/13/2011 0915   LYMPHSABS 2.2 08/12/2011 1743   MONOABS 1.4* 08/12/2011 1743   EOSABS 0.1 08/12/2011 1743   BASOSABS 0.1 08/12/2011 1743    Assessment/Plan Active Problems:  Acute respiratory failure with hypoxia  Saddle embolism of pulmonary artery  Pericardial effusion  Atrial mass  Pulmonary Embolism (radiographically demonstrated, spiral CT and without hemodynamic compromise) Bronchiectasis -- Dopplers of the lower extremity done this am, official read pending but appears to have B LE DVT  -- heparin drip. -- currently on 60% FiO2, hemodynamically stable; indications for lytics are narrow, would defer at this time.  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 Once  she is recovered from her acute illness it may be reasonable to refer her to [facility] for evaluation of this.  Dysphagia: -- will ask for swallowing eval   LOS: 1 day   Best practices / Disposition: -->Step-down status under PCCM -->DNR -->Heparin (treatment) for DVT/PE -->diet   Levy Pupa, MD, PhD 08/13/2011, 11:29 AM [place] [facility] [phone] or if no answer [phone]

## 2011-08-13 NOTE — Progress Notes (Signed)
VASCULAR LAB PRELIMINARY  PRELIMINARY  PRELIMINARY  PRELIMINARY  Bilateral lower extremity venous duplex completed.    Preliminary report:  There is subacute DVT noted throughout the right lower extremity, including the posterior tibial, popliteal, femoral and common femoral veins.  There is acute, occlusive DVT noted in the posterior tibial vein of the left lower extremity.   Sherren Kerns [place], 08/13/2011, 8:04 AM

## 2011-08-13 NOTE — Progress Notes (Signed)
ANTICOAGULATION CONSULT NOTE - Follow Up Consult  Pharmacy Consult for Heparin Indication: pulmonary embolus and DVT  Allergies  Allergen Reactions  . Penicillins Anaphylaxis    Patient Measurements: Height: 5\' 6"  (167.6 cm) Weight: 191 lb 9.3 oz (86.9 kg) IBW/kg (Calculated) : 59.3  Heparin Dosing Weight: 78 kg  Vital Signs: Temp: 98.2 F (36.8 C) (03/11 0731) Temp src: Oral (03/11 0731) BP: 98/43 mmHg (03/11 1000) Pulse Rate: 115  (03/11 1000)  Labs:  Basename 08/13/11 0915 08/12/11 2330 08/12/11 1804 08/12/11 1750 08/12/11 1744 08/12/11 1743  HGB 11.7* -- 13.6 -- -- --  HCT 36.3 -- 40.0 -- -- 38.1  PLT 293 -- -- -- -- 300  APTT -- -- -- 27 -- --  LABPROT -- -- -- 15.2 -- --  INR -- -- -- 1.18 -- --  HEPARINUNFRC 0.38 -- -- -- -- --  CREATININE -- -- 1.30* 1.15* -- --  CKTOTAL -- 20 -- -- 28 --  CKMB -- 0.9 -- -- 1.0 --  TROPONINI -- <0.30 -- -- <0.30 --   Estimated Creatinine Clearance: 45.3 ml/min (by C-G formula based on Cr of 1.3).   Medications:  Prescriptions prior to admission  Medication Sig Dispense Refill  . allopurinol (ZYLOPRIM) 100 MG tablet Take 200 mg by mouth daily.        Marland Kitchen aspirin 325 MG tablet Take 325 mg by mouth daily.        . furosemide (LASIX) 20 MG tablet Take 20 mg by mouth daily. .      . gabapentin (NEURONTIN) 300 MG capsule Take 300 mg by mouth 2 (two) times daily.        . metFORMIN (GLUCOPHAGE) 850 MG tablet Take 850 mg by mouth 2 (two) times daily with a meal.      . metoprolol (LOPRESSOR) 50 MG tablet Take 50 mg by mouth 2 (two) times daily.      Marland Kitchen omeprazole (PRILOSEC) 20 MG capsule Take 20 mg by mouth every morning.        . simvastatin (ZOCOR) 40 MG tablet Take 20 mg by mouth at bedtime.      . traMADol (ULTRAM) 50 MG tablet Take 50 mg by mouth every 6 (six) hours as needed. For pain       Scheduled:    . allopurinol  200 mg Oral Daily  . aspirin  81 mg Oral NOW   Or  . aspirin  300 mg Rectal NOW  . aspirin  325 mg Oral  Daily  . Chlorhexidine Gluconate Cloth  6 each Topical Q0600  . gabapentin  300 mg Oral BID  . heparin  4,000 Units Intravenous Once  . magnesium sulfate LVP 250-500 ml  3 g Intravenous To Major  . mupirocin ointment  1 application Nasal BID  . pantoprazole  40 mg Oral Q1200  . simvastatin  20 mg Oral q1800  . sodium chloride  500 mL Intravenous Once  . DISCONTD: Chlorhexidine Gluconate Cloth  6 each Topical Q0600   Infusions:    . heparin 1,150 Units/hr (08/12/11 2200)  . DISCONTD: heparin      Assessment: 71 yo F on heparin for new saddle PE.  Dopplers completed also show new LLE DVT.  Heparin at lower end of therapeutic goal at 1150 units/hr.  Goal of Therapy:  Heparin level 0.3-0.7 units/ml   Plan:  Increase heparin to 1250 units/hr to maintain higher end of goal range considering large clot burden. Check heparin level  6 hours after rate increase. Follow up plans for initiating oral anticoagulant therapy.  [employer], Pharm.D., BCPS Clinical Pharmacist Pager [phone]  08/13/2011,10:56 AM

## 2011-08-13 NOTE — Progress Notes (Signed)
Speech Language/Pathology SLP Cancellation Note  Treatment cancelled today due to patient receiving procedure or test.  Per RN, patient going for IVC filter this pm. RN requested that SLP f/u first thing in am 3/12.   Ferdinand Lango MA, CCC-SLP [phone]   [name] 08/13/2011, 1:44 PM

## 2011-08-13 NOTE — Progress Notes (Signed)
Pt. Refuses CPAP at this time. RN is aware. Pt. Stated that she would try CPAP tomorrow night.

## 2011-08-14 ENCOUNTER — Other Ambulatory Visit: Payer: Self-pay

## 2011-08-14 ENCOUNTER — Telehealth (HOSPITAL_COMMUNITY): Payer: Self-pay | Admitting: Radiology

## 2011-08-14 LAB — BASIC METABOLIC PANEL
BUN: 23 mg/dL (ref 6–23)
CO2: 23 mEq/L (ref 19–32)
Chloride: 101 mEq/L (ref 96–112)
Creatinine, Ser: 0.95 mg/dL (ref 0.50–1.10)
Glucose, Bld: 152 mg/dL — ABNORMAL HIGH (ref 70–99)

## 2011-08-14 LAB — PROTIME-INR: Prothrombin Time: 16.6 seconds — ABNORMAL HIGH (ref 11.6–15.2)

## 2011-08-14 LAB — GLUCOSE, CAPILLARY
Glucose-Capillary: 151 mg/dL — ABNORMAL HIGH (ref 70–99)
Glucose-Capillary: 139 mg/dL — ABNORMAL HIGH (ref 70–99)

## 2011-08-14 LAB — CBC
HCT: 35.2 % — ABNORMAL LOW (ref 36.0–46.0)
Hemoglobin: 11.2 g/dL — ABNORMAL LOW (ref 12.0–15.0)
MCV: 90.5 fL (ref 78.0–100.0)
RBC: 3.89 MIL/uL (ref 3.87–5.11)
WBC: 17.2 10*3/uL — ABNORMAL HIGH (ref 4.0–10.5)

## 2011-08-14 MED ORDER — COUMADIN BOOK
Freq: Once | Status: AC
  Administered 2011-08-14: 10:00:00
  Filled 2011-08-14: qty 1

## 2011-08-14 MED ORDER — GUAIFENESIN 100 MG/5ML PO SOLN
5.0000 mL | Freq: Three times a day (TID) | ORAL | Status: DC
  Administered 2011-08-14 – 2011-08-15 (×4): 200 mg via ORAL
  Administered 2011-08-16: 100 mg via ORAL
  Administered 2011-08-16 – 2011-08-20 (×14): 200 mg via ORAL
  Administered 2011-08-21 (×3): 100 mg via ORAL
  Administered 2011-08-22 (×3): 200 mg via ORAL
  Administered 2011-08-23: 100 mg via ORAL
  Administered 2011-08-23: 200 mg via ORAL
  Administered 2011-08-23: 100 mg via ORAL
  Administered 2011-08-24: 200 mg via ORAL
  Filled 2011-08-14 (×33): qty 10

## 2011-08-14 MED ORDER — WARFARIN - PHARMACIST DOSING INPATIENT
Freq: Every day | Status: DC
  Administered 2011-08-14 – 2011-08-23 (×5)

## 2011-08-14 MED ORDER — WARFARIN SODIUM 2.5 MG PO TABS
2.5000 mg | ORAL_TABLET | Freq: Once | ORAL | Status: AC
  Administered 2011-08-14: 2.5 mg via ORAL
  Filled 2011-08-14: qty 1

## 2011-08-14 MED ORDER — GUAIFENESIN 100 MG/5ML PO SOLN
5.0000 mL | Freq: Three times a day (TID) | ORAL | Status: DC
  Filled 2011-08-14: qty 10

## 2011-08-14 MED ORDER — INSULIN ASPART 100 UNIT/ML ~~LOC~~ SOLN
0.0000 [IU] | SUBCUTANEOUS | Status: DC
  Administered 2011-08-14: 3 [IU] via SUBCUTANEOUS

## 2011-08-14 MED ORDER — WARFARIN VIDEO
Freq: Once | Status: AC
  Administered 2011-08-14: 10:00:00

## 2011-08-14 NOTE — Evaluation (Signed)
Clinical/Bedside Swallow Evaluation Patient Details  Name: [name] MRN: [id number] DOB: 29-Apr-1941 Today's Date: 08/14/2011  Past Medical History:  Past Medical History  Diagnosis Date  . Arthritis   . Neuropathy   . GERD (gastroesophageal reflux disease)   . Hypertension   . HLD (hyperlipidemia)   . Situs inversus with dextrocardia     CT 06/2011: situs inversus totalis.  What would typically be called RCA arose from anterior sinus of Valsalva.  What would typically be called the left main arises from the posterior sinus of Valsalva and gives rise to a large first diagonal branch and diminutive circumflex  . Dextrocardia   . Fracture 05/24/2011    right; "did not have surgery"  . DVT of leg (deep venous thrombosis) ~2006    left  . Pneumonia 06/13/11    "couple times; long time ago"  . CAD (coronary artery disease)     NSTEMI in setting of gallstone pancreatitis 05/2011:  LHC with 3v CAD; CABG was performed 06/14/11: RIMA-LAD, SVG-ramus, SVG-RCA  . Ischemic cardiomyopathy     Echocardiogram 06/05/11: EF 40-45%, anteroseptal hypokinesis, apical hypokinesis, mild LAE  . Chronic bronchitis   . Anemia   . H/O hiatal hernia   . Chronic systolic heart failure   . Carotid stenosis     Dopplers 06/12/11: LICA 60-79%.   Past Surgical History:  Past Surgical History  Procedure Date  . Ercp 06/03/2011    Procedure: ENDOSCOPIC RETROGRADE CHOLANGIOPANCREATOGRAPHY (ERCP);  Surgeon: Petra Kuba, MD;  Location: [facility] OR;  Service: Endoscopy;  Laterality: N/A;  . Vaginal hysterectomy 1970's  . Cardiac catheterization 06/11/11  . Coronary artery bypass graft 06/14/2011    Procedure: CORONARY ARTERY BYPASS GRAFTING (CABG);  Surgeon: Alleen Borne, MD;  Location: [facility] OR;  Service: Open Heart Surgery;  Laterality: N/A;   HPI:  [name] is a 71 year old lady who underwent a coronary bypass grafting in January, who presented today with right-sided pleuritic chest pain and dyspnea. She underwent  CT scanning which showed a large saddle embolus. She was started on a heparin drip and admitted to the ICU for further evaluation. BNP and troponin were negative in ED. Echocardiogram showed right heart strain and a possible mass in the left atrium. Patient also with a h/o situs inversus totalis, multiple PNAs, chronic lung disease and bonchiectasis on CT.   Assessment/Recommendations/Treatment Plan Evaluation limited secondary to high O2 needs. RN attemptd to wean to nasal cannula from non-rebreather mask without success, noting immediate and significant drop in O2 saturations. Patient also with significant drop on O2 when venti-mask removed for more than 3-4 seconds. Minimal po trials provided to assess ability to continue to safely take po meds which are currently being crushed and provided in puree. Patient presents at bedside with what appears to be a functional oropharyngeal swallow however suspect primary esophageal dysphagia characterized by c/o severe and immediate globus post swallow, c/o potential regurgitation, and pain with swallow. Following bite of pureed solids with sip of room temperature/warm liquids appeared to decrease complaints but did not eliminate them. Intermittent dry cough noted however did not worsen from baseline once pos were provided. Aspiration risk also may be exacerbated at this time given high oxygen requirements. At this time, recommend po meds, crushed in puree, only as long as patient able to maintain O2 saturation levels for brief mask removal. Otherwise keep NPO. When patient able to better maintain oxygen levels, recommend MBS to objectively evaluate swallowing function. Note that patient  has been complaining of swallowing difficulty which is worsening since CABG.  Risk for Aspiration: Moderate Other Related Risk Factors: History of GERD  Swallow Evaluation Recommendations Recommended Consults: MBS (when able to maintain O2 saturation levels) General Recommendation:  NPO except meds (meds crushed in puree, may follow with small sip of H2O) Medication Administration: Crushed with puree Supervision: Full supervision/cueing for compensatory strategies Compensations: Slow rate;Small sips/bites;Follow solids with liquid Postural Changes and/or Swallow Maneuvers: Seated upright 90 degrees;Upright 30-60 min after meal   Treatment Plan Treatment Plan Recommendations: Therapy as outlined in treatment plan below Speech Therapy Frequency: min 2x/week Treatment Duration: 2 weeks Interventions: Aspiration precaution training;Compensatory techniques;Patient/family education;Diet toleration management by SLP   Swallowing Goals  SLP Swallowing Goals Goal #3: Patient will maintain O2 levels above 90% for 30 minutes to determine ability to participate in objective swallowing evaluation.  Swallow Study Goal #3 - Progress: Not met  Alyssa Lango MA, CCC-SLP [phone]  [name] [name] 08/14/2011,11:48 AM

## 2011-08-14 NOTE — Progress Notes (Signed)
Patient name: [name] Medical record number: [id number] Date of birth: 1940/11/15 Age: 71 y.o. Gender: female PCP: Lolita Patella, MD, MD  PCCM ADMISSION NOTE  Date: 08/12/2011 History of Present Illness: [name] is a 71 year old lady who underwent a coronary bypass grafting in January, who presented today with right-sided pleuritic chest pain and dyspnea. She underwent CT scanning which showed a large saddle embolus. She was started on a heparin drip and admitted to the ICU for further evaluation. BNP and troponin were negative in ED. Echocardiogram showed right heart strain and a possible mass in the left atrium.  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  Lines/tubes :   Microbiology/Sepsis markers: None   Anti-infectives    None      Protocols:  None  Consults:      Studies: Ct Angio Chest W/cm &/or Wo Cm  08/12/2011  **ADDENDUM** CREATED: 08/12/2011 19:28:02  Critical Value/emergent results were called by telephone at the time of interpretation on 08/12/2011  at 1920 hours  to  Dr. Lendell Caprice, who verbally acknowledged these results.  **END ADDENDUM** SIGNED BY: Marlowe Aschoff. Hoss, M.D.    08/12/2011  *RADIOLOGY REPORT*  Clinical Data: Short of breath and chest pain  CT ANGIOGRAPHY CHEST  Technique:  Multidetector CT imaging of the chest using the standard protocol during bolus administration of intravenous contrast. Multiplanar reconstructed images including MIPs were obtained and reviewed to evaluate the vascular anatomy.  Contrast: 80mL OMNIPAQUE IOHEXOL 350 MG/ML IV SOLN  Comparison: 06/12/2011  Findings: A saddle embolus is seen extending across the bifurcation of the main pulmonary artery into both left and right pulmonary artery branches as well as lobe are branches.  This is consistent with extensive pulmonary thromboembolism.  The right ventricle is  somewhat dilated with respect to that of the left ventricle consistent with an increased mortality rate of this condition.  Dextrocardia with situs inversus is again noted.  Small mediastinal nodes.  No pericardial effusion.  Small right pleural effusion.  Bibasilar atelectasis. Right basilar bronchiectasis is noted.  Small hiatal hernia.  Small gallstone is suspected.  IMPRESSION: Extensive pulmonary thromboembolism with dilatation of the right ventricle.  Situs inversus.  Bibasilar atelectasis.  Small platter pleural effusion.  Cholelithiasis is suspected.  Original Report Authenticated By: Donavan Burnet, M.D.   Dg Chest Portable 1 View  08/12/2011  *RADIOLOGY REPORT*  Clinical Data: Shortness of breath.  PORTABLE CHEST - 1 VIEW  Comparison: 07/17/2011.  Findings: Situs inversus is again noted.  There are surgical changes from bypass surgery.  There is a small right-sided pleural effusion with overlying atelectasis.  Minimal left basilar atelectasis also.  No edema or pneumothorax.  IMPRESSION: Small right effusion and bibasilar atelectasis.  Original Report Authenticated By: P. Loralie Champagne, M.D.     Events: LE dopplers 3/11 >> prelim positive for B LE DVT >> positive for B PE IVC filter placed 3/11  Subjective: IVC filter placed without complication 3/11 Having R sided chest/flank pain   Vital signs for last 24 hours: Temp:  [98.7 F (37.1 C)-100.5 F (38.1 C)] 98.7 F (37.1 C) (03/12 0400) Pulse Rate:  [111-122] 117  (03/12 0800) Resp:  [16-29] 18  (03/12 0800) BP: (71-119)/(38-75) 101/65 mmHg (03/12 0800) SpO2:  [91 %-100 %] 96 % (03/12 0800) Weight:  [87.8 kg (193 lb 9 oz)] 87.8 kg (193 lb 9 oz) (03/12 0500)  Intake/Output this shift: Total I/O In: 32.5 [I.V.:32.5] Out: -   Vent settings for last 24 hours:    Physical Exam:  General: No apparent distress. Still on high flow O2 Eyes: Anicteric sclerae. ENT: Oropharynx clear. Moist mucous membranes. No thrush Lymph: No  cervical, supraclavicular, or axillary lymphadenopathy. Heart: Normal S1, S2. No murmurs, rubs, or gallops appreciated. No bruits, equal pulses. Lungs: Diminished in the right base with crackles. Abdomen: Abdomen soft, non-tender and not distended, normoactive bowel sounds. No hepatosplenomegaly or masses. Musculoskeletal: No clubbing or synovitis. Skin: No rashes or lesions Neuro: No focal neurologic deficits.  BMET    Component Value Date/Time   NA 134* 08/14/2011 0629   K 3.9 08/14/2011 0629   CL 101 08/14/2011 0629   CO2 23 08/14/2011 0629   GLUCOSE 152* 08/14/2011 0629   BUN 23 08/14/2011 0629   CREATININE 0.95 08/14/2011 0629   CALCIUM 8.8 08/14/2011 0629   GFRNONAA 60* 08/14/2011 0629   GFRAA 69* 08/14/2011 0629   CBC    Component Value Date/Time   WBC 17.2* 08/14/2011 0629   RBC 3.89 08/14/2011 0629   HGB 11.2* 08/14/2011 0629   HCT 35.2* 08/14/2011 0629   PLT 303 08/14/2011 0629   MCV 90.5 08/14/2011 0629   MCH 28.8 08/14/2011 0629   MCHC 31.8 08/14/2011 0629   RDW 16.4* 08/14/2011 0629   LYMPHSABS 2.2 08/12/2011 1743   MONOABS 1.4* 08/12/2011 1743   EOSABS 0.1 08/12/2011 1743   BASOSABS 0.1 08/12/2011 1743    Assessment/Plan Active Problems:  Acute respiratory failure with hypoxia  Saddle embolism of pulmonary artery  Pericardial effusion  Atrial mass  Pulmonary Embolism (radiographically demonstrated, spiral CT and without hemodynamic compromise) Bronchiectasis -- heparin drip. -- currently on 60% FiO2, hemodynamically stable; indications for lytics are narrow, would defer at this time.  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 Once she is recovered from her acute illness it may be reasonable to refer her to [facility] for evaluation of this.  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 Whether this could be clot given risk for ASD - already anticoagulated - will likely need TEE at some point - dextrocardia pits her at risk for other congenital abnormalities, but not necessarily for atrial mass   LOS: 2 days   Best practices / Disposition: -->Step-down status under PCCM -->DNR -->Heparin (treatment) for DVT/PE -->diet   Levy Pupa, MD, PhD 08/14/2011, 8:49 AM [facility] [phone] or if no answer [phone]

## 2011-08-14 NOTE — Progress Notes (Signed)
ANTICOAGULATION CONSULT NOTE - Follow Up Consult  Pharmacy Consult for Heparin/Coumadin Indication: pulmonary embolus and DVT  Allergies  Allergen Reactions  . Penicillins Anaphylaxis   Vital Signs: Temp: 98.7 F (37.1 C) (03/12 0400) Temp src: Oral (03/12 0400) BP: 101/65 mmHg (03/12 0800) Pulse Rate: 117  (03/12 0800)  Labs:  Basename 08/14/11 0629 08/13/11 1743 08/13/11 0915 08/12/11 2330 08/12/11 1804 08/12/11 1750 08/12/11 1743  HGB 11.2* -- 11.7* -- -- -- --  HCT 35.2* -- 36.3 -- 40.0 -- --  PLT 303 -- 293 -- -- -- 300  APTT -- -- -- -- -- 27 --  LABPROT 16.6* -- -- -- -- 15.2 --  INR 1.32 -- -- -- -- 1.18 --  HEPARINUNFRC 0.39 0.31 0.38 -- -- -- --  CREATININE 0.95 -- 1.07 -- 1.30* -- --  CKTOTAL -- 25 18 20  -- -- --  CKMB -- 1.2 1.2 0.9 -- -- --  TROPONINI -- <0.30 <0.30 <0.30 -- -- --   Estimated Creatinine Clearance: 62.4 ml/min (by C-G formula based on Cr of 0.95).   Medications:  Heparin @ 1250 units/hr  Assessment: 70yof continues on heparin for PE/DVT with a therapeutic heparin level. Now to begin coumadin (will need 5 days of overlap therapy). Coumadin score=3 and appears patient will be sensitive to coumadin due to elevated baseline INR of 1.32 and low albumin of 2.6. No bleeding noted.  Goal of Therapy:  Heparin level 0.3-0.7 INR 2-3   Plan:  1) Continue heparin at 1250 units/hr 2) Follow up heparin level in AM 3) Coumadin 2.5mg  x 1 4) Daily INR 5) Coumadin education - book/video  Fredrik Rigger 08/14/2011,9:25 AM

## 2011-08-14 NOTE — ED Provider Notes (Signed)
I saw and evaluated the patient, reviewed the resident's note and I agree with the findings and plan.  I saw the patient along with Dr. Lendell Caprice.  The patient presents complaining of progressive shortness of breath for the past several days.  She underwent CABG within the past 2-3 months and has had somewhat a difficult recovery from this.  On exam, she is noted to be tachycardic, mildly hypotensive, and appears acutely and chronically ill.  The heart exam is normal, however she does have slight rales in the bases of both lungs.  The right leg is noted to have significant edema, the left appears normal.  There are pulses in both legs.  Her history, presentation, and exam are very concerning for PE.  Workup revealed and ekg with low voltage, labs that were non-diagnostic.  We elected to proceed with a CT angio of the chest.  Unfortunately, this revealed a large pulmonary embolus.  The patient was started on heparin and admitted to the intensive care unit.    CRITICAL CARE Performed by: Geoffery Lyons   Total critical care time: 60 minutes  Critical care time was exclusive of separately billable procedures and treating other patients.  Critical care was necessary to treat or prevent imminent or life-threatening deterioration.  Critical care was time spent personally by me on the following activities: development of treatment plan with patient and/or surrogate as well as nursing, discussions with consultants, evaluation of patient's response to treatment, examination of patient, obtaining history from patient or surrogate, ordering and performing treatments and interventions, ordering and review of laboratory studies, ordering and review of radiographic studies, pulse oximetry and re-evaluation of patient's condition.   Geoffery Lyons, MD 08/14/11 [phone]

## 2011-08-14 NOTE — Progress Notes (Signed)
Attempted to place pt. On CPAP auto titrate (Min: 4, max: 10) via nasal mask with 10L O2 bled in. Pt. Immediately pushed mask away and stated that she couldn't stand it. Pt. Doesn't wear CPAP at home. Pt. Stated that she had never had a sleep study done & this was the first time she had even worn CPAP.  Pt. Is currently on NRB and all vitals are stable. RN is aware.

## 2011-08-15 LAB — BASIC METABOLIC PANEL
BUN: 22 mg/dL (ref 6–23)
Calcium: 8.8 mg/dL (ref 8.4–10.5)
GFR calc Af Amer: 73 mL/min — ABNORMAL LOW (ref >90)
GFR calc non Af Amer: 63 mL/min — ABNORMAL LOW (ref >90)
Glucose, Bld: 112 mg/dL — ABNORMAL HIGH (ref 70–99)
Potassium: 3.5 mEq/L (ref 3.5–5.1)
Sodium: 136 mEq/L (ref 135–145)

## 2011-08-15 LAB — GLUCOSE, CAPILLARY
Glucose-Capillary: 123 mg/dL — ABNORMAL HIGH (ref 70–99)
Glucose-Capillary: 128 mg/dL — ABNORMAL HIGH (ref 70–99)
Glucose-Capillary: 128 mg/dL — ABNORMAL HIGH (ref 70–99)

## 2011-08-15 LAB — HEPARIN LEVEL (UNFRACTIONATED)
Heparin Unfractionated: 0.14 IU/mL — ABNORMAL LOW (ref 0.30–0.70)
Heparin Unfractionated: 0.32 IU/mL (ref 0.30–0.70)

## 2011-08-15 LAB — CBC
MCH: 28.9 pg (ref 26.0–34.0)
MCHC: 31.6 g/dL (ref 30.0–36.0)
RDW: 16 % — ABNORMAL HIGH (ref 11.5–15.5)

## 2011-08-15 MED ORDER — HEPARIN (PORCINE) IN NACL 100-0.45 UNIT/ML-% IJ SOLN
1400.0000 [IU]/h | INTRAMUSCULAR | Status: DC
  Administered 2011-08-16 – 2011-08-19 (×4): 1500 [IU]/h via INTRAVENOUS
  Administered 2011-08-19 – 2011-08-21 (×3): 1400 [IU]/h via INTRAVENOUS
  Filled 2011-08-15 (×12): qty 250

## 2011-08-15 MED ORDER — WARFARIN SODIUM 5 MG PO TABS
5.0000 mg | ORAL_TABLET | Freq: Once | ORAL | Status: AC
  Administered 2011-08-15: 5 mg via ORAL
  Filled 2011-08-15: qty 1

## 2011-08-15 MED ORDER — INSULIN ASPART 100 UNIT/ML ~~LOC~~ SOLN
0.0000 [IU] | SUBCUTANEOUS | Status: DC
  Administered 2011-08-15 – 2011-08-16 (×3): 1 [IU] via SUBCUTANEOUS
  Administered 2011-08-16 – 2011-08-17 (×2): 2 [IU] via SUBCUTANEOUS

## 2011-08-15 MED ORDER — HEPARIN BOLUS VIA INFUSION
2500.0000 [IU] | Freq: Once | INTRAVENOUS | Status: AC
  Administered 2011-08-15: 2500 [IU] via INTRAVENOUS
  Filled 2011-08-15: qty 2500

## 2011-08-15 NOTE — Progress Notes (Signed)
ANTICOAGULATION CONSULT NOTE - Follow Up Consult  Pharmacy Consult for Heparin/Coumadin Indication: pulmonary embolus and DVT  Allergies  Allergen Reactions  . Penicillins Anaphylaxis   Vital Signs: Temp: 97.5 F (36.4 C) (03/13 1600) Temp src: Oral (03/13 1600) BP: 94/56 mmHg (03/13 0700) Pulse Rate: 113  (03/13 0700)  Labs:  Basename 08/15/11 1742 08/15/11 0507 08/14/11 0629 08/13/11 1743 08/13/11 0915 08/12/11 2330  HGB -- 10.8* 11.2* -- -- --  HCT -- 34.2* 35.2* -- 36.3 --  PLT -- 315 303 -- 293 --  APTT -- -- -- -- -- --  LABPROT -- 15.6* 16.6* -- -- --  INR -- 1.21 1.32 -- -- --  HEPARINUNFRC 0.32 0.14* 0.39 -- -- --  CREATININE -- 0.91 0.95 -- 1.07 --  CKTOTAL -- -- -- 25 18 20   CKMB -- -- -- 1.2 1.2 0.9  TROPONINI -- -- -- <0.30 <0.30 <0.30   Estimated Creatinine Clearance: 65.3 ml/min (by C-G formula based on Cr of 0.91).  Medications:  Heparin @ 1250 units/hr  Assessment: 70yof on Day #2 Overlap with heparin and coumadin for PE and DVT.  Heparin level is 0.32 back in goal range Goal of Therapy:  INR 2-3 Heparin level 0.3-0.7   Plan:  Continue heparin at 1500 units/hr. Next level in am. Misty Stanley Stillinger 08/15/2011,6:46 PM

## 2011-08-15 NOTE — Progress Notes (Signed)
Patient name: Rainie Crenshaw Trusty Medical record number: [id number] Date of birth: 11-21-1940 Age: 71 y.o. Gender: female PCP: Lolita Patella, MD, MD  PCCM ADMISSION NOTE  Date: 08/12/2011 History of Present Illness: Ms. Robards is a 71 year old lady who underwent a coronary bypass grafting in January, who presented today with right-sided pleuritic chest pain and dyspnea. She underwent CT scanning which showed a large saddle embolus. She was started on a heparin drip and admitted to the ICU for further evaluation. BNP and troponin were negative in ED. Echocardiogram showed right heart strain and a possible mass in the left atrium.  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  Lines/tubes :   Microbiology/Sepsis markers: None   Anti-infectives    None      Protocols:  None  Consults:      Studies: CT angio 3/10 >> large B PE, saddle embolus LE dopplers 3/11 >> prelim positive for B LE DVT >> positive for B DVT IVC filter placed 3/11  Subjective: Cp is better Still on high flow O2  Vital signs for last 24 hours: Temp:  [97.4 F (36.3 C)-98.6 F (37 C)] 98.6 F (37 C) (03/13 0400) Pulse Rate:  [110-125] 113  (03/13 0700) Resp:  [16-27] 18  (03/13 0700) BP: (91-136)/(51-88) 94/56 mmHg (03/13 0700) SpO2:  [85 %-99 %] 96 % (03/13 0700) Weight:  [88.3 kg (194 lb 10.7 oz)] 88.3 kg (194 lb 10.7 oz) (03/13 0500)  Intake/Output this shift:    Vent settings for last 24 hours:    Physical Exam:  General: No apparent distress. Still on high flow O2 Eyes: Anicteric sclerae. ENT: Oropharynx clear. Moist mucous membranes. No thrush Lymph: No cervical, supraclavicular, or axillary lymphadenopathy. Heart: Normal S1, S2. No murmurs, rubs, or gallops appreciated. No bruits, equal pulses. Lungs: Diminished in the right base with crackles. Abdomen: Abdomen soft,  non-tender and not distended, normoactive bowel sounds. No hepatosplenomegaly or masses. Musculoskeletal: No clubbing or synovitis. Skin: No rashes or lesions Neuro: No focal neurologic deficits.  BMET    Component Value Date/Time   NA 136 08/15/2011 0507   K 3.5 08/15/2011 0507   CL 102 08/15/2011 0507   CO2 24 08/15/2011 0507   GLUCOSE 112* 08/15/2011 0507   BUN 22 08/15/2011 0507   CREATININE 0.91 08/15/2011 0507   CALCIUM 8.8 08/15/2011 0507   GFRNONAA 63* 08/15/2011 0507   GFRAA 73* 08/15/2011 0507   CBC    Component Value Date/Time   WBC 12.6* 08/15/2011 0507   RBC 3.74* 08/15/2011 0507   HGB 10.8* 08/15/2011 0507   HCT 34.2* 08/15/2011 0507   PLT 315 08/15/2011 0507   MCV 91.4 08/15/2011 0507   MCH 28.9 08/15/2011 0507   MCHC 31.6 08/15/2011 0507   RDW 16.0* 08/15/2011 0507   LYMPHSABS 2.2 08/12/2011 1743   MONOABS 1.4* 08/12/2011 1743   EOSABS 0.1 08/12/2011 1743   BASOSABS 0.1 08/12/2011 1743    Assessment/Plan Active Problems:  Acute respiratory failure with hypoxia  Saddle embolism of pulmonary artery  Pericardial effusion  Atrial mass  Pulmonary Embolism (radiographically demonstrated, spiral CT and without hemodynamic compromise) Bronchiectasis -- heparin drip, coumadin started; she has hx remote DVT so will probably need coumadin for life -- currently on 60% FiO2, hemodynamically stable; indications for lytics are narrow, continue to defer at this time.  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 Once she is recovered from her acute illness it may be reasonable to refer her to [facility] for evaluation of this.  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 Whether this  could be clot given risk for ASD - already anticoagulated - will likely need TEE at some point - dextrocardia puts her at risk for other congenital abnormalities, but not necessarily for atrial mass   LOS: 3 days   Best practices / Disposition: -->Step-down status under PCCM -->DNR -->Heparin (treatment) for DVT/PE -->diet   Levy Pupa, MD, PhD 08/15/2011, 11:47 AM [facility] [phone] or if no answer [phone]

## 2011-08-15 NOTE — Progress Notes (Signed)
Speech Language/Pathology SLP Cancellation Note  Treatment cancelled today due to medical issues with patient which prohibited therapy. Patient continues to have high oxygen requirements, on non-rebreather mask. Discussed with pulmonologist who is in agreement to complete MBS when patient able to safely transition to nasal cannula.   Ferdinand Lango MA, CCC-SLP [phone]   [name] 08/15/2011, 12:05 PM

## 2011-08-15 NOTE — Progress Notes (Signed)
Report from Night RN. Chart reviewed together. Handoff complete.  

## 2011-08-15 NOTE — Progress Notes (Signed)
ANTICOAGULATION CONSULT NOTE - Follow Up Consult  Pharmacy Consult for Heparin/Coumadin Indication: pulmonary embolus and DVT  Allergies  Allergen Reactions  . Penicillins Anaphylaxis   Vital Signs: Temp: 98.6 F (37 C) (03/13 0400) Temp src: Oral (03/13 0400) BP: 94/56 mmHg (03/13 0700) Pulse Rate: 113  (03/13 0700)  Labs:  Basename 08/15/11 0507 08/14/11 0629 08/13/11 1743 08/13/11 0915 08/12/11 2330 08/12/11 1750  HGB 10.8* 11.2* -- -- -- --  HCT 34.2* 35.2* -- 36.3 -- --  PLT 315 303 -- 293 -- --  APTT -- -- -- -- -- 27  LABPROT 15.6* 16.6* -- -- -- 15.2  INR 1.21 1.32 -- -- -- 1.18  HEPARINUNFRC 0.14* 0.39 0.31 -- -- --  CREATININE 0.91 0.95 -- 1.07 -- --  CKTOTAL -- -- 25 18 20  --  CKMB -- -- 1.2 1.2 0.9 --  TROPONINI -- -- <0.30 <0.30 <0.30 --   Estimated Creatinine Clearance: 65.3 ml/min (by C-G formula based on Cr of 0.91).  Medications:  Heparin @ 1250 units/hr  Assessment: 70yof on Day #2 Overlap with heparin and coumadin for PE and DVT.  Heparin level is subtherapeutic.  INR actually dropped from yesterday after first dose of coumadin (dose charted as given) - will increase dose today.  No bleeding noted per chart notes.  Goal of Therapy:  INR 2-3 Heparin level 0.3-0.7   Plan:  1) Heparin bolus 2500 units x 1 2) Increase heparin drip to 1500 units/hr 3) 6h heparin level 4) Coumadin 5mg  x 1 5) Follow up INR in AM  Fredrik Rigger 08/15/2011,10:16 AM

## 2011-08-16 ENCOUNTER — Inpatient Hospital Stay (HOSPITAL_COMMUNITY): Payer: Medicare Other

## 2011-08-16 DIAGNOSIS — J9600 Acute respiratory failure, unspecified whether with hypoxia or hypercapnia: Secondary | ICD-10-CM

## 2011-08-16 DIAGNOSIS — I2699 Other pulmonary embolism without acute cor pulmonale: Secondary | ICD-10-CM

## 2011-08-16 LAB — GLUCOSE, CAPILLARY
Glucose-Capillary: 138 mg/dL — ABNORMAL HIGH (ref 70–99)
Glucose-Capillary: 109 mg/dL — ABNORMAL HIGH (ref 70–99)
Glucose-Capillary: 191 mg/dL — ABNORMAL HIGH (ref 70–99)
Glucose-Capillary: 128 mg/dL — ABNORMAL HIGH (ref 70–99)

## 2011-08-16 LAB — BASIC METABOLIC PANEL
CO2: 24 mEq/L (ref 19–32)
Calcium: 8.8 mg/dL (ref 8.4–10.5)
Chloride: 104 mEq/L (ref 96–112)
Glucose, Bld: 116 mg/dL — ABNORMAL HIGH (ref 70–99)
Potassium: 3.6 mEq/L (ref 3.5–5.1)
Sodium: 136 mEq/L (ref 135–145)

## 2011-08-16 LAB — CBC
HCT: 33.3 % — ABNORMAL LOW (ref 36.0–46.0)
Hemoglobin: 10.5 g/dL — ABNORMAL LOW (ref 12.0–15.0)
MCV: 91.7 fL (ref 78.0–100.0)
WBC: 10.7 10*3/uL — ABNORMAL HIGH (ref 4.0–10.5)

## 2011-08-16 LAB — PROTIME-INR: INR: 1.3 (ref 0.00–1.49)

## 2011-08-16 LAB — HEPARIN LEVEL (UNFRACTIONATED): Heparin Unfractionated: 0.38 IU/mL (ref 0.30–0.70)

## 2011-08-16 MED ORDER — WARFARIN SODIUM 5 MG PO TABS
5.0000 mg | ORAL_TABLET | Freq: Once | ORAL | Status: AC
  Administered 2011-08-16: 5 mg via ORAL
  Filled 2011-08-16: qty 1

## 2011-08-16 NOTE — Progress Notes (Addendum)
Patient name: [name] Medical record number: [id number] Date of birth: 09-Feb-1941 Age: 71 y.o. Gender: female PCP: Lolita Patella, MD, MD  PCCM ADMISSION NOTE  Date: 08/12/2011 History of Present Illness: [name] is a 71 year old lady who underwent a coronary bypass grafting in January, who presented today with right-sided pleuritic chest pain and dyspnea. She underwent CT scanning which showed a large saddle embolus. She was started on a heparin drip and admitted to the ICU for further evaluation. BNP and troponin were negative in ED. Echocardiogram showed right heart strain and a possible mass in the left atrium.  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  Lines/tubes  Microbiology/Sepsis markers: None   Anti-infectives    None      Protocols:  None  Consults:   Speech Rx Coumadin/pharmacy  Studies: CT angio 3/10 >> large B PE, saddle embolus LE dopplers 3/11 >> prelim positive for B LE DVT >> positive for B DVT IVC filter placed 3/11  Subjective: O2 slowly being weaned, now on 45-50% mask Still w R pleuritic CP Up to chair today Remains NPO  Vital signs for last 24 hours: Temp:  [97.5 F (36.4 C)-99.4 F (37.4 C)] 99.4 F (37.4 C) (03/14 0400) Pulse Rate:  [106-114] 109  (03/14 0800) Resp:  [17-26] 20  (03/14 0800) BP: (94-112)/(56-67) 99/56 mmHg (03/14 0700) SpO2:  [89 %-99 %] 91 % (03/14 0810) FiO2 (%):  [35 %-50 %] 50 % (03/14 0810) Weight:  [89.9 kg (198 lb 3.1 oz)] 89.9 kg (198 lb 3.1 oz) (03/14 0500)  Intake/Output this shift: Total I/O In: 35 [I.V.:35] Out: -   Vent settings for last 24 hours: Vent Mode:  [-]  FiO2 (%):  [35 %-50 %] 50 %  Physical Exam:  General: No apparent distress. Up in chair Eyes: Anicteric sclerae. ENT: Oropharynx clear. Moist mucous membranes. No thrush Lymph: No cervical, supraclavicular, or  axillary lymphadenopathy. Heart: Normal S1, S2. No murmurs, rubs, or gallops appreciated. No bruits, equal pulses. Lungs: Diminished in the right base with crackles. Abdomen: Abdomen soft, non-tender and not distended, normoactive bowel sounds. No hepatosplenomegaly or masses. Musculoskeletal: No clubbing or synovitis. Skin: No rashes or lesions Neuro: No focal neurologic deficits.  BMET    Component Value Date/Time   NA 136 08/16/2011 0607   K 3.6 08/16/2011 0607   CL 104 08/16/2011 0607   CO2 24 08/16/2011 0607   GLUCOSE 116* 08/16/2011 0607   BUN 19 08/16/2011 0607   CREATININE 0.90 08/16/2011 0607   CALCIUM 8.8 08/16/2011 0607   GFRNONAA 64* 08/16/2011 0607   GFRAA 74* 08/16/2011 0607   CBC    Component Value Date/Time   WBC 10.7* 08/16/2011 0607   RBC 3.63* 08/16/2011 0607   HGB 10.5* 08/16/2011 0607   HCT 33.3* 08/16/2011 0607   PLT 309 08/16/2011 0607   MCV 91.7 08/16/2011 0607   MCH 28.9 08/16/2011 0607   MCHC 31.5 08/16/2011 0607   RDW 15.7* 08/16/2011 0607   LYMPHSABS 2.2 08/12/2011 1743   MONOABS 1.4* 08/12/2011 1743   EOSABS 0.1 08/12/2011 1743   BASOSABS 0.1 08/12/2011 1743    Assessment/Plan Active Problems:  Acute respiratory failure with hypoxia  Saddle embolism of pulmonary artery  Pericardial effusion  Atrial mass  Pulmonary Embolism (radiographically demonstrated, spiral CT and without hemodynamic compromise) Bronchiectasis -- heparin drip, coumadin started; she has hx remote DVT so  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 Whether this could be clot given risk for ASD - already anticoagulated - will likely need TEE at some point - dextrocardia puts her at risk for other congenital abnormalities, but not necessarily for atrial mass   LOS: 4 days   Best practices / Disposition: -->Step-down status under PCCM -->DNR -->Heparin (treatment) for DVT/PE -->diet   Levy Pupa, MD, PhD 08/16/2011, 9:32 AM [facility] [phone] or if no answer [phone]

## 2011-08-16 NOTE — Progress Notes (Signed)
ANTICOAGULATION CONSULT NOTE - Follow Up Consult  Pharmacy Consult for Heparin / Coumadin Indication: pulmonary embolus  / DVT  Allergies  Allergen Reactions  . Penicillins Anaphylaxis    Patient Measurements: Height: 5\' 6"  (167.6 cm) Weight: 198 lb 3.1 oz (89.9 kg) IBW/kg (Calculated) : 59.3    Vital Signs: Temp: 99.4 F (37.4 C) (03/14 0400) Temp src: Oral (03/14 0400) BP: 99/56 mmHg (03/14 0700) Pulse Rate: 109  (03/14 0800)  Labs:  Alvira Philips 08/16/11 0607 08/15/11 1742 08/15/11 0507 08/14/11 0629 08/13/11 1743 08/13/11 0915  HGB 10.5* -- 10.8* -- -- --  HCT 33.3* -- 34.2* 35.2* -- --  PLT 309 -- 315 303 -- --  APTT -- -- -- -- -- --  LABPROT 16.4* -- 15.6* 16.6* -- --  INR 1.30 -- 1.21 1.32 -- --  HEPARINUNFRC 0.38 0.32 0.14* -- -- --  CREATININE 0.90 -- 0.91 0.95 -- --  CKTOTAL -- -- -- -- 25 18  CKMB -- -- -- -- 1.2 1.2  TROPONINI -- -- -- -- <0.30 <0.30   Estimated Creatinine Clearance: 66.6 ml/min (by C-G formula based on Cr of 0.9).   Assessment: 70yof with new saddle PE and LLE DVT.  Heparin drip rate 1500 uts/hr HL 0.38 at goal 0.3-0.7.  Day#2/5 overalp with Coumadin.  INR slightly increased 1.3 < goal 2-3.   CBC stable, no s/s bleeding noted, renal fx stable.  Hr elevated 100's, BP stable 100/60, O2 sat in 90's with venturi mask.  Goal of Therapy:  INR 2-3 Heparin level 0.3-0.7 units/ml   Plan:  Continue Heparin 1500 uts/hr Coumadin 5mg  x1 again today may need inc dose Daily INR, Heaprin level  Marcelino Scot 08/16/2011,8:38 AM

## 2011-08-17 ENCOUNTER — Inpatient Hospital Stay (HOSPITAL_COMMUNITY): Payer: Medicare Other

## 2011-08-17 LAB — CBC
MCH: 29.4 pg (ref 26.0–34.0)
MCHC: 32.6 g/dL (ref 30.0–36.0)
MCV: 90 fL (ref 78.0–100.0)
Platelets: 335 10*3/uL (ref 150–400)
RBC: 3.71 MIL/uL — ABNORMAL LOW (ref 3.87–5.11)

## 2011-08-17 LAB — GLUCOSE, CAPILLARY
Glucose-Capillary: 158 mg/dL — ABNORMAL HIGH (ref 70–99)
Glucose-Capillary: 122 mg/dL — ABNORMAL HIGH (ref 70–99)

## 2011-08-17 LAB — BASIC METABOLIC PANEL
BUN: 13 mg/dL (ref 6–23)
Creatinine, Ser: 0.8 mg/dL (ref 0.50–1.10)
GFR calc non Af Amer: 74 mL/min — ABNORMAL LOW (ref >90)
Glucose, Bld: 158 mg/dL — ABNORMAL HIGH (ref 70–99)
Potassium: 3.7 mEq/L (ref 3.5–5.1)

## 2011-08-17 MED ORDER — BIOTENE DRY MOUTH MT LIQD
15.0000 mL | Freq: Two times a day (BID) | OROMUCOSAL | Status: DC
  Administered 2011-08-17 – 2011-08-18 (×4): 15 mL via OROMUCOSAL

## 2011-08-17 MED ORDER — INSULIN ASPART 100 UNIT/ML ~~LOC~~ SOLN: 0.0000 [IU] | Freq: Three times a day (TID) | SUBCUTANEOUS | Status: DC

## 2011-08-17 MED ORDER — WARFARIN SODIUM 5 MG PO TABS
5.0000 mg | ORAL_TABLET | Freq: Once | ORAL | Status: AC
  Administered 2011-08-17: 5 mg via ORAL
  Filled 2011-08-17: qty 1

## 2011-08-17 MED ORDER — INSULIN ASPART 100 UNIT/ML ~~LOC~~ SOLN
0.0000 [IU] | Freq: Three times a day (TID) | SUBCUTANEOUS | Status: DC
  Administered 2011-08-18 – 2011-08-24 (×5): 1 [IU] via SUBCUTANEOUS

## 2011-08-17 MED ORDER — CHLORHEXIDINE GLUCONATE 0.12 % MT SOLN
15.0000 mL | Freq: Two times a day (BID) | OROMUCOSAL | Status: DC
  Administered 2011-08-17 – 2011-08-18 (×3): 15 mL via OROMUCOSAL
  Filled 2011-08-17 (×3): qty 15

## 2011-08-17 NOTE — Progress Notes (Signed)
ANTICOAGULATION CONSULT NOTE - Follow Up Consult  Pharmacy Consult for Heparin/Coumadin Indication: pulmonary embolus/DVT  Allergies  Allergen Reactions  . Penicillins Anaphylaxis   Vital Signs: Temp: 97.1 F (36.2 C) (03/15 1150) Temp src: Oral (03/15 1150) BP: 87/59 mmHg (03/15 1300) Pulse Rate: 103  (03/15 1300)  Labs:  Basename 08/17/11 1402 08/16/11 0607 08/15/11 1742 08/15/11 0507  HGB 10.9* 10.5* -- --  HCT 33.4* 33.3* -- 34.2*  PLT 335 309 -- 315  APTT -- -- -- --  LABPROT 18.0* 16.4* -- 15.6*  INR 1.46 1.30 -- 1.21  HEPARINUNFRC 0.39 0.38 0.32 --  CREATININE -- 0.90 -- 0.91  CKTOTAL -- -- -- --  CKMB -- -- -- --  TROPONINI -- -- -- --   Estimated Creatinine Clearance: 66.3 ml/min (by C-G formula based on Cr of 0.9).  Medications:  Heparin @ 1500 units/hr  Assessment: 70yof on day #3/5 overlap with heparin and coumadin for saddle PE and LLE DVT. Heparin level is at goal. INR trending up appropriately with 5mg  doses of coumadin. No bleeding noted.  Goal of Therapy:  INR 2-3 Heparin level 0.3-0.7   Plan:  1) Continue heparin at 1500 units/hr 2) Repeat coumadin 5mg  x 1 3) Follow up heparin level and INR in AM  Fredrik Rigger 08/17/2011,2:51 PM

## 2011-08-17 NOTE — Progress Notes (Signed)
Speech Pathology: Dysphagia Treatment Note  Subjective: Awake, alert, sitting upright in recliner  Objective: Treatment focused on diagnostic-treatment of PO consumption.  Patient with venturi mask at 50% Fio2 with Spo2 averaging 98% throughout session and only 1 mild desaturation to 90% with mask pulled away for self feeding.  Patient required minimal assist to manage venturi mask and self feeding.  PO trials of thin liquids and puree revealed no overt clinical indicators of an oral-pharyngeal dysphagia with symptom report of globus with solids PTA.  Reviewed esophageal strategies to facilitate mobility of bolus including alternating bite/sip and remaining upright throughout and following a meal.  Dentures not fitting well and patient unwilling to place them at this time.  Assessment: Demonstrates a likely primary esophageal based dysphagia.  Patient's risk for aspiration is higher at this time due to high O2 needs as well as compromised breathing/swallowing coordination.  However, discussion with Dr. Delton Coombes revealed that the cost/benefit ratio to remain NPO at this time warrants initiating a diet.  Patient will need an objective measure of esophageal function when respiratory status is less compromised via Barium Swallow.  Advise Radiologist to assess for a prominent CP segment in the cervical esophagus.  Recommendations:  1.  Dysphagia 2 (minced) diet & Thin liquids 2.  Full supervision to manage venturi mask and self-feeding coordination 3.  Alternate solid/liquids 4.  Sit upright during and 30 minutes following all PO consumption  Pain:   none Intervention Required:   No  Goals: Goals Partially Met  Myra Rude, M.S.,CCC-SLP Pager [phone]

## 2011-08-17 NOTE — Progress Notes (Signed)
Patient name: [name] Medical record number: [id number] Date of birth: May 24, 1941 Age: 71 y.o. Gender: female PCP: Alyssa Patella, MD, MD  PCCM ADMISSION NOTE  Date: 08/12/2011 History of Present Illness: [name] is a 71 year old lady who underwent a coronary bypass grafting in January, who presented today with right-sided pleuritic chest pain and dyspnea. She underwent CT scanning which showed a large saddle embolus. She was started on a heparin drip and admitted to the ICU for further evaluation. BNP and troponin were negative in ED. Echocardiogram showed right heart strain and a possible mass in the left atrium.  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  Lines/tubes  Microbiology/Sepsis markers: None   Anti-infectives    None      Protocols:  None  Consults:   Speech Rx Coumadin/pharmacy  Studies: CT angio 3/10 >> large B PE, saddle embolus LE dopplers 3/11 >> prelim positive for B LE DVT >> positive for B DVT IVC filter placed 3/11  Subjective: O2 slowly being weaned, now on 45-50% mask Hungry, swallow eval suggests esophageal phase dysphagia, should be ok to start soft PO's  Vital signs for last 24 hours: Temp:  [97.8 F (36.6 C)-98.6 F (37 C)] 98.5 F (36.9 C) (03/15 0809) Pulse Rate:  [100-111] 105  (03/15 0900) Resp:  [17-27] 21  (03/15 0900) BP: (88-120)/(54-83) 91/56 mmHg (03/15 0900) SpO2:  [89 %-97 %] 97 % (03/15 0900) FiO2 (%):  [35 %-50 %] 50 % (03/15 0900) Weight:  [89 kg (196 lb 3.4 oz)] 89 kg (196 lb 3.4 oz) (03/15 0500)  Intake/Output this shift: Total I/O In: 155 [P.O.:120; I.V.:35] Out: 450 [Urine:450]  Vent settings for last 24 hours: Vent Mode:  [-]  FiO2 (%):  [35 %-50 %] 50 %  Physical Exam:  General: No apparent distress. Up in chair Eyes: Anicteric sclerae. ENT: Oropharynx clear. Moist mucous  membranes. No thrush Lymph: No cervical, supraclavicular, or axillary lymphadenopathy. Heart: Normal S1, S2. No murmurs, rubs, or gallops appreciated. No bruits, equal pulses. Lungs: Diminished in the right base with crackles. Abdomen: Abdomen soft, non-tender and not distended, normoactive bowel sounds. No hepatosplenomegaly or masses. Musculoskeletal: No clubbing or synovitis. Skin: No rashes or lesions Neuro: No focal neurologic deficits.  BMET    Component Value Date/Time   NA 136 08/16/2011 0607   K 3.6 08/16/2011 0607   CL 104 08/16/2011 0607   CO2 24 08/16/2011 0607   GLUCOSE 116* 08/16/2011 0607   BUN 19 08/16/2011 0607   CREATININE 0.90 08/16/2011 0607   CALCIUM 8.8 08/16/2011 0607   GFRNONAA 64* 08/16/2011 0607   GFRAA 74* 08/16/2011 0607   CBC    Component Value Date/Time   WBC 10.7* 08/16/2011 0607   RBC 3.63* 08/16/2011 0607   HGB 10.5* 08/16/2011 0607   HCT 33.3* 08/16/2011 0607   PLT 309 08/16/2011 0607   MCV 91.7 08/16/2011 0607   MCH 28.9 08/16/2011 0607   MCHC 31.5 08/16/2011 0607   RDW 15.7* 08/16/2011 0607   LYMPHSABS 2.2 08/12/2011 1743   MONOABS 1.4* 08/12/2011 1743   EOSABS 0.1 08/12/2011 1743   BASOSABS 0.1 08/12/2011 1743   CXR 3/15 >> no change RLL atx  Assessment/Plan Active Problems:  Acute respiratory failure with hypoxia  Saddle embolism of pulmonary artery  Pericardial effusion  Atrial mass  Pulmonary Embolism (radiographically demonstrated, spiral CT and without hemodynamic compromise) Bronchiectasis --  heparin drip, coumadin started; she has hx remote DVT so 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  -- IVC filter placed since recurrent PE would be potentially devastating  -- OOB with assistance; atx playing a role in her hypoxemia --pain control -- am CXR to eval possible R effusion --This patient almost certainly has primary ciliary  dyskinesia with situs inversus. 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 Whether this could be clot given risk for ASD - already anticoagulated - will likely need TEE at some point - dextrocardia puts her at risk for other congenital abnormalities, but not necessarily for atrial mass   LOS: 5 days   Best practices / Disposition: -->Step-down status under PCCM -->DNR -->Heparin (treatment) for DVT/PE -->diet   Levy Pupa, MD, PhD 08/17/2011, 10:56 AM [facility] [phone] or if no answer [phone]

## 2011-08-18 ENCOUNTER — Inpatient Hospital Stay (HOSPITAL_COMMUNITY): Payer: Medicare Other

## 2011-08-18 DIAGNOSIS — I2699 Other pulmonary embolism without acute cor pulmonale: Secondary | ICD-10-CM

## 2011-08-18 DIAGNOSIS — J9600 Acute respiratory failure, unspecified whether with hypoxia or hypercapnia: Secondary | ICD-10-CM

## 2011-08-18 LAB — CBC
HCT: 33 % — ABNORMAL LOW (ref 36.0–46.0)
MCHC: 31.8 g/dL (ref 30.0–36.0)
Platelets: 334 10*3/uL (ref 150–400)
RDW: 15.5 % (ref 11.5–15.5)
WBC: 9.5 10*3/uL (ref 4.0–10.5)

## 2011-08-18 LAB — GLUCOSE, CAPILLARY: Glucose-Capillary: 108 mg/dL — ABNORMAL HIGH (ref 70–99)

## 2011-08-18 LAB — BASIC METABOLIC PANEL
Calcium: 9 mg/dL (ref 8.4–10.5)
Creatinine, Ser: 0.83 mg/dL (ref 0.50–1.10)
GFR calc Af Amer: 82 mL/min — ABNORMAL LOW (ref >90)
GFR calc non Af Amer: 70 mL/min — ABNORMAL LOW (ref >90)
Sodium: 138 mEq/L (ref 135–145)

## 2011-08-18 LAB — HEPARIN LEVEL (UNFRACTIONATED): Heparin Unfractionated: 0.47 IU/mL (ref 0.30–0.70)

## 2011-08-18 LAB — PROTIME-INR
INR: 1.55 — ABNORMAL HIGH (ref 0.00–1.49)
Prothrombin Time: 18.9 seconds — ABNORMAL HIGH (ref 11.6–15.2)

## 2011-08-18 MED ORDER — WARFARIN SODIUM 5 MG PO TABS
5.0000 mg | ORAL_TABLET | Freq: Once | ORAL | Status: AC
  Administered 2011-08-18: 5 mg via ORAL
  Filled 2011-08-18: qty 1

## 2011-08-18 NOTE — Progress Notes (Signed)
Patient name: [name] Medical record number: [id number] Date of birth: Jan 03, 1941 Age: 71 y.o. Gender: female PCP: Lolita Patella, MD, MD  PCCM ADMISSION NOTE  Date: 08/12/2011 History of Present Illness: [name] is a 71 year old lady who underwent a coronary bypass grafting in January, who presented today with right-sided pleuritic chest pain and dyspnea. She underwent CT scanning which showed a large saddle embolus. She was started on a heparin drip and admitted to the ICU for further evaluation. BNP and troponin were negative in ED. Echocardiogram showed right heart strain and a possible mass in the left atrium.  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  Lines/tubes  Microbiology/Sepsis markers: None   Anti-infectives    None      Protocols:  None  Consults:   Speech Rx Coumadin/pharmacy  Studies: CT angio 3/10 >> large B PE, saddle embolus LE dopplers 3/11 >> prelim positive for B LE DVT >> positive for B DVT IVC filter placed 3/11  Subjective: Tolerated oral foods  Cont to desat off venti mask.  Cont to have pleurtitic pain   Vital signs for last 24 hours: Temp:  [97.1 F (36.2 C)-98.5 F (36.9 C)] 97.3 F (36.3 C) (03/16 0800) Pulse Rate:  [97-107] 107  (03/16 0900) Resp:  [17-26] 26  (03/16 0900) BP: (87-143)/(58-80) 108/76 mmHg (03/16 0900) SpO2:  [90 %-96 %] 94 % (03/16 0900) FiO2 (%):  [35 %-50 %] 35 % (03/16 0900) Weight:  [89.3 kg (196 lb 13.9 oz)-90.5 kg (199 lb 8.3 oz)] 89.3 kg (196 lb 13.9 oz) (03/16 0500)  Intake/Output this shift: Total I/O In: 345 [P.O.:200; I.V.:145] Out: -   Vent settings for last 24 hours: Vent Mode:  [-]  FiO2 (%):  [35 %-50 %] 35 %  Physical Exam:  General: No apparent distress. Awake , sitting up in bed  Eyes: Anicteric sclerae. ENT: Oropharynx clear. Moist mucous membranes. No  thrush Lymph: No cervical, supraclavicular, or axillary lymphadenopathy. Heart: Normal S1, S2. No murmurs, rubs, or gallops appreciated. No bruits, equal pulses. Lungs: Diminished in the right base with crackles. Abdomen: Abdomen soft, non-tender and not distended, normoactive bowel sounds. No hepatosplenomegaly or masses. Musculoskeletal: No clubbing or synovitis. Skin: No rashes or lesions Neuro: No focal neurologic deficits.  BMET    Component Value Date/Time   NA 138 08/18/2011 0600   K 3.6 08/18/2011 0600   CL 103 08/18/2011 0600   CO2 27 08/18/2011 0600   GLUCOSE 113* 08/18/2011 0600   BUN 10 08/18/2011 0600   CREATININE 0.83 08/18/2011 0600   CALCIUM 9.0 08/18/2011 0600   GFRNONAA 70* 08/18/2011 0600   GFRAA 82* 08/18/2011 0600   CBC    Component Value Date/Time   WBC 9.5 08/18/2011 0600   RBC 3.69* 08/18/2011 0600   HGB 10.5* 08/18/2011 0600   HCT 33.0* 08/18/2011 0600   PLT 334 08/18/2011 0600   MCV 89.4 08/18/2011 0600   MCH 28.5 08/18/2011 0600   MCHC 31.8 08/18/2011 0600   RDW 15.5 08/18/2011 0600   LYMPHSABS 2.2 08/12/2011 1743   MONOABS 1.4* 08/12/2011 1743   EOSABS 0.1 08/12/2011 1743   BASOSABS 0.1 08/12/2011 1743   CXR 3/16 >> no change RLL atx - dextrocardia explains this  Assessment/Plan Active Problems:  Acute respiratory failure with hypoxia  Saddle embolism of pulmonary artery  Pericardial effusion  Atrial mass  Pulmonary Embolism (radiographically  demonstrated, spiral CT and without hemodynamic compromise) Bronchiectasis -- heparin drip, coumadin bridge  she has hx remote DVT so will probably need coumadin for life -- currently on 35% FiO2, hemodynamically stable; indications for lytics are narrow, continue to defer at this time.  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 Whether this could be clot given risk for ASD - already anticoagulated - will likely need TEE at some point - dextrocardia puts her at risk for other congenital abnormalities, but not necessarily for atrial mass   LOS: 6 days   Best practices / Disposition: -->Step-down status under PCCM - ok to transfer to tele if down to [place] -->DNR -->Heparin (treatment) for DVT/PE -->diet  PARRETT,TAMMY NP -C  08/18/2011, 11:21 AM [facility] [phone]   Independently examined pt, evaluated data & formulated above care plan with NP Cyril Mourning MD. FCCP. [facility] Pager [phone] If no response call [phone]

## 2011-08-18 NOTE — Progress Notes (Signed)
ANTICOAGULATION CONSULT NOTE - Follow Up Consult  Pharmacy Consult for Heparin/Coumadin Indication: pulmonary embolus/DVT  Allergies  Allergen Reactions  . Penicillins Anaphylaxis   Vital Signs: Temp: 97.3 F (36.3 C) (03/16 0800) Temp src: Oral (03/16 0800) BP: 107/64 mmHg (03/16 0740) Pulse Rate: 102  (03/16 0740)  Labs:  Basename 08/18/11 0600 08/17/11 1402 08/16/11 0607  HGB 10.5* 10.9* --  HCT 33.0* 33.4* 33.3*  PLT 334 335 309  APTT -- -- --  LABPROT 18.9* 18.0* 16.4*  INR 1.55* 1.46 1.30  HEPARINUNFRC 0.47 0.39 0.38  CREATININE 0.83 0.80 0.90  CKTOTAL -- -- --  CKMB -- -- --  TROPONINI -- -- --   Estimated Creatinine Clearance: 72 ml/min (by C-G formula based on Cr of 0.83).  Medications:  Heparin @ 1500 units/hr  Assessment: 70yof on day #5 overlap with heparin and coumadin for saddle PE and LLE DVT. Heparin level is therapeutic. INR subtherapeutic but trending up appropriately with 5mg  doses of coumadin. No bleeding noted.  Hgb low, stable.  Plts OK.    Goal of Therapy:  INR 2-3 Heparin level 0.3-0.7   Plan:  1) Continue heparin at 1500 units/hr 2) Repeat coumadin 5mg  x 1 3) Follow up heparin level and INR in AM  Tamea Bai E 08/18/2011,8:47 AM

## 2011-08-18 NOTE — Progress Notes (Signed)
Pt had brief run of SVT while on BSC prior to transfer to telemetry.  Pt states felt heart going fast but no other symptoms.  VVS.  Dr. Tula Nakayama in [facility] notified.  OK to still transfer.  Will transfer on monitor.

## 2011-08-18 NOTE — Progress Notes (Signed)
Speech Language/Pathology Speech Language Pathology Swallow Treatment Patient Details Name: [name] MRN: [id number] DOB: 07/28/1940 Today's Date: 08/18/2011  SLP Assessment/Plan/Recommendation Assessment Clinical Impression Statement: Patient seen by ST for diagnostic treatment for diet tolerance of dysphagia 2 with thin liquids. Improved respiratory status from previous date as patient receiving oxygen per nasal cannula  vs. venturi mask. Patient was transferred from bed to chair by nursing prior to PO trials.  Patient with baseline effective, wet cough.  Patient observed with magic cup x4 teaspoons  and thin water by cup/straw.  No outward s/s of aspiration  noted during trials but slight increase of WOB following swallow of thin liquid by cup/straw.    Patient reports continued  "globus sensation" with solids and required minimal verbal cues to utilize esophageal strategy of alternating bite/sip.  Patient instructed to complete effortful swallow with solids/liquid in which she stated was effective in decreasing sensation of "food sticking".  Patient given skilled education on strategies to improve  swallow/respiration coordination with repeat demonstration of understanding. Barium swallow study recommended by ST on 08-17-11 to assess esophageal functioning. Recommend to continue current diet of dysphagia 2 with thin liquids with full supervision with all PO intake to provide cues as needed. ST to follow for diet tolerance and possible diet advancement.     Swallowing Goals  SLP Swallowing Goals Patient will consume recommended diet without observed clinical signs of aspiration with: Supervision/safety Patient will utilize recommended strategies during swallow to increase swallowing safety with: Supervision/safety Swallow Study Goal #3 - Progress: Progressing toward goal  General Patient observed directly with PO's: Yes Type of PO's observed: Thin liquids;Dysphagia 1 (puree) Feeding:  Needs assist Liquids provided via: Cup;Straw Temperature Spikes Noted: No Respiratory Status: Supplemental O2 delivered via (comment) Behavior/Cognition: Cooperative;Alert;Pleasant mood Oral Cavity - Dentition: Edentulous Patient Positioning: Upright in chair  Oral Cavity - Oral Hygiene Does patient have any of the following "at risk" factors?: Oxygen therapy - cannula, mask, simple oxygen devices Patient is HIGH RISK - Oral Care Protocol followed (see row info): Yes     Moreen Fowler M.S., CCC-SLP [phone] [facility] 08/18/2011, 3:22 PM

## 2011-08-19 ENCOUNTER — Inpatient Hospital Stay (HOSPITAL_COMMUNITY): Payer: Medicare Other

## 2011-08-19 DIAGNOSIS — J9600 Acute respiratory failure, unspecified whether with hypoxia or hypercapnia: Secondary | ICD-10-CM

## 2011-08-19 DIAGNOSIS — I2699 Other pulmonary embolism without acute cor pulmonale: Secondary | ICD-10-CM

## 2011-08-19 LAB — CBC
HCT: 36.6 % (ref 36.0–46.0)
Hemoglobin: 11.7 g/dL — ABNORMAL LOW (ref 12.0–15.0)
MCH: 28.6 pg (ref 26.0–34.0)
MCHC: 32 g/dL (ref 30.0–36.0)
MCV: 89.5 fL (ref 78.0–100.0)

## 2011-08-19 LAB — GLUCOSE, CAPILLARY
Glucose-Capillary: 100 mg/dL — ABNORMAL HIGH (ref 70–99)
Glucose-Capillary: 98 mg/dL (ref 70–99)
Glucose-Capillary: 105 mg/dL — ABNORMAL HIGH (ref 70–99)

## 2011-08-19 LAB — BASIC METABOLIC PANEL
BUN: 7 mg/dL (ref 6–23)
CO2: 22 mEq/L (ref 19–32)
Chloride: 103 mEq/L (ref 96–112)
Creatinine, Ser: 0.72 mg/dL (ref 0.50–1.10)
Glucose, Bld: 107 mg/dL — ABNORMAL HIGH (ref 70–99)
Potassium: 3.9 mEq/L (ref 3.5–5.1)

## 2011-08-19 MED ORDER — WARFARIN SODIUM 2.5 MG PO TABS
2.5000 mg | ORAL_TABLET | Freq: Once | ORAL | Status: AC
  Administered 2011-08-19: 2.5 mg via ORAL
  Filled 2011-08-19: qty 1

## 2011-08-19 NOTE — Progress Notes (Signed)
Patient name: Berklie Dethlefs Griffiths Medical record number: [id number] Date of birth: 1940-10-15 Age: 71 y.o. Gender: female PCP: Lolita Patella, MD, MD  [facility]     Brief patient profile:  8 yowf  underwent a coronary bypass grafting in January, who presented 3/10  with right-sided pleuritic chest pain and dyspnea. She underwent CT scanning which showed a large saddle embolus. She was started on a heparin drip and admitted to the ICU for further evaluation. BNP and troponin were negative in ED. Echocardiogram showed right heart strain and a possible mass in the left atrium.  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     Microbiology/Sepsis markers: None   Anti-infectives    None      Protocols:  None  Consults:   Speech Rx 3/16 Recommend to continue current diet of dysphagia 2 with thin liquids with full supervision with all PO intake to provide cues as needed. Coumadin/pharmacy  Studies: CT angio 3/10 >> large B PE, saddle embolus LE dopplers 3/11 >> prelim positive for B LE DVT >> positive for B DVT IVC filter placed 3/11  Subjective: No specific complaints  Vital signs for last 24 hours: BP 108/63  Pulse 95  Temp(Src) 97.6 F (36.4 C) (Oral)  Resp 18  Ht 5\' 6"  (1.676 m)  Wt 192 lb 7.4 oz (87.3 kg)  BMI 31.06 kg/m2  SpO2 96%  On 3lpm   Intake/Output Summary (Last 24 hours) at 08/19/11 1302 Last data filed at 08/18/11 2227  Gross per 24 hour  Intake    130 ml  Output    600 ml  Net   -470 ml         Physical Exam:  General: No apparent distress. Awake , sitting up in bed  Eyes: Anicteric sclerae. ENT: Oropharynx clear. Moist mucous membranes. No thrush Lymph: No cervical, supraclavicular, or axillary lymphadenopathy. Heart: Normal S1, S2. No murmurs, rubs, or gallops appreciated. No bruits, equal pulses. Lungs: Diminished in the right  base with crackles. Abdomen: Abdomen soft, non-tender and not distended, normoactive bowel sounds. No hepatosplenomegaly or masses. Musculoskeletal: No clubbing or synovitis. Skin: No rashes or lesions Neuro: No focal neurologic deficits.  LABS  Lab 08/19/11 0752 08/18/11 0600 08/17/11 1402  NA 136 138 135  K 3.9 3.6 3.7  CL 103 103 102  CO2 22 27 24   BUN 7 10 13   CREATININE 0.72 0.83 0.80  GLUCOSE 107* 113* 158*    Lab 08/19/11 0752 08/18/11 0600 08/17/11 1402  HGB 11.7* 10.5* 10.9*  HCT 36.6 33.0* 33.4*  WBC 9.7 9.5 10.2  PLT 361 334 335     CXR 3/16 >> no change RLL atx - dextrocardia explains this  Assessment/Plan Active Problems:  Acute respiratory failure with hypoxia  Saddle embolism of pulmonary artery  Pericardial effusion  Atrial mass  Pulmonary Embolism (radiographically demonstrated, spiral CT and without hemodynamic compromise) Bronchiectasis -- heparin drip, coumadin bridge  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 Once she is recovered from her acute illness it may be reasonable to refer her to [facility] for evaluation of this +/- bronchiectasis -continue  IS , flutter valve     Lab 08/19/11 0752 08/18/11 0600 08/17/11 1402 08/16/11 0607 08/15/11 0507  INR 2.22* 1.55* 1.46 1.30 1.21     Dysphagia: -- swallowing eval  3/16 Recommend to continue current diet of dysphagia 2 with thin liquids with full supervision with all PO intake to provide cues as needed.   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 Whether this could be clot given risk for ASD - already anticoagulated - will likely need TEE at some point - dextrocardia puts her at risk for  other congenital abnormalities, but not necessarily for atrial mass   Best practices / Disposition: --> tele floor status -->DNR -->Heparin (treatment) for DVT/PE -->diet   Sandrea Hughs, MD Pulmonary and Critical Care Medicine [facility] Cell [phone]

## 2011-08-19 NOTE — Progress Notes (Signed)
ANTICOAGULATION CONSULT NOTE - Follow Up Consult  Pharmacy Consult for Heparin/Coumadin Indication: pulmonary embolus/DVT  Allergies  Allergen Reactions  . Penicillins Anaphylaxis   Vital Signs: Temp: 97.6 F (36.4 C) (03/17 0636) Temp src: Oral (03/17 0636) BP: 108/63 mmHg (03/17 0636) Pulse Rate: 95  (03/17 0636)  Labs:  Basename 08/19/11 0752 08/18/11 0600 08/17/11 1402  HGB 11.7* 10.5* --  HCT 36.6 33.0* 33.4*  PLT 361 334 335  APTT -- -- --  LABPROT 25.0* 18.9* 18.0*  INR 2.22* 1.55* 1.46  HEPARINUNFRC 0.73* 0.47 0.39  CREATININE 0.72 0.83 0.80  CKTOTAL -- -- --  CKMB -- -- --  TROPONINI -- -- --   Estimated Creatinine Clearance: 73.9 ml/min (by C-G formula based on Cr of 0.72).  Medications:  Heparin @ 1500 units/hr  Assessment: 70yof on day #6 overlap with heparin and coumadin for saddle PE and LLE DVT. Heparin level is slightly supratherapeutic. INR at goal for the first time today, with large increase overnight.  No bleeding noted.  Hgb low, stable.  Plts OK.    Goal of Therapy:  INR 2-3 Heparin level 0.3-0.7   Plan:  1) Decrease heparin slightly to 1400 units/hr 2) Repeat coumadin 2.5mg  x 1 3) Follow up heparin level and INR in AM 4)  If INR>=2 in am, will ask MD to d/c heparin  Jill Side L. Illene Bolus, PharmD, BCPS Clinical Pharmacist Pager: [phone] 08/19/2011 1:56 PM

## 2011-08-20 DIAGNOSIS — J479 Bronchiectasis, uncomplicated: Secondary | ICD-10-CM

## 2011-08-20 DIAGNOSIS — R0609 Other forms of dyspnea: Secondary | ICD-10-CM

## 2011-08-20 DIAGNOSIS — H902 Conductive hearing loss, unspecified: Secondary | ICD-10-CM

## 2011-08-20 DIAGNOSIS — R222 Localized swelling, mass and lump, trunk: Secondary | ICD-10-CM

## 2011-08-20 DIAGNOSIS — I5022 Chronic systolic (congestive) heart failure: Secondary | ICD-10-CM

## 2011-08-20 LAB — PROTIME-INR: Prothrombin Time: 28.7 seconds — ABNORMAL HIGH (ref 11.6–15.2)

## 2011-08-20 LAB — CBC
HCT: 32.7 % — ABNORMAL LOW (ref 36.0–46.0)
Hemoglobin: 10.7 g/dL — ABNORMAL LOW (ref 12.0–15.0)
MCH: 28.7 pg (ref 26.0–34.0)
MCV: 87.7 fL (ref 78.0–100.0)
RBC: 3.73 MIL/uL — ABNORMAL LOW (ref 3.87–5.11)

## 2011-08-20 LAB — GLUCOSE, CAPILLARY
Glucose-Capillary: 109 mg/dL — ABNORMAL HIGH (ref 70–99)
Glucose-Capillary: 116 mg/dL — ABNORMAL HIGH (ref 70–99)
Glucose-Capillary: 97 mg/dL (ref 70–99)
Glucose-Capillary: 112 mg/dL — ABNORMAL HIGH (ref 70–99)

## 2011-08-20 MED ORDER — PANTOPRAZOLE SODIUM 40 MG PO PACK
40.0000 mg | PACK | Freq: Every day | ORAL | Status: DC
  Administered 2011-08-20 – 2011-08-24 (×5): 40 mg
  Filled 2011-08-20 (×6): qty 20

## 2011-08-20 MED ORDER — METOLAZONE 5 MG PO TABS
5.0000 mg | ORAL_TABLET | Freq: Every day | ORAL | Status: DC
  Administered 2011-08-20 – 2011-08-24 (×5): 5 mg via ORAL
  Filled 2011-08-20 (×5): qty 1

## 2011-08-20 MED ORDER — SALINE SPRAY 0.65 % NA SOLN
2.0000 | Freq: Four times a day (QID) | NASAL | Status: DC
  Administered 2011-08-20 – 2011-08-24 (×14): 2 via NASAL
  Filled 2011-08-20 (×2): qty 44

## 2011-08-20 MED ORDER — METOPROLOL TARTRATE 25 MG PO TABS
25.0000 mg | ORAL_TABLET | Freq: Two times a day (BID) | ORAL | Status: DC
  Administered 2011-08-20 – 2011-08-22 (×4): 25 mg via ORAL
  Filled 2011-08-20 (×5): qty 1

## 2011-08-20 MED ORDER — WARFARIN SODIUM 2.5 MG PO TABS
2.5000 mg | ORAL_TABLET | Freq: Once | ORAL | Status: AC
  Administered 2011-08-20: 2.5 mg via ORAL
  Filled 2011-08-20: qty 1

## 2011-08-20 NOTE — Progress Notes (Addendum)
ANTICOAGULATION CONSULT NOTE - Follow Up Consult  Pharmacy Consult for Heparin, Coumadin Indication: pulmonary embolus and DVT  Allergies  Allergen Reactions  . Penicillins Anaphylaxis    Patient Measurements: Height: 5\' 6"  (167.6 cm) Weight: 192 lb 0.3 oz (87.1 kg) IBW/kg (Calculated) : 59.3    Vital Signs: Temp: 98.4 F (36.9 C) (03/18 0700) Temp src: Oral (03/18 0700) BP: 126/67 mmHg (03/18 0700) Pulse Rate: 106  (03/18 0700)  Labs:  Basename 08/20/11 0700 08/19/11 0752 08/18/11 0600 08/17/11 1402  HGB 10.7* 11.7* -- --  HCT 32.7* 36.6 33.0* --  PLT 217 361 334 --  APTT -- -- -- --  LABPROT 28.7* 25.0* 18.9* --  INR 2.65* 2.22* 1.55* --  HEPARINUNFRC 0.67 0.73* 0.47 --  CREATININE -- 0.72 0.83 0.80  CKTOTAL -- -- -- --  CKMB -- -- -- --  TROPONINI -- -- -- --   Estimated Creatinine Clearance: 73.8 ml/min (by C-G formula based on Cr of 0.72).   Medications:  Scheduled:    . allopurinol  200 mg Oral Daily  . aspirin  325 mg Oral Daily  . gabapentin  300 mg Oral BID  . guaiFENesin  5-10 mL Oral TID  . insulin aspart  0-9 Units Subcutaneous TID WC  . pantoprazole  40 mg Oral Q1200  . simvastatin  20 mg Oral q1800  . warfarin  2.5 mg Oral ONCE-1800  . Warfarin - Pharmacist Dosing Inpatient   Does not apply q1800   Infusions:    . sodium chloride 20 mL/hr at 08/15/11 0700  . heparin 1,400 Units/hr (08/19/11 1731)   Anti-infectives    None      Assessment:  70yof on day #7 overlap with heparin and coumadin for saddle PE and LLE DVT.  Day #2 with INR therapeutic (> 2) with Heparin bridge.  Large drop in platelets overnight--  361 >> 217.  No bleeding reported.   Goal of Therapy:   INR 2-3  48 hour overlap of Heparin and INR > 2.   Plan:   Coumadin 2.5 mg po today.  D/C Heparin if confirmed by med team.  Laurena Bering, Pharm.D. 08/20/2011 8:54 AM  Addendum:  Re: Drug-induced Ototoxicity.  I have reviewed this patient's home med  list as well as meds received this admission:  The only agents possibly involved with ototoxicity are:  Lasix--usually high doses given IV at rapid rates.  All loop diuretics have been reported with potential for ototoxicity.  Omnipaque--the incidence of ototoxicity is reported at < 0.1%.   For your information: Major ototoxic agents:  Aminoglycoside antibiotics  Macrolide antibiotics (reversable)  Platinum based chemotherapeutic agents  Vinca alkaloids  Loop diuretics (as above)  Phosphodiesterase-5 enzyme inhibitors (Viagra, Revatio, et.al.)  Vancomycin (rare)  If you have any specific questions, feel free to call with them. Thank you.  Ceyda Peterka, Elisha Headland, Pharm.D. 08/20/2011 4:23 PM

## 2011-08-20 NOTE — Consult Note (Signed)
Cardiology Consult Note   Patient ID: [name] MRN: [id number], DOB/AGE: March 21, 1941   Admit date: 08/12/2011 Date of Consult: 08/20/2011  Primary Physician: Lolita Patella, MD, MD Primary Cardiologist: Earney Hamburg, MD    Pt. Profile: [name] is a 71yo female with PMHx significant for situs inversus totalis (with dextrocardia), CAD (NSTEMI in 12/13 s/p CABG x 3 on 06/14/11 RIMA-LAD, SVG-ramus, SVG-RCA), ischemic cardiomyopathy (EF 06/05/11: 40-45%, anteroseptal/apical hypokinesis and mild LAE), carotid artery disease, HTN, HL and history of DVT who was admitted to [facility] hospital for saddle pulmonary embolism.  Reason for consult: evaluation/management of left atrial mass visualized on TTE  Problem List: Past Medical History  Diagnosis Date  . Arthritis   . Neuropathy   . GERD (gastroesophageal reflux disease)   . Hypertension   . HLD (hyperlipidemia)   . Situs inversus with dextrocardia     CT 06/2011: situs inversus totalis.  What would typically be called RCA arose from anterior sinus of Valsalva.  What would typically be called the left main arises from the posterior sinus of Valsalva and gives rise to a large first diagonal branch and diminutive circumflex  . Dextrocardia   . Fracture 05/24/2011    right; "did not have surgery"  . DVT of leg (deep venous thrombosis) ~2006    left  . Pneumonia 06/13/11    "couple times; long time ago"  . CAD (coronary artery disease)     NSTEMI in setting of gallstone pancreatitis 05/2011:  LHC with 3v CAD; CABG was performed 06/14/11: RIMA-LAD, SVG-ramus, SVG-RCA  . Ischemic cardiomyopathy     Echocardiogram 06/05/11: EF 40-45%, anteroseptal hypokinesis, apical hypokinesis, mild LAE  . Chronic bronchitis   . Anemia   . H/O hiatal hernia   . Chronic systolic heart failure   . Carotid stenosis     Dopplers 06/12/11: LICA 60-79%.    Past Surgical History  Procedure Date  . Ercp 06/03/2011    Procedure: ENDOSCOPIC RETROGRADE  CHOLANGIOPANCREATOGRAPHY (ERCP);  Surgeon: Petra Kuba, MD;  Location: [facility] OR;  Service: Endoscopy;  Laterality: N/A;  . Vaginal hysterectomy 1970's  . Cardiac catheterization 06/11/11  . Coronary artery bypass graft 06/14/2011    Procedure: CORONARY ARTERY BYPASS GRAFTING (CABG);  Surgeon: Alleen Borne, MD;  Location: [facility] OR;  Service: Open Heart Surgery;  Laterality: N/A;     Allergies:  Allergies  Allergen Reactions  . Penicillins Anaphylaxis    HPI:   The patient was recently seen outpatient 08/07/11 with [name]. At that time, she endorsed nausea and anorexia. Her shortness of breath persisted since her CABG, but had not changed in severity. She endorsed associated fatigue and increased LE edema. She had seen her PCP for this and was given low-dose Lasix. he was noted to continue to have sinus tachycardia. The plan was made for repeat echo. A CXR was recommended to assess for pulmonary edema as a source of her dyspnea; however, the patient refused this. She was advised to continue with her current cardiac meds and Lasix. However, a note was made suggesting that if her dyspnea did not improve with diuresis, that she would need a CXR and possibly CT-A chest to rule out PE.   The patient notes becoming more short of breath and fatigued that evening. This progressively worsened. The patient noted increased bilateral leg swelling, R>L with associated tenderness and redness. She endorsed palpitations, but denied chest pain, diaphoresis, lightheadedness, n/v, fevers, chills or cough.   While in  the ED, EKG revealed sinus tachycardia at HR 130s on two separate tracings, no evidence of ST-T wave changes on the latter tracing. CXR revealed small right pleural effusion and bibasilar atelectasis. CT-A chest was ordered revealing extensive pulmonary embolism with dilatation of the RV, situs inversus noted, bibasilar atelectasis, small bilateral pleural effusion, cholelithiasis suspected. She was  diagnosed with bilateral saddle pulmonary emboli with right heart strain, started on heparin and admitted to the ICU by critical care medicine. Of note, CEs neg x 1, pBNP mildly elevated at 528, leukocytosis at 18.2 and normal lactate level.   A 2D echocardiogram was performed (details below). Notably, EF 50-55%, RV dilation with hypokinesis, PA pressure 60mm Hg, possible LA mass, moderate pericardial effusion. This was reported to [name] who suggested possible TEE. There was a suspicion of possible thrombus given risk for ASD with patient's history of dextrocardia. No mention of this on prior echo with contrast on 06/05/11. LE dopplers were ordered revealing acute, occlusive DVT in the posterior tibial vein of the LLE, and indeterminate age DVT involving the RLE. She subsequently underwent infrarenal IVC filter placement without complication. She has progressed slowly. She developed hearing loss of uncertain etiology. She was also noted to have bronchiectasis and there was a suspicion of primary biliary dyskinesia given her history of multiple pneumonias. Today, she was transferred to telemetry.   Home Medications: Prior to Admission medications   Medication Sig Start Date End Date Taking? Authorizing Provider  allopurinol (ZYLOPRIM) 100 MG tablet Take 200 mg by mouth daily.     Yes Historical Provider, MD  aspirin 325 MG tablet Take 325 mg by mouth daily.     Yes Historical Provider, MD  furosemide (LASIX) 20 MG tablet Take 20 mg by mouth daily. . 08/07/11  Yes Historical Provider, MD  gabapentin (NEURONTIN) 300 MG capsule Take 300 mg by mouth 2 (two) times daily.     Yes Historical Provider, MD  metFORMIN (GLUCOPHAGE) 850 MG tablet Take 850 mg by mouth 2 (two) times daily with a meal. 06/25/11 06/24/12 Yes Wayne E Gold, PA  metoprolol (LOPRESSOR) 50 MG tablet Take 50 mg by mouth 2 (two) times daily. 07/10/11 07/09/12 Yes Beatrice Lecher, PA  omeprazole (PRILOSEC) 20 MG capsule Take 20 mg by mouth every  morning.     Yes Historical Provider, MD  simvastatin (ZOCOR) 40 MG tablet Take 20 mg by mouth at bedtime. 06/25/11  Yes Rowe Clack, PA  traMADol (ULTRAM) 50 MG tablet Take 50 mg by mouth every 6 (six) hours as needed. For pain 06/26/11  Yes Historical Provider, MD    Inpatient Medications:     . allopurinol  200 mg Oral Daily  . aspirin  325 mg Oral Daily  . gabapentin  300 mg Oral BID  . guaiFENesin  5-10 mL Oral TID  . insulin aspart  0-9 Units Subcutaneous TID WC  . pantoprazole sodium  40 mg Per Tube Q1200  . simvastatin  20 mg Oral q1800  . sodium chloride  2 spray Each Nare QID  . warfarin  2.5 mg Oral ONCE-1800  . warfarin  2.5 mg Oral ONCE-1800  . Warfarin - Pharmacist Dosing Inpatient   Does not apply q1800  . DISCONTD: pantoprazole  40 mg Oral Q1200   Prescriptions prior to admission  Medication Sig Dispense Refill  . allopurinol (ZYLOPRIM) 100 MG tablet Take 200 mg by mouth daily.        Marland Kitchen aspirin 325 MG tablet Take  325 mg by mouth daily.        . furosemide (LASIX) 20 MG tablet Take 20 mg by mouth daily. .      . gabapentin (NEURONTIN) 300 MG capsule Take 300 mg by mouth 2 (two) times daily.        . metFORMIN (GLUCOPHAGE) 850 MG tablet Take 850 mg by mouth 2 (two) times daily with a meal.      . metoprolol (LOPRESSOR) 50 MG tablet Take 50 mg by mouth 2 (two) times daily.      Marland Kitchen omeprazole (PRILOSEC) 20 MG capsule Take 20 mg by mouth every morning.        . simvastatin (ZOCOR) 40 MG tablet Take 20 mg by mouth at bedtime.      . traMADol (ULTRAM) 50 MG tablet Take 50 mg by mouth every 6 (six) hours as needed. For pain        History reviewed. No pertinent family history.   History   Social History  . Marital Status: Married    Spouse Name: N/A    Number of Children: N/A  . Years of Education: N/A   Occupational History  . Not on file.   Social History Main Topics  . Smoking status: Never Smoker   . Smokeless tobacco: Never Used  . Alcohol Use: No  . Drug  Use: No  . Sexually Active: No   Other Topics Concern  . Not on file   Social History Narrative  . No narrative on file     Review of Systems: General: negative for chills, fever, night sweats or weight changes.  Cardiovascular: positive for edema, orthopnea, PND, palpitations, negative for chest pain Respiratory: positive for shortness of breath, cough- chronic, productive intermittently, negative for wheezing Dermatological: negative for rash Urologic: negative for hematuria Abdominal: negative for nausea, vomiting, diarrhea, bright red blood per rectum, melena, or hematemesis Neurologic: negative for visual changes, syncope, or dizziness All other systems reviewed and are otherwise negative except as noted above.  Physical Exam: Blood pressure 126/67, pulse 106, temperature 98.4 F (36.9 C), temperature source Oral, resp. rate 18, height 5\' 6"  (1.676 m), weight 87.1 kg (192 lb 0.3 oz), SpO2 92.00%.    General: Well developed, well nourished, in no acute distress. Head: Normocephalic, atraumatic, sclera non-icteric, no xanthomas Neck: Negative for carotid bruits. JVD mildly elevated (2-3 cm above clavicle).  Lungs: Mildly tachypneic. Diffuse rales noted. No wheezes or rhonchi. Heart: Tachycardic, regular rhythm with S1 S2. No murmurs, rubs, or gallops appreciated. Abdomen: Soft, non-tender, non-distended with normoactive bowel sounds. No hepatomegaly. No rebound/guarding. No obvious abdominal masses. Msk:  Strength and tone appears normal for age. Extremities: No clubbing, cyanosis. Bilateral pretibial edema noted (R>L), 1+, R calf erythema and tenderness to palpation. Distal pedal pulses are 2+ and equal bilaterally. Neuro: Alert and oriented X 3. Moves all extremities spontaneously. Psych: Responds to questions appropriately with a normal affect.  Labs: Recent Labs  Basename 08/20/11 0700 08/19/11 0752   WBC 10.5 9.7   HGB 10.7* 11.7*   HCT 32.7* 36.6   MCV 87.7 89.5    PLT 217 361    Lab 08/19/11 0752 08/18/11 0600 08/17/11 1402  NA 136 138 135  K 3.9 3.6 3.7  CL 103 103 102  CO2 22 27 24   BUN 7 10 13   CREATININE 0.72 0.83 0.80  CALCIUM 9.1 9.0 8.8  PROT -- -- --  BILITOT -- -- --  ALKPHOS -- -- --  ALT -- -- --  AST -- -- --  AMYLASE -- -- --  LIPASE -- -- --  GLUCOSE 107* 113* 158*    Radiology/Studies: Dg Chest 2 View  08/17/2011  *RADIOLOGY REPORT*  Clinical Data: Shortness of breath, right chest pain, evaluate right effusion  CHEST - 2 VIEW  Comparison: 08/16/2011  Findings: Dextrocardia.  Right lower lobe opacity, likely atelectasis, with associated small right pleural effusion, unchanged.  Left basilar opacity, likely atelectasis.  No pneumothorax.  Sternotomy wires.  IMPRESSION: Right lower lobe opacity, likely atelectasis, with associated stable small right pleural effusion.  Dextrocardia.  Original Report Authenticated By: [name], M.D.   Ct Angio Chest W/cm &/or Wo Cm  08/12/2011  **ADDENDUM** CREATED: 08/12/2011 19:28:02  Critical Value/emergent results were called by telephone at the time of interpretation on 08/12/2011  at 1920 hours  to  Dr. Lendell Caprice, who verbally acknowledged these results.  **END ADDENDUM** SIGNED BY: [name], M.D.    08/12/2011  *RADIOLOGY REPORT*  Clinical Data: Short of breath and chest pain  CT ANGIOGRAPHY CHEST  Technique:  Multidetector CT imaging of the chest using the standard protocol during bolus administration of intravenous contrast. Multiplanar reconstructed images including MIPs were obtained and reviewed to evaluate the vascular anatomy.  Contrast: 80mL OMNIPAQUE IOHEXOL 350 MG/ML IV SOLN  Comparison: 06/12/2011  Findings: A saddle embolus is seen extending across the bifurcation of the main pulmonary artery into both left and right pulmonary artery branches as well as lobe are branches.  This is consistent with extensive pulmonary thromboembolism.  The right ventricle is somewhat dilated with  respect to that of the left ventricle consistent with an increased mortality rate of this condition.  Dextrocardia with situs inversus is again noted.  Small mediastinal nodes.  No pericardial effusion.  Small right pleural effusion.  Bibasilar atelectasis. Right basilar bronchiectasis is noted.  Small hiatal hernia.  Small gallstone is suspected.  IMPRESSION: Extensive pulmonary thromboembolism with dilatation of the right ventricle.  Situs inversus.  Bibasilar atelectasis.  Small platter pleural effusion.  Cholelithiasis is suspected.  Original Report Authenticated By: [name], M.D.   Ir Ivc Filter Plmt / S&i /img Guid/mod Sed  08/15/2011  *RADIOLOGY REPORT*  Clinical data: Bilateral pulmonary emboli.  Situs inversus.  INFERIOR VENACAVOGRAM IVC FILTER PLACEMENT UNDER FLUOROSCOPY  Technique and findings: Patency of the right IJ vein was confirmed with ultrasound with image documentation. An appropriate skin site was determined. Skin site was marked, prepped with chlorhexidine, and draped using maximum barrier technique. The region was infiltrated locally with 1% lidocaine.  Under real-time ultrasound guidance, the right IJ vein was accessed with a 21 gauge micropuncture needle; the needle tip within the vein was confirmed with ultrasound image documentation.   The needle was exchanged over a 018 guidewire for a transitional dilator, which allow advancement of the [facility] wire into the IVC. A long 6 French vascular sheath was placed for inferior venacavography. This demonstrated no caval thrombus. Renal vein inflows were evident.  The Celect IVC filter was advanced through the sheath and successfully deployed under fluoroscopy at the L3 level. Followup cavagram demonstrates stable filter position  and no evident complication. The sheath was removed and hemostasis achieved at the site. No immediate complication.  IMPRESSION: 1.  Normal IVC. No thrombus or significant anatomic variation. 2.  Technically  successful infrarenal IVC filter placement. This is a retrievable model.  Original Report Authenticated By: [name], M.D.   Dg Chest Port 1 View  08/19/2011  *  RADIOLOGY REPORT*  Clinical Data: Situs inversus, coronary disease, pleural effusion  PORTABLE CHEST - 1 VIEW  Comparison: 08/18/2011  Findings: Situs versus noted.  Prior coronary bypass changes. Heart is enlarged with basilar atelectasis and residual right effusion.  No interval change.  No pneumothorax.  IMPRESSION: Stable portable chest exam findings.  Original Report Authenticated By: [name], M.D.   Dg Chest Port 1 View  08/18/2011  *RADIOLOGY REPORT*  Clinical Data: Acute pulmonary embolism this admission.  Situs inversus totalis.  Follow up atelectasis.  PORTABLE CHEST - 1 VIEW 08/18/2011 0804 hours:  Comparison: Two-view chest x-ray yesterday and portable chest x-ray 08/16/2011, 08/13/2011.  CTA chest 08/12/2011.  Findings: Situs inversus.  Prior sternotomy for CABG.  Cardiac silhouette enlarged but stable.  Pulmonary vascularity normal without evidence of pulmonary edema.  Stable right pleural effusion and associated dense consolidation in the right lower lobe. Improved aeration at the left lung base.  No new pulmonary parenchymal abnormalities.  IMPRESSION: Stable right pleural effusion and associated dense atelectasis and/or pneumonia in the right lower lobe.  Improved aeration in the left lung base, with only mild atelectasis persisting.  No new abnormalities.  Original Report Authenticated By: [name], M.D.   Dg Chest Port 1 View  08/16/2011  *RADIOLOGY REPORT*  Clinical Data: Respiratory failure.  Situs inversus.  PORTABLE CHEST - 1 VIEW  Comparison: 08/13/2011  Findings: New small right pleural effusion.  Progressive atelectasis of the right lower lobe.  Heart size and pulmonary vascularity are normal.  IMPRESSION: New small right effusion.  Progressive atelectasis of most of the right lower lobe.   Original Report Authenticated By: [name], M.D.   Portable Chest Xray In Am  08/13/2011  *RADIOLOGY REPORT*  Clinical Data: Pulmonary embolism.  Shortness of breath.  Coronary artery disease.  PORTABLE CHEST - 1 VIEW  Comparison: 08/12/2011  Findings: Dextrocardia again demonstrated with stable cardiomegaly. There is persistent infiltrate or atelectasis in the right retrocardiac lung base.  Left lung is clear.  Prior CABG again noted.  IMPRESSION:  1.  Stable right basilar atelectasis versus infiltrate. 2.  Stable cardiomegaly. 3.  Dextrocardia and situs inversus.  Original Report Authenticated By: [name], M.D.   Dg Chest Portable 1 View  08/12/2011  *RADIOLOGY REPORT*  Clinical Data: Shortness of breath.  PORTABLE CHEST - 1 VIEW  Comparison: 07/17/2011.  Findings: Situs inversus is again noted.  There are surgical changes from bypass surgery.  There is a small right-sided pleural effusion with overlying atelectasis.  Minimal left basilar atelectasis also.  No edema or pneumothorax.  IMPRESSION: Small right effusion and bibasilar atelectasis.  Original Report Authenticated By: [name], M.D.   2D echocardiogram with contrast- 08/12/11  Study Conclusions  - Left ventricle: The cavity size was normal. Systolic function was normal. The estimated ejection fraction was in the range of 50% to 55%. Regional wall motion abnormalities cannot be excluded. - Left atrium: The atrium was mildly dilated. There was an echodense mass in the posterior left atrium. - Right ventricle: The cavity size was dilated. Systolic function was moderately reduced. The estimated peak pressure was = 55mm Hg. - Tricuspid valve: Mild-moderate regurgitation. - Pericardium, extracardiac: A moderate to large pericardial effusion was identified circumferential to the heart. Recommendations: Consider transesophageal echocardiography if clinically indicated in order to exclude  intracardiac thrombus. ------------------------------------------------------------ Labs, prior tests, procedures, and surgery: Coronary artery bypass grafting.  Transthoracic echocardiography. M-mode, complete 2D, spectral Doppler, and color Doppler.  Blood pressure: 97/50. Patient status: Inpatient. Location: Emergency department. ------------------------------------------------------------ ------------------------------------------------------------ Left ventricle: The cavity size was normal. Systolic function was normal. The estimated ejection fraction was in the range of 50% to 55%. Regional wall motion abnormalities cannot be excluded. ------------------------------------------------------------ Aortic valve: Trileaflet; normal thickness leaflets. Mobility was not restricted. Doppler: Transvalvular velocity was within the normal range. There was no stenosis. No regurgitation. ------------------------------------------------------------ Aorta: Aortic root: The aortic root was normal in size. ------------------------------------------------------------ Mitral valve: Structurally normal valve. Mobility was not restricted. Doppler: Transvalvular velocity was within the normal range. There was no evidence for stenosis. No regurgitation. ------------------------------------------------------------ Left atrium: The atrium was mildly dilated. There was an echodense mass in the posterior left atrium. Can't exclude thrombus. ----------------------------------------------------------- Right ventricle: Wall motion is hypokinetic The cavity size was dilated. Systolic function was moderately reduced. The estimated peak pressure was = 55mm Hg. ------------------------------------------------------------ Ventricular septum: Septum mildly hypokinetic with mild paradoxical motion noted. ------------------------------------------------------------ Pulmonic valve: Not  visualized. ------------------------------------------------------------ Tricuspid valve: Structurally normal valve. Doppler: Mild-moderate regurgitation. ------------------------------------------------------------ Right atrium: Not well visualized. The atrium was normal in size. ------------------------------------------------------------ Pericardium: A moderate to large pericardial effusion was identified circumferential to the heart. ------------------------------------------------------------ Systemic veins: Inferior vena cava: Not visualized. ------------------------------------------------- 2D measurements Normal Left ventricle LVID ED, 35.6 mm 43-52 chord, PLAX LVID ES, 21.7 mm 23-38 chord, PLAX FS, chord, 39 % >29 PLAX LVPW, ED 11 mm ------ IVS/LVPW 0.89 <1.3 ratio, ED Ventricular septum IVS, ED 9.8 mm ------ Aorta Root diam, 31 mm ------ ED Left atrium AP dim 40 mm ------  2D echocardiogram with contrast- 06/05/11  Study Conclusions  - Left ventricle: The cavity size was normal. Wall thickness was normal. Systolic function was mildly to moderately reduced. The estimated ejection fraction was in the range of 40% to 45%. There is hypokinesis of the anteroseptal myocardium. There is hypokinesis of the apical myocardium. - Mitral valve: Calcified annulus. - Left atrium: The atrium was mildly dilated. Transthoracic echocardiography. M-mode, complete 2D, spectral Doppler, and color Doppler. Height: Height: 167.6cm. Height: 66in. Weight: Weight: 114.4kg. Weight: 251.7lb. Body mass index: BMI: 40.7kg/m^2. Body surface area: BSA: 2.15m^2. Blood pressure: 134/77. Patient status: Inpatient. Location: Bedside. ------------------------------------------------------------ ------------------------------------------------------------ Left ventricle: The cavity size was normal. Wall thickness was normal. Systolic function was mildly to moderately reduced. The estimated  ejection fraction was in the range of 40% to 45%. Regional wall motion abnormalities: There is hypokinesis of the anteroseptal myocardium. There is hypokinesis of the apical myocardium. ------------------------------------------------------------ Aortic valve: Trileaflet; normal thickness leaflets. Mobility was not restricted. Doppler: Transvalvular velocity was within the normal range. There was no stenosis. No regurgitation. ----------------------------------------------------------- Aorta: Aortic root: The aortic root was normal in size. ------------------------------------------------------------ Mitral valve: Calcified annulus. Mobility was not restricted. Doppler: Transvalvular velocity was within the normal range. There was no evidence for stenosis. Trivial regurgitation. Peak gradient: 4mm Hg (D). ------------------------------------------------------------ Left atrium: The atrium was mildly dilated. ------------------------------------------------------------ Right ventricle: Poorly visualized. The cavity size was normal. Systolic function was normal. ------------------------------------------------------------ Pulmonic valve: Doppler: Transvalvular velocity was within the normal range. There was no evidence for stenosis. ------------------------------------------------------------ Tricuspid valve: Poorly visualized. Doppler: Transvalvular velocity was within the normal range. No regurgitation. ------------------------------------------------------------ Right atrium: Poorly visualized. The atrium was normal in size. ----------------------------------------------------------- Pericardium: There was no pericardial effusion. ------------------------------------------------------------ 2D measurements Normal Doppler measurements Normal Left ventricle Mitral valve LVID ED, 43.8 mm 43-52 Peak E vel 106 cm/s ------ chord, Peak A vel 72. cm/s ------ PLAX 1 LVID ES, 31.5 mm  23-38 Deceleration 144  ms 150-23 chord, time 0 PLAX Peak 4 mm ------ FS, chord, 28 % >29 gradient, D Hg PLAX Peak E/A 1.5 ------ LVPW, ED 7.39 mm ------ ratio IVS/LVPW 1.01 <1.3 ratio, ED Ventricular septum IVS, ED 7.46 mm ------ Aorta Root diam, 24 mm ------ ED Left atrium AP dim 46 mm ------ AP dim 1.95 cm/m^2 <2.2 index  EKG:   08/14/11: sinus tachycardia, 122 bpm, Q waves II, III, aVF, poor R wave progression, no ST-T wave changes 08/12/11: sinus tachycardia, 132 bpm, Q waves, II, III, aVF, poor R wave progression, no ST-T wave changes  ASSESSMENT AND PLAN:   1. Left atrial mass- noted on recent echo on 08/12/11. Upon re-evaluation of a prior echo on 06/05/11, there does appear to be a left atrial mass. However, upon review of cardiac CT done in 01/13, there is no visualization of said abnormality. This may be a benign abnormal structural finding given her unusual cardiac anatomy. DDx would also include thrombus (with ASD), vegetation or myxoma. Patient with history of dextrocardia and certainly would be more at risk for structural cardiac abnormalities such as ASD. She is already on anticoagulation with IVC filter in place. Afebrile, no leukocytosis today, WBC count has trended down since admission. No new murmur on exam. The LA may need to be better visualized.  - Will review prior echo and CT images further and discuss next step  2. CAD- stable; CABG x 3 in 06/14/11. Patient denies chest pain, lightheadedness, palpitations currently or prior to admission. No evidence of recurrent ischemia this admission. She does have LE edema and diffuse rales on exam. Would benefit from diuresis. BUN/Cr WNL.  - Continue current cardiac meds  - Will restart Lopressor 25mg  BID  - Will add Metolazone 5mg  daily given possibility of Lasix ototoxicity   3. Pulmonary emboli/DVT- stable; heparinized and anticoagulated on Coumadin; INR therapeutic also s/p infrarenal IVC filter placement on 03/11.  Patient still dyspneic, with LE edema (R>L). Will diurese as above.      Signed, R. Hurman Horn, PA-C 08/20/2011, 1:09 PM   I have personally seen and examined this patient with Hurman Horn, PA-C. I agree with the assessment and plan as outlined above. She is being anticoagulated for her large PE. She has a left atrial mass which is not prominent on echo from January 2013 but is now seen more clearly on echo from last week. This mass is on the posterior wall of the atrium. Cardiac CT does not show a mass in the left atrium when done in January 2013. Etiology of this mass is not known at this time. This could represent thrombus but this is less likely than primary atrial mass. She will need further imaging. Since her Cardiac CT was not helpful in January, will discuss Cardiac MRI vs TEE with colleagues. A cardiac MRI would most likely define the mass the best. Will restart Lopressor today and add low dose metolazone for diuresis. Will follow with further plans in the am. I have discussed this at length with the patient and her husband.   Kimya Mccahill 3:35 PM 08/20/2011

## 2011-08-20 NOTE — Procedures (Signed)
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  Recommendations:  1. ENT consult 2. Complete audiological evaluation when discharged  If you have any questions, please call [phone].  Rahul Malinak 08/20/2011  4:01 PM

## 2011-08-20 NOTE — Progress Notes (Signed)
Patient name: [name] Medical record number: [id number] Date of birth: [birth year] Age: 71 y.o. Gender: female PCP: Lolita Patella, MD, MD   PCCM     Brief patient profile:  53 yowf  underwent a coronary bypass grafting in January, who presented 3/10  with right-sided pleuritic chest pain and dyspnea. She underwent CT scanning which showed a large saddle embolus. She was started on a heparin drip and admitted to the ICU for further evaluation. BNP and troponin were negative in ED. Echocardiogram showed right heart strain and a possible mass in the left atrium.  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     Microbiology/Sepsis markers: None   Anti-infectives    None      Protocols:  None  Consults:   Speech Rx 3/16 Recommend to continue current diet of dysphagia 2 with thin liquids with full supervision with all PO intake to provide cues as needed. Coumadin/pharmacy  Studies: CT angio 3/10 >> large B PE, saddle embolus LE dopplers 3/11 >> prelim positive for B LE DVT >> positive for B DVT IVC filter placed 3/11  Subjective: Feeling better, wants to walk; complaining of hearing loss for the last several days  Vital signs for last 24 hours: BP 126/67  Pulse 106  Temp(Src) 98.4 F (36.9 C) (Oral)  Resp 18  Ht 5\' 6"  (1.676 m)  Wt 87.1 kg (192 lb 0.3 oz)  BMI 30.99 kg/m2  SpO2 92%     Intake/Output Summary (Last 24 hours) at 08/20/11 1147 Last data filed at 08/20/11 0700  Gross per 24 hour  Intake    240 ml  Output    400 ml  Net   -160 ml       Physical Exam:  Gen: chronically ill appearing, sitting up in chair, NAD HEENT: NCAT, PERRL, EOMi, TM's clear bilaterally with normal light reflex, no buldging PULM: scattered insp crackles, otherwise clear to auscultation CV: RRR, no mgr, no JVD AB: BS+, soft, nontender, no hsm Ext:  warm, edema in R , no clubbing, no cyanosis Derm: no rash or skin breakdown Neuro: A&Ox4, CN II-XII intact, strength 5/5 in all 4 extremities    LABS  Lab 08/19/11 0752 08/18/11 0600 08/17/11 1402  NA 136 138 135  K 3.9 3.6 3.7  CL 103 103 102  CO2 22 27 24   BUN 7 10 13   CREATININE 0.72 0.83 0.80  GLUCOSE 107* 113* 158*    Lab 08/20/11 0700 08/19/11 0752 08/18/11 0600  HGB 10.7* 11.7* 10.5*  HCT 32.7* 36.6 33.0*  WBC 10.5 9.7 9.5  PLT 217 361 334     CXR 3/16 >> no change RLL atx - dextrocardia explains this  Assessment/Plan This is a 71 y/o female with situs inversus who developed a massive PE two months post CABG.  Had right heart strain and residual clot noticed so IVC filter placed.  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 Once she is recovered from her acute illness  it may be reasonable to refer her to [facility] for evaluation of this +/- bronchiectasis -continue  IS , flutter valve    Lab 08/20/11 0700 08/19/11 0752 08/18/11 0600 08/17/11 1402 08/16/11 0607  INR 2.65* 2.22* 1.55* 1.46 1.30     Dysphagia: -- swallowing eval  3/16 Recommend to continue current diet of dysphagia 2 with thin liquids with full supervision with all PO intake to provide cues as needed.   DM - SSI protocol - restart metformin 3/19  L atrial mass on TTE, ? Whether this could be clot given risk for ASD - already anticoagulated - discuss with [facility] cardiology (followed by [facility])   Hearing Loss: uncertain etiology, no  obvious meds on regimen now which may have caused this, but did receive lasix prior, TM's clear on exam Plan: -Audiology testing -discuss with pharmacy -start sinus regimen (saline)  Best practices / Disposition: --> tele floor status -->DNR -->Heparin (treatment) for DVT/PE -->diet   Yolonda Kida PCCM Pager: [phone] If no response, call [phone]

## 2011-08-20 NOTE — Progress Notes (Signed)
Speech Pathology: Dysphagia Treatment Note  Subjective: Awake, alert, husband present  Objective: Treatment focused on assessment of diet tolerance (Dys.2/thin liquids) which revealed no difficulties.  Patient on 2 liters nasal cannula with a significant improvement in respiration and coordination of breathing/swallowing.  Mechanical soft trial (fruit cup) without dentures in place was [facility], although patient reported wanting to place dentures once denture adhesive available (husband to bring this in) as this will make mastication easier.  Patient also hesitant to wear dentures as they have been very uncomfortable and food has been getting caught under plate causing her to gag. At this point, patient's need for texture modification is solely due to her intolerance of dentures.  Assessment: Demonstrates a functional oral-pharyngeal swallow with continued c/o esophageal based issues ("I'm getting so full so fast, like it's not fully going down.").    Recommendations:  1.  Upgrade diet to Dys.3 (mechanical soft, ground meats) and Thin liquids 2.  MD to consider barium swallow exam as respiratory status more stable (per Dr. Kavin Leech plan). 3.  Will f/u 1x to ensure diet texture is being tolerated.  Pain:   none Intervention Required:   No  Goals: Goals Partially Met  Myra Rude, M.S.,CCC-SLP Pager [phone]

## 2011-08-21 ENCOUNTER — Inpatient Hospital Stay (HOSPITAL_COMMUNITY): Payer: Medicare Other

## 2011-08-21 DIAGNOSIS — I5189 Other ill-defined heart diseases: Secondary | ICD-10-CM

## 2011-08-21 DIAGNOSIS — I2699 Other pulmonary embolism without acute cor pulmonale: Secondary | ICD-10-CM

## 2011-08-21 DIAGNOSIS — J479 Bronchiectasis, uncomplicated: Secondary | ICD-10-CM

## 2011-08-21 DIAGNOSIS — H902 Conductive hearing loss, unspecified: Secondary | ICD-10-CM

## 2011-08-21 DIAGNOSIS — J9600 Acute respiratory failure, unspecified whether with hypoxia or hypercapnia: Secondary | ICD-10-CM

## 2011-08-21 DIAGNOSIS — I2510 Atherosclerotic heart disease of native coronary artery without angina pectoris: Secondary | ICD-10-CM

## 2011-08-21 LAB — CBC
Hemoglobin: 10.9 g/dL — ABNORMAL LOW (ref 12.0–15.0)
MCHC: 31.9 g/dL (ref 30.0–36.0)
Platelets: 349 10*3/uL (ref 150–400)
RBC: 3.82 MIL/uL — ABNORMAL LOW (ref 3.87–5.11)

## 2011-08-21 LAB — BASIC METABOLIC PANEL
BUN: 7 mg/dL (ref 6–23)
Chloride: 105 mEq/L (ref 96–112)
Creatinine, Ser: 0.84 mg/dL (ref 0.50–1.10)
GFR calc Af Amer: 80 mL/min — ABNORMAL LOW (ref >90)
GFR calc non Af Amer: 69 mL/min — ABNORMAL LOW (ref >90)
Potassium: 3.3 mEq/L — ABNORMAL LOW (ref 3.5–5.1)

## 2011-08-21 LAB — PROTIME-INR
INR: 2.64 — ABNORMAL HIGH (ref 0.00–1.49)
Prothrombin Time: 28.6 seconds — ABNORMAL HIGH (ref 11.6–15.2)

## 2011-08-21 LAB — HEPARIN LEVEL (UNFRACTIONATED): Heparin Unfractionated: 0.6 IU/mL (ref 0.30–0.70)

## 2011-08-21 LAB — GLUCOSE, CAPILLARY: Glucose-Capillary: 126 mg/dL — ABNORMAL HIGH (ref 70–99)

## 2011-08-21 MED ORDER — GADOBENATE DIMEGLUMINE 529 MG/ML IV SOLN
30.0000 mL | Freq: Once | INTRAVENOUS | Status: AC
  Administered 2011-08-21: 30 mL via INTRAVENOUS

## 2011-08-21 MED ORDER — POTASSIUM CHLORIDE CRYS ER 20 MEQ PO TBCR
40.0000 meq | EXTENDED_RELEASE_TABLET | Freq: Every day | ORAL | Status: DC
  Administered 2011-08-21 – 2011-08-24 (×3): 40 meq via ORAL
  Filled 2011-08-21 (×5): qty 2

## 2011-08-21 MED ORDER — DIAZEPAM 5 MG PO TABS
10.0000 mg | ORAL_TABLET | Freq: Three times a day (TID) | ORAL | Status: DC | PRN
  Administered 2011-08-21: 10 mg via ORAL
  Filled 2011-08-21: qty 2

## 2011-08-21 MED ORDER — WARFARIN SODIUM 2.5 MG PO TABS
2.5000 mg | ORAL_TABLET | Freq: Every day | ORAL | Status: AC
  Administered 2011-08-21: 2.5 mg via ORAL
  Filled 2011-08-21: qty 1

## 2011-08-21 NOTE — Progress Notes (Signed)
   CARE MANAGEMENT NOTE 08/21/2011  Patient:  [name], [name]   Account Number:  [id number]  Date Initiated:  08/21/2011  Documentation initiated by:  [facility]  Subjective/Objective Assessment:   Saddle PE's, bilat DVT's.  Has husband     Action/Plan:   PTA, PT LIVES WITH HUSBAND; PMH OF CABG IN [place] OF 2013.   Anticipated DC Date:  08/24/2011   Anticipated DC Plan:  SKILLED NURSING FACILITY  In-house referral  Clinical Social Worker      DC Planning Services  CM consult      Choice offered to / List presented to:             Status of service:  In process, will continue to follow Medicare Important Message given?   (If response is "NO", the following Medicare IM given date fields will be blank) Date Medicare IM given:   Date Additional Medicare IM given:    Discharge Disposition:    Per UR Regulation:  Reviewed for med. necessity/level of care/duration of stay  If discussed at Long Length of Stay Meetings, dates discussed:    Comments:  08/21/11 Stefhanie Kachmar,RN,BSN 1100 MET WITH PT AND HUSBAND TO DISCUSS DC PLANS.  PT STATES SHE IS PROBABLY GOING TO NEED SOME REHAB PRIOR TO RETURNING HOME, AS SHE FEELS THAT SHE WILL HAVE TO COME RIGHT BACK TO THE HOSPITAL IF SHE DOESN'T.  SHE PREFERS SHORT TERM SNF AT [facility] IN [place], IF POSSIBLE, AS IT IS NEAR HER HOME.  WILL CONSULT CSW TO FACILITATE DC TO SNF WHEN MEDICALLY STABLE.  WILL FOLLOW. Phone #[phone]

## 2011-08-21 NOTE — Progress Notes (Signed)
ANTICOAGULATION CONSULT NOTE - Follow Up Consult  Pharmacy Consult for Coumadin, Heparin bridging Indication: pulmonary embolus and DVT  Allergies  Allergen Reactions  . Penicillins Anaphylaxis    Patient Measurements: Height: 5\' 6"  (167.6 cm) Weight: 194 lb (87.998 kg) IBW/kg (Calculated) : 59.3    Vital Signs: Temp: 97.7 F (36.5 C) (03/19 0612) Temp src: Oral (03/19 0612) BP: 131/77 mmHg (03/19 0612) Pulse Rate: 112  (03/19 0753)  Labs:  Basename 08/21/11 0500 08/20/11 0700 08/19/11 0752  HGB 10.9* 10.7* --  HCT 34.2* 32.7* 36.6  PLT 349 217 361  APTT -- -- --  LABPROT 28.6* 28.7* 25.0*  INR 2.64* 2.65* 2.22*  HEPARINUNFRC 0.60 0.67 0.73*  CREATININE 0.84 -- 0.72  CKTOTAL -- -- --  CKMB -- -- --  TROPONINI -- -- --   Estimated Creatinine Clearance: 70.6 ml/min (by C-G formula based on Cr of 0.84).   Medications:  Scheduled:    . allopurinol  200 mg Oral Daily  . aspirin  325 mg Oral Daily  . gabapentin  300 mg Oral BID  . guaiFENesin  5-10 mL Oral TID  . insulin aspart  0-9 Units Subcutaneous TID WC  . metolazone  5 mg Oral Daily  . metoprolol tartrate  25 mg Oral BID  . pantoprazole sodium  40 mg Per Tube Q1200  . simvastatin  20 mg Oral q1800  . sodium chloride  2 spray Each Nare QID  . warfarin  2.5 mg Oral ONCE-1800  . Warfarin - Pharmacist Dosing Inpatient   Does not apply q1800  . DISCONTD: pantoprazole  40 mg Oral Q1200   Infusions:    . sodium chloride 10 mL/hr at 08/20/11 1400  . heparin 1,400 Units/hr (08/21/11 0600)    Assessment:  70yof on day #8 overlap with heparin and coumadin for saddle PE and LLE DVT.  Day #3 with INR therapeutic (> 2) with Heparin bridge.  INR  2.64  Platelets up today to > 300k  Patient has a left atrial mass deemed by cardiology to be more likely a primary atrial mass vs thrombus.  Cardiology recommends stopping Heparin.  Goal of Therapy:   INR 2-3   Plan:   Will stop Heparin infusion.  Coumadin  2.5 mg po daily.  Shaquanda Graves, Elisha Headland, Pharm.D. 08/21/2011 8:39 AM

## 2011-08-21 NOTE — Evaluation (Signed)
Physical Therapy Evaluation Patient Details Name: [name] MRN: [id number] DOB: 1941-05-21 Today's Date: 08/21/2011  Problem List:  Patient Active Problem List  Diagnoses  . Acute myocardial infarction, subendocardial infarction, subsequent episode of care  . Chronic systolic heart failure  . Acute renal failure  . Hypokalemia  . Hypomagnesemia  . Dextrocardia  . CAD  . Cough  . DM2 (diabetes mellitus, type 2)  . HLD (hyperlipidemia)  . Carotid stenosis  . Dyspnea  . Acute respiratory failure with hypoxia  . Saddle embolism of pulmonary artery  . Pericardial effusion  . Atrial mass    Past Medical History:  Past Medical History  Diagnosis Date  . Arthritis   . Neuropathy   . GERD (gastroesophageal reflux disease)   . Hypertension   . HLD (hyperlipidemia)   . Situs inversus with dextrocardia     CT 06/2011: situs inversus totalis.  What would typically be called RCA arose from anterior sinus of Valsalva.  What would typically be called the left main arises from the posterior sinus of Valsalva and gives rise to a large first diagonal branch and diminutive circumflex  . Dextrocardia   . Fracture 05/24/2011    right; "did not have surgery"  . DVT of leg (deep venous thrombosis) ~2006    left  . Pneumonia 06/13/11    "couple times; long time ago"  . CAD (coronary artery disease)     NSTEMI in setting of gallstone pancreatitis 05/2011:  LHC with 3v CAD; CABG was performed 06/14/11: RIMA-LAD, SVG-ramus, SVG-RCA  . Ischemic cardiomyopathy     Echocardiogram 06/05/11: EF 40-45%, anteroseptal hypokinesis, apical hypokinesis, mild LAE  . Chronic bronchitis   . Anemia   . H/O hiatal hernia   . Chronic systolic heart failure   . Carotid stenosis     Dopplers 06/12/11: LICA 60-79%.   Past Surgical History:  Past Surgical History  Procedure Date  . Ercp 06/03/2011    Procedure: ENDOSCOPIC RETROGRADE CHOLANGIOPANCREATOGRAPHY (ERCP);  Surgeon: Petra Kuba, MD;  Location: [facility]  OR;  Service: Endoscopy;  Laterality: N/A;  . Vaginal hysterectomy 1970's  . Cardiac catheterization 06/11/11  . Coronary artery bypass graft 06/14/2011    Procedure: CORONARY ARTERY BYPASS GRAFTING (CABG);  Surgeon: Alleen Borne, MD;  Location: [facility] OR;  Service: Open Heart Surgery;  Laterality: N/A;    PT Assessment/Plan/Recommendation PT Assessment H&P: [name] is a 71 year old lady who underwent a coronary bypass grafting in January, who presented today with right-sided pleuritic chest pain and dyspnea. She underwent CT scanning which showed a large saddle embolus. She was started on a heparin drip and admitted to the ICU for further evaluation. BNP and troponin were negative in ED. Echocardiogram showed right heart strain and a possible mass in the left atrium. Patient also with a h/o situs inversus totalis, multiple PNAs, chronic lung disease and bonchiectasis on CT. Clinical Impression Statement:  Presents to PT for evaluation today moving relatively well however with generalized weakness and increased difficulty with mobility secondary to RLE pain and swelling. Will benefit from physical therapy in the acute setting to address the below impairments and to maximize mobility and independence for safe d/c home. Rec. HHPT for f/u at this time. If pt progresses well she many not need any follow up. No DME needs.  PT Recommendation/Assessment: Patient will need skilled PT in the acute care venue PT Problem List: Decreased strength;Decreased activity tolerance;Decreased balance;Decreased mobility;Pain;Obesity;Cardiopulmonary status limiting activity PT Therapy Diagnosis : Difficulty  walking;Abnormality of gait;Generalized weakness;Acute pain PT Plan PT Frequency: Min 3X/week PT Treatment/Interventions: DME instruction;Gait training;Stair training;Functional mobility training;Therapeutic activities;Therapeutic exercise;Balance training;Cognitive remediation;Neuromuscular re-education;Patient/family  education PT Recommendation Follow Up Recommendations: Home health PT Equipment Recommended: None recommended by PT PT Goals  Acute Rehab PT Goals PT Goal Formulation: With patient Time For Goal Achievement: 2 weeks Pt will go Supine/Side to Sit: with HOB 0 degrees;with modified independence PT Goal: Supine/Side to Sit - Progress: Goal set today Pt will go Sit to Supine/Side: with modified independence;with HOB 0 degrees PT Goal: Sit to Supine/Side - Progress: Goal set today Pt will go Sit to Stand: with modified independence PT Goal: Sit to Stand - Progress: Goal set today Pt will go Stand to Sit: with modified independence PT Goal: Stand to Sit - Progress: Goal set today Pt will Transfer Bed to Chair/Chair to Bed: with modified independence PT Transfer Goal: Bed to Chair/Chair to Bed - Progress: Goal set today Pt will Stand: Independently with no upper extremity support x 5-6 min PT Goal: Stand - Progress: Goal set today Pt will Ambulate: >150 feet;with modified independence;with least restrictive assistive device PT Goal: Ambulate - Progress: Goal set today Pt will Go Up / Down Stairs: 6-9 stairs;with min assist;with least restrictive assistive device PT Goal: Up/Down Stairs - Progress: Goal set today Pt will Perform Home Exercise Program: Independently PT Goal: Perform Home Exercise Program - Progress: Goal set today  PT Evaluation Precautions/Restrictions  Precautions Precautions: Fall Prior Functioning  Home Living Lives With: Spouse Receives Help From: Family Type of Home: House Home Layout: One level Home Access: Stairs to enter Entrance Stairs-Rails: Doctor, general practice of Steps: 6 Home Adaptive Equipment: Walker - rolling Prior Function Level of Independence: Independent with basic ADLs;Independent with homemaking with ambulation;Needs assistance with homemaking Driving: No Vocation: Retired Comments: since last admission pt and husband managing  ok, husband preparing meals and driving, reports she was able to get to and from doctors appointments ok but not really going out into the community otherwise,  pt reporting she was able to walk household distances (approx 150 ft or so without an assistive device) Cognition Cognition Arousal/Alertness: Awake/alert Overall Cognitive Status: Appears within functional limits for tasks assessed Orientation Level: Oriented X4 Sensation/Coordination Sensation Light Touch: Appears Intact Coordination Gross Motor Movements are Fluid and Coordinated: Yes Fine Motor Movements are Fluid and Coordinated: Yes Extremity Assessment RUE Assessment RUE Assessment:  (generally weak, grossly 4/5) LUE Assessment LUE Assessment:  (generally weak; grossly 4/5) RLE AROM (degrees) RLE Overall AROM Comments: grossly limited knee flexion and dorsiflexion secondary to swelling and pain (DVT) RLE Strength RLE Overall Strength Comments: grossly 4/5 LLE Assessment LLE Assessment: Within Functional Limits Mobility (including Balance) Bed Mobility Bed Mobility: Yes Supine to Sit: 6: Modified independent (Device/Increase time);HOB elevated (Comment degrees) (30 degrees) Sitting - Scoot to Edge of Bed: 6: Modified independent (Device/Increase time) Transfers Transfers: Yes Sit to Stand: 5: Supervision;From bed;With upper extremity assist;From chair/3-in-1 Stand to Sit: 5: Supervision;To chair/3-in-1;With upper extremity assist Stand to Sit Details: cues for safe hand placement (pt sat on 3in1 and then back in recliner following ambulation); more difficulty controlling descent to lower surface (recliner) despite use of upper extremities to assist Ambulation/Gait Ambulation/Gait: Yes Ambulation/Gait Assistance: 4: Min assist Ambulation/Gait Assistance Details (indicate cue type and reason): mingaurdA for ambulation with RW; slow antalgic gait with decreased weight shift to right and decreased hip/knee/dorsiflexion on  right secondary to pain and swelling; pt ambulated on 3 liters and sats  dropped to mid 80s needing cues to cease talking while walking and focus on controlled breathing techniques Ambulation Distance (Feet): 96 Feet Assistive device: Rolling walker  Posture/Postural Control Posture/Postural Control: No significant limitations Balance Balance Assessed: Yes Static Standing Balance Static Standing - Balance Support: No upper extremity supported Static Standing - Level of Assistance: 5: Stand by assistance Static Standing - Comment/# of Minutes: weight shifted to LLE in static standing while pt washing hands  Exercise    End of Session PT - End of Session Equipment Utilized During Treatment: Gait belt Activity Tolerance: Patient tolerated treatment well (increased DOE, sats dropping to mid 80s on 3 liters ) Patient left: in chair;with call bell in reach General Behavior During Session: [facility] for tasks performed Cognition: [facility] for tasks performed  [facility] HELEN 08/21/2011, 8:49 AM

## 2011-08-21 NOTE — Progress Notes (Signed)
Patient name: [name] Medical record number: [id number] Date of birth: 1941-04-29 Age: 71 y.o. Gender: female PCP: Lolita Patella, MD, MD  [place] [facility]     Brief patient profile:  38 yowf  underwent a coronary bypass grafting in January, who presented 3/10  with right-sided pleuritic chest pain and dyspnea. She underwent CT scanning which showed a large saddle embolus. She was started on a heparin drip and admitted to the ICU for further evaluation. BNP and troponin were negative in ED. Echocardiogram showed right heart strain and a possible mass in the left atrium.  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     Microbiology/Sepsis markers: None   Anti-infectives    None      Protocols:  None  Consults: [facility] cardiology Speech Rx 3/16 Recommend to continue current diet of dysphagia 2 with thin liquids with full supervision with all PO intake to provide cues as needed. --advanced to Dysphagia 3 on 3/19 Coumadin/pharmacy Audiology 3/18 for hearing loss: bilateral mixed hearing loss  Studies: CT angio 3/10 >> large B PE, saddle embolus LE dopplers 3/11 >> prelim positive for B LE DVT >> positive for B DVT IVC filter placed 3/11  Subjective: Seen by cardiology yesterday, they added back home metoprolol, added metolazone, and ordered a cardiac MRI for the mass; still somewhat short of breath when getting out of bed, minimal activity; hearing improved  Vital signs for last 24 hours: BP 131/77  Pulse 112  Temp(Src) 97.7 F (36.5 C) (Oral)  Resp 20  Ht 5\' 6"  (1.676 m)  Wt 87.998 kg (194 lb)  BMI 31.31 kg/m2  SpO2 91%     Intake/Output Summary (Last 24 hours) at 08/21/11 1033 Last data filed at 08/21/11 0618  Gross per 24 hour  Intake    168 ml  Output   1250 ml  Net  -1082 ml       Physical Exam:  Gen: chronically ill appearing, sitting up in  chair, NAD HEENT: NCAT, PERRL, EOMi, TM's clear bilaterally with normal light reflex, no buldging PULM: insp crackles left base CV: RRR, no mgr, no JVD AB: BS+, soft, nontender, no hsm Ext: warm, edema in R , no clubbing, no cyanosis Derm: no rash or skin breakdown Neuro: A&Ox4, CN II-XII intact, strength 5/5 in all 4 extremities    LABS  Lab 08/21/11 0500 08/19/11 0752 08/18/11 0600  NA 141 136 138  K 3.3* 3.9 3.6  CL 105 103 103  CO2 27 22 27   BUN 7 7 10   CREATININE 0.84 0.72 0.83  GLUCOSE 110* 107* 113*    Lab 08/21/11 0500 08/20/11 0700 08/19/11 0752  HGB 10.9* 10.7* 11.7*  HCT 34.2* 32.7* 36.6  WBC 7.3 10.5 9.7  PLT 349 217 361     CXR 3/16 >> no change RLL atx - dextrocardia explains this  Assessment/Plan This is a 71 y/o female with situs inversus who developed a massive PE two months post CABG.  Had right heart strain and residual clot noticed so IVC filter placed.  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 Once she is recovered from her acute illness it may be reasonable to refer her to [facility] for evaluation of this +/- bronchiectasis -continue  IS , flutter valve    Lab 08/21/11 0500 08/20/11 0700 08/19/11 0752 08/18/11 0600 08/17/11 1402  INR 2.64* 2.65* 2.22* 1.55* 1.46     CHF: volume up on exam -- appreciate cardiology management   Dysphagia: -- swallowing eval  3/16 Recommend to continue current diet of dysphagia 3 with thin liquids with full supervision with all PO intake to provide  cues as needed. 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 Whether this could be clot given risk for ASD - already anticoagulated - appreciate cardiology input - MRI ordered today  Hearing Loss: uncertain etiology, but appears to be improving with treatment of sinuses; appreciate audiology input; no clear ototoxic meds per pharmacy Plan: -ENT consult if does not return to baseline -continue sinus regimen (saline)  Best practices / Disposition: --> tele floor status --> consult social work for placement, consider inpatient rehab at [facility] if possible -->DNR -->Heparin (treatment) for DVT/PE -->diet   Yolonda Kida PCCM Pager: [phone] If no response, call [phone]

## 2011-08-21 NOTE — Progress Notes (Signed)
    SUBJECTIVE: No chest pain. No change in breathing. Still with SOB.   BP 131/77  Pulse 91  Temp(Src) 97.7 F (36.5 C) (Oral)  Resp 20  Ht 5\' 6"  (1.676 m)  Wt 194 lb (87.998 kg)  BMI 31.31 kg/m2  SpO2 93%  Intake/Output Summary (Last 24 hours) at 08/21/11 0743 Last data filed at 08/21/11 1610  Gross per 24 hour  Intake    168 ml  Output   1250 ml  Net  -1082 ml    PHYSICAL EXAM General: Well developed, well nourished, in no acute distress. Alert and oriented x 3.  Psych:  Good affect, responds appropriately Neck: No JVD. No masses noted.  Lungs: Clear bilaterally with no wheezes or rhonci noted.  Heart: RRR with no murmurs noted. Abdomen: Bowel sounds are present. Soft, non-tender.  Extremities: 2+ right lower extremity edema. 1_ left lower ext edema.   LABS: Basic Metabolic Panel:  Basename 08/19/11 0752  NA 136  K 3.9  CL 103  CO2 22  GLUCOSE 107*  BUN 7  CREATININE 0.72  CALCIUM 9.1  MG --  PHOS --   CBC:  Basename 08/21/11 0500 08/20/11 0700  WBC 7.3 10.5  NEUTROABS -- --  HGB 10.9* 10.7*  HCT 34.2* 32.7*  MCV 89.5 87.7  PLT 349 217    Current Meds:    . allopurinol  200 mg Oral Daily  . aspirin  325 mg Oral Daily  . gabapentin  300 mg Oral BID  . guaiFENesin  5-10 mL Oral TID  . insulin aspart  0-9 Units Subcutaneous TID WC  . metolazone  5 mg Oral Daily  . metoprolol tartrate  25 mg Oral BID  . pantoprazole sodium  40 mg Per Tube Q1200  . simvastatin  20 mg Oral q1800  . sodium chloride  2 spray Each Nare QID  . warfarin  2.5 mg Oral ONCE-1800  . Warfarin - Pharmacist Dosing Inpatient   Does not apply q1800  . DISCONTD: pantoprazole  40 mg Oral Q1200     ASSESSMENT AND PLAN:  1. 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 This mass is on the posterior wall of the atrium. Cardiac CT does not show a mass in the left atrium when done  in January 2013. Etiology of this mass is not known at this time. This could represent thrombus but this is less likely than primary atrial mass. She will need further imaging with cardiac MRI today. Since her Cardiac CT was not helpful in January, will discuss Cardiac MRI vs TEE with colleagues. A cardiac MRI would most likely define the mass the best.  2. CAD: Stable. Continue current cardiac meds. Lopressor restarted yesterday.   3. Volume overload: Likely combination of right sided failure from RV strain in setting of large PE and diastolic failure. Added Metolazone 5mg  daily yesterday given possibility of Lasix ototoxicity. Diuresing well.   4. Pulmonary emboli/DVT- stable; anticoagulated on Coumadin; INR therapeutic also s/p infrarenal IVC filter placement on 03/11. Would stop heparin. Patient still dyspneic, with LE edema (R>L). Will diurese as above.      [name]  3/19/20137:43 AM

## 2011-08-21 NOTE — Progress Notes (Signed)
Clinical Social Work Department CLINICAL SOCIAL WORK PLACEMENT NOTE 08/21/2011  Patient:  [name], [name]  Account Number:  [id number] Admit date:  08/12/2011  Clinical Social Worker:  Margaree Mackintosh  Date/time:  08/21/2011 03:00 PM  Clinical Social Work is seeking post-discharge placement for this patient at the following level of care:   SKILLED NURSING   (*CSW will update this form in Epic as items are completed)     Patient/family provided with [facility] Department of Clinical Social Work's list of facilities offering this level of care within the geographic area requested by the patient (or if unable, by the patient's family).  08/21/2011  Patient/family informed of their freedom to choose among providers that offer the needed level of care, that participate in Medicare, Medicaid or managed care program needed by the patient, have an available bed and are willing to accept the patient.  08/21/2011  Patient/family informed of [facility]' ownership interest in [facility], as well as of the fact that they are under no obligation to receive care at this facility.  PASARR submitted to EDS on 08/21/2011 PASARR number received from EDS on   FL2 transmitted to all facilities in geographic area requested by pt/family on  08/21/2011 FL2 transmitted to all facilities within larger geographic area on   Patient informed that his/her managed care company has contracts with or will negotiate with  certain facilities, including the following:     Patient/family informed of bed offers received:   Patient chooses bed at  Physician recommends and patient chooses bed at    Patient to be transferred to  on   Patient to be transferred to facility by   The following physician request were entered in Epic:   Additional Comments: Pt family interested in [place] or [place] area.  Angelia Mould, MSW, [place] [phone]

## 2011-08-21 NOTE — Progress Notes (Signed)
Speech Pathology: Dysphagia Treatment Note  Patient was observed with : Mechanical Soft / Ground and Thin liquids.  Lung Sounds:  Clear upper, fine crackles lower Temperature: 97.7  Patient required: modified independence to consistently follow precautions/strategies  Clinical Impression: Pt was seen by SLP for skilled observation of diet tolerance. Pt presented with a delayed, intermittent volitional cough; suspect that this is secondary to globus sensation, given that the cough followed her c/o something feeling "stuck" and her oropharyngeal swallow still appears to be [facility]. Overall, the pt seems to be tolerating her current diet and protecting her airway. She is using her strategies and reflux precautions with modified independence, and her complaints appear to be esophageal based. Per Dr. Delton Coombes 3/15, a barium swallow will be needed to assess for an esophageal dysphagia when her O2 needs improve. SLP provided education regarding reflux precautions and diet recommendation. Given a suspected primary esophageal component, no further SLP services are needed at this time.  Recommendations:  1. Continue current diet with reflux precautions 2. Per Dr. Delton Coombes (3/15), MD to consider barium swallow exam as O2 needs improve 3. No further SLP services are needed at this time  Pain:   none Intervention Required:   No  Goals: Goals Met   Maxcine Ham, SLP Student

## 2011-08-21 NOTE — Progress Notes (Signed)
Clinical Social Work Department BRIEF PSYCHOSOCIAL ASSESSMENT 08/21/2011  Patient:  KATHYJO, BRIERE     Account Number:  [id number]     Admit date:  08/12/2011  Clinical Social Worker:  Margaree Mackintosh  Date/Time:  08/21/2011 03:00 PM  Referred by:  Physician  Date Referred:  08/21/2011 Referred for  SNF Placement   Other Referral:   Interview type:  Family Other interview type:    PSYCHOSOCIAL DATA Living Status:  HUSBAND Admitted from facility:   Level of care:   Primary support name:  Chrissie Noa Primary support relationship to patient:  SPOUSE Degree of support available:   adequate    CURRENT CONCERNS Current Concerns  Post-Acute Placement   Other Concerns:    SOCIAL WORK ASSESSMENT / PLAN Clinical Social Worker met with pt's husband in pt's room, pt currently out of room in a procedure.  CSW introduced self and explained role.  CSW confirmed plan for SNF.  CSW provided opportunity for pt's spouse to process concerns/questions.  CSW reviewed process for SNF placement.  CSW submitted information to requested geographical area.   Assessment/plan status:  Information/Referral to [facility] Other assessment/ plan:   Information/referral to community resources:   SNF    PATIENT'S/FAMILY'S RESPONSE TO PLAN OF CARE: Spouse in agreement with plan.  CSW to follow up with pt once she is able to fully participate with conversation.    Angelia Mould, MSW, [place] [phone]

## 2011-08-22 DIAGNOSIS — I2510 Atherosclerotic heart disease of native coronary artery without angina pectoris: Secondary | ICD-10-CM

## 2011-08-22 LAB — BASIC METABOLIC PANEL
BUN: 7 mg/dL (ref 6–23)
CO2: 29 mEq/L (ref 19–32)
Chloride: 101 mEq/L (ref 96–112)
Creatinine, Ser: 0.88 mg/dL (ref 0.50–1.10)

## 2011-08-22 LAB — GLUCOSE, CAPILLARY
Glucose-Capillary: 106 mg/dL — ABNORMAL HIGH (ref 70–99)
Glucose-Capillary: 115 mg/dL — ABNORMAL HIGH (ref 70–99)

## 2011-08-22 LAB — CBC
HCT: 36.2 % (ref 36.0–46.0)
MCH: 28.5 pg (ref 26.0–34.0)
MCV: 91.2 fL (ref 78.0–100.0)
RBC: 3.97 MIL/uL (ref 3.87–5.11)
WBC: 6.4 10*3/uL (ref 4.0–10.5)

## 2011-08-22 MED ORDER — METOPROLOL TARTRATE 50 MG PO TABS
50.0000 mg | ORAL_TABLET | Freq: Two times a day (BID) | ORAL | Status: DC
  Administered 2011-08-22 – 2011-08-24 (×4): 50 mg via ORAL
  Filled 2011-08-22 (×5): qty 1

## 2011-08-22 MED ORDER — WARFARIN SODIUM 5 MG PO TABS
5.0000 mg | ORAL_TABLET | Freq: Once | ORAL | Status: AC
  Administered 2011-08-22: 5 mg via ORAL
  Filled 2011-08-22: qty 1

## 2011-08-22 NOTE — Progress Notes (Signed)
Patient name: [name] Medical record number: [id number] Date of birth: 03/03/1941 Age: 71 y.o. Gender: female PCP: Lolita Patella, MD, MD  [place] [facility]     Brief patient profile:  13 yowf  underwent a coronary bypass grafting in January, who presented 3/10  with right-sided pleuritic chest pain and dyspnea. She underwent CT scanning which showed a large saddle embolus. She was started on a heparin drip and admitted to the ICU for further evaluation. BNP and troponin were negative in ED. Echocardiogram showed right heart strain and a possible mass in the left atrium.  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    Microbiology/Sepsis markers: None  Abx:  none  Protocols:  None  Consults: [facility] cardiology Speech Rx 3/16 Recommend to continue current diet of dysphagia 2 with thin liquids with full supervision with all PO intake to provide cues as needed. --advanced to Dysphagia 3 on 3/19 Coumadin/pharmacy Audiology 3/18 for hearing loss: bilateral mixed hearing loss  Studies: CT angio 3/10 >> large B PE, saddle embolus LE dopplers 3/11 >> prelim positive for B LE DVT >> positive for B DVT IVC filter placed 3/11 Cardiac MRI 3/19>>>1) Situs inversus totalis 2) Normal LV EF 62% with no hyperenhancement 3) Dilated and hypokinetic mid and apical RV 4) Residual thrombus seen in right main pulmonary artery 5) Mural layered thrombus likely in the posterior aspect of the LA near the insertion point of the right lower pulmonary vein.   Subjective: Feeling better.  Still SOB with any significant activity.  Anxious to hear what cards will have to say about cardiac MRI.   Vital signs for last 24 hours: BP 146/87  Pulse 88  Temp(Src) 98.5 F (36.9 C) (Axillary)  Resp 19  Ht 5\' 6"  (1.676 m)  Wt 194 lb 3.6 oz (88.1 kg)  BMI 31.35 kg/m2  SpO2 94%     Intake/Output  Summary (Last 24 hours) at 08/22/11 1137 Last data filed at 08/22/11 0600  Gross per 24 hour  Intake    120 ml  Output    800 ml  Net   -680 ml       Physical Exam:  Gen: chronically ill appearing, sitting up in chair, NAD HEENT: NCAT, PERRL, EOMi, TM's clear bilaterally with normal light reflex, no buldging PULM: insp crackles left base CV: RRR, no mgr, no JVD AB: BS+, soft, nontender, no hsm Ext: warm, edema in R , no clubbing, no cyanosis Derm: no rash or skin breakdown Neuro: A&Ox4, CN II-XII intact, strength 5/5 in all 4 extremities    LABS  Lab 08/22/11 0450 08/21/11 0500 08/19/11 0752  NA 140 141 136  K 3.4* 3.3* 3.9  CL 101 105 103  CO2 29 27 22   BUN 7 7 7   CREATININE 0.88 0.84 0.72  GLUCOSE 98 110* 107*    Lab 08/22/11 0450 08/21/11 0500 08/20/11 0700  HGB 11.3* 10.9* 10.7*  HCT 36.2 34.2* 32.7*  WBC 6.4 7.3 10.5  PLT 355 349 217     Dg Chest 2vsame Day  08/21/2011  *RADIOLOGY REPORT*  Clinical Data: Shortness of breath, history heart failure, bronchiectasis, open heart surgery, transposition  CHEST - 2 VIEW SAME DAY  Comparison: 08/19/2011  Findings: Enlargement of cardiac silhouette post median sternotomy and CABG. Dextrocardia with right side aortic arch again seen. Bibasilar effusions atelectasis greater on the right. Underlying emphysematous changes. Minimal  pulmonary vascular congestion. No definite pulmonary edema or segmental consolidation. Bones appear diffusely demineralized.  IMPRESSION: Enlargement of cardiac silhouette with minimal pulmonary vascular congestion. Bibasilar effusions atelectasis greater on right. Again noted dextrocardia and right side aortic arch.  Original Report Authenticated By: Lollie Marrow, M.D.   Mr Card Morphology Wo/w Cm  08/21/2011  Cardiac MRI:  Indication: ? LA mass on echo  Comparison:  Echo done 08/12/11 and cardiac CT done 06/18/11  Protocol:  The patient was scanned on a 1.5 Tesla GE magnet.  A dedicated cardiac coil was  used.  Functional imaging was done using Fiesta sequences.  Quantitative EF was calculated using Circle software on a dedicated work station.  The patient received 30cc of Multihance for delayed enhancement images.  Findings:  There was situs inversus totalis.  There is atrioventricular concordance on both the right and the left side of the heart. There is ventriculo-arterial concordance on both size of the heart as well. The liver is on the left side of the abdomen  The LV was normal in size and function.  Quantitative EF was 62% ( EDV 81cc, ESV 30cc, SV 51cc) There was no hyperenhancement or scar tissue.  The atrial were normal in size.  The mid and apical RV were dilated and hypokinetic.  There was residual thrombus seen in the RPA.  The left atrium appeared to have an area of mural layered thrombus posteriorly near the insertion of the right lower pulmonary vein. This finding was actually more prominent on the cardiac CT done in January.  There was bilateral pleural effusions greater on the right with abnormal RLL lung tissue appearance possibly consistant with infracted lung parenchyma.  Impression:     1)    Situs inversus totalis        2)    Normal LV EF 62% with no hyperenhancement 3)    Dilated and hypokinetic mid and apical RV 4)    Residual thrombus seen in right main pulmonary artery 5)    Mural layered thrombus likely in the posterior aspect of the LA near the insertion point of the right lower pulmonary vein. Consistant and actually seen better on TTE and previously done cardiac CT Charlton Haws MD [facility]  Original Report Authenticated By: Marita Snellen    Assessment/Plan This is a 71 y/o female with situs inversus who developed a massive PE two months post CABG.  Had right heart strain and residual clot noticed so IVC filter placed.  Active Problems:  Acute respiratory failure with hypoxia  Saddle embolism of pulmonary artery  Pericardial effusion  Atrial mass   Pulmonary Embolism  (radiographically demonstrated, spiral CT and without hemodynamic compromise) Lab 08/22/11 0450 08/21/11 0500 08/20/11 0700 08/19/11 0752 08/18/11 0600  INR 2.14* 2.64* 2.65* 2.22* 1.55*  PLAN -  Cont coumadin per pharmacy, will need anticoag for life (also remote hx DVT)  IVC filter in place r/t large clot burden RLE   Acute hypoxic resp failure - r/t large saddle PE.  ?Bronchiectasis. Improving.  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 Once she is recovered from her acute illness it may be reasonable to refer her to [facility] for evaluation of this +/- bronchiectasis pulm hygiene O2 See above Pain control   CHF: volume up on exam -- appreciate cardiology management  Dysphagia: -- swallowing eval  3/16 Recommend to continue current diet of dysphagia 3 with thin liquids with full supervision with all PO intake to  provide cues as needed. -- may consider MBS later this week as respiratory status improves  DM - SSI protocol - metformin restarted 3/19  L atrial mass on TTE, - Per cardiac MRI likely clot. Cards following.  PLAN -  - already anticoagulated - appreciate cardiology input, ?any additional rx needed   Hearing Loss: uncertain etiology, but appears to be improving with treatment of sinuses; appreciate audiology input; no clear ototoxic meds per pharmacy Plan: -ENT consult if does not return to baseline -continue sinus regimen (saline)  Best practices / Disposition: --> tele floor status --> consult social work for placement, likely to SNF (prefer [facility])  -->DNR -->coumadin for DVT/PE -->dysphagia diet   Jeannifer Drakeford, NP 08/22/2011  11:37 AM Pager: [phone]  *Care during the described time interval was provided by me and/or other providers on the critical care team. I have reviewed this patient's available data, including medical history, events of note, physical examination and test results as part of my  evaluation.

## 2011-08-22 NOTE — Progress Notes (Signed)
Physical Therapy Treatment Patient Details Name: [name] MRN: [id number] DOB: [birth year] Today's Date: 08/22/2011  PT Assessment/Plan  PT - Assessment/Plan Comments on Treatment Session: Pt with increased gait distance today.  Continues to require cues for breathing techniques. PT Plan: Discharge plan remains appropriate;Frequency remains appropriate PT Frequency: Min 3X/week Follow Up Recommendations: Home health PT PT Goals  Acute Rehab PT Goals PT Goal: Supine/Side to Sit - Progress: Progressing toward goal PT Goal: Stand to Sit - Progress: Progressing toward goal PT Transfer Goal: Bed to Chair/Chair to Bed - Progress: Progressing toward goal PT Goal: Stand - Progress: Progressing toward goal PT Goal: Ambulate - Progress: Progressing toward goal  PT Treatment Precautions/Restrictions  Precautions Precautions: Fall Restrictions Weight Bearing Restrictions: No Mobility (including Balance) Bed Mobility Supine to Sit: 6: Modified independent (Device/Increase time) Transfers Sit to Stand: 4: Min assist;From bed;From chair/3-in-1 Sit to Stand Details (indicate cue type and reason): cues for hand placement from bed and 3 in 1.  Cues for balance and safety with hygiene after toileting Ambulation/Gait Ambulation/Gait Assistance: 5: Supervision Ambulation/Gait Assistance Details (indicate cue type and reason): close supervision, slow cadence, fwd flexed posture.  Pt on 3L O2 duing gait,  Sats from 87-94% during gait training. Cues for deep breathing techniques Ambulation Distance (Feet): 135 Feet Assistive device: Rolling walker  Static Standing Balance Static Standing - Level of Assistance: 5: Stand by assistance Static Standing - Comment/# of Minutes: for toileting, hygiene Exercise    End of Session PT - End of Session Equipment Utilized During Treatment: Gait belt Activity Tolerance: Patient tolerated treatment well Patient left: in chair;with call bell in reach;with  family/visitor present General Behavior During Session: [facility] for tasks performed Cognition: [facility] for tasks performed  [facility] 08/22/2011, 5:57 PM

## 2011-08-22 NOTE — Progress Notes (Signed)
Clinical Social Worker provided bed offers to spouse. Spouse expressed interest in [facility] [place].  CSW to continue to follow and assist as needed.  Angelia Mould, MSW, [place] [phone]

## 2011-08-22 NOTE — Progress Notes (Signed)
[facility]QU[facility], DOUGLAS [place] [facility] Pager: [phone] If no response, call [phone]  

## 2011-08-22 NOTE — Progress Notes (Signed)
ANTICOAGULATION CONSULT NOTE - Follow Up Consult  Pharmacy Consult for Coumadin Indication: pulmonary embolus and DVT  Allergies  Allergen Reactions  . Penicillins Anaphylaxis    Patient Measurements: Height: 5\' 6"  (167.6 cm) Weight: 194 lb 3.6 oz (88.1 kg) IBW/kg (Calculated) : 59.3    Vital Signs: Temp: 98.5 F (36.9 C) (03/20 0612) Temp src: Axillary (03/20 0612) BP: 146/87 mmHg (03/20 0612) Pulse Rate: 88  (03/20 0612)  Labs:  Basename 08/22/11 0450 08/21/11 0500 08/20/11 0700  HGB 11.3* 10.9* --  HCT 36.2 34.2* 32.7*  PLT 355 349 217  APTT -- -- --  LABPROT 24.3* 28.6* 28.7*  INR 2.14* 2.64* 2.65*  HEPARINUNFRC <0.10* 0.60 0.67  CREATININE 0.88 0.84 --  CKTOTAL -- -- --  CKMB -- -- --  TROPONINI -- -- --   Estimated Creatinine Clearance: 67.4 ml/min (by C-G formula based on Cr of 0.88).   Medications:  Scheduled:    . allopurinol  200 mg Oral Daily  . aspirin  325 mg Oral Daily  . gabapentin  300 mg Oral BID  . gadobenate dimeglumine  30 mL Intravenous Once  . guaiFENesin  5-10 mL Oral TID  . insulin aspart  0-9 Units Subcutaneous TID WC  . metolazone  5 mg Oral Daily  . metoprolol tartrate  25 mg Oral BID  . pantoprazole sodium  40 mg Per Tube Q1200  . potassium chloride  40 mEq Oral Daily  . simvastatin  20 mg Oral q1800  . sodium chloride  2 spray Each Nare QID  . warfarin  2.5 mg Oral q1800  . Warfarin - Pharmacist Dosing Inpatient   Does not apply q1800    Assessment:  Patient is a 71 y/o female on Coumadin for saddle PE and LLE DVT.  Cardiac MR reports residual thrombus seen in right main pulmonary artery and mural layered thrombus.  INR  theapeutic 2.14,  H/H/P stable.  Off Heparin.   Goal of Therapy:   INR 2-3   Plan:   Coumadin 5 mg po today.  (May need alternating doses with 2.5 mg).  Daily labs to determine maintenance schedule.  Veryl Winemiller, Elisha Headland, Pharm.D. 08/22/2011 8:55 AM

## 2011-08-22 NOTE — Progress Notes (Signed)
SUBJECTIVE: Feeling better. No chest pain. Breathing is slightly better.   BP 146/87  Pulse 88  Temp(Src) 98.5 F (36.9 C) (Axillary)  Resp 19  Ht 5\' 6"  (1.676 m)  Wt 194 lb 3.6 oz (88.1 kg)  BMI 31.35 kg/m2  SpO2 94%  Intake/Output Summary (Last 24 hours) at 08/22/11 1326 Last data filed at 08/22/11 0600  Gross per 24 hour  Intake    120 ml  Output    800 ml  Net   -680 ml    PHYSICAL EXAM General: Well developed, well nourished, in no acute distress. Alert and oriented x 3.  Psych:  Good affect, responds appropriately Neck: No JVD. No masses noted.  Lungs: Clear bilaterally with no wheezes or rhonci noted.  Heart: RRR with no murmurs noted. Abdomen: Bowel sounds are present. Soft, non-tender.  Extremities: 2+ edema right lower ext. Trace edema left lower ext.   LABS: Basic Metabolic Panel:  Basename 08/22/11 0450 08/21/11 0500  NA 140 141  K 3.4* 3.3*  CL 101 105  CO2 29 27  GLUCOSE 98 110*  BUN 7 7  CREATININE 0.88 0.84  CALCIUM 9.4 9.2  MG -- --  PHOS -- --   CBC:  Basename 08/22/11 0450 08/21/11 0500  WBC 6.4 7.3  NEUTROABS -- --  HGB 11.3* 10.9*  HCT 36.2 34.2*  MCV 91.2 89.5  PLT 355 349    Current Meds:    . allopurinol  200 mg Oral Daily  . aspirin  325 mg Oral Daily  . gabapentin  300 mg Oral BID  . gadobenate dimeglumine  30 mL Intravenous Once  . guaiFENesin  5-10 mL Oral TID  . insulin aspart  0-9 Units Subcutaneous TID WC  . metolazone  5 mg Oral Daily  . metoprolol tartrate  25 mg Oral BID  . pantoprazole sodium  40 mg Per Tube Q1200  . potassium chloride  40 mEq Oral Daily  . simvastatin  20 mg Oral q1800  . sodium chloride  2 spray Each Nare QID  . warfarin  2.5 mg Oral q1800  . warfarin  5 mg Oral ONCE-1800  . Warfarin - Pharmacist Dosing Inpatient   Does not apply q1800   Cardiac MRI: 08/21/11 Impression:  1) Situs inversus totalis  2) Normal LV EF 62% with no hyperenhancement  3) Dilated and hypokinetic mid and  apical RV  4) Residual thrombus seen in right main pulmonary artery  5) Mural layered thrombus likely in the posterior aspect of the  LA near the insertion point of the right lower pulmonary vein.  Consistant and actually seen better on TTE and previously done  cardiac CT   ASSESSMENT AND PLAN: 1. 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 This mass is on the posterior wall of the atrium. Cardiac MRI yesterday c/w laminated thrombus. Further review of cardiac CT from January is also c/w thrombus. She is on anti-coagulation for her PE. Will continue coumadin. Will need repeat echo in 6 months to re-evaluate thrombus in left atrium.   2. CAD: Stable. Continue current cardiac meds.   3. Volume overload: Likely combination of right sided failure from RV strain in setting of large PE and diastolic failure. Diuresing with metolazone 5mg  daily. Renal function stable. Could increase metolazone if needed.    4. Pulmonary emboli/DVT- stable; anticoagulated on  Coumadin; INR therapeutic also s/p infrarenal IVC filter placement on 03/11.  Patient still dyspneic, with LE edema (R>L). Will continue diurese as above.  5. HTN: BP still elevated. Will increase Lopressor to 50 mg po BID which is her home dose.    [name]  3/20/20131:26 PM

## 2011-08-23 ENCOUNTER — Inpatient Hospital Stay (HOSPITAL_COMMUNITY): Payer: Medicare Other

## 2011-08-23 DIAGNOSIS — J479 Bronchiectasis, uncomplicated: Secondary | ICD-10-CM

## 2011-08-23 DIAGNOSIS — I2699 Other pulmonary embolism without acute cor pulmonale: Secondary | ICD-10-CM

## 2011-08-23 DIAGNOSIS — J9600 Acute respiratory failure, unspecified whether with hypoxia or hypercapnia: Secondary | ICD-10-CM

## 2011-08-23 DIAGNOSIS — H902 Conductive hearing loss, unspecified: Secondary | ICD-10-CM

## 2011-08-23 LAB — PROTIME-INR
INR: 1.95 — ABNORMAL HIGH (ref 0.00–1.49)
Prothrombin Time: 22.6 seconds — ABNORMAL HIGH (ref 11.6–15.2)

## 2011-08-23 LAB — GLUCOSE, CAPILLARY
Glucose-Capillary: 112 mg/dL — ABNORMAL HIGH (ref 70–99)
Glucose-Capillary: 118 mg/dL — ABNORMAL HIGH (ref 70–99)

## 2011-08-23 MED ORDER — WARFARIN SODIUM 5 MG PO TABS
5.0000 mg | ORAL_TABLET | Freq: Once | ORAL | Status: AC
  Administered 2011-08-23: 5 mg via ORAL
  Filled 2011-08-23: qty 1

## 2011-08-23 NOTE — Progress Notes (Signed)
   CARE MANAGEMENT NOTE 08/23/2011  Patient:  [name], [name]   Account Number:  [id number]  Date Initiated:  08/21/2011  Documentation initiated by:  [facility]  Subjective/Objective Assessment:   Saddle PE's, bilat DVT's.  Has husband     Action/Plan:   PTA, PT LIVES WITH HUSBAND; PMH OF CABG IN [place] OF 2013.   Anticipated DC Date:  08/24/2011   Anticipated DC Plan:  SKILLED NURSING FACILITY  In-house referral  Clinical Social Worker      DC Planning Services  CM consult      Choice offered to / List presented to:             Status of service:  Completed, signed off Medicare Important Message given?   (If response is "NO", the following Medicare IM given date fields will be blank) Date Medicare IM given:   Date Additional Medicare IM given:    Discharge Disposition:  SKILLED NURSING FACILITY  Per UR Regulation:  Reviewed for med. necessity/level of care/duration of stay  If discussed at Long Length of Stay Meetings, dates discussed:   08/22/2011    Comments:  08/23/11 Leith Hedlund,RN,BSN 1200 PT DISCHARGING TODAY TO [facility], PER CSW ARRANGEMENTS. Phone #[phone]   08/21/11 Boby Eyer,RN,BSN 1100 MET WITH PT AND HUSBAND TO DISCUSS DC PLANS.  PT STATES SHE IS PROBABLY GOING TO NEED SOME REHAB PRIOR TO RETURNING HOME, AS SHE FEELS THAT SHE WILL HAVE TO COME RIGHT BACK TO THE HOSPITAL IF SHE DOESN'T.  SHE PREFERS SHORT TERM SNF AT [facility] IN [place], IF POSSIBLE, AS IT IS NEAR HER HOME.  WILL CONSULT CSW TO FACILITATE DC TO SNF WHEN MEDICALLY STABLE.  WILL FOLLOW. Phone #[phone]

## 2011-08-23 NOTE — Progress Notes (Signed)
Speech Language/Pathology   Received orders for MBS. Previous recommendations were for patient to have a barium swallow to assess esophageal function given that oropharyngeal swallow has appeared [facility] at bedside and that patient complains (globus, regurgitation) have been primarily esophageal in nature. Discussed with Dr. Kendrick Fries. Will defer MBS at this time. If indication of aspiration or an oropharyngeal dysphagia is noted on barium swallow, can proceed with MBS pending new orders. Thank you Ferdinand Lango MA, CCC-SLP [phone]

## 2011-08-23 NOTE — Progress Notes (Signed)
Patient name: [name] Medical record number: [id number] Date of birth: 1941-02-23 Age: 71 y.o. Gender: female PCP: Lolita Patella, MD, MD  [facility]     Brief patient profile:  50 yowf  underwent a coronary bypass grafting in January, who presented 3/10  with right-sided pleuritic chest pain and dyspnea. She underwent CT scanning which showed a large saddle embolus. She was started on a heparin drip and admitted to the ICU for further evaluation. BNP and troponin were negative in ED. Echocardiogram showed right heart strain and a possible mass in the left atrium.  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    Microbiology/Sepsis markers: None  Abx:  none  Protocols:  None  Consults: [facility] cardiology for LA mass Speech Rx 3/16 Recommend to continue current diet of dysphagia 2 with thin liquids with full supervision with all PO intake to provide cues as needed. --advanced to Dysphagia 3 on 3/19 Coumadin/pharmacy Audiology 3/18 for hearing loss: bilateral mixed hearing loss   Studies: CT angio 3/10 >> large B PE, saddle embolus LE dopplers 3/11 >> prelim positive for B LE DVT >> positive for B DVT IVC filter placed 3/11 Cardiac MRI 3/19>>>1) Situs inversus totalis 2) Normal LV EF 62% with no hyperenhancement 3) Dilated and hypokinetic mid and apical RV 4) Residual thrombus seen in right main pulmonary artery 5) Mural layered thrombus likely in the posterior aspect of the LA near the insertion point of the right lower pulmonary vein. 3/19 Cardiac MRI >> LA thrombus  Subjective: Has been walking around, feeling better; has rash on R leg   Vital signs for last 24 hours: BP 110/65  Pulse 95  Temp(Src) 97.3 F (36.3 C) (Oral)  Resp 18  Ht 5\' 6"  (1.676 m)  Wt 88.1 kg (194 lb 3.6 oz)  BMI 31.35 kg/m2  SpO2 98%    No intake or output data in the 24 hours  ending 08/23/11 1009      Physical Exam:  Gen: chronically ill appearing, sitting up in chair, NAD HEENT: NCAT, PERRL, EOMi, TM's clear bilaterally with normal light reflex, no buldging PULM: insp crackles left base CV: RRR, no mgr, no JVD AB: BS+, soft, nontender, no hsm Ext: R leg with significant edema, slight erythematous rash that is not warm or tender, good sensation, warmth; dp/pt pulses 2+ Derm: no rash or skin breakdown Neuro: A&Ox4, CN II-XII intact, strength 5/5 in all 4 extremities    LABS  Lab 08/22/11 0450 08/21/11 0500 08/19/11 0752  NA 140 141 136  K 3.4* 3.3* 3.9  CL 101 105 103  CO2 29 27 22   BUN 7 7 7   CREATININE 0.88 0.84 0.72  GLUCOSE 98 110* 107*    Lab 08/22/11 0450 08/21/11 0500 08/20/11 0700  HGB 11.3* 10.9* 10.7*  HCT 36.2 34.2* 32.7*  WBC 6.4 7.3 10.5  PLT 355 349 217     Dg Chest 2vsame Day  08/21/2011  *RADIOLOGY REPORT*  Clinical Data: Shortness of breath, history heart failure, bronchiectasis, open heart surgery, transposition  CHEST - 2 VIEW SAME DAY  Comparison: 08/19/2011  Findings: Enlargement of cardiac silhouette post median sternotomy and CABG. Dextrocardia with right side aortic arch again seen. Bibasilar effusions atelectasis greater on the right. Underlying emphysematous changes. Minimal pulmonary vascular congestion. No definite pulmonary edema or segmental consolidation. Bones appear diffusely demineralized.  IMPRESSION: Enlargement of cardiac silhouette  with minimal pulmonary vascular congestion. Bibasilar effusions atelectasis greater on right. Again noted dextrocardia and right side aortic arch.  Original Report Authenticated By: Lollie Marrow, M.D.   Mr Card Morphology Wo/w Cm  08/21/2011  Cardiac MRI:  Indication: ? LA mass on echo  Comparison:  Echo done 08/12/11 and cardiac CT done 06/18/11  Protocol:  The patient was scanned on a 1.5 Tesla GE magnet.  A dedicated cardiac coil was used.  Functional imaging was done using Fiesta  sequences.  Quantitative EF was calculated using Circle software on a dedicated work station.  The patient received 30cc of Multihance for delayed enhancement images.  Findings:  There was situs inversus totalis.  There is atrioventricular concordance on both the right and the left side of the heart. There is ventriculo-arterial concordance on both size of the heart as well. The liver is on the left side of the abdomen  The LV was normal in size and function.  Quantitative EF was 62% ( EDV 81cc, ESV 30cc, SV 51cc) There was no hyperenhancement or scar tissue.  The atrial were normal in size.  The mid and apical RV were dilated and hypokinetic.  There was residual thrombus seen in the RPA.  The left atrium appeared to have an area of mural layered thrombus posteriorly near the insertion of the right lower pulmonary vein. This finding was actually more prominent on the cardiac CT done in January.  There was bilateral pleural effusions greater on the right with abnormal RLL lung tissue appearance possibly consistant with infracted lung parenchyma.  Impression:     1)    Situs inversus totalis        2)    Normal LV EF 62% with no hyperenhancement 3)    Dilated and hypokinetic mid and apical RV 4)    Residual thrombus seen in right main pulmonary artery 5)    Mural layered thrombus likely in the posterior aspect of the LA near the insertion point of the right lower pulmonary vein. Consistant and actually seen better on TTE and previously done cardiac CT Charlton Haws MD [facility]  Original Report Authenticated By: Marita Snellen    Assessment/Plan This is a 71 y/o female with situs inversus who developed a massive PE two months post CABG.  Had right heart strain and residual clot noticed so IVC filter placed.  Active Problems:  Acute respiratory failure with hypoxia  Saddle embolism of pulmonary artery  Pericardial effusion  Atrial mass   Pulmonary Embolism (radiographically demonstrated, spiral CT and without  hemodynamic compromise)  Lab 08/23/11 0623 08/22/11 0450 08/21/11 0500 08/20/11 0700 08/19/11 0752  INR 1.95* 2.14* 2.64* 2.65* 2.22*  PLAN -  Cont coumadin per pharmacy, will need anticoag for life (also remote hx DVT)  IVC filter in place r/t large clot burden RLE will plan to remove in 6 months if clot gone TED hose for post phlebotic syndrome   Acute hypoxic resp failure - r/t large saddle PE.  ?Bronchiectasis. Improving.  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 Once she is recovered from her acute illness it may be reasonable to refer her to [facility] for evaluation of this +/- bronchiectasis pulm hygiene O2 See above Pain control   CHF: volume up on exam -- appreciate cardiology management -- continue metolazone  Dysphagia: -- swallowing eval  3/16 Recommend to continue current diet of dysphagia 3 with thin liquids with full supervision with all PO intake to  provide cues as needed. -- MBS today  DM - SSI protocol - metformin restarted 3/19  L atrial mass on TTE, - Per cardiac MRI shows clot. Cards following.  PLAN -  - already anticoagulated - appreciate cardiology input - F/U TTE in 6 months per cards  Hearing Loss: due to sinus congestion;  Plan: -ENT consult if does not return to baseline -continue sinus regimen (saline)  Best practices / Disposition: --> tele floor status --> consult social work for placement, likely to SNF tomorrow -->DNR -->coumadin for DVT/PE -->dysphagia diet    *Care during the described time interval was provided by me and/or other providers on the critical care team. I have reviewed this patient's available data, including medical history, events of note, physical examination and test results as part of my evaluation.   Yolonda Kida PCCM Pager: [phone] If no response, call [phone]

## 2011-08-23 NOTE — Progress Notes (Signed)
ANTICOAGULATION CONSULT NOTE - Follow Up Consult  Pharmacy Consult for Coumadin Indication: pulmonary embolus and DVT  Allergies  Allergen Reactions  . Penicillins Anaphylaxis   Vital Signs: Temp: 97.3 F (36.3 C) (03/21 0630) Temp src: Oral (03/21 0630) BP: 96/62 mmHg (03/21 0630) Pulse Rate: 95  (03/21 0630)  Labs:  Basename 08/23/11 0623 08/22/11 0450 08/21/11 0500  HGB -- 11.3* 10.9*  HCT -- 36.2 34.2*  PLT -- 355 349  APTT -- -- --  LABPROT 22.6* 24.3* 28.6*  INR 1.95* 2.14* 2.64*  HEPARINUNFRC -- <0.10* 0.60  CREATININE -- 0.88 0.84  CKTOTAL -- -- --  CKMB -- -- --  TROPONINI -- -- --   Estimated Creatinine Clearance: 67.4 ml/min (by C-G formula based on Cr of 0.88).  Assessment: 70yof continues on coumadin for saddle PE and LLE DVT.  Findings of cardiac MRI noted. INR continues to decrease and is slightly below goal today but rate of decrease is less with the 5mg  dose given last night. No bleeding noted per chart notes.  Goal of Therapy:  INR 2-3   Plan:  1) Repeat coumadin 5mg  x 1 2) Follow up INR in AM  Fredrik Rigger 08/23/2011,7:46 AM

## 2011-08-23 NOTE — Progress Notes (Signed)
    SUBJECTIVE: Feeling a little better. Breathing better. Hearing better. No chest pain. Right leg is sore.   BP 110/65  Pulse 95  Temp(Src) 97.3 F (36.3 C) (Oral)  Resp 18  Ht 5\' 6"  (1.676 m)  Wt 194 lb 3.6 oz (88.1 kg)  BMI 31.35 kg/m2  SpO2 98% No intake or output data in the 24 hours ending 08/23/11 1115  PHYSICAL EXAM General: Well developed, well nourished, in no acute distress. Alert and oriented x 3.  Psych:  Good affect, responds appropriately Neck: No JVD. No masses noted.  Lungs: Clear bilaterally with no wheezes or rhonci noted.  Heart: RRR with no murmurs noted. Abdomen: Bowel sounds are present. Soft, non-tender.  Extremities: 2+ right lower extremity edema. No left lower extremity edema.   LABS: Basic Metabolic Panel:  Basename 08/22/11 0450 08/21/11 0500  NA 140 141  K 3.4* 3.3*  CL 101 105  CO2 29 27  GLUCOSE 98 110*  BUN 7 7  CREATININE 0.88 0.84  CALCIUM 9.4 9.2  MG -- --  PHOS -- --   CBC:  Basename 08/22/11 0450 08/21/11 0500  WBC 6.4 7.3  NEUTROABS -- --  HGB 11.3* 10.9*  HCT 36.2 34.2*  MCV 91.2 89.5  PLT 355 349    Current Meds:    . allopurinol  200 mg Oral Daily  . aspirin  325 mg Oral Daily  . gabapentin  300 mg Oral BID  . guaiFENesin  5-10 mL Oral TID  . insulin aspart  0-9 Units Subcutaneous TID WC  . metolazone  5 mg Oral Daily  . metoprolol tartrate  50 mg Oral BID  . pantoprazole sodium  40 mg Per Tube Q1200  . potassium chloride  40 mEq Oral Daily  . simvastatin  20 mg Oral q1800  . sodium chloride  2 spray Each Nare QID  . warfarin  5 mg Oral ONCE-1800  . warfarin  5 mg Oral ONCE-1800  . Warfarin - Pharmacist Dosing Inpatient   Does not apply q1800  . DISCONTD: metoprolol tartrate  25 mg Oral BID     ASSESSMENT AND PLAN:   1. Left atrial mass- Appears to be laminated thrombus in the left atrium. She is on anti-coagulation for her DVT/PE already. Will continue coumadin for lifetime. Will need repeat echo in 6  months to re-evaluate thrombus in left atrium.   2. CAD: Stable. Continue current cardiac meds.   3. Volume overload: I suspect that her intravascular volume is better. She still has right LE edema which is likely from her large clot burden. She does have diastolic dysfunction and likely has RV strain in setting of large PE. Would continue metolazone. Renal function stable.  4. Pulmonary emboli/DVT- s/p IVC filter and now therapeutic on coumadin.    5. HTN: BP much improved. Continue Lopressor 50 mg po BID which is her home dose.    Haruto Demaria  3/21/201311:15 AM

## 2011-08-23 NOTE — Discharge Summary (Signed)
Physician Discharge Summary  Patient ID: Alyssa BRILLIANT MRN: [id number] DOB/AGE: Oct 08, 1940 71 y.o.  Admit date: 08/12/2011 Discharge date: 08/24/2011    Discharge Diagnoses:  Acute hypoxic respiratory failure secondary to saddle embolism of pulmonary artery Pericardial effusion Atrial mass DVT S/p IVC filter placement Left Atrial Thrombus Severe Deconditioning Bronchiectasis Chronic Aspiration   Brief Summary: [name] is a 71 y.o. y/o female with a PMH of situs inversus totalis, GERD, HTN, HLD, CAD, ICM (EF 1/13 40-45%),  Bronchiectasis, chronic systolic CHF who underwent t a coronary bypass grafting in January, presented 3/10 with right-sided pleuritic chest pain and dyspnea. She underwent CT scanning which showed a large saddle embolus. She was started on a heparin drip and admitted to the ICU for further evaluation. BNP and troponin were negative in ED. Echocardiogram showed right heart strain and a possible mass in the left atrium.  She did not meet criteria for thrombolysis.  Bilateral lower extremity dopplers were positive for subacute DVT noted throughout the right lower extremity, including the posterior tibial, popliteal, femoral and common femoral veins. There is acute, occlusive DVT noted in the posterior tibial vein of the left lower extremity.  Patient carries history of DVT.  Patient will need coumadin for life.  She continued to have high O2 requirements and decision was made to place IVC filter.  Hospital course was complicated by volume overload in setting of RV strain secondary to PE / diastolic failure.  She was aggressively diuresed to achieve negative balance.  Cardiology was consulted to further evaluate questionable mass on CT scan.  She further underwent cardiac MRI on 3/19 and questionable mass was deemed to be consistent with laminated thrombus.  Repeat ECHO in 6 months recommend to re-evaluate left atrial thrombus.  She was transitioned from heparin to  coumadin.  Patient continued to make very slow progress in setting of acute on chronic critical illness.  Patient complained of sensation of food becoming stuck.  Swallow evaluation completed by SLP and recommend patient to have dysphagia 3 diet with thin liquids, ground meats. However, Speech Therapy feels no evidence of aspiration on bedside swallow evaluation--suspected primary esophageal dysfunction.  Recommend Regular Barium Swallow evaluation or further GI follow up if continues to have symptoms.  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  Patient complained of difficulty hearing and was evaluated by Audiologist.  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  She is recommended to have full ENT consultation for complete audiologic evaluation when discharged.     Patient will need follow up PT / INR on Monday 3/25  Dysphagia 3 Diet, thin liquids, ground meats.     Consults:  [facility] cardiology  Speech Rx 3/16 Recommend to continue current diet of dysphagia 2 with thin liquids with full supervision with all PO intake to provide cues as needed. --advanced to Dysphagia 3 on 3/19  Coumadin/pharmacy  Audiology 3/18 for hearing loss: bilateral mixed hearing loss   Studies:  CT angio 3/10 >> large B PE, saddle embolus  LE dopplers 3/11 >> prelim positive for B LE DVT >> positive for B DVT  IVC filter placed 3/11 3/19 Cardiac MRI>>>Situs inversus totalis.  Normal LV EF 62% with no hyperenhancement. Dilated and hypokinetic mid and apical RV.  Residual thrombus seen in right main pulmonary artery.  Mural layered thrombus likely in the posterior aspect of the LA near the insertion point of the right lower pulmonary vein. Consistant and actually seen better on TTE and previously done cardiac CT   Discharge Exam: Gen: chronically ill  appearing, sitting up in chair, NAD  HEENT: NCAT, PERRL, EOMi, TM's clear bilaterally with normal light reflex, no buldging  PULM: insp crackles left base  CV: RRR, no mgr, no JVD  AB: BS+, soft, nontender, no hsm  Ext: R leg with significant edema, slight erythematous rash that is not warm or tender, good sensation, warmth; dp/pt pulses 2+  Derm: no rash or skin breakdown  Neuro: A&Ox4, CN II-XII intact, strength 5/5 in all 4 extremities   Discharge Labs  BMET  Lab 08/24/11 0500 08/22/11 0450 08/21/11 0500 08/19/11 0752 08/18/11 0600  NA 141 140 141 136 138  K 3.3* 3.4* -- -- --  CL 101 101 105 103 103  CO2 31 29 27 22 27   GLUCOSE 122* 98 110* 107* 113*  BUN 8 7 7 7 10   CREATININE 0.89 0.88 0.84 0.72 0.83  CALCIUM 9.3 9.4 9.2 9.1 9.0  MG -- -- -- -- --  PHOS -- -- -- -- --     CBC  Lab 08/24/11 0500 08/22/11 0450 08/21/11 0500  HGB 11.9* 11.3* 10.9*  HCT 38.5 36.2 34.2*  WBC 7.6 6.4 7.3  PLT 358 355 349   Anti-Coagulation  Lab 08/24/11 0500 08/23/11 0623 08/22/11 0450 08/21/11 0500 08/20/11 0700  INR 2.09* 1.95* 2.14* 2.64* 2.65*      Discharge Orders    Future Appointments: Provider: Department: Dept Phone: Center:   09/11/2011 3:45 PM Kathleene Hazel, MD [facility] [place] [phone] [facility]   10/08/2011 3:00 PM Lupita Leash, MD [facility] [phone] None     Future Orders Please Complete By Expires   Diet - low sodium heart healthy      Increase activity slowly      Call MD for:  temperature >100.4      Call MD for:  persistant nausea and vomiting      Call MD for:  difficulty breathing, headache or visual disturbances      Call MD for:  persistant dizziness or light-headedness      Call MD for:  extreme fatigue          Follow-up Information    Follow up with Max Fickle, MD on 10/08/2011.   Contact information:   [address] [place] [address] [phone]         DISCHARGE  MEDICATIONS   [name], [name]  Home Medication Instructions BMW:[id number]   Printed on:08/24/11 1126  Medication Information                    aspirin 325 MG tablet Take 325 mg by mouth daily.             gabapentin (NEURONTIN) 300 MG capsule Take 300 mg by mouth 2 (two) times daily.             allopurinol (ZYLOPRIM) 100 MG tablet Take 200 mg by mouth daily.             omeprazole (PRILOSEC) 20 MG capsule Take 20 mg by mouth every morning.             traMADol (ULTRAM) 50 MG tablet Take 50 mg by mouth every 6 (six) hours as needed. For pain  metoprolol (LOPRESSOR) 50 MG tablet Take 50 mg by mouth 2 (two) times daily.           furosemide (LASIX) 20 MG tablet Take 20 mg by mouth daily. .           metFORMIN (GLUCOPHAGE) 850 MG tablet Take 850 mg by mouth 2 (two) times daily with a meal.           simvastatin (ZOCOR) 40 MG tablet Take 20 mg by mouth at bedtime.           warfarin (COUMADIN) 5 MG tablet Take 1 tablet (5 mg total) by mouth daily at 6 PM.           potassium chloride SA (K-DUR,KLOR-CON) 20 MEQ tablet Take 2 tablets (40 mEq total) by mouth daily.              Disposition: Discharge to Skilled Nursing Facility --[facility]  Discharged Condition: [name] has met maximum benefit of inpatient care and is medically stable and cleared for discharge.  Patient is pending follow up as above.      Time spent on disposition:  Greater than 35 minutes.   Signed: Canary Brim, NP-C [facility] Pgr: [phone]

## 2011-08-23 NOTE — Progress Notes (Signed)
Clinical Social Worker continuing to follow for discharge planning. Pt has chosen bed at [facility]. Facility notified. Per MD, pt not yet ready for discharge and anticipate discharge 3/22. Clinical Social Worker notified facility. Clinical Social Worker to facilitate pt discharge needs when pt medically ready for discharge.   Jacklynn Lewis, MSW, LCSWA (covering) Clinical Social Work

## 2011-08-24 DIAGNOSIS — N179 Acute kidney failure, unspecified: Secondary | ICD-10-CM

## 2011-08-24 LAB — CBC
MCH: 28.4 pg (ref 26.0–34.0)
MCHC: 30.9 g/dL (ref 30.0–36.0)
Platelets: 358 10*3/uL (ref 150–400)

## 2011-08-24 LAB — BASIC METABOLIC PANEL
Calcium: 9.3 mg/dL (ref 8.4–10.5)
GFR calc non Af Amer: 65 mL/min — ABNORMAL LOW (ref >90)
Sodium: 141 mEq/L (ref 135–145)

## 2011-08-24 LAB — PROTIME-INR: Prothrombin Time: 23.8 seconds — ABNORMAL HIGH (ref 11.6–15.2)

## 2011-08-24 MED ORDER — WARFARIN SODIUM 5 MG PO TABS: 5.0000 mg | ORAL_TABLET | Freq: Every day | ORAL | Status: DC

## 2011-08-24 MED ORDER — WARFARIN SODIUM 5 MG PO TABS
5.0000 mg | ORAL_TABLET | Freq: Every day | ORAL | Status: DC
  Filled 2011-08-24: qty 1

## 2011-08-24 MED ORDER — POTASSIUM CHLORIDE CRYS ER 20 MEQ PO TBCR: 40.0000 meq | EXTENDED_RELEASE_TABLET | Freq: Every day | ORAL | Status: DC

## 2011-08-24 MED ORDER — POTASSIUM CHLORIDE CRYS ER 20 MEQ PO TBCR
40.0000 meq | EXTENDED_RELEASE_TABLET | Freq: Once | ORAL | Status: AC
  Administered 2011-08-24: 40 meq via ORAL

## 2011-08-24 NOTE — Progress Notes (Signed)
Discontinued Pt's IV. No bleeding noted. Catheter intact. Pt tolerated well. D/C papers given and discharge instructions completed. Pt verbalized understanding. Pt ready for EMS.

## 2011-08-24 NOTE — Progress Notes (Signed)
Physical Therapy Treatment Patient Details Name: [name] MRN: [id number] DOB: 1941/03/16 Today's Date: 08/24/2011  PT Assessment/Plan  PT - Assessment/Plan Comments on Treatment Session: The patient has decreased gait distance today secondary to increased right leg pain.  Patient reports the pain increased due to compression hose that was donned last night and is still on bil legs.  She is due to d/c to SNF for reahb today.   PT Plan: Frequency remains appropriate;Discharge plan needs to be updated PT Frequency: Min 3X/week Follow Up Recommendations: Skilled nursing facility Equipment Recommended: Defer to next venue PT Goals  Acute Rehab PT Goals PT Goal: Supine/Side to Sit - Progress: Progressing toward goal PT Goal: Sit to Stand - Progress: Progressing toward goal PT Goal: Stand to Sit - Progress: Met PT Goal: Ambulate - Progress: Progressing toward goal PT Goal: Perform Home Exercise Program - Progress: Progressing toward goal  PT Treatment Precautions/Restrictions  Precautions Precautions: Fall Restrictions Weight Bearing Restrictions: No Mobility (including Balance) Bed Mobility Bed Mobility: Yes Supine to Sit: 6: Modified independent (Device/Increase time);With rails;HOB elevated (Comment degrees) (HOB 20 degrees) Sitting - Scoot to Edge of Bed: 6: Modified independent (Device/Increase time);With rail Transfers Transfers: Yes Sit to Stand: 5: Supervision;With upper extremity assist;From bed;From chair/3-in-1;With armrests Sit to Stand Details (indicate cue type and reason): supervision for safety Stand to Sit: 6: Modified independent (Device/Increase time);Without upper extremity assist;To chair/3-in-1;With armrests Stand to Sit Details: patient relying heavily on arms to help lower to sitting surface.   Ambulation/Gait Ambulation/Gait: Yes Ambulation/Gait Assistance: 4: Min assist Ambulation/Gait Assistance Details (indicate cue type and reason): min guard  assist for safety and balance.  3 L O2 [place] used during gait,  Ambulation Distance (Feet): 65 Feet Assistive device: Rolling walker Gait Pattern: Step-through pattern;Antalgic;Shuffle;Trunk flexed Gait velocity: < 1.8 ft/sec indicating risk for recurrent falls.      Exercise  General Exercises - Lower Extremity Long Arc Quad: AROM;10 reps;Both;Seated Hip Flexion/Marching: AROM;Both;10 reps;Seated Toe Raises: AROM;Both;10 reps;Seated Heel Raises: AROM;10 reps;Both;Seated End of Session PT - End of Session Equipment Utilized During Treatment:  (3 L O2 [place]) Activity Tolerance: Patient limited by fatigue;Patient limited by pain Patient left: in chair;with call bell in reach;with family/visitor present (husband) General Behavior During Session: [facility] for tasks performed Cognition: [facility] for tasks performed  Martinique Pizzimenti B. Alleigh Mollica, PT, DPT [phone] 08/24/2011, 12:00 PM

## 2011-08-24 NOTE — Progress Notes (Signed)
    SUBJECTIVE: No complaints this am. Breathing is better. No pain.   BP 113/69  Pulse 103  Temp(Src) 97.4 F (36.3 C) (Oral)  Resp 20  Ht 5\' 6"  (1.676 m)  Wt 186 lb 11.7 oz (84.7 kg)  BMI 30.14 kg/m2  SpO2 96%  Intake/Output Summary (Last 24 hours) at 08/24/11 0726 Last data filed at 08/24/11 0340  Gross per 24 hour  Intake    720 ml  Output   1700 ml  Net   -980 ml    PHYSICAL EXAM General: Well developed, well nourished, in no acute distress. Alert and oriented x 3.  Psych:  Good affect, responds appropriately Neck: No JVD. No masses noted.  Lungs: Clear bilaterally with no wheezes or rhonci noted.  Heart: RRR with no murmurs noted. Abdomen: Bowel sounds are present. Soft, non-tender.  Extremities: 2+ right lower ext edema. No left lower extremity edema.   LABS: Basic Metabolic Panel:  Basename 08/24/11 0500 08/22/11 0450  NA 141 140  K 3.3* 3.4*  CL 101 101  CO2 31 29  GLUCOSE 122* 98  BUN 8 7  CREATININE 0.89 0.88  CALCIUM 9.3 9.4  MG -- --  PHOS -- --   CBC:  Basename 08/24/11 0500 08/22/11 0450  WBC 7.6 6.4  NEUTROABS -- --  HGB 11.9* 11.3*  HCT 38.5 36.2  MCV 91.9 91.2  PLT 358 355    Current Meds:    . allopurinol  200 mg Oral Daily  . aspirin  325 mg Oral Daily  . gabapentin  300 mg Oral BID  . guaiFENesin  5-10 mL Oral TID  . insulin aspart  0-9 Units Subcutaneous TID WC  . metolazone  5 mg Oral Daily  . metoprolol tartrate  50 mg Oral BID  . pantoprazole sodium  40 mg Per Tube Q1200  . potassium chloride  40 mEq Oral Daily  . simvastatin  20 mg Oral q1800  . sodium chloride  2 spray Each Nare QID  . warfarin  5 mg Oral ONCE-1800  . Warfarin - Pharmacist Dosing Inpatient   Does not apply q1800     ASSESSMENT AND PLAN:  1. Left atrial mass- Appears to be laminated thrombus in the left atrium. She is on anti-coagulation for her DVT/PE already. Will continue coumadin for lifetime. Will need repeat echo in 6 months to re-evaluate  thrombus in left atrium.   2. CAD: Stable. Continue current cardiac meds.   3. Volume overload:  Volume status appears to be close to baseline. She still has right LE edema which is likely from her large clot burden. She does have diastolic dysfunction and likely has RV strain in setting of large PE. Would continue metolazone at current dose. Renal function stable.   4. Pulmonary emboli/DVT- s/p IVC filter and now therapeutic on coumadin.   5. HTN: BP much improved. Continue Lopressor 50 mg po BID which is her home dose.   6. Hypokalemia: She is scheduled to receive 40 meq KCl this am. Will give additional 40 meq KCl this am.   OK for d/c to SNF from my perspective.       Eldred Lievanos  3/22/20137:26 AM

## 2011-08-24 NOTE — Progress Notes (Signed)
Clinical Social Worker submitted appropriate paperwork to SNF.  CSW to arrange transport and sign off pt dc'd.  Angelia Mould, MSW, [place] [phone]

## 2011-08-24 NOTE — Progress Notes (Signed)
Patient name: [name] Medical record number: [id number] Date of birth: [birth year] Age: 71 y.o. Gender: female PCP: Lolita Patella, MD, MD  [facility]     Brief patient profile:  59 yowf  underwent a coronary bypass grafting in January, who presented 3/10  with right-sided pleuritic chest pain and dyspnea. She underwent CT scanning which showed a large saddle embolus. She was started on a heparin drip and admitted to the ICU for further evaluation. BNP and troponin were negative in ED. Echocardiogram showed right heart strain and a possible mass in the left atrium.  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    Microbiology/Sepsis markers: None  Abx:  none  Protocols:  None  Consults: [facility] cardiology for LA mass Speech Rx 3/16 Recommend to continue current diet of dysphagia 2 with thin liquids with full supervision with all PO intake to provide cues as needed. --advanced to Dysphagia 3 on 3/19 Coumadin/pharmacy Audiology 3/18 for hearing loss: bilateral mixed hearing loss   Studies: CT angio 3/10 >> large B PE, saddle embolus LE dopplers 3/11 >> prelim positive for B LE DVT >> positive for B DVT IVC filter placed 3/11 Cardiac MRI 3/19>>>1) Situs inversus totalis 2) Normal LV EF 62% with no hyperenhancement 3) Dilated and hypokinetic mid and apical RV 4) Residual thrombus seen in right main pulmonary artery 5) Mural layered thrombus likely in the posterior aspect of the LA near the insertion point of the right lower pulmonary vein. 3/19 Cardiac MRI >> LA thrombus  Subjective: C/o RLE pain & swelling, has TED hose on  Vital signs for last 24 hours: BP 113/69  Pulse 103  Temp(Src) 97.4 F (36.3 C) (Oral)  Resp 20  Ht 5\' 6"  (1.676 m)  Wt 84.7 kg (186 lb 11.7 oz)  BMI 30.14 kg/m2  SpO2 96%     Intake/Output Summary (Last 24 hours) at 08/24/11  0837 Last data filed at 08/24/11 0340  Gross per 24 hour  Intake    720 ml  Output   1700 ml  Net   -980 ml        Physical Exam:  Gen: chronically ill appearing, sitting up , NAD HEENT: NCAT, PERRL, EOMi PULM: insp crackles left base CV: RRR, no mgr, no JVD AB: BS+, soft, nontender, no hsm Ext: R leg with significant edema, slight erythematous rash that is not warm or tender, good sensation, warmth; dp/pt pulses 2+ Derm: no rash or skin breakdown Neuro: A&Ox4, CN II-XII intact, strength 5/5 in all 4 extremities    LABS  Lab 08/24/11 0500 08/22/11 0450 08/21/11 0500  NA 141 140 141  K 3.3* 3.4* 3.3*  CL 101 101 105  CO2 31 29 27   BUN 8 7 7   CREATININE 0.89 0.88 0.84  GLUCOSE 122* 98 110*    Lab 08/24/11 0500 08/22/11 0450 08/21/11 0500  HGB 11.9* 11.3* 10.9*  HCT 38.5 36.2 34.2*  WBC 7.6 6.4 7.3  PLT 358 355 349     Dg Esophagus  08/23/2011  *RADIOLOGY REPORT*  Clinical Data: Dysphasia.  Sensation of food getting stuck in the throat.  ESOPHOGRAM/BARIUM SWALLOW  Technique:  Single contrast examination was performed using thin barium.  Fluoroscopy time:  2.0 minutes.  Comparison:  No priors.  Findings:  Limited single contrast barium esophagram was performed. The esophagus was generally unremarkable, without evidence of obstructing mass, stricture, or  significant esophageal ring.  No hiatal hernia was evident.  However, occasional single swallow attempts demonstrated failure of the primary peristaltic wave. This was commonly followed by strong tertiary contractions.  Incidental note is made of dextrocardia.  Stomach is on the right side of the abdomen.  IMPRESSION: 1.  Limited single contrast barium esophagram was remarkable for a esophageal motility disorder, as described above. 2.  Situs inversus totalis.  Original Report Authenticated By: Florencia Reasons, M.D.    Assessment/Plan This is a 71 y/o female with situs inversus who developed a massive PE two months post  CABG.  Had right heart strain and residual clot noticed so IVC filter placed.  Active Problems:  Acute respiratory failure with hypoxia  Saddle embolism of pulmonary artery  Pericardial effusion  Atrial mass   Pulmonary Embolism (radiographically demonstrated, spiral CT and without hemodynamic compromise)  Lab 08/24/11 0500 08/23/11 0623 08/22/11 0450 08/21/11 0500 08/20/11 0700  INR 2.09* 1.95* 2.14* 2.64* 2.65*  PLAN -  Cont coumadin per pharmacy, will need anticoag for life (also remote hx DVT)  IVC filter in place r/t large clot burden RLE will plan to remove in 6 months if clot gone TED hose for post phlebotic syndrome   Acute hypoxic resp failure - r/t large saddle PE.  ?Bronchiectasis. Improving.  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 Once she is recovered from her acute illness it may be reasonable to refer her to [facility] for evaluation of this +/- bronchiectasis pulm hygiene O2 Pain control   CHF: volume up on exam -- appreciate cardiology management -- continue metolazone, K replaced  Dysphagia: -- swallowing eval  3/16  Recommend to continue current diet of dysphagia 3 with thin liquids with full supervision with all PO intake to provide cues as needed. MBS 3/21- -failure of primary peristalsis   DM - SSI protocol - metformin restarted 3/19  L atrial mass on TTE, - Per cardiac MRI shows clot. Cards following.  PLAN -  - already anticoagulated - appreciate cardiology input - F/U TTE in 6 months per cards for LA thrombus  Hearing Loss: due to sinus congestion;  Plan: -ENT consult if does not return to baseline -continue sinus regimen (saline)  Best practices / Disposition: --> tele floor status -->  SNF dc today - [place] -->DNR -->coumadin for DVT/PE -->dysphagia diet  Out pt FU with dr Kendrick Fries in [facility] Pager: [phone] If no response, call [phone]

## 2011-08-24 NOTE — Progress Notes (Signed)
ANTICOAGULATION CONSULT NOTE - Follow Up Consult  Pharmacy Consult for : Coumadin Indication: pulmonary embolus and DVT   Allergies  Allergen Reactions  . Penicillins Anaphylaxis    Patient Measurements: Height: 5\' 6"  (167.6 cm) Weight: 186 lb 11.7 oz (84.7 kg) IBW/kg (Calculated) : 59.3    Vital Signs: Temp: 97.4 F (36.3 C) (03/22 0335) Temp src: Oral (03/22 0335) BP: 113/69 mmHg (03/22 0335) Pulse Rate: 103  (03/22 0335)  Labs:  Basename 08/24/11 0500 08/23/11 0623 08/22/11 0450  HGB 11.9* -- 11.3*  HCT 38.5 -- 36.2  PLT 358 -- 355  APTT -- -- --  LABPROT 23.8* 22.6* 24.3*  INR 2.09* 1.95* 2.14*  HEPARINUNFRC -- -- <0.10*  CREATININE 0.89 -- 0.88  CKTOTAL -- -- --  CKMB -- -- --  TROPONINI -- -- --   Estimated Creatinine Clearance: 65.5 ml/min (by C-G formula based on Cr of 0.89).   Medications:  Scheduled:    . allopurinol  200 mg Oral Daily  . aspirin  325 mg Oral Daily  . gabapentin  300 mg Oral BID  . guaiFENesin  5-10 mL Oral TID  . insulin aspart  0-9 Units Subcutaneous TID WC  . metolazone  5 mg Oral Daily  . metoprolol tartrate  50 mg Oral BID  . pantoprazole sodium  40 mg Per Tube Q1200  . potassium chloride  40 mEq Oral Daily  . potassium chloride  40 mEq Oral Once  . simvastatin  20 mg Oral q1800  . sodium chloride  2 spray Each Nare QID  . warfarin  5 mg Oral ONCE-1800  . Warfarin - Pharmacist Dosing Inpatient   Does not apply q1800    Assessment:  Left atrial mass- Appears to be laminated thrombus in the left atrium. She is on anti-coagulation for her DVT/PE already. Will continue coumadin for lifetime.  Pulmonary emboli/DVT- s/p IVC filter and now therapeutic on coumadin.  INR 2.09.  No complications noted.  Goal of Therapy:   INR 2-3   Plan:   Coumadin 5 mg daily.  Keyandre Pileggi, Elisha Headland, Pharm.D. 08/24/2011 8:35 AM

## 2011-08-24 NOTE — Discharge Summary (Signed)
[facility]  [facility] Pager: [phone] If no response, call [phone]  

## 2011-08-28 ENCOUNTER — Other Ambulatory Visit (HOSPITAL_COMMUNITY): Payer: Medicare Other

## 2011-09-11 ENCOUNTER — Encounter: Payer: Self-pay | Admitting: Cardiovascular Disease

## 2011-09-11 ENCOUNTER — Ambulatory Visit (INDEPENDENT_AMBULATORY_CARE_PROVIDER_SITE_OTHER): Payer: Medicare Other | Admitting: Cardiovascular Disease

## 2011-09-11 VITALS — BP 106/73 | HR 94 | Ht 66.0 in | Wt 182.0 lb

## 2011-09-11 DIAGNOSIS — I2510 Atherosclerotic heart disease of native coronary artery without angina pectoris: Secondary | ICD-10-CM | POA: Insufficient documentation

## 2011-09-11 DIAGNOSIS — R222 Localized swelling, mass and lump, trunk: Secondary | ICD-10-CM

## 2011-09-11 DIAGNOSIS — I2692 Saddle embolus of pulmonary artery without acute cor pulmonale: Secondary | ICD-10-CM | POA: Insufficient documentation

## 2011-09-11 DIAGNOSIS — I5189 Other ill-defined heart diseases: Secondary | ICD-10-CM

## 2011-09-11 DIAGNOSIS — I6529 Occlusion and stenosis of unspecified carotid artery: Secondary | ICD-10-CM

## 2011-09-11 NOTE — Patient Instructions (Signed)
Your physician wants you to follow-up in: 6 months.  You will receive a reminder letter in the mail two months in advance. If you don't receive a letter, please call our office to schedule the follow-up appointment.  You have been referred to the coumadin clinic in our office. Please schedule an appt to be seen 1-2 days after your discharge from rehab facility.

## 2011-09-11 NOTE — Assessment & Plan Note (Signed)
Stable post bypass. Continue ASA, statin and beta blocker.

## 2011-09-11 NOTE — Assessment & Plan Note (Signed)
Will need carotid artery dopplers in October 2013. Will discuss at f/u.

## 2011-09-11 NOTE — Assessment & Plan Note (Signed)
Appears to be laminated thrombus by MRI. Continue coumadin.

## 2011-09-11 NOTE — Progress Notes (Signed)
History of Present Illness: [name] is a 71 y.o. female who presents for follow up. She was admitted 12/28-1/22/13. Initially admitted with symptoms of pancreatitis. Diagnosed with cholelithiasis and underwent ERCP with sphincterotomy and stone extraction. Following this, she developed pulmonary edema and ruled in for an NSTEMI. Stress testing was undertaken and demonstrated an EF 20% and possible situs inversus which limited interpretation of the study. LHC was performed 06/01/11. This confirmed dextrocardia. 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 EF was 40-45% by cardiac catheterization. Echocardiogram 06/05/11: EF 40-45%, anteroseptal hypokinesis, apical hypokinesis, mild LAE. CT scan was recommended and performed 1/8. This demonstrated situs inversus totalis. What would typically be called RCA arose from anterior sinus of Valsalva. What would typically be called the left main arises from the posterior sinus of Valsalva and gives rise to a large first diagonal branch and diminutive circumflex. She was then seen by Dr. Laneta Simmers of [facility]. CABG was recommended. Gen. Surgery felt that cholecystectomy could be done in the future as cardiac risk outweighed further intervention for her gallbladder. CABG was performed 06/14/11: RIMA-LAD, SVG-ramus, SVG-RCA.  Pre-CABG Dopplers 06/12/11: LICA 60-79%. She saw Tereso Newcomer, PA-C 07/10/11 for hospital follow up and was slowly progressing post bypass. I saw her August 07, 2011 and she had tachycardia with dyspnea. I discussed a CTA chest to exclude PE and a CXR but she refused.   She was admitted March 10,2013 to [facility] with right-sided pleuritic chest pain and dyspnea. She underwent CT scanning which showed a large saddle embolus. She was started on a heparin drip and admitted to the ICU for further evaluation. BNP and troponin were negative in ED. Echocardiogram  showed right heart strain and a possible mass in the left atrium. She did not meet criteria for thrombolysis. Bilateral lower extremity dopplers were positive for subacute DVT noted throughout the right lower extremity, including the posterior tibial, popliteal, femoral and common femoral veins. There was acute, occlusive DVT noted in the posterior tibial vein of the left lower extremity. She was started on heparin and coumadin. She continued to have high O2 requirements and decision was made to place IVC filter. Hospital course was complicated by volume overload in setting of RV strain secondary to PE / diastolic failure. She was aggressively diuresed to achieve negative balance. There was a questionable mass in the left atrium and cardiac MRI on 3/19 showed this to be consistent with laminated thrombus.  Patient complained of difficulty hearing and was evaluated by Audiologist. 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 She is recommended to have full ENT consultation for complete audiologic evaluation when discharged.   She is here today for cardiac follow up. She is feeling much better. Breathing is improved. No chest pains. She is losing weight. Right leg swelling is improved but still present. She is having no bleeding problems on coumadin.    Primary Care Physician:  Dr. Elias Else  Past Medical History  Diagnosis Date  . Arthritis   . Neuropathy   . GERD (gastroesophageal reflux disease)   . Hypertension   . HLD (hyperlipidemia)   . Situs inversus with dextrocardia     CT 06/2011: situs inversus totalis.  What would typically be called RCA arose from anterior sinus of Valsalva.  What would typically be called the left main  arises from the posterior sinus of Valsalva and gives rise to a large first diagonal branch and diminutive circumflex  . Dextrocardia   . Fracture 05/24/2011    right; "did not have surgery"  . DVT of leg  (deep venous thrombosis) ~2006    left  . Pneumonia 06/13/11    "couple times; long time ago"  . CAD (coronary artery disease)     NSTEMI in setting of gallstone pancreatitis 05/2011:  LHC with 3v CAD; CABG was performed 06/14/11: RIMA-LAD, SVG-ramus, SVG-RCA  . Ischemic cardiomyopathy     Echocardiogram 06/05/11: EF 40-45%, anteroseptal hypokinesis, apical hypokinesis, mild LAE  . Chronic bronchitis   . Anemia   . H/O hiatal hernia   . Chronic systolic heart failure   . Carotid stenosis     Dopplers 06/12/11: LICA 60-79%.    Past Surgical History  Procedure Date  . Ercp 06/03/2011    Procedure: ENDOSCOPIC RETROGRADE CHOLANGIOPANCREATOGRAPHY (ERCP);  Surgeon: Petra Kuba, MD;  Location: [facility] OR;  Service: Endoscopy;  Laterality: N/A;  . Vaginal hysterectomy 1970's  . Cardiac catheterization 06/11/11  . Coronary artery bypass graft 06/14/2011    Procedure: CORONARY ARTERY BYPASS GRAFTING (CABG);  Surgeon: Alleen Borne, MD;  Location: [facility] OR;  Service: Open Heart Surgery;  Laterality: N/A;    Current Outpatient Prescriptions  Medication Sig Dispense Refill  . allopurinol (ZYLOPRIM) 100 MG tablet Take 200 mg by mouth daily.        Marland Kitchen aspirin 325 MG tablet Take 325 mg by mouth daily.        . furosemide (LASIX) 20 MG tablet Take 20 mg by mouth daily. .      . gabapentin (NEURONTIN) 300 MG capsule Take 300 mg by mouth 2 (two) times daily.        . metFORMIN (GLUCOPHAGE) 850 MG tablet Take 850 mg by mouth daily with breakfast.       . metoprolol (LOPRESSOR) 50 MG tablet Take 50 mg by mouth 2 (two) times daily.      Marland Kitchen omeprazole (PRILOSEC) 20 MG capsule Take 20 mg by mouth every morning.        . potassium chloride SA (K-DUR,KLOR-CON) 20 MEQ tablet Take 2 tablets (40 mEq total) by mouth daily.      . promethazine (PHENERGAN) 25 MG tablet Take 25 mg by mouth every 6 (six) hours as needed.      . simvastatin (ZOCOR) 40 MG tablet Take 20 mg by mouth at bedtime.      . traMADol (ULTRAM) 50 MG tablet  Take 50 mg by mouth every 6 (six) hours as needed. For pain      . warfarin (COUMADIN) 5 MG tablet Take 1 tablet (5 mg total) by mouth daily at 6 PM.        Allergies  Allergen Reactions  . Penicillins Anaphylaxis    History   Social History  . Marital Status: Married    Spouse Name: N/A    Number of Children: N/A  . Years of Education: N/A   Occupational History  . Not on file.   Social History Main Topics  . Smoking status: Never Smoker   . Smokeless tobacco: Never Used  . Alcohol Use: No  . Drug Use: No  . Sexually Active: No   Other Topics Concern  . Not on file   Social History Narrative  . No narrative on file    No family history on file.  Review of Systems:  As stated in the HPI and otherwise negative.   BP 106/73  Pulse 94  Ht 5\' 6"  (1.676 m)  Wt 182 lb (82.555 kg)  BMI 29.38 kg/m2  Physical Examination: General: Well developed, well nourished, NAD HEENT: OP clear, mucus membranes moist SKIN: warm, dry. No rashes. Neuro: No focal deficits Musculoskeletal: Muscle strength 5/5 all ext Psychiatric: Mood and affect normal Neck: No JVD, no carotid bruits, no thyromegaly, no lymphadenopathy. Lungs:Clear bilaterally, no wheezes, rhonci, crackles Cardiovascular: Regular rate and rhythm. No murmurs, gallops or rubs. Abdomen:Soft. Bowel sounds present. Non-tender.  Extremities: 2+ right  lower extremity edema. Pulses are 2 + in the bilateral DP/PT.

## 2011-09-11 NOTE — Assessment & Plan Note (Signed)
She is on coumadin therapy and will need this for lifetime. Breathing better.

## 2011-09-27 ENCOUNTER — Ambulatory Visit (INDEPENDENT_AMBULATORY_CARE_PROVIDER_SITE_OTHER): Payer: Medicare Other | Admitting: *Deleted

## 2011-09-27 DIAGNOSIS — I2692 Saddle embolus of pulmonary artery without acute cor pulmonale: Secondary | ICD-10-CM

## 2011-09-27 DIAGNOSIS — I2510 Atherosclerotic heart disease of native coronary artery without angina pectoris: Secondary | ICD-10-CM

## 2011-09-27 LAB — POCT INR: INR: 1.3

## 2011-09-27 MED ORDER — WARFARIN SODIUM 2 MG PO TABS: 2.0000 mg | ORAL_TABLET | ORAL | Status: DC

## 2011-09-27 NOTE — Patient Instructions (Signed)

## 2011-10-04 ENCOUNTER — Ambulatory Visit (INDEPENDENT_AMBULATORY_CARE_PROVIDER_SITE_OTHER): Payer: Medicare Other

## 2011-10-04 DIAGNOSIS — I2692 Saddle embolus of pulmonary artery without acute cor pulmonale: Secondary | ICD-10-CM

## 2011-10-04 DIAGNOSIS — I2510 Atherosclerotic heart disease of native coronary artery without angina pectoris: Secondary | ICD-10-CM

## 2011-10-04 LAB — POCT INR: INR: 1.2

## 2011-10-08 ENCOUNTER — Encounter: Payer: Self-pay | Admitting: Pulmonary Disease

## 2011-10-08 ENCOUNTER — Ambulatory Visit (INDEPENDENT_AMBULATORY_CARE_PROVIDER_SITE_OTHER): Payer: Medicare Other | Admitting: Pulmonary Disease

## 2011-10-08 DIAGNOSIS — I2692 Saddle embolus of pulmonary artery without acute cor pulmonale: Secondary | ICD-10-CM

## 2011-10-08 DIAGNOSIS — Q348 Other specified congenital malformations of respiratory system: Secondary | ICD-10-CM

## 2011-10-08 DIAGNOSIS — Q893 Situs inversus: Secondary | ICD-10-CM

## 2011-10-08 DIAGNOSIS — J479 Bronchiectasis, uncomplicated: Secondary | ICD-10-CM

## 2011-10-08 DIAGNOSIS — O2230 Deep phlebothrombosis in pregnancy, unspecified trimester: Secondary | ICD-10-CM

## 2011-10-08 DIAGNOSIS — I82401 Acute embolism and thrombosis of unspecified deep veins of right lower extremity: Secondary | ICD-10-CM

## 2011-10-08 NOTE — Patient Instructions (Signed)
Continue taking the warfarin as you are doing.  In 3-4 months we will repeat an ultrasound of your leg to see if the clot is still there. Continue using the flutter valve as you are doing. If you want to see the Primary Ciliary Diskinesia clinic at [facility] please let us know. We will see you back in 4 months after the ultrasound.

## 2011-10-08 NOTE — Assessment & Plan Note (Signed)
Causing bronchiectasis.  Her symptoms are minimal now with minimal sputum production.  For now I see no indication for inhaled therapies.  She will continue using the flutter valve per my recommendation.  I encouraged her to have a visit with Dr. Skeet Simmer at [facility], but she would prefer to stay away from hospitals for now which is understandable.  We will refer her if she desires.  Will obtain simple spirometry today.

## 2011-10-08 NOTE — Assessment & Plan Note (Signed)
[name] suffered a submassive (but VERY large) saddle embolism on 08/2011 and is recovering well.  Her leg swelling from the DVT is improved.  Based on the size of the embolism and the clot in her left atrium she needs to be anti-coagulated for life.  I would be OK with a switch to Rivaroxiban if she has trouble with the warfarin, but for now she is doing well with warfarin so we will leave things as they are.  We will repeat a lower extremity ultrasound in 3 months and if the DVT is gone (right sided) we will send her to IR at [facility] to have the IVC filter removed.

## 2011-10-08 NOTE — Progress Notes (Signed)
Addended by: Christen Butter on: 10/08/2011 04:00 PM   Modules accepted: Orders

## 2011-10-08 NOTE — Progress Notes (Signed)
Subjective:    Patient ID: [name], female    DOB: May 23, 1941, 71 y.o.   MRN: [id number]  Synopsis: [name] is a pleasant 71 y/o femal who has Primary Ciliary Diskinesia (presumptive diagnosis, has situs inversus and bronchiectasis) who underwent a 3 vessel CABG in January 2013.  Her post operative course was complicated submassive saddle embolism in 08/2011 and a flare of her bronchiectasis.  She was discharged to a SNF where she participated in rehab for 30 days.    HPI See her synopsis above.  [name] has done really well since leaving the nursing facility two weeks ago.  Her difficulty breathing has improved dramatically.  Her leg swelling has improved but is still notable with mild tenderness.  Her cough has improved as well and she only rarely makes sputum.  Her sinus congestion has also improved which she attributes to staying away from the pollen. She continues to use the flutter valve two times per day as well as the incentive spirometry machine.    Past Medical History  Diagnosis Date  . Arthritis   . Neuropathy   . GERD (gastroesophageal reflux disease)   . Hypertension   . HLD (hyperlipidemia)   . Situs inversus with dextrocardia     CT 06/2011: situs inversus totalis.  What would typically be called RCA arose from anterior sinus of Valsalva.  What would typically be called the left main arises from the posterior sinus of Valsalva and gives rise to a large first diagonal branch and diminutive circumflex  . Dextrocardia   . Fracture 05/24/2011    right; "did not have surgery"  . DVT of leg (deep venous thrombosis) ~2006    left  . Pneumonia 06/13/11    "couple times; long time ago"  . CAD (coronary artery disease)     NSTEMI in setting of gallstone pancreatitis 05/2011:  LHC with 3v CAD; CABG was performed 06/14/11: RIMA-LAD, SVG-ramus, SVG-RCA  . Ischemic cardiomyopathy     Echocardiogram 06/05/11: EF 40-45%, anteroseptal hypokinesis, apical hypokinesis, mild LAE    . Chronic bronchitis   . Anemia   . H/O hiatal hernia   . Chronic systolic heart failure   . Carotid stenosis     Dopplers 06/12/11: LICA 60-79%.  . Pulmonary embolus March 2013     No family history on file.   History   Social History  . Marital Status: Married    Spouse Name: N/A    Number of Children: N/A  . Years of Education: N/A   Occupational History  . Not on file.   Social History Main Topics  . Smoking status: Never Smoker   . Smokeless tobacco: Never Used  . Alcohol Use: No  . Drug Use: No  . Sexually Active: No   Other Topics Concern  . Not on file   Social History Narrative  . No narrative on file     Allergies  Allergen Reactions  . Penicillins Anaphylaxis     Outpatient Prescriptions Prior to Visit  Medication Sig Dispense Refill  . allopurinol (ZYLOPRIM) 100 MG tablet Take 200 mg by mouth daily.        Marland Kitchen aspirin 325 MG tablet Take 325 mg by mouth daily.        . furosemide (LASIX) 20 MG tablet Take 20 mg by mouth daily. .      . gabapentin (NEURONTIN) 300 MG capsule Take 300 mg by mouth 2 (two) times daily.        Marland Kitchen  metoprolol (LOPRESSOR) 50 MG tablet Take 50 mg by mouth 2 (two) times daily.      Marland Kitchen omeprazole (PRILOSEC) 20 MG capsule Take 20 mg by mouth every morning.        . promethazine (PHENERGAN) 25 MG tablet Take 25 mg by mouth every 6 (six) hours as needed.      . simvastatin (ZOCOR) 40 MG tablet Take 20 mg by mouth at bedtime.      . traMADol (ULTRAM) 50 MG tablet Take 50 mg by mouth every 6 (six) hours as needed. For pain      . warfarin (COUMADIN) 2 MG tablet Take 1 tablet (2 mg total) by mouth as directed. Take as directed by coumadin clinic  40 tablet  1  . metFORMIN (GLUCOPHAGE) 850 MG tablet Take 850 mg by mouth daily with breakfast.       . potassium chloride SA (K-DUR,KLOR-CON) 20 MEQ tablet Take 2 tablets (40 mEq total) by mouth daily.      Marland Kitchen warfarin (COUMADIN) 5 MG tablet Take 1 tablet (5 mg total) by mouth daily at 6 PM.             Review of Systems  Constitutional: Negative for fever, chills, activity change, appetite change and fatigue.  HENT: Negative for congestion, rhinorrhea, sneezing and postnasal drip.   Respiratory: Positive for cough. Negative for chest tightness, shortness of breath and wheezing.   Cardiovascular: Positive for leg swelling. Negative for chest pain and palpitations.  Musculoskeletal: Positive for myalgias and arthralgias. Negative for joint swelling and gait problem.       Objective:   Physical Exam  Filed Vitals:   10/08/11 1501  BP: 100/62  Pulse: 90  Temp: 98.1 F (36.7 C)  TempSrc: Oral  Height: 5\' 6"  (1.676 m)  Weight: 186 lb 12.8 oz (84.732 kg)  SpO2: 93%   Gen: well appearing, no acute distress HEENT: NCAT, PERRL, EOMi, OP clear, neck supple without masses PULM: Insp rhonchi throughout bilaterall CV: Midline scar well healed, cardiac sounds on right, nl s1/s2 AB: BS+, soft, nontender, no hsm Ext: warm, trace pitting edema R leg, no clubbing, no cyanosis Derm: no rash or skin breakdown Neuro: A&Ox4, CN II-XII intact, strength 5/5 in all 4 extremities  10/08/2011 Simple spirometry: inadequate effort so not acceptable, however flow curves show obstruction and a F/F ratio of 68%      Assessment & Plan:   Saddle embolism of pulmonary artery [name] suffered a submassive (but VERY large) saddle embolism on 08/2011 and is recovering well.  Her leg swelling from the DVT is improved.  Based on the size of the embolism and the clot in her left atrium she needs to be anti-coagulated for life.  I would be OK with a switch to Rivaroxiban if she has trouble with the warfarin, but for now she is doing well with warfarin so we will leave things as they are.  We will repeat a lower extremity ultrasound in 3 months and if the DVT is gone (right sided) we will send her to IR at [facility] to have the IVC filter removed.  Primary ciliary dyskinesia Causing bronchiectasis.   Her symptoms are minimal now with minimal sputum production.  For now I see no indication for inhaled therapies.  She will continue using the flutter valve per my recommendation.  I encouraged her to have a visit with Dr. Skeet Simmer at [facility] clinic, but she would prefer to stay away from hospitals  for now which is understandable.  We will refer her if she desires.  Will obtain simple spirometry today.    Updated Medication List Outpatient Encounter Prescriptions as of 10/08/2011  Medication Sig Dispense Refill  . allopurinol (ZYLOPRIM) 100 MG tablet Take 200 mg by mouth daily.        Marland Kitchen aspirin 325 MG tablet Take 325 mg by mouth daily.        . furosemide (LASIX) 20 MG tablet Take 20 mg by mouth daily. .      . gabapentin (NEURONTIN) 300 MG capsule Take 300 mg by mouth 2 (two) times daily.        . Magnesium 500 MG CAPS Take 1 capsule by mouth daily.      . metFORMIN (GLUCOPHAGE) 500 MG tablet Take 500 mg by mouth daily with breakfast.      . metoprolol (LOPRESSOR) 50 MG tablet Take 50 mg by mouth 2 (two) times daily.      Marland Kitchen omeprazole (PRILOSEC) 20 MG capsule Take 20 mg by mouth every morning.        . promethazine (PHENERGAN) 25 MG tablet Take 25 mg by mouth every 6 (six) hours as needed.      . simvastatin (ZOCOR) 40 MG tablet Take 20 mg by mouth at bedtime.      . traMADol (ULTRAM) 50 MG tablet Take 50 mg by mouth every 6 (six) hours as needed. For pain      . warfarin (COUMADIN) 2 MG tablet Take 4 mg by mouth as directed. Take as directed by coumadin clinic      . DISCONTD: warfarin (COUMADIN) 2 MG tablet Take 1 tablet (2 mg total) by mouth as directed. Take as directed by coumadin clinic  40 tablet  1  . DISCONTD: metFORMIN (GLUCOPHAGE) 850 MG tablet Take 850 mg by mouth daily with breakfast.       . DISCONTD: potassium chloride SA (K-DUR,KLOR-CON) 20 MEQ tablet Take 2 tablets (40 mEq total) by mouth daily.      Marland Kitchen DISCONTD: warfarin (COUMADIN) 5 MG tablet Take 1 tablet (5 mg total) by  mouth daily at 6 PM.

## 2011-10-10 ENCOUNTER — Ambulatory Visit (INDEPENDENT_AMBULATORY_CARE_PROVIDER_SITE_OTHER): Payer: Medicare Other

## 2011-10-10 DIAGNOSIS — I2510 Atherosclerotic heart disease of native coronary artery without angina pectoris: Secondary | ICD-10-CM

## 2011-10-10 DIAGNOSIS — I2692 Saddle embolus of pulmonary artery without acute cor pulmonale: Secondary | ICD-10-CM

## 2011-10-10 LAB — POCT INR: INR: 1.3

## 2011-10-17 ENCOUNTER — Ambulatory Visit (INDEPENDENT_AMBULATORY_CARE_PROVIDER_SITE_OTHER): Payer: Medicare Other

## 2011-10-17 DIAGNOSIS — I2510 Atherosclerotic heart disease of native coronary artery without angina pectoris: Secondary | ICD-10-CM

## 2011-10-17 DIAGNOSIS — I2692 Saddle embolus of pulmonary artery without acute cor pulmonale: Secondary | ICD-10-CM

## 2011-10-17 LAB — POCT INR: INR: 1.7

## 2011-10-24 ENCOUNTER — Ambulatory Visit (INDEPENDENT_AMBULATORY_CARE_PROVIDER_SITE_OTHER): Payer: Medicare Other

## 2011-10-24 DIAGNOSIS — I2510 Atherosclerotic heart disease of native coronary artery without angina pectoris: Secondary | ICD-10-CM

## 2011-10-24 DIAGNOSIS — I2692 Saddle embolus of pulmonary artery without acute cor pulmonale: Secondary | ICD-10-CM

## 2011-10-31 ENCOUNTER — Ambulatory Visit (INDEPENDENT_AMBULATORY_CARE_PROVIDER_SITE_OTHER): Payer: Medicare Other

## 2011-10-31 DIAGNOSIS — I2692 Saddle embolus of pulmonary artery without acute cor pulmonale: Secondary | ICD-10-CM

## 2011-10-31 DIAGNOSIS — I2510 Atherosclerotic heart disease of native coronary artery without angina pectoris: Secondary | ICD-10-CM

## 2011-10-31 LAB — POCT INR: INR: 2.1

## 2011-10-31 MED ORDER — WARFARIN SODIUM 2 MG PO TABS: ORAL_TABLET | ORAL | Status: DC

## 2011-11-14 ENCOUNTER — Ambulatory Visit (INDEPENDENT_AMBULATORY_CARE_PROVIDER_SITE_OTHER): Payer: Medicare Other

## 2011-11-14 DIAGNOSIS — I2692 Saddle embolus of pulmonary artery without acute cor pulmonale: Secondary | ICD-10-CM

## 2011-11-14 DIAGNOSIS — I2510 Atherosclerotic heart disease of native coronary artery without angina pectoris: Secondary | ICD-10-CM

## 2011-12-05 ENCOUNTER — Ambulatory Visit (INDEPENDENT_AMBULATORY_CARE_PROVIDER_SITE_OTHER): Payer: Medicare Other | Admitting: *Deleted

## 2011-12-05 DIAGNOSIS — I2692 Saddle embolus of pulmonary artery without acute cor pulmonale: Secondary | ICD-10-CM

## 2011-12-05 DIAGNOSIS — I2510 Atherosclerotic heart disease of native coronary artery without angina pectoris: Secondary | ICD-10-CM

## 2011-12-05 LAB — POCT INR: INR: 2.6

## 2012-01-02 ENCOUNTER — Ambulatory Visit (INDEPENDENT_AMBULATORY_CARE_PROVIDER_SITE_OTHER): Payer: Medicare Other

## 2012-01-02 DIAGNOSIS — I2692 Saddle embolus of pulmonary artery without acute cor pulmonale: Secondary | ICD-10-CM

## 2012-01-02 DIAGNOSIS — I2510 Atherosclerotic heart disease of native coronary artery without angina pectoris: Secondary | ICD-10-CM

## 2012-01-02 LAB — POCT INR: INR: 5.6

## 2012-01-16 ENCOUNTER — Ambulatory Visit (INDEPENDENT_AMBULATORY_CARE_PROVIDER_SITE_OTHER): Payer: Medicare Other | Admitting: *Deleted

## 2012-01-16 DIAGNOSIS — I2692 Saddle embolus of pulmonary artery without acute cor pulmonale: Secondary | ICD-10-CM

## 2012-01-16 DIAGNOSIS — I2510 Atherosclerotic heart disease of native coronary artery without angina pectoris: Secondary | ICD-10-CM

## 2012-01-30 ENCOUNTER — Ambulatory Visit (INDEPENDENT_AMBULATORY_CARE_PROVIDER_SITE_OTHER): Payer: Medicare Other | Admitting: *Deleted

## 2012-01-30 DIAGNOSIS — I2510 Atherosclerotic heart disease of native coronary artery without angina pectoris: Secondary | ICD-10-CM

## 2012-01-30 DIAGNOSIS — I2692 Saddle embolus of pulmonary artery without acute cor pulmonale: Secondary | ICD-10-CM

## 2012-01-30 LAB — POCT INR: INR: 4.2

## 2012-02-13 ENCOUNTER — Ambulatory Visit (INDEPENDENT_AMBULATORY_CARE_PROVIDER_SITE_OTHER): Payer: Medicare Other

## 2012-02-13 DIAGNOSIS — I2510 Atherosclerotic heart disease of native coronary artery without angina pectoris: Secondary | ICD-10-CM

## 2012-02-13 DIAGNOSIS — I2692 Saddle embolus of pulmonary artery without acute cor pulmonale: Secondary | ICD-10-CM

## 2012-02-13 LAB — POCT INR: INR: 3.7

## 2012-02-27 ENCOUNTER — Ambulatory Visit (INDEPENDENT_AMBULATORY_CARE_PROVIDER_SITE_OTHER): Payer: Medicare Other

## 2012-02-27 DIAGNOSIS — I2510 Atherosclerotic heart disease of native coronary artery without angina pectoris: Secondary | ICD-10-CM

## 2012-02-27 DIAGNOSIS — I2692 Saddle embolus of pulmonary artery without acute cor pulmonale: Secondary | ICD-10-CM

## 2012-03-03 ENCOUNTER — Ambulatory Visit: Payer: Self-pay | Admitting: Pulmonary Disease

## 2012-03-03 ENCOUNTER — Telehealth: Payer: Self-pay | Admitting: Pulmonary Disease

## 2012-03-03 NOTE — Telephone Encounter (Signed)
Order faxed. [name], CMA  

## 2012-03-08 ENCOUNTER — Encounter: Payer: Self-pay | Admitting: Pulmonary Disease

## 2012-03-10 ENCOUNTER — Telehealth: Payer: Self-pay | Admitting: *Deleted

## 2012-03-10 NOTE — Telephone Encounter (Signed)
Message copied by Christen Butter on Mon Mar 10, 2012  9:43 AM ------      Message from: Lupita Leash      Created: Sat Mar 08, 2012  6:21 AM       L,            Please let her know that she still has clot in her leg and we will need to keep the IVC filter in for now.            B

## 2012-03-10 NOTE — Telephone Encounter (Signed)
Spoke with pt and notified of recs per Dr. Kendrick Fries. Pt verbalized understanding and states no questions/concerns.

## 2012-03-12 ENCOUNTER — Ambulatory Visit (INDEPENDENT_AMBULATORY_CARE_PROVIDER_SITE_OTHER): Payer: Medicare Other

## 2012-03-12 DIAGNOSIS — I2510 Atherosclerotic heart disease of native coronary artery without angina pectoris: Secondary | ICD-10-CM

## 2012-03-12 DIAGNOSIS — I2692 Saddle embolus of pulmonary artery without acute cor pulmonale: Secondary | ICD-10-CM

## 2012-03-20 ENCOUNTER — Ambulatory Visit: Payer: Medicare Other | Admitting: Cardiovascular Disease

## 2012-04-01 ENCOUNTER — Encounter: Payer: Self-pay | Admitting: Cardiovascular Disease

## 2012-04-01 ENCOUNTER — Ambulatory Visit (INDEPENDENT_AMBULATORY_CARE_PROVIDER_SITE_OTHER): Payer: Medicare Other | Admitting: Cardiovascular Disease

## 2012-04-01 ENCOUNTER — Ambulatory Visit (INDEPENDENT_AMBULATORY_CARE_PROVIDER_SITE_OTHER): Payer: Medicare Other | Admitting: *Deleted

## 2012-04-01 VITALS — BP 123/74 | HR 81 | Ht 66.0 in | Wt 196.8 lb

## 2012-04-01 DIAGNOSIS — I313 Pericardial effusion (noninflammatory): Secondary | ICD-10-CM

## 2012-04-01 DIAGNOSIS — I2692 Saddle embolus of pulmonary artery without acute cor pulmonale: Secondary | ICD-10-CM

## 2012-04-01 DIAGNOSIS — I6529 Occlusion and stenosis of unspecified carotid artery: Secondary | ICD-10-CM

## 2012-04-01 DIAGNOSIS — I2510 Atherosclerotic heart disease of native coronary artery without angina pectoris: Secondary | ICD-10-CM

## 2012-04-01 DIAGNOSIS — I25810 Atherosclerosis of coronary artery bypass graft(s) without angina pectoris: Secondary | ICD-10-CM

## 2012-04-01 DIAGNOSIS — I82409 Acute embolism and thrombosis of unspecified deep veins of unspecified lower extremity: Secondary | ICD-10-CM

## 2012-04-01 DIAGNOSIS — I319 Disease of pericardium, unspecified: Secondary | ICD-10-CM

## 2012-04-01 DIAGNOSIS — I2699 Other pulmonary embolism without acute cor pulmonale: Secondary | ICD-10-CM

## 2012-04-01 MED ORDER — WARFARIN SODIUM 2 MG PO TABS: ORAL_TABLET | ORAL | Status: DC

## 2012-04-01 NOTE — Patient Instructions (Addendum)
Your physician wants you to follow-up in:  6 months.  You will receive a reminder letter in the mail two months in advance. If you don't receive a letter, please call our office to schedule the follow-up appointment.  Your physician has requested that you have an echocardiogram. Echocardiography is a painless test that uses sound waves to create images of your heart. It provides your doctor with information about the size and shape of your heart and how well your heart's chambers and valves are working. This procedure takes approximately one hour. There are no restrictions for this procedure. To be done in November 2013   Your physician has requested that you have a carotid duplex. This test is an ultrasound of the carotid arteries in your neck. It looks at blood flow through these arteries that supply the brain with blood. Allow one hour for this exam. There are no restrictions or special instructions. To be done in January 2014

## 2012-04-01 NOTE — Progress Notes (Signed)
History of Present Illness: [name] is a 71 y.o. female who presents for cardiac follow up. She was admitted 12/28-1/22/13. Initially admitted with symptoms of pancreatitis. Diagnosed with cholelithiasis and underwent ERCP with sphincterotomy and stone extraction. Following this, she developed pulmonary edema and ruled in for an NSTEMI. Stress testing was undertaken and demonstrated an EF 20% and possible situs inversus which limited interpretation of the study. LHC was performed 06/01/11. This confirmed dextrocardia. 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 EF was 40-45% by cardiac catheterization. Echocardiogram 06/05/11: EF 40-45%, anteroseptal hypokinesis, apical hypokinesis, mild LAE. CT scan was recommended and performed 06/12/11. This demonstrated situs inversus totalis. What would typically be called RCA arose from anterior sinus of Valsalva. What would typically be called the left main arises from the posterior sinus of Valsalva and gives rise to a large first diagonal branch and diminutive circumflex. She was then seen by Dr. Laneta Simmers of [facility]. CABG was recommended. Gen. Surgery felt that cholecystectomy could be done in the future as cardiac risk outweighed further intervention for her gallbladder. CABG was performed 06/14/11: RIMA-LAD, SVG-ramus, SVG-RCA. Pre-CABG Dopplers 06/12/11: LICA 60-79%. I saw her in March and she was c/o dyspnea. I discussed a CTA chest to exclude PE and a CXR but she refused. She was admitted March 10,2013 to [facility] with right-sided pleuritic chest pain and dyspnea. She underwent CT scanning which showed a large saddle embolus. She was started on a heparin drip and admitted to the ICU for further evaluation. BNP and troponin were negative in ED. Echocardiogram showed right heart strain and a possible mass in the left atrium as well as moderate sized pericardial  effusion, LVEF 50-55%.  She did not meet criteria for thrombolysis. Bilateral lower extremity dopplers were positive for subacute DVT noted throughout the right lower extremity, including the posterior tibial, popliteal, femoral and common femoral veins. There was acute, occlusive DVT noted in the posterior tibial vein of the left lower extremity. She was started on heparin and coumadin. She continued to have high O2 requirements and decision was made to place IVC filter. Hospital course was complicated by volume overload in setting of RV strain secondary to PE / diastolic failure. She was aggressively diuresed to achieve negative balance. 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   She is here today for cardiac follow up. Breathing is improved. No chest pains. She is having no bleeding problems on coumadin. She is feeling well.   Primary Care Physician: Elias Else  Last Lipid Profile:Lipid Panel     Component Value Date/Time   CHOL 124 06/14/2011 0400   TRIG 91 06/14/2011 0400   HDL 36* 06/14/2011 0400   CHOLHDL 3.4 06/14/2011 0400   VLDL 18 06/14/2011 0400   LDLCALC 70 06/14/2011 0400     Past Medical History  Diagnosis Date  . Arthritis   . Neuropathy   . GERD (gastroesophageal reflux disease)   . Hypertension   . HLD (hyperlipidemia)   . Situs inversus with dextrocardia     CT 06/2011: situs inversus totalis.  What would typically be called RCA arose from anterior sinus of Valsalva.  What would typically be called the left main arises from the posterior sinus of Valsalva and gives rise to a large first diagonal branch and diminutive circumflex  .  Dextrocardia   . Fracture 05/24/2011    right; "did not have surgery"  . DVT of leg (deep venous thrombosis) ~2006    left  . Pneumonia 06/13/11    "couple times; long time ago"  . CAD (coronary artery disease)     NSTEMI in setting of gallstone  pancreatitis 05/2011:  LHC with 3v CAD; CABG was performed 06/14/11: RIMA-LAD, SVG-ramus, SVG-RCA  . Ischemic cardiomyopathy     Echocardiogram 06/05/11: EF 40-45%, anteroseptal hypokinesis, apical hypokinesis, mild LAE  . Chronic bronchitis   . Anemia   . H/O hiatal hernia   . Chronic systolic heart failure   . Carotid stenosis     Dopplers 06/12/11: LICA 60-79%.  . Pulmonary embolus March 2013    Past Surgical History  Procedure Date  . Ercp 06/03/2011    Procedure: ENDOSCOPIC RETROGRADE CHOLANGIOPANCREATOGRAPHY (ERCP);  Surgeon: Petra Kuba, MD;  Location: [facility] OR;  Service: Endoscopy;  Laterality: N/A;  . Vaginal hysterectomy 1970's  . Cardiac catheterization 06/11/11  . Coronary artery bypass graft 06/14/2011    Procedure: CORONARY ARTERY BYPASS GRAFTING (CABG);  Surgeon: Alleen Borne, MD;  Location: [facility] OR;  Service: Open Heart Surgery;  Laterality: N/A;    Current Outpatient Prescriptions  Medication Sig Dispense Refill  . allopurinol (ZYLOPRIM) 100 MG tablet Take 200 mg by mouth daily.        Marland Kitchen aspirin 325 MG tablet Take 325 mg by mouth daily.        . furosemide (LASIX) 20 MG tablet Take 20 mg by mouth daily. .      . gabapentin (NEURONTIN) 300 MG capsule Take 300 mg by mouth 2 (two) times daily.        . Magnesium 500 MG CAPS Take 1 capsule by mouth daily.      . metFORMIN (GLUCOPHAGE) 500 MG tablet Take 500 mg by mouth daily with breakfast.      . metoprolol (LOPRESSOR) 50 MG tablet Take 50 mg by mouth 2 (two) times daily.      Marland Kitchen omeprazole (PRILOSEC) 20 MG capsule Take 20 mg by mouth every morning.        . promethazine (PHENERGAN) 25 MG tablet Take 25 mg by mouth every 6 (six) hours as needed.      . simvastatin (ZOCOR) 40 MG tablet Take 20 mg by mouth at bedtime.      . traMADol (ULTRAM) 50 MG tablet Take 50 mg by mouth every 6 (six) hours as needed. For pain      . warfarin (COUMADIN) 2 MG tablet Take as directed by coumadin clinic  90 tablet  3    Allergies  Allergen  Reactions  . Penicillins Anaphylaxis    History   Social History  . Marital Status: Married    Spouse Name: N/A    Number of Children: N/A  . Years of Education: N/A   Occupational History  . Not on file.   Social History Main Topics  . Smoking status: Never Smoker   . Smokeless tobacco: Never Used  . Alcohol Use: No  . Drug Use: No  . Sexually Active: No   Other Topics Concern  . Not on file   Social History Narrative  . No narrative on file    No family history on file.  Review of Systems:  As stated in the HPI and otherwise negative.   BP 123/74  Pulse 81  Ht 5\' 6"  (1.676 m)  Wt 196 lb 12.8  oz (89.268 kg)  BMI 31.76 kg/m2  Physical Examination: General: Well developed, well nourished, NAD HEENT: OP clear, mucus membranes moist SKIN: warm, dry. No rashes. Neuro: No focal deficits Musculoskeletal: Muscle strength 5/5 all ext Psychiatric: Mood and affect normal Neck: No JVD, no carotid bruits, no thyromegaly, no lymphadenopathy. Lungs:Clear bilaterally, no wheezes, rhonci, crackles Cardiovascular: Regular rate and rhythm. No murmurs, gallops or rubs. Abdomen:Soft. Bowel sounds present. Non-tender.  Extremities: No lower extremity edema. Pulses are 2 + in the bilateral DP/PT.   Assessment and Plan:   1. Saddle embolus of pulmonary artery/CVT: She is on coumadin therapy and will need this for lifetime.  2. Carotid stenosis: Will need carotid artery dopplers arranged today. 60-79% stenosis RICA in January 2013.   3. CAD: Stable post bypass. Continue ASA, statin and beta blocker.  4. 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 Continue coumadin.   5. Pericardial effusion: Moderate sized pericardial effusion post CABG with no tamponade by echo in march 2013. Will repeat echo.

## 2012-04-15 ENCOUNTER — Other Ambulatory Visit (HOSPITAL_COMMUNITY): Payer: Medicare Other

## 2012-04-21 ENCOUNTER — Other Ambulatory Visit (HOSPITAL_COMMUNITY): Payer: Medicare Other

## 2012-04-22 ENCOUNTER — Ambulatory Visit (HOSPITAL_COMMUNITY): Payer: Medicare Other | Attending: Cardiology

## 2012-04-22 DIAGNOSIS — I2589 Other forms of chronic ischemic heart disease: Secondary | ICD-10-CM | POA: Insufficient documentation

## 2012-04-22 DIAGNOSIS — I2510 Atherosclerotic heart disease of native coronary artery without angina pectoris: Secondary | ICD-10-CM | POA: Insufficient documentation

## 2012-04-22 DIAGNOSIS — I25810 Atherosclerosis of coronary artery bypass graft(s) without angina pectoris: Secondary | ICD-10-CM

## 2012-04-22 DIAGNOSIS — Z86718 Personal history of other venous thrombosis and embolism: Secondary | ICD-10-CM | POA: Insufficient documentation

## 2012-04-22 DIAGNOSIS — I319 Disease of pericardium, unspecified: Secondary | ICD-10-CM | POA: Insufficient documentation

## 2012-04-22 DIAGNOSIS — Z86711 Personal history of pulmonary embolism: Secondary | ICD-10-CM | POA: Insufficient documentation

## 2012-04-22 DIAGNOSIS — I059 Rheumatic mitral valve disease, unspecified: Secondary | ICD-10-CM | POA: Insufficient documentation

## 2012-04-22 DIAGNOSIS — Q248 Other specified congenital malformations of heart: Secondary | ICD-10-CM | POA: Insufficient documentation

## 2012-04-22 DIAGNOSIS — I779 Disorder of arteries and arterioles, unspecified: Secondary | ICD-10-CM | POA: Insufficient documentation

## 2012-04-22 NOTE — Progress Notes (Signed)
Echocardiogram performed.  

## 2012-04-30 ENCOUNTER — Ambulatory Visit (INDEPENDENT_AMBULATORY_CARE_PROVIDER_SITE_OTHER): Payer: Medicare Other

## 2012-04-30 DIAGNOSIS — I2692 Saddle embolus of pulmonary artery without acute cor pulmonale: Secondary | ICD-10-CM

## 2012-04-30 DIAGNOSIS — I2510 Atherosclerotic heart disease of native coronary artery without angina pectoris: Secondary | ICD-10-CM

## 2012-05-03 ENCOUNTER — Inpatient Hospital Stay: Payer: Self-pay | Admitting: Surgery

## 2012-05-03 LAB — COMPREHENSIVE METABOLIC PANEL
Albumin: 4.1 g/dL (ref 3.4–5.0)
Alkaline Phosphatase: 158 U/L — ABNORMAL HIGH (ref 50–136)
Anion Gap: 10 (ref 7–16)
Bilirubin,Total: 0.5 mg/dL (ref 0.2–1.0)
Co2: 27 mmol/L (ref 21–32)
Creatinine: 1.59 mg/dL — ABNORMAL HIGH (ref 0.60–1.30)
Glucose: 177 mg/dL — ABNORMAL HIGH (ref 65–99)
Osmolality: 288 (ref 275–301)
SGOT(AST): 23 U/L (ref 15–37)
Sodium: 138 mmol/L (ref 136–145)
Total Protein: 8.5 g/dL — ABNORMAL HIGH (ref 6.4–8.2)

## 2012-05-03 LAB — URINALYSIS, COMPLETE
Bacteria: NONE SEEN
Bilirubin,UR: NEGATIVE
Glucose,UR: NEGATIVE mg/dL (ref 0–75)
Leukocyte Esterase: NEGATIVE
Nitrite: NEGATIVE
Ph: 6 (ref 4.5–8.0)
Protein: NEGATIVE
RBC,UR: <1 /HPF (ref 0–5)
Specific Gravity: 1.014 (ref 1.003–1.030)

## 2012-05-03 LAB — PROTIME-INR
INR: 1.6
Prothrombin Time: 19.1 secs — ABNORMAL HIGH (ref 11.5–14.7)

## 2012-05-03 LAB — CBC
HGB: 12.7 g/dL (ref 12.0–16.0)
MCH: 30.1 pg (ref 26.0–34.0)
RBC: 4.21 10*6/uL (ref 3.80–5.20)
RDW: 14.8 % — ABNORMAL HIGH (ref 11.5–14.5)

## 2012-05-03 LAB — TROPONIN I: Troponin-I: <0.02 ng/mL

## 2012-05-04 LAB — COMPREHENSIVE METABOLIC PANEL
Albumin: 3.6 g/dL (ref 3.4–5.0)
Alkaline Phosphatase: 144 U/L — ABNORMAL HIGH (ref 50–136)
Bilirubin,Total: 0.5 mg/dL (ref 0.2–1.0)
Calcium, Total: 9.2 mg/dL (ref 8.5–10.1)
Co2: 28 mmol/L (ref 21–32)
Creatinine: 1.49 mg/dL — ABNORMAL HIGH (ref 0.60–1.30)
Glucose: 223 mg/dL — ABNORMAL HIGH (ref 65–99)
Osmolality: 284 (ref 275–301)
Potassium: 4.3 mmol/L (ref 3.5–5.1)
SGPT (ALT): 17 U/L (ref 12–78)
Sodium: 136 mmol/L (ref 136–145)
Total Protein: 8 g/dL (ref 6.4–8.2)

## 2012-05-04 LAB — CBC WITH DIFFERENTIAL/PLATELET
Basophil %: 0.4 %
Eosinophil #: 0 10*3/uL (ref 0.0–0.7)
Eosinophil %: 0 %
HGB: 13.2 g/dL (ref 12.0–16.0)
Lymphocyte #: 1.1 10*3/uL (ref 1.0–3.6)
MCH: 31.8 pg (ref 26.0–34.0)
MCHC: 33.5 g/dL (ref 32.0–36.0)
MCV: 95 fL (ref 80–100)
Monocyte #: 1.2 x10 3/mm — ABNORMAL HIGH (ref 0.2–0.9)
Monocyte %: 6 %
Neutrophil #: 17.2 10*3/uL — ABNORMAL HIGH (ref 1.4–6.5)
Neutrophil %: 87.8 %
RBC: 4.14 10*6/uL (ref 3.80–5.20)

## 2012-05-04 LAB — APTT: Activated PTT: 29.2 secs (ref 23.6–35.9)

## 2012-05-04 LAB — PROTIME-INR
INR: 1.4
Prothrombin Time: 17.8 secs — ABNORMAL HIGH (ref 11.5–14.7)

## 2012-05-04 LAB — AMYLASE: Amylase: 53 U/L (ref 25–115)

## 2012-05-06 LAB — CBC WITH DIFFERENTIAL/PLATELET
Basophil %: 0.2 %
Eosinophil #: 0 10*3/uL (ref 0.0–0.7)
Eosinophil %: 0.1 %
HCT: 28.8 % — ABNORMAL LOW (ref 35.0–47.0)
HGB: 9.8 g/dL — ABNORMAL LOW (ref 12.0–16.0)
Lymphocyte #: 1.2 10*3/uL (ref 1.0–3.6)
Lymphocyte %: 8.9 %
MCH: 32.6 pg (ref 26.0–34.0)
MCHC: 33.9 g/dL (ref 32.0–36.0)
MCV: 96 fL (ref 80–100)
Monocyte #: 0.8 x10 3/mm (ref 0.2–0.9)
Neutrophil #: 11.9 10*3/uL — ABNORMAL HIGH (ref 1.4–6.5)

## 2012-05-06 LAB — BASIC METABOLIC PANEL
Chloride: 104 mmol/L (ref 98–107)
Creatinine: 1.55 mg/dL — ABNORMAL HIGH (ref 0.60–1.30)
EGFR (African American): 39 — ABNORMAL LOW
Glucose: 146 mg/dL — ABNORMAL HIGH (ref 65–99)
Potassium: 4.5 mmol/L (ref 3.5–5.1)

## 2012-05-06 LAB — HEPATIC FUNCTION PANEL A (ARMC)
Albumin: 2.2 g/dL — ABNORMAL LOW (ref 3.4–5.0)
SGOT(AST): 27 U/L (ref 15–37)
Total Protein: 5.9 g/dL — ABNORMAL LOW (ref 6.4–8.2)

## 2012-05-06 LAB — PRO B NATRIURETIC PEPTIDE: B-Type Natriuretic Peptide: 6030 pg/mL — ABNORMAL HIGH (ref 0–125)

## 2012-05-06 LAB — PROTIME-INR: Prothrombin Time: 16.9 secs — ABNORMAL HIGH (ref 11.5–14.7)

## 2012-05-07 DIAGNOSIS — I509 Heart failure, unspecified: Secondary | ICD-10-CM

## 2012-05-07 LAB — CBC WITH DIFFERENTIAL/PLATELET
Basophil #: 0.1 10*3/uL (ref 0.0–0.1)
Eosinophil #: 0.1 10*3/uL (ref 0.0–0.7)
Eosinophil %: 0.5 %
HGB: 9.4 g/dL — ABNORMAL LOW (ref 12.0–16.0)
Lymphocyte %: 11.3 %
MCH: 30.9 pg (ref 26.0–34.0)
MCHC: 32.3 g/dL (ref 32.0–36.0)
Monocyte #: 1 x10 3/mm — ABNORMAL HIGH (ref 0.2–0.9)
Neutrophil %: 79.9 %
Platelet: 219 10*3/uL (ref 150–440)
RBC: 3.04 10*6/uL — ABNORMAL LOW (ref 3.80–5.20)
WBC: 13 10*3/uL — ABNORMAL HIGH (ref 3.6–11.0)

## 2012-05-07 LAB — BASIC METABOLIC PANEL
BUN: 26 mg/dL — ABNORMAL HIGH (ref 7–18)
Chloride: 101 mmol/L (ref 98–107)
EGFR (African American): 39 — ABNORMAL LOW
EGFR (Non-African Amer.): 34 — ABNORMAL LOW
Glucose: 162 mg/dL — ABNORMAL HIGH (ref 65–99)
Potassium: 4.2 mmol/L (ref 3.5–5.1)
Sodium: 133 mmol/L — ABNORMAL LOW (ref 136–145)

## 2012-05-07 LAB — PATHOLOGY REPORT

## 2012-05-07 LAB — PROTIME-INR: INR: 1.3

## 2012-05-08 LAB — PROTIME-INR: Prothrombin Time: 16 secs — ABNORMAL HIGH (ref 11.5–14.7)

## 2012-05-08 LAB — CBC WITH DIFFERENTIAL/PLATELET
Basophil #: 0 10*3/uL (ref 0.0–0.1)
Basophil %: 0.2 %
Eosinophil #: 0.2 10*3/uL (ref 0.0–0.7)
Lymphocyte %: 8.3 %
MCH: 32.8 pg (ref 26.0–34.0)
MCHC: 34.3 g/dL (ref 32.0–36.0)
MCV: 96 fL (ref 80–100)
Monocyte %: 9.4 %
Neutrophil #: 10.4 10*3/uL — ABNORMAL HIGH (ref 1.4–6.5)
Platelet: 233 10*3/uL (ref 150–440)
RBC: 2.94 10*6/uL — ABNORMAL LOW (ref 3.80–5.20)

## 2012-05-08 LAB — BASIC METABOLIC PANEL
Anion Gap: 8 (ref 7–16)
BUN: 22 mg/dL — ABNORMAL HIGH (ref 7–18)
Chloride: 100 mmol/L (ref 98–107)
Co2: 26 mmol/L (ref 21–32)
Creatinine: 1.33 mg/dL — ABNORMAL HIGH (ref 0.60–1.30)
EGFR (African American): 47 — ABNORMAL LOW
EGFR (Non-African Amer.): 41 — ABNORMAL LOW
Glucose: 158 mg/dL — ABNORMAL HIGH (ref 65–99)
Sodium: 134 mmol/L — ABNORMAL LOW (ref 136–145)

## 2012-05-09 LAB — PROTIME-INR
INR: 1.3
Prothrombin Time: 16.1 secs — ABNORMAL HIGH (ref 11.5–14.7)

## 2012-05-09 LAB — CULTURE, BLOOD (SINGLE)

## 2012-05-10 LAB — CBC WITH DIFFERENTIAL/PLATELET
Eosinophil %: 1.3 %
HCT: 28.6 % — ABNORMAL LOW (ref 35.0–47.0)
MCV: 96 fL (ref 80–100)
Monocyte #: 1.1 x10 3/mm — ABNORMAL HIGH (ref 0.2–0.9)
Monocyte %: 7.6 %
Neutrophil %: 80.5 %
Platelet: 288 10*3/uL (ref 150–440)
RBC: 2.98 10*6/uL — ABNORMAL LOW (ref 3.80–5.20)
RDW: 14.6 % — ABNORMAL HIGH (ref 11.5–14.5)
WBC: 14.6 10*3/uL — ABNORMAL HIGH (ref 3.6–11.0)

## 2012-05-10 LAB — BASIC METABOLIC PANEL
Anion Gap: 8 (ref 7–16)
BUN: 24 mg/dL — ABNORMAL HIGH (ref 7–18)
Co2: 27 mmol/L (ref 21–32)
Creatinine: 1.31 mg/dL — ABNORMAL HIGH (ref 0.60–1.30)
EGFR (African American): 48 — ABNORMAL LOW
Potassium: 3.9 mmol/L (ref 3.5–5.1)

## 2012-05-11 LAB — PROTIME-INR
INR: 1.3
Prothrombin Time: 16.4 s — ABNORMAL HIGH

## 2012-05-21 ENCOUNTER — Ambulatory Visit (INDEPENDENT_AMBULATORY_CARE_PROVIDER_SITE_OTHER): Payer: Medicare Other

## 2012-05-21 DIAGNOSIS — I2692 Saddle embolus of pulmonary artery without acute cor pulmonale: Secondary | ICD-10-CM

## 2012-05-21 DIAGNOSIS — I2510 Atherosclerotic heart disease of native coronary artery without angina pectoris: Secondary | ICD-10-CM

## 2012-06-17 ENCOUNTER — Encounter (INDEPENDENT_AMBULATORY_CARE_PROVIDER_SITE_OTHER): Payer: Medicare Other

## 2012-06-17 DIAGNOSIS — I6529 Occlusion and stenosis of unspecified carotid artery: Secondary | ICD-10-CM

## 2012-06-18 ENCOUNTER — Ambulatory Visit (INDEPENDENT_AMBULATORY_CARE_PROVIDER_SITE_OTHER): Payer: Medicare Other

## 2012-06-18 DIAGNOSIS — I2510 Atherosclerotic heart disease of native coronary artery without angina pectoris: Secondary | ICD-10-CM

## 2012-06-18 DIAGNOSIS — I2692 Saddle embolus of pulmonary artery without acute cor pulmonale: Secondary | ICD-10-CM

## 2012-06-18 LAB — POCT INR: INR: 2.8

## 2012-07-23 ENCOUNTER — Ambulatory Visit (INDEPENDENT_AMBULATORY_CARE_PROVIDER_SITE_OTHER): Payer: Medicare Other

## 2012-07-23 DIAGNOSIS — I2510 Atherosclerotic heart disease of native coronary artery without angina pectoris: Secondary | ICD-10-CM

## 2012-07-23 DIAGNOSIS — I2692 Saddle embolus of pulmonary artery without acute cor pulmonale: Secondary | ICD-10-CM

## 2012-07-23 LAB — POCT INR: INR: 2.2

## 2012-09-03 ENCOUNTER — Ambulatory Visit (INDEPENDENT_AMBULATORY_CARE_PROVIDER_SITE_OTHER): Payer: Medicare Other

## 2012-09-03 DIAGNOSIS — I2692 Saddle embolus of pulmonary artery without acute cor pulmonale: Secondary | ICD-10-CM

## 2012-09-03 DIAGNOSIS — I2510 Atherosclerotic heart disease of native coronary artery without angina pectoris: Secondary | ICD-10-CM

## 2012-09-03 LAB — POCT INR: INR: 1.9

## 2012-09-04 ENCOUNTER — Other Ambulatory Visit: Payer: Self-pay | Admitting: *Deleted

## 2012-09-04 MED ORDER — WARFARIN SODIUM 2 MG PO TABS: ORAL_TABLET | ORAL | Status: DC

## 2012-09-30 ENCOUNTER — Encounter: Payer: Self-pay | Admitting: Cardiovascular Disease

## 2012-09-30 ENCOUNTER — Ambulatory Visit (INDEPENDENT_AMBULATORY_CARE_PROVIDER_SITE_OTHER): Payer: Medicare Other | Admitting: *Deleted

## 2012-09-30 ENCOUNTER — Ambulatory Visit (INDEPENDENT_AMBULATORY_CARE_PROVIDER_SITE_OTHER): Payer: Medicare Other | Admitting: Cardiovascular Disease

## 2012-09-30 VITALS — BP 122/80 | HR 88 | Ht 66.0 in | Wt 186.0 lb

## 2012-09-30 DIAGNOSIS — I2782 Chronic pulmonary embolism: Secondary | ICD-10-CM

## 2012-09-30 DIAGNOSIS — I2510 Atherosclerotic heart disease of native coronary artery without angina pectoris: Secondary | ICD-10-CM

## 2012-09-30 DIAGNOSIS — I2692 Saddle embolus of pulmonary artery without acute cor pulmonale: Secondary | ICD-10-CM

## 2012-09-30 DIAGNOSIS — I779 Disorder of arteries and arterioles, unspecified: Secondary | ICD-10-CM

## 2012-09-30 LAB — POCT INR: INR: 1.4

## 2012-09-30 NOTE — Patient Instructions (Addendum)
Your physician wants you to follow-up in:  12 months.  You will receive a reminder letter in the mail two months in advance. If you don't receive a letter, please call our office to schedule the follow-up appointment.  Your physician has requested that you have a carotid duplex. This test is an ultrasound of the carotid arteries in your neck. It looks at blood flow through these arteries that supply the brain with blood. Allow one hour for this exam. There are no restrictions or special instructions. To be done in July 2014

## 2012-09-30 NOTE — Progress Notes (Signed)
History of Present Illness: [name] is a 72 y.o. female who presents for cardiac follow up. She was admitted 12/28-1/22/13 with symptoms of pancreatitis. Diagnosed with cholelithiasis and underwent ERCP with sphincterotomy and stone extraction. Following this, she developed pulmonary edema and ruled in for an NSTEMI. Stress testing was undertaken and demonstrated an EF 20% and possible situs inversus which limited interpretation of the study. LHC was performed 06/01/11. This confirmed dextrocardia. 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 EF was 40-45% by cardiac catheterization. Echocardiogram 06/05/11: EF 40-45%, anteroseptal hypokinesis, apical hypokinesis, mild LAE. CT scan was recommended and performed 06/12/11. This demonstrated situs inversus totalis. What would typically be called RCA arose from anterior sinus of Valsalva. What would typically be called the left main arises from the posterior sinus of Valsalva and gives rise to a large first diagonal branch and diminutive circumflex. She was then seen by Dr. Laneta Simmers of [facility]. CABG was recommended. Gen. Surgery felt that cholecystectomy could be done in the future as cardiac risk outweighed further intervention for her gallbladder. CABG was performed 06/14/11: RIMA-LAD, SVG-ramus, SVG-RCA. Pre-CABG Dopplers 06/12/11: LICA 60-79%. I saw her in March 2013 and she was c/o dyspnea. I discussed a CTA chest to exclude PE and a CXR but she refused. She was admitted March 10,2013 to [facility] with right-sided pleuritic chest pain and dyspnea. She underwent CT scanning which showed a large saddle embolus. She was started on a heparin drip and admitted to the ICU for further evaluation. BNP and troponin were negative in ED. Echocardiogram showed right heart strain and a possible mass in the left atrium as well as moderate sized pericardial effusion, LVEF 50-55%.  She did not meet criteria for thrombolysis. Bilateral lower extremity dopplers were positive for subacute DVT noted throughout the right lower extremity, including the posterior tibial, popliteal, femoral and common femoral veins. There was acute, occlusive DVT noted in the posterior tibial vein of the left lower extremity. She was started on heparin and coumadin. She continued to have high O2 requirements and decision was made to place IVC filter. Hospital course was complicated by volume overload in setting of RV strain secondary to PE / diastolic failure. She was aggressively diuresed to achieve negative balance. 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 Repeat carotid dopplers 06/17/12 with 0-39% RICA stenosis, stable 60-79% LICA stenosis. Repeat echo 04/22/12 with normal LV size and function with mild valve disease and no pericardial effusion. She had her cholecystectomy in December 2013.   She is here today for cardiac follow up. Feeling well. No chest pain or SOB. Some swelling in right leg that resolves at night. She is having no bleeding problems on coumadin.   Primary Care Physician: Elias Else  Last Lipid Profile:Lipid Panel Most recent in Dr. Benjaman Pott office January 2014, controlled per pt.      Component Value Date/Time   CHOL 124 06/14/2011 0400   TRIG 91 06/14/2011 0400   HDL 36* 06/14/2011 0400   CHOLHDL 3.4 06/14/2011 0400   VLDL 18 06/14/2011 0400   LDLCALC 70 06/14/2011 0400     Past Medical History  Diagnosis Date  . Arthritis   . Neuropathy   . GERD (gastroesophageal reflux disease)   . Hypertension   . HLD (hyperlipidemia)   . Situs inversus with  dextrocardia     CT 06/2011: situs inversus totalis.  What would typically be called RCA arose from anterior sinus of Valsalva.  What would typically be called the left main arises from the posterior sinus of Valsalva and gives rise to a large  first diagonal branch and diminutive circumflex  . Dextrocardia   . Fracture 05/24/2011    right; "did not have surgery"  . DVT of leg (deep venous thrombosis) ~2006    left  . Pneumonia 06/13/11    "couple times; long time ago"  . CAD (coronary artery disease)     NSTEMI in setting of gallstone pancreatitis 05/2011:  LHC with 3v CAD; CABG was performed 06/14/11: RIMA-LAD, SVG-ramus, SVG-RCA  . Ischemic cardiomyopathy     Echocardiogram 06/05/11: EF 40-45%, anteroseptal hypokinesis, apical hypokinesis, mild LAE  . Chronic bronchitis   . Anemia   . H/O hiatal hernia   . Chronic systolic heart failure   . Carotid stenosis     Dopplers 06/12/11: LICA 60-79%.  . Pulmonary embolus March 2013    Past Surgical History  Procedure Laterality Date  . Ercp  06/03/2011    Procedure: ENDOSCOPIC RETROGRADE CHOLANGIOPANCREATOGRAPHY (ERCP);  Surgeon: Petra Kuba, MD;  Location: [facility] OR;  Service: Endoscopy;  Laterality: N/A;  . Vaginal hysterectomy  1970's  . Cardiac catheterization  06/11/11  . Coronary artery bypass graft  06/14/2011    Procedure: CORONARY ARTERY BYPASS GRAFTING (CABG);  Surgeon: Alleen Borne, MD;  Location: [facility] OR;  Service: Open Heart Surgery;  Laterality: N/A;    Current Outpatient Prescriptions  Medication Sig Dispense Refill  . allopurinol (ZYLOPRIM) 100 MG tablet Take 200 mg by mouth daily.        Marland Kitchen aspirin 325 MG tablet Take 325 mg by mouth daily.        Marland Kitchen BOOSTRIX 5-2.5-18.5 injection       . FOLIC ACID PO Take 10 mg by mouth daily.      . furosemide (LASIX) 40 MG tablet Take 40 mg by mouth daily.      Marland Kitchen gabapentin (NEURONTIN) 300 MG capsule Take 300 mg by mouth 2 (two) times daily.        . Magnesium 500 MG CAPS Take 1 capsule by mouth daily. PRN      . methotrexate (RHEUMATREX) 10 MG tablet Take 10 mg by mouth once a week. Caution: Chemotherapy. Protect from light.      Marland Kitchen omeprazole (PRILOSEC) 20 MG capsule Take 20 mg by mouth every morning.        . simvastatin (ZOCOR)  40 MG tablet Take 20 mg by mouth at bedtime.      Marland Kitchen warfarin (COUMADIN) 2 MG tablet Take as directed by coumadin clinic  180 tablet  0  . ZOSTAVAX 16109 UNT/0.65ML injection       . metoprolol (LOPRESSOR) 50 MG tablet Take 50 mg by mouth 2 (two) times daily.       No current facility-administered medications for this visit.    Allergies  Allergen Reactions  . Penicillins Anaphylaxis  . Shellfish Allergy     History   Social History  . Marital Status: Married    Spouse Name: N/A    Number of Children: N/A  . Years of Education: N/A   Occupational History  . Not on file.   Social History Main Topics  . Smoking status: Never Smoker   . Smokeless tobacco: Never Used  . Alcohol Use: No  . Drug  Use: No  . Sexually Active: No   Other Topics Concern  . Not on file   Social History Narrative  . No narrative on file    No family history on file.  Review of Systems:  As stated in the HPI and otherwise negative.   BP 122/80  Pulse 88  Ht 5\' 6"  (1.676 m)  Wt 186 lb (84.369 kg)  BMI 30.04 kg/m2  Physical Examination: General: Well developed, well nourished, NAD HEENT: OP clear, mucus membranes moist SKIN: warm, dry. No rashes. Neuro: No focal deficits Musculoskeletal: Muscle strength 5/5 all ext Psychiatric: Mood and affect normal Neck: No JVD, no carotid bruits, no thyromegaly, no lymphadenopathy. Lungs:Clear bilaterally, no wheezes, rhonci, crackles Cardiovascular: Regular rate and rhythm. No murmurs, gallops or rubs. Abdomen:Soft. Bowel sounds present. Non-tender.  Extremities: No lower extremity edema. Pulses are 2 + in the bilateral DP/PT.  EKG: NSR, rate 88 bpm. Q waves inferior leads and septal leads.   Carotid artery dopplers: 06/17/12: 0-39% RICA stenosis, 60-79% LICA stenosis.   Echo 04/22/12: Left ventricle: The cavity size was normal. Wall thickness was normal. Systolic function was normal. The estimated ejection fraction was in the range of 55% to  60%. Wall motion was normal; there were no regional wall motion abnormalities. Doppler parameters are consistent with abnormal left ventricular relaxation (grade 1 diastolic dysfunction). - Aortic valve: There was no stenosis. - Mitral valve: Mild regurgitation. - Right ventricle: The cavity size was normal. Systolic function was normal. - Tricuspid valve: Peak RV-RA gradient: 21mm Hg (S). - Pulmonary arteries: PA peak pressure: 26mm Hg (S). - Inferior vena cava: The vessel was normal in size; the respirophasic diameter changes were in the normal range (= 50%); findings are consistent with normal central venous pressure. - Pericardium, extracardiac: There was no pericardial effusion. Impressions:  - Normal LV size and systolic function, EF 55-60%. Normal RV size and systolic function. Mild mitral regurgitation.  Assessment and Plan:   1. Saddle embolus of pulmonary artery/CVT: She is on coumadin therapy and will need this for lifetime.   2. Carotid stenosis: Stable. Repeat July 2014.   3. CAD: Stable post bypass. Continue ASA, statin and beta blocker.   4. 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 Continue coumadin.   5. Pericardial effusion: Moderate sized pericardial effusion post CABG, resolved on echo 04/22/12.

## 2012-10-08 ENCOUNTER — Ambulatory Visit (INDEPENDENT_AMBULATORY_CARE_PROVIDER_SITE_OTHER): Payer: Medicare Other

## 2012-10-08 DIAGNOSIS — I2510 Atherosclerotic heart disease of native coronary artery without angina pectoris: Secondary | ICD-10-CM

## 2012-10-08 DIAGNOSIS — I2692 Saddle embolus of pulmonary artery without acute cor pulmonale: Secondary | ICD-10-CM

## 2012-10-22 ENCOUNTER — Ambulatory Visit (INDEPENDENT_AMBULATORY_CARE_PROVIDER_SITE_OTHER): Payer: Medicare Other

## 2012-10-22 DIAGNOSIS — I2510 Atherosclerotic heart disease of native coronary artery without angina pectoris: Secondary | ICD-10-CM

## 2012-10-22 DIAGNOSIS — I2692 Saddle embolus of pulmonary artery without acute cor pulmonale: Secondary | ICD-10-CM

## 2012-11-05 ENCOUNTER — Ambulatory Visit (INDEPENDENT_AMBULATORY_CARE_PROVIDER_SITE_OTHER): Payer: Medicare Other

## 2012-11-05 DIAGNOSIS — I2692 Saddle embolus of pulmonary artery without acute cor pulmonale: Secondary | ICD-10-CM

## 2012-11-05 DIAGNOSIS — I2510 Atherosclerotic heart disease of native coronary artery without angina pectoris: Secondary | ICD-10-CM

## 2012-11-26 ENCOUNTER — Ambulatory Visit (INDEPENDENT_AMBULATORY_CARE_PROVIDER_SITE_OTHER): Payer: Medicare Other

## 2012-11-26 DIAGNOSIS — I2510 Atherosclerotic heart disease of native coronary artery without angina pectoris: Secondary | ICD-10-CM

## 2012-11-26 DIAGNOSIS — I2692 Saddle embolus of pulmonary artery without acute cor pulmonale: Secondary | ICD-10-CM

## 2012-11-26 LAB — POCT INR: INR: 2

## 2012-11-26 MED ORDER — WARFARIN SODIUM 2 MG PO TABS: ORAL_TABLET | ORAL | Status: DC

## 2012-12-15 ENCOUNTER — Encounter (INDEPENDENT_AMBULATORY_CARE_PROVIDER_SITE_OTHER): Payer: Medicare Other

## 2012-12-15 ENCOUNTER — Ambulatory Visit (INDEPENDENT_AMBULATORY_CARE_PROVIDER_SITE_OTHER): Payer: Medicare Other

## 2012-12-15 DIAGNOSIS — I6529 Occlusion and stenosis of unspecified carotid artery: Secondary | ICD-10-CM

## 2012-12-15 DIAGNOSIS — I2692 Saddle embolus of pulmonary artery without acute cor pulmonale: Secondary | ICD-10-CM

## 2012-12-15 DIAGNOSIS — I2510 Atherosclerotic heart disease of native coronary artery without angina pectoris: Secondary | ICD-10-CM

## 2012-12-31 ENCOUNTER — Ambulatory Visit (INDEPENDENT_AMBULATORY_CARE_PROVIDER_SITE_OTHER): Payer: Medicare Other | Admitting: *Deleted

## 2012-12-31 DIAGNOSIS — I2692 Saddle embolus of pulmonary artery without acute cor pulmonale: Secondary | ICD-10-CM

## 2012-12-31 DIAGNOSIS — I2510 Atherosclerotic heart disease of native coronary artery without angina pectoris: Secondary | ICD-10-CM

## 2013-01-28 ENCOUNTER — Ambulatory Visit (INDEPENDENT_AMBULATORY_CARE_PROVIDER_SITE_OTHER): Payer: Medicare Other

## 2013-01-28 DIAGNOSIS — I2692 Saddle embolus of pulmonary artery without acute cor pulmonale: Secondary | ICD-10-CM

## 2013-01-28 DIAGNOSIS — I2510 Atherosclerotic heart disease of native coronary artery without angina pectoris: Secondary | ICD-10-CM

## 2013-02-25 ENCOUNTER — Ambulatory Visit (INDEPENDENT_AMBULATORY_CARE_PROVIDER_SITE_OTHER): Payer: Medicare Other

## 2013-02-25 ENCOUNTER — Other Ambulatory Visit: Payer: Self-pay

## 2013-02-25 DIAGNOSIS — I2692 Saddle embolus of pulmonary artery without acute cor pulmonale: Secondary | ICD-10-CM

## 2013-02-25 DIAGNOSIS — I2510 Atherosclerotic heart disease of native coronary artery without angina pectoris: Secondary | ICD-10-CM

## 2013-02-25 LAB — POCT INR
INR: 5.1
INR: >8

## 2013-02-25 LAB — PROTIME-INR
INR: 5.1
Prothrombin Time: 44.8 secs — ABNORMAL HIGH (ref 11.5–14.7)

## 2013-02-26 ENCOUNTER — Other Ambulatory Visit (INDEPENDENT_AMBULATORY_CARE_PROVIDER_SITE_OTHER): Payer: Medicare Other

## 2013-02-26 DIAGNOSIS — I2692 Saddle embolus of pulmonary artery without acute cor pulmonale: Secondary | ICD-10-CM

## 2013-02-26 DIAGNOSIS — I2510 Atherosclerotic heart disease of native coronary artery without angina pectoris: Secondary | ICD-10-CM

## 2013-03-04 ENCOUNTER — Ambulatory Visit (INDEPENDENT_AMBULATORY_CARE_PROVIDER_SITE_OTHER): Payer: Medicare Other | Admitting: General Practice

## 2013-03-04 DIAGNOSIS — I2510 Atherosclerotic heart disease of native coronary artery without angina pectoris: Secondary | ICD-10-CM

## 2013-03-04 DIAGNOSIS — I2692 Saddle embolus of pulmonary artery without acute cor pulmonale: Secondary | ICD-10-CM

## 2013-03-04 LAB — POCT INR: INR: 2

## 2013-03-25 ENCOUNTER — Ambulatory Visit (INDEPENDENT_AMBULATORY_CARE_PROVIDER_SITE_OTHER): Payer: Medicare Other | Admitting: General Practice

## 2013-03-25 ENCOUNTER — Other Ambulatory Visit: Payer: Self-pay | Admitting: Family Medicine

## 2013-03-25 DIAGNOSIS — I2510 Atherosclerotic heart disease of native coronary artery without angina pectoris: Secondary | ICD-10-CM

## 2013-03-25 DIAGNOSIS — I2692 Saddle embolus of pulmonary artery without acute cor pulmonale: Secondary | ICD-10-CM

## 2013-03-25 DIAGNOSIS — Z1231 Encounter for screening mammogram for malignant neoplasm of breast: Secondary | ICD-10-CM

## 2013-03-25 LAB — POCT INR: INR: 2.9

## 2013-04-22 ENCOUNTER — Ambulatory Visit (INDEPENDENT_AMBULATORY_CARE_PROVIDER_SITE_OTHER): Payer: Medicare Other | Admitting: General Practice

## 2013-04-22 DIAGNOSIS — I2692 Saddle embolus of pulmonary artery without acute cor pulmonale: Secondary | ICD-10-CM

## 2013-04-22 DIAGNOSIS — I2510 Atherosclerotic heart disease of native coronary artery without angina pectoris: Secondary | ICD-10-CM

## 2013-04-27 ENCOUNTER — Ambulatory Visit
Admission: RE | Admit: 2013-04-27 | Discharge: 2013-04-27 | Disposition: A | Discharge status: Home / Self Care | Payer: Commercial Managed Care - HMO | Source: Ambulatory Visit | Attending: Family Medicine | Admitting: Family Medicine

## 2013-04-27 DIAGNOSIS — Z1231 Encounter for screening mammogram for malignant neoplasm of breast: Secondary | ICD-10-CM

## 2013-05-06 ENCOUNTER — Ambulatory Visit (INDEPENDENT_AMBULATORY_CARE_PROVIDER_SITE_OTHER): Payer: Medicare Other | Admitting: General Practice

## 2013-05-06 DIAGNOSIS — I2692 Saddle embolus of pulmonary artery without acute cor pulmonale: Secondary | ICD-10-CM

## 2013-05-06 DIAGNOSIS — I2510 Atherosclerotic heart disease of native coronary artery without angina pectoris: Secondary | ICD-10-CM

## 2013-05-06 LAB — POCT INR: INR: 3.5

## 2013-05-06 MED ORDER — WARFARIN SODIUM 2 MG PO TABS: ORAL_TABLET | ORAL | Status: DC

## 2013-05-20 ENCOUNTER — Ambulatory Visit (INDEPENDENT_AMBULATORY_CARE_PROVIDER_SITE_OTHER): Payer: Medicare Other

## 2013-05-20 DIAGNOSIS — I2510 Atherosclerotic heart disease of native coronary artery without angina pectoris: Secondary | ICD-10-CM

## 2013-05-20 DIAGNOSIS — I2692 Saddle embolus of pulmonary artery without acute cor pulmonale: Secondary | ICD-10-CM

## 2013-05-20 LAB — POCT INR: INR: 2.2

## 2013-05-25 ENCOUNTER — Telehealth: Payer: Self-pay | Admitting: *Deleted

## 2013-05-25 ENCOUNTER — Inpatient Hospital Stay: Payer: Self-pay | Admitting: Internal Medicine

## 2013-05-25 LAB — URINALYSIS, COMPLETE
Bilirubin,UR: NEGATIVE
Ph: 5 (ref 4.5–8.0)
RBC,UR: 5 /HPF (ref 0–5)
Squamous Epithelial: <1
WBC UR: <1 /HPF (ref 0–5)

## 2013-05-25 LAB — PROTIME-INR
INR: 1.8
Prothrombin Time: 20.9 secs — ABNORMAL HIGH (ref 11.5–14.7)

## 2013-05-25 LAB — COMPREHENSIVE METABOLIC PANEL
Albumin: 3.8 g/dL (ref 3.4–5.0)
BUN: 33 mg/dL — ABNORMAL HIGH (ref 7–18)
Chloride: 104 mmol/L (ref 98–107)
EGFR (African American): 41 — ABNORMAL LOW
EGFR (Non-African Amer.): 36 — ABNORMAL LOW
Glucose: 114 mg/dL — ABNORMAL HIGH (ref 65–99)
Osmolality: 282 (ref 275–301)
SGOT(AST): 242 U/L — ABNORMAL HIGH (ref 15–37)
SGPT (ALT): 193 U/L — ABNORMAL HIGH (ref 12–78)

## 2013-05-25 LAB — CBC
HCT: 43.4 % (ref 35.0–47.0)
HGB: 13.7 g/dL (ref 12.0–16.0)
MCHC: 31.6 g/dL — ABNORMAL LOW (ref 32.0–36.0)
MCV: 98 fL (ref 80–100)
Platelet: 271 10*3/uL (ref 150–440)
WBC: 20 10*3/uL — ABNORMAL HIGH (ref 3.6–11.0)

## 2013-05-25 LAB — DIFFERENTIAL
Eosinophil: 1 %
Monocytes: 5 %
Segmented Neutrophils: 77 %
Variant Lymphocyte - H1-Rlymph: 1 %

## 2013-05-25 LAB — CK TOTAL AND CKMB (NOT AT ARMC): CK-MB: <0.5 ng/mL — ABNORMAL LOW (ref 0.5–3.6)

## 2013-05-25 LAB — TROPONIN I: Troponin-I: <0.02 ng/mL

## 2013-05-25 LAB — APTT: Activated PTT: 28.8 secs (ref 23.6–35.9)

## 2013-05-25 NOTE — Telephone Encounter (Signed)
Spoke with pt's husband. Pt is currently still in ED and he thinks she will be admitted. He states she was going to be discharged but then got worse so he thinks they will keep her in the hospital.  I have asked him to contact our office for appt if she is discharged.

## 2013-05-25 NOTE — Telephone Encounter (Signed)
Received message from Dr. Kirke Corin that pt was seen in [facility] ED for atypical chest pain and will need a close follow up visit. I placed call to pt to schedule this appointment for May 27, 2013 at 11:00 with Dr. Clifton James. No answer on home phone and no voicemail. Mailbox full on mobile number.

## 2013-05-26 LAB — CBC WITH DIFFERENTIAL/PLATELET
HCT: 36 % (ref 35.0–47.0)
Lymphocyte %: 5.4 %
MCH: 31.6 pg (ref 26.0–34.0)
MCHC: 32.1 g/dL (ref 32.0–36.0)
Monocyte #: 0.1 x10 3/mm — ABNORMAL LOW (ref 0.2–0.9)
Monocyte %: 0.7 %
Neutrophil #: 15.7 10*3/uL — ABNORMAL HIGH (ref 1.4–6.5)
Platelet: 184 10*3/uL (ref 150–440)
RDW: 16.9 % — ABNORMAL HIGH (ref 11.5–14.5)

## 2013-05-26 LAB — COMPREHENSIVE METABOLIC PANEL
Albumin: 3 g/dL — ABNORMAL LOW (ref 3.4–5.0)
BUN: 29 mg/dL — ABNORMAL HIGH (ref 7–18)
Calcium, Total: 8.6 mg/dL (ref 8.5–10.1)
Chloride: 101 mmol/L (ref 98–107)
Co2: 30 mmol/L (ref 21–32)
Creatinine: 1.6 mg/dL — ABNORMAL HIGH (ref 0.60–1.30)
EGFR (African American): 37 — ABNORMAL LOW
EGFR (Non-African Amer.): 32 — ABNORMAL LOW
Osmolality: 279 (ref 275–301)
Potassium: 4.4 mmol/L (ref 3.5–5.1)
SGOT(AST): 115 U/L — ABNORMAL HIGH (ref 15–37)
SGPT (ALT): 195 U/L — ABNORMAL HIGH (ref 12–78)
Sodium: 137 mmol/L (ref 136–145)
Total Protein: 6.8 g/dL (ref 6.4–8.2)

## 2013-05-26 NOTE — Telephone Encounter (Signed)
Spoke with [employer] and pt is currently in [facility]

## 2013-05-27 LAB — CBC WITH DIFFERENTIAL/PLATELET
Basophil %: 0.3 %
Eosinophil #: 0 10*3/uL (ref 0.0–0.7)
HGB: 10.9 g/dL — ABNORMAL LOW (ref 12.0–16.0)
Lymphocyte #: 1 10*3/uL (ref 1.0–3.6)
MCH: 32.8 pg (ref 26.0–34.0)
MCV: 99 fL (ref 80–100)
Monocyte %: 5.3 %
Neutrophil #: 7 10*3/uL — ABNORMAL HIGH (ref 1.4–6.5)
RBC: 3.31 10*6/uL — ABNORMAL LOW (ref 3.80–5.20)

## 2013-05-27 LAB — COMPREHENSIVE METABOLIC PANEL
Albumin: 2.3 g/dL — ABNORMAL LOW (ref 3.4–5.0)
Bilirubin,Total: 0.5 mg/dL (ref 0.2–1.0)
Calcium, Total: 8.6 mg/dL (ref 8.5–10.1)
Creatinine: 1.35 mg/dL — ABNORMAL HIGH (ref 0.60–1.30)
EGFR (African American): 46 — ABNORMAL LOW
EGFR (Non-African Amer.): 40 — ABNORMAL LOW
Potassium: 4.3 mmol/L (ref 3.5–5.1)
SGOT(AST): 46 U/L — ABNORMAL HIGH (ref 15–37)
SGPT (ALT): 111 U/L — ABNORMAL HIGH (ref 12–78)

## 2013-05-28 LAB — CULTURE, BLOOD (SINGLE)

## 2013-06-17 ENCOUNTER — Ambulatory Visit (INDEPENDENT_AMBULATORY_CARE_PROVIDER_SITE_OTHER): Payer: Medicare HMO

## 2013-06-17 DIAGNOSIS — I2510 Atherosclerotic heart disease of native coronary artery without angina pectoris: Secondary | ICD-10-CM

## 2013-06-17 DIAGNOSIS — I2692 Saddle embolus of pulmonary artery without acute cor pulmonale: Secondary | ICD-10-CM

## 2013-06-17 LAB — POCT INR: INR: 2.1

## 2013-06-17 MED ORDER — WARFARIN SODIUM 2 MG PO TABS: ORAL_TABLET | ORAL | Status: DC

## 2013-07-15 ENCOUNTER — Ambulatory Visit (INDEPENDENT_AMBULATORY_CARE_PROVIDER_SITE_OTHER): Payer: Commercial Managed Care - HMO | Admitting: Pharmacist

## 2013-07-15 DIAGNOSIS — I2510 Atherosclerotic heart disease of native coronary artery without angina pectoris: Secondary | ICD-10-CM

## 2013-07-15 DIAGNOSIS — I2692 Saddle embolus of pulmonary artery without acute cor pulmonale: Secondary | ICD-10-CM

## 2013-07-15 LAB — POCT INR: INR: 1.7

## 2013-08-05 ENCOUNTER — Ambulatory Visit (INDEPENDENT_AMBULATORY_CARE_PROVIDER_SITE_OTHER): Payer: Commercial Managed Care - HMO

## 2013-08-05 DIAGNOSIS — Z5181 Encounter for therapeutic drug level monitoring: Secondary | ICD-10-CM

## 2013-08-05 DIAGNOSIS — I2692 Saddle embolus of pulmonary artery without acute cor pulmonale: Secondary | ICD-10-CM

## 2013-08-05 DIAGNOSIS — I2510 Atherosclerotic heart disease of native coronary artery without angina pectoris: Secondary | ICD-10-CM

## 2013-08-05 LAB — POCT INR: INR: 1.8

## 2013-08-19 ENCOUNTER — Ambulatory Visit (INDEPENDENT_AMBULATORY_CARE_PROVIDER_SITE_OTHER): Payer: Commercial Managed Care - HMO

## 2013-08-19 DIAGNOSIS — Z5181 Encounter for therapeutic drug level monitoring: Secondary | ICD-10-CM

## 2013-08-19 DIAGNOSIS — I2692 Saddle embolus of pulmonary artery without acute cor pulmonale: Secondary | ICD-10-CM

## 2013-08-19 DIAGNOSIS — I2510 Atherosclerotic heart disease of native coronary artery without angina pectoris: Secondary | ICD-10-CM

## 2013-08-19 LAB — POCT INR: INR: 2.9

## 2013-09-09 ENCOUNTER — Ambulatory Visit (INDEPENDENT_AMBULATORY_CARE_PROVIDER_SITE_OTHER): Payer: Medicare HMO

## 2013-09-09 DIAGNOSIS — I2692 Saddle embolus of pulmonary artery without acute cor pulmonale: Secondary | ICD-10-CM

## 2013-09-09 DIAGNOSIS — I2510 Atherosclerotic heart disease of native coronary artery without angina pectoris: Secondary | ICD-10-CM

## 2013-09-09 DIAGNOSIS — Z5181 Encounter for therapeutic drug level monitoring: Secondary | ICD-10-CM

## 2013-09-09 LAB — POCT INR: INR: 2

## 2013-10-07 ENCOUNTER — Ambulatory Visit (INDEPENDENT_AMBULATORY_CARE_PROVIDER_SITE_OTHER): Payer: Commercial Managed Care - HMO

## 2013-10-07 DIAGNOSIS — I2510 Atherosclerotic heart disease of native coronary artery without angina pectoris: Secondary | ICD-10-CM | POA: Diagnosis not present

## 2013-10-07 DIAGNOSIS — I2692 Saddle embolus of pulmonary artery without acute cor pulmonale: Secondary | ICD-10-CM | POA: Diagnosis not present

## 2013-10-07 DIAGNOSIS — Z5181 Encounter for therapeutic drug level monitoring: Secondary | ICD-10-CM | POA: Diagnosis not present

## 2013-10-07 LAB — POCT INR: INR: 2.6

## 2013-10-07 MED ORDER — WARFARIN SODIUM 2 MG PO TABS: ORAL_TABLET | ORAL | Status: DC

## 2013-10-29 ENCOUNTER — Ambulatory Visit (INDEPENDENT_AMBULATORY_CARE_PROVIDER_SITE_OTHER): Payer: Commercial Managed Care - HMO | Admitting: Cardiovascular Disease

## 2013-10-29 ENCOUNTER — Encounter: Payer: Self-pay | Admitting: Cardiovascular Disease

## 2013-10-29 VITALS — BP 110/70 | HR 81 | Ht 66.0 in | Wt 186.0 lb

## 2013-10-29 DIAGNOSIS — I2692 Saddle embolus of pulmonary artery without acute cor pulmonale: Secondary | ICD-10-CM

## 2013-10-29 DIAGNOSIS — R222 Localized swelling, mass and lump, trunk: Secondary | ICD-10-CM

## 2013-10-29 DIAGNOSIS — I5189 Other ill-defined heart diseases: Secondary | ICD-10-CM

## 2013-10-29 DIAGNOSIS — I6529 Occlusion and stenosis of unspecified carotid artery: Secondary | ICD-10-CM | POA: Diagnosis not present

## 2013-10-29 DIAGNOSIS — I2510 Atherosclerotic heart disease of native coronary artery without angina pectoris: Secondary | ICD-10-CM

## 2013-10-29 NOTE — Patient Instructions (Addendum)
Your physician wants you to follow-up in:  12 months. You will receive a reminder letter in the mail two months in advance. If you don't receive a letter, please call our office to schedule the follow-up appointment.  Your physician has requested that you have a carotid duplex. This test is an ultrasound of the carotid arteries in your neck. It looks at blood flow through these arteries that supply the brain with blood. Allow one hour for this exam. There are no restrictions or special instructions. To be done in [place] office

## 2013-10-29 NOTE — Progress Notes (Signed)
History of Present Illness: 73 yo female with history of CAD s/p CABG, PE, carotid artery disease who is here today for cardiac follow up. She was admitted 12/28-1/22/13 with symptoms of pancreatitis. Diagnosed with cholelithiasis and underwent ERCP with sphincterotomy and stone extraction. Following this, she developed pulmonary edema and ruled in for an NSTEMI. Stress testing was undertaken and demonstrated an EF 20% and possible situs inversus which limited interpretation of the study. LHC was performed 06/01/11. This confirmed dextrocardia. 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 EF was 40-45% by cardiac catheterization. Echocardiogram 06/05/11: EF 40-45%, anteroseptal hypokinesis, apical hypokinesis, mild LAE. CT scan was recommended and performed 06/12/11. This demonstrated situs inversus totalis. What would typically be called RCA arose from anterior sinus of Valsalva. What would typically be called the left main arises from the posterior sinus of Valsalva and gives rise to a large first diagonal branch and diminutive circumflex. She was then seen by Dr. Laneta Simmers of [facility]. CABG was recommended. Gen. Surgery felt that cholecystectomy could be done in the future as cardiac risk outweighed further intervention for her gallbladder. CABG was performed 06/14/11: RIMA-LAD, SVG-ramus, SVG-RCA. Pre-CABG Dopplers 06/12/11: LICA 60-79%. I saw her in March 2013 and she was c/o dyspnea. I discussed a CTA chest to exclude PE and a CXR but she refused. She was admitted March 10,2013 to [facility] with right-sided pleuritic chest pain and dyspnea. She underwent CT scanning which showed a large saddle embolus. She was started on a heparin drip and admitted to the ICU for further evaluation. BNP and troponin were negative in ED. Echocardiogram showed right heart strain and a possible mass in the left atrium as well as moderate  sized pericardial effusion, LVEF 50-55%. She did not meet criteria for thrombolysis. Bilateral lower extremity dopplers were positive for subacute DVT noted throughout the right lower extremity, including the posterior tibial, popliteal, femoral and common femoral veins. There was acute, occlusive DVT noted in the posterior tibial vein of the left lower extremity. She was started on heparin and coumadin. She continued to have high O2 requirements and decision was made to place IVC filter. Hospital course was complicated by volume overload in setting of RV strain secondary to PE / diastolic failure. She was aggressively diuresed to achieve negative balance. 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 Repeat carotid dopplers 06/17/12 with 0-39% RICA stenosis, stable 60-79% LICA stenosis. Repeat echo 04/22/12 with normal LV size and function with mild valve disease and no pericardial effusion. She had her cholecystectomy in December 2013. Admitted to [facility] December 2014 with chest pain and she was treated for pneumonia.    She is here today for cardiac follow up. Feeling well. No chest pain or SOB. Some swelling in right leg that resolves at night. She is having no bleeding problems on coumadin.   Primary Care Physician: Elias Else  Last Lipid Profile:Lipid Panel Most recent in Dr. Benjaman Pott office January 2014, controlled per pt.   Past Medical History  Diagnosis Date  . Arthritis   . Neuropathy   . GERD (gastroesophageal reflux disease)   . Hypertension   . HLD (hyperlipidemia)   . Situs inversus with dextrocardia     CT 06/2011: situs inversus totalis.  What would typically be called RCA arose from anterior sinus of Valsalva.  What would typically be called the left main arises from the posterior sinus of Valsalva and gives rise to a large first diagonal branch and diminutive circumflex  . Dextrocardia   . Fracture  05/24/2011    right; "did not have surgery"  . DVT of leg (deep venous thrombosis) ~2006    left  . Pneumonia 06/13/11    "couple times; long time ago"  . CAD (coronary artery disease)     NSTEMI in setting of gallstone pancreatitis 05/2011:  LHC with 3v CAD; CABG was performed 06/14/11: RIMA-LAD, SVG-ramus, SVG-RCA  . Ischemic cardiomyopathy     Echocardiogram 06/05/11: EF 40-45%, anteroseptal hypokinesis, apical hypokinesis, mild LAE  . Chronic bronchitis   . Anemia   . H/O hiatal hernia   . Chronic systolic heart failure   . Carotid stenosis     Dopplers 06/12/11: LICA 60-79%.  . Pulmonary embolus March 2013    Past Surgical History  Procedure Laterality Date  . Ercp  06/03/2011    Procedure: ENDOSCOPIC RETROGRADE CHOLANGIOPANCREATOGRAPHY (ERCP);  Surgeon: Petra Kuba, MD;  Location: [facility] OR;  Service: Endoscopy;  Laterality: N/A;  . Vaginal hysterectomy  1970's  . Cardiac catheterization  06/11/11  . Coronary artery bypass graft  06/14/2011    Procedure: CORONARY ARTERY BYPASS GRAFTING (CABG);  Surgeon: Alleen Borne, MD;  Location: [facility] OR;  Service: Open Heart Surgery;  Laterality: N/A;    Current Outpatient Prescriptions  Medication Sig Dispense Refill  . alendronate (FOSAMAX) 70 MG tablet Take 70 mg by mouth once a week. Take with a full glass of water on an empty stomach.      Marland Kitchen allopurinol (ZYLOPRIM) 100 MG tablet Take 200 mg by mouth daily.        Marland Kitchen aspirin 325 MG tablet Take 325 mg by mouth daily.        Marland Kitchen BOOSTRIX 5-2.5-18.5 injection       . FOLIC ACID PO Take 10 mg by mouth daily.      . furosemide (LASIX) 40 MG tablet Take 40 mg by mouth daily.      Marland Kitchen gabapentin (NEURONTIN) 300 MG capsule Take 300 mg by mouth 2 (two) times daily.        . Loratadine 10 MG CAPS       . Magnesium 500 MG CAPS Take 1 capsule by mouth daily. PRN      . metFORMIN (GLUCOPHAGE-XR) 500 MG 24 hr tablet Takes twice a week      . methotrexate (RHEUMATREX) 10 MG tablet Take 10 mg by mouth once a week.  Caution: Chemotherapy. Protect from light.      Marland Kitchen omeprazole (PRILOSEC) 20 MG capsule Take 20 mg by mouth every morning.        . simvastatin (ZOCOR) 40 MG tablet Take 20 mg by mouth at bedtime.      . Vitamins A & D (VITAMIN A & D) 10000-400 UNITS CAPS       . warfarin (COUMADIN) 2 MG tablet Take as directed by coumadin clinic  90 tablet  3  . ZOSTAVAX 96759 UNT/0.65ML injection       . metoprolol (LOPRESSOR) 50 MG tablet Take 50 mg by mouth 2 (two) times daily.       No current facility-administered medications for this visit.    Allergies  Allergen Reactions  . Penicillins Anaphylaxis  . Shellfish Allergy     History   Social History  . Marital Status: Married  Spouse Name: N/A    Number of Children: N/A  . Years of Education: N/A   Occupational History  . Not on file.   Social History Main Topics  . Smoking status: Never Smoker   . Smokeless tobacco: Never Used  . Alcohol Use: No  . Drug Use: No  . Sexual Activity: No   Other Topics Concern  . Not on file   Social History Narrative  . No narrative on file    No family history on file.  Review of Systems:  As stated in the HPI and otherwise negative.   BP 110/70  Pulse 81  Ht 5\' 6"  (1.676 m)  Wt 186 lb (84.369 kg)  BMI 30.04 kg/m2  Physical Examination: General: Well developed, well nourished, NAD HEENT: OP clear, mucus membranes moist SKIN: warm, dry. No rashes. Neuro: No focal deficits Musculoskeletal: Muscle strength 5/5 all ext Psychiatric: Mood and affect normal Neck: No JVD, no carotid bruits, no thyromegaly, no lymphadenopathy. Lungs:Clear bilaterally, no wheezes, rhonci, crackles Cardiovascular: Regular rate and rhythm. No murmurs, gallops or rubs. Abdomen:Soft. Bowel sounds present. Non-tender.  Extremities: No lower extremity edema. Pulses are 2 + in the bilateral DP/PT.  EKG: NSR, rate 81 bpm. RBBB. Q waves inferior leads and septal leads.   Carotid artery dopplers: 06/17/12: 0-39%  RICA stenosis, 60-79% LICA stenosis.   Echo 04/22/12: Left ventricle: The cavity size was normal. Wall thickness was normal. Systolic function was normal. The estimated ejection fraction was in the range of 55% to 60%. Wall motion was normal; there were no regional wall motion abnormalities. Doppler parameters are consistent with abnormal left ventricular relaxation (grade 1 diastolic dysfunction). - Aortic valve: There was no stenosis. - Mitral valve: Mild regurgitation. - Right ventricle: The cavity size was normal. Systolic function was normal. - Tricuspid valve: Peak RV-RA gradient: 90mm Hg (S). - Pulmonary arteries: PA peak pressure: 69mm Hg (S). - Inferior vena cava: The vessel was normal in size; the respirophasic diameter changes were in the normal range (= 50%); findings are consistent with normal central venous pressure. - Pericardium, extracardiac: There was no pericardial effusion. Impressions:  - Normal LV size and systolic function, EF 55-60%. Normal RV size and systolic function. Mild mitral regurgitation.  Assessment and Plan:   1. Saddle embolus of pulmonary artery/CVT: She is on coumadin therapy and will need this for lifetime.   2. Carotid stenosis: Stable 2014. Repeat now. 60-79% LICA stenosis.   3. CAD: Stable post bypass. Continue ASA, statin and beta blocker. Lower ASA to 81 mg daily.   4. 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 Continue coumadin.   5. Pericardial effusion: Resolved on echo 04/22/12.

## 2013-11-10 ENCOUNTER — Encounter (INDEPENDENT_AMBULATORY_CARE_PROVIDER_SITE_OTHER): Payer: Medicare HMO

## 2013-11-10 ENCOUNTER — Ambulatory Visit (INDEPENDENT_AMBULATORY_CARE_PROVIDER_SITE_OTHER): Payer: Medicare HMO | Admitting: *Deleted

## 2013-11-10 DIAGNOSIS — I2510 Atherosclerotic heart disease of native coronary artery without angina pectoris: Secondary | ICD-10-CM

## 2013-11-10 DIAGNOSIS — I6529 Occlusion and stenosis of unspecified carotid artery: Secondary | ICD-10-CM

## 2013-11-10 DIAGNOSIS — I2692 Saddle embolus of pulmonary artery without acute cor pulmonale: Secondary | ICD-10-CM

## 2013-11-10 DIAGNOSIS — Z5181 Encounter for therapeutic drug level monitoring: Secondary | ICD-10-CM

## 2013-11-10 LAB — POCT INR: INR: 2.3

## 2013-12-23 ENCOUNTER — Ambulatory Visit (INDEPENDENT_AMBULATORY_CARE_PROVIDER_SITE_OTHER): Payer: Commercial Managed Care - HMO

## 2013-12-23 DIAGNOSIS — I2510 Atherosclerotic heart disease of native coronary artery without angina pectoris: Secondary | ICD-10-CM | POA: Diagnosis not present

## 2013-12-23 DIAGNOSIS — I2692 Saddle embolus of pulmonary artery without acute cor pulmonale: Secondary | ICD-10-CM | POA: Diagnosis not present

## 2013-12-23 DIAGNOSIS — Z5181 Encounter for therapeutic drug level monitoring: Secondary | ICD-10-CM | POA: Diagnosis not present

## 2013-12-23 DIAGNOSIS — E785 Hyperlipidemia, unspecified: Secondary | ICD-10-CM

## 2013-12-23 LAB — POCT INR: INR: 1.9

## 2014-01-20 ENCOUNTER — Ambulatory Visit (INDEPENDENT_AMBULATORY_CARE_PROVIDER_SITE_OTHER): Payer: Commercial Managed Care - HMO

## 2014-01-20 DIAGNOSIS — I2510 Atherosclerotic heart disease of native coronary artery without angina pectoris: Secondary | ICD-10-CM

## 2014-01-20 DIAGNOSIS — I2692 Saddle embolus of pulmonary artery without acute cor pulmonale: Secondary | ICD-10-CM

## 2014-01-20 DIAGNOSIS — Z5181 Encounter for therapeutic drug level monitoring: Secondary | ICD-10-CM

## 2014-01-20 LAB — POCT INR: INR: 2.5

## 2014-02-17 ENCOUNTER — Ambulatory Visit (INDEPENDENT_AMBULATORY_CARE_PROVIDER_SITE_OTHER): Payer: Medicare HMO

## 2014-02-17 DIAGNOSIS — Z5181 Encounter for therapeutic drug level monitoring: Secondary | ICD-10-CM | POA: Diagnosis not present

## 2014-02-17 DIAGNOSIS — I2692 Saddle embolus of pulmonary artery without acute cor pulmonale: Secondary | ICD-10-CM

## 2014-02-17 DIAGNOSIS — I2510 Atherosclerotic heart disease of native coronary artery without angina pectoris: Secondary | ICD-10-CM | POA: Diagnosis not present

## 2014-02-17 LAB — POCT INR: INR: 3.1

## 2014-02-17 MED ORDER — WARFARIN SODIUM 2 MG PO TABS: ORAL_TABLET | ORAL | Status: DC

## 2014-03-17 ENCOUNTER — Ambulatory Visit (INDEPENDENT_AMBULATORY_CARE_PROVIDER_SITE_OTHER): Payer: Commercial Managed Care - HMO

## 2014-03-17 DIAGNOSIS — I2692 Saddle embolus of pulmonary artery without acute cor pulmonale: Secondary | ICD-10-CM

## 2014-03-17 DIAGNOSIS — I2699 Other pulmonary embolism without acute cor pulmonale: Secondary | ICD-10-CM

## 2014-03-17 DIAGNOSIS — Z5181 Encounter for therapeutic drug level monitoring: Secondary | ICD-10-CM

## 2014-03-17 DIAGNOSIS — I2510 Atherosclerotic heart disease of native coronary artery without angina pectoris: Secondary | ICD-10-CM

## 2014-03-17 LAB — POCT INR: INR: 3.2

## 2014-04-07 ENCOUNTER — Ambulatory Visit (INDEPENDENT_AMBULATORY_CARE_PROVIDER_SITE_OTHER): Payer: Commercial Managed Care - HMO

## 2014-04-07 DIAGNOSIS — Z5181 Encounter for therapeutic drug level monitoring: Secondary | ICD-10-CM

## 2014-04-07 DIAGNOSIS — I2699 Other pulmonary embolism without acute cor pulmonale: Secondary | ICD-10-CM

## 2014-04-07 DIAGNOSIS — I2510 Atherosclerotic heart disease of native coronary artery without angina pectoris: Secondary | ICD-10-CM

## 2014-04-07 DIAGNOSIS — I2692 Saddle embolus of pulmonary artery without acute cor pulmonale: Secondary | ICD-10-CM

## 2014-04-07 LAB — POCT INR: INR: 2.5

## 2014-05-05 ENCOUNTER — Ambulatory Visit (INDEPENDENT_AMBULATORY_CARE_PROVIDER_SITE_OTHER): Payer: Commercial Managed Care - HMO

## 2014-05-05 DIAGNOSIS — I2510 Atherosclerotic heart disease of native coronary artery without angina pectoris: Secondary | ICD-10-CM | POA: Diagnosis not present

## 2014-05-05 DIAGNOSIS — I2699 Other pulmonary embolism without acute cor pulmonale: Secondary | ICD-10-CM

## 2014-05-05 DIAGNOSIS — I2692 Saddle embolus of pulmonary artery without acute cor pulmonale: Secondary | ICD-10-CM

## 2014-05-05 DIAGNOSIS — Z5181 Encounter for therapeutic drug level monitoring: Secondary | ICD-10-CM

## 2014-05-05 LAB — POCT INR: INR: 2.1

## 2014-05-13 ENCOUNTER — Encounter (HOSPITAL_COMMUNITY): Payer: Self-pay | Admitting: Cardiovascular Disease

## 2014-05-31 ENCOUNTER — Other Ambulatory Visit: Payer: Self-pay | Admitting: *Deleted

## 2014-05-31 DIAGNOSIS — I779 Disorder of arteries and arterioles, unspecified: Secondary | ICD-10-CM

## 2014-05-31 DIAGNOSIS — I739 Peripheral vascular disease, unspecified: Principal | ICD-10-CM

## 2014-06-03 ENCOUNTER — Encounter (INDEPENDENT_AMBULATORY_CARE_PROVIDER_SITE_OTHER): Payer: Commercial Managed Care - HMO

## 2014-06-03 DIAGNOSIS — I6523 Occlusion and stenosis of bilateral carotid arteries: Secondary | ICD-10-CM

## 2014-06-03 DIAGNOSIS — I779 Disorder of arteries and arterioles, unspecified: Secondary | ICD-10-CM

## 2014-06-03 DIAGNOSIS — I739 Peripheral vascular disease, unspecified: Principal | ICD-10-CM

## 2014-06-07 DIAGNOSIS — M0589 Other rheumatoid arthritis with rheumatoid factor of multiple sites: Secondary | ICD-10-CM | POA: Diagnosis not present

## 2014-06-09 ENCOUNTER — Ambulatory Visit (INDEPENDENT_AMBULATORY_CARE_PROVIDER_SITE_OTHER): Payer: Commercial Managed Care - HMO

## 2014-06-09 DIAGNOSIS — Z5181 Encounter for therapeutic drug level monitoring: Secondary | ICD-10-CM | POA: Diagnosis not present

## 2014-06-09 DIAGNOSIS — I2699 Other pulmonary embolism without acute cor pulmonale: Secondary | ICD-10-CM | POA: Diagnosis not present

## 2014-06-09 DIAGNOSIS — I2510 Atherosclerotic heart disease of native coronary artery without angina pectoris: Secondary | ICD-10-CM

## 2014-06-09 DIAGNOSIS — I2692 Saddle embolus of pulmonary artery without acute cor pulmonale: Secondary | ICD-10-CM

## 2014-06-09 LAB — POCT INR: INR: 2.1

## 2014-06-09 MED ORDER — WARFARIN SODIUM 2 MG PO TABS: ORAL_TABLET | ORAL | Status: DC

## 2014-06-30 DIAGNOSIS — M2550 Pain in unspecified joint: Secondary | ICD-10-CM | POA: Diagnosis not present

## 2014-06-30 DIAGNOSIS — M1A09X0 Idiopathic chronic gout, multiple sites, without tophus (tophi): Secondary | ICD-10-CM | POA: Diagnosis not present

## 2014-07-05 DIAGNOSIS — M069 Rheumatoid arthritis, unspecified: Secondary | ICD-10-CM | POA: Diagnosis not present

## 2014-07-05 DIAGNOSIS — J069 Acute upper respiratory infection, unspecified: Secondary | ICD-10-CM | POA: Diagnosis not present

## 2014-07-05 DIAGNOSIS — J209 Acute bronchitis, unspecified: Secondary | ICD-10-CM | POA: Diagnosis not present

## 2014-07-21 ENCOUNTER — Ambulatory Visit (INDEPENDENT_AMBULATORY_CARE_PROVIDER_SITE_OTHER): Payer: Commercial Managed Care - HMO

## 2014-07-21 DIAGNOSIS — I2510 Atherosclerotic heart disease of native coronary artery without angina pectoris: Secondary | ICD-10-CM | POA: Diagnosis not present

## 2014-07-21 DIAGNOSIS — I2699 Other pulmonary embolism without acute cor pulmonale: Secondary | ICD-10-CM | POA: Diagnosis not present

## 2014-07-21 DIAGNOSIS — Z5181 Encounter for therapeutic drug level monitoring: Secondary | ICD-10-CM | POA: Diagnosis not present

## 2014-07-21 DIAGNOSIS — I2692 Saddle embolus of pulmonary artery without acute cor pulmonale: Secondary | ICD-10-CM

## 2014-07-21 LAB — POCT INR: INR: 1.7

## 2014-07-21 MED ORDER — WARFARIN SODIUM 2 MG PO TABS: ORAL_TABLET | ORAL | Status: DC

## 2014-08-11 ENCOUNTER — Ambulatory Visit (INDEPENDENT_AMBULATORY_CARE_PROVIDER_SITE_OTHER): Payer: Commercial Managed Care - HMO

## 2014-08-11 DIAGNOSIS — I2510 Atherosclerotic heart disease of native coronary artery without angina pectoris: Secondary | ICD-10-CM | POA: Diagnosis not present

## 2014-08-11 DIAGNOSIS — I2692 Saddle embolus of pulmonary artery without acute cor pulmonale: Secondary | ICD-10-CM

## 2014-08-11 DIAGNOSIS — Z5181 Encounter for therapeutic drug level monitoring: Secondary | ICD-10-CM

## 2014-08-11 DIAGNOSIS — I2699 Other pulmonary embolism without acute cor pulmonale: Secondary | ICD-10-CM

## 2014-08-11 LAB — POCT INR: INR: 2.3

## 2014-08-31 DIAGNOSIS — M059 Rheumatoid arthritis with rheumatoid factor, unspecified: Secondary | ICD-10-CM | POA: Diagnosis not present

## 2014-08-31 DIAGNOSIS — Z79899 Other long term (current) drug therapy: Secondary | ICD-10-CM | POA: Diagnosis not present

## 2014-08-31 DIAGNOSIS — M2550 Pain in unspecified joint: Secondary | ICD-10-CM | POA: Diagnosis not present

## 2014-08-31 DIAGNOSIS — M1A09X0 Idiopathic chronic gout, multiple sites, without tophus (tophi): Secondary | ICD-10-CM | POA: Diagnosis not present

## 2014-09-08 ENCOUNTER — Ambulatory Visit (INDEPENDENT_AMBULATORY_CARE_PROVIDER_SITE_OTHER): Payer: Commercial Managed Care - HMO

## 2014-09-08 DIAGNOSIS — Z5181 Encounter for therapeutic drug level monitoring: Secondary | ICD-10-CM

## 2014-09-08 DIAGNOSIS — I2699 Other pulmonary embolism without acute cor pulmonale: Secondary | ICD-10-CM | POA: Diagnosis not present

## 2014-09-08 DIAGNOSIS — I2510 Atherosclerotic heart disease of native coronary artery without angina pectoris: Secondary | ICD-10-CM | POA: Diagnosis not present

## 2014-09-08 DIAGNOSIS — I2692 Saddle embolus of pulmonary artery without acute cor pulmonale: Secondary | ICD-10-CM

## 2014-09-08 LAB — POCT INR: INR: 1.6

## 2014-09-21 NOTE — Consult Note (Signed)
PATIENT NAME:  [name], [name] MR#:  [id number] DATE OF BIRTH:  07-Jan-1941  DATE OF CONSULTATION:  05/06/2012  REFERRING PHYSICIAN:  Dr. Michela Pitcher  CONSULTING PHYSICIAN:  Lautaro Koral S. Sherryll Burger, MD  PRIMARY CARE PHYSICIAN: Not listed.   REASON FOR CONSULTATION: Hypoxia.   HISTORY OF PRESENT ILLNESS: The patient is a 74 year old female with a known history of coronary artery disease status post CABG, DVTs and PE who has been seen for evaluation of hypoxia. The patient was admitted under surgical service on November 30th and underwent cholecystectomy on December 1st for acute cholecystitis along with appendectomy. The patient was slowly improving but today she was noticed to have worsening hypoxia and her oxygen levels were dropping in low 80's. She was placed on 2 to 3 liters oxygen to keep oxygen above 92%. Per her daughter and family members who were at the bedside, the patient has been more sleepy and they are worried that she is getting too much pain medicine also. The patient was given 40 mg of IV Lasix by the primary team as there was concern for possible fluid overload. At this time the patient still seems quite lethargic although she does open her eyes and communicate. The patient did have a low-grade fever of 99.9 earlier for which she received Tylenol. She denies any coughing. She has very minimal shortness of breath at this time. She was found to be tachycardic with heart rate up to 120's.   PAST MEDICAL HISTORY:  1. Situs inversus. 2. Coronary artery disease, status post coronary artery bypass graft x3.  3. 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  4. Gout.  5. Hypertension.  6. Obesity.  7. Dyslipidemia.   ALLERGIES: Shellfish and penicillin.   FAMILY HISTORY: Positive for dextrocardia and situs inversus in the cousins.   SOCIAL HISTORY: No tobacco, alcohol, or illicit drugs. She is a retired Automotive engineer entry person in 2004. Lives at home.   MEDICATIONS AT HOME:   1. Allopurinol 100 mg p.o. b.i.d.  2. Aspirin 325 mg p.o. daily.  3. Lasix 40 mg p.o. daily.  4. Gabapentin 300 mg p.o. daily.  5. Metoprolol 100 mg p.o. daily.  6. Omeprazole 20 mg p.o. daily.  7. Simvastatin 20 mg p.o. at bedtime.  8. Warfarin 2 mg p.o. daily.   REVIEW OF SYSTEMS: CONSTITUTIONAL: Low-grade fever. Positive for fatigue and weakness. EYES: No blurry or double vision. ENT: No tinnitus or ear pain. RESPIRATORY: No cough, wheezing, or hemoptysis. Positive for hypoxia and minimal shortness of breath. CARDIOVASCULAR: No chest pain, orthopnea, or edema. Positive for history of coronary artery disease and CABG, now tachycardic. GI: No nausea, vomiting, or diarrhea. She does have incisional tenderness and underwent cholecystectomy for acute cholecystitis. GU: No dysuria or hematuria. Has a Foley catheter indwelling. ENDOCRINE: No polyuria or nocturia. HEMATOLOGY: No anemia or easy bruising. SKIN: No rash or lesion. She has a surgical scar and has a JP drain draining serosanguineous fluid. MUSCULOSKELETAL: No arthritis or muscle cramp. NEUROLOGIC: No tingling, numbness, or weakness. PSYCHIATRIC: No history of anxiety or depression.   PHYSICAL EXAMINATION:   VITAL SIGNS: Temperature 99.9, heart rate 112 per minute, respirations 20 per minute, blood pressure 98/62 mmHg. She is saturating 84% to 92% on 3 liters oxygen via nasal cannula.   GENERAL: The patient is a 74 year old female lying in bed somewhat lethargic but easily arousable.   EYES: Pupils equal, round, reactive to light and accommodation. No scleral icterus. Extraocular muscles  intact.   HEENT: Head atraumatic, normocephalic. Oropharynx and nasopharynx clear.   NECK: Supple. No jugular venous distention. No thyroid enlargement or tenderness.   LUNGS: Clear to auscultation. Decreased breath sounds at the bases. No wheezing, rales, rhonchi, or crepitation.   CARDIOVASCULAR: S1, S2 normal, tachycardic. No murmurs, rubs, or  gallop.   ABDOMEN: Soft. Incisional tenderness present. She has a JP drain on her left upper corner draining secretion. No organomegaly or masses.   EXTREMITIES: No pedal edema, cyanosis, or clubbing.   NEUROLOGIC: Difficult evaluation as she is quite lethargic but seems nonfocal. Cranial nerves II through XII intact. Sensation intact.   PSYCHIATRIC: The patient is oriented to time, place, and person x3.   SKIN: No obvious rash, lesion, or ulcer except surgical incision as above.   LABORATORY, DIAGNOSTIC, AND RADIOLOGICAL DATA: Normal BMP except BUN of 24, creatinine 1.55, blood glucose 146. Normal liver function tests. CBC showed white count of 14.0, hemoglobin 9.8, hematocrit 28.8, platelets 193. PT 16.9. INR 1.3. ABG within normal limits except pO2 of 61.  Chest x-ray earlier today showed low lung volumes with mild basilar opacities likely secondary to atelectasis, infection, or aspiration are not excluded.   IMPRESSION AND PLAN:  1. Hypoxia likely secondary to atelectasis, although cannot rule out possible PE or pneumonia at this time. Considering her kidney function cannot do CT chest at this time. 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 Her INR today is 1.3. I have discussed the case with Dr. Juliann Pulse to decide if we can resume Coumadin. Will order daily coags. If her temperature spikes more than 101, will get two sets of blood culture and start on empiric antibiotic for possible pneumonia. Her last chest x-ray is not that impressive for an infiltrate. I have encouraged her to use incentive spirometry while awake at least every 2 to 4 hours and also cut back on her pain medication as this could be contributing to her confusion, hypotension, and hypoxia also. Will repeat chest x-ray in the morning and consider V/Q scan or CT of the chest if she continues to decline oxygenation-wise. For now, will continue oxygen via nasal  cannula.  2. Hypotension, likely over pain medication and use of Lasix earlier. Will hold off metoprolol and monitor her blood pressure. She already has p.r.n. Lopressor ordered which can be helpful for her tachycardia.   3. Leukocytosis, could be due to acute cholecystitis status post removal. She is on antibiotic in the form of Cipro and Flagyl. Her white count is improving. Will monitor.  4. Acute cholecystitis, status post cholecystectomy. Further management per Surgery.  5. Hyperlipidemia. Will resume statin.  6. Gout. She is on allopurinol.  7. History of DVT and PE. As mentioned above, would recommend resuming her Coumadin if it is okay. Currently she is on Lovenox once a day which will not fulfill her need for full dose anticoagulation. I would also recommend continuing her Lasix as ordered right now and watch her mental status and oxygen levels very closely.   TOTAL TIME TAKING CARE OF THIS PATIENT: 55 minutes.   CODE STATUS: FULL CODE.   Case was discussed with Dr. Juliann Pulse from Surgery team.  ____________________________ Ellamae Sia. Sherryll Burger, MD vss:drc D: 05/06/2012 19:27:04 ET T: 05/07/2012 08:24:35 ET JOB#: [id number]  cc: Lasheba Stevens S. Sherryll Burger, MD, <Dictator> Quentin Ore III, MD Ellamae Sia [facility] MD ELECTRONICALLY SIGNED 05/07/2012 22:55

## 2014-09-21 NOTE — Discharge Summary (Signed)
PATIENT NAME:  [name], [name] MR#:  [id number] DATE OF BIRTH:  09-18-1940  DATE OF ADMISSION:  05/03/2012 DATE OF DISCHARGE:  05/11/2012  FINAL DIAGNOSIS:  1.  Acute calculus cholecystitis.  2.  Sinus inversus totalis.  3.  History of coronary artery disease.  4.  History of deep vein thrombosis and pulmonary embolism.  5.  Gout.  6.  Hypertension.  7.  Obesity.  8.  Dyslipidemia.  9.  Congestive heart failure, resolved and compensated postoperative.   PRINCIPLE PROCEDURES: Open cholecystectomy 05/04/2012. Medicine consultation featuring [facility].   HOSPITAL COURSE SUMMARY: The patient was admitted with acute cholecystitis. She was brought to the Operating Room on 05/04/2012 and attempted laparoscopic cholecystectomy was performed, converted to open due to anatomical constraints. A Jackson-Pratt drain was left in place. Postoperatively, the patient did well. She had no bile in her drain. She did have some oxygen requirements and a chest x-ray consistent with congestive heart failure. She was able to be diuresed from this. Echocardiogram was performed. The patient was continuing to improve on postoperative day #4. Her breathing was improved. On postoperative day #5, the patient's wound was healing nicely. Drain was removed. Antibiotics were stopped. There was less work of breathing. Dulcolax suppository was given for constipation. The patient continued to improve and was stable for discharge on 05/11/2012 with followup in the office in 1 week's time.   DISCHARGE MEDICATIONS:  Simvastatin 20 mg by mouth once a day at bedtime, omeprazole 20 mg by mouth delayed-release capsule 1 tab by mouth daily, aspirin 325 mg by mouth once a day, gabapentin 300 mg by mouth orally, metoprolol 100 mg by mouth once a day, allopurinol 100 mg by mouth b.i.d., Lasix 40 mg by mouth once a day and warfarin 2 mg by mouth once a day.   DISCHARGE INSTRUCTIONS:  She will call with any questions or concerns and have her INR  checked through her primary care medical physician's office.    ____________________________ Redge Gainer. Egbert Garibaldi, MD mab:cs D: 05/25/2012 19:41:25 ET T: 05/25/2012 19:58:36 ET JOB#: [id number]  cc: Loraine Leriche A. Egbert Garibaldi, MD, <Dictator> Keion Neels A Ante Arredondo MD ELECTRONICALLY SIGNED 05/26/2012 0:04

## 2014-09-21 NOTE — Consult Note (Signed)
Brief Consult Note: Diagnosis: hypoxia.   Patient was seen by consultant.   Consult note dictated.   Recommend further assessment or treatment.   Orders entered.   Discussed with Attending MD.   Comments: 1. hypoxia: likely due to atelectesis but can't r/o Pneumonia or PE at this time, I strongly recommend putting her back on coumadin if ok with surgery (discussed with dr Juliann Pulse) considering history of recurrent DVTs and PEs. her INR is 1.3, will check daily coag's, if she spikes temp greater than 101 - will get 2 sets of blood c/s and start empiric antibiotic for possible Pneumonia but last cxr is not that impressive. encouraged use ofincentive spirometry and cut back on pain meds if possible as this may be contributing to her confusion, hypotension and hypoxia too. repeat cxr in am and consider CT chest/VQ if continues to decline. O2 via nasal cannula for now.  2. hypotension: hold off metoprolol and monitor. prn lopressor on for tachy  3. leukocytosis: could be due to 4, improving while on antibiotic (cipro + flagyl)  4. acute chole: status post CCY, furthur management per surgery.  Electronic Signatures: Patricia Pesa (MD)  (Signed 03-Dec-13 19:13)  Authored: Brief Consult Note   Last Updated: 03-Dec-13 19:13 by Patricia Pesa (MD)

## 2014-09-21 NOTE — H&P (Signed)
History of Present Illness Experienced LQ and epigastric pain after lunch today associated with nausea. Vomited with U/S here once. Had CBD stone in January and underwent ERCP/stone extraction but was found to have CAD and did not have CCY then. She had a CABG x 3 in January. Then had DVT, PEs and placed on coumadin. Her INR last week was ~ 3. 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 SHe has a first cousin with at least dextrocardia.    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 Distress, Rash, Hives  Penicillin: Swelling  HOME MEDICATIONS: Medication Instructions Status  simvastatin 20 mg oral tablet 1 tab(s) orally once a day (at bedtime) Active  omeprazole 20 mg oral delayed release capsule 1 cap(s) orally once a day Active  aspirin 325 mg oral tablet 1 tab(s) orally once a day Active  gabapentin 300 mg oral capsule cap(s) orally  Active  metoprolol tartrate 100 mg oral tablet tab(s) orally  Active  allopurinol 100 mg oral tablet 1 tab(s) orally 2 times a day Active  furosemide 40 mg oral tablet 1 tab(s) orally once a day Active  warfarin 2 mg oral tablet 1 tab(s) orally once a day Active   Family and Social History:   Family History Other  dextrocardia and possible situs inversus in cousin    Social History negative tobacco, negative ETOH, negative Illicit drugs, married, retired from data entry in 2004    Place of Living Home   Review of Systems:   Fever/Chills No    Cough No    Sputum No    Abdominal Pain Yes    Diarrhea No    Constipation No    Nausea/Vomiting Yes    SOB/DOE No    Chest Pain No    Dysuria No    Tolerating PT Yes    Tolerating Diet Yes  Nauseated  Vomiting    Medications/Allergies Reviewed Medications/Allergies reviewed   Physical Exam:   GEN well developed, no acute distress, obese    HEENT pink  conjunctivae, PERRL, hearing intact to voice, moist oral mucosa    NECK supple  trachea midline    RESP normal resp effort  clear BS  no use of accessory muscles    CARD regular rate  no murmur  no JVD    ABD positive tenderness  LUQ tender    LYMPH negative neck    EXTR negative cyanosis/clubbing, negative edema    SKIN normal to palpation, No ulcers, skin turgor good    NEURO cranial nerves intact, negative tremor, follows commands, motor/sensory function intact    PSYCH alert, A+O to time, place, person, good insight   Lab Results: Hepatic:  30-Nov-13 19:26    Bilirubin, Total 0.5   Alkaline Phosphatase  158   SGPT (ALT) 20   SGOT (AST) 23   Total Protein, Serum  8.5   Albumin, Serum 4.1  Routine Chem:  30-Nov-13 19:26    Glucose, Serum  177   BUN  34   Creatinine (comp)  1.59   Sodium, Serum 138   Potassium, Serum 4.2   Chloride, Serum 101   CO2, Serum 27   Calcium (Total), Serum 9.6   Osmolality (calc) 288   eGFR (African American)  38   eGFR (Non-African American)  33 (eGFR values <39m/min/1.73 m2 may be an indication  of chronic kidney disease (CKD). Calculated eGFR is useful in patients with stable renal function. The eGFR calculation will not be reliable in acutely ill patients when serum creatinine is changing rapidly. It is not useful in  patients on dialysis. The eGFR calculation may not be applicable to patients at the low and high extremes of body sizes, pregnant women, and vegetarians.)   Anion Gap 10   Lipase 209 (Result(s) reported on 03 May 2012 at 07:50PM.)  Cardiac:  30-Nov-13 07:26    CK, Total 70   CPK-MB, Serum 1.0 (Result(s) reported on 03 May 2012 at 08:37PM.)   Troponin I < 0.02 (0.00-0.05 0.05 ng/mL or less: NEGATIVE  Repeat testing in 3-6 hrs  if clinically indicated. >0.05 ng/mL: POTENTIAL  MYOCARDIAL INJURY. Repeat  testing in 3-6 hrs if  clinically indicated. NOTE: An increase or decrease  of 30% or more on serial   testing suggests a  clinically important change)  Routine Coag:  30-Nov-13 19:26    Prothrombin  19.1   INR 1.6 (INR reference interval applies to patients on anticoagulant therapy. A single INR therapeutic range for coumarins is not optimal for all indications; however, the suggested range for most indications is 2.0 - 3.0. Exceptions to the INR Reference Range may include: Prosthetic heart valves, acute myocardial infarction, prevention of myocardial infarction, and combinations of aspirin and anticoagulant. The need for a higher or lower target INR must be assessed individually. Reference: The Pharmacology and Management of the Vitamin K  antagonists: the seventh ACCP Conference on Antithrombotic and Thrombolytic Therapy. BWIOM.3559 Sept:126 (3suppl): [id number]. A HCT value >55% may artifactually increase the PT.  In one study,  the increase was an average of 25%. Reference:  "Effect on Routine and Special Coagulation Testing Values of Citrate Anticoagulant Adjustment in Patients with High HCT Values." American Journal of Clinical Pathology 2006;126:400-405.)   Activated PTT (APTT) 34.2 (A HCT value >55% may artifactually increase the APTT. In one study, the increase was an average of 19%. Reference: "Effect on Routine and Special Coagulation Testing Values of Citrate Anticoagulant Adjustment in Patients with High HCT Values." American Journal of Clinical Pathology 2006;126:400-405.)  Routine Hem:  30-Nov-13 19:26    WBC (CBC)  15.8   RBC (CBC) 4.21   Hemoglobin (CBC) 12.7   Hematocrit (CBC) 40.1   Platelet Count (CBC) 253 (Result(s) reported on 03 May 2012 at 07:55PM.)   MCV 95   MCH 30.1   MCHC  31.7   RDW  14.8     Assessment/Admission Diagnosis LUQ U/S - GSs, thick wall GB, positive Murphy's, 6 mm CBD  Acute Cholecystitis  Situs Inversus    Plan Lap CCY   Electronic Signatures: Consuela Mimes (MD)  (Signed [id number] 22:23)  Authored: CHIEF COMPLAINT and  HISTORY, ALLERGIES, HOME MEDICATIONS, FAMILY AND SOCIAL HISTORY, REVIEW OF SYSTEMS, PHYSICAL EXAM, LABS, ASSESSMENT AND PLAN   Last Updated: 30-Nov-13 22:23 by Consuela Mimes (MD)

## 2014-09-21 NOTE — Op Note (Signed)
PATIENT NAME:  [name], [name] MR#:  [id number] DATE OF BIRTH:  1940/06/25  DATE OF PROCEDURE:  05/04/2012  PREOPERATIVE DIAGNOSES:  1. Situs inversus.  2. Acute calculus cholecystitis.   POSTOPERATIVE DIAGNOSES:  1. Situs inversus. 2. Acute calculus cholecystitis.   PROCEDURE PERFORMED:  1. Attempted laparoscopic cholecystectomy.  2. Open cholecystectomy.  3. Incidental appendectomy.   SURGEON: Raynald Kemp, M.D.   ASSISTANT: Surgical scrub technologists times two.  TYPE OF ANESTHESIA: General oral endotracheal, Dr. Rowe Clack and associates.   INDICATIONS: This is a 74 year old white female with a known history of choledocholithiasis, cholelithiasis. She had an endoscopic retrograde cholangiogram done in January with stone extraction. Around this time the patient was diagnosed with significant coronary artery disease and underwent CABG. She presented to the Emergency Room last night with significant abdominal pain of several days' duration in the left upper quadrant. Clinical examination and ultrasonography is consistent with acute calculus cholecystitis. I discussed with her and her husband in detail attempted laparoscopic cholecystectomy, however due to the nature of the disease and situs inversus, the high likelihood of needing an open operation.   FINDINGS:  1. Situs inversus.  2. Purulence in the gallbladder.  3. Very short cystic duct with intrahepatic gallbladder.   SPECIMENS: Gallbladder with contents as well as appendix.   ESTIMATED BLOOD LOSS: 150 mL.   DRAINS: Jackson-Pratt left upper quadrant equivalent to Morrison's pouch.   DESCRIPTION OF PROCEDURE: With the patient in the supine position, general oral endotracheal anesthesia was induced without difficulty. Her right arm was left abducted. The patient's abdomen was widely prepped and draped with ChloraPrep solution. A time-out was observed.   A 12-mm blunt Hassan trocar was placed through an open technique through  an infraumbilical skin incision with stay sutures being passed through the fascia. Pneumoperitoneum was established. Photodocumentation was obtained. The liver was on the left side of the patient. The spleen was on the right, duodenum swept from right to left. The gallbladder was tensely distended and found to be largely intrahepatic.   A 5-mm Bladeless trocar was placed in the epigastric region and two 5-mm trocars were placed in the left upper quadrant. The gallbladder was initially decompressed of approximately 50 mL of purulent bile.   The gallbladder was grasped along its fundus and elevated towards the left shoulder. There was a dense inflammatory response around the infundibulum of the gallbladder, which was taken down with blunt technique. Point cautery was used on the omentum for hemostasis. Lateral traction was placed on Hartman's pouch, retracting it laterally and to the left. Attempt at dissection within the hepatoduodenal ligament resulted in a minor amount of bleeding and then a tear within the Hartman's pouch with spillage of purulent bile. This was aspirated and controlled with the grasper. After approximately 15 minutes of dissection in this area, I could not obtain clear visualization of ductal anatomy, and as such, an open procedure was performed.   Ports were then removed under direct visualization. A left upper quadrant transverse incision was fashioned with scalpel and electrocautery through musculofascial layers. Self-retaining abdominal wall retractor was placed. Two laparotomy tapes were placed behind the liver. The gallbladder was taken down in a dome-down fashion with blunt technique and finger fracture. It essentially peeled off the gallbladder fossa. It was largely intrahepatic. It allowed finger dissection down to a tapering found to be the cystic duct, which was rather high in the gallbladder fossa. No cystic artery was clearly identified. One small intervening lymphatic was  taken between hemoclips. Attempt at cholangiography was not possible due to the short and fibrotic nature of the cystic duct and cannulation was unable to be performed. The cystic duct was triply clipped on the liver side, singly clipped on the gallbladder side and divided. It was handed off the table as specimen.   Hemostasis was then obtained in the gallbladder fossa with point cautery. The area was irrigated copiously with normal saline times 500 mL and aspirated dry. A large piece of Surgicel and  Surgiflo with thrombin was applied. Attention was then turned to exploration of the abdomen. The ligament of Treitz was found to be to the right of the midline. No evidence of Ladd bands were identified.   The appendix was attached to a very floppy cecum which was able to be elevated up into the wound from the left lower quadrant and an incidental appendectomy was achieved by dividing the mesoappendix between clamps and ties of #0 Vicryl suture and the base of the appendix utilizing a GIA-55 stapler. The staple line was imbricated with 3-0 seromuscular Lembert type sutures of 3-0 silk.   With hemostasis being ensured on the operative field, a 19-mm Blake drain was directed into the left hepatorenal recess and exited the left lateral abdomen. LAP and needle counts were correct times two. The omentum was draped over the undersurface of the incision and the incision was then closed in two layers with running #0 PDS suture. Subcutaneous tissues were irrigated. Skin staples were applied. The infraumbilical fascial defect was then closed with an additional figure-of-eight #0 Vicryl suture in vertical orientation. The skin incision in the umbilicus was closed with staples. Drain stitch was applied of 4-0 nylon to the drain exit site. Sterile occlusive dressings were placed. At the completion of the operation, a Foley catheter was placed under sterile technique. The patient was then subsequently extubated and taken to  the recovery room in stable and satisfactory condition by anesthesia services.     ____________________________ Redge Gainer Egbert Garibaldi, MD mab:bjt D: 05/04/2012 18:30:30 ET T: 05/05/2012 09:33:28 ET JOB#: [id number]  cc: Loraine Leriche A. Egbert Garibaldi, MD, <Dictator> Raynald Kemp MD ELECTRONICALLY SIGNED 05/06/2012 7:48

## 2014-09-21 NOTE — Consult Note (Signed)
Chief Complaint:   Subjective/Chief Complaint improving. in pain but getting meds   VITAL SIGNS/ANCILLARY NOTES: **Vital Signs.:   05-Dec-13 05:43   Vital Signs Type Routine   Temperature Temperature (F) 98   Celsius 36.6   Temperature Source Oral   Pulse Pulse 109   Respirations Respirations 20   Systolic BP Systolic BP 132   Diastolic BP (mmHg) Diastolic BP (mmHg) 81   Mean BP 98   Pulse Ox % Pulse Ox % 93   Pulse Ox Activity Level  At rest   Oxygen Delivery 2L   Brief Assessment:   Cardiac Regular  no murmur    Respiratory normal resp effort  clear BS    Gastrointestinal Normal   Assessment/Plan:  Assessment/Plan:   Assessment 1. acute on chronic CHF: await echocardiogram to evaluate LV function. continue lasix. can't give ace-i anyway due to renal function abnormality  2. Hypotension: resolved, now getting tachy, will resume metoprolol   3. Leukocytosis: improving on Cipro and Flagyl. Her white count is improving. Will monitor.  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 on lasix  5. Hyperlipidemia: on statin.  6. Gout. She is on allopurinol.  7. History of DVT and PE: increase the dose of coumadin as inr 1.2 today, daily coag's 8. Acute cholecystitis, status post cholecystectomy. Further management per Surgery.    Plan discontinue foley if primary team is ok. encourage ambulation.  time spent: 20 mins   Electronic Signatures: Patricia Pesa (MD)  (Signed 05-Dec-13 13:04)  Authored: Chief Complaint, VITAL SIGNS/ANCILLARY NOTES, Brief Assessment, Assessment/Plan   Last Updated: 05-Dec-13 13:04 by Patricia Pesa (MD)

## 2014-09-21 NOTE — Consult Note (Signed)
Chief Complaint:   Subjective/Chief Complaint feeling much better, repeat cxr shows CHF. O2 down to 2 liters now and also looks more alert   VITAL SIGNS/ANCILLARY NOTES: **Vital Signs.:   04-Dec-13 09:29   Vital Signs Type Q 4hr   Temperature Temperature (F) 98.8   Celsius 37.1   Temperature Source Oral   Pulse Pulse 109   Respirations Respirations 18   Systolic BP Systolic BP 111   Diastolic BP (mmHg) Diastolic BP (mmHg) 70   Mean BP 83   Pulse Ox % Pulse Ox % 93   Pulse Ox Activity Level  At rest   Oxygen Delivery 2L; Nasal Cannula   Brief Assessment:   Cardiac Regular  no murmur    Respiratory normal resp effort  clear BS    Gastrointestinal Normal   Assessment/Plan:  Assessment/Plan:   Assessment 1. acute on chronic CHF: shortness of breath, hypoxia, cxr confirming same, lasix helped, now on scheduled dose, will get echocardiogram to evaluate LV function. no Pneumonia  2. Hypotension: improving, likely over pain medication and use of Lasix earlier. Will hold off metoprolol and monitor her blood pressure. She already has p.r.n. Lopressor ordered which can be helpful for her tachycardia.   3. Leukocytosis, could be due to acute cholecystitis status post removal. She is on antibiotic in the form of Cipro and Flagyl. Her white count is improving. Will monitor.  4. Acute cholecystitis, status post cholecystectomy. Further management per Surgery.  5. Hyperlipidemia: on statin.  6. Gout. She is on allopurinol.  7. History of DVT and PE: coumadin resumed, daly coag's - inr 1.3 today,    Plan recommend cutting back pain meds as tolerated and discontinue foley if primary team is ok.  time spent: 20 mins   Electronic Signatures: Patricia Pesa (MD)  (Signed 04-Dec-13 11:01)  Authored: Chief Complaint, VITAL SIGNS/ANCILLARY NOTES, Brief Assessment, Assessment/Plan   Last Updated: 04-Dec-13 11:01 by Patricia Pesa (MD)

## 2014-09-22 ENCOUNTER — Ambulatory Visit (INDEPENDENT_AMBULATORY_CARE_PROVIDER_SITE_OTHER): Payer: Commercial Managed Care - HMO

## 2014-09-22 DIAGNOSIS — Z5181 Encounter for therapeutic drug level monitoring: Secondary | ICD-10-CM

## 2014-09-22 DIAGNOSIS — I2699 Other pulmonary embolism without acute cor pulmonale: Secondary | ICD-10-CM | POA: Diagnosis not present

## 2014-09-22 DIAGNOSIS — I2510 Atherosclerotic heart disease of native coronary artery without angina pectoris: Secondary | ICD-10-CM | POA: Diagnosis not present

## 2014-09-22 DIAGNOSIS — I2692 Saddle embolus of pulmonary artery without acute cor pulmonale: Secondary | ICD-10-CM

## 2014-09-22 LAB — POCT INR: INR: 2.1

## 2014-09-24 NOTE — Discharge Summary (Signed)
PATIENT NAME:  [name], [name] MR#:  [id number] DATE OF BIRTH:  1940-11-24  DATE OF ADMISSION:  05/25/2013 DATE OF DISCHARGE:  05/28/2013  DISCHARGE DIAGNOSES:  1.  Klebsiella oxytoca sepsis with likely source being lungs with underlying bronchitis. No ESBL. The organism was sensitive to Levaquin and the patient will need to finish the course.    2.  Acute on chronic bronchiectasis with underlying Kartagener's  syndrome with situs inversus, will need oxygen at home via nasal cannula due to hypoxia from bronchiectasis. Elevated liver function tests likely from nausea, vomiting, but can have underlying liver dysfunction from Kartagener's syndrome.  They are improving.  3. Kartagener's syndrome with situs inversus. Recommend outpatient pulmonary evaluation.   SECONDARY DIAGNOSES: 1.  Coronary artery disease.  2.  History of deep vein thrombosis and pulmonary embolus on Coumadin.  3.  Gout.  4.  Dyslipidemia.  5.  Hypertension.  6.  Obesity.  7.  Rheumatoid arthritis.  8.  Coronary artery disease.   CONSULTATIONS: None.   PROCEDURES AND RADIOLOGY: CT scan of the abdomen and pelvis without contrast on the December 22, showed no acute pathology. Consolidation at the bases.   Chest x-ray on the December 22, showed no acute cardiopulmonary disease.   Abdominal ultrasound on the December 22, showed diffuse fatty liver. No focal liver lesion. Gallbladder is absent.   CT scan of the chest without contrast on the December 23, showed bilateral lower lobe airspace consolidation, likely chronic. Bilateral bronchiectasis, likely suggestive of Kartagener's syndrome situs inversus .   Major laboratory panel: Urinalysis on admission was negative.   Blood culture x 4 grew klebsiella, oxytoca pansensitive. Urine culture was contaminated.   Influenza A and negative.   HISTORY AND SHORT HOSPITAL COURSE: The patient is a 74 year old female with the above-mentioned medical problems who was admitted for  chest pain along with abdominal pain, nausea and vomiting, was found to have systemic inflammatory response syndrome on admission with likely source being lung infection. Subsequently, the patient was found to be septic with all four of her blood cultures being positive with Klebsiella with a likely source being lung from underlying acute bronchiectasis. Sputum culture was not obtained. The patient was on broad-spectrum antibiotic, including meropenem considering her possible underlying Kartagener's syndrome, but subsequently, the culture came back on  Klebsiella oxytoca sensitive to most of the antibiotics and she was switched to Levaquin. She was clinically doing much better and was feeling close to her baseline on the December 30m and is being discharged home in stable condition.   On the date of discharge, her vital signs were as follows:  Temperature 97.9, heart rate 77 per minute, respirations 20 per minute, blood pressure 107/70 mmHg, she is saturating 96% on 2 liters oxygen via nasal cannula. She did desaturate on room air and she will need oxygen 2 liters via nasal cannula for now, considering her underlying bronchiectasis.   PERTINENT PHYSICAL EXAMINATION ON THE DATE OF DISCHARGE:  CARDIOVASCULAR: S1, S2 normal. No murmurs, rubs or gallop.  LUNGS: Clear to auscultation bilaterally. No wheezing, rales, rhonchi or crepitation.  ABDOMEN: Soft, benign.  NEUROLOGIC: Nonfocal examination. All other physical examination remained at baseline.   DISCHARGE MEDICATIONS: 1.  Simvastatin 20 mg p.o. at bedtime.  2.  Omeprazole 20 mg p.o. daily.  3.  Aspirin 325 mg p.o. daily.  4.  Allopurinol 100 mg p.o. b.i.d.  5.  Lasix 40 mg p.o. daily.  6.  Gabapentin 300 mg p.o. b.i.d.  7.  Metoprolol 100 mg p.o. b.i.d. 8.  Warfarin 2 mg 2 tablets p.o. at bedtime.  9.  Alendronate 70 mg p.o. once a week on Friday.  10.  Vitamin D3, 1 tablet p.o. daily.  11.  Folic acid one tablet p.o. daily.  12.  Methotrexate  2.5 mg 6 tablets p.o. once a week on Monday.  13.  Levaquin 750 mg p.o. every 48 hours for 12 more doses.   DISCHARGE DIET: Low sodium.   DISCHARGE ACTIVITY: As tolerated.   DISCHARGE INSTRUCTIONS AND FOLLOW-UP: The patient was instructed to follow up with her primary care physician, Dr. Molly Maduro  the right at [place] tried that for in [place], in 1 to 2 weeks. He will need to, state she will need 2 liters oxygen via nasal cannula continuous for now until to be evaluated by her primary lung physician. She will need follow-up with a new lung physician at [facility] Dr. Elias [name] at [facility] in [place] in 1 to 2 weeks. She will need 2 liter oxygen via nasal cannula continuous for now until re-evaluated by her primary or lung physician.   She will need new lung physician at [facility], Dr. Max Fickle in 2 to 4 weeks.  She was also recommended to get chest physiotherapy as an outpatient considering her underling Kartagener syndrome and bronchiectasis.    TOTAL TIME DISCHARGING THIS PATIENT: 55 minutes ____________________________ Abdoulaye Drum S. Sherryll Burger, MD vss:cc D: 05/28/2013 14:19:18 ET T: 05/28/2013 18:25:38 ET JOB#: [id number]  cc: [name] S. Sherryll Burger, MD, <Dictator> Alyssa Leash, MD Alyssa Else, MD at [facility]  Ellamae Sia [facility] MD ELECTRONICALLY SIGNED 05/31/2013 10:27

## 2014-09-24 NOTE — H&P (Signed)
PATIENT NAME:  [name], REASONS MR#:  [id number] DATE OF BIRTH:  [birth year]  DATE OF ADMISSION:  05/25/2013  Addendum  The patient was started on empiric antibiotics for possibility of intra-abdominal infection. The CT scan of the chest and abdomen did not show any significant inflammatory changes in the abdomen, but it does show some consolidation at the level of the lungs, for which the consideration of atelectasis is there, but based on the presentation of the patient having cough, which has worsened with apparently increased secretions and fever, we are going to go ahead and start it and treat it for community-acquired pneumonia.   Since the patient has rheumatoid arthritis, her immune system also is compromised, for which the presentation of pneumonia could be atypical, as the patient presented mostly with chest pain and epigastric pain.   The patient is going to be started on Levaquin. She was previously started on Cipro and Flagyl, but we are going to stop those medications, as we were able to get a CT scan and no significant findings of intra-abdominal infection was seen.       ____________________________ Felipa Furnace, MD rsg:dmm D: 05/25/2013 21:03:37 ET T: 05/25/2013 21:31:15 ET JOB#: [id number]  cc: Felipa Furnace, MD, <Dictator> Presten Joost Juanda Chance MD ELECTRONICALLY SIGNED 05/31/2013 12:59

## 2014-09-24 NOTE — H&P (Signed)
PATIENT NAME:  [name], [name] MR#:  [id number] DATE OF BIRTH:  09/02/1940  DATE OF ADMISSION:  05/25/2013  PRIMARY CARE PHYSICIAN: Nonlocal.   REFERRING PHYSICIAN: Daryel November, MD  CHIEF COMPLAINT: Initially chest pain then abdominal pain, nausea and vomiting.   HISTORY OF PRESENT ILLNESS: This is a very nice 74 year old female who has a history of situs inversus, coronary artery disease, DVT, and PE in the past on Coumadin, subtherapeutic today, with history of gout, hypertension, hyperlipidemia and obesity.   The patient comes today with chief complaint of chest pain that apparently started last night, felt nausea continuously, the chest pain never went away. It was 9 out of 10 at the beginning and then started getting better but never completely resolved. Now her pain is 6 out of 10. The pain is mostly actually epigastric. The patient points with her finger down behind the xiphoid process and she states that it radiates up into her chest. It also radiates into the back and feels like pressure-like and throbbing.   The patient had two episodes of vomiting while she was here in the ER. She says she was nauseous all night, but the vomiting just started now.   The patient has cough which is chronic. She states that she has had cough with congestion all her life but recently has increased a little bit on intensity.   The patient had no fever prior to today and she states that she does not have any changes on secretions.   She denies any dysuria and her urinalysis pending right now.   The patient is admitted for evaluation of this problem. She has also elevated LFTs that were not detected prior.   The patient looks ill, toxic, but overall is hemodynamically stable so far.   The patient meets criteria for systemic inflammatory response syndrome. The patient admitted on IV antibiotics and IV fluids.   REVIEW OF SYSTEMS: A 12-system review of systems is done.  CONSTITUTIONAL: The  patient denies fever prior to today. Today, her temperature was 101.8. Positive fatigue and weakness today.  EYES: No blurry vision, double vision.  EARS, NOSE, THROAT: No tinnitus or difficulty swallowing.  RESPIRATORY: Positive chronic cough, chronic wheezing. No hemoptysis. Positive chronic dyspnea. No painful respiration. The patient denies COPD.  She has never smoked.  CARDIOVASCULAR: No orthopnea or edema. Positive chest pain as mentioned above which is mostly epigastric. No palpitations. No syncope.  GASTROINTESTINAL: No abdominal pain up until now although she describes that her chest pain was mostly epigastric rather than chest pain. Positive nausea all night. Positive vomiting twice today. No hematemesis. No jaundice. No rectal bleeding or melena.  GENITOURINARY: No dysuria, hematuria, changes in frequency. The patient is dehydrated and has not urinated in several hours.  GYNECOLOGIC: No breast masses.  ENDOCRINE: No polyuria, polydipsia, heat or  cold or heat intolerance.  HEMATOLOGIC AND LYMPHATIC: No anemia, easy bruising or bleeding. She has had multiple episodes of elevation of white blood count, but she has never been told that her white count is elevated for any specific reason.  SKIN: No rashes, petechiae, new lesions.  MUSCULOSKELETAL: No neck pain, back pain or gout.  NEUROLOGIC: No numbness, tingling, or headaches.  PSYCHIATRIC: No significant insomnia, depression,   PAST MEDICAL HISTORY:  1.  Situs inversus. 2.  Coronary artery disease.  3.  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  4.  Gout.  5.  Dyslipidemia.  6.  Hypertension.  7.  Obesity.  8.   Rheumatoid arthritis.   PAST SURGICAL HISTORY:  1.  IVC filter.  2.  CABG.  3.  Cholecystectomy.     ALLERGIES: PENICILLIN AND SHELLFISH GIVE A RASH.   FAMILY HISTORY:  1.  Positive for dextrocardia and situs inversus in multiple members of her family.  2.  No cancer. No  MIs.   SOCIAL HISTORY: The patient has never smoked. She does not drink. She does not use drugs. 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   CURRENT MEDICATIONS: Include warfarin 2 mg two tablets once a day, vitamin D once daily, simvastatin 20 mg daily, omeprazole 20 mg daily, metoprolol tartrate 100 mg twice daily, methotrexate 2.5 mg once daily, gabapentin 300 mg twice daily, furosemide 40 mg once a day, folic acid 1 mg once a day, aspirin 325 mg once daily, allopurinol 100 mg twice daily, alendronate 70 mg once a week.    PHYSICAL EXAMINATION:  VITAL SIGNS: Blood pressure 110/60, pulse 96, respirations 20, temperature 101.8. Oxygen saturation was 99 on room air. She was having low saturations to 90s whenever she arrived, but they improved significantly without oxygen.  GENERAL: Alert and oriented x3. She looks toxic. She looks sweaty, uncomfortable.  HEENT: Her pupils are equal and reactive. Extraocular movements are intact. Mucosae are moist. Anicteric sclerae. Pink conjunctivae. No oral lesions. No oropharyngeal exudates.  NECK: Supple. No JVD. No thyromegaly. No adenopathy. No carotid bruits. No rigidity.  CARDIOVASCULAR: Regular rate and rhythm. No murmurs, rubs or gallops are appreciated at this moment. No displacement of PMI.  LUNGS: Clear overall but there is significant decrease of respiratory sounds in both bases. Whenever she coughs, there is significant movement of secretions but prior to that, they were clear. No use of accessory muscles.  ABDOMEN: Soft, tender to palpation of epigastric area and left upper quadrant. There is no rebound tenderness. There are no masses. The patient has situs inversus for what her liver is actually on the left side and is not palpable at this moment.  GENITAL: Negative for external lesions.  EXTREMITIES: No edema, cyanosis or clubbing.  SKIN: No rashes or petechiae.  LYMPHATIC: Negative for lymphadenopathy in the neck or  supraclavicular areas.  VASCULAR: Pulses +2. Capillary refill less than 3.  MUSCULOSKELETAL: No significant joint effusions or joint swelling.  NEUROLOGIC: Cranial nerves II through XII intact. Strength is 5/5 in four extremities.  PSYCHIATRIC: No significant agitation.   LABORATORY, DIAGNOSTIC AND RADIOLOGICAL DATA: Urinalysis is pending.   EKG: Normal sinus rhythm with a pattern of right bundle branch block. There is T-wave inversion at the level of leads V1, V2, which are related to her bundle branch. No other acute changes or new changes. Compared with previous EKGs, are unchanged.   CHEST X-RAY: No significant acute abnormalities. Bibasilar atelectasis and low volume ultrasound of the abdomen shows a large fatty liver. No gall bladder.   INR is 1.8. White count is 20,000, hemoglobin is 13,000, platelet count 271. Troponin is 0.02. LFTs has alkaline phosphatase at 211, AST of 242, ALT of 193. Total protein is elevated at 8.4. She has chronic kidney disease which is stage III. Baseline is around 1.3 to 1.5, today is 1.48. BUN is slightly elevated, more elevated than usual, 33, and her glucose is 114.   ASSESSMENT AND PLAN: This is a very nice a 74 year old female who came with the presentation of chest pain. Evolution in the ER actually shows  nausea, vomiting, abdominal pain, and fever. The patient admitted for evaluation of this condition.  1.  Systemic inflammatory response syndrome. The patient had a fever to 101.8, tachycardic with significant increase of white blood count of 20,000. The white blood count has been elevated in the past, but the patient states that she does not have any chronic history of leukocytosis. We are going to start treatment with antibiotics for possible intra-abdominal infection with ciprofloxacin and metronidazole. We are going to do a CT scan. The CT scan is not going to be ready for several hours. Blood cultures have been obtained. Flu has been sent. Urinalysis has  been sent. The patient is going to be poly- culture.  2.  Epigastric pain, presented as a chest pain. Two troponins are negative. EKG changes are chronic.  Pretty much, we ruled out acute coronary syndrome for now. Her chest pain was mostly abdominal pain just in the epigastric area for what we are going to do again a CT scan. We are going to put her on a double dose of proton pump inhibitor, treat pain with morphine. Will treat nausea with Zofran and Phenergan. Consider the possibility of pancreatitis, although the lipase is normal on the high side in the 200s. We are going to get a CT scan to evaluate this. Due to situs inversus, the patient could have a different pathology. Her pain is mostly located in the left side and she has elevated liver function tests for which that is the reason that the pain is on the left side.  3.  Elevation of LFTs. The patient does not have a gallbladder. No jaundice. No signs of obstruction at this moment. Possibility is that she has have hepatitis. We are going to send hepatitis A, hepatitis B and C. If no improvement, we are going to send a consultation with gastroenterology. Depending on the CT scan, consider an magnetic resonance cholangiopancreatography as a probability for diagnostic purposes. Consider on the differential the possibility of dead bowel. We are going to get a lactic acid. We are going to send an LDH as the patient could have also malignancy, lymphoma versus leukemia. As the patient has a chronic elevation of white blood cells, we are going to send that also for a manual  differential.  4.  History of coronary artery disease. At this moment, the patient does not have any active symptoms. The chest pain, again, seems more like epigastric. We are going to continue aspirin for now as a precaution. Continue warfarin as the patient is subtherapeutic and has a history of severe embolism. Monitor PT-INR daily. At this moment, I am not going to ahead and   anticoagulate with Lovenox unless her INR continued to come low.  5.  As far as her gout, continue allopurinol.  6.  As far as her hyperlipidemia, continue her simvastatin. The patient takes methotrexate due to rheumatoid arthritis. Continue same dose of 6 tablets once a week on Monday.  7.  Hypertension. The patient takes metoprolol which she will continue at this moment, and since she has history of chronic edema, continue furosemide.  8.  All her other medical problems are stable. We are going to continue to monitor. Will get a CT scan to evaluate her possible diagnosis of intraabdominal process/infection.   TIME SPENT: I spent about 60 minutes with this patient today.   ____________________________ Felipa Furnace, MD rsg:np D: 05/25/2013 16:41:25 ET T: 05/25/2013 17:46:24 ET JOB#: [id number]  cc: Felipa Furnace, MD, <  Dictator> Lateesha Bezold Juanda Chance MD ELECTRONICALLY SIGNED 05/31/2013 12:59

## 2014-09-28 DIAGNOSIS — M059 Rheumatoid arthritis with rheumatoid factor, unspecified: Secondary | ICD-10-CM | POA: Diagnosis not present

## 2014-09-28 DIAGNOSIS — Z9229 Personal history of other drug therapy: Secondary | ICD-10-CM | POA: Diagnosis not present

## 2014-10-07 DIAGNOSIS — G629 Polyneuropathy, unspecified: Secondary | ICD-10-CM | POA: Diagnosis not present

## 2014-10-07 DIAGNOSIS — E782 Mixed hyperlipidemia: Secondary | ICD-10-CM | POA: Diagnosis not present

## 2014-10-07 DIAGNOSIS — K219 Gastro-esophageal reflux disease without esophagitis: Secondary | ICD-10-CM | POA: Diagnosis not present

## 2014-10-07 DIAGNOSIS — E1122 Type 2 diabetes mellitus with diabetic chronic kidney disease: Secondary | ICD-10-CM | POA: Diagnosis not present

## 2014-10-07 DIAGNOSIS — M109 Gout, unspecified: Secondary | ICD-10-CM | POA: Diagnosis not present

## 2014-10-07 DIAGNOSIS — N183 Chronic kidney disease, stage 3 (moderate): Secondary | ICD-10-CM | POA: Diagnosis not present

## 2014-10-07 DIAGNOSIS — I129 Hypertensive chronic kidney disease with stage 1 through stage 4 chronic kidney disease, or unspecified chronic kidney disease: Secondary | ICD-10-CM | POA: Diagnosis not present

## 2014-10-07 DIAGNOSIS — E0842 Diabetes mellitus due to underlying condition with diabetic polyneuropathy: Secondary | ICD-10-CM | POA: Diagnosis not present

## 2014-10-13 ENCOUNTER — Ambulatory Visit (INDEPENDENT_AMBULATORY_CARE_PROVIDER_SITE_OTHER): Payer: Commercial Managed Care - HMO

## 2014-10-13 DIAGNOSIS — I2510 Atherosclerotic heart disease of native coronary artery without angina pectoris: Secondary | ICD-10-CM | POA: Diagnosis not present

## 2014-10-13 DIAGNOSIS — I2699 Other pulmonary embolism without acute cor pulmonale: Secondary | ICD-10-CM | POA: Diagnosis not present

## 2014-10-13 DIAGNOSIS — I2692 Saddle embolus of pulmonary artery without acute cor pulmonale: Secondary | ICD-10-CM

## 2014-10-13 DIAGNOSIS — Z5181 Encounter for therapeutic drug level monitoring: Secondary | ICD-10-CM | POA: Diagnosis not present

## 2014-10-13 LAB — POCT INR: INR: 1.9

## 2014-11-10 ENCOUNTER — Ambulatory Visit (INDEPENDENT_AMBULATORY_CARE_PROVIDER_SITE_OTHER): Payer: Commercial Managed Care - HMO

## 2014-11-10 DIAGNOSIS — I2699 Other pulmonary embolism without acute cor pulmonale: Secondary | ICD-10-CM

## 2014-11-10 DIAGNOSIS — I2692 Saddle embolus of pulmonary artery without acute cor pulmonale: Secondary | ICD-10-CM

## 2014-11-10 DIAGNOSIS — I2510 Atherosclerotic heart disease of native coronary artery without angina pectoris: Secondary | ICD-10-CM | POA: Diagnosis not present

## 2014-11-10 DIAGNOSIS — Z5181 Encounter for therapeutic drug level monitoring: Secondary | ICD-10-CM

## 2014-11-10 LAB — POCT INR: INR: 2.2

## 2014-11-10 MED ORDER — WARFARIN SODIUM 2 MG PO TABS: ORAL_TABLET | ORAL | Status: DC

## 2014-11-26 ENCOUNTER — Encounter: Payer: Self-pay | Admitting: Cardiovascular Disease

## 2014-11-26 ENCOUNTER — Ambulatory Visit (INDEPENDENT_AMBULATORY_CARE_PROVIDER_SITE_OTHER): Payer: Commercial Managed Care - HMO | Admitting: Cardiovascular Disease

## 2014-11-26 ENCOUNTER — Other Ambulatory Visit: Payer: Self-pay | Admitting: *Deleted

## 2014-11-26 VITALS — BP 98/64 | HR 73 | Ht 66.0 in | Wt 175.0 lb

## 2014-11-26 DIAGNOSIS — R42 Dizziness and giddiness: Secondary | ICD-10-CM

## 2014-11-26 DIAGNOSIS — I2692 Saddle embolus of pulmonary artery without acute cor pulmonale: Secondary | ICD-10-CM

## 2014-11-26 DIAGNOSIS — I2699 Other pulmonary embolism without acute cor pulmonale: Secondary | ICD-10-CM

## 2014-11-26 DIAGNOSIS — I2510 Atherosclerotic heart disease of native coronary artery without angina pectoris: Secondary | ICD-10-CM | POA: Diagnosis not present

## 2014-11-26 DIAGNOSIS — R0602 Shortness of breath: Secondary | ICD-10-CM

## 2014-11-26 MED ORDER — METOPROLOL TARTRATE 25 MG PO TABS: 25.0000 mg | ORAL_TABLET | Freq: Two times a day (BID) | ORAL | Status: DC

## 2014-11-26 NOTE — Patient Instructions (Signed)
Medication Instructions:  Your physician has recommended you make the following change in your medication: Decrease metoprolol tartrate to 25 mg by mouth twice daily.   Labwork: none  Testing/Procedures: Your physician has requested that you have a lexiscan myoview. For further information please visit [email or web address] Please follow instruction sheet, as given.    Follow-Up: Your physician recommends that you schedule a follow-up appointment in: about 4 weeks. --Scheduled for December 27, 2014 at 9:45

## 2014-11-26 NOTE — Progress Notes (Signed)
Chief Complaint  Patient presents with  . Shortness of Breath    History of Present Illness: 74 yo female with history of CAD s/p CABG, PE, carotid artery disease who is here today for cardiac follow up. She was admitted 12/28-1/22/13 with symptoms of pancreatitis. Diagnosed with cholelithiasis and underwent ERCP with sphincterotomy and stone extraction. Following this, she developed pulmonary edema and ruled in for an NSTEMI. Stress testing was undertaken and demonstrated an EF 20% and situs inversus which limited interpretation of the study. LHC was performed 06/01/11. This confirmed dextrocardia. 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 EF was 40-45% by cardiac catheterization. Echocardiogram 06/05/11: EF 40-45%, anteroseptal hypokinesis, apical hypokinesis, mild LAE. CT scan was recommended and performed 06/12/11. This demonstrated situs inversus totalis. What would typically be called RCA arose from anterior sinus of Valsalva. What would typically be called the left main arises from the posterior sinus of Valsalva and gives rise to a large first diagonal branch and diminutive circumflex. She was then seen by Dr. Laneta Simmers of [facility]. CABG was recommended. Gen. Surgery felt that cholecystectomy could be done in the future as cardiac risk outweighed further intervention for her gallbladder. CABG was performed 06/14/11: RIMA-LAD, SVG-ramus, SVG-RCA. Pre-CABG Dopplers 06/12/11: LICA 60-79%. I saw her in March 2013 and she was c/o dyspnea. I discussed a CTA chest to exclude PE and a CXR but she refused. She was admitted March 10,2013 to [facility] with right-sided pleuritic chest pain and dyspnea. She underwent CT scanning which showed a large saddle embolus. Echocardiogram showed right heart strain and a possible mass in the left atrium as well as moderate sized pericardial effusion, LVEF 50-55%. She did not meet  criteria for thrombolysis. Bilateral lower extremity dopplers were positive for subacute DVT noted throughout the right lower extremity. There was acute, occlusive DVT noted in the posterior tibial vein of the left lower extremity. She was started on heparin and coumadin. She continued to have high O2 requirements and decision was made to place IVC filter. Hospital course was complicated by volume overload in setting of RV strain secondary to PE / diastolic failure. She was aggressively diuresed to achieve negative balance. 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 Repeat carotid dopplers 06/03/14 40-59% RICA stenosis, stable 60-79% LICA stenosis. Repeat echo 04/22/12 with normal LV size and function with mild valve disease and no pericardial effusion. She had her cholecystectomy in December 2013. Admitted to [facility] December 2014 with chest pain and she was treated for pneumonia.    She is here today for cardiac follow up. She describes chest pain and SOB with exertion. This is a change over the last few months. No weight gain. NO change in LE edema. She has had recent bronchitis and has been treated in primary care and has been on prednisone and antibiotics. She still has a cough. She is having no bleeding problems on coumadin. Some dizziness when standing.   Primary Care Physician: Elias Else  Last Lipid Profile:Lipid Panel Most recent in Dr. Benjaman Pott office January 2014, controlled per pt.   Past Medical History  Diagnosis Date  . Arthritis   . Neuropathy   . GERD (gastroesophageal reflux disease)   . Hypertension   . HLD (hyperlipidemia)   . Situs inversus with dextrocardia     CT 06/2011:  situs inversus totalis.  What would typically be called RCA arose from anterior sinus of Valsalva.  What would typically be called the left main arises from the posterior sinus of Valsalva and gives rise to a large first  diagonal branch and diminutive circumflex  . Dextrocardia   . Fracture 05/24/2011    right; "did not have surgery"  . DVT of leg (deep venous thrombosis) ~2006    left  . Pneumonia 06/13/11    "couple times; long time ago"  . CAD (coronary artery disease)     NSTEMI in setting of gallstone pancreatitis 05/2011:  LHC with 3v CAD; CABG was performed 06/14/11: RIMA-LAD, SVG-ramus, SVG-RCA  . Ischemic cardiomyopathy     Echocardiogram 06/05/11: EF 40-45%, anteroseptal hypokinesis, apical hypokinesis, mild LAE  . Chronic bronchitis   . Anemia   . H/O hiatal hernia   . Chronic systolic heart failure   . Carotid stenosis     Dopplers 06/12/11: LICA 60-79%.  . Pulmonary embolus March 2013    Past Surgical History  Procedure Laterality Date  . Ercp  06/03/2011    Procedure: ENDOSCOPIC RETROGRADE CHOLANGIOPANCREATOGRAPHY (ERCP);  Surgeon: Petra Kuba, MD;  Location: [facility] OR;  Service: Endoscopy;  Laterality: N/A;  . Vaginal hysterectomy  1970's  . Cardiac catheterization  06/11/11  . Coronary artery bypass graft  06/14/2011    Procedure: CORONARY ARTERY BYPASS GRAFTING (CABG);  Surgeon: Alleen Borne, MD;  Location: [facility] OR;  Service: Open Heart Surgery;  Laterality: N/A;  . Left heart catheterization with coronary angiogram N/A 06/11/2011    Procedure: LEFT HEART CATHETERIZATION WITH CORONARY ANGIOGRAM;  Surgeon: Kathleene Hazel, MD;  Location: [facility] CATH LAB;  Service: Cardiovascular;  Laterality: N/A;  . Arch aortogram Right 06/11/2011    Procedure: ARCH AORTOGRAM;  Surgeon: Kathleene Hazel, MD;  Location: [facility] CATH LAB;  Service: Cardiovascular;  Laterality: Right;    Current Outpatient Prescriptions  Medication Sig Dispense Refill  . alendronate (FOSAMAX) 70 MG tablet Take 70 mg by mouth once a week. Take with a full glass of water on an empty stomach.    Marland Kitchen allopurinol (ZYLOPRIM) 100 MG tablet Take 200 mg by mouth daily.      Marland Kitchen aspirin 325 MG tablet Take 325 mg by mouth daily.      Marland Kitchen  BOOSTRIX 5-2.5-18.5 injection     . FOLIC ACID PO Take 10 mg by mouth daily.    . furosemide (LASIX) 40 MG tablet Take 40 mg by mouth daily.    Marland Kitchen gabapentin (NEURONTIN) 300 MG capsule Take 300 mg by mouth 2 (two) times daily.      . Loratadine 10 MG CAPS     . Magnesium 500 MG CAPS Take 1 capsule by mouth daily. PRN    . metFORMIN (GLUCOPHAGE-XR) 500 MG 24 hr tablet Takes twice a week    . methotrexate (RHEUMATREX) 2.5 MG tablet Take 2.5 mg by mouth daily.    Marland Kitchen omeprazole (PRILOSEC) 20 MG capsule Take 20 mg by mouth every morning.      . simvastatin (ZOCOR) 20 MG tablet Take 20 mg by mouth daily.    . Vitamins A & D (VITAMIN A & D) 10000-400 UNITS CAPS     . warfarin (COUMADIN) 2 MG tablet Take as directed by coumadin clinic 65 tablet 3  . ZOSTAVAX 81856 UNT/0.65ML injection     . metoprolol tartrate (LOPRESSOR) 25 MG tablet Take 1 tablet (25 mg total) by mouth 2 (two)  times daily. 180 tablet 3   No current facility-administered medications for this visit.    Allergies  Allergen Reactions  . Penicillins Anaphylaxis  . Shellfish Allergy     History   Social History  . Marital Status: Married    Spouse Name: N/A  . Number of Children: N/A  . Years of Education: N/A   Occupational History  . Not on file.   Social History Main Topics  . Smoking status: Never Smoker   . Smokeless tobacco: Never Used  . Alcohol Use: No  . Drug Use: No  . Sexual Activity: No   Other Topics Concern  . Not on file   Social History Narrative    No family history on file.  Review of Systems:  As stated in the HPI and otherwise negative.   BP 98/64 mmHg  Pulse 73  Ht 5\' 6"  (1.676 m)  Wt 175 lb (79.379 kg)  BMI 28.26 kg/m2  Physical Examination: General: Well developed, well nourished, NAD HEENT: OP clear, mucus membranes moist SKIN: warm, dry. No rashes. Neuro: No focal deficits Musculoskeletal: Muscle strength 5/5 all ext Psychiatric: Mood and affect normal Neck: No JVD, no  carotid bruits, no thyromegaly, no lymphadenopathy. Lungs:Clear bilaterally, no wheezes, rhonci, crackles Cardiovascular: Regular rate and rhythm. No murmurs, gallops or rubs. Abdomen:Soft. Bowel sounds present. Non-tender.  Extremities: No lower extremity edema. Pulses are 2 + in the bilateral DP/PT.  Carotid artery dopplers: 06/03/14 40-59% RICA stenosis, 60-79% LICA stenosis.   Echo 04/22/12: Left ventricle: The cavity size was normal. Wall thickness was normal. Systolic function was normal. The estimated ejection fraction was in the range of 55% to 60%. Wall motion was normal; there were no regional wall motion abnormalities. Doppler parameters are consistent with abnormal left ventricular relaxation (grade 1 diastolic dysfunction). - Aortic valve: There was no stenosis. - Mitral valve: Mild regurgitation. - Right ventricle: The cavity size was normal. Systolic function was normal. - Tricuspid valve: Peak RV-RA gradient: 38mm Hg (S). - Pulmonary arteries: PA peak pressure: 31mm Hg (S). - Inferior vena cava: The vessel was normal in size; the respirophasic diameter changes were in the normal range (= 50%); findings are consistent with normal central venous pressure. - Pericardium, extracardiac: There was no pericardial effusion. Impressions:  - Normal LV size and systolic function, EF 55-60%. Normal RV size and systolic function. Mild mitral regurgitation.  EKG:  EKG is ordered today. The ekg ordered today demonstrates Sinus, rate 73 bpm. PVCs. Right sided EKG with situs inversus. Appears to be a RBBB with inferior infarct, unchanged.   Recent Labs: No results found for requested labs within last 365 days.   Lipid Panel    Component Value Date/Time   CHOL 124 06/14/2011 0400   TRIG 91 06/14/2011 0400   HDL 36* 06/14/2011 0400   CHOLHDL 3.4 06/14/2011 0400   VLDL 18 06/14/2011 0400   LDLCALC 70 06/14/2011 0400     Wt Readings from Last 3 Encounters:  11/26/14 175  lb (79.379 kg)  10/29/13 186 lb (84.369 kg)  09/30/12 186 lb (84.369 kg)     Other studies Reviewed: Additional studies/ records that were reviewed today include: . Review of the above records demonstrates:    Assessment and Plan:   1. Saddle embolus of pulmonary artery/CVT: She is on coumadin therapy and will need this for lifetime.   2. Carotid stenosis: Stable 06/03/14. Known 60-79% LICA stenosis.   3. CAD: She is s/p bypass. Continue ASA, statin  and beta blocker.  With recent chest pains and dyspnea, will arrange Lexiscan stress myoview to exclude ischemia.   4. 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 Continue coumadin.   5. Pericardial effusion: Resolved on echo 04/22/12.    6. Dizziness: Will lower metoprolol down to 25 mg BID  7. Ischemic cardiomyopathy/Chronic systolic CHF: Weight stable. LE edema stable. Continue current meds. She is on daily lasix. Continue beta blocker. No ace-inh with hypotension.   Current medicines are reviewed at length with the patient today.  The patient does not have concerns regarding medicines.  The following changes have been made:  Changed metoprolol  Labs/ tests ordered today include:   Orders Placed This Encounter  Procedures  . Myocardial Perfusion Imaging  . EKG 12-Lead    Disposition:   FU with me  in 4 weeks  Signed, Verne Carrow, MD 11/26/2014 5:19 PM    [facility] [address] [place], [place], [place]  [address] Phone: [phone]; Fax: [phone]

## 2014-12-07 ENCOUNTER — Telehealth (HOSPITAL_COMMUNITY): Payer: Self-pay

## 2014-12-07 NOTE — Telephone Encounter (Signed)
Patient given detailed instructions per Myocardial Perfusion Study Information Sheet for test on 12-09-2014 at 11:30am. Patient Notified to arrive 15 minutes early, and that it is imperative to arrive on time for appointment to keep from having the test rescheduled. Patient verbalized understanding.Randa Evens, Tamu Golz A

## 2014-12-08 ENCOUNTER — Telehealth: Payer: Self-pay | Admitting: Cardiovascular Disease

## 2014-12-08 NOTE — Telephone Encounter (Signed)
Error

## 2014-12-09 ENCOUNTER — Encounter (HOSPITAL_COMMUNITY): Payer: Commercial Managed Care - HMO

## 2014-12-13 ENCOUNTER — Ambulatory Visit (HOSPITAL_COMMUNITY): Payer: Commercial Managed Care - HMO | Attending: Internal Medicine

## 2014-12-13 DIAGNOSIS — Z951 Presence of aortocoronary bypass graft: Secondary | ICD-10-CM | POA: Diagnosis not present

## 2014-12-13 DIAGNOSIS — R0602 Shortness of breath: Secondary | ICD-10-CM | POA: Insufficient documentation

## 2014-12-13 DIAGNOSIS — I493 Ventricular premature depolarization: Secondary | ICD-10-CM | POA: Diagnosis not present

## 2014-12-13 DIAGNOSIS — I10 Essential (primary) hypertension: Secondary | ICD-10-CM | POA: Insufficient documentation

## 2014-12-13 DIAGNOSIS — I2510 Atherosclerotic heart disease of native coronary artery without angina pectoris: Secondary | ICD-10-CM | POA: Diagnosis not present

## 2014-12-13 DIAGNOSIS — R9439 Abnormal result of other cardiovascular function study: Secondary | ICD-10-CM | POA: Insufficient documentation

## 2014-12-13 DIAGNOSIS — I4510 Unspecified right bundle-branch block: Secondary | ICD-10-CM | POA: Diagnosis not present

## 2014-12-13 DIAGNOSIS — R079 Chest pain, unspecified: Secondary | ICD-10-CM | POA: Insufficient documentation

## 2014-12-13 DIAGNOSIS — E119 Type 2 diabetes mellitus without complications: Secondary | ICD-10-CM | POA: Diagnosis not present

## 2014-12-13 LAB — MYOCARDIAL PERFUSION IMAGING
CHL CUP NUCLEAR SSS: 12
CSEPPHR: 111 {beats}/min
RATE: 0.38
Rest HR: 83 {beats}/min
SDS: 2
SRS: 11
TID: 0.82

## 2014-12-13 MED ORDER — REGADENOSON 0.4 MG/5ML IV SOLN
0.4000 mg | Freq: Once | INTRAVENOUS | Status: AC
  Administered 2014-12-13: 0.4 mg via INTRAVENOUS

## 2014-12-13 MED ORDER — TECHNETIUM TC 99M SESTAMIBI GENERIC - CARDIOLITE
10.2000 | Freq: Once | INTRAVENOUS | Status: AC | PRN
  Administered 2014-12-13: 10 via INTRAVENOUS

## 2014-12-13 MED ORDER — TECHNETIUM TC 99M SESTAMIBI GENERIC - CARDIOLITE
31.3000 | Freq: Once | INTRAVENOUS | Status: AC | PRN
  Administered 2014-12-13: 31.3 via INTRAVENOUS

## 2014-12-15 ENCOUNTER — Ambulatory Visit (INDEPENDENT_AMBULATORY_CARE_PROVIDER_SITE_OTHER): Payer: Commercial Managed Care - HMO

## 2014-12-15 DIAGNOSIS — I2699 Other pulmonary embolism without acute cor pulmonale: Secondary | ICD-10-CM | POA: Diagnosis not present

## 2014-12-15 DIAGNOSIS — Z5181 Encounter for therapeutic drug level monitoring: Secondary | ICD-10-CM

## 2014-12-15 DIAGNOSIS — I2692 Saddle embolus of pulmonary artery without acute cor pulmonale: Secondary | ICD-10-CM

## 2014-12-15 DIAGNOSIS — I2510 Atherosclerotic heart disease of native coronary artery without angina pectoris: Secondary | ICD-10-CM | POA: Diagnosis not present

## 2014-12-15 LAB — POCT INR: INR: 2.2

## 2014-12-27 ENCOUNTER — Ambulatory Visit (INDEPENDENT_AMBULATORY_CARE_PROVIDER_SITE_OTHER): Payer: Commercial Managed Care - HMO | Admitting: Cardiovascular Disease

## 2014-12-27 ENCOUNTER — Encounter: Payer: Self-pay | Admitting: Cardiovascular Disease

## 2014-12-27 VITALS — BP 110/64 | HR 49 | Ht 66.0 in | Wt 172.0 lb

## 2014-12-27 DIAGNOSIS — I2699 Other pulmonary embolism without acute cor pulmonale: Secondary | ICD-10-CM | POA: Diagnosis not present

## 2014-12-27 DIAGNOSIS — I6523 Occlusion and stenosis of bilateral carotid arteries: Secondary | ICD-10-CM

## 2014-12-27 DIAGNOSIS — I2692 Saddle embolus of pulmonary artery without acute cor pulmonale: Secondary | ICD-10-CM

## 2014-12-27 DIAGNOSIS — I2510 Atherosclerotic heart disease of native coronary artery without angina pectoris: Secondary | ICD-10-CM | POA: Diagnosis not present

## 2014-12-27 NOTE — Progress Notes (Signed)
Chief Complaint  Patient presents with  . Cough    History of Present Illness: 74 yo female with history of CAD s/p CABG, PE, carotid artery disease who is here today for cardiac follow up. She was admitted 12/28-1/22/13 with symptoms of pancreatitis. Diagnosed with cholelithiasis and underwent ERCP with sphincterotomy and stone extraction. Following this, she developed pulmonary edema and ruled in for an NSTEMI. Stress testing was undertaken and demonstrated an EF 20% and situs inversus which limited interpretation of the study. LHC was performed 06/01/11. This confirmed dextrocardia. 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 EF was 40-45% by cardiac catheterization. Echocardiogram 06/05/11: EF 40-45%, anteroseptal hypokinesis, apical hypokinesis, mild LAE. CT scan was recommended and performed 06/12/11. This demonstrated situs inversus totalis. What would typically be called RCA arose from anterior sinus of Valsalva. What would typically be called the left main arises from the posterior sinus of Valsalva and gives rise to a large first diagonal branch and diminutive circumflex. She was then seen by Dr. Laneta Simmers of [facility]. CABG was recommended. Gen. Surgery felt that cholecystectomy could be done in the future as cardiac risk outweighed further intervention for her gallbladder. CABG was performed 06/14/11: RIMA-LAD, SVG-ramus, SVG-RCA. Pre-CABG Dopplers 06/12/11: LICA 60-79%. I saw her in March 2013 and she was c/o dyspnea. I discussed a CTA chest to exclude PE and a CXR but she refused. She was admitted March 10,2013 to [facility] with right-sided pleuritic chest pain and dyspnea. She underwent CT scanning which showed a large saddle embolus. Echocardiogram showed right heart strain and a possible mass in the left atrium as well as moderate sized pericardial effusion, LVEF 50-55%. She did not meet criteria for  thrombolysis. Bilateral lower extremity dopplers were positive for subacute DVT noted throughout the right lower extremity. There was acute, occlusive DVT noted in the posterior tibial vein of the left lower extremity. She was started on heparin and coumadin. She continued to have high O2 requirements and decision was made to place IVC filter. Hospital course was complicated by volume overload in setting of RV strain secondary to PE / diastolic failure. She was aggressively diuresed to achieve negative balance. 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 Repeat carotid dopplers 06/03/14 40-59% RICA stenosis, stable 60-79% LICA stenosis. Repeat echo 04/22/12 with normal LV size and function with mild valve disease and no pericardial effusion. She had her cholecystectomy in December 2013. Admitted to [facility] December 2014 with chest pain and she was treated for pneumonia.  She has recently been treated for bronchitis and had been on prednisone and antibiotics when I saw her 11/26/14. She also c/o chest pain and SOB. Stress myoview 12/13/14 with no ischemia, small apical fixed defect.   She is here today for cardiac follow up.  She is feeling much better. No chest pain. Breathing has improved.   Primary Care Physician: Elias Else  Last Lipid Profile:Lipid Panel Most recent in Dr. Benjaman Pott office January 2014, controlled per pt.   Past Medical History  Diagnosis Date  . Arthritis   . Neuropathy   . GERD (gastroesophageal reflux disease)   . Hypertension   . HLD (hyperlipidemia)   . Situs inversus with dextrocardia     CT 06/2011: situs inversus totalis.  What would typically be called RCA arose from anterior sinus of  Valsalva.  What would typically be called the left main arises from the posterior sinus of Valsalva and gives rise to a large first diagonal branch and diminutive circumflex  . Dextrocardia   . Fracture  05/24/2011    right; "did not have surgery"  . DVT of leg (deep venous thrombosis) ~2006    left  . Pneumonia 06/13/11    "couple times; long time ago"  . CAD (coronary artery disease)     NSTEMI in setting of gallstone pancreatitis 05/2011:  LHC with 3v CAD; CABG was performed 06/14/11: RIMA-LAD, SVG-ramus, SVG-RCA  . Ischemic cardiomyopathy     Echocardiogram 06/05/11: EF 40-45%, anteroseptal hypokinesis, apical hypokinesis, mild LAE  . Chronic bronchitis   . Anemia   . H/O hiatal hernia   . Chronic systolic heart failure   . Carotid stenosis     Dopplers 06/12/11: LICA 60-79%.  . Pulmonary embolus March 2013    Past Surgical History  Procedure Laterality Date  . Ercp  06/03/2011    Procedure: ENDOSCOPIC RETROGRADE CHOLANGIOPANCREATOGRAPHY (ERCP);  Surgeon: Petra Kuba, MD;  Location: [facility] OR;  Service: Endoscopy;  Laterality: N/A;  . Vaginal hysterectomy  1970's  . Cardiac catheterization  06/11/11  . Coronary artery bypass graft  06/14/2011    Procedure: CORONARY ARTERY BYPASS GRAFTING (CABG);  Surgeon: Alleen Borne, MD;  Location: [facility] OR;  Service: Open Heart Surgery;  Laterality: N/A;  . Left heart catheterization with coronary angiogram N/A 06/11/2011    Procedure: LEFT HEART CATHETERIZATION WITH CORONARY ANGIOGRAM;  Surgeon: Kathleene Hazel, MD;  Location: [facility] CATH LAB;  Service: Cardiovascular;  Laterality: N/A;  . Arch aortogram Right 06/11/2011    Procedure: ARCH AORTOGRAM;  Surgeon: Kathleene Hazel, MD;  Location: [facility] CATH LAB;  Service: Cardiovascular;  Laterality: Right;    Current Outpatient Prescriptions  Medication Sig Dispense Refill  . alendronate (FOSAMAX) 70 MG tablet Take 70 mg by mouth once a week. Take with a full glass of water on an empty stomach.    Marland Kitchen allopurinol (ZYLOPRIM) 100 MG tablet Take 200 mg by mouth daily.      Marland Kitchen aspirin 325 MG tablet Take 325 mg by mouth daily.      Marland Kitchen FOLIC ACID PO Take 10 mg by mouth daily.    . furosemide (LASIX) 40 MG  tablet Take 40 mg by mouth daily.    Marland Kitchen gabapentin (NEURONTIN) 300 MG capsule Take 300 mg by mouth 2 (two) times daily.      . Loratadine 10 MG CAPS Take 10 mg by mouth daily.     . Magnesium 500 MG CAPS Take 1 capsule by mouth daily. PRN    . metFORMIN (GLUCOPHAGE-XR) 500 MG 24 hr tablet Take 500 mg by mouth 2 (two) times a week. Takes twice a week    . methotrexate (RHEUMATREX) 2.5 MG tablet Take 2.5 mg by mouth daily.    . metoprolol tartrate (LOPRESSOR) 25 MG tablet Take 1 tablet (25 mg total) by mouth 2 (two) times daily. 180 tablet 3  . omeprazole (PRILOSEC) 20 MG capsule Take 20 mg by mouth every morning.      . simvastatin (ZOCOR) 20 MG tablet Take 20 mg by mouth daily.    . Vitamins A & D (VITAMIN A & D) 10000-400 UNITS CAPS Take 4,000 Units by mouth daily.     Marland Kitchen warfarin (COUMADIN) 2 MG tablet Take as directed by coumadin clinic 65 tablet 3   No current facility-administered  medications for this visit.    Allergies  Allergen Reactions  . Penicillins Anaphylaxis  . Shellfish Allergy Anaphylaxis    History   Social History  . Marital Status: Married    Spouse Name: N/A  . Number of Children: N/A  . Years of Education: N/A   Occupational History  . Not on file.   Social History Main Topics  . Smoking status: Never Smoker   . Smokeless tobacco: Never Used  . Alcohol Use: No  . Drug Use: No  . Sexual Activity: No   Other Topics Concern  . Not on file   Social History Narrative    No family history on file.  Review of Systems:  As stated in the HPI and otherwise negative.   BP 110/64 mmHg  Pulse 49  Ht 5\' 6"  (1.676 m)  Wt 172 lb (78.019 kg)  BMI 27.77 kg/m2  Physical Examination: General: Well developed, well nourished, NAD HEENT: OP clear, mucus membranes moist SKIN: warm, dry. No rashes. Neuro: No focal deficits Musculoskeletal: Muscle strength 5/5 all ext Psychiatric: Mood and affect normal Neck: No JVD, no carotid bruits, no thyromegaly, no  lymphadenopathy. Lungs:Clear bilaterally, no wheezes, rhonci, crackles Cardiovascular: Regular rate and rhythm. No murmurs, gallops or rubs. Abdomen:Soft. Bowel sounds present. Non-tender.  Extremities: No lower extremity edema. Pulses are 2 + in the bilateral DP/PT.  Carotid artery dopplers: 06/03/14 40-59% RICA stenosis, 60-79% LICA stenosis.   Echo 04/22/12: Left ventricle: The cavity size was normal. Wall thickness was normal. Systolic function was normal. The estimated ejection fraction was in the range of 55% to 60%. Wall motion was normal; there were no regional wall motion abnormalities. Doppler parameters are consistent with abnormal left ventricular relaxation (grade 1 diastolic dysfunction). - Aortic valve: There was no stenosis. - Mitral valve: Mild regurgitation. - Right ventricle: The cavity size was normal. Systolic function was normal. - Tricuspid valve: Peak RV-RA gradient: 23mm Hg (S). - Pulmonary arteries: PA peak pressure: 60mm Hg (S). - Inferior vena cava: The vessel was normal in size; the respirophasic diameter changes were in the normal range (= 50%); findings are consistent with normal central venous pressure. - Pericardium, extracardiac: There was no pericardial effusion. Impressions:  - Normal LV size and systolic function, EF 55-60%. Normal RV size and systolic function. Mild mitral regurgitation.  EKG:  EKG is not ordered today. The ekg ordered today demonstrates  Recent Labs: No results found for requested labs within last 365 days.   Lipid Panel    Component Value Date/Time   CHOL 124 06/14/2011 0400   TRIG 91 06/14/2011 0400   HDL 36* 06/14/2011 0400   CHOLHDL 3.4 06/14/2011 0400   VLDL 18 06/14/2011 0400   LDLCALC 70 06/14/2011 0400     Wt Readings from Last 3 Encounters:  12/27/14 172 lb (78.019 kg)  12/13/14 175 lb (79.379 kg)  11/26/14 175 lb (79.379 kg)     Other studies Reviewed: Additional studies/ records that were  reviewed today include: . Review of the above records demonstrates:    Assessment and Plan:   1. Saddle embolus of pulmonary artery/CVT: She is on coumadin therapy and will need this for lifetime.   2. Carotid stenosis: Stable 06/03/14. Known 60-79% LICA stenosis. Repeat December 2016.   3. CAD: She is s/p bypass. Stress myoview June 2016 with no ischemia. Continue ASA, statin and beta blocker.    4. 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 Continue coumadin.  5. Pericardial effusion: Resolved on echo 04/22/12.    6. Dizziness: Resolved with lowering metoprolol to 25 mg po BID.   7. Ischemic cardiomyopathy/Chronic systolic CHF: Weight stable. LE edema stable. Continue current meds. She is on daily lasix. Continue beta blocker. No ace-inh with hypotension.   Current medicines are reviewed at length with the patient today.  The patient does not have concerns regarding medicines.  The following changes have been made:  Changed metoprolol  Labs/ tests ordered today include:   No orders of the defined types were placed in this encounter.    Disposition:   FU with me  in 4 weeks  Signed, Verne Carrow, MD 12/27/2014 10:03 AM    [facility] [address] [place], [place], [place]  [address] Phone: [phone]; Fax: [phone]

## 2014-12-27 NOTE — Patient Instructions (Addendum)
Medication Instructions:  Your physician recommends that you continue on your current medications as directed. Please refer to the Current Medication list given to you today.   Labwork: none  Testing/Procedures:  Your physician has requested that you have a carotid duplex. This test is an ultrasound of the carotid arteries in your neck. It looks at blood flow through these arteries that supply the brain with blood. Allow one hour for this exam. There are no restrictions or special instructions. To be done in early January 2017    Follow-Up: Your physician wants you to follow-up in:  6 months.  You will receive a reminder letter in the mail two months in advance. If you don't receive a letter, please call our office to schedule the follow-up appointment.

## 2015-01-19 ENCOUNTER — Ambulatory Visit (INDEPENDENT_AMBULATORY_CARE_PROVIDER_SITE_OTHER): Payer: Commercial Managed Care - HMO

## 2015-01-19 DIAGNOSIS — I2699 Other pulmonary embolism without acute cor pulmonale: Secondary | ICD-10-CM | POA: Diagnosis not present

## 2015-01-19 DIAGNOSIS — Z5181 Encounter for therapeutic drug level monitoring: Secondary | ICD-10-CM | POA: Diagnosis not present

## 2015-01-19 DIAGNOSIS — I2510 Atherosclerotic heart disease of native coronary artery without angina pectoris: Secondary | ICD-10-CM | POA: Diagnosis not present

## 2015-01-19 DIAGNOSIS — I2692 Saddle embolus of pulmonary artery without acute cor pulmonale: Secondary | ICD-10-CM

## 2015-01-19 LAB — POCT INR: INR: 2.1

## 2015-01-26 DIAGNOSIS — M7060 Trochanteric bursitis, unspecified hip: Secondary | ICD-10-CM | POA: Diagnosis not present

## 2015-01-26 DIAGNOSIS — M059 Rheumatoid arthritis with rheumatoid factor, unspecified: Secondary | ICD-10-CM | POA: Diagnosis not present

## 2015-01-26 DIAGNOSIS — M2550 Pain in unspecified joint: Secondary | ICD-10-CM | POA: Diagnosis not present

## 2015-01-26 DIAGNOSIS — Z79899 Other long term (current) drug therapy: Secondary | ICD-10-CM | POA: Diagnosis not present

## 2015-01-26 DIAGNOSIS — M1A09X0 Idiopathic chronic gout, multiple sites, without tophus (tophi): Secondary | ICD-10-CM | POA: Diagnosis not present

## 2015-03-02 ENCOUNTER — Ambulatory Visit (INDEPENDENT_AMBULATORY_CARE_PROVIDER_SITE_OTHER): Payer: Commercial Managed Care - HMO

## 2015-03-02 DIAGNOSIS — I2699 Other pulmonary embolism without acute cor pulmonale: Secondary | ICD-10-CM | POA: Diagnosis not present

## 2015-03-02 DIAGNOSIS — I2692 Saddle embolus of pulmonary artery without acute cor pulmonale: Secondary | ICD-10-CM

## 2015-03-02 DIAGNOSIS — Z5181 Encounter for therapeutic drug level monitoring: Secondary | ICD-10-CM | POA: Diagnosis not present

## 2015-03-02 DIAGNOSIS — I2510 Atherosclerotic heart disease of native coronary artery without angina pectoris: Secondary | ICD-10-CM | POA: Diagnosis not present

## 2015-03-02 LAB — POCT INR: INR: 1.8

## 2015-03-30 ENCOUNTER — Ambulatory Visit (INDEPENDENT_AMBULATORY_CARE_PROVIDER_SITE_OTHER): Payer: Commercial Managed Care - HMO

## 2015-03-30 DIAGNOSIS — Z5181 Encounter for therapeutic drug level monitoring: Secondary | ICD-10-CM

## 2015-03-30 DIAGNOSIS — I2510 Atherosclerotic heart disease of native coronary artery without angina pectoris: Secondary | ICD-10-CM

## 2015-03-30 DIAGNOSIS — I2692 Saddle embolus of pulmonary artery without acute cor pulmonale: Secondary | ICD-10-CM | POA: Diagnosis not present

## 2015-03-30 LAB — POCT INR: INR: 1.8

## 2015-04-20 ENCOUNTER — Ambulatory Visit (INDEPENDENT_AMBULATORY_CARE_PROVIDER_SITE_OTHER): Payer: Commercial Managed Care - HMO

## 2015-04-20 DIAGNOSIS — Z5181 Encounter for therapeutic drug level monitoring: Secondary | ICD-10-CM | POA: Diagnosis not present

## 2015-04-20 DIAGNOSIS — I2692 Saddle embolus of pulmonary artery without acute cor pulmonale: Secondary | ICD-10-CM | POA: Diagnosis not present

## 2015-04-20 DIAGNOSIS — I2510 Atherosclerotic heart disease of native coronary artery without angina pectoris: Secondary | ICD-10-CM | POA: Diagnosis not present

## 2015-04-20 LAB — POCT INR: INR: 2.5

## 2015-04-20 MED ORDER — WARFARIN SODIUM 2 MG PO TABS: ORAL_TABLET | ORAL | Status: DC

## 2015-05-02 DIAGNOSIS — Z1389 Encounter for screening for other disorder: Secondary | ICD-10-CM | POA: Diagnosis not present

## 2015-05-02 DIAGNOSIS — E782 Mixed hyperlipidemia: Secondary | ICD-10-CM | POA: Diagnosis not present

## 2015-05-02 DIAGNOSIS — I10 Essential (primary) hypertension: Secondary | ICD-10-CM | POA: Diagnosis not present

## 2015-05-02 DIAGNOSIS — Q240 Dextrocardia: Secondary | ICD-10-CM | POA: Diagnosis not present

## 2015-05-02 DIAGNOSIS — Z0000 Encounter for general adult medical examination without abnormal findings: Secondary | ICD-10-CM | POA: Diagnosis not present

## 2015-05-02 DIAGNOSIS — M810 Age-related osteoporosis without current pathological fracture: Secondary | ICD-10-CM | POA: Diagnosis not present

## 2015-05-02 DIAGNOSIS — E0842 Diabetes mellitus due to underlying condition with diabetic polyneuropathy: Secondary | ICD-10-CM | POA: Diagnosis not present

## 2015-05-02 DIAGNOSIS — E1142 Type 2 diabetes mellitus with diabetic polyneuropathy: Secondary | ICD-10-CM | POA: Diagnosis not present

## 2015-05-03 ENCOUNTER — Other Ambulatory Visit: Payer: Self-pay | Admitting: Family Medicine

## 2015-05-03 DIAGNOSIS — Z1231 Encounter for screening mammogram for malignant neoplasm of breast: Secondary | ICD-10-CM

## 2015-05-18 ENCOUNTER — Ambulatory Visit (INDEPENDENT_AMBULATORY_CARE_PROVIDER_SITE_OTHER): Payer: Commercial Managed Care - HMO

## 2015-05-18 DIAGNOSIS — I2510 Atherosclerotic heart disease of native coronary artery without angina pectoris: Secondary | ICD-10-CM

## 2015-05-18 DIAGNOSIS — Z5181 Encounter for therapeutic drug level monitoring: Secondary | ICD-10-CM | POA: Diagnosis not present

## 2015-05-18 DIAGNOSIS — I2692 Saddle embolus of pulmonary artery without acute cor pulmonale: Secondary | ICD-10-CM | POA: Diagnosis not present

## 2015-05-18 LAB — POCT INR: INR: 2.3

## 2015-05-25 ENCOUNTER — Encounter: Payer: Self-pay | Admitting: Emergency Medicine

## 2015-05-25 ENCOUNTER — Observation Stay
Admission: EM | Admit: 2015-05-25 | Discharge: 2015-05-27 | Disposition: A | Discharge status: Home / Self Care | Payer: Commercial Managed Care - HMO | Attending: Internal Medicine | Admitting: Internal Medicine

## 2015-05-25 ENCOUNTER — Emergency Department: Payer: Commercial Managed Care - HMO

## 2015-05-25 DIAGNOSIS — I255 Ischemic cardiomyopathy: Secondary | ICD-10-CM | POA: Insufficient documentation

## 2015-05-25 DIAGNOSIS — Q240 Dextrocardia: Secondary | ICD-10-CM | POA: Diagnosis not present

## 2015-05-25 DIAGNOSIS — E876 Hypokalemia: Secondary | ICD-10-CM | POA: Insufficient documentation

## 2015-05-25 DIAGNOSIS — R42 Dizziness and giddiness: Secondary | ICD-10-CM | POA: Insufficient documentation

## 2015-05-25 DIAGNOSIS — Z79899 Other long term (current) drug therapy: Secondary | ICD-10-CM | POA: Insufficient documentation

## 2015-05-25 DIAGNOSIS — E869 Volume depletion, unspecified: Secondary | ICD-10-CM | POA: Insufficient documentation

## 2015-05-25 DIAGNOSIS — I10 Essential (primary) hypertension: Secondary | ICD-10-CM | POA: Diagnosis not present

## 2015-05-25 DIAGNOSIS — I2692 Saddle embolus of pulmonary artery without acute cor pulmonale: Secondary | ICD-10-CM | POA: Insufficient documentation

## 2015-05-25 DIAGNOSIS — R531 Weakness: Secondary | ICD-10-CM | POA: Diagnosis not present

## 2015-05-25 DIAGNOSIS — Z86718 Personal history of other venous thrombosis and embolism: Secondary | ICD-10-CM | POA: Diagnosis not present

## 2015-05-25 DIAGNOSIS — R112 Nausea with vomiting, unspecified: Secondary | ICD-10-CM | POA: Insufficient documentation

## 2015-05-25 DIAGNOSIS — Z951 Presence of aortocoronary bypass graft: Secondary | ICD-10-CM | POA: Diagnosis not present

## 2015-05-25 DIAGNOSIS — K219 Gastro-esophageal reflux disease without esophagitis: Secondary | ICD-10-CM | POA: Insufficient documentation

## 2015-05-25 DIAGNOSIS — Z86711 Personal history of pulmonary embolism: Secondary | ICD-10-CM | POA: Insufficient documentation

## 2015-05-25 DIAGNOSIS — G249 Dystonia, unspecified: Secondary | ICD-10-CM | POA: Insufficient documentation

## 2015-05-25 DIAGNOSIS — Z7982 Long term (current) use of aspirin: Secondary | ICD-10-CM | POA: Insufficient documentation

## 2015-05-25 DIAGNOSIS — I6529 Occlusion and stenosis of unspecified carotid artery: Secondary | ICD-10-CM | POA: Insufficient documentation

## 2015-05-25 DIAGNOSIS — R197 Diarrhea, unspecified: Secondary | ICD-10-CM | POA: Diagnosis not present

## 2015-05-25 DIAGNOSIS — E119 Type 2 diabetes mellitus without complications: Secondary | ICD-10-CM | POA: Insufficient documentation

## 2015-05-25 DIAGNOSIS — R1110 Vomiting, unspecified: Secondary | ICD-10-CM | POA: Diagnosis not present

## 2015-05-25 DIAGNOSIS — I2510 Atherosclerotic heart disease of native coronary artery without angina pectoris: Secondary | ICD-10-CM | POA: Diagnosis not present

## 2015-05-25 DIAGNOSIS — I252 Old myocardial infarction: Secondary | ICD-10-CM | POA: Insufficient documentation

## 2015-05-25 DIAGNOSIS — I313 Pericardial effusion (noninflammatory): Secondary | ICD-10-CM | POA: Diagnosis not present

## 2015-05-25 DIAGNOSIS — Z7901 Long term (current) use of anticoagulants: Secondary | ICD-10-CM | POA: Diagnosis not present

## 2015-05-25 DIAGNOSIS — I5022 Chronic systolic (congestive) heart failure: Secondary | ICD-10-CM | POA: Diagnosis not present

## 2015-05-25 DIAGNOSIS — Z880 Allergy status to penicillin: Secondary | ICD-10-CM | POA: Diagnosis not present

## 2015-05-25 DIAGNOSIS — E785 Hyperlipidemia, unspecified: Secondary | ICD-10-CM | POA: Insufficient documentation

## 2015-05-25 DIAGNOSIS — Z91013 Allergy to seafood: Secondary | ICD-10-CM | POA: Insufficient documentation

## 2015-05-25 DIAGNOSIS — R1031 Right lower quadrant pain: Secondary | ICD-10-CM | POA: Diagnosis not present

## 2015-05-25 DIAGNOSIS — J189 Pneumonia, unspecified organism: Principal | ICD-10-CM | POA: Diagnosis present

## 2015-05-25 HISTORY — DX: Systemic involvement of connective tissue, unspecified: M35.9

## 2015-05-25 HISTORY — DX: Heart failure, unspecified: I50.9

## 2015-05-25 LAB — CBC WITH DIFFERENTIAL/PLATELET
BASOS ABS: 0.1 10*3/uL (ref 0–0.1)
Basophils Relative: 1 %
EOS ABS: 0 10*3/uL (ref 0–0.7)
EOS PCT: 0 %
HCT: 38.3 % (ref 35.0–47.0)
Hemoglobin: 12.5 g/dL (ref 12.0–16.0)
LYMPHS ABS: 2.1 10*3/uL (ref 1.0–3.6)
Lymphocytes Relative: 24 %
MCH: 32.5 pg (ref 26.0–34.0)
MCHC: 32.5 g/dL (ref 32.0–36.0)
MCV: 99.9 fL (ref 80.0–100.0)
MONO ABS: 0.5 10*3/uL (ref 0.2–0.9)
Monocytes Relative: 6 %
Neutro Abs: 6.1 10*3/uL (ref 1.4–6.5)
Neutrophils Relative %: 69 %
PLATELETS: 237 10*3/uL (ref 150–440)
RBC: 3.84 MIL/uL (ref 3.80–5.20)
RDW: 18.1 % — AB (ref 11.5–14.5)
WBC: 8.8 10*3/uL (ref 3.6–11.0)

## 2015-05-25 LAB — URINALYSIS COMPLETE WITH MICROSCOPIC (ARMC ONLY)
BILIRUBIN URINE: NEGATIVE
Glucose, UA: NEGATIVE mg/dL
HGB URINE DIPSTICK: NEGATIVE
LEUKOCYTES UA: NEGATIVE
NITRITE: NEGATIVE
PH: 8 (ref 5.0–8.0)
PROTEIN: 30 mg/dL — AB
SPECIFIC GRAVITY, URINE: 1.006 (ref 1.005–1.030)

## 2015-05-25 LAB — COMPREHENSIVE METABOLIC PANEL
ALT: 14 U/L (ref 14–54)
AST: 20 U/L (ref 15–41)
Albumin: 3.5 g/dL (ref 3.5–5.0)
Alkaline Phosphatase: 91 U/L (ref 38–126)
Anion gap: 10 (ref 5–15)
BUN: 16 mg/dL (ref 6–20)
CHLORIDE: 102 mmol/L (ref 101–111)
CO2: 28 mmol/L (ref 22–32)
CREATININE: 1.13 mg/dL — AB (ref 0.44–1.00)
Calcium: 9 mg/dL (ref 8.9–10.3)
GFR, EST AFRICAN AMERICAN: 55 mL/min — AB (ref >60)
GFR, EST NON AFRICAN AMERICAN: 47 mL/min — AB (ref >60)
Glucose, Bld: 127 mg/dL — ABNORMAL HIGH (ref 65–99)
POTASSIUM: 3.1 mmol/L — AB (ref 3.5–5.1)
SODIUM: 140 mmol/L (ref 135–145)
Total Bilirubin: 0.8 mg/dL (ref 0.3–1.2)
Total Protein: 7 g/dL (ref 6.5–8.1)

## 2015-05-25 LAB — TROPONIN I

## 2015-05-25 LAB — LIPASE, BLOOD: LIPASE: 19 U/L (ref 11–51)

## 2015-05-25 LAB — PROTIME-INR
INR: 2.02
Prothrombin Time: 22.7 seconds — ABNORMAL HIGH (ref 11.4–15.0)

## 2015-05-25 LAB — APTT: APTT: 32 s (ref 24–36)

## 2015-05-25 MED ORDER — ACETAMINOPHEN 325 MG PO TABS: 650.0000 mg | ORAL_TABLET | Freq: Four times a day (QID) | ORAL | Status: DC | PRN

## 2015-05-25 MED ORDER — WARFARIN SODIUM 1 MG PO TABS
4.0000 mg | ORAL_TABLET | Freq: Every evening | ORAL | Status: DC
  Administered 2015-05-25: 4 mg via ORAL
  Filled 2015-05-25: qty 4

## 2015-05-25 MED ORDER — SODIUM CHLORIDE 0.9 % IV BOLUS (SEPSIS)
1000.0000 mL | Freq: Once | INTRAVENOUS | Status: AC
  Administered 2015-05-25: 1000 mL via INTRAVENOUS

## 2015-05-25 MED ORDER — WARFARIN - PHYSICIAN DOSING INPATIENT
Freq: Every day | Status: DC
  Administered 2015-05-25: 18:00:00

## 2015-05-25 MED ORDER — ALLOPURINOL 100 MG PO TABS
100.0000 mg | ORAL_TABLET | Freq: Two times a day (BID) | ORAL | Status: DC
  Administered 2015-05-25 – 2015-05-27 (×5): 100 mg via ORAL
  Filled 2015-05-25 (×5): qty 1

## 2015-05-25 MED ORDER — PANTOPRAZOLE SODIUM 40 MG PO TBEC
40.0000 mg | DELAYED_RELEASE_TABLET | Freq: Every day | ORAL | Status: DC
  Administered 2015-05-25 – 2015-05-27 (×3): 40 mg via ORAL
  Filled 2015-05-25 (×3): qty 1

## 2015-05-25 MED ORDER — SIMVASTATIN 20 MG PO TABS
20.0000 mg | ORAL_TABLET | Freq: Every evening | ORAL | Status: DC
  Administered 2015-05-25 – 2015-05-26 (×2): 20 mg via ORAL
  Filled 2015-05-25 (×2): qty 1

## 2015-05-25 MED ORDER — ACETAMINOPHEN 650 MG RE SUPP: 650.0000 mg | Freq: Four times a day (QID) | RECTAL | Status: DC | PRN

## 2015-05-25 MED ORDER — IOHEXOL 300 MG/ML  SOLN
85.0000 mL | Freq: Once | INTRAMUSCULAR | Status: AC | PRN
  Administered 2015-05-25: 85 mL via INTRAVENOUS

## 2015-05-25 MED ORDER — FUROSEMIDE 40 MG PO TABS
40.0000 mg | ORAL_TABLET | Freq: Every day | ORAL | Status: DC
Start: 2015-05-25 — End: 2015-05-26
  Administered 2015-05-25 – 2015-05-26 (×2): 40 mg via ORAL
  Filled 2015-05-25 (×2): qty 1

## 2015-05-25 MED ORDER — ASPIRIN EC 81 MG PO TBEC
81.0000 mg | DELAYED_RELEASE_TABLET | Freq: Every day | ORAL | Status: DC
  Administered 2015-05-25 – 2015-05-27 (×3): 81 mg via ORAL
  Filled 2015-05-25 (×3): qty 1

## 2015-05-25 MED ORDER — ALENDRONATE SODIUM 70 MG PO TABS: 70.0000 mg | ORAL_TABLET | ORAL | Status: DC

## 2015-05-25 MED ORDER — ONDANSETRON HCL 4 MG PO TABS: 4.0000 mg | ORAL_TABLET | Freq: Four times a day (QID) | ORAL | Status: DC | PRN

## 2015-05-25 MED ORDER — ALUM & MAG HYDROXIDE-SIMETH 200-200-20 MG/5ML PO SUSP: 30.0000 mL | Freq: Four times a day (QID) | ORAL | Status: DC | PRN

## 2015-05-25 MED ORDER — ALPRAZOLAM 0.5 MG PO TABS: 0.5000 mg | ORAL_TABLET | Freq: Two times a day (BID) | ORAL | Status: DC | PRN

## 2015-05-25 MED ORDER — SENNOSIDES-DOCUSATE SODIUM 8.6-50 MG PO TABS: 1.0000 | ORAL_TABLET | Freq: Every evening | ORAL | Status: DC | PRN

## 2015-05-25 MED ORDER — HYDROCODONE-ACETAMINOPHEN 5-325 MG PO TABS: 1.0000 | ORAL_TABLET | ORAL | Status: DC | PRN

## 2015-05-25 MED ORDER — SODIUM CHLORIDE 0.9 % IV SOLN
INTRAVENOUS | Status: DC
  Administered 2015-05-25 – 2015-05-27 (×3): via INTRAVENOUS

## 2015-05-25 MED ORDER — GABAPENTIN 300 MG PO CAPS
300.0000 mg | ORAL_CAPSULE | Freq: Two times a day (BID) | ORAL | Status: DC
  Administered 2015-05-25 – 2015-05-27 (×5): 300 mg via ORAL
  Filled 2015-05-25 (×5): qty 1

## 2015-05-25 MED ORDER — METOPROLOL TARTRATE 25 MG PO TABS
25.0000 mg | ORAL_TABLET | Freq: Two times a day (BID) | ORAL | Status: DC
  Administered 2015-05-25 (×2): 25 mg via ORAL
  Filled 2015-05-25 (×3): qty 1

## 2015-05-25 MED ORDER — VITAMIN D 1000 UNITS PO TABS
1000.0000 [IU] | ORAL_TABLET | Freq: Every day | ORAL | Status: DC
  Administered 2015-05-25 – 2015-05-27 (×3): 1000 [IU] via ORAL
  Filled 2015-05-25 (×3): qty 1

## 2015-05-25 MED ORDER — FOLIC ACID 1 MG PO TABS
500.0000 ug | ORAL_TABLET | Freq: Every day | ORAL | Status: DC
  Administered 2015-05-25 – 2015-05-27 (×3): 0.5 mg via ORAL
  Filled 2015-05-25 (×3): qty 1

## 2015-05-25 MED ORDER — METHOTREXATE 2.5 MG PO TABS: 22.5000 mg | ORAL_TABLET | ORAL | Status: DC

## 2015-05-25 MED ORDER — IOHEXOL 240 MG/ML SOLN
25.0000 mL | Freq: Once | INTRAMUSCULAR | Status: AC | PRN
  Administered 2015-05-25: 25 mL via INTRAVENOUS

## 2015-05-25 MED ORDER — LEVOFLOXACIN IN D5W 750 MG/150ML IV SOLN: 750.0000 mg | INTRAVENOUS | Status: DC

## 2015-05-25 MED ORDER — LEVOFLOXACIN IN D5W 750 MG/150ML IV SOLN
750.0000 mg | Freq: Once | INTRAVENOUS | Status: AC
  Administered 2015-05-25: 750 mg via INTRAVENOUS
  Filled 2015-05-25: qty 150

## 2015-05-25 MED ORDER — ONDANSETRON HCL 4 MG/2ML IJ SOLN
4.0000 mg | Freq: Four times a day (QID) | INTRAMUSCULAR | Status: DC | PRN
  Administered 2015-05-25 – 2015-05-27 (×7): 4 mg via INTRAVENOUS
  Filled 2015-05-25 (×7): qty 2

## 2015-05-25 MED ORDER — ONDANSETRON HCL 4 MG/2ML IJ SOLN
4.0000 mg | Freq: Once | INTRAMUSCULAR | Status: AC
  Administered 2015-05-25: 4 mg via INTRAVENOUS
  Filled 2015-05-25: qty 2

## 2015-05-25 NOTE — ED Notes (Signed)
Pt returned to CT. 

## 2015-05-25 NOTE — ED Notes (Signed)
Report called to Kim, RN.

## 2015-05-25 NOTE — ED Notes (Signed)
Per EMS pt presents with c/o weakness and dizziness since Monday. EMS reports multiple episodes of N/V/D on Monday, "seemed to get better yesterday" and has continued nausea today. Pt states " I am so nauseous I can't stand, I can't keep anything down". EMS reports giving 4mg  Zofran IV en route.

## 2015-05-25 NOTE — Progress Notes (Signed)
ANTICOAGULATION CONSULT NOTE - Initial Consult  Pharmacy Consult for warfarin Indication: VTE treatment  Allergies  Allergen Reactions  . Penicillins Anaphylaxis  . Shellfish Allergy Anaphylaxis   Patient Measurements: Height: 5\' 6"  (167.6 cm) Weight: 163 lb (73.936 kg) IBW/kg (Calculated) : 59.3  Vital Signs: Temp: 97.6 F (36.4 C) (12/21 1338) Temp Source: Oral (12/21 1338) BP: 146/75 mmHg (12/21 1338) Pulse Rate: 94 (12/21 1338)  Labs:  Recent Labs  05/25/15 0735  HGB 12.5  HCT 38.3  PLT 237  APTT 32  LABPROT 22.7*  INR 2.02  CREATININE 1.13*  TROPONINI <0.03    Estimated Creatinine Clearance: 46.2 mL/min (by C-G formula based on Cr of 1.13).  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 Patient's home regimen is warfarin 4 mg PO daily.  Patient is admitted with complaints of dizziness and levofloxacin was started for CAP.  INR is therapeutic on admission at 2.02.  Patient already received warfarin 4 mg dose this evening.  Goal of Therapy:  INR 2-3 Monitor platelets by anticoagulation protocol: Yes   Plan:  Patient already received warfarin 4 mg home dose this evening.  Have discontinued order for warfarin 4 mg PO daily despite therapeutic INR in anticipation of drug interaction with levofloxacin, which can enhance anticoagulation effects of warfarin. INR ordered with AM labs tomorrow.   Pharmacy will continue to monitor, thank you for the consult.  61 [name] 05/25/2015,8:14 PM

## 2015-05-25 NOTE — ED Provider Notes (Signed)
[facility] Emergency Department Provider Note  ____________________________________________  Time seen: Approximately 7:32 AM  I have reviewed the triage vital signs and the nursing notes.   HISTORY  Chief Complaint Weakness and Dizziness    HPI [name] is a 74 y.o. female with coronary artery disease, CHF, DVT on Coumadin, hyperlipidemia, GERD who presents for evaluation of 3 days recurrent nonbloody nonbilious vomiting and nonbloody diarrhea as well as generalized weakness and lightheadedness, constant since onset, currently severe, lightheadedness is worse with sudden position change, specifically going from sitting or lying down to standing. Patient is feeling generally weak. She has had some abdominal soreness. No dysuria. She has also had productive cough with runny nose. She reports several family members have also been ill with vomiting. No chest pain or difficulty breathing.   Past Medical History  Diagnosis Date  . Arthritis   . Neuropathy (HCC)   . GERD (gastroesophageal reflux disease)   . Hypertension   . HLD (hyperlipidemia)   . Situs inversus with dextrocardia     CT 06/2011: situs inversus totalis.  What would typically be called RCA arose from anterior sinus of Valsalva.  What would typically be called the left main arises from the posterior sinus of Valsalva and gives rise to a large first diagonal branch and diminutive circumflex  . Dextrocardia   . Fracture 05/24/2011    right; "did not have surgery"  . DVT of leg (deep venous thrombosis) (HCC) ~2006    left  . Pneumonia 06/13/11    "couple times; long time ago"  . CAD (coronary artery disease)     NSTEMI in setting of gallstone pancreatitis 05/2011:  LHC with 3v CAD; CABG was performed 06/14/11: RIMA-LAD, SVG-ramus, SVG-RCA  . Ischemic cardiomyopathy     Echocardiogram 06/05/11: EF 40-45%, anteroseptal hypokinesis, apical hypokinesis, mild LAE  . Chronic bronchitis   . Anemia   .  H/O hiatal hernia   . Chronic systolic heart failure (HCC)   . Carotid stenosis     Dopplers 06/12/11: LICA 60-79%.  . Pulmonary embolus [facility]) March 2013  . Collagen vascular disease (HCC)   . CHF (congestive heart failure) [facility])     Patient Active Problem List   Diagnosis Date Noted  . Encounter for therapeutic drug monitoring 08/05/2013  . 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 CAD (coronary artery disease) 09/11/2011  . 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 Acute respiratory failure with hypoxia (HCC) 08/12/2011  . Saddle embolism of pulmonary artery (HCC) 08/12/2011  . Pericardial effusion 08/12/2011  . Atrial mass 08/12/2011  . Dyspnea 08/07/2011  . CAD 07/10/2011  . Cough 07/10/2011  . DM2 (diabetes mellitus, type 2) (HCC) 07/10/2011  . HLD (hyperlipidemia) 07/10/2011  . Carotid stenosis   . Hypokalemia 06/09/2011  . Hypomagnesemia 06/09/2011  . Dextrocardia 06/09/2011  . Acute myocardial infarction, subendocardial infarction, subsequent episode of care (HCC) 06/08/2011  . Chronic systolic heart failure (HCC) 06/08/2011  . Acute renal failure (HCC) 06/08/2011    Past Surgical History  Procedure Laterality Date  . Ercp  06/03/2011    Procedure: ENDOSCOPIC RETROGRADE CHOLANGIOPANCREATOGRAPHY (ERCP);  Surgeon: Petra Kuba, MD;  Location: [facility] OR;  Service: Endoscopy;  Laterality: N/A;  . Vaginal hysterectomy  1970's  . Cardiac catheterization  06/11/11  . Coronary artery bypass graft  06/14/2011    Procedure: CORONARY ARTERY BYPASS GRAFTING (CABG);  Surgeon: Alleen Borne, MD;  Location: [facility] OR;  Service: Open Heart Surgery;  Laterality: N/A;  . Left heart catheterization with coronary angiogram N/A 06/11/2011    Procedure: LEFT HEART CATHETERIZATION WITH CORONARY ANGIOGRAM;  Surgeon: Kathleene Hazel, MD;  Location: [facility] CATH LAB;  Service: Cardiovascular;  Laterality: N/A;  . Arch aortogram Right 06/11/2011    Procedure: ARCH AORTOGRAM;  Surgeon:  Kathleene Hazel, MD;  Location: [facility] CATH LAB;  Service: Cardiovascular;  Laterality: Right;  . Cholecystectomy      Current Outpatient Rx  Name  Route  Sig  Dispense  Refill  . alendronate (FOSAMAX) 70 MG tablet   Oral   Take 70 mg by mouth every Sunday.          Marland Kitchen allopurinol (ZYLOPRIM) 100 MG tablet   Oral   Take 100 mg by mouth 2 (two) times daily.          Marland Kitchen ALPRAZolam (XANAX) 0.5 MG tablet   Oral   Take 0.5 mg by mouth 2 (two) times daily as needed.         Marland Kitchen aspirin EC 81 MG tablet   Oral   Take 81 mg by mouth daily.         . cholecalciferol (VITAMIN D) 1000 UNITS tablet   Oral   Take 1,000 Units by mouth daily.         . folic acid (FOLVITE) 800 MCG tablet   Oral   Take 400 mcg by mouth daily.         . furosemide (LASIX) 40 MG tablet   Oral   Take 40 mg by mouth daily.         Marland Kitchen gabapentin (NEURONTIN) 300 MG capsule   Oral   Take 300 mg by mouth 2 (two) times daily.           . methotrexate (RHEUMATREX) 2.5 MG tablet   Oral   Take 22.5 mg by mouth every Monday.          . metoprolol tartrate (LOPRESSOR) 25 MG tablet   Oral   Take 1 tablet (25 mg total) by mouth 2 (two) times daily.   180 tablet   3     Pt will call when she is ready to have this filled   . omeprazole (PRILOSEC) 20 MG capsule   Oral   Take 20 mg by mouth every morning.           . simvastatin (ZOCOR) 20 MG tablet   Oral   Take 20 mg by mouth every evening.          . warfarin (COUMADIN) 2 MG tablet      Take as directed by coumadin clinic Patient taking differently: Take 4 mg by mouth every evening.    65 tablet   3     This is 1 month supply     Allergies Penicillins and Shellfish allergy  History reviewed. No pertinent family history.  Social History Social History  Substance Use Topics  . Smoking status: Never Smoker   . Smokeless tobacco: Never Used  . Alcohol Use: No    Review of Systems Constitutional: No fever/chills Eyes:  No visual changes. ENT: No sore throat. Cardiovascular: Denies chest pain. Respiratory: Denies shortness of breath. Gastrointestinal: + abdominal discomfort described as "soreness".  +nausea, + vomiting.  + diarrhea.  No constipation. Genitourinary: Negative for dysuria. Musculoskeletal: Negative for back pain. Skin: Negative for rash. Neurological: Negative for headaches, focal weakness or numbness.  10-point ROS otherwise negative.  ____________________________________________  PHYSICAL EXAM:  Filed Vitals:   05/25/15 0915 05/25/15 0930 05/25/15 0945 05/25/15 1000  BP: 135/81 143/77 124/84 137/81  Pulse: 91  95 94  Temp:      TempSrc:      Resp:   26 21  Height:      Weight:      SpO2: 94%  95% 94%       Constitutional: Alert and oriented. Appears nauseated and fatigued.  Eyes: Conjunctivae are normal. PERRL. EOMI. Head: Atraumatic. Nose: No congestion/rhinnorhea. Mouth/Throat: Mucous membranes are dry.  Oropharynx non-erythematous. Neck: No stridor.  Cardiovascular: Normal rate, regular rhythm. Grossly normal heart sounds.  Good peripheral circulation. Respiratory: Normal respiratory effort.  No retractions. Diminished breath sounds left lower lobe. Gastrointestinal: Soft with mild periumbilical and right lower quadrant tenderness.  No CVA tenderness. Normal bowel sounds.  Genitourinary: deferred Musculoskeletal: No lower extremity tenderness nor edema.  No joint effusions. Neurologic:  Normal speech and language. No gross focal neurologic deficits are appreciated.  Skin:  Skin is warm, dry and intact. No rash noted. Psychiatric: Mood and affect are normal. Speech and behavior are normal.  ____________________________________________   LABS (all labs ordered are listed, but only abnormal results are displayed)  Labs Reviewed  CBC WITH DIFFERENTIAL/PLATELET - Abnormal; Notable for the following:    RDW 18.1 (*)    All other components within normal limits   COMPREHENSIVE METABOLIC PANEL - Abnormal; Notable for the following:    Potassium 3.1 (*)    Glucose, Bld 127 (*)    Creatinine, Ser 1.13 (*)    GFR calc non Af Amer 47 (*)    GFR calc Af Amer 55 (*)    All other components within normal limits  URINALYSIS COMPLETEWITH MICROSCOPIC ([facility] ONLY) - Abnormal; Notable for the following:    Color, Urine YELLOW (*)    APPearance CLEAR (*)    Ketones, ur 1+ (*)    Protein, ur 30 (*)    Bacteria, UA RARE (*)    Squamous Epithelial / LPF 0-5 (*)    All other components within normal limits  PROTIME-INR - Abnormal; Notable for the following:    Prothrombin Time 22.7 (*)    All other components within normal limits  CULTURE, BLOOD (ROUTINE X 2)  CULTURE, BLOOD (ROUTINE X 2)  TROPONIN I  LIPASE, BLOOD  APTT   ____________________________________________  EKG  ED ECG REPORT I, Gayla Doss, the attending physician, personally viewed and interpreted this ECG.   Date: 05/25/2015  EKG Time: 07:30  Rate: 93  Rhythm: normal sinus rhythm  Axis: normal  Intervals:right bundle branch block  ST&T Change: No acute ST elevation. Frequent PVCs. EKG unchanged from 11/26/2014.  ____________________________________________  RADIOLOGY  CXR IMPRESSION: Coarse lung markings in the left lower lobe consistent with known bronchiectasis. However the density here is more conspicuous than in the past and may reflect superimposed infection and possible early pneumonia.  Followup PA and lateral chest X-ray is recommended in 3-4 weeks following trial of antibiotic therapy to ensure resolution and exclude underlying malignancy.   CT abdomen and pelvis IMPRESSION: Changes of sinus inversus.  Bibasilar atelectatic changes with mucous plugging.  No other focal abnormality is seen.  ____________________________________________   PROCEDURES  Procedure(s) performed: None  Critical Care performed:  No  ____________________________________________   INITIAL IMPRESSION / ASSESSMENT AND PLAN / ED COURSE  Pertinent labs & imaging results that were available during my care of the patient were reviewed by me and  considered in my medical decision making (see chart for details).  Esly Selvage Kirkendoll is a 74 y.o. female with coronary artery disease, CHF, DVT on Coumadin, hyperlipidemia, GERD who presents for evaluation of 3 days recurrent nonbloody nonbilious vomiting and nonbloody diarrhea as well as generalized weakness and lightheadedness. Exam, she appears nauseated and dehydrated as well as fatigues. Vital signs are stable, she is afebrile. She has faint tenderness in the periumbilical region as well as the right abdomen. She has an intact neurological examination and her positional lightheadedness is likely due to dehydration. She does not have any room spinning dizziness and I doubt acute CVA. Plan for screening labs, we'll treat her symptomatically, give IV fluids, check urinalysis as well as CXR and CT abdomen and pelvis. Reassess for disposition.  ----------------------------------------- 11:20 AM on 05/25/2015 -----------------------------------------  CT of the abdomen and pelvis is negative for any acute [place]. Chest x-ray concerning for possible early left lower lobe pneumonia. At this time the patient continues to feel weak, lightheaded with position change. We'll admit for IV antibiotics. Levaquin ordered. Case discussed with Dr. Juliene Pina for admission at this time. ____________________________________________   FINAL CLINICAL IMPRESSION(S) / ED DIAGNOSES  Final diagnoses:  Vomiting and diarrhea  Positional lightheadedness      Gayla Doss, MD 05/25/15 1121

## 2015-05-25 NOTE — Progress Notes (Signed)
ANTIBIOTIC CONSULT NOTE - INITIAL  Pharmacy Consult for levofloxacin Indication: CAP  Allergies  Allergen Reactions  . Penicillins Anaphylaxis  . Shellfish Allergy Anaphylaxis    Patient Measurements: Height: 5\' 6"  (167.6 cm) Weight: 163 lb (73.936 kg) IBW/kg (Calculated) : 59.3  Vital Signs: Temp: 97.6 F (36.4 C) (12/21 1338) Temp Source: Oral (12/21 1338) BP: 146/75 mmHg (12/21 1338) Pulse Rate: 94 (12/21 1338) Intake/Output from previous day:   Intake/Output from this shift:    Labs:  Recent Labs  05/25/15 0735  WBC 8.8  HGB 12.5  PLT 237  CREATININE 1.13*   Estimated Creatinine Clearance: 46.2 mL/min (by C-G formula based on Cr of 1.13). No results for input(s): VANCOTROUGH, VANCOPEAK, VANCORANDOM, GENTTROUGH, GENTPEAK, GENTRANDOM, TOBRATROUGH, TOBRAPEAK, TOBRARND, AMIKACINPEAK, AMIKACINTROU, AMIKACIN in the last 72 hours.   Microbiology: No results found for this or any previous visit (from the past 720 hour(s)).  Medical History: Past Medical History  Diagnosis Date  . Arthritis   . Neuropathy (HCC)   . GERD (gastroesophageal reflux disease)   . Hypertension   . HLD (hyperlipidemia)   . Situs inversus with dextrocardia     CT 06/2011: situs inversus totalis.  What would typically be called RCA arose from anterior sinus of Valsalva.  What would typically be called the left main arises from the posterior sinus of Valsalva and gives rise to a large first diagonal branch and diminutive circumflex  . Dextrocardia   . Fracture 05/24/2011    right; "did not have surgery"  . DVT of leg (deep venous thrombosis) (HCC) ~2006    left  . Pneumonia 06/13/11    "couple times; long time ago"  . CAD (coronary artery disease)     NSTEMI in setting of gallstone pancreatitis 05/2011:  LHC with 3v CAD; CABG was performed 06/14/11: RIMA-LAD, SVG-ramus, SVG-RCA  . Ischemic cardiomyopathy     Echocardiogram 06/05/11: EF 40-45%, anteroseptal hypokinesis, apical hypokinesis,  mild LAE  . Chronic bronchitis   . Anemia   . H/O hiatal hernia   . Chronic systolic heart failure (HCC)   . Carotid stenosis     Dopplers 06/12/11: LICA 60-79%.  . Pulmonary embolus [facility]) March 2013  . Collagen vascular disease (HCC)   . CHF (congestive heart failure) (HCC)     Assessment: Pharmacy consulted to dose levofloxacin for CAP in this 74 year old female.   CrCl ~ 46 mL/min  Plan:  Levofloxacin 750 mg IV q 48 hours based on indication and renal function.  Pharmacy will continue to monitor, thank you for the consult.  61 [name] 05/25/2015,8:09 PM

## 2015-05-25 NOTE — H&P (Signed)
[facility] - [place] at [facility]   PATIENT NAME: [name]    MR#:  [id number]  DATE OF BIRTH:  [birth year]  DATE OF ADMISSION:  05/25/2015  PRIMARY CARE PHYSICIAN: Lolita Patella, MD   REQUESTING/REFERRING PHYSICIAN: Dr Inocencio Homes  CHIEF COMPLAINT:  Dizziness HISTORY OF PRESENT ILLNESS:  [name]  is a 74 y.o. 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 Patient reports since Monday she's had nausea vomiting diarrhea. The diarrhea actually has subsided. She continues to have some nausea. She presented today due to her weakness and dizziness. She reports her dizziness as a spinning sensation especially when she sits up. It is exacerbated by head movement and sitting up. She has no neurological deficits. Her weakness is generalized and not focal. She also reports a productive cough of greenish sputum over the past 2 days. She denies fevers or travel history. She does report that several family members have been ill with vomiting.  PAST MEDICAL HISTORY:   Past Medical History  Diagnosis Date  . Arthritis   . Neuropathy (HCC)   . GERD (gastroesophageal reflux disease)   . Hypertension   . HLD (hyperlipidemia)   . Situs inversus with dextrocardia     CT 06/2011: situs inversus totalis.  What would typically be called RCA arose from anterior sinus of Valsalva.  What would typically be called the left main arises from the posterior sinus of Valsalva and gives rise to a large first diagonal branch and diminutive circumflex  . Dextrocardia   . Fracture 05/24/2011    right; "did not have surgery"  . DVT of leg (deep venous thrombosis) (HCC) ~2006    left  . Pneumonia 06/13/11    "couple times; long time ago"  . CAD (coronary artery disease)     NSTEMI in setting of gallstone pancreatitis 05/2011:  LHC with 3v CAD; CABG was performed 06/14/11: RIMA-LAD,  SVG-ramus, SVG-RCA  . Ischemic cardiomyopathy     Echocardiogram 06/05/11: EF 40-45%, anteroseptal hypokinesis, apical hypokinesis, mild LAE  . Chronic bronchitis   . Anemia   . H/O hiatal hernia   . Chronic systolic heart failure (HCC)   . Carotid stenosis     Dopplers 06/12/11: LICA 60-79%.  . Pulmonary embolus [facility] March 2013  . Collagen vascular disease (HCC)   . CHF (congestive heart failure) (HCC)     PAST SURGICAL HISTORY:   Past Surgical History  Procedure Laterality Date  . Ercp  06/03/2011    Procedure: ENDOSCOPIC RETROGRADE CHOLANGIOPANCREATOGRAPHY (ERCP);  Surgeon: Petra Kuba, MD;  Location: [facility] OR;  Service: Endoscopy;  Laterality: N/A;  . Vaginal hysterectomy  1970's  . Cardiac catheterization  06/11/11  . Coronary artery bypass graft  06/14/2011    Procedure: CORONARY ARTERY BYPASS GRAFTING (CABG);  Surgeon: Alleen Borne, MD;  Location: [facility] OR;  Service: Open Heart Surgery;  Laterality: N/A;  . Left heart catheterization with coronary angiogram N/A 06/11/2011    Procedure: LEFT HEART CATHETERIZATION WITH CORONARY ANGIOGRAM;  Surgeon: Kathleene Hazel, MD;  Location: [facility] CATH LAB;  Service: Cardiovascular;  Laterality: N/A;  . Arch aortogram Right 06/11/2011    Procedure: ARCH AORTOGRAM;  Surgeon: Kathleene Hazel, MD;  Location: [facility] CATH LAB;  Service: Cardiovascular;  Laterality: Right;  . Cholecystectomy      SOCIAL HISTORY:   Social History  Substance Use Topics  . Smoking status: Never  Smoker   . Smokeless tobacco: Never Used  . Alcohol Use: No    FAMILY HISTORY:   Hypertension DRUG ALLERGIES:   Allergies  Allergen Reactions  . Penicillins Anaphylaxis  . Shellfish Allergy Anaphylaxis     REVIEW OF SYSTEMS:  CONSTITUTIONAL: No fever, ++fatigue and weakness.  EYES: No blurred or double vision.  EARS, NOSE, AND THROAT: No tinnitus or ear pain.  RESPIRATORY: No cough, shortness of breath, wheezing or hemoptysis.  CARDIOVASCULAR: No chest pain,  orthopnea, edema.  GASTROINTESTINAL: ++ nausea, vomiting, diarrhea and abdominal pain has resolved except nausea.Marland Kitchen  GENITOURINARY: No dysuria, hematuria.  ENDOCRINE: No polyuria, nocturia,  HEMATOLOGY: No anemia, easy bruising or bleeding SKIN: No rash or lesion. MUSCULOSKELETAL: No joint pain or arthritis.   NEUROLOGIC: No tingling, numbness, she has generalized weakness and vertigo PSYCHIATRY: No anxiety or depression.   MEDICATIONS AT HOME:   Prior to Admission medications   Medication Sig Start Date End Date Taking? Authorizing Provider  alendronate (FOSAMAX) 70 MG tablet Take 70 mg by mouth every Sunday.    Yes Historical Provider, MD  allopurinol (ZYLOPRIM) 100 MG tablet Take 100 mg by mouth 2 (two) times daily.    Yes Historical Provider, MD  ALPRAZolam Prudy Feeler) 0.5 MG tablet Take 0.5 mg by mouth 2 (two) times daily as needed.   Yes Historical Provider, MD  aspirin EC 81 MG tablet Take 81 mg by mouth daily.   Yes Historical Provider, MD  cholecalciferol (VITAMIN D) 1000 UNITS tablet Take 1,000 Units by mouth daily.   Yes Historical Provider, MD  folic acid (FOLVITE) 800 MCG tablet Take 400 mcg by mouth daily.   Yes Historical Provider, MD  furosemide (LASIX) 40 MG tablet Take 40 mg by mouth daily.   Yes Historical Provider, MD  gabapentin (NEURONTIN) 300 MG capsule Take 300 mg by mouth 2 (two) times daily.     Yes Historical Provider, MD  methotrexate (RHEUMATREX) 2.5 MG tablet Take 22.5 mg by mouth every Monday.    Yes Historical Provider, MD  metoprolol tartrate (LOPRESSOR) 25 MG tablet Take 1 tablet (25 mg total) by mouth 2 (two) times daily. 11/26/14  Yes Kathleene Hazel, MD  omeprazole (PRILOSEC) 20 MG capsule Take 20 mg by mouth every morning.     Yes Historical Provider, MD  simvastatin (ZOCOR) 20 MG tablet Take 20 mg by mouth every evening.    Yes Historical Provider, MD  warfarin (COUMADIN) 2 MG tablet Take as directed by coumadin clinic Patient taking differently:  Take 4 mg by mouth every evening.  04/20/15  Yes Kathleene Hazel, MD      VITAL SIGNS:  Blood pressure 137/81, pulse 94, temperature 97.9 F (36.6 C), temperature source Oral, resp. rate 21, height 5\' 6"  (1.676 m), weight 73.936 kg (163 lb), SpO2 94 %.  PHYSICAL EXAMINATION:  GENERAL:  74 y.o.-year-old patient lying in the bed with no acute distress.  EYES: Pupils equal, round, reactive to light and accommodation. No scleral icterus. Extraocular muscles intact.  HEENT: Head atraumatic, normocephalic. Oropharynx and nasopharynx clear.  NECK:  Supple, no jugular venous distention. No thyroid enlargement, no tenderness.  LUNGS: Normal breath sounds bilaterally, no wheezing, rales,rhonchi or crepitation. No use of accessory muscles of respiration.  CARDIOVASCULAR: S1, S2 normal. No murmurs, rubs, or gallops.  ABDOMEN: Soft, nontender, nondistended. Bowel sounds present. No organomegaly or mass.  EXTREMITIES: No pedal edema, cyanosis, or clubbing.  NEUROLOGIC: Cranial nerves II through XII are grossly intact. No focal  deficits. PSYCHIATRIC: The patient is alert and oriented x 3.  SKIN: No obvious rash, lesion, or ulcer.   LABORATORY PANEL:   CBC  Recent Labs Lab 05/25/15 0735  WBC 8.8  HGB 12.5  HCT 38.3  PLT 237   ------------------------------------------------------------------------------------------------------------------  Chemistries   Recent Labs Lab 05/25/15 0735  NA 140  K 3.1*  CL 102  CO2 28  GLUCOSE 127*  BUN 16  CREATININE 1.13*  CALCIUM 9.0  AST 20  ALT 14  ALKPHOS 91  BILITOT 0.8   ------------------------------------------------------------------------------------------------------------------  Cardiac Enzymes  Recent Labs Lab 05/25/15 0735  TROPONINI <0.03   ------------------------------------------------------------------------------------------------------------------  RADIOLOGY:  Dg Chest 2 View  05/25/2015  CLINICAL DATA:   Two days of vomiting with weakness and dizziness, history of previous pulmonary emboli, diabetes, status post CABG, sinus inversus. EXAM: CHEST  2 VIEW COMPARISON:  Chest x-ray of May 25, 2013 FINDINGS: The lungs are adequately inflated. The interstitial markings are coarse bilaterally. There is no alveolar infiltrate but there is increased density at the left lung base consistent with atelectasis. Stable blunting of the lateral costophrenic angles is noted. There is no large pleural effusion. There is no pneumothorax. There are post CABG changes. The heart is top-normal in size. The pulmonary vascularity is not engorged. There are 7 intact sternal wires. IMPRESSION: Coarse lung markings in the left lower lobe consistent with known bronchiectasis. However the density here is more conspicuous than in the past and may reflect superimposed infection and possible early pneumonia. Followup PA and lateral chest X-ray is recommended in 3-4 weeks following trial of antibiotic therapy to ensure resolution and exclude underlying malignancy. Electronically Signed   By: David  Swaziland M.D.   On: 05/25/2015 09:05   Ct Abdomen Pelvis W Contrast  05/25/2015  CLINICAL DATA:  Weakness with nausea and vomiting EXAM: CT ABDOMEN AND PELVIS WITH CONTRAST TECHNIQUE: Multidetector CT imaging of the abdomen and pelvis was performed using the standard protocol following bolus administration of intravenous contrast. CONTRAST:  9mL OMNIPAQUE IOHEXOL 300 MG/ML  SOLN COMPARISON:  05/25/2013 FINDINGS: Lung bases demonstrate mild dependent atelectatic changes with mucous plugging. No focal confluent infiltrate is seen. Changes of sinus inversus are noted with cardiac apex pointing right. The liver, spleen,, adrenal glands and pancreas are within normal limits. The gallbladder has been surgically removed. Pneumobilia is noted. Kidneys demonstrate no evidence of renal calculi or urinary tract obstructive changes. An IVC filter is noted in  place. Aortoiliac calcifications are noted without aneurysmal dilatation. The appendix is been surgically removed. The bladder is well distended. No pelvic mass lesion is seen. Degenerative changes of the lumbar spine are noted. IMPRESSION: Changes of sinus inversus. Bibasilar atelectatic changes with mucous plugging. No other focal abnormality is seen. Electronically Signed   By: Alcide Clever M.D.   On: 05/25/2015 10:43    EKG:   Sinus rhythm  IMPRESSION AND PLAN:   74 year old female with a history of CAD status post CABG, permanent emboli, C2 situ inversus with dextrocardia who presented to the emergency room with dizziness and cough.  1. Community acquired pneumonia: Chest x-ray is consistent with community-acquired pneumonia. Continue Levaquin and follow up on blood culture.  2. Dizziness: This is likely secondary to volume depletion. She she had nausea vomiting diarrhea on Monday and then her dizziness preceded this. She has no neurological deficits. Continue IV fluids and physical therapy consultation.  3. History of CAD status post CABG: Continue aspirin, statin and beta blocker.  4. History  of saddle emboli and atrial mass: Continue Coumadin therapy which she will need lifelong. Pharmacy consultation for Coumadin.  5. Chronic systolic heart failure last ejection fraction 50-55%: Continue Lasix and beta blocker. No ACE inhibitor with history of hypotension. ,  All the records are reviewed and case discussed with ED provider. Management plans discussed with the patient and she is in agreement.  CODE STATUS: DNR  TOTAL TIME TAKING CARE OF THIS PATIENT: 45 minutes.    Geraldina Parrott M.D on 05/25/2015 at 11:32 AM  Between 7am to 6pm - Pager - [phone] After 6pm go to [email or web address] - password [email or web address] [facility]  [facility]  Office  [phone]  CC: Primary care physician; Lolita Patella, MD

## 2015-05-25 NOTE — Care Management Obs Status (Signed)
MEDICARE OBSERVATION STATUS NOTIFICATION   Patient Details  Name: [name] MRN: [id number] Date of Birth: [birth year]   Medicare Observation Status Notification Given:  Lora Paula notice to patient and family at bedside in the ER.    Berna Bue, RN 05/25/2015, 11:39 AM

## 2015-05-26 DIAGNOSIS — E869 Volume depletion, unspecified: Secondary | ICD-10-CM | POA: Diagnosis not present

## 2015-05-26 DIAGNOSIS — R42 Dizziness and giddiness: Secondary | ICD-10-CM | POA: Diagnosis not present

## 2015-05-26 DIAGNOSIS — G249 Dystonia, unspecified: Secondary | ICD-10-CM | POA: Diagnosis not present

## 2015-05-26 DIAGNOSIS — E785 Hyperlipidemia, unspecified: Secondary | ICD-10-CM | POA: Diagnosis not present

## 2015-05-26 DIAGNOSIS — J189 Pneumonia, unspecified organism: Secondary | ICD-10-CM | POA: Diagnosis not present

## 2015-05-26 DIAGNOSIS — E876 Hypokalemia: Secondary | ICD-10-CM | POA: Diagnosis not present

## 2015-05-26 DIAGNOSIS — E119 Type 2 diabetes mellitus without complications: Secondary | ICD-10-CM | POA: Diagnosis not present

## 2015-05-26 DIAGNOSIS — I10 Essential (primary) hypertension: Secondary | ICD-10-CM | POA: Diagnosis not present

## 2015-05-26 LAB — BASIC METABOLIC PANEL
Anion gap: 7 (ref 5–15)
BUN: 11 mg/dL (ref 6–20)
CALCIUM: 8.4 mg/dL — AB (ref 8.9–10.3)
CO2: 29 mmol/L (ref 22–32)
CREATININE: 1.21 mg/dL — AB (ref 0.44–1.00)
Chloride: 107 mmol/L (ref 101–111)
GFR calc Af Amer: 51 mL/min — ABNORMAL LOW (ref >60)
GFR calc non Af Amer: 44 mL/min — ABNORMAL LOW (ref >60)
GLUCOSE: 93 mg/dL (ref 65–99)
Potassium: 3.1 mmol/L — ABNORMAL LOW (ref 3.5–5.1)
Sodium: 143 mmol/L (ref 135–145)

## 2015-05-26 LAB — PROTIME-INR
INR: 2.49
PROTHROMBIN TIME: 26.6 s — AB (ref 11.4–15.0)

## 2015-05-26 LAB — MRSA PCR SCREENING: MRSA by PCR: POSITIVE — AB

## 2015-05-26 LAB — CBC
HCT: 37.5 % (ref 35.0–47.0)
HEMOGLOBIN: 12.2 g/dL (ref 12.0–16.0)
MCH: 32.8 pg (ref 26.0–34.0)
MCHC: 32.5 g/dL (ref 32.0–36.0)
MCV: 100.9 fL — ABNORMAL HIGH (ref 80.0–100.0)
PLATELETS: 239 10*3/uL (ref 150–440)
RBC: 3.71 MIL/uL — ABNORMAL LOW (ref 3.80–5.20)
RDW: 18.6 % — ABNORMAL HIGH (ref 11.5–14.5)
WBC: 8.9 10*3/uL (ref 3.6–11.0)

## 2015-05-26 LAB — MAGNESIUM: Magnesium: 1.5 mg/dL — ABNORMAL LOW (ref 1.7–2.4)

## 2015-05-26 MED ORDER — MAGNESIUM SULFATE 2 GM/50ML IV SOLN
2.0000 g | Freq: Once | INTRAVENOUS | Status: AC
  Administered 2015-05-26: 2 g via INTRAVENOUS
  Filled 2015-05-26: qty 50

## 2015-05-26 MED ORDER — LEVOFLOXACIN 250 MG PO TABS
750.0000 mg | ORAL_TABLET | ORAL | Status: DC
  Administered 2015-05-27: 750 mg via ORAL
  Filled 2015-05-26: qty 1
  Filled 2015-05-26: qty 3

## 2015-05-26 MED ORDER — WARFARIN - PHARMACIST DOSING INPATIENT
Freq: Every day | Status: DC
  Administered 2015-05-26: 18:00:00

## 2015-05-26 MED ORDER — WARFARIN SODIUM 1 MG PO TABS
2.0000 mg | ORAL_TABLET | Freq: Every day | ORAL | Status: DC
  Administered 2015-05-26: 2 mg via ORAL
  Filled 2015-05-26: qty 2
  Filled 2015-05-26: qty 1

## 2015-05-26 MED ORDER — POTASSIUM CHLORIDE CRYS ER 20 MEQ PO TBCR
40.0000 meq | EXTENDED_RELEASE_TABLET | Freq: Two times a day (BID) | ORAL | Status: AC
  Administered 2015-05-26 (×2): 40 meq via ORAL
  Filled 2015-05-26 (×2): qty 2

## 2015-05-26 NOTE — Care Management (Signed)
Spoke with patient for discharge planning. She is alert oriented and normally independent. Drives self and lives with spouse. Patient stated that spouse is in rehab at this time due to recent hospitalization. Patient stated that she normally doe not use a walker but has one in the home. Patient stated that she is very "light headed" when standing. PT is working with patient but unable to ambulate far at this time due to dizzyness. Patient is on O2 here but does not use at home. No history of COPD or CHF per patient recollection. Anticipate H H at a minimum at discharge however, wait and see how she is able to do with PT. Patient stated that her husband has had [facility] prior and that if she needs Home Health she would prefer [facility].

## 2015-05-26 NOTE — Plan of Care (Signed)
Problem: Acute Rehab PT Goals(only PT should resolve) Goal: Pt Will Go Supine/Side To Sit Pt will demonstrate ModI bed mobility supine to/form sitting edge-of-bed without onset of dizziness or vertigo to return to PLOF and to decrease caregiver burden.     Goal: Patient Will Transfer Sit To/From Stand Pt will transfer sit to/from-stand with LRAD at ModI 5x in less than 13s without loss-of-balance to demonstrate good safety awareness for independent mobility in home.     Goal: Pt Will Ambulate Pt will ambulate with RW at Supervision using a step-through pattern and equal step length for a distances greater than 267ft maintaining SaO2 >89% to demonstrate the ability to perform safe household distance ambulation at discharge.

## 2015-05-26 NOTE — Progress Notes (Signed)
[facility] - [place] at [facility]   PATIENT NAME: [name]    MR#:  [id number]  DATE OF BIRTH:  11-08-1940  SUBJECTIVE:  CHIEF COMPLAINT:   Chief Complaint  Patient presents with  . Weakness  . Dizziness  weakness  REVIEW OF SYSTEMS:  CONSTITUTIONAL: No fever, has weakness.  EYES: No blurred or double vision.  EARS, NOSE, AND THROAT: No tinnitus or ear pain.  RESPIRATORY: No cough, no shortness of breath, wheezing or hemoptysis.  CARDIOVASCULAR: No chest pain, orthopnea, edema.  GASTROINTESTINAL: has nausea, no vomiting, diarrhea or abdominal pain.  GENITOURINARY: No dysuria, hematuria.  ENDOCRINE: No polyuria, nocturia,  HEMATOLOGY: No anemia, easy bruising or bleeding SKIN: No rash or lesion. MUSCULOSKELETAL: No joint pain or arthritis.   NEUROLOGIC: No tingling, numbness, weakness.  PSYCHIATRY: No anxiety or depression.   DRUG ALLERGIES:   Allergies  Allergen Reactions  . Penicillins Anaphylaxis  . Shellfish Allergy Anaphylaxis    VITALS:  Blood pressure 99/57, pulse 88, temperature 98.3 F (36.8 C), temperature source Oral, resp. rate 17, height 5\' 6"  (1.676 m), weight 73.936 kg (163 lb), SpO2 92 %.  PHYSICAL EXAMINATION:  GENERAL:  74 y.o.-year-old patient lying in the bed with no acute distress.  EYES: Pupils equal, round, reactive to light and accommodation. No scleral icterus. Extraocular muscles intact.  HEENT: Head atraumatic, normocephalic. Oropharynx and nasopharynx clear.  NECK:  Supple, no jugular venous distention. No thyroid enlargement, no tenderness.  LUNGS: Normal breath sounds bilaterally, no wheezing, rales,rhonchi or crepitation. No use of accessory muscles of respiration.  CARDIOVASCULAR: S1, S2 normal. No murmurs, rubs, or gallops.  ABDOMEN: Soft, nontender, nondistended. Bowel sounds present. No organomegaly or mass.  EXTREMITIES: No pedal edema, cyanosis, or clubbing.  NEUROLOGIC: Cranial nerves II through  XII are intact. Muscle strength 5/5 in all extremities. Sensation intact. Gait not checked.  PSYCHIATRIC: The patient is alert and oriented x 3.  SKIN: No obvious rash, lesion, or ulcer.    LABORATORY PANEL:   CBC  Recent Labs Lab 05/26/15 0434  WBC 8.9  HGB 12.2  HCT 37.5  PLT 239   ------------------------------------------------------------------------------------------------------------------  Chemistries   Recent Labs Lab 05/25/15 0735 05/26/15 0434  NA 140 143  K 3.1* 3.1*  CL 102 107  CO2 28 29  GLUCOSE 127* 93  BUN 16 11  CREATININE 1.13* 1.21*  CALCIUM 9.0 8.4*  MG  --  1.5*  AST 20  --   ALT 14  --   ALKPHOS 91  --   BILITOT 0.8  --    ------------------------------------------------------------------------------------------------------------------  Cardiac Enzymes  Recent Labs Lab 05/25/15 0735  TROPONINI <0.03   ------------------------------------------------------------------------------------------------------------------  RADIOLOGY:  Dg Chest 2 View  05/25/2015  CLINICAL DATA:  Two days of vomiting with weakness and dizziness, history of previous pulmonary emboli, diabetes, status post CABG, sinus inversus. EXAM: CHEST  2 VIEW COMPARISON:  Chest x-ray of May 25, 2013 FINDINGS: The lungs are adequately inflated. The interstitial markings are coarse bilaterally. There is no alveolar infiltrate but there is increased density at the left lung base consistent with atelectasis. Stable blunting of the lateral costophrenic angles is noted. There is no large pleural effusion. There is no pneumothorax. There are post CABG changes. The heart is top-normal in size. The pulmonary vascularity is not engorged. There are 7 intact sternal wires. IMPRESSION: Coarse lung markings in the left lower lobe consistent with known bronchiectasis. However the density here is more conspicuous than in the  past and may reflect superimposed infection and possible early  pneumonia. Followup PA and lateral chest X-ray is recommended in 3-4 weeks following trial of antibiotic therapy to ensure resolution and exclude underlying malignancy. Electronically Signed   By: David  [place] M.D.   On: 05/25/2015 09:05   Ct Abdomen Pelvis W Contrast  05/25/2015  CLINICAL DATA:  Weakness with nausea and vomiting EXAM: CT ABDOMEN AND PELVIS WITH CONTRAST TECHNIQUE: Multidetector CT imaging of the abdomen and pelvis was performed using the standard protocol following bolus administration of intravenous contrast. CONTRAST:  55mL OMNIPAQUE IOHEXOL 300 MG/ML  SOLN COMPARISON:  05/25/2013 FINDINGS: Lung bases demonstrate mild dependent atelectatic changes with mucous plugging. No focal confluent infiltrate is seen. Changes of sinus inversus are noted with cardiac apex pointing right. The liver, spleen,, adrenal glands and pancreas are within normal limits. The gallbladder has been surgically removed. Pneumobilia is noted. Kidneys demonstrate no evidence of renal calculi or urinary tract obstructive changes. An IVC filter is noted in place. Aortoiliac calcifications are noted without aneurysmal dilatation. The appendix is been surgically removed. The bladder is well distended. No pelvic mass lesion is seen. Degenerative changes of the lumbar spine are noted. IMPRESSION: Changes of sinus inversus. Bibasilar atelectatic changes with mucous plugging. No other focal abnormality is seen. Electronically Signed   By: Alcide Clever M.D.   On: 05/25/2015 10:43    EKG:   Orders placed or performed during the hospital encounter of 05/25/15  . ED EKG  . ED EKG  . EKG 12-Lead  . EKG 12-Lead  . EKG 12-Lead  . EKG 12-Lead  . EKG 12-Lead  . EKG 12-Lead  . EKG 12-Lead  . EKG 12-Lead    ASSESSMENT AND PLAN:   74 year old female with a history of CAD status post CABG, permanent emboli, C2 situ inversus with dextrocardia who presented to the emergency room with dizziness and cough.  1. Community  acquired pneumonia: Chest x-ray is consistent with community-acquired pneumonia. Continue Levaquin and follow up blood culture.  2. Dizziness: This is likely secondary to volume depletion. She she had nausea vomiting diarrhea on Monday and then her dizziness preceded this. She has no neurological deficits. Continue IV fluids. Hold home lasix. Per physical therapy consultation, need HHPT.  3. History of CAD status post CABG: Continue aspirin, statin and hold beta blocker.  4. History of saddle emboli and atrial mass: Continue Coumadin therapy which she will need lifelong. INR is therapeutic.  5. Chronic systolic heart failure last ejection fraction 50-55%: Hold Lasix and beta blocker due to low BP. No ACE inhibitor with history of hypotension.     All the records are reviewed and case discussed with Care Management/Social Workerr. Management plans discussed with the patient, family and they are in agreement.  CODE STATUS: DNR  TOTAL TIME TAKING CARE OF THIS PATIENT: 35 minutes.  Greater than 50% time was spent on coordination of care and face-to-face counseling.  POSSIBLE D/C IN 2 DAYS, DEPENDING ON CLINICAL CONDITION.   Shaune Pollack M.D on 05/26/2015 at 4:00 PM  Between 7am to 6pm - Pager - [phone]  After 6pm go to [email or web address] - password [email or web address] [facility]  [place] [facility]  Office  [phone]  CC: Primary care physician; Lolita Patella, MD

## 2015-05-26 NOTE — Evaluation (Signed)
Physical Therapy Evaluation Patient Details Name: [name] MRN: [id number] DOB: 1941/02/13 Today's Date: 05/26/2015   History of Present Illness  Pt is a 74yo white female who reports N/V and diarrhea starting on 12/19, which then resulted in intermittent, episodic vertigo, as well as dizziness, and feeling as though she might pass out. Pt arrived at [facility] on 12/21, also reporting a couple days productive cough with green phlegm. Pt admitted with CAP. Pt denies any previous history of episodic spinning sensations.   Clinical Impression  Pt is received semirecumbent in bed upon entry, awake, alert, and willing to participate. No acute distress noted. Pt is A&Ox3 and pleasant. Pt reports zero falls in the last 6 months. Functional mobility reveal moderate global weakness, limiting independence and safety with transfes. Occulomotor exam is unremarkable, with very slight R beating nystagmus at the end of Left lateral gaze which resolves with change from reclining to sitting EOB. Pt is received with 2L [place] doffed, SaO2 at 82%. With standing at bedside, SaO2 requires 4L to remain at 87% with HR in 120's. Pt demonstrate multiple LOB with minimal balance screening at bedside, and reports worsening dizziness with sharpened Rhomberg. Pt does not use O2 at home. Patient presenting with impairment of strength, balance, oxygen perfusion, and activity tolerance, limiting ability to perform ADL and mobility tasks at  baseline level of function. Patient will benefit from skilled intervention to address the above impairments and limitations, in order to restore to prior level of function, improve patient safety upon discharge, and to decrease falls risk. Pt is appropriate for DC to home with HHPT once she is medically stable.       Follow Up Recommendations Home health PT    Equipment Recommendations  Rolling walker with 5" wheels    Recommendations for Other Services       Precautions / Restrictions  Precautions Precautions: Fall Restrictions Weight Bearing Restrictions: No      Mobility  Bed Mobility Overal bed mobility: Modified Independent             General bed mobility comments: Performs cautiously, as movement in bed triggering episodic vertigo yesterday.   Transfers Overall transfer level: Needs assistance Equipment used: Rolling walker (2 wheeled) Transfers: Sit to/from Stand Sit to Stand: Min guard         General transfer comment: appears unsteady and weak, performed c poor confidence.   Ambulation/Gait Ambulation/Gait assistance:  (Deferred at this time due to unsafe vitals and pt reports of feeling poor. )              Stairs            Wheelchair Mobility    Modified Rankin (Stroke Patients Only)       Balance Overall balance assessment: Needs assistance Sitting-balance support: Feet unsupported;No upper extremity supported Sitting balance-Leahy Scale: Good         Standing balance comment: LOB with transfers and sharpened rhomberg; DC further testing at this time due to worsening O2 sats and tachycardia.                              Pertinent Vitals/Pain Pain Assessment: No/denies pain    Home Living Family/patient expects to be discharged to:: Private residence Living Arrangements: Spouse/significant other Available Help at Discharge: Family Type of Home: Apartment Home Access: Level entry     Home Layout: One level Home Equipment: None Additional Comments: Has some  DME that belongs to her husband who is currently taking radiation for CA.     Prior Function Level of Independence: Independent         Comments: Pt reports communityambulation without assistive device or limitations.      Hand Dominance   Dominant Hand: Right    Extremity/Trunk Assessment   Upper Extremity Assessment: Overall WFL for tasks assessed           Lower Extremity Assessment: Generalized weakness (Requires Single  UE assistance for standing due to feeling weak andunstable. )      Cervical / Trunk Assessment: Normal  Communication   Communication: No difficulties  Cognition Arousal/Alertness: Awake/alert Behavior During Therapy: WFL for tasks assessed/performed Overall Cognitive Status: Within Functional Limits for tasks assessed                      General Comments      Exercises        Assessment/Plan    PT Assessment Patient needs continued PT services  PT Diagnosis Difficulty walking;Generalized weakness   PT Problem List Decreased strength;Decreased activity tolerance;Decreased mobility;Cardiopulmonary status limiting activity  PT Treatment Interventions DME instruction;Gait training;Functional mobility training;Therapeutic activities;Therapeutic exercise;Balance training   PT Goals (Current goals can be found in the Care Plan section) Acute Rehab PT Goals Patient Stated Goal: resolve dizziness, return to home.  PT Goal Formulation: With patient Time For Goal Achievement: 06/09/15 Potential to Achieve Goals: Good    Frequency Min 2X/week   Barriers to discharge Decreased caregiver support Reports that family is busy, but able to help intermittently.     Co-evaluation               End of Session Equipment Utilized During Treatment: Gait belt Activity Tolerance: Patient limited by fatigue Patient left: in bed;with bed alarm set;with family/visitor present;with call bell/phone within reach Nurse Communication: Mobility status;Other (comment) (O2 Sats;)    Functional Assessment Tool Used: Clinical Judgment  Functional Limitation: Mobility: Walking and moving around Mobility: Walking and Moving Around Current Status [phone]): At least 80 percent but less than 100 percent impaired, limited or restricted Mobility: Walking and Moving Around Goal Status [phone]): At least 40 percent but less than 60 percent impaired, limited or restricted    Time: 0840-0903 PT  Time Calculation (min) (ACUTE ONLY): 23 min   Charges:   PT Evaluation $Initial PT Evaluation Tier I: 1 Procedure     PT G Codes:   PT G-Codes **NOT FOR INPATIENT CLASS** Functional Assessment Tool Used: Clinical Judgment  Functional Limitation: Mobility: Walking and moving around Mobility: Walking and Moving Around Current Status (Q3009): At least 80 percent but less than 100 percent impaired, limited or restricted Mobility: Walking and Moving Around Goal Status [phone]): At least 40 percent but less than 60 percent impaired, limited or restricted    Safir Michalec C 05/26/2015, 9:20 AM  9:27 AM  Rosamaria Lints, PT, DPT [place] License # [id number]

## 2015-05-27 DIAGNOSIS — G249 Dystonia, unspecified: Secondary | ICD-10-CM | POA: Diagnosis not present

## 2015-05-27 DIAGNOSIS — R42 Dizziness and giddiness: Secondary | ICD-10-CM | POA: Diagnosis not present

## 2015-05-27 DIAGNOSIS — J410 Simple chronic bronchitis: Secondary | ICD-10-CM | POA: Diagnosis not present

## 2015-05-27 DIAGNOSIS — I10 Essential (primary) hypertension: Secondary | ICD-10-CM | POA: Diagnosis not present

## 2015-05-27 DIAGNOSIS — E119 Type 2 diabetes mellitus without complications: Secondary | ICD-10-CM | POA: Diagnosis not present

## 2015-05-27 DIAGNOSIS — J189 Pneumonia, unspecified organism: Secondary | ICD-10-CM | POA: Diagnosis not present

## 2015-05-27 DIAGNOSIS — R0602 Shortness of breath: Secondary | ICD-10-CM | POA: Diagnosis not present

## 2015-05-27 DIAGNOSIS — I5020 Unspecified systolic (congestive) heart failure: Secondary | ICD-10-CM | POA: Diagnosis not present

## 2015-05-27 DIAGNOSIS — E869 Volume depletion, unspecified: Secondary | ICD-10-CM | POA: Diagnosis not present

## 2015-05-27 DIAGNOSIS — E876 Hypokalemia: Secondary | ICD-10-CM | POA: Diagnosis not present

## 2015-05-27 DIAGNOSIS — E785 Hyperlipidemia, unspecified: Secondary | ICD-10-CM | POA: Diagnosis not present

## 2015-05-27 LAB — BASIC METABOLIC PANEL
Anion gap: 6 (ref 5–15)
BUN: 11 mg/dL (ref 6–20)
CHLORIDE: 105 mmol/L (ref 101–111)
CO2: 28 mmol/L (ref 22–32)
CREATININE: 1.09 mg/dL — AB (ref 0.44–1.00)
Calcium: 8.2 mg/dL — ABNORMAL LOW (ref 8.9–10.3)
GFR calc Af Amer: 57 mL/min — ABNORMAL LOW (ref >60)
GFR calc non Af Amer: 49 mL/min — ABNORMAL LOW (ref >60)
GLUCOSE: 101 mg/dL — AB (ref 65–99)
POTASSIUM: 3 mmol/L — AB (ref 3.5–5.1)
SODIUM: 139 mmol/L (ref 135–145)

## 2015-05-27 LAB — PROTIME-INR
INR: 2.46
Prothrombin Time: 26.4 seconds — ABNORMAL HIGH (ref 11.4–15.0)

## 2015-05-27 LAB — MAGNESIUM: MAGNESIUM: 2 mg/dL (ref 1.7–2.4)

## 2015-05-27 MED ORDER — MUPIROCIN 2 % EX OINT
1.0000 "application " | TOPICAL_OINTMENT | Freq: Two times a day (BID) | CUTANEOUS | Status: DC
  Administered 2015-05-27: 1 via NASAL
  Filled 2015-05-27: qty 22

## 2015-05-27 MED ORDER — CHLORHEXIDINE GLUCONATE CLOTH 2 % EX PADS: 6.0000 | MEDICATED_PAD | Freq: Every day | CUTANEOUS | Status: DC

## 2015-05-27 MED ORDER — LEVOFLOXACIN 750 MG PO TABS: 750.0000 mg | ORAL_TABLET | ORAL | Status: DC

## 2015-05-27 MED ORDER — POTASSIUM CHLORIDE CRYS ER 20 MEQ PO TBCR
60.0000 meq | EXTENDED_RELEASE_TABLET | Freq: Once | ORAL | Status: AC
  Administered 2015-05-27: 60 meq via ORAL
  Filled 2015-05-27: qty 3

## 2015-05-27 NOTE — Care Management Note (Signed)
Case Management Note  Patient Details  Name: [name] MRN: [id number] Date of Birth: 1940/12/18  Subjective/Objective:      Call to the [facility] office in [place]. [phone]. They report that [facility] can provide home health PT and RN services to [name] while she is in [place], [place] with her son for a week, as well as at her home in [place], [place]. A referral for home health RN and PT was faxed to [facility] in [place] Fax: [phone]. New home oxygen Alyssa be set up and portable tanks delivered by [name] at [facility].   For the next 5-7 days [name] Alyssa be living with her son [name] ph:[phone] at [address] [place], [place] [address]. Then she Alyssa return to her home address of [address], [place], [address], phone [phone].   Action/Plan:   Expected Discharge Date:                  Expected Discharge Plan:     In-House Referral:     Discharge planning Services     Post Acute Care Choice:    Choice offered to:     DME Arranged:    DME Agency:     HH Arranged:    HH Agency:     Status of Service:     Medicare Important Message Given:  Yes Date Medicare IM Given:    Medicare IM give by:    Date Additional Medicare IM Given:    Additional Medicare Important Message give by:     If discussed at Long Length of Stay Meetings, dates discussed:    Additional Comments:  Tearra Ouk A, RN 05/27/2015, 12:18 PM

## 2015-05-27 NOTE — Progress Notes (Signed)
ANTICOAGULATION CONSULT NOTE - Initial Consult  Pharmacy Consult for warfarin Indication: VTE treatment  Allergies  Allergen Reactions  . Penicillins Anaphylaxis  . Shellfish Allergy Anaphylaxis   Patient Measurements: Height: 5\' 6"  (167.6 cm) Weight: 163 lb (73.936 kg) IBW/kg (Calculated) : 59.3  Vital Signs: Temp: 99.2 F (37.3 C) (12/23 0909) Temp Source: Axillary (12/23 0909) BP: 99/53 mmHg (12/23 0909) Pulse Rate: 95 (12/23 0909)  Labs:  Recent Labs  05/25/15 0735 05/26/15 0434 05/27/15 0455  HGB 12.5 12.2  --   HCT 38.3 37.5  --   PLT 237 239  --   APTT 32  --   --   LABPROT 22.7* 26.6* 26.4*  INR 2.02 2.49 2.46  CREATININE 1.13* 1.21* 1.09*  TROPONINI <0.03  --   --     Estimated Creatinine Clearance: 47.9 mL/min (by C-G formula based on Cr of 1.09).  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 Patient's home regimen is warfarin 4 mg PO daily.   Patient is admitted with complaints of dizziness and levofloxacin was started for CAP.  Goal of Therapy:  INR 2-3 Monitor platelets by anticoagulation protocol: Yes   Plan:  Patient currently ordered warfarin 2mg  (50% of home dose) q1800. Will obtain follow-up INR with am labs.   Pharmacy will continue to monitor and adjust per consult.    Simpson,Michael L 05/27/2015,12:36 PM

## 2015-05-27 NOTE — Discharge Instructions (Signed)
Heart healthy diet. Activity as tolerated. HHPT Follow up INR.  Follow all MD discharge instructions. Take all medications as prescribed. Keep all follow up appointments. If your symptoms return, call your doctor. If you experience any new symptoms that are of concern to you or that are bothersome to you, call your doctor. For all questions and/or concerns, call your doctor.   If you have a medical emergency, call 911  Heart healthy diet. Activity as tolerated. HHPT, need home O2 [place] 2L, continuous and portable tank.

## 2015-05-27 NOTE — Progress Notes (Signed)
Pt d/c home; d/c instructions reviewed w/ pt; pt understanding was verbalized; IV removed catheter in tact, gauze dressing applied; all pt questions answered; pt left unit via wheelchair accompanied by staff 

## 2015-05-27 NOTE — Care Management Important Message (Signed)
Important Message  Patient Details  Name: [name] MRN: [id number] Date of Birth: Sep 11, 1940   Medicare Important Message Given:  Yes    Junah Yam A, RN 05/27/2015, 8:35 AM

## 2015-05-27 NOTE — Care Management Note (Addendum)
Case Management Note  Patient Details  Name: [name] MRN: [id number] Date of Birth: 22-Aug-1940  Subjective/Objective:     Call to Feliberto Gottron at [facility] requesting home health PT and RN services. Barbara Cower reports that [facility] does not service the area in which [name] lives. Pending a call back from [facility] to see whether they can provide home health RN and PT services to [name]'s home address. O2 Sats drop to 87% at rest on room air. Call to Will Anderson at [facility] to deliver a portable tank and to set up home oxygen. Will was updated that [name] will be staying at her son's home for a week after discharge prior to returning to her home address after one week. Son Caryn Bee Barba's address: [address] [place], [place] [address].  Cell: [phone].               Action/Plan:   Expected Discharge Date:                  Expected Discharge Plan:     In-House Referral:     Discharge planning Services     Post Acute Care Choice:    Choice offered to:     DME Arranged:    DME Agency:     HH Arranged:    HH Agency:     Status of Service:     Medicare Important Message Given:  Yes Date Medicare IM Given:    Medicare IM give by:    Date Additional Medicare IM Given:    Additional Medicare Important Message give by:     If discussed at Long Length of Stay Meetings, dates discussed:    Additional Comments:  Nahun Kronberg A, RN 05/27/2015, 10:11 AM

## 2015-05-27 NOTE — Discharge Summary (Signed)
[facility] [place] [facility]   PATIENT NAME: [name]    MR#:  [id number]  DATE OF BIRTH:  Aug 04, 1940  DATE OF ADMISSION:  05/25/2015 ADMITTING PHYSICIAN: Adrian Saran, MD  DATE OF DISCHARGE: 05/27/2015  3:15 PM  PRIMARY CARE PHYSICIAN: Lolita Patella, MD    ADMISSION DIAGNOSIS:  CAP (community acquired pneumonia) [J18.9] Positional lightheadedness [R42] Vomiting and diarrhea [R11.10, R19.7]   DISCHARGE DIAGNOSIS:    SECONDARY DIAGNOSIS:   Past Medical History  Diagnosis Date  . Arthritis   . Neuropathy (HCC)   . GERD (gastroesophageal reflux disease)   . Hypertension   . HLD (hyperlipidemia)   . Situs inversus with dextrocardia     CT 06/2011: situs inversus totalis.  What would typically be called RCA arose from anterior sinus of Valsalva.  What would typically be called the left main arises from the posterior sinus of Valsalva and gives rise to a large first diagonal branch and diminutive circumflex  . Dextrocardia   . Fracture 05/24/2011    right; "did not have surgery"  . DVT of leg (deep venous thrombosis) (HCC) ~2006    left  . Pneumonia 06/13/11    "couple times; long time ago"  . CAD (coronary artery disease)     NSTEMI in setting of gallstone pancreatitis 05/2011:  LHC with 3v CAD; CABG was performed 06/14/11: RIMA-LAD, SVG-ramus, SVG-RCA  . Ischemic cardiomyopathy     Echocardiogram 06/05/11: EF 40-45%, anteroseptal hypokinesis, apical hypokinesis, mild LAE  . Chronic bronchitis   . Anemia   . H/O hiatal hernia   . Chronic systolic heart failure (HCC)   . Carotid stenosis     Dopplers 06/12/11: LICA 60-79%.  . Pulmonary embolus [facility]) March 2013  . Collagen vascular disease (HCC)   . CHF (congestive heart failure) [facility])     HOSPITAL COURSE:   74 year old female with a history of CAD status post CABG, permanent emboli, C2 situ inversus with dextrocardia who presented to the emergency room with dizziness and  cough.  1. Community acquired pneumonia: Chest x-ray is consistent with community-acquired pneumonia. She has been treated with Levaquin, BP when necessary. blood culture is negative. his O2 saturation decreased to 87% without oxygen today. She needed home oxygen 2 L [place].  2. Dizziness: This is likely secondary to volume depletion. Treated with IV fluids. Hold home lasix. Per physical therapy consultation, need HHPT.  3. History of CAD status post CABG: Continue aspirin, statin and hold beta blocker.  4. History of saddle emboli and atrial mass: Continue Coumadin therapy which she will need lifelong. INR is therapeutic.  5. Chronic systolic heart failure last ejection fraction 50-55%: Hold Lasix and beta blocker due to low BP. No ACE inhibitor with history of hypotension.     DISCHARGE CONDITIONS:    the patient was discharged to home with home health and PT today.  CONSULTS OBTAINED:     DRUG ALLERGIES:   Allergies  Allergen Reactions  . Penicillins Anaphylaxis  . Shellfish Allergy Anaphylaxis    DISCHARGE MEDICATIONS:   Discharge Medication List as of 05/27/2015 11:33 AM    START taking these medications   Details  levofloxacin (LEVAQUIN) 750 MG tablet Take 1 tablet (750 mg total) by mouth every other day., Starting 05/27/2015, Until Discontinued, Print      CONTINUE these medications which have NOT CHANGED   Details  alendronate (FOSAMAX) 70 MG tablet Take 70 mg by mouth every Sunday. , Until Discontinued, Historical  Med    allopurinol (ZYLOPRIM) 100 MG tablet Take 100 mg by mouth 2 (two) times daily. , Until Discontinued, Historical Med    ALPRAZolam (XANAX) 0.5 MG tablet Take 0.5 mg by mouth 2 (two) times daily as needed., Until Discontinued, Historical Med    aspirin EC 81 MG tablet Take 81 mg by mouth daily., Until Discontinued, Historical Med    cholecalciferol (VITAMIN D) 1000 UNITS tablet Take 1,000 Units by mouth daily., Until Discontinued, Historical Med     folic acid (FOLVITE) 800 MCG tablet Take 400 mcg by mouth daily., Until Discontinued, Historical Med    gabapentin (NEURONTIN) 300 MG capsule Take 300 mg by mouth 2 (two) times daily.  , Until Discontinued, Historical Med    methotrexate (RHEUMATREX) 2.5 MG tablet Take 22.5 mg by mouth every Monday. , Until Discontinued, Historical Med    metoprolol tartrate (LOPRESSOR) 25 MG tablet Take 1 tablet (25 mg total) by mouth 2 (two) times daily., Starting 11/26/2014, Until Discontinued, Normal    omeprazole (PRILOSEC) 20 MG capsule Take 20 mg by mouth every morning.  , Until Discontinued, Historical Med    simvastatin (ZOCOR) 20 MG tablet Take 20 mg by mouth every evening. , Until Discontinued, Historical Med    warfarin (COUMADIN) 2 MG tablet Take as directed by coumadin clinic, Normal      STOP taking these medications     furosemide (LASIX) 40 MG tablet          DISCHARGE INSTRUCTIONS:    If you experience worsening of your admission symptoms, develop shortness of breath, life threatening emergency, suicidal or homicidal thoughts you must seek medical attention immediately by calling 911 or calling your MD immediately  if symptoms less severe.  You Must read complete instructions/literature along with all the possible adverse reactions/side effects for all the Medicines you take and that have been prescribed to you. Take any new Medicines after you have completely understood and accept all the possible adverse reactions/side effects.   Please note  You were cared for by a hospitalist during your hospital stay. If you have any questions about your discharge medications or the care you received while you were in the hospital after you are discharged, you can call the unit and asked to speak with the hospitalist on call if the hospitalist that took care of you is not available. Once you are discharged, your primary care physician will handle any further medical issues. Please note that  NO REFILLS for any discharge medications will be authorized once you are discharged, as it is imperative that you return to your primary care physician (or establish a relationship with a primary care physician if you do not have one) for your aftercare needs so that they can reassess your need for medications and monitor your lab values.    Today   SUBJECTIVE    weakness.    VITAL SIGNS:  Blood pressure 99/53, pulse 95, temperature 99.2 F (37.3 C), temperature source Axillary, resp. rate 17, height  (1.676 m), weight 73.936 kg (163 lb), SpO2 95 %.  I/O:   Intake/Output Summary (Last 24 hours) at 05/27/15 1718 Last data filed at 05/27/15 1200  Gross per 24 hour  Intake   1198 ml  Output   1150 ml  Net     48 ml    PHYSICAL EXAMINATION:  GENERAL:  74 y.o.-year-old patient lying in the bed with no acute distress.  EYES: Pupils equal, round, reactive to light and  accommodation. No scleral icterus. Extraocular muscles intact.  HEENT: Head atraumatic, normocephalic. Oropharynx and nasopharynx clear.  NECK:  Supple, no jugular venous distention. No thyroid enlargement, no tenderness.  LUNGS: Normal breath sounds bilaterally, no wheezing, mild crackles. No use of accessory muscles of respiration.  CARDIOVASCULAR: S1, S2 normal. No murmurs, rubs, or gallops.  ABDOMEN: Soft, non-tender, non-distended. Bowel sounds present. No organomegaly or mass.  EXTREMITIES: No pedal edema, cyanosis, or clubbing.  NEUROLOGIC: Cranial nerves II through XII are intact. Muscle strength 5/5 in all extremities. Sensation intact. Gait not checked.  PSYCHIATRIC: The patient is alert and oriented x 3.  SKIN: No obvious rash, lesion, or ulcer.   DATA REVIEW:   CBC  Recent Labs Lab 05/26/15 0434  WBC 8.9  HGB 12.2  HCT 37.5  PLT 239    Chemistries   Recent Labs Lab 05/25/15 0735  05/27/15 0455  NA 140  < > 139  K 3.1*  < > 3.0*  CL 102  < > 105  CO2 28  < > 28  GLUCOSE 127*  < >  101*  BUN 16  < > 11  CREATININE 1.13*  < > 1.09*  CALCIUM 9.0  < > 8.2*  MG  --   < > 2.0  AST 20  --   --   ALT 14  --   --   ALKPHOS 91  --   --   BILITOT 0.8  --   --   < > = values in this interval not displayed.  Cardiac Enzymes  Recent Labs Lab 05/25/15 0735  TROPONINI <0.03    Microbiology Results  Results for orders placed or performed during the hospital encounter of 05/25/15  Blood culture (routine x 2)     Status: None (Preliminary result)   Collection Time: 05/25/15 11:30 AM  Result Value Ref Range Status   Specimen Description BLOOD LEFT ASSIST CONTROL  Final   Special Requests   Final    BOTTLES DRAWN AEROBIC AND ANAEROBIC  3CC ANAERO 10CC AERO   Culture NO GROWTH 2 DAYS  Final   Report Status PENDING  Incomplete  Blood culture (routine x 2)     Status: None (Preliminary result)   Collection Time: 05/25/15 11:30 AM  Result Value Ref Range Status   Specimen Description BLOOD LEFT HAND  Final   Special Requests   Final    BOTTLES DRAWN AEROBIC AND ANAEROBIC 3CC ANAERO 1 CC AERO   Culture NO GROWTH 2 DAYS  Final   Report Status PENDING  Incomplete  MRSA PCR Screening     Status: Abnormal   Collection Time: 05/26/15  3:16 PM  Result Value Ref Range Status   MRSA by PCR POSITIVE (A) NEGATIVE Final    Comment: CRITICAL RESULT CALLED TO, READ BACK BY AND VERIFIED WITH: Adelina Mings @ 1703 ON 05/26/2015 BY CAF        The GeneXpert MRSA Assay (FDA approved for NASAL specimens only), is one component of a comprehensive MRSA colonization surveillance program. It is not intended to diagnose MRSA infection nor to guide or monitor treatment for MRSA infections.     RADIOLOGY:  No results found.      Management plans discussed with the patient, family and they are in agreement.  CODE STATUS:     Code Status Orders        Start     Ordered   05/25/15 1357  Do not attempt resuscitation (DNR)  Continuous    Question Answer Comment  In the event  of cardiac or respiratory ARREST Do not call a "code blue"   In the event of cardiac or respiratory ARREST Do not perform Intubation, CPR, defibrillation or ACLS   In the event of cardiac or respiratory ARREST Use medication by any route, position, wound care, and other measures to relive pain and suffering. May use oxygen, suction and manual treatment of airway obstruction as needed for comfort.      05/25/15 1356    Advance Directive Documentation        Most Recent Value   Type of Advance Directive  Healthcare Power of Attorney   Pre-existing out of facility DNR order (yellow form or pink MOST form)     "MOST" Form in Place?        TOTAL TIME TAKING CARE OF THIS PATIENT: 33 minutes.    Shaune Pollack M.D on 05/27/2015 at 5:18 PM  Between 7am to 6pm - Pager - [phone]  After 6pm go to [email or web address] - password [email or web address] [facility]  [facility]  Office  [phone]  CC: Primary care physician; Lolita Patella, MD

## 2015-05-27 NOTE — Progress Notes (Signed)
ANTIBIOTIC CONSULT NOTE - Follow-Up  Pharmacy Consult for levofloxacin Indication: CAP  Allergies  Allergen Reactions  . Penicillins Anaphylaxis  . Shellfish Allergy Anaphylaxis    Patient Measurements: Height: 5\' 6"  (167.6 cm) Weight: 163 lb (73.936 kg) IBW/kg (Calculated) : 59.3  Vital Signs: Temp: 99.2 F (37.3 C) (12/23 0909) Temp Source: Axillary (12/23 0909) BP: 99/53 mmHg (12/23 0909) Pulse Rate: 95 (12/23 0909) Intake/Output from previous day: 12/22 0701 - 12/23 0700 In: 3095.9 [P.O.:1760; I.V.:1335.9] Out: 1750 [Urine:1750] Intake/Output from this shift: Total I/O In: 333 [I.V.:333] Out: 0   Labs:  Recent Labs  05/25/15 0735 05/26/15 0434 05/27/15 0455  WBC 8.8 8.9  --   HGB 12.5 12.2  --   PLT 237 239  --   CREATININE 1.13* 1.21* 1.09*   Estimated Creatinine Clearance: 47.9 mL/min (by C-G formula based on Cr of 1.09). No results for input(s): VANCOTROUGH, VANCOPEAK, VANCORANDOM, GENTTROUGH, GENTPEAK, GENTRANDOM, TOBRATROUGH, TOBRAPEAK, TOBRARND, AMIKACINPEAK, AMIKACINTROU, AMIKACIN in the last 72 hours.   Microbiology: Recent Results (from the past 720 hour(s))  Blood culture (routine x 2)     Status: None (Preliminary result)   Collection Time: 05/25/15 11:30 AM  Result Value Ref Range Status   Specimen Description BLOOD LEFT ASSIST CONTROL  Final   Special Requests   Final    BOTTLES DRAWN AEROBIC AND ANAEROBIC  3CC ANAERO 10CC AERO   Culture NO GROWTH 2 DAYS  Final   Report Status PENDING  Incomplete  Blood culture (routine x 2)     Status: None (Preliminary result)   Collection Time: 05/25/15 11:30 AM  Result Value Ref Range Status   Specimen Description BLOOD LEFT HAND  Final   Special Requests   Final    BOTTLES DRAWN AEROBIC AND ANAEROBIC 3CC ANAERO 1 CC AERO   Culture NO GROWTH 2 DAYS  Final   Report Status PENDING  Incomplete  MRSA PCR Screening     Status: Abnormal   Collection Time: 05/26/15  3:16 PM  Result Value Ref Range Status    MRSA by PCR POSITIVE (A) NEGATIVE Final    Comment: CRITICAL RESULT CALLED TO, READ BACK BY AND VERIFIED WITH: 05/28/15 @ 1703 ON 05/26/2015 BY CAF        The GeneXpert MRSA Assay (FDA approved for NASAL specimens only), is one component of a comprehensive MRSA colonization surveillance program. It is not intended to diagnose MRSA infection nor to guide or monitor treatment for MRSA infections.     Medical History: Past Medical History  Diagnosis Date  . Arthritis   . Neuropathy (HCC)   . GERD (gastroesophageal reflux disease)   . Hypertension   . HLD (hyperlipidemia)   . Situs inversus with dextrocardia     CT 06/2011: situs inversus totalis.  What would typically be called RCA arose from anterior sinus of Valsalva.  What would typically be called the left main arises from the posterior sinus of Valsalva and gives rise to a large first diagonal branch and diminutive circumflex  . Dextrocardia   . Fracture 05/24/2011    right; "did not have surgery"  . DVT of leg (deep venous thrombosis) (HCC) ~2006    left  . Pneumonia 06/13/11    "couple times; long time ago"  . CAD (coronary artery disease)     NSTEMI in setting of gallstone pancreatitis 05/2011:  LHC with 3v CAD; CABG was performed 06/14/11: RIMA-LAD, SVG-ramus, SVG-RCA  . Ischemic cardiomyopathy     Echocardiogram  06/05/11: EF 40-45%, anteroseptal hypokinesis, apical hypokinesis, mild LAE  . Chronic bronchitis   . Anemia   . H/O hiatal hernia   . Chronic systolic heart failure (HCC)   . Carotid stenosis     Dopplers 06/12/11: LICA 60-79%.  . Pulmonary embolus [facility]) March 2013  . Collagen vascular disease (HCC)   . CHF (congestive heart failure) (HCC)     Assessment: Pharmacy consulted to dose levofloxacin for CAP in this 74 year old female. Patient is currently on day 3 of levofloxacin 750mg  Q48hr.   CrCl ~ 48 mL/min  Plan:  Will continue patient on levofloxacin 750mg  PO Q48hr.   Pharmacy will continue to  monitor and adjust per consult.    [name] 05/27/2015,12:42 PM

## 2015-05-28 DIAGNOSIS — Z8701 Personal history of pneumonia (recurrent): Secondary | ICD-10-CM | POA: Diagnosis not present

## 2015-05-28 DIAGNOSIS — I110 Hypertensive heart disease with heart failure: Secondary | ICD-10-CM | POA: Diagnosis not present

## 2015-05-28 DIAGNOSIS — I5022 Chronic systolic (congestive) heart failure: Secondary | ICD-10-CM | POA: Diagnosis not present

## 2015-05-28 DIAGNOSIS — Z86711 Personal history of pulmonary embolism: Secondary | ICD-10-CM | POA: Diagnosis not present

## 2015-05-28 DIAGNOSIS — R42 Dizziness and giddiness: Secondary | ICD-10-CM | POA: Diagnosis not present

## 2015-05-28 DIAGNOSIS — R262 Difficulty in walking, not elsewhere classified: Secondary | ICD-10-CM | POA: Diagnosis not present

## 2015-05-28 DIAGNOSIS — I2510 Atherosclerotic heart disease of native coronary artery without angina pectoris: Secondary | ICD-10-CM | POA: Diagnosis not present

## 2015-05-30 LAB — BLOOD CULTURE ID PANEL (REFLEXED)
Acinetobacter baumannii: NOT DETECTED
CANDIDA GLABRATA: NOT DETECTED
CANDIDA KRUSEI: NOT DETECTED
CANDIDA TROPICALIS: NOT DETECTED
CARBAPENEM RESISTANCE: NOT DETECTED
Candida albicans: NOT DETECTED
Candida parapsilosis: NOT DETECTED
ENTEROBACTER CLOACAE COMPLEX: NOT DETECTED
ESCHERICHIA COLI: NOT DETECTED
Enterobacteriaceae species: NOT DETECTED
Enterococcus species: NOT DETECTED
Haemophilus influenzae: NOT DETECTED
KLEBSIELLA PNEUMONIAE: NOT DETECTED
Klebsiella oxytoca: NOT DETECTED
Listeria monocytogenes: NOT DETECTED
Methicillin resistance: NOT DETECTED
NEISSERIA MENINGITIDIS: NOT DETECTED
PROTEUS SPECIES: NOT DETECTED
Pseudomonas aeruginosa: NOT DETECTED
STREPTOCOCCUS PNEUMONIAE: NOT DETECTED
STREPTOCOCCUS PYOGENES: NOT DETECTED
Serratia marcescens: NOT DETECTED
Staphylococcus aureus (BCID): NOT DETECTED
Staphylococcus species: NOT DETECTED
Streptococcus agalactiae: NOT DETECTED
Streptococcus species: NOT DETECTED
Vancomycin resistance: NOT DETECTED

## 2015-05-30 LAB — CULTURE, BLOOD (ROUTINE X 2): CULTURE: NO GROWTH

## 2015-05-31 DIAGNOSIS — R42 Dizziness and giddiness: Secondary | ICD-10-CM | POA: Diagnosis not present

## 2015-05-31 DIAGNOSIS — I110 Hypertensive heart disease with heart failure: Secondary | ICD-10-CM | POA: Diagnosis not present

## 2015-05-31 DIAGNOSIS — I2510 Atherosclerotic heart disease of native coronary artery without angina pectoris: Secondary | ICD-10-CM | POA: Diagnosis not present

## 2015-05-31 DIAGNOSIS — Z8701 Personal history of pneumonia (recurrent): Secondary | ICD-10-CM | POA: Diagnosis not present

## 2015-05-31 DIAGNOSIS — Z86711 Personal history of pulmonary embolism: Secondary | ICD-10-CM | POA: Diagnosis not present

## 2015-05-31 DIAGNOSIS — R262 Difficulty in walking, not elsewhere classified: Secondary | ICD-10-CM | POA: Diagnosis not present

## 2015-05-31 DIAGNOSIS — I5022 Chronic systolic (congestive) heart failure: Secondary | ICD-10-CM | POA: Diagnosis not present

## 2015-06-01 DIAGNOSIS — J181 Lobar pneumonia, unspecified organism: Secondary | ICD-10-CM | POA: Diagnosis not present

## 2015-06-02 LAB — CULTURE, BLOOD (ROUTINE X 2)

## 2015-06-03 DIAGNOSIS — R42 Dizziness and giddiness: Secondary | ICD-10-CM | POA: Diagnosis not present

## 2015-06-03 DIAGNOSIS — Z86711 Personal history of pulmonary embolism: Secondary | ICD-10-CM | POA: Diagnosis not present

## 2015-06-03 DIAGNOSIS — I110 Hypertensive heart disease with heart failure: Secondary | ICD-10-CM | POA: Diagnosis not present

## 2015-06-03 DIAGNOSIS — I2510 Atherosclerotic heart disease of native coronary artery without angina pectoris: Secondary | ICD-10-CM | POA: Diagnosis not present

## 2015-06-03 DIAGNOSIS — I5022 Chronic systolic (congestive) heart failure: Secondary | ICD-10-CM | POA: Diagnosis not present

## 2015-06-03 DIAGNOSIS — Z8701 Personal history of pneumonia (recurrent): Secondary | ICD-10-CM | POA: Diagnosis not present

## 2015-06-03 DIAGNOSIS — R262 Difficulty in walking, not elsewhere classified: Secondary | ICD-10-CM | POA: Diagnosis not present

## 2015-06-07 ENCOUNTER — Ambulatory Visit: Payer: Commercial Managed Care - HMO

## 2015-06-08 DIAGNOSIS — I2510 Atherosclerotic heart disease of native coronary artery without angina pectoris: Secondary | ICD-10-CM | POA: Diagnosis not present

## 2015-06-08 DIAGNOSIS — Z8701 Personal history of pneumonia (recurrent): Secondary | ICD-10-CM | POA: Diagnosis not present

## 2015-06-08 DIAGNOSIS — I110 Hypertensive heart disease with heart failure: Secondary | ICD-10-CM | POA: Diagnosis not present

## 2015-06-08 DIAGNOSIS — R262 Difficulty in walking, not elsewhere classified: Secondary | ICD-10-CM | POA: Diagnosis not present

## 2015-06-08 DIAGNOSIS — R42 Dizziness and giddiness: Secondary | ICD-10-CM | POA: Diagnosis not present

## 2015-06-08 DIAGNOSIS — Z86711 Personal history of pulmonary embolism: Secondary | ICD-10-CM | POA: Diagnosis not present

## 2015-06-08 DIAGNOSIS — I5022 Chronic systolic (congestive) heart failure: Secondary | ICD-10-CM | POA: Diagnosis not present

## 2015-06-10 DIAGNOSIS — Z86711 Personal history of pulmonary embolism: Secondary | ICD-10-CM | POA: Diagnosis not present

## 2015-06-10 DIAGNOSIS — I2510 Atherosclerotic heart disease of native coronary artery without angina pectoris: Secondary | ICD-10-CM | POA: Diagnosis not present

## 2015-06-10 DIAGNOSIS — Z8701 Personal history of pneumonia (recurrent): Secondary | ICD-10-CM | POA: Diagnosis not present

## 2015-06-10 DIAGNOSIS — R42 Dizziness and giddiness: Secondary | ICD-10-CM | POA: Diagnosis not present

## 2015-06-10 DIAGNOSIS — R262 Difficulty in walking, not elsewhere classified: Secondary | ICD-10-CM | POA: Diagnosis not present

## 2015-06-10 DIAGNOSIS — I110 Hypertensive heart disease with heart failure: Secondary | ICD-10-CM | POA: Diagnosis not present

## 2015-06-10 DIAGNOSIS — I5022 Chronic systolic (congestive) heart failure: Secondary | ICD-10-CM | POA: Diagnosis not present

## 2015-06-13 DIAGNOSIS — R42 Dizziness and giddiness: Secondary | ICD-10-CM | POA: Diagnosis not present

## 2015-06-13 DIAGNOSIS — I110 Hypertensive heart disease with heart failure: Secondary | ICD-10-CM | POA: Diagnosis not present

## 2015-06-13 DIAGNOSIS — I5022 Chronic systolic (congestive) heart failure: Secondary | ICD-10-CM | POA: Diagnosis not present

## 2015-06-13 DIAGNOSIS — R262 Difficulty in walking, not elsewhere classified: Secondary | ICD-10-CM | POA: Diagnosis not present

## 2015-06-13 DIAGNOSIS — Z8701 Personal history of pneumonia (recurrent): Secondary | ICD-10-CM | POA: Diagnosis not present

## 2015-06-13 DIAGNOSIS — I2510 Atherosclerotic heart disease of native coronary artery without angina pectoris: Secondary | ICD-10-CM | POA: Diagnosis not present

## 2015-06-13 DIAGNOSIS — Z86711 Personal history of pulmonary embolism: Secondary | ICD-10-CM | POA: Diagnosis not present

## 2015-06-20 DIAGNOSIS — I110 Hypertensive heart disease with heart failure: Secondary | ICD-10-CM | POA: Diagnosis not present

## 2015-06-20 DIAGNOSIS — I2510 Atherosclerotic heart disease of native coronary artery without angina pectoris: Secondary | ICD-10-CM | POA: Diagnosis not present

## 2015-06-20 DIAGNOSIS — Z8701 Personal history of pneumonia (recurrent): Secondary | ICD-10-CM | POA: Diagnosis not present

## 2015-06-20 DIAGNOSIS — R42 Dizziness and giddiness: Secondary | ICD-10-CM | POA: Diagnosis not present

## 2015-06-20 DIAGNOSIS — I5022 Chronic systolic (congestive) heart failure: Secondary | ICD-10-CM | POA: Diagnosis not present

## 2015-06-20 DIAGNOSIS — Z86711 Personal history of pulmonary embolism: Secondary | ICD-10-CM | POA: Diagnosis not present

## 2015-06-20 DIAGNOSIS — R262 Difficulty in walking, not elsewhere classified: Secondary | ICD-10-CM | POA: Diagnosis not present

## 2015-06-22 ENCOUNTER — Ambulatory Visit (INDEPENDENT_AMBULATORY_CARE_PROVIDER_SITE_OTHER): Payer: Commercial Managed Care - HMO | Admitting: *Deleted

## 2015-06-22 DIAGNOSIS — I2692 Saddle embolus of pulmonary artery without acute cor pulmonale: Secondary | ICD-10-CM | POA: Diagnosis not present

## 2015-06-22 DIAGNOSIS — Z5181 Encounter for therapeutic drug level monitoring: Secondary | ICD-10-CM

## 2015-06-22 DIAGNOSIS — I2510 Atherosclerotic heart disease of native coronary artery without angina pectoris: Secondary | ICD-10-CM

## 2015-06-22 LAB — POCT INR: INR: 4.1

## 2015-06-23 DIAGNOSIS — Z86711 Personal history of pulmonary embolism: Secondary | ICD-10-CM | POA: Diagnosis not present

## 2015-06-23 DIAGNOSIS — Z8701 Personal history of pneumonia (recurrent): Secondary | ICD-10-CM | POA: Diagnosis not present

## 2015-06-23 DIAGNOSIS — I2510 Atherosclerotic heart disease of native coronary artery without angina pectoris: Secondary | ICD-10-CM | POA: Diagnosis not present

## 2015-06-23 DIAGNOSIS — R262 Difficulty in walking, not elsewhere classified: Secondary | ICD-10-CM | POA: Diagnosis not present

## 2015-06-23 DIAGNOSIS — R42 Dizziness and giddiness: Secondary | ICD-10-CM | POA: Diagnosis not present

## 2015-06-23 DIAGNOSIS — I110 Hypertensive heart disease with heart failure: Secondary | ICD-10-CM | POA: Diagnosis not present

## 2015-06-23 DIAGNOSIS — I5022 Chronic systolic (congestive) heart failure: Secondary | ICD-10-CM | POA: Diagnosis not present

## 2015-06-27 DIAGNOSIS — J410 Simple chronic bronchitis: Secondary | ICD-10-CM | POA: Diagnosis not present

## 2015-06-27 DIAGNOSIS — R0602 Shortness of breath: Secondary | ICD-10-CM | POA: Diagnosis not present

## 2015-06-27 DIAGNOSIS — I5020 Unspecified systolic (congestive) heart failure: Secondary | ICD-10-CM | POA: Diagnosis not present

## 2015-06-28 ENCOUNTER — Ambulatory Visit
Admission: RE | Admit: 2015-06-28 | Discharge: 2015-06-28 | Disposition: A | Discharge status: Home / Self Care | Payer: Commercial Managed Care - HMO | Source: Ambulatory Visit | Attending: Family Medicine | Admitting: Family Medicine

## 2015-06-28 ENCOUNTER — Other Ambulatory Visit: Payer: Self-pay | Admitting: Family Medicine

## 2015-06-28 DIAGNOSIS — J189 Pneumonia, unspecified organism: Secondary | ICD-10-CM | POA: Diagnosis not present

## 2015-06-28 DIAGNOSIS — Z0000 Encounter for general adult medical examination without abnormal findings: Secondary | ICD-10-CM | POA: Diagnosis not present

## 2015-06-28 DIAGNOSIS — Z86711 Personal history of pulmonary embolism: Secondary | ICD-10-CM | POA: Diagnosis not present

## 2015-06-28 DIAGNOSIS — I2510 Atherosclerotic heart disease of native coronary artery without angina pectoris: Secondary | ICD-10-CM | POA: Diagnosis not present

## 2015-06-28 DIAGNOSIS — Z1389 Encounter for screening for other disorder: Secondary | ICD-10-CM | POA: Diagnosis not present

## 2015-06-28 DIAGNOSIS — Z8701 Personal history of pneumonia (recurrent): Secondary | ICD-10-CM | POA: Diagnosis not present

## 2015-06-28 DIAGNOSIS — R42 Dizziness and giddiness: Secondary | ICD-10-CM | POA: Diagnosis not present

## 2015-06-28 DIAGNOSIS — R262 Difficulty in walking, not elsewhere classified: Secondary | ICD-10-CM | POA: Diagnosis not present

## 2015-06-28 DIAGNOSIS — I110 Hypertensive heart disease with heart failure: Secondary | ICD-10-CM | POA: Diagnosis not present

## 2015-06-28 DIAGNOSIS — I5022 Chronic systolic (congestive) heart failure: Secondary | ICD-10-CM | POA: Diagnosis not present

## 2015-06-30 ENCOUNTER — Other Ambulatory Visit (HOSPITAL_COMMUNITY): Payer: Commercial Managed Care - HMO

## 2015-07-06 ENCOUNTER — Ambulatory Visit
Admission: RE | Admit: 2015-07-06 | Discharge: 2015-07-06 | Disposition: A | Discharge status: Home / Self Care | Payer: Commercial Managed Care - HMO | Source: Ambulatory Visit | Attending: Family Medicine | Admitting: Family Medicine

## 2015-07-06 ENCOUNTER — Ambulatory Visit (INDEPENDENT_AMBULATORY_CARE_PROVIDER_SITE_OTHER): Payer: Commercial Managed Care - HMO | Admitting: Pharmacist

## 2015-07-06 DIAGNOSIS — Z5181 Encounter for therapeutic drug level monitoring: Secondary | ICD-10-CM

## 2015-07-06 DIAGNOSIS — I2510 Atherosclerotic heart disease of native coronary artery without angina pectoris: Secondary | ICD-10-CM

## 2015-07-06 DIAGNOSIS — I2692 Saddle embolus of pulmonary artery without acute cor pulmonale: Secondary | ICD-10-CM | POA: Diagnosis not present

## 2015-07-06 DIAGNOSIS — Z1231 Encounter for screening mammogram for malignant neoplasm of breast: Secondary | ICD-10-CM

## 2015-07-06 LAB — POCT INR: INR: 2.9

## 2015-07-08 DIAGNOSIS — I110 Hypertensive heart disease with heart failure: Secondary | ICD-10-CM | POA: Diagnosis not present

## 2015-07-08 DIAGNOSIS — R262 Difficulty in walking, not elsewhere classified: Secondary | ICD-10-CM | POA: Diagnosis not present

## 2015-07-08 DIAGNOSIS — Z86711 Personal history of pulmonary embolism: Secondary | ICD-10-CM | POA: Diagnosis not present

## 2015-07-08 DIAGNOSIS — I5022 Chronic systolic (congestive) heart failure: Secondary | ICD-10-CM | POA: Diagnosis not present

## 2015-07-08 DIAGNOSIS — I2510 Atherosclerotic heart disease of native coronary artery without angina pectoris: Secondary | ICD-10-CM | POA: Diagnosis not present

## 2015-07-08 DIAGNOSIS — Z8701 Personal history of pneumonia (recurrent): Secondary | ICD-10-CM | POA: Diagnosis not present

## 2015-07-08 DIAGNOSIS — R42 Dizziness and giddiness: Secondary | ICD-10-CM | POA: Diagnosis not present

## 2015-07-13 DIAGNOSIS — R42 Dizziness and giddiness: Secondary | ICD-10-CM | POA: Diagnosis not present

## 2015-07-13 DIAGNOSIS — I5022 Chronic systolic (congestive) heart failure: Secondary | ICD-10-CM | POA: Diagnosis not present

## 2015-07-13 DIAGNOSIS — I2510 Atherosclerotic heart disease of native coronary artery without angina pectoris: Secondary | ICD-10-CM | POA: Diagnosis not present

## 2015-07-13 DIAGNOSIS — Z86711 Personal history of pulmonary embolism: Secondary | ICD-10-CM | POA: Diagnosis not present

## 2015-07-13 DIAGNOSIS — Z8701 Personal history of pneumonia (recurrent): Secondary | ICD-10-CM | POA: Diagnosis not present

## 2015-07-13 DIAGNOSIS — I110 Hypertensive heart disease with heart failure: Secondary | ICD-10-CM | POA: Diagnosis not present

## 2015-07-13 DIAGNOSIS — R262 Difficulty in walking, not elsewhere classified: Secondary | ICD-10-CM | POA: Diagnosis not present

## 2015-07-28 DIAGNOSIS — R0602 Shortness of breath: Secondary | ICD-10-CM | POA: Diagnosis not present

## 2015-07-28 DIAGNOSIS — J410 Simple chronic bronchitis: Secondary | ICD-10-CM | POA: Diagnosis not present

## 2015-07-28 DIAGNOSIS — I5020 Unspecified systolic (congestive) heart failure: Secondary | ICD-10-CM | POA: Diagnosis not present

## 2015-08-08 ENCOUNTER — Encounter: Payer: Self-pay | Admitting: Cardiovascular Disease

## 2015-08-08 ENCOUNTER — Ambulatory Visit (INDEPENDENT_AMBULATORY_CARE_PROVIDER_SITE_OTHER): Payer: Commercial Managed Care - HMO | Admitting: Cardiovascular Disease

## 2015-08-08 ENCOUNTER — Ambulatory Visit (INDEPENDENT_AMBULATORY_CARE_PROVIDER_SITE_OTHER): Payer: Commercial Managed Care - HMO | Admitting: Pharmacist

## 2015-08-08 VITALS — BP 120/74 | HR 94 | Ht 66.0 in | Wt 162.0 lb

## 2015-08-08 DIAGNOSIS — R222 Localized swelling, mass and lump, trunk: Secondary | ICD-10-CM

## 2015-08-08 DIAGNOSIS — I2692 Saddle embolus of pulmonary artery without acute cor pulmonale: Secondary | ICD-10-CM

## 2015-08-08 DIAGNOSIS — I5189 Other ill-defined heart diseases: Secondary | ICD-10-CM

## 2015-08-08 DIAGNOSIS — I255 Ischemic cardiomyopathy: Secondary | ICD-10-CM

## 2015-08-08 DIAGNOSIS — I2510 Atherosclerotic heart disease of native coronary artery without angina pectoris: Secondary | ICD-10-CM | POA: Diagnosis not present

## 2015-08-08 DIAGNOSIS — I6523 Occlusion and stenosis of bilateral carotid arteries: Secondary | ICD-10-CM

## 2015-08-08 DIAGNOSIS — Z5181 Encounter for therapeutic drug level monitoring: Secondary | ICD-10-CM

## 2015-08-08 DIAGNOSIS — I2782 Chronic pulmonary embolism: Secondary | ICD-10-CM

## 2015-08-08 LAB — POCT INR: INR: 2.2

## 2015-08-08 MED ORDER — WARFARIN SODIUM 2 MG PO TABS: ORAL_TABLET | ORAL | Status: DC

## 2015-08-08 NOTE — Progress Notes (Signed)
Chief Complaint  Patient presents with  . Follow-up  . Coronary Artery Disease    History of Present Illness: 75 yo female with history of CAD s/p CABG, PE, carotid artery disease who is here today for cardiac follow up. She was admitted 12/28-1/22/13 with symptoms of pancreatitis. Diagnosed with cholelithiasis and underwent ERCP with sphincterotomy and stone extraction. Following this, she developed pulmonary edema and ruled in for an NSTEMI. Stress testing was undertaken and demonstrated an EF 20% and situs inversus which limited interpretation of the study. LHC was performed 06/01/11. This confirmed dextrocardia. 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 EF was 40-45% by cardiac catheterization. Echocardiogram 06/05/11: EF 40-45%, anteroseptal hypokinesis, apical hypokinesis, mild LAE. CT scan was recommended and performed 06/12/11. This demonstrated situs inversus totalis. What would typically be called RCA arose from anterior sinus of Valsalva. What would typically be called the left main arises from the posterior sinus of Valsalva and gives rise to a large first diagonal branch and diminutive circumflex. She was then seen by Dr. Laneta Simmers of [facility]. CABG was recommended. Gen. Surgery felt that cholecystectomy could be done in the future as cardiac risk outweighed further intervention for her gallbladder. CABG was performed 06/14/11: RIMA-LAD, SVG-ramus, SVG-RCA. Pre-CABG Dopplers 06/12/11: LICA 60-79%. I saw her in March 2013 and she was c/o dyspnea. I discussed a CTA chest to exclude PE and a CXR but she refused. She was admitted March 10,2013 to [facility] with right-sided pleuritic chest pain and dyspnea. She underwent CT scanning which showed a large saddle embolus. Echocardiogram showed right heart strain and a possible mass in the left atrium as well as moderate sized pericardial effusion, LVEF 50-55%.  She did not meet criteria for thrombolysis. Bilateral lower extremity dopplers were positive for subacute DVT noted throughout the right lower extremity. There was acute, occlusive DVT noted in the posterior tibial vein of the left lower extremity. She was started on heparin and coumadin. She continued to have high O2 requirements and decision was made to place IVC filter. Hospital course was complicated by volume overload in setting of RV strain secondary to PE / diastolic failure. She was aggressively diuresed to achieve negative balance. 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 Repeat carotid dopplers 06/03/14 40-59% RICA stenosis, stable 60-79% LICA stenosis. Repeat echo 04/22/12 with normal LV size and function with mild valve disease and no pericardial effusion. She had her cholecystectomy in December 2013. Admitted to [facility] December 2014 with chest pain and she was treated for pneumonia.  She has recently been treated for bronchitis and had been on prednisone and antibiotics when I saw her 11/26/14. She also c/o chest pain and SOB. Stress myoview 12/13/14 with no ischemia, small apical fixed defect.   She is here today for cardiac follow up.  She is feeling well overall. No chest pain or dyspnea.   Primary Care Physician: Elias Else  Last Lipid Profile: Followed in primary care  Past Medical History  Diagnosis Date  . Arthritis   . Neuropathy (HCC)   . GERD (gastroesophageal reflux disease)   . Hypertension   . HLD (hyperlipidemia)   . Situs inversus with dextrocardia     CT 06/2011: situs inversus totalis.  What would typically be called RCA arose from anterior sinus of Valsalva.  What would  typically be called the left main arises from the posterior sinus of Valsalva and gives rise to a large first diagonal branch and diminutive circumflex  . Dextrocardia   . Fracture 05/24/2011    right; "did not have  surgery"  . DVT of leg (deep venous thrombosis) (HCC) ~2006    left  . Pneumonia 06/13/11    "couple times; long time ago"  . CAD (coronary artery disease)     NSTEMI in setting of gallstone pancreatitis 05/2011:  LHC with 3v CAD; CABG was performed 06/14/11: RIMA-LAD, SVG-ramus, SVG-RCA  . Ischemic cardiomyopathy     Echocardiogram 06/05/11: EF 40-45%, anteroseptal hypokinesis, apical hypokinesis, mild LAE  . Chronic bronchitis   . Anemia   . H/O hiatal hernia   . Chronic systolic heart failure (HCC)   . Carotid stenosis     Dopplers 06/12/11: LICA 60-79%.  . Pulmonary embolus [facility]) March 2013  . Collagen vascular disease (HCC)   . CHF (congestive heart failure) [facility])     Past Surgical History  Procedure Laterality Date  . Ercp  06/03/2011    Procedure: ENDOSCOPIC RETROGRADE CHOLANGIOPANCREATOGRAPHY (ERCP);  Surgeon: Petra Kuba, MD;  Location: [facility] OR;  Service: Endoscopy;  Laterality: N/A;  . Vaginal hysterectomy  1970's  . Cardiac catheterization  06/11/11  . Coronary artery bypass graft  06/14/2011    Procedure: CORONARY ARTERY BYPASS GRAFTING (CABG);  Surgeon: Alleen Borne, MD;  Location: [facility] OR;  Service: Open Heart Surgery;  Laterality: N/A;  . Left heart catheterization with coronary angiogram N/A 06/11/2011    Procedure: LEFT HEART CATHETERIZATION WITH CORONARY ANGIOGRAM;  Surgeon: Kathleene Hazel, MD;  Location: [facility] CATH LAB;  Service: Cardiovascular;  Laterality: N/A;  . Arch aortogram Right 06/11/2011    Procedure: ARCH AORTOGRAM;  Surgeon: Kathleene Hazel, MD;  Location: [facility] CATH LAB;  Service: Cardiovascular;  Laterality: Right;  . Cholecystectomy      Current Outpatient Prescriptions  Medication Sig Dispense Refill  . alendronate (FOSAMAX) 70 MG tablet Take 70 mg by mouth every Sunday.     Marland Kitchen allopurinol (ZYLOPRIM) 100 MG tablet Take 100 mg by mouth 2 (two) times daily.     Marland Kitchen aspirin EC 81 MG tablet Take 81 mg by mouth daily.    . cholecalciferol (VITAMIN D) 1000  UNITS tablet Take 1,000 Units by mouth daily.    . folic acid (FOLVITE) 800 MCG tablet Take 400 mcg by mouth daily.    Marland Kitchen gabapentin (NEURONTIN) 300 MG capsule Take 300 mg by mouth 2 (two) times daily.      . methotrexate (RHEUMATREX) 2.5 MG tablet Take 22.5 mg by mouth every Monday.     . metoprolol tartrate (LOPRESSOR) 25 MG tablet Take 1 tablet (25 mg total) by mouth 2 (two) times daily. 180 tablet 3  . omeprazole (PRILOSEC) 20 MG capsule Take 20 mg by mouth every morning.      . simvastatin (ZOCOR) 20 MG tablet Take 20 mg by mouth every evening.     . warfarin (COUMADIN) 2 MG tablet Take 2-3 tablets daily as directed by coumadin clinic 65 tablet 3   No current facility-administered medications for this visit.    Allergies  Allergen Reactions  . Penicillins Anaphylaxis  . Shellfish Allergy Anaphylaxis  . Levaquin [Levofloxacin] Hives    Social History   Social History  . Marital Status: Widowed    Spouse Name: N/A  . Number of Children: N/A  . Years of Education: N/A  Occupational History  . Not on file.   Social History Main Topics  . Smoking status: Never Smoker   . Smokeless tobacco: Never Used  . Alcohol Use: No  . Drug Use: No  . Sexual Activity: No   Other Topics Concern  . Not on file   Social History Narrative    No family history on file.  Review of Systems:  As stated in the HPI and otherwise negative.   BP 120/74 mmHg  Pulse 94  Ht 5\' 6"  (1.676 m)  Wt 162 lb (73.483 kg)  BMI 26.16 kg/m2  SpO2 99%  Physical Examination: General: Well developed, well nourished, NAD HEENT: OP clear, mucus membranes moist SKIN: warm, dry. No rashes. Neuro: No focal deficits Musculoskeletal: Muscle strength 5/5 all ext Psychiatric: Mood and affect normal Neck: No JVD, no carotid bruits, no thyromegaly, no lymphadenopathy. Lungs:Clear bilaterally, no wheezes, rhonci, crackles Cardiovascular: Regular rate and rhythm. No murmurs, gallops or rubs. Abdomen:Soft.  Bowel sounds present. Non-tender.  Extremities: No lower extremity edema. Pulses are 2 + in the bilateral DP/PT.  Carotid artery dopplers: 06/03/14 40-59% RICA stenosis, 60-79% LICA stenosis.   Echo 04/22/12: Left ventricle: The cavity size was normal. Wall thickness was normal. Systolic function was normal. The estimated ejection fraction was in the range of 55% to 60%. Wall motion was normal; there were no regional wall motion abnormalities. Doppler parameters are consistent with abnormal left ventricular relaxation (grade 1 diastolic dysfunction). - Aortic valve: There was no stenosis. - Mitral valve: Mild regurgitation. - Right ventricle: The cavity size was normal. Systolic function was normal. - Tricuspid valve: Peak RV-RA gradient: 23mm Hg (S). - Pulmonary arteries: PA peak pressure: 64mm Hg (S). - Inferior vena cava: The vessel was normal in size; the respirophasic diameter changes were in the normal range (= 50%); findings are consistent with normal central venous pressure. - Pericardium, extracardiac: There was no pericardial effusion. Impressions:  - Normal LV size and systolic function, EF 55-60%. Normal RV size and systolic function. Mild mitral regurgitation.  EKG:  EKG is not ordered today. The ekg ordered today demonstrates  Recent Labs: 05/25/2015: ALT 14 05/26/2015: Hemoglobin 12.2; Platelets 239 05/27/2015: BUN 11; Creatinine, Ser 1.09*; Magnesium 2.0; Potassium 3.0*; Sodium 139   Lipid Panel    Component Value Date/Time   CHOL 124 06/14/2011 0400   TRIG 91 06/14/2011 0400   HDL 36* 06/14/2011 0400   CHOLHDL 3.4 06/14/2011 0400   VLDL 18 06/14/2011 0400   LDLCALC 70 06/14/2011 0400     Wt Readings from Last 3 Encounters:  08/08/15 162 lb (73.483 kg)  05/25/15 163 lb (73.936 kg)  12/27/14 172 lb (78.019 kg)     Other studies Reviewed: Additional studies/ records that were reviewed today include: . Review of the above records demonstrates:     Assessment and Plan:   1. Saddle embolus of pulmonary artery/CVT: She is on coumadin therapy and will need this for lifetime.   2. Carotid stenosis: Stable 06/03/14. Known 60-79% LICA stenosis. Repeat now.   3. CAD: She is s/p bypass. Stress myoview June 2016 with no ischemia. Continue ASA, statin and beta blocker.    4. 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 Continue coumadin.   5.  Ischemic cardiomyopathy/Chronic systolic CHF: Weight stable. LE edema stable. Continue current meds. She is on daily lasix. Continue beta blocker. No Ace-inh with hypotension.   Current medicines are reviewed at length with the patient today.  The patient  does not have concerns regarding medicines.  The following changes have been made:  Changed metoprolol  Labs/ tests ordered today include:   No orders of the defined types were placed in this encounter.    Disposition:   FU with me  in 6 months   Signed, Verne Carrow, MD 08/08/2015 7:45 PM    [facility] [address] [place], [place], [place]  [address] Phone: [phone]; Fax: [phone]

## 2015-08-08 NOTE — Patient Instructions (Addendum)
Medication Instructions:  Your physician recommends that you continue on your current medications as directed. Please refer to the Current Medication list given to you today.   Labwork: none  Testing/Procedures: Your physician has requested that you have a carotid duplex. This test is an ultrasound of the carotid arteries in your neck. It looks at blood flow through these arteries that supply the brain with blood. Allow one hour for this exam. There are no restrictions or special instructions.  To be done in [place] office.     Follow-Up: Your physician wants you to follow-up in: 6 months.  You will receive a reminder letter in the mail two months in advance. If you don't receive a letter, please call our office to schedule the follow-up appointment.   Any Other Special Instructions Will Be Listed Below (If Applicable).     If you need a refill on your cardiac medications before your next appointment, please call your pharmacy.

## 2015-08-18 DIAGNOSIS — H5210 Myopia, unspecified eye: Secondary | ICD-10-CM | POA: Diagnosis not present

## 2015-08-18 DIAGNOSIS — H524 Presbyopia: Secondary | ICD-10-CM | POA: Diagnosis not present

## 2015-08-23 DIAGNOSIS — H35372 Puckering of macula, left eye: Secondary | ICD-10-CM | POA: Diagnosis not present

## 2015-08-23 DIAGNOSIS — H25811 Combined forms of age-related cataract, right eye: Secondary | ICD-10-CM | POA: Diagnosis not present

## 2015-08-25 DIAGNOSIS — J410 Simple chronic bronchitis: Secondary | ICD-10-CM | POA: Diagnosis not present

## 2015-08-25 DIAGNOSIS — I5020 Unspecified systolic (congestive) heart failure: Secondary | ICD-10-CM | POA: Diagnosis not present

## 2015-08-25 DIAGNOSIS — R0602 Shortness of breath: Secondary | ICD-10-CM | POA: Diagnosis not present

## 2015-08-29 DIAGNOSIS — H25811 Combined forms of age-related cataract, right eye: Secondary | ICD-10-CM | POA: Diagnosis not present

## 2015-08-29 DIAGNOSIS — H2511 Age-related nuclear cataract, right eye: Secondary | ICD-10-CM | POA: Diagnosis not present

## 2015-09-06 DIAGNOSIS — H2513 Age-related nuclear cataract, bilateral: Secondary | ICD-10-CM | POA: Diagnosis not present

## 2015-09-07 ENCOUNTER — Ambulatory Visit (INDEPENDENT_AMBULATORY_CARE_PROVIDER_SITE_OTHER): Payer: Commercial Managed Care - HMO

## 2015-09-07 ENCOUNTER — Ambulatory Visit: Payer: Commercial Managed Care - HMO

## 2015-09-07 DIAGNOSIS — Z5181 Encounter for therapeutic drug level monitoring: Secondary | ICD-10-CM

## 2015-09-07 DIAGNOSIS — I6523 Occlusion and stenosis of bilateral carotid arteries: Secondary | ICD-10-CM

## 2015-09-07 DIAGNOSIS — I2692 Saddle embolus of pulmonary artery without acute cor pulmonale: Secondary | ICD-10-CM | POA: Diagnosis not present

## 2015-09-07 DIAGNOSIS — I2510 Atherosclerotic heart disease of native coronary artery without angina pectoris: Secondary | ICD-10-CM | POA: Diagnosis not present

## 2015-09-07 LAB — POCT INR: INR: 2

## 2015-09-25 DIAGNOSIS — R0602 Shortness of breath: Secondary | ICD-10-CM | POA: Diagnosis not present

## 2015-09-25 DIAGNOSIS — I5020 Unspecified systolic (congestive) heart failure: Secondary | ICD-10-CM | POA: Diagnosis not present

## 2015-09-25 DIAGNOSIS — J410 Simple chronic bronchitis: Secondary | ICD-10-CM | POA: Diagnosis not present

## 2015-10-19 ENCOUNTER — Ambulatory Visit (INDEPENDENT_AMBULATORY_CARE_PROVIDER_SITE_OTHER): Payer: Commercial Managed Care - HMO

## 2015-10-19 DIAGNOSIS — I2510 Atherosclerotic heart disease of native coronary artery without angina pectoris: Secondary | ICD-10-CM

## 2015-10-19 DIAGNOSIS — I2692 Saddle embolus of pulmonary artery without acute cor pulmonale: Secondary | ICD-10-CM

## 2015-10-19 DIAGNOSIS — Z5181 Encounter for therapeutic drug level monitoring: Secondary | ICD-10-CM | POA: Diagnosis not present

## 2015-10-19 LAB — POCT INR: INR: 2.5

## 2015-10-25 DIAGNOSIS — J410 Simple chronic bronchitis: Secondary | ICD-10-CM | POA: Diagnosis not present

## 2015-10-25 DIAGNOSIS — I5020 Unspecified systolic (congestive) heart failure: Secondary | ICD-10-CM | POA: Diagnosis not present

## 2015-10-25 DIAGNOSIS — R0602 Shortness of breath: Secondary | ICD-10-CM | POA: Diagnosis not present

## 2015-11-07 DIAGNOSIS — E0842 Diabetes mellitus due to underlying condition with diabetic polyneuropathy: Secondary | ICD-10-CM | POA: Diagnosis not present

## 2015-11-07 DIAGNOSIS — I10 Essential (primary) hypertension: Secondary | ICD-10-CM | POA: Diagnosis not present

## 2015-11-07 DIAGNOSIS — G629 Polyneuropathy, unspecified: Secondary | ICD-10-CM | POA: Diagnosis not present

## 2015-11-07 DIAGNOSIS — R600 Localized edema: Secondary | ICD-10-CM | POA: Diagnosis not present

## 2015-11-07 DIAGNOSIS — M109 Gout, unspecified: Secondary | ICD-10-CM | POA: Diagnosis not present

## 2015-11-07 DIAGNOSIS — K219 Gastro-esophageal reflux disease without esophagitis: Secondary | ICD-10-CM | POA: Diagnosis not present

## 2015-11-07 DIAGNOSIS — E1142 Type 2 diabetes mellitus with diabetic polyneuropathy: Secondary | ICD-10-CM | POA: Diagnosis not present

## 2015-11-07 DIAGNOSIS — E782 Mixed hyperlipidemia: Secondary | ICD-10-CM | POA: Diagnosis not present

## 2015-11-07 DIAGNOSIS — M810 Age-related osteoporosis without current pathological fracture: Secondary | ICD-10-CM | POA: Diagnosis not present

## 2015-11-18 DIAGNOSIS — Z1211 Encounter for screening for malignant neoplasm of colon: Secondary | ICD-10-CM | POA: Diagnosis not present

## 2015-11-25 DIAGNOSIS — R0602 Shortness of breath: Secondary | ICD-10-CM | POA: Diagnosis not present

## 2015-11-25 DIAGNOSIS — I5020 Unspecified systolic (congestive) heart failure: Secondary | ICD-10-CM | POA: Diagnosis not present

## 2015-11-25 DIAGNOSIS — J410 Simple chronic bronchitis: Secondary | ICD-10-CM | POA: Diagnosis not present

## 2015-11-30 ENCOUNTER — Encounter (INDEPENDENT_AMBULATORY_CARE_PROVIDER_SITE_OTHER): Payer: Self-pay

## 2015-11-30 ENCOUNTER — Ambulatory Visit (INDEPENDENT_AMBULATORY_CARE_PROVIDER_SITE_OTHER): Payer: Commercial Managed Care - HMO | Admitting: *Deleted

## 2015-11-30 DIAGNOSIS — I2510 Atherosclerotic heart disease of native coronary artery without angina pectoris: Secondary | ICD-10-CM

## 2015-11-30 DIAGNOSIS — I2692 Saddle embolus of pulmonary artery without acute cor pulmonale: Secondary | ICD-10-CM

## 2015-11-30 DIAGNOSIS — Z5181 Encounter for therapeutic drug level monitoring: Secondary | ICD-10-CM | POA: Diagnosis not present

## 2015-11-30 LAB — POCT INR: INR: 3.1

## 2015-12-25 DIAGNOSIS — I5020 Unspecified systolic (congestive) heart failure: Secondary | ICD-10-CM | POA: Diagnosis not present

## 2015-12-25 DIAGNOSIS — R0602 Shortness of breath: Secondary | ICD-10-CM | POA: Diagnosis not present

## 2015-12-25 DIAGNOSIS — J410 Simple chronic bronchitis: Secondary | ICD-10-CM | POA: Diagnosis not present

## 2016-01-04 ENCOUNTER — Telehealth: Payer: Self-pay

## 2016-01-04 MED ORDER — WARFARIN SODIUM 2 MG PO TABS: ORAL_TABLET | ORAL | 3 refills | Status: DC

## 2016-01-04 NOTE — Addendum Note (Signed)
Addended by: Satira Sark on: 01/04/2016 01:31 PM   Modules accepted: Orders

## 2016-01-05 NOTE — Telephone Encounter (Signed)
Warfarin Rx was called into [facility] 01/04/16, there escribe has been down all day.  Rx phoned into pharmacy.

## 2016-01-05 NOTE — Telephone Encounter (Signed)
This medication was not e-scribed. Please resend.

## 2016-01-11 ENCOUNTER — Ambulatory Visit (INDEPENDENT_AMBULATORY_CARE_PROVIDER_SITE_OTHER): Payer: Commercial Managed Care - HMO

## 2016-01-11 DIAGNOSIS — Z5181 Encounter for therapeutic drug level monitoring: Secondary | ICD-10-CM | POA: Diagnosis not present

## 2016-01-11 DIAGNOSIS — I2510 Atherosclerotic heart disease of native coronary artery without angina pectoris: Secondary | ICD-10-CM | POA: Diagnosis not present

## 2016-01-11 DIAGNOSIS — I2692 Saddle embolus of pulmonary artery without acute cor pulmonale: Secondary | ICD-10-CM | POA: Diagnosis not present

## 2016-01-11 LAB — POCT INR: INR: 2.7

## 2016-01-25 DIAGNOSIS — R0602 Shortness of breath: Secondary | ICD-10-CM | POA: Diagnosis not present

## 2016-01-25 DIAGNOSIS — I5020 Unspecified systolic (congestive) heart failure: Secondary | ICD-10-CM | POA: Diagnosis not present

## 2016-01-25 DIAGNOSIS — J410 Simple chronic bronchitis: Secondary | ICD-10-CM | POA: Diagnosis not present

## 2016-02-22 ENCOUNTER — Ambulatory Visit (INDEPENDENT_AMBULATORY_CARE_PROVIDER_SITE_OTHER): Payer: Commercial Managed Care - HMO | Admitting: *Deleted

## 2016-02-22 DIAGNOSIS — Z5181 Encounter for therapeutic drug level monitoring: Secondary | ICD-10-CM

## 2016-02-22 DIAGNOSIS — I2510 Atherosclerotic heart disease of native coronary artery without angina pectoris: Secondary | ICD-10-CM | POA: Diagnosis not present

## 2016-02-22 DIAGNOSIS — I2692 Saddle embolus of pulmonary artery without acute cor pulmonale: Secondary | ICD-10-CM | POA: Diagnosis not present

## 2016-02-22 LAB — POCT INR: INR: 2.6

## 2016-02-22 MED ORDER — WARFARIN SODIUM 2 MG PO TABS: ORAL_TABLET | ORAL | 3 refills | Status: DC

## 2016-02-25 DIAGNOSIS — I5020 Unspecified systolic (congestive) heart failure: Secondary | ICD-10-CM | POA: Diagnosis not present

## 2016-02-25 DIAGNOSIS — J410 Simple chronic bronchitis: Secondary | ICD-10-CM | POA: Diagnosis not present

## 2016-02-25 DIAGNOSIS — R0602 Shortness of breath: Secondary | ICD-10-CM | POA: Diagnosis not present

## 2016-03-20 DIAGNOSIS — M059 Rheumatoid arthritis with rheumatoid factor, unspecified: Secondary | ICD-10-CM | POA: Diagnosis not present

## 2016-03-20 DIAGNOSIS — M1A09X0 Idiopathic chronic gout, multiple sites, without tophus (tophi): Secondary | ICD-10-CM | POA: Diagnosis not present

## 2016-03-20 DIAGNOSIS — Z79899 Other long term (current) drug therapy: Secondary | ICD-10-CM | POA: Diagnosis not present

## 2016-03-20 DIAGNOSIS — M2550 Pain in unspecified joint: Secondary | ICD-10-CM | POA: Diagnosis not present

## 2016-03-26 DIAGNOSIS — I5020 Unspecified systolic (congestive) heart failure: Secondary | ICD-10-CM | POA: Diagnosis not present

## 2016-03-26 DIAGNOSIS — R0602 Shortness of breath: Secondary | ICD-10-CM | POA: Diagnosis not present

## 2016-03-26 DIAGNOSIS — J410 Simple chronic bronchitis: Secondary | ICD-10-CM | POA: Diagnosis not present

## 2016-04-04 ENCOUNTER — Ambulatory Visit (INDEPENDENT_AMBULATORY_CARE_PROVIDER_SITE_OTHER): Payer: Commercial Managed Care - HMO

## 2016-04-04 DIAGNOSIS — I2692 Saddle embolus of pulmonary artery without acute cor pulmonale: Secondary | ICD-10-CM | POA: Diagnosis not present

## 2016-04-04 DIAGNOSIS — I2510 Atherosclerotic heart disease of native coronary artery without angina pectoris: Secondary | ICD-10-CM | POA: Diagnosis not present

## 2016-04-04 DIAGNOSIS — Z5181 Encounter for therapeutic drug level monitoring: Secondary | ICD-10-CM

## 2016-04-04 LAB — POCT INR: INR: 2.1

## 2016-04-26 DIAGNOSIS — J410 Simple chronic bronchitis: Secondary | ICD-10-CM | POA: Diagnosis not present

## 2016-04-26 DIAGNOSIS — R0602 Shortness of breath: Secondary | ICD-10-CM | POA: Diagnosis not present

## 2016-04-26 DIAGNOSIS — I5020 Unspecified systolic (congestive) heart failure: Secondary | ICD-10-CM | POA: Diagnosis not present

## 2016-05-15 IMAGING — CR DG CHEST 2V
2 series · 2 of 2 positions shown · non-contrast
Comparison: 05/25/2015

CLINICAL DATA: Abnormal physical exam on left follow-up previous
abnormal chest x-ray

EXAM:
CHEST  2 VIEW

[w chest pa]
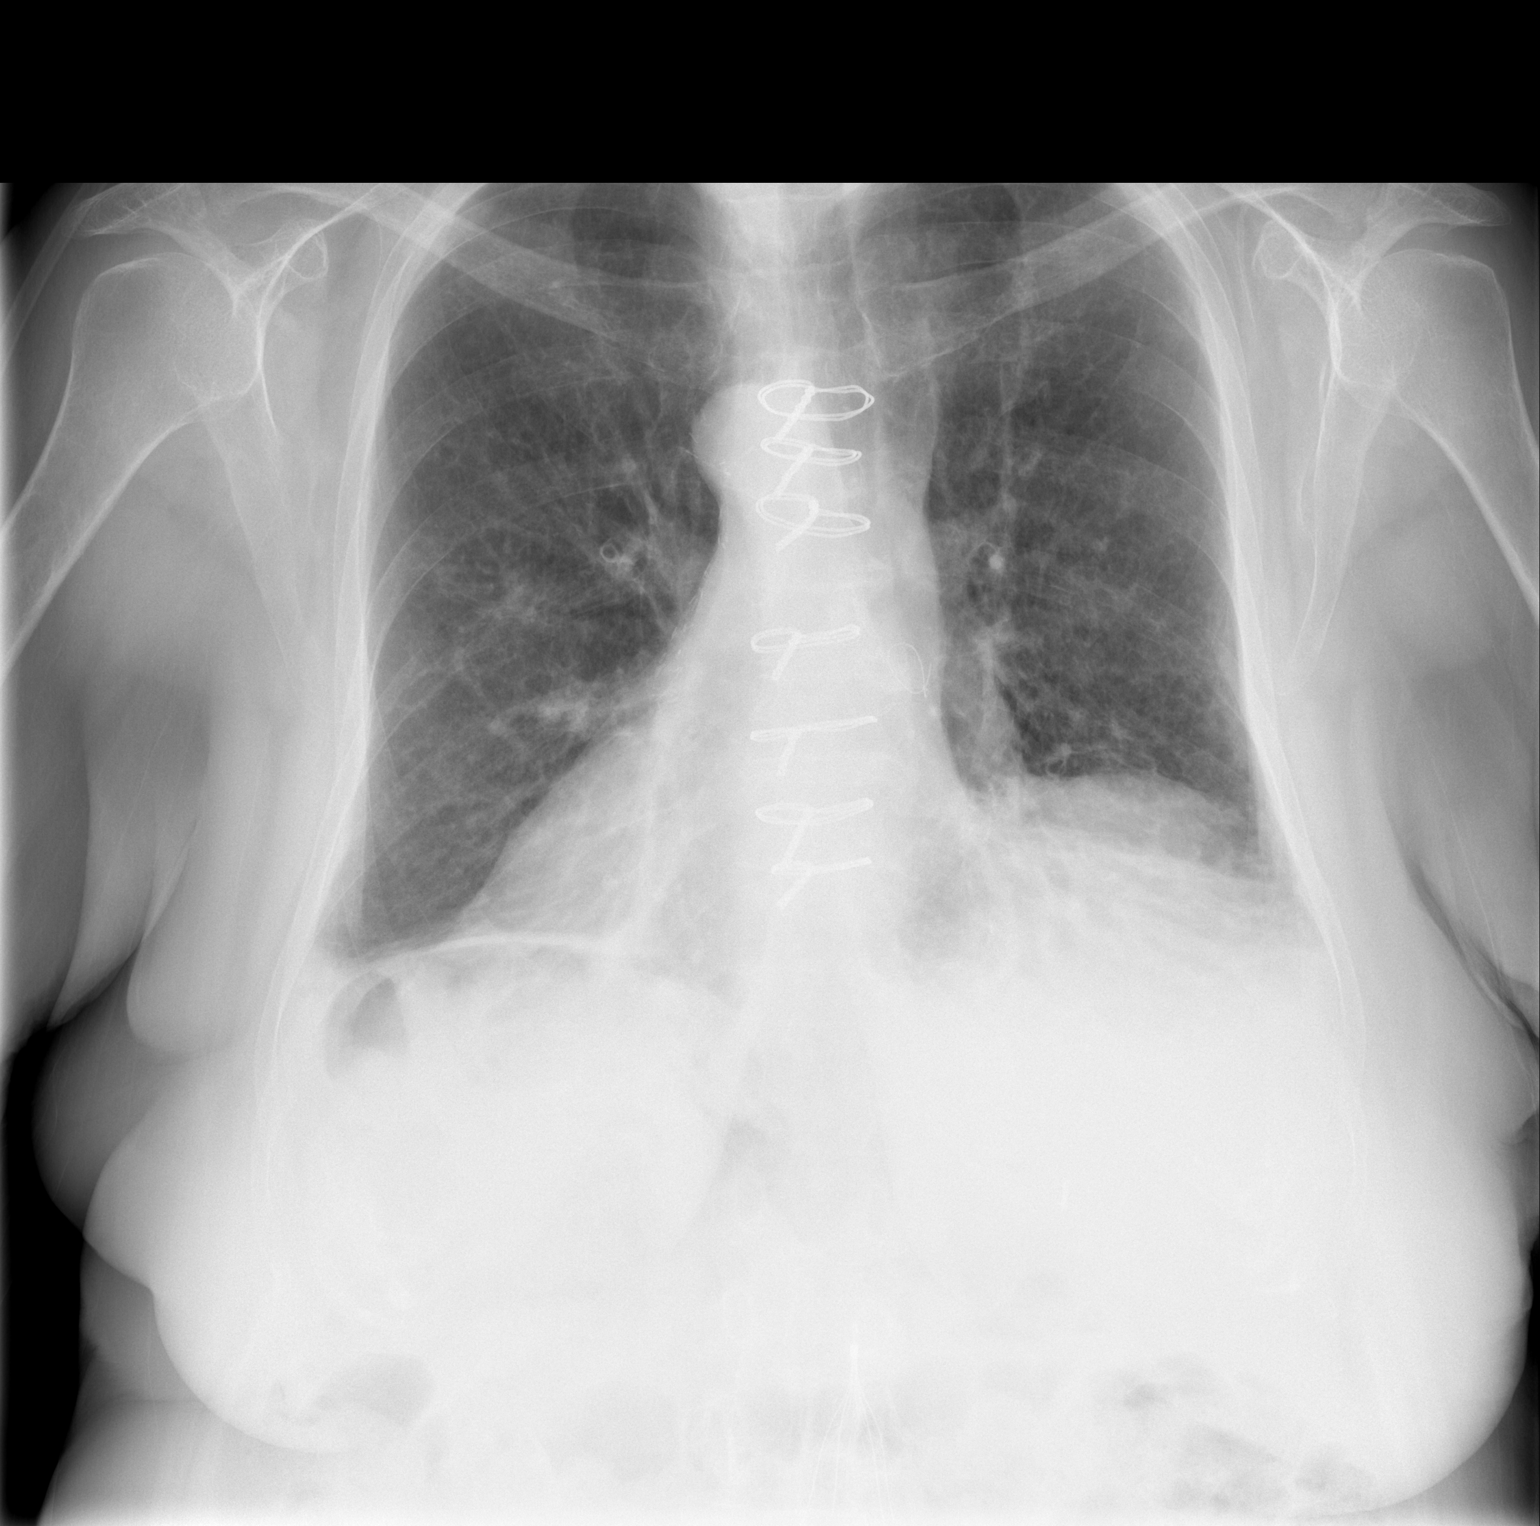

[w chest lat]
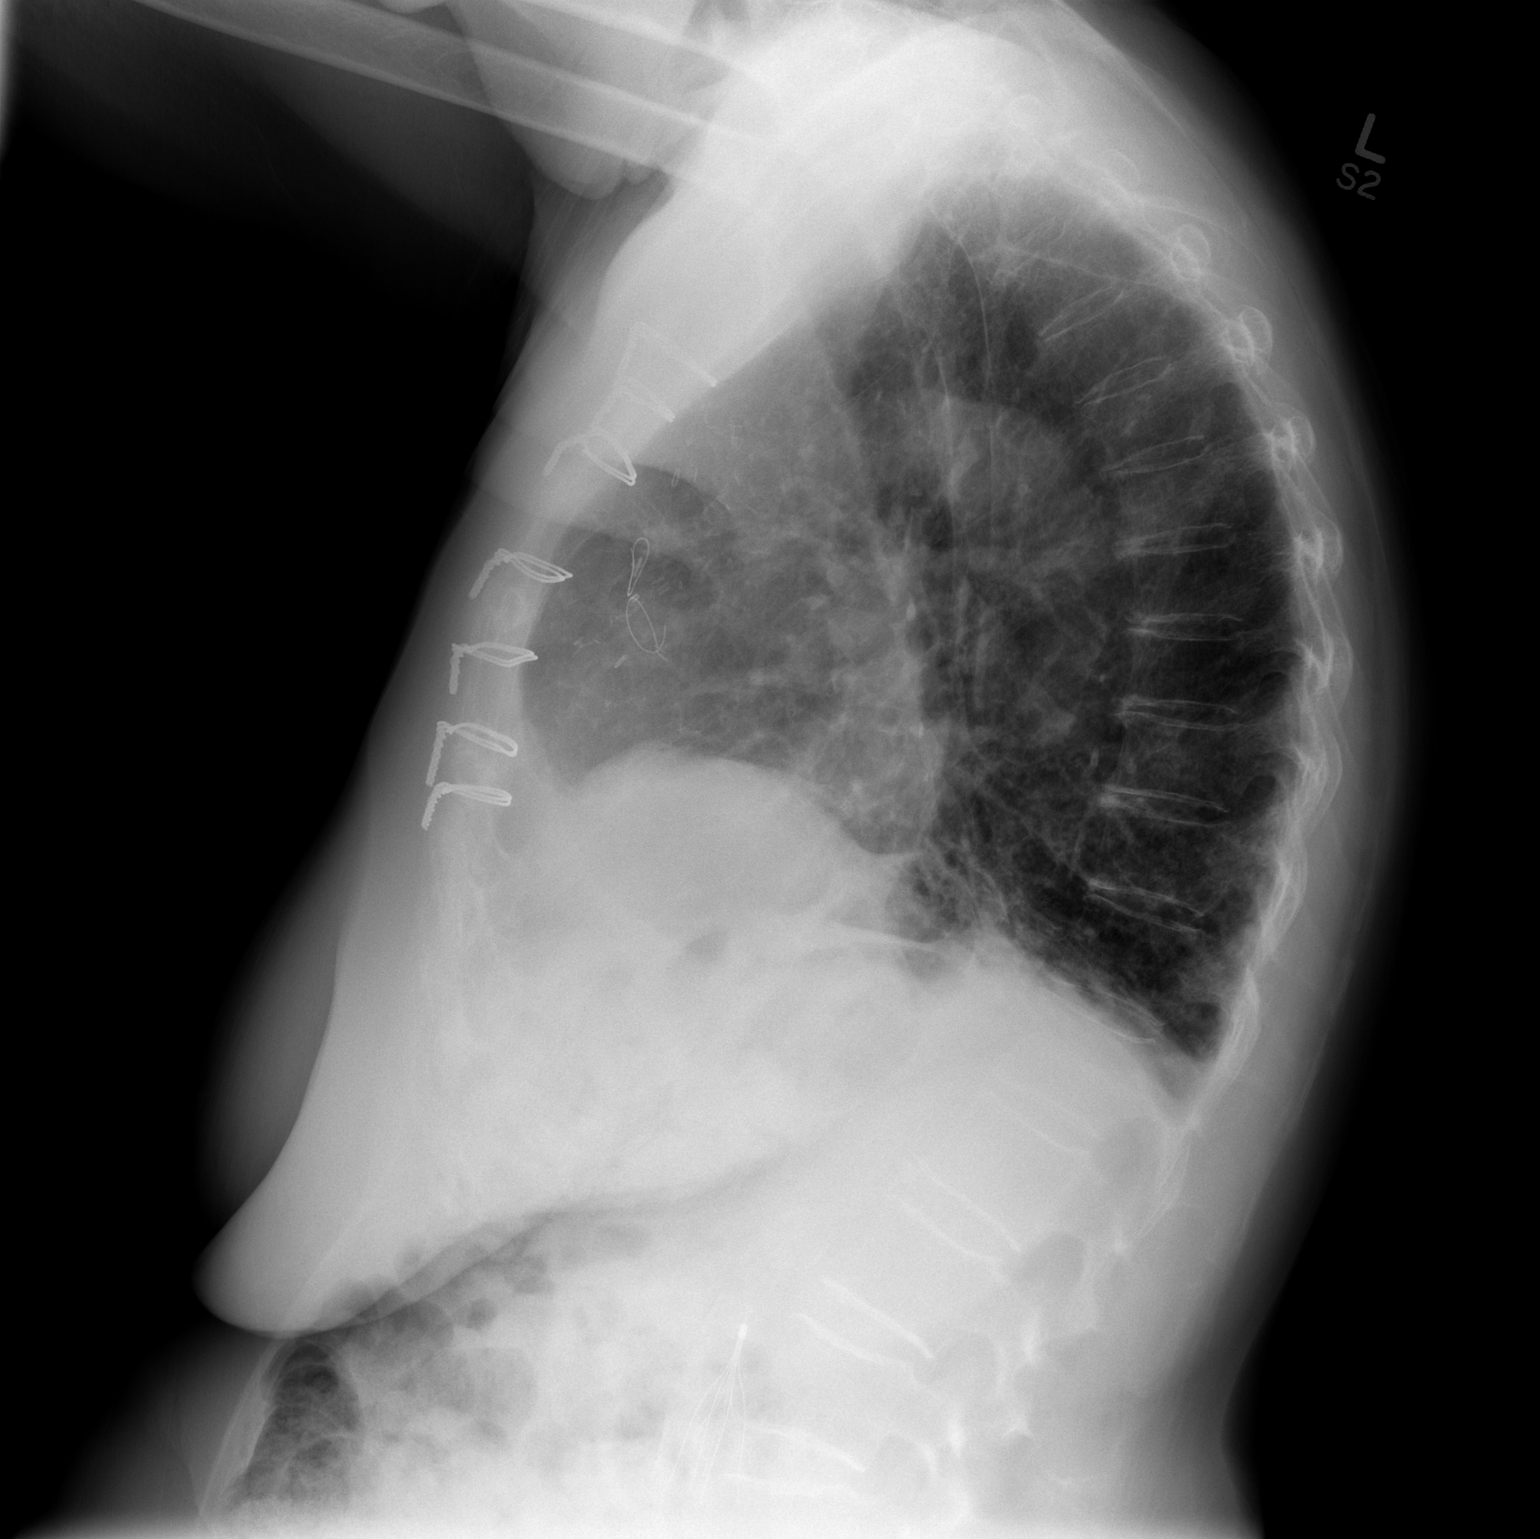

[2 of 2 positions shown; findings below may reference images not displayed]

FINDINGS: The cardiac shadow is within normal limits. Changes consistent with
sites in versus are noted. Bibasilar changes of bronchiectasis worse
on the left than the right are noted similar to that seen on the
prior CT examination. No new focal abnormality is seen.
IMPRESSION: Chronic changes of bronchiectasis bilaterally the worse on the left
than the right.

Changes of sinus inversus.

## 2016-05-16 ENCOUNTER — Ambulatory Visit (INDEPENDENT_AMBULATORY_CARE_PROVIDER_SITE_OTHER): Payer: Commercial Managed Care - HMO

## 2016-05-16 DIAGNOSIS — Z5181 Encounter for therapeutic drug level monitoring: Secondary | ICD-10-CM | POA: Diagnosis not present

## 2016-05-16 DIAGNOSIS — I2510 Atherosclerotic heart disease of native coronary artery without angina pectoris: Secondary | ICD-10-CM

## 2016-05-16 DIAGNOSIS — I2692 Saddle embolus of pulmonary artery without acute cor pulmonale: Secondary | ICD-10-CM

## 2016-05-16 LAB — POCT INR: INR: 3.1

## 2016-05-26 DIAGNOSIS — J410 Simple chronic bronchitis: Secondary | ICD-10-CM | POA: Diagnosis not present

## 2016-05-26 DIAGNOSIS — R0602 Shortness of breath: Secondary | ICD-10-CM | POA: Diagnosis not present

## 2016-05-26 DIAGNOSIS — I5020 Unspecified systolic (congestive) heart failure: Secondary | ICD-10-CM | POA: Diagnosis not present

## 2016-06-12 DIAGNOSIS — M109 Gout, unspecified: Secondary | ICD-10-CM | POA: Diagnosis not present

## 2016-06-12 DIAGNOSIS — K219 Gastro-esophageal reflux disease without esophagitis: Secondary | ICD-10-CM | POA: Diagnosis not present

## 2016-06-12 DIAGNOSIS — E0842 Diabetes mellitus due to underlying condition with diabetic polyneuropathy: Secondary | ICD-10-CM | POA: Diagnosis not present

## 2016-06-12 DIAGNOSIS — Z0000 Encounter for general adult medical examination without abnormal findings: Secondary | ICD-10-CM | POA: Diagnosis not present

## 2016-06-12 DIAGNOSIS — E1142 Type 2 diabetes mellitus with diabetic polyneuropathy: Secondary | ICD-10-CM | POA: Diagnosis not present

## 2016-06-12 DIAGNOSIS — R600 Localized edema: Secondary | ICD-10-CM | POA: Diagnosis not present

## 2016-06-12 DIAGNOSIS — G629 Polyneuropathy, unspecified: Secondary | ICD-10-CM | POA: Diagnosis not present

## 2016-06-12 DIAGNOSIS — I129 Hypertensive chronic kidney disease with stage 1 through stage 4 chronic kidney disease, or unspecified chronic kidney disease: Secondary | ICD-10-CM | POA: Diagnosis not present

## 2016-06-12 DIAGNOSIS — E782 Mixed hyperlipidemia: Secondary | ICD-10-CM | POA: Diagnosis not present

## 2016-06-26 DIAGNOSIS — I5020 Unspecified systolic (congestive) heart failure: Secondary | ICD-10-CM | POA: Diagnosis not present

## 2016-06-26 DIAGNOSIS — R0602 Shortness of breath: Secondary | ICD-10-CM | POA: Diagnosis not present

## 2016-06-26 DIAGNOSIS — J410 Simple chronic bronchitis: Secondary | ICD-10-CM | POA: Diagnosis not present

## 2016-06-27 ENCOUNTER — Ambulatory Visit (INDEPENDENT_AMBULATORY_CARE_PROVIDER_SITE_OTHER): Payer: Medicare HMO | Admitting: *Deleted

## 2016-06-27 DIAGNOSIS — I2692 Saddle embolus of pulmonary artery without acute cor pulmonale: Secondary | ICD-10-CM

## 2016-06-27 DIAGNOSIS — I2510 Atherosclerotic heart disease of native coronary artery without angina pectoris: Secondary | ICD-10-CM | POA: Diagnosis not present

## 2016-06-27 DIAGNOSIS — Z5181 Encounter for therapeutic drug level monitoring: Secondary | ICD-10-CM

## 2016-06-27 LAB — POCT INR: INR: 3

## 2016-06-27 MED ORDER — WARFARIN SODIUM 2 MG PO TABS: ORAL_TABLET | ORAL | 3 refills | Status: DC

## 2016-07-03 DIAGNOSIS — R0902 Hypoxemia: Secondary | ICD-10-CM | POA: Diagnosis not present

## 2016-07-24 ENCOUNTER — Ambulatory Visit
Admission: RE | Admit: 2016-07-24 | Discharge: 2016-07-24 | Disposition: A | Discharge status: Home / Self Care | Payer: Medicare HMO | Source: Ambulatory Visit | Attending: Family Medicine | Admitting: Family Medicine

## 2016-07-24 ENCOUNTER — Other Ambulatory Visit: Payer: Self-pay | Admitting: Family Medicine

## 2016-07-24 DIAGNOSIS — R0602 Shortness of breath: Secondary | ICD-10-CM

## 2016-07-27 DIAGNOSIS — I5020 Unspecified systolic (congestive) heart failure: Secondary | ICD-10-CM | POA: Diagnosis not present

## 2016-07-27 DIAGNOSIS — J410 Simple chronic bronchitis: Secondary | ICD-10-CM | POA: Diagnosis not present

## 2016-07-27 DIAGNOSIS — R0602 Shortness of breath: Secondary | ICD-10-CM | POA: Diagnosis not present

## 2016-08-08 ENCOUNTER — Ambulatory Visit (INDEPENDENT_AMBULATORY_CARE_PROVIDER_SITE_OTHER): Payer: Medicare HMO

## 2016-08-08 DIAGNOSIS — I2692 Saddle embolus of pulmonary artery without acute cor pulmonale: Secondary | ICD-10-CM

## 2016-08-08 DIAGNOSIS — Z5181 Encounter for therapeutic drug level monitoring: Secondary | ICD-10-CM | POA: Diagnosis not present

## 2016-08-08 LAB — POCT INR: INR: 2.4

## 2016-08-08 NOTE — Progress Notes (Signed)
Chief Complaint  Patient presents with  . Coronary Artery Disease    History of Present Illness: 76 yo female with history of dextrocardia, CAD s/p CABG, PE, carotid artery disease who is here today for cardiac follow up. She was admitted 12/28-1/22/13 with symptoms of pancreatitis. Diagnosed with cholelithiasis and underwent ERCP with sphincterotomy and stone extraction. Following this, she developed pulmonary edema and ruled in for an NSTEMI. Stress testing was undertaken and demonstrated an EF 20% and situs inversus. 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 LVEF was 40-45% by echo and LV gram. 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 What would typically be called RCA arose from anterior sinus of Valsalva. What would typically be called the left main arises from the posterior sinus of Valsalva and gives rise to a large first diagonal branch and diminutive circumflex. She was then seen by Dr. Laneta Simmers of [facility]. CABG was recommended. Gen. Surgery felt that cholecystectomy could be done in the future as cardiac risk outweighed further intervention for her gallbladder. CABG was performed 06/14/11: RIMA-LAD, SVG-ramus, SVG-RCA. Pre-CABG Dopplers 06/12/11: LICA 60-79%. I saw her in March 2013 and she was c/o dyspnea. I discussed a CTA chest to exclude PE and a CXR but she refused. She was admitted March 10,2013 to [facility] with right-sided pleuritic chest pain and dyspnea. She underwent CT scanning which showed a large saddle embolus. Echocardiogram showed right heart strain and a possible mass in the left atrium as well as moderate sized pericardial effusion, LVEF 50-55%. She did not meet criteria for thrombolysis. Bilateral lower extremity dopplers were positive for subacute DVT noted throughout the right lower extremity. There was acute, occlusive DVT noted in the posterior tibial vein of  the left lower extremity. She was started on heparin and coumadin. She continued to have high O2 requirements and decision was made to place IVC filter. Hospital course was complicated by volume overload in setting of RV strain secondary to PE / diastolic failure. She was aggressively diuresed to achieve negative balance. 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 Repeat carotid dopplers 06/03/14 40-59% RICA stenosis, stable 60-79% LICA stenosis. Repeat echo 04/22/12 with normal LV size and function with mild valve disease and no pericardial effusion. She had her cholecystectomy in December 2013. Stress myoview 12/13/14 with no ischemia, small apical fixed defect.   She is here today for cardiac follow up.  She is feeling well overall. No chest pain but she is having more dyspnea. Recent PFTs in primary care with severe COPD. She has ongoing dyspnea and cough. Her cough is non-productive. Chest xray in primary care with chronic bibasilar bronchiectasis and peribronchial airspace consolidation. She has been referred to see Pulmonary but appt not until September 08, 2016. No LE edema.   Primary Care Physician: Lolita Patella, MD   Past Medical History:  Diagnosis Date  . Anemia   . Arthritis   . CAD (coronary artery disease)    NSTEMI in setting of gallstone pancreatitis 05/2011:  LHC with 3v CAD; CABG was performed 06/14/11: RIMA-LAD, SVG-ramus, SVG-RCA  . Carotid stenosis    Dopplers 06/12/11: LICA 60-79%.  . CHF (congestive heart failure) (HCC)   . Chronic bronchitis   . Chronic systolic heart failure (HCC)   . Collagen vascular disease (HCC)   . Dextrocardia   .  DVT of leg (deep venous thrombosis) (HCC) ~2006   left  . Fracture 05/24/2011   right; "did not have surgery"  . GERD (gastroesophageal reflux disease)   . H/O hiatal hernia   . HLD (hyperlipidemia)   . Hypertension   . Ischemic cardiomyopathy     Echocardiogram 06/05/11: EF 40-45%, anteroseptal hypokinesis, apical hypokinesis, mild LAE  . Neuropathy (HCC)   . Pneumonia 06/13/11   "couple times; long time ago"  . Pulmonary embolus [facility]) March 2013  . Situs inversus with dextrocardia    CT 06/2011: situs inversus totalis.  What would typically be called RCA arose from anterior sinus of Valsalva.  What would typically be called the left main arises from the posterior sinus of Valsalva and gives rise to a large first diagonal branch and diminutive circumflex    Past Surgical History:  Procedure Laterality Date  . ARCH AORTOGRAM Right 06/11/2011   Procedure: ARCH AORTOGRAM;  Surgeon: Kathleene Hazel, MD;  Location: [facility] CATH LAB;  Service: Cardiovascular;  Laterality: Right;  . CARDIAC CATHETERIZATION  06/11/11  . CHOLECYSTECTOMY    . CORONARY ARTERY BYPASS GRAFT  06/14/2011   Procedure: CORONARY ARTERY BYPASS GRAFTING (CABG);  Surgeon: Alleen Borne, MD;  Location: [facility] OR;  Service: Open Heart Surgery;  Laterality: N/A;  . ERCP  06/03/2011   Procedure: ENDOSCOPIC RETROGRADE CHOLANGIOPANCREATOGRAPHY (ERCP);  Surgeon: Petra Kuba, MD;  Location: [facility] OR;  Service: Endoscopy;  Laterality: N/A;  . LEFT HEART CATHETERIZATION WITH CORONARY ANGIOGRAM N/A 06/11/2011   Procedure: LEFT HEART CATHETERIZATION WITH CORONARY ANGIOGRAM;  Surgeon: Kathleene Hazel, MD;  Location: [facility] CATH LAB;  Service: Cardiovascular;  Laterality: N/A;  . VAGINAL HYSTERECTOMY  1970's    Current Outpatient Prescriptions  Medication Sig Dispense Refill  . alendronate (FOSAMAX) 70 MG tablet Take 70 mg by mouth every Sunday.     Marland Kitchen allopurinol (ZYLOPRIM) 100 MG tablet Take 100 mg by mouth 2 (two) times daily.     Marland Kitchen aspirin EC 81 MG tablet Take 81 mg by mouth daily.    . cholecalciferol (VITAMIN D) 1000 UNITS tablet Take 1,000 Units by mouth daily.    . folic acid (FOLVITE) 800 MCG tablet Take 400 mcg by mouth daily.    Marland Kitchen gabapentin (NEURONTIN) 300 MG capsule Take 300  mg by mouth 2 (two) times daily.      . methotrexate (RHEUMATREX) 2.5 MG tablet Take 22.5 mg by mouth every Monday.     Marland Kitchen omeprazole (PRILOSEC) 20 MG capsule Take 20 mg by mouth every morning.      . simvastatin (ZOCOR) 20 MG tablet Take 20 mg by mouth every evening.     . warfarin (COUMADIN) 2 MG tablet Take as directed by [facility] 65 tablet 3   No current facility-administered medications for this visit.     Allergies  Allergen Reactions  . Penicillins Anaphylaxis  . Shellfish Allergy Anaphylaxis  . Levaquin [Levofloxacin] Hives    Social History   Social History  . Marital status: Widowed    Spouse name: N/A  . Number of children: N/A  . Years of education: N/A   Occupational History  . Not on file.   Social History Main Topics  . Smoking status: Never Smoker  . Smokeless tobacco: Never Used  . Alcohol use No  . Drug use: No  . Sexual activity: No   Other Topics Concern  . Not on file   Social History Narrative  . No narrative  on file    No family history on file.  Review of Systems:  As stated in the HPI and otherwise negative.   BP 108/70   Pulse 100   Ht 5\' 6"  (1.676 m)   Wt 165 lb 6.4 oz (75 kg)   BMI 26.70 kg/m   Physical Examination: General: Well developed, well nourished, NAD  HEENT: OP clear, mucus membranes moist  SKIN: warm, dry. No rashes. Neuro: No focal deficits  Musculoskeletal: Muscle strength 5/5 all ext  Psychiatric: Mood and affect normal  Neck: No JVD, no carotid bruits, no thyromegaly, no lymphadenopathy.  Lungs:Clear bilaterally, no wheezes, rhonci, crackles Cardiovascular: Regular rate and rhythm. No murmurs, gallops or rubs. Abdomen:Soft. Bowel sounds present. Non-tender.  Extremities: No lower extremity edema. Pulses are 2 + in the bilateral DP/PT.  Carotid artery dopplers: 06/03/14 40-59% RICA stenosis, 60-79% LICA stenosis.   Echo 04/22/12: Left ventricle: The cavity size was normal. Wall thickness was normal.  Systolic function was normal. The estimated ejection fraction was in the range of 55% to 60%. Wall motion was normal; there were no regional wall motion abnormalities. Doppler parameters are consistent with abnormal left ventricular relaxation (grade 1 diastolic dysfunction). - Aortic valve: There was no stenosis. - Mitral valve: Mild regurgitation. - Right ventricle: The cavity size was normal. Systolic function was normal. - Tricuspid valve: Peak RV-RA gradient: 45mm Hg (S). - Pulmonary arteries: PA peak pressure: 30mm Hg (S). - Inferior vena cava: The vessel was normal in size; the respirophasic diameter changes were in the normal range (= 50%); findings are consistent with normal central venous pressure. - Pericardium, extracardiac: There was no pericardial effusion. Impressions:  - Normal LV size and systolic function, EF 55-60%. Normal RV size and systolic function. Mild mitral regurgitation.  EKG:  EKG is ordered today. The ekg ordered today demonstrates Sinus tach, rate 103 bpm. Old inferior Q waves. Poor R wave progression precordial leads.   Recent Labs: No results found for requested labs within last 8760 hours.   Lipid Panel    Component Value Date/Time   CHOL 124 06/14/2011 0400   TRIG 91 06/14/2011 0400   HDL 36 (L) 06/14/2011 0400   CHOLHDL 3.4 06/14/2011 0400   VLDL 18 06/14/2011 0400   LDLCALC 70 06/14/2011 0400     Wt Readings from Last 3 Encounters:  08/09/16 165 lb 6.4 oz (75 kg)  08/08/15 162 lb (73.5 kg)  05/25/15 163 lb (73.9 kg)     Other studies Reviewed: Additional studies/ records that were reviewed today include: . Review of the above records demonstrates:    Assessment and Plan:   1. Saddle embolus of pulmonary artery: She is on coumadin therapy and will need this for lifetime.   2. Carotid stenosis: Stable 06/03/14. Known 60-79% LICA stenosis. Repeat in Fall of 2018.   3. CAD s/p CABG without angina: She is having no chest pain  suggestive of angina. Stress myoview June 2016 with no ischemia. Continue ASA, statin and beta blocker.    4. 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 Continue coumadin for lifetime given h/o thromboembolic disease.   5.  Ischemic cardiomyopathy/Chronic systolic CHF: Last echo in 2013 with normal LVEF. Weight stable. LE edema stable. Continue current meds. She is on daily lasix. With dyspnea, repeat echo now.    6. Dyspnea: This does not appear to be cardiac. Repeat echo to assess LV function. She has no change in weight, No LE edema. Likely  secondary to underlying lung disease which is a new diagnosis. I will see if we can help her get an earlier appt with Pulmonary.   Current medicines are reviewed at length with the patient today.  The patient does not have concerns regarding medicines.  The following changes have been made:  Changed metoprolol  Labs/ tests ordered today include:   Orders Placed This Encounter  Procedures  . EKG 12-Lead  . ECHOCARDIOGRAM COMPLETE    Disposition:   FU with me  in 6 months   Signed, Verne Carrow, MD 08/09/2016 4:03 PM    [facility] [address] [place], [place], [place]  [address] Phone: [phone]; Fax: [phone]

## 2016-08-09 ENCOUNTER — Ambulatory Visit (INDEPENDENT_AMBULATORY_CARE_PROVIDER_SITE_OTHER): Payer: Medicare HMO | Admitting: Cardiovascular Disease

## 2016-08-09 VITALS — BP 108/70 | HR 100 | Ht 66.0 in | Wt 165.4 lb

## 2016-08-09 DIAGNOSIS — I519 Heart disease, unspecified: Secondary | ICD-10-CM

## 2016-08-09 DIAGNOSIS — I255 Ischemic cardiomyopathy: Secondary | ICD-10-CM | POA: Diagnosis not present

## 2016-08-09 DIAGNOSIS — I2692 Saddle embolus of pulmonary artery without acute cor pulmonale: Secondary | ICD-10-CM | POA: Diagnosis not present

## 2016-08-09 DIAGNOSIS — I6523 Occlusion and stenosis of bilateral carotid arteries: Secondary | ICD-10-CM

## 2016-08-09 DIAGNOSIS — I2510 Atherosclerotic heart disease of native coronary artery without angina pectoris: Secondary | ICD-10-CM

## 2016-08-09 DIAGNOSIS — R0602 Shortness of breath: Secondary | ICD-10-CM | POA: Diagnosis not present

## 2016-08-09 DIAGNOSIS — I5189 Other ill-defined heart diseases: Secondary | ICD-10-CM

## 2016-08-09 NOTE — Patient Instructions (Signed)

## 2016-08-24 DIAGNOSIS — I5020 Unspecified systolic (congestive) heart failure: Secondary | ICD-10-CM | POA: Diagnosis not present

## 2016-08-24 DIAGNOSIS — R0602 Shortness of breath: Secondary | ICD-10-CM | POA: Diagnosis not present

## 2016-08-24 DIAGNOSIS — J410 Simple chronic bronchitis: Secondary | ICD-10-CM | POA: Diagnosis not present

## 2016-08-28 ENCOUNTER — Other Ambulatory Visit: Payer: Self-pay

## 2016-08-28 ENCOUNTER — Ambulatory Visit (HOSPITAL_COMMUNITY): Payer: Medicare HMO | Attending: Cardiology

## 2016-08-28 DIAGNOSIS — I2510 Atherosclerotic heart disease of native coronary artery without angina pectoris: Secondary | ICD-10-CM | POA: Insufficient documentation

## 2016-08-28 DIAGNOSIS — I255 Ischemic cardiomyopathy: Secondary | ICD-10-CM | POA: Insufficient documentation

## 2016-08-29 ENCOUNTER — Telehealth: Payer: Self-pay | Admitting: *Deleted

## 2016-08-29 MED ORDER — FUROSEMIDE 40 MG PO TABS: 40.0000 mg | ORAL_TABLET | Freq: Every day | ORAL | 3 refills | Status: DC

## 2016-08-29 NOTE — Telephone Encounter (Signed)
I spoke with [name] and reviewed echo results with her. She reports she has been taking furosemide 40 mg by mouth daily. This is not on her med list. Will add to list.  She reports she has not been taking metoprolol. She thinks this was stopped when she was in the hospital for pneumonia in 2016. Chart reviewed and metoprolol and furosemide were both placed on hold per discharge note dated 05/27/15 due to BP. [name] reports heart rate usually runs in the 90's and will sometimes go up to 120 with exertion. Will send to Dr. Clifton James to see if [name] should resume metoprolol.  Previous dose was 25 mg by mouth twice daily

## 2016-08-31 MED ORDER — METOPROLOL TARTRATE 25 MG PO TABS: 25.0000 mg | ORAL_TABLET | Freq: Two times a day (BID) | ORAL | 3 refills | Status: DC

## 2016-08-31 NOTE — Telephone Encounter (Signed)
Kathleene Hazel, MD  Dossie Arbour, RN        Ms. Berrie should resume her metoprolol.    I spoke with pt and gave her instructions to resume metoprolol tartrate 25 mg by mouth twice daily. Pt would like this sent to [facility] Mail order

## 2016-09-10 ENCOUNTER — Ambulatory Visit (INDEPENDENT_AMBULATORY_CARE_PROVIDER_SITE_OTHER): Payer: Medicare HMO | Admitting: Pulmonary Disease

## 2016-09-10 ENCOUNTER — Encounter: Payer: Self-pay | Admitting: Pulmonary Disease

## 2016-09-10 VITALS — BP 118/72 | HR 84 | Wt 166.0 lb

## 2016-09-10 DIAGNOSIS — J479 Bronchiectasis, uncomplicated: Secondary | ICD-10-CM

## 2016-09-10 DIAGNOSIS — J984 Other disorders of lung: Secondary | ICD-10-CM

## 2016-09-10 DIAGNOSIS — R0902 Hypoxemia: Secondary | ICD-10-CM

## 2016-09-10 DIAGNOSIS — R0609 Other forms of dyspnea: Secondary | ICD-10-CM

## 2016-09-10 DIAGNOSIS — Q893 Situs inversus: Secondary | ICD-10-CM

## 2016-09-10 DIAGNOSIS — G4734 Idiopathic sleep related nonobstructive alveolar hypoventilation: Secondary | ICD-10-CM | POA: Diagnosis not present

## 2016-09-10 DIAGNOSIS — Q348 Other specified congenital malformations of respiratory system: Secondary | ICD-10-CM

## 2016-09-10 MED ORDER — FLUTTER DEVI: 0 refills | Status: AC

## 2016-09-10 NOTE — Patient Instructions (Addendum)
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 You can't use this too much 4) We will arrange for a chest percussion vest. Use it 10-15 minutes twice a day  Follow up in 6-8 weeks with chest Xray and lung function tests

## 2016-09-10 NOTE — Progress Notes (Addendum)
PULMONARY CONSULT NOTE  Requesting MD/Service: Earney Hamburg Date of initial consultation: 09/10/16 Reason for consultation: dyspnea  PT PROFILE: 76 y.o. female never smoker with situs inversus referred for evaluation of DOE. Prior CT chest and CTAP reveal BLL bronchiectasis. Pt has long standing chronic sinusitis. Dx of immotile cilia syndrome made. Flutter valve and chest percussion vest initiated   DATA: 10/08/11 Spirometry: FVC:  1.68 L ( 53 %pred), FEV1:  1.14 L ( 48 %pred), FEV1/FVC: 68%   HPI:  As above. This 76 year old woman is referred by Dr. Clifton James for class III exertional dyspnea. This has been minimally progressive over the past 4-5 years. She has undergone coronary artery bypass surgery in 2013. Approximately 2 months after the surgery she did suffer a pulmonary embolism. She is on chronic anticoagulation for this. She has a history of situs inversus. He has never smoked and never been given a pulmonary diagnosis. She does have chronic cough. It is rattling but largely nonproductive. She's never had hemoptysis. She denies chest pain, orthopnea, paroxysmal nocturnal dyspnea, lower extremity edema and calf tenderness. She was prescribed nocturnal oxygen in 2016 which she wears compliantly.  Past Medical History:  Diagnosis Date  . Anemia   . Arthritis   . CAD (coronary artery disease)    NSTEMI in setting of gallstone pancreatitis 05/2011:  LHC with 3v CAD; CABG was performed 06/14/11: RIMA-LAD, SVG-ramus, SVG-RCA  . Carotid stenosis    Dopplers 06/12/11: LICA 60-79%.  . CHF (congestive heart failure) (HCC)   . Chronic bronchitis   . Chronic systolic heart failure (HCC)   . Collagen vascular disease (HCC)   . Dextrocardia   . DVT of leg (deep venous thrombosis) (HCC) ~2006   left  . Fracture 05/24/2011   right; "did not have surgery"  . GERD (gastroesophageal reflux disease)   . H/O hiatal hernia   . HLD (hyperlipidemia)   . Hypertension   . Ischemic cardiomyopathy     Echocardiogram 06/05/11: EF 40-45%, anteroseptal hypokinesis, apical hypokinesis, mild LAE  . Neuropathy (HCC)   . Pneumonia 06/13/11   "couple times; long time ago"  . Pulmonary embolus [facility]) March 2013  . Situs inversus with dextrocardia    CT 06/2011: situs inversus totalis.  What would typically be called RCA arose from anterior sinus of Valsalva.  What would typically be called the left main arises from the posterior sinus of Valsalva and gives rise to a large first diagonal branch and diminutive circumflex    Past Surgical History:  Procedure Laterality Date  . ARCH AORTOGRAM Right 06/11/2011   Procedure: ARCH AORTOGRAM;  Surgeon: Kathleene Hazel, MD;  Location: [facility] CATH LAB;  Service: Cardiovascular;  Laterality: Right;  . CARDIAC CATHETERIZATION  06/11/11  . CHOLECYSTECTOMY    . CORONARY ARTERY BYPASS GRAFT  06/14/2011   Procedure: CORONARY ARTERY BYPASS GRAFTING (CABG);  Surgeon: Alleen Borne, MD;  Location: [facility] OR;  Service: Open Heart Surgery;  Laterality: N/A;  . ERCP  06/03/2011   Procedure: ENDOSCOPIC RETROGRADE CHOLANGIOPANCREATOGRAPHY (ERCP);  Surgeon: Petra Kuba, MD;  Location: [facility] OR;  Service: Endoscopy;  Laterality: N/A;  . LEFT HEART CATHETERIZATION WITH CORONARY ANGIOGRAM N/A 06/11/2011   Procedure: LEFT HEART CATHETERIZATION WITH CORONARY ANGIOGRAM;  Surgeon: Kathleene Hazel, MD;  Location: [facility] CATH LAB;  Service: Cardiovascular;  Laterality: N/A;  . VAGINAL HYSTERECTOMY  1970's    MEDICATIONS: I have reviewed all medications and confirmed regimen as documented  Social History   Social History  .  Marital status: Widowed    Spouse name: N/A  . Number of children: N/A  . Years of education: N/A   Occupational History  . Not on file.   Social History Main Topics  . Smoking status: Never Smoker  . Smokeless tobacco: Never Used  . Alcohol use No  . Drug use: No  . Sexual activity: No   Other Topics Concern  . Not on file   Social History Narrative   . No narrative on file    History reviewed. No pertinent family history.  ROS: No fever, myalgias/arthralgias, unexplained weight loss or weight gain No new focal weakness or sensory deficits No otalgia, hearing loss, visual changes, nasal and sinus symptoms, mouth and throat problems No neck pain or adenopathy No abdominal pain, N/V/D, diarrhea, change in bowel pattern No dysuria, change in urinary pattern   Vitals:   09/10/16 1103  BP: 118/72  Pulse: 84  SpO2: 90%  Weight: 75.3 kg (166 lb)   Room air  EXAM:  Gen: WDWN, No overt respiratory distress at rest HEENT: NCAT, sclera white, oropharynx normal Neck: Supple without LAN, thyromegaly, JVD Lungs: breath sounds mildly diminished with very coarse crackles, right greater than left, percussion slightly dull in the right base, no wheezes are heard Cardiovascular: RRR, no murmurs noted Abdomen: Soft, nontender, normal BS Ext: without clubbing, cyanosis, edema Neuro: CNs grossly intact, motor and sensory intact Skin: Limited exam, no lesions noted  DATA:   BMP Latest Ref Rng & Units 05/27/2015 05/26/2015 05/25/2015  Glucose 65 - 99 mg/dL 323(F) 93 573(U)  BUN 6 - 20 mg/dL 11 11 16   Creatinine 0.44 - 1.00 mg/dL ) 2.02(R) 4.27(C)  Sodium 135 - 145 mmol/L 139 143 140  Potassium 3.5 - 5.1 mmol/L 3.0(L) 3.1(L) 3.1(L)  Chloride 101 - 111 mmol/L 105 107 102  CO2 22 - 32 mmol/L 28 29 28   Calcium 8.9 - 10.3 mg/dL 8.2(L) 8.4(L) 9.0    CBC Latest Ref Rng & Units 05/26/2015 05/25/2015 05/27/2013  WBC 3.6 - 11.0 K/uL 8.9 8.8 8.5  Hemoglobin 12.0 - 16.0 g/dL 05/27/2015 05/29/2013 10.9(L)  Hematocrit 35.0 - 47.0 % 37.5 38.3 32.6(L)  Platelets 150 - 440 K/uL 239 237 155    CXR(08/01/16):  Dextrocardia and situs inversus noted, bibasilar opacities slightly worse on the left  IMPRESSION:     ICD-9-CM ICD-10-CM   1. 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 Bronchiectasis without complication (HCC)  494.0 J47.9 DG Chest 2 View     Pulmonary Function Test [facility] Only     Ambulatory Referral for DME  4. DOE (dyspnea on exertion) 786.09 R06.09 DG Chest 2 View     Pulmonary Function Test [facility] Only  5. Nocturnal hypoxemia 327.24 G47.34   6. 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 Prior CT scan of her chest 2013 confirms the presence of bilateral basilar bronchiectasis. A CT scan of her abdomen in 2016 shows similar findings in the lung bases without significant progression. Prior spirometry reveals a mixed pattern of restriction and obstruction. She now has exertional hypoxemia and nocturnal hypoxemia  PLAN:  1) Continue oxygen with sleep 2) we will arrange for a portable oxygen device to be used at 2 liters per minute with exertion 3) Flutter valve provided to be used 3-4 times per day at least to help mobilize secretions 4) Chest percussion vest  ordered to be used 10-15 minutes twice a day. There is no one in the home capable of performing manual chest percussion  Follow up in 6-8 weeks with chest Xray and lung function tests   Billy Fischer, MD PCCM service Mobile [phone] Pager [phone] 09/10/2016

## 2016-09-11 NOTE — Addendum Note (Signed)
Addended by: Meyer Cory R on: 09/11/2016 08:51 AM   Modules accepted: Orders

## 2016-09-13 ENCOUNTER — Telehealth: Payer: Self-pay | Admitting: Pulmonary Disease

## 2016-09-13 NOTE — Telephone Encounter (Signed)
Please advise. Thanks.  

## 2016-09-13 NOTE — Telephone Encounter (Signed)
Called and spoke with patient. She stated that [facility] said that [facility] did not carry the POC any longer.  Message sent to [facility] with [facility] to see if this is correct.  Waiting on response from [facility]. Rhonda J Cobb

## 2016-09-13 NOTE — Telephone Encounter (Signed)
Pt states [facility] called her and told her the did not carry the portable oxygen Dr. Sung Amabile recommended. Please call and let her know if there is someone else that has this.

## 2016-09-17 NOTE — Telephone Encounter (Signed)
Pt has an appointment with [facility] for POC Eval. Advised patient to keep appointment if they didn't have what she wanted we would re-assess at that time. Rhonda J Cobb

## 2016-09-19 DIAGNOSIS — J410 Simple chronic bronchitis: Secondary | ICD-10-CM | POA: Diagnosis not present

## 2016-09-19 DIAGNOSIS — I5020 Unspecified systolic (congestive) heart failure: Secondary | ICD-10-CM | POA: Diagnosis not present

## 2016-09-19 DIAGNOSIS — R0602 Shortness of breath: Secondary | ICD-10-CM | POA: Diagnosis not present

## 2016-09-24 DIAGNOSIS — J410 Simple chronic bronchitis: Secondary | ICD-10-CM | POA: Diagnosis not present

## 2016-09-24 DIAGNOSIS — R0602 Shortness of breath: Secondary | ICD-10-CM | POA: Diagnosis not present

## 2016-09-24 DIAGNOSIS — I5020 Unspecified systolic (congestive) heart failure: Secondary | ICD-10-CM | POA: Diagnosis not present

## 2016-09-26 ENCOUNTER — Ambulatory Visit (INDEPENDENT_AMBULATORY_CARE_PROVIDER_SITE_OTHER): Payer: Medicare HMO

## 2016-09-26 DIAGNOSIS — Z5181 Encounter for therapeutic drug level monitoring: Secondary | ICD-10-CM | POA: Diagnosis not present

## 2016-09-26 DIAGNOSIS — I2510 Atherosclerotic heart disease of native coronary artery without angina pectoris: Secondary | ICD-10-CM | POA: Diagnosis not present

## 2016-09-26 DIAGNOSIS — I2692 Saddle embolus of pulmonary artery without acute cor pulmonale: Secondary | ICD-10-CM | POA: Diagnosis not present

## 2016-09-26 LAB — POCT INR: INR: 3.6

## 2016-10-01 DIAGNOSIS — M059 Rheumatoid arthritis with rheumatoid factor, unspecified: Secondary | ICD-10-CM | POA: Diagnosis not present

## 2016-10-01 DIAGNOSIS — M25569 Pain in unspecified knee: Secondary | ICD-10-CM | POA: Diagnosis not present

## 2016-10-01 DIAGNOSIS — Z79899 Other long term (current) drug therapy: Secondary | ICD-10-CM | POA: Diagnosis not present

## 2016-10-01 DIAGNOSIS — M1A09X0 Idiopathic chronic gout, multiple sites, without tophus (tophi): Secondary | ICD-10-CM | POA: Diagnosis not present

## 2016-10-17 ENCOUNTER — Ambulatory Visit (INDEPENDENT_AMBULATORY_CARE_PROVIDER_SITE_OTHER): Payer: Medicare HMO | Admitting: *Deleted

## 2016-10-17 DIAGNOSIS — I2692 Saddle embolus of pulmonary artery without acute cor pulmonale: Secondary | ICD-10-CM

## 2016-10-17 DIAGNOSIS — I2510 Atherosclerotic heart disease of native coronary artery without angina pectoris: Secondary | ICD-10-CM | POA: Diagnosis not present

## 2016-10-17 DIAGNOSIS — Z5181 Encounter for therapeutic drug level monitoring: Secondary | ICD-10-CM | POA: Diagnosis not present

## 2016-10-17 LAB — POCT INR: INR: 4.7

## 2016-10-17 MED ORDER — WARFARIN SODIUM 2 MG PO TABS: ORAL_TABLET | ORAL | 3 refills | Status: DC

## 2016-10-18 ENCOUNTER — Ambulatory Visit: Payer: Medicare HMO | Attending: Pulmonary Disease

## 2016-10-18 DIAGNOSIS — J479 Bronchiectasis, uncomplicated: Secondary | ICD-10-CM | POA: Insufficient documentation

## 2016-10-18 DIAGNOSIS — R0609 Other forms of dyspnea: Secondary | ICD-10-CM

## 2016-10-18 MED ORDER — ALBUTEROL SULFATE (2.5 MG/3ML) 0.083% IN NEBU
2.5000 mg | INHALATION_SOLUTION | Freq: Once | RESPIRATORY_TRACT | Status: AC
  Administered 2016-10-18: 2.5 mg via RESPIRATORY_TRACT
  Filled 2016-10-18: qty 3

## 2016-10-19 ENCOUNTER — Telehealth: Payer: Self-pay

## 2016-10-19 NOTE — Telephone Encounter (Signed)
2 attempts to schedule fu from recall Carotid u/s   09/25/16 patient wants to wait to schedule in May   10/19/16 patient wants to wait to schedule

## 2016-10-24 DIAGNOSIS — J410 Simple chronic bronchitis: Secondary | ICD-10-CM | POA: Diagnosis not present

## 2016-10-24 DIAGNOSIS — I5020 Unspecified systolic (congestive) heart failure: Secondary | ICD-10-CM | POA: Diagnosis not present

## 2016-10-24 DIAGNOSIS — R0602 Shortness of breath: Secondary | ICD-10-CM | POA: Diagnosis not present

## 2016-10-26 ENCOUNTER — Encounter: Payer: Self-pay | Admitting: Pulmonary Disease

## 2016-10-26 ENCOUNTER — Ambulatory Visit (INDEPENDENT_AMBULATORY_CARE_PROVIDER_SITE_OTHER): Payer: Medicare HMO | Admitting: Pulmonary Disease

## 2016-10-26 ENCOUNTER — Ambulatory Visit
Admission: RE | Admit: 2016-10-26 | Discharge: 2016-10-26 | Disposition: A | Discharge status: Home / Self Care | Payer: Medicare HMO | Source: Ambulatory Visit | Attending: Pulmonary Disease | Admitting: Pulmonary Disease

## 2016-10-26 VITALS — BP 118/86 | HR 87 | Ht 66.0 in | Wt 165.0 lb

## 2016-10-26 DIAGNOSIS — Q348 Other specified congenital malformations of respiratory system: Secondary | ICD-10-CM

## 2016-10-26 DIAGNOSIS — Q893 Situs inversus: Secondary | ICD-10-CM

## 2016-10-26 DIAGNOSIS — I517 Cardiomegaly: Secondary | ICD-10-CM | POA: Insufficient documentation

## 2016-10-26 DIAGNOSIS — J479 Bronchiectasis, uncomplicated: Secondary | ICD-10-CM

## 2016-10-26 DIAGNOSIS — Z951 Presence of aortocoronary bypass graft: Secondary | ICD-10-CM | POA: Diagnosis not present

## 2016-10-26 DIAGNOSIS — J9611 Chronic respiratory failure with hypoxia: Secondary | ICD-10-CM | POA: Diagnosis not present

## 2016-10-26 DIAGNOSIS — J849 Interstitial pulmonary disease, unspecified: Secondary | ICD-10-CM | POA: Diagnosis not present

## 2016-10-26 DIAGNOSIS — J984 Other disorders of lung: Secondary | ICD-10-CM | POA: Diagnosis not present

## 2016-10-26 DIAGNOSIS — R0609 Other forms of dyspnea: Secondary | ICD-10-CM | POA: Diagnosis not present

## 2016-10-26 DIAGNOSIS — J449 Chronic obstructive pulmonary disease, unspecified: Secondary | ICD-10-CM | POA: Diagnosis not present

## 2016-10-26 DIAGNOSIS — J9811 Atelectasis: Secondary | ICD-10-CM | POA: Diagnosis not present

## 2016-10-26 MED ORDER — BUDESONIDE 0.5 MG/2ML IN SUSP: 0.5000 mg | Freq: Two times a day (BID) | RESPIRATORY_TRACT | 12 refills | Status: DC

## 2016-10-26 MED ORDER — FORMOTEROL FUMARATE 20 MCG/2ML IN NEBU: 20.0000 ug | INHALATION_SOLUTION | Freq: Two times a day (BID) | RESPIRATORY_TRACT | 12 refills | Status: DC

## 2016-10-26 NOTE — Patient Instructions (Addendum)
You have severe airflow obstruction (COPD) as measured by recent lung function tests. This is due to bronchiectasis/situs inversus  Your chest x-ray continues to show changes of bronchiectasis but it does not look any worse than prior x-rays  Continue oxygen therapy as previously prescribed  Continue chest percussion vest twice a day as previously prescribed  We will arrange for nebulized breathing treatments (budesonide and formoterol) through [facility]  Follow-up in 6-8 weeks

## 2016-10-29 NOTE — Progress Notes (Signed)
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 Prior CT chest and CTAP reveal BLL bronchiectasis. Pt has long standing chronic sinusitis. Dx of immotile cilia syndrome made. Flutter valve and chest percussion vest initiated. Also has history of PE 2013 on chronic warfarin  DATA: 10/08/11 Spirometry: FVC:  1.68 L ( 53 %pred), FEV1:  1.14 L ( 48 %pred), FEV1/FVC: 68% 08/28/16 Echocardiogram: normal LVEF. No evidence of pulmoanry hypertension 10/18/16 PFTs: poor quality test. Very severe obstruction (FEV1 0.72 L, 34 % pred). Lung volumes could not be completed. Very severe reduction in DLCO   SUBJ:   No significant change overall. Remains severely limited by dyspnea. She is wearing O2 with sleep and exertion. Last visit we initiated chest percussion vest as flutter valve was insufficiently effective and there is no one in her home that can perform manual chest percussion. She is using the chest percussion vest as prescribed and believes it is modestly beneficial in helping her mobilize secretions. Denies CP, fever, purulent sputum, hemoptysis, LE edema and calf tenderness.  Vitals:   10/26/16 1129 10/26/16 1134  BP:  118/86  Pulse:  87  SpO2:  92%  Weight: 165 lb (74.8 kg)   Height: 5\' 6"  (1.676 m)   Room air  EXAM:   Gen: WDWN in NAD HEENT: NCAT, sclerae white, oropharynx normal Neck: NO LAN, no JVD noted Lungs: diminished BS, bibasilar crackles, no wheezes Cardiovascular: Reg rate, normal rhythm, no M noted Abdomen: Soft, NT, +BS Ext: no C/C/E Neuro: PERRL, EOMI, motor/sensory grossly intact Skin: No lesions noted   DATA:   BMP Latest Ref Rng & Units 05/27/2015 05/26/2015 05/25/2015  Glucose 65 - 99 mg/dL 05/27/2015) 93 527(P)  BUN 6 - 20 mg/dL 11 11 16   Creatinine 0.44 - 1.00 mg/dL 824(M)  ) 3.53(I)  Sodium 135 - 145 mmol/L 139 143 140  Potassium 3.5 - 5.1 mmol/L 3.0(L) 3.1(L) 3.1(L)  Chloride 101 - 111 mmol/L 105 107 102  CO2 22 - 32 mmol/L 28 29 28   Calcium 8.9 - 10.3 mg/dL 8.2(L) 8.4(L) 9.0    CBC Latest Ref Rng & Units 05/26/2015 05/25/2015 05/27/2013  WBC 3.6 - 11.0 K/uL 8.9 8.8 8.5  Hemoglobin 12.0 - 16.0 g/dL 05/28/2015 05/27/2015 10.9(L)  Hematocrit 35.0 - 47.0 % 37.5 38.3 32.6(L)  Platelets 150 - 440 K/uL 239 237 155    CXR(10/26/16):  Situs inversus, NAD  IMPRESSION:     ICD-9-CM ICD-10-CM   1. 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 Bronchiectasis without complication (HCC) 494.0 J47.9   3. Chronic hypoxemic respiratory failure (HCC) 518.83 J96.11    799.02    4. Situs inversus 759.3 Q89.3   5. 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 The nebulized meds can be administered at same time as chest percussion  Follow up in 6-8 weeks to assess response to the above additions    08.6, MD PCCM service Mobile [phone] Pager [phone]  10/29/2016  

## 2016-10-31 DIAGNOSIS — J449 Chronic obstructive pulmonary disease, unspecified: Secondary | ICD-10-CM | POA: Diagnosis not present

## 2016-10-31 DIAGNOSIS — J479 Bronchiectasis, uncomplicated: Secondary | ICD-10-CM | POA: Diagnosis not present

## 2016-10-31 DIAGNOSIS — J9611 Chronic respiratory failure with hypoxia: Secondary | ICD-10-CM | POA: Diagnosis not present

## 2016-10-31 DIAGNOSIS — J984 Other disorders of lung: Secondary | ICD-10-CM | POA: Diagnosis not present

## 2016-10-31 NOTE — Telephone Encounter (Signed)
Called patient to schedule fu carotid u/s .  No ans no vm 3 attempts to schedule fu appt from recall list.   Mailed letter

## 2016-11-02 DIAGNOSIS — J479 Bronchiectasis, uncomplicated: Secondary | ICD-10-CM | POA: Diagnosis not present

## 2016-11-02 DIAGNOSIS — J984 Other disorders of lung: Secondary | ICD-10-CM | POA: Diagnosis not present

## 2016-11-05 ENCOUNTER — Telehealth: Payer: Self-pay | Admitting: Pulmonary Disease

## 2016-11-05 NOTE — Telephone Encounter (Signed)
RXs faxed to [facility]. Nothing further needed.

## 2016-11-05 NOTE — Telephone Encounter (Signed)
Pt requesting Rx for nebulizer medication to be sent to the [facility] mail order pharmacy.   Rx was for budesonide 0.5 MG/2ML nebulizer solution and formoterol 20 MCG/2ML nebulizer solution.    Please advise. Rhonda J Cobb

## 2016-11-07 ENCOUNTER — Ambulatory Visit (INDEPENDENT_AMBULATORY_CARE_PROVIDER_SITE_OTHER): Payer: Medicare HMO | Admitting: *Deleted

## 2016-11-07 ENCOUNTER — Telehealth: Payer: Self-pay | Admitting: Pulmonary Disease

## 2016-11-07 ENCOUNTER — Other Ambulatory Visit: Payer: Self-pay | Admitting: Pulmonary Disease

## 2016-11-07 DIAGNOSIS — I2692 Saddle embolus of pulmonary artery without acute cor pulmonale: Secondary | ICD-10-CM

## 2016-11-07 DIAGNOSIS — J479 Bronchiectasis, uncomplicated: Secondary | ICD-10-CM

## 2016-11-07 DIAGNOSIS — J449 Chronic obstructive pulmonary disease, unspecified: Secondary | ICD-10-CM | POA: Diagnosis not present

## 2016-11-07 DIAGNOSIS — J984 Other disorders of lung: Secondary | ICD-10-CM | POA: Diagnosis not present

## 2016-11-07 DIAGNOSIS — Z5181 Encounter for therapeutic drug level monitoring: Secondary | ICD-10-CM | POA: Diagnosis not present

## 2016-11-07 DIAGNOSIS — I2510 Atherosclerotic heart disease of native coronary artery without angina pectoris: Secondary | ICD-10-CM | POA: Diagnosis not present

## 2016-11-07 DIAGNOSIS — J471 Bronchiectasis with (acute) exacerbation: Secondary | ICD-10-CM

## 2016-11-07 DIAGNOSIS — J9611 Chronic respiratory failure with hypoxia: Secondary | ICD-10-CM | POA: Diagnosis not present

## 2016-11-07 LAB — POCT INR: INR: 3.8

## 2016-11-07 NOTE — Telephone Encounter (Signed)
Pt calling asking if we can change her company where we send her oxygen  She would like it to ben sent to [place] She is needing a smaller unit and she was told they have it Would like [place] to call it in to them instead of [facility]

## 2016-11-07 NOTE — Telephone Encounter (Signed)
Order placed

## 2016-11-08 ENCOUNTER — Telehealth: Payer: Self-pay | Admitting: *Deleted

## 2016-11-08 NOTE — Telephone Encounter (Signed)
PA pending for Perforomist Key [id number]

## 2016-11-14 ENCOUNTER — Telehealth: Payer: Self-pay | Admitting: Pulmonary Disease

## 2016-11-14 MED ORDER — FLUTICASONE FUROATE-VILANTEROL 200-25 MCG/INH IN AEPB: 1.0000 | INHALATION_SPRAY | Freq: Every day | RESPIRATORY_TRACT | 5 refills | Status: DC

## 2016-11-14 MED ORDER — IPRATROPIUM-ALBUTEROL 0.5-2.5 (3) MG/3ML IN SOLN: 3.0000 mL | Freq: Four times a day (QID) | RESPIRATORY_TRACT | 5 refills | Status: AC | PRN

## 2016-11-14 NOTE — Telephone Encounter (Signed)
Patient aware of medication change. 

## 2016-11-14 NOTE — Telephone Encounter (Signed)
Patient states she can not afford nebulizer medications budesonide and formoterol. She has tried both DME companies [employer] and [facility] [place] and both are over $200. She wants to know if you can change medications to something cheaper.

## 2016-11-14 NOTE — Telephone Encounter (Signed)
Pt states she has her nebulizer, but does not have the medication. She needs a less expensive medication. Please call and advise.

## 2016-11-14 NOTE — Telephone Encounter (Signed)
I have discontinued budesonide and formoterol from her medication list.  Breo 200-25 one inhalation daily DuoNeb - 1 vial nebulized every 6 hours as needed for shortness of breath and cough  These orders have been entered  Billy Fischer, MD PCCM service Mobile [phone] Pager [phone] 11/14/2016 3:36 PM

## 2016-11-21 ENCOUNTER — Ambulatory Visit (INDEPENDENT_AMBULATORY_CARE_PROVIDER_SITE_OTHER): Payer: Medicare HMO | Admitting: *Deleted

## 2016-11-21 DIAGNOSIS — I2692 Saddle embolus of pulmonary artery without acute cor pulmonale: Secondary | ICD-10-CM

## 2016-11-21 DIAGNOSIS — Z5181 Encounter for therapeutic drug level monitoring: Secondary | ICD-10-CM | POA: Diagnosis not present

## 2016-11-21 DIAGNOSIS — I2510 Atherosclerotic heart disease of native coronary artery without angina pectoris: Secondary | ICD-10-CM

## 2016-11-21 LAB — POCT INR: INR: 2.8

## 2016-12-02 DIAGNOSIS — J479 Bronchiectasis, uncomplicated: Secondary | ICD-10-CM | POA: Diagnosis not present

## 2016-12-02 DIAGNOSIS — J984 Other disorders of lung: Secondary | ICD-10-CM | POA: Diagnosis not present

## 2016-12-07 DIAGNOSIS — J479 Bronchiectasis, uncomplicated: Secondary | ICD-10-CM | POA: Diagnosis not present

## 2016-12-07 DIAGNOSIS — J9611 Chronic respiratory failure with hypoxia: Secondary | ICD-10-CM | POA: Diagnosis not present

## 2016-12-07 DIAGNOSIS — J984 Other disorders of lung: Secondary | ICD-10-CM | POA: Diagnosis not present

## 2016-12-07 DIAGNOSIS — J449 Chronic obstructive pulmonary disease, unspecified: Secondary | ICD-10-CM | POA: Diagnosis not present

## 2016-12-10 ENCOUNTER — Ambulatory Visit (INDEPENDENT_AMBULATORY_CARE_PROVIDER_SITE_OTHER): Payer: Medicare HMO | Admitting: Pulmonary Disease

## 2016-12-10 ENCOUNTER — Encounter: Payer: Self-pay | Admitting: Pulmonary Disease

## 2016-12-10 VITALS — BP 130/78 | HR 85 | Resp 16 | Ht 62.0 in | Wt 162.0 lb

## 2016-12-10 DIAGNOSIS — J479 Bronchiectasis, uncomplicated: Secondary | ICD-10-CM | POA: Diagnosis not present

## 2016-12-10 DIAGNOSIS — J9611 Chronic respiratory failure with hypoxia: Secondary | ICD-10-CM | POA: Diagnosis not present

## 2016-12-10 DIAGNOSIS — Q893 Situs inversus: Secondary | ICD-10-CM

## 2016-12-10 NOTE — Patient Instructions (Signed)
Continue chest percussion vest twice a day  I would like you to increase the frequency of nebulizer use - suggest using it at least twice a day while you are doing chest percussion and you may use at another time or 2 in between those sessions  Continue oxygen therapy with exertion and sleep  Follow-up in 4-6 months

## 2016-12-11 DIAGNOSIS — G629 Polyneuropathy, unspecified: Secondary | ICD-10-CM | POA: Diagnosis not present

## 2016-12-11 DIAGNOSIS — M109 Gout, unspecified: Secondary | ICD-10-CM | POA: Diagnosis not present

## 2016-12-11 DIAGNOSIS — F419 Anxiety disorder, unspecified: Secondary | ICD-10-CM | POA: Diagnosis not present

## 2016-12-11 DIAGNOSIS — R600 Localized edema: Secondary | ICD-10-CM | POA: Diagnosis not present

## 2016-12-11 DIAGNOSIS — E782 Mixed hyperlipidemia: Secondary | ICD-10-CM | POA: Diagnosis not present

## 2016-12-11 DIAGNOSIS — N183 Chronic kidney disease, stage 3 (moderate): Secondary | ICD-10-CM | POA: Diagnosis not present

## 2016-12-11 DIAGNOSIS — E0842 Diabetes mellitus due to underlying condition with diabetic polyneuropathy: Secondary | ICD-10-CM | POA: Diagnosis not present

## 2016-12-11 DIAGNOSIS — I129 Hypertensive chronic kidney disease with stage 1 through stage 4 chronic kidney disease, or unspecified chronic kidney disease: Secondary | ICD-10-CM | POA: Diagnosis not present

## 2016-12-11 DIAGNOSIS — E1142 Type 2 diabetes mellitus with diabetic polyneuropathy: Secondary | ICD-10-CM | POA: Diagnosis not present

## 2016-12-11 DIAGNOSIS — K219 Gastro-esophageal reflux disease without esophagitis: Secondary | ICD-10-CM | POA: Diagnosis not present

## 2016-12-11 NOTE — Progress Notes (Signed)
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 Prior CT chest and CTAP reveal BLL bronchiectasis. Pt has long standing chronic sinusitis. Dx of immotile cilia syndrome made. Flutter valve and chest percussion vest initiated. Also has history of PE 2013 on chronic warfarin  DATA: 10/08/11 Spirometry: FVC:  1.68 L ( 53 %pred), FEV1:  1.14 L ( 48 %pred), FEV1/FVC: 68% 08/28/16 Echocardiogram: normal LVEF. No evidence of pulmoanry hypertension 10/18/16 PFTs: poor quality test. Very severe obstruction (FEV1 0.72 L, 34 % pred). Lung volumes could not be completed. Very severe reduction in DLCO   SUBJ:   Routine re-eval. She thinks she is "a little bit better". No new complaints. She is not using Breo due to cost. She uses Duoneb 1-2 times per day. Wearing O2 with exertion and sleep. Denies CP, fever, purulent sputum, hemoptysis, LE edema and calf tenderness.  Vitals:   12/10/16 1041  BP: 130/78  Pulse: 85  Resp: 16  SpO2: 94%  Weight: 162 lb (73.5 kg)  Height: 5\' 2"  (1.575 m)  Room air  EXAM:   Gen: NAD HEENT: WNL Lungs: diminished BS, bibasilar crackles, no wheezes Cardiovascular: Reg, no M noted Abdomen: Soft, NT, +BS Ext: no C/C/E Neuro: grossly intact   DATA:   BMP Latest Ref Rng & Units 05/27/2015 05/26/2015 05/25/2015  Glucose 65 - 99 mg/dL 05/27/2015) 93 710(G)  BUN 6 - 20 mg/dL 11 11 16   Creatinine 0.44 - 1.00 mg/dL 269(S) ) 8.54(O)  Sodium 135 - 145 mmol/L 139 143 140  Potassium 3.5 - 5.1 mmol/L 3.0(L) 3.1(L) 3.1(L)  Chloride 101 - 111 mmol/L 105 107 102  CO2 22 - 32 mmol/L 28 29 28   Calcium 8.9 - 10.3 mg/dL 8.2(L) 8.4(L) 9.0    CBC Latest Ref Rng & Units 05/26/2015 05/25/2015 05/27/2013  WBC 3.6 - 11.0 K/uL 8.9 8.8 8.5  Hemoglobin 12.0 - 16.0 g/dL 05/28/2015 05/27/2015 10.9(L)   Hematocrit 35.0 - 47.0 % 37.5 38.3 32.6(L)  Platelets 150 - 440 K/uL 239 237 155    CXR:  NNF  IMPRESSION:     ICD-10-CM   1. 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 Bronchiectasis without complication (HCC) J47.9     PLAN:  Continue chest percussion vest twice a day  Encouraged to increase the frequency of nebulizer use - suggested using it at least twice a day while doing chest percussion and another time or 2 in between those sessions as needed  Continue oxygen therapy with exertion and sleep  Follow-up in 4-6 months  05/29/2013, MD PCCM service Mobile [phone] Pager [phone] 12/11/2016     Continue chest percussion vest twice a day  I would like you to increase the frequency of nebulizer use - suggest using it at least twice a day while you are doing chest percussion and you may use at another time or 2 in between those sessions  Continue oxygen therapy with exertion and sleep  Follow-up in 4-6 months

## 2016-12-12 ENCOUNTER — Ambulatory Visit (INDEPENDENT_AMBULATORY_CARE_PROVIDER_SITE_OTHER): Payer: Medicare HMO | Admitting: *Deleted

## 2016-12-12 DIAGNOSIS — I2692 Saddle embolus of pulmonary artery without acute cor pulmonale: Secondary | ICD-10-CM

## 2016-12-12 DIAGNOSIS — I2510 Atherosclerotic heart disease of native coronary artery without angina pectoris: Secondary | ICD-10-CM | POA: Diagnosis not present

## 2016-12-12 DIAGNOSIS — Z5181 Encounter for therapeutic drug level monitoring: Secondary | ICD-10-CM

## 2016-12-12 LAB — POCT INR: INR: 3.5

## 2016-12-28 ENCOUNTER — Other Ambulatory Visit: Payer: Self-pay | Admitting: Family Medicine

## 2016-12-28 ENCOUNTER — Ambulatory Visit
Admission: RE | Admit: 2016-12-28 | Discharge: 2016-12-28 | Disposition: A | Discharge status: Home / Self Care | Payer: Medicare HMO | Source: Ambulatory Visit | Attending: Family Medicine | Admitting: Family Medicine

## 2016-12-28 DIAGNOSIS — M4186 Other forms of scoliosis, lumbar region: Secondary | ICD-10-CM | POA: Diagnosis not present

## 2016-12-28 DIAGNOSIS — M545 Low back pain, unspecified: Secondary | ICD-10-CM

## 2016-12-28 DIAGNOSIS — Z1389 Encounter for screening for other disorder: Secondary | ICD-10-CM | POA: Diagnosis not present

## 2016-12-30 ENCOUNTER — Encounter: Payer: Self-pay | Admitting: Emergency Medicine

## 2016-12-30 ENCOUNTER — Inpatient Hospital Stay
Admission: EM | Admit: 2016-12-30 | Discharge: 2017-01-03 | DRG: 478 | Disposition: A | Discharge status: Skilled Nursing Facility | Payer: Medicare HMO | Attending: Internal Medicine | Admitting: Internal Medicine

## 2016-12-30 ENCOUNTER — Emergency Department: Payer: Medicare HMO

## 2016-12-30 DIAGNOSIS — Z951 Presence of aortocoronary bypass graft: Secondary | ICD-10-CM

## 2016-12-30 DIAGNOSIS — I5022 Chronic systolic (congestive) heart failure: Secondary | ICD-10-CM | POA: Diagnosis not present

## 2016-12-30 DIAGNOSIS — Z7984 Long term (current) use of oral hypoglycemic drugs: Secondary | ICD-10-CM

## 2016-12-30 DIAGNOSIS — S22088D Other fracture of T11-T12 vertebra, subsequent encounter for fracture with routine healing: Secondary | ICD-10-CM | POA: Diagnosis not present

## 2016-12-30 DIAGNOSIS — E1142 Type 2 diabetes mellitus with diabetic polyneuropathy: Secondary | ICD-10-CM | POA: Diagnosis present

## 2016-12-30 DIAGNOSIS — R791 Abnormal coagulation profile: Secondary | ICD-10-CM | POA: Diagnosis present

## 2016-12-30 DIAGNOSIS — T402X5A Adverse effect of other opioids, initial encounter: Secondary | ICD-10-CM | POA: Diagnosis present

## 2016-12-30 DIAGNOSIS — N189 Chronic kidney disease, unspecified: Secondary | ICD-10-CM | POA: Diagnosis not present

## 2016-12-30 DIAGNOSIS — Z880 Allergy status to penicillin: Secondary | ICD-10-CM

## 2016-12-30 DIAGNOSIS — R112 Nausea with vomiting, unspecified: Secondary | ICD-10-CM | POA: Diagnosis not present

## 2016-12-30 DIAGNOSIS — S32000A Wedge compression fracture of unspecified lumbar vertebra, initial encounter for closed fracture: Secondary | ICD-10-CM | POA: Diagnosis not present

## 2016-12-30 DIAGNOSIS — K219 Gastro-esophageal reflux disease without esophagitis: Secondary | ICD-10-CM | POA: Diagnosis not present

## 2016-12-30 DIAGNOSIS — I110 Hypertensive heart disease with heart failure: Secondary | ICD-10-CM | POA: Diagnosis present

## 2016-12-30 DIAGNOSIS — J984 Other disorders of lung: Secondary | ICD-10-CM | POA: Diagnosis not present

## 2016-12-30 DIAGNOSIS — I252 Old myocardial infarction: Secondary | ICD-10-CM

## 2016-12-30 DIAGNOSIS — I255 Ischemic cardiomyopathy: Secondary | ICD-10-CM | POA: Diagnosis present

## 2016-12-30 DIAGNOSIS — Z79899 Other long term (current) drug therapy: Secondary | ICD-10-CM | POA: Diagnosis not present

## 2016-12-30 DIAGNOSIS — R1111 Vomiting without nausea: Secondary | ICD-10-CM | POA: Diagnosis not present

## 2016-12-30 DIAGNOSIS — R1110 Vomiting, unspecified: Secondary | ICD-10-CM | POA: Diagnosis not present

## 2016-12-30 DIAGNOSIS — M4854XA Collapsed vertebra, not elsewhere classified, thoracic region, initial encounter for fracture: Secondary | ICD-10-CM | POA: Diagnosis not present

## 2016-12-30 DIAGNOSIS — S22080A Wedge compression fracture of T11-T12 vertebra, initial encounter for closed fracture: Secondary | ICD-10-CM | POA: Diagnosis not present

## 2016-12-30 DIAGNOSIS — I6529 Occlusion and stenosis of unspecified carotid artery: Secondary | ICD-10-CM | POA: Diagnosis not present

## 2016-12-30 DIAGNOSIS — Z86711 Personal history of pulmonary embolism: Secondary | ICD-10-CM | POA: Diagnosis not present

## 2016-12-30 DIAGNOSIS — S22000A Wedge compression fracture of unspecified thoracic vertebra, initial encounter for closed fracture: Secondary | ICD-10-CM | POA: Diagnosis not present

## 2016-12-30 DIAGNOSIS — M546 Pain in thoracic spine: Secondary | ICD-10-CM | POA: Diagnosis present

## 2016-12-30 DIAGNOSIS — E119 Type 2 diabetes mellitus without complications: Secondary | ICD-10-CM | POA: Diagnosis not present

## 2016-12-30 DIAGNOSIS — Z86718 Personal history of other venous thrombosis and embolism: Secondary | ICD-10-CM

## 2016-12-30 DIAGNOSIS — Z7401 Bed confinement status: Secondary | ICD-10-CM | POA: Diagnosis not present

## 2016-12-30 DIAGNOSIS — E785 Hyperlipidemia, unspecified: Secondary | ICD-10-CM | POA: Diagnosis not present

## 2016-12-30 DIAGNOSIS — Z419 Encounter for procedure for purposes other than remedying health state, unspecified: Secondary | ICD-10-CM

## 2016-12-30 DIAGNOSIS — I509 Heart failure, unspecified: Secondary | ICD-10-CM | POA: Diagnosis not present

## 2016-12-30 DIAGNOSIS — S229XXA Fracture of bony thorax, part unspecified, initial encounter for closed fracture: Secondary | ICD-10-CM | POA: Diagnosis not present

## 2016-12-30 DIAGNOSIS — M4850XA Collapsed vertebra, not elsewhere classified, site unspecified, initial encounter for fracture: Secondary | ICD-10-CM | POA: Diagnosis present

## 2016-12-30 DIAGNOSIS — I2510 Atherosclerotic heart disease of native coronary artery without angina pectoris: Secondary | ICD-10-CM | POA: Diagnosis present

## 2016-12-30 DIAGNOSIS — I130 Hypertensive heart and chronic kidney disease with heart failure and stage 1 through stage 4 chronic kidney disease, or unspecified chronic kidney disease: Secondary | ICD-10-CM | POA: Diagnosis not present

## 2016-12-30 DIAGNOSIS — D649 Anemia, unspecified: Secondary | ICD-10-CM | POA: Diagnosis not present

## 2016-12-30 DIAGNOSIS — Z7901 Long term (current) use of anticoagulants: Secondary | ICD-10-CM | POA: Diagnosis not present

## 2016-12-30 DIAGNOSIS — E860 Dehydration: Secondary | ICD-10-CM | POA: Diagnosis not present

## 2016-12-30 DIAGNOSIS — M069 Rheumatoid arthritis, unspecified: Secondary | ICD-10-CM | POA: Diagnosis present

## 2016-12-30 DIAGNOSIS — S329XXA Fracture of unspecified parts of lumbosacral spine and pelvis, initial encounter for closed fracture: Secondary | ICD-10-CM | POA: Diagnosis not present

## 2016-12-30 DIAGNOSIS — J479 Bronchiectasis, uncomplicated: Secondary | ICD-10-CM | POA: Diagnosis not present

## 2016-12-30 HISTORY — DX: Rheumatoid arthritis, unspecified: M06.9

## 2016-12-30 LAB — URINALYSIS, COMPLETE (UACMP) WITH MICROSCOPIC
Bilirubin Urine: NEGATIVE
Glucose, UA: NEGATIVE mg/dL
HGB URINE DIPSTICK: NEGATIVE
KETONES UR: NEGATIVE mg/dL
LEUKOCYTES UA: NEGATIVE
Nitrite: NEGATIVE
PROTEIN: NEGATIVE mg/dL
RBC / HPF: NONE SEEN RBC/hpf (ref 0–5)
Specific Gravity, Urine: 1.005 (ref 1.005–1.030)
pH: 6 (ref 5.0–8.0)

## 2016-12-30 LAB — COMPREHENSIVE METABOLIC PANEL
ALK PHOS: 93 U/L (ref 38–126)
ALT: 22 U/L (ref 14–54)
AST: 31 U/L (ref 15–41)
Albumin: 3.6 g/dL (ref 3.5–5.0)
Anion gap: 11 (ref 5–15)
BILIRUBIN TOTAL: 1.3 mg/dL — AB (ref 0.3–1.2)
BUN: 21 mg/dL — AB (ref 6–20)
CALCIUM: 9.1 mg/dL (ref 8.9–10.3)
CHLORIDE: 95 mmol/L — AB (ref 101–111)
CO2: 31 mmol/L (ref 22–32)
CREATININE: 1.18 mg/dL — AB (ref 0.44–1.00)
GFR calc Af Amer: 51 mL/min — ABNORMAL LOW (ref >60)
GFR, EST NON AFRICAN AMERICAN: 44 mL/min — AB (ref >60)
Glucose, Bld: 144 mg/dL — ABNORMAL HIGH (ref 65–99)
Potassium: 3.2 mmol/L — ABNORMAL LOW (ref 3.5–5.1)
Sodium: 137 mmol/L (ref 135–145)
TOTAL PROTEIN: 7.5 g/dL (ref 6.5–8.1)

## 2016-12-30 LAB — CBC
HCT: 36.9 % (ref 35.0–47.0)
Hemoglobin: 12.4 g/dL (ref 12.0–16.0)
MCH: 32.7 pg (ref 26.0–34.0)
MCHC: 33.5 g/dL (ref 32.0–36.0)
MCV: 97.6 fL (ref 80.0–100.0)
PLATELETS: 256 10*3/uL (ref 150–440)
RBC: 3.78 MIL/uL — ABNORMAL LOW (ref 3.80–5.20)
RDW: 16.8 % — AB (ref 11.5–14.5)
WBC: 12.4 10*3/uL — AB (ref 3.6–11.0)

## 2016-12-30 LAB — LIPASE, BLOOD: Lipase: 28 U/L (ref 11–51)

## 2016-12-30 LAB — GLUCOSE, CAPILLARY
Glucose-Capillary: 102 mg/dL — ABNORMAL HIGH (ref 65–99)
Glucose-Capillary: 89 mg/dL (ref 65–99)

## 2016-12-30 LAB — TROPONIN I

## 2016-12-30 LAB — PROTIME-INR
INR: 3.94
Prothrombin Time: 39.5 seconds — ABNORMAL HIGH (ref 11.4–15.2)

## 2016-12-30 MED ORDER — ONDANSETRON HCL 4 MG PO TABS: 4.0000 mg | ORAL_TABLET | Freq: Four times a day (QID) | ORAL | Status: DC | PRN

## 2016-12-30 MED ORDER — MORPHINE SULFATE (PF) 2 MG/ML IV SOLN
INTRAVENOUS | Status: AC
  Administered 2016-12-30: 2 mg via INTRAVENOUS
  Filled 2016-12-30: qty 1

## 2016-12-30 MED ORDER — INSULIN ASPART 100 UNIT/ML ~~LOC~~ SOLN
0.0000 [IU] | Freq: Three times a day (TID) | SUBCUTANEOUS | Status: DC
  Administered 2017-01-01 – 2017-01-03 (×3): 1 [IU] via SUBCUTANEOUS
  Administered 2017-01-03: 2 [IU] via SUBCUTANEOUS
  Filled 2016-12-30 (×4): qty 1

## 2016-12-30 MED ORDER — FENTANYL CITRATE (PF) 100 MCG/2ML IJ SOLN
50.0000 ug | Freq: Once | INTRAMUSCULAR | Status: AC
  Administered 2016-12-30: 50 ug via INTRAVENOUS
  Filled 2016-12-30: qty 2

## 2016-12-30 MED ORDER — MORPHINE BOLUS VIA INFUSION: 2.0000 mg | INTRAVENOUS | Status: DC | PRN

## 2016-12-30 MED ORDER — ACETAMINOPHEN 325 MG PO TABS: 650.0000 mg | ORAL_TABLET | Freq: Four times a day (QID) | ORAL | Status: DC | PRN

## 2016-12-30 MED ORDER — METHOTREXATE 2.5 MG PO TABS
20.0000 mg | ORAL_TABLET | ORAL | Status: DC
  Administered 2016-12-31: 20 mg via ORAL
  Filled 2016-12-30: qty 8

## 2016-12-30 MED ORDER — SODIUM CHLORIDE 0.9 % IV SOLN
INTRAVENOUS | Status: DC
  Administered 2016-12-30 – 2016-12-31 (×2): via INTRAVENOUS

## 2016-12-30 MED ORDER — ALENDRONATE SODIUM 70 MG PO TABS: 70.0000 mg | ORAL_TABLET | ORAL | Status: DC

## 2016-12-30 MED ORDER — MORPHINE SULFATE (PF) 2 MG/ML IV SOLN
2.0000 mg | INTRAVENOUS | Status: DC | PRN
  Administered 2016-12-30 – 2016-12-31 (×3): 2 mg via INTRAVENOUS
  Filled 2016-12-30 (×4): qty 1

## 2016-12-30 MED ORDER — MORPHINE SULFATE (PF) 2 MG/ML IV SOLN
2.0000 mg | Freq: Once | INTRAVENOUS | Status: AC
  Administered 2016-12-30: 2 mg via INTRAVENOUS

## 2016-12-30 MED ORDER — ONDANSETRON HCL 4 MG/2ML IJ SOLN
4.0000 mg | Freq: Four times a day (QID) | INTRAMUSCULAR | Status: DC | PRN
  Administered 2017-01-03: 4 mg via INTRAVENOUS
  Filled 2016-12-30: qty 2

## 2016-12-30 MED ORDER — OXYCODONE HCL 5 MG PO TABS
5.0000 mg | ORAL_TABLET | ORAL | Status: DC | PRN
  Administered 2017-01-01 – 2017-01-03 (×8): 5 mg via ORAL
  Filled 2016-12-30 (×8): qty 1

## 2016-12-30 MED ORDER — SODIUM CHLORIDE 0.9 % IV BOLUS (SEPSIS)
250.0000 mL | Freq: Once | INTRAVENOUS | Status: AC
Start: 2016-12-30 — End: 2016-12-30
  Administered 2016-12-30: 250 mL via INTRAVENOUS

## 2016-12-30 MED ORDER — METOPROLOL TARTRATE 25 MG PO TABS
25.0000 mg | ORAL_TABLET | Freq: Two times a day (BID) | ORAL | Status: DC
  Administered 2016-12-30 – 2017-01-02 (×7): 25 mg via ORAL
  Filled 2016-12-30 (×8): qty 1

## 2016-12-30 MED ORDER — FENTANYL CITRATE (PF) 100 MCG/2ML IJ SOLN
25.0000 ug | Freq: Once | INTRAMUSCULAR | Status: AC
  Administered 2016-12-30: 25 ug via INTRAVENOUS
  Filled 2016-12-30: qty 2

## 2016-12-30 MED ORDER — INSULIN ASPART 100 UNIT/ML ~~LOC~~ SOLN: 0.0000 [IU] | Freq: Every day | SUBCUTANEOUS | Status: DC

## 2016-12-30 MED ORDER — GABAPENTIN 300 MG PO CAPS
300.0000 mg | ORAL_CAPSULE | Freq: Two times a day (BID) | ORAL | Status: DC
  Administered 2016-12-30 – 2017-01-02 (×7): 300 mg via ORAL
  Filled 2016-12-30 (×8): qty 1

## 2016-12-30 MED ORDER — IPRATROPIUM-ALBUTEROL 0.5-2.5 (3) MG/3ML IN SOLN: 3.0000 mL | Freq: Four times a day (QID) | RESPIRATORY_TRACT | Status: DC | PRN

## 2016-12-30 MED ORDER — SIMVASTATIN 20 MG PO TABS
20.0000 mg | ORAL_TABLET | Freq: Every evening | ORAL | Status: DC
  Administered 2016-12-30 – 2017-01-02 (×4): 20 mg via ORAL
  Filled 2016-12-30 (×4): qty 1

## 2016-12-30 MED ORDER — ACETAMINOPHEN 650 MG RE SUPP: 650.0000 mg | Freq: Four times a day (QID) | RECTAL | Status: DC | PRN

## 2016-12-30 MED ORDER — ONDANSETRON HCL 4 MG/2ML IJ SOLN
4.0000 mg | Freq: Once | INTRAMUSCULAR | Status: AC
  Administered 2016-12-30: 4 mg via INTRAVENOUS
  Filled 2016-12-30: qty 2

## 2016-12-30 MED ORDER — FOLIC ACID 1 MG PO TABS
1000.0000 ug | ORAL_TABLET | Freq: Every day | ORAL | Status: DC
  Administered 2016-12-31 – 2017-01-01 (×2): 1 mg via ORAL
  Filled 2016-12-30 (×3): qty 1

## 2016-12-30 MED ORDER — PANTOPRAZOLE SODIUM 40 MG PO TBEC
40.0000 mg | DELAYED_RELEASE_TABLET | Freq: Every day | ORAL | Status: DC
  Administered 2016-12-31 – 2017-01-01 (×2): 40 mg via ORAL
  Filled 2016-12-30 (×3): qty 1

## 2016-12-30 MED ORDER — ALLOPURINOL 100 MG PO TABS
100.0000 mg | ORAL_TABLET | Freq: Two times a day (BID) | ORAL | Status: DC
  Administered 2016-12-30 – 2017-01-02 (×6): 100 mg via ORAL
  Filled 2016-12-30 (×10): qty 1

## 2016-12-30 MED ORDER — VITAMIN D 1000 UNITS PO TABS
1000.0000 [IU] | ORAL_TABLET | Freq: Every day | ORAL | Status: DC
  Administered 2016-12-31 – 2017-01-01 (×2): 1000 [IU] via ORAL
  Filled 2016-12-30 (×3): qty 1

## 2016-12-30 NOTE — H&P (Addendum)
[name] is an 76 y.o. female.   Chief Complaint: Back pain HPI: This is 76 year old female who started having back pain on Thursday. She does not recall falling or injuring it in any way. Pain became severe and she saw her primary care doctor the next day. He was prescribed hydrocodone and sent for x-rays. X-ray showed a possible T12 compression fracture. Since taking the pain medications she has not had the pain controlled and she's had nausea and vomiting after taking it. Has not had any by mouth intake hardly at all last 24 hours. She's only admitted for pain control and failure of outpatient therapy.  Past Medical History:  Diagnosis Date  . Anemia   . Arthritis   . CAD (coronary artery disease)    NSTEMI in setting of gallstone pancreatitis 05/2011:  LHC with 3v CAD; CABG was performed 06/14/11: RIMA-LAD, SVG-ramus, SVG-RCA  . Carotid stenosis    Dopplers 0/7/37: LICA 10-62%.  . CHF (congestive heart failure) ([place])   . Chronic bronchitis   . Chronic systolic heart failure ([place])   . Collagen vascular disease ([place])   . Dextrocardia   . DVT of leg (deep venous thrombosis) (HCC) ~2006   left  . Fracture 05/24/2011   right; "did not have surgery"  . GERD (gastroesophageal reflux disease)   . H/O hiatal hernia   . HLD (hyperlipidemia)   . Hypertension   . Ischemic cardiomyopathy    Echocardiogram 06/05/11: EF 40-45%, anteroseptal hypokinesis, apical hypokinesis, mild LAE  . Neuropathy   . Pneumonia 06/13/11   "couple times; long time ago"  . Pulmonary embolus [facility]) March 2013  . Situs inversus with dextrocardia    CT 06/2011: situs inversus totalis.  What would typically be called RCA arose from anterior sinus of Valsalva.  What would typically be called the left main arises from the posterior sinus of Valsalva and gives rise to a large first diagonal branch and diminutive circumflex    Past Surgical History:  Procedure Laterality Date  . ARCH AORTOGRAM Right 06/11/2011    Procedure: ARCH AORTOGRAM;  Surgeon: Burnell Blanks, MD;  Location: [facility] CATH LAB;  Service: Cardiovascular;  Laterality: Right;  . CARDIAC CATHETERIZATION  06/11/11  . CHOLECYSTECTOMY    . CORONARY ARTERY BYPASS GRAFT  06/14/2011   Procedure: CORONARY ARTERY BYPASS GRAFTING (CABG);  Surgeon: Gaye Pollack, MD;  Location: [place];  Service: Open Heart Surgery;  Laterality: N/A;  . ERCP  06/03/2011   Procedure: ENDOSCOPIC RETROGRADE CHOLANGIOPANCREATOGRAPHY (ERCP);  Surgeon: Jeryl Columbia, MD;  Location: [place];  Service: Endoscopy;  Laterality: N/A;  . LEFT HEART CATHETERIZATION WITH CORONARY ANGIOGRAM N/A 06/11/2011   Procedure: LEFT HEART CATHETERIZATION WITH CORONARY ANGIOGRAM;  Surgeon: Burnell Blanks, MD;  Location: [facility] CATH LAB;  Service: Cardiovascular;  Laterality: N/A;  . VAGINAL HYSTERECTOMY  1970's    No family history on file. Social History:  reports that she has never smoked. She has never used smokeless tobacco. She reports that she does not drink alcohol or use drugs.  Allergies:  Allergies  Allergen Reactions  . Penicillins Rash    Has patient had a PCN reaction causing immediate rash, facial/tongue/throat swelling, SOB or lightheadedness with hypotension: Yes Has patient had a PCN reaction causing severe rash involving mucus membranes or skin necrosis: No Has patient had a PCN reaction that required hospitalization: No Has patient had a PCN reaction occurring within the last 10 years: No If all of the above answers  are "NO", then may proceed with Cephalosporin use.   . Shellfish Allergy Anaphylaxis  . Levaquin [Levofloxacin] Hives     (Not in a hospital admission)  Results for orders placed or performed during the hospital encounter of 12/30/16 (from the past 48 hour(s))  Lipase, blood     Status: None   Collection Time: 12/30/16 10:24 AM  Result Value Ref Range   Lipase 28 11 - 51 U/L  Comprehensive metabolic panel     Status: Abnormal   Collection Time:  12/30/16 10:24 AM  Result Value Ref Range   Sodium 137 135 - 145 mmol/L   Potassium 3.2 (L) 3.5 - 5.1 mmol/L   Chloride 95 (L) 101 - 111 mmol/L   CO2 31 22 - 32 mmol/L   Glucose, Bld 144 (H) 65 - 99 mg/dL   BUN 21 (H) 6 - 20 mg/dL   Creatinine, Ser 1.18 (H) 0.44 - 1.00 mg/dL   Calcium 9.1 8.9 - 10.3 mg/dL   Total Protein 7.5 6.5 - 8.1 g/dL   Albumin 3.6 3.5 - 5.0 g/dL   AST 31 15 - 41 U/L   ALT 22 14 - 54 U/L   Alkaline Phosphatase 93 38 - 126 U/L   Total Bilirubin 1.3 (H) 0.3 - 1.2 mg/dL   GFR calc non Af Amer 44 (L) >60 mL/min   GFR calc Af Amer 51 (L) >60 mL/min    Comment: (NOTE) The eGFR has been calculated using the CKD EPI equation. This calculation has not been validated in all clinical situations. eGFR's persistently <60 mL/min signify possible Chronic Kidney Disease.    Anion gap 11 5 - 15  CBC     Status: Abnormal   Collection Time: 12/30/16 10:24 AM  Result Value Ref Range   WBC 12.4 (H) 3.6 - 11.0 K/uL   RBC 3.78 (L) 3.80 - 5.20 MIL/uL   Hemoglobin 12.4 12.0 - 16.0 g/dL   HCT 36.9 35.0 - 47.0 %   MCV 97.6 80.0 - 100.0 fL   MCH 32.7 26.0 - 34.0 pg   MCHC 33.5 32.0 - 36.0 g/dL   RDW 16.8 (H) 11.5 - 14.5 %   Platelets 256 150 - 440 K/uL  Troponin I     Status: None   Collection Time: 12/30/16 10:24 AM  Result Value Ref Range   Troponin I <0.03 <0.03 ng/mL  Protime-INR     Status: Abnormal   Collection Time: 12/30/16 10:24 AM  Result Value Ref Range   Prothrombin Time 39.5 (H) 11.4 - 15.2 seconds   INR 3.94    Dg Lumbar Spine Complete  Result Date: 12/28/2016 CLINICAL DATA:  Back pain.  No injury. EXAM: LUMBAR SPINE - COMPLETE 4+ VIEW COMPARISON:  CT 05/25/2015 . FINDINGS: Situs inversus. Surgical clips right upper quadrant. IVC filter noted in stable position. Stool noted throughout the colon. Mild colonic distention. Diffuse osteopenia degenerative change. Lumbar scoliosis. Patch that mild T12 compression fracture, new from prior CT of 05/25/2015. Aortic  atherosclerotic vascular calcification. Calcified injection granulomas. IMPRESSION: 1. Diffuse osteopenia degenerative change. Mild T12 compression fracture noted. This is new from prior CT of 05/24/2005. 2.Stool noted throughout the colon. Constipation cannot be excluded. Mild colonic distention. 3. Situs inversus. Aortic atherosclerotic vascular disease. IVC filter noted in stable position. Electronically Signed   By: Marcello Moores  Register   On: 12/28/2016 15:38   Ct Renal Stone Study  Addendum Date: 12/30/2016   ADDENDUM REPORT: 12/30/2016 11:03 ADDENDUM: There is a compression deformity  involving the superior endplate of the D32 vertebra as documented on study from 12/28/2016. This is new when compared with 05/25/2015. Electronically Signed   By: Kerby Moors M.D.   On: 12/30/2016 11:03   Result Date: 12/30/2016 CLINICAL DATA:  Back pain and vomiting. EXAM: CT ABDOMEN AND PELVIS WITHOUT CONTRAST TECHNIQUE: Multidetector CT imaging of the abdomen and pelvis was performed following the standard protocol without IV contrast. COMPARISON:  05/25/2015 FINDINGS: Lower chest: No acute abnormality identified. There it is marked bronchiectasis noted within both lung bases as well as multiple tree-in-bud nodules. No pleural effusion identified. Hepatobiliary: Left-sided liver. No focal liver abnormality identified. Previous cholecystectomy. Pneumobilia identified compatible with biliary patency. Pancreas: No focal pancreas abnormality.  No inflammation. Spleen: Spleen is unremarkable. Adrenals/Urinary Tract: No focal adrenal gland abnormality. The kidneys are unremarkable. No hydronephrosis or mass. Bladder diverticula is identified along the left lateral base. Stomach/Bowel: Small hiatal hernia. The stomach and the small bowel loops have a normal course and caliber. There is no evidence for a bowel obstruction. A moderate to large stool burden is identified within the proximal. Vascular/Lymphatic: Aortic  atherosclerosis. No aneurysm. No upper abdominal adenopathy. No pelvic or inguinal adenopathy. Reproductive: Previous hysterectomy.  No adnexal mass. Other: No abdominal wall hernia or abnormality. No abdominopelvic ascites. Musculoskeletal: Degenerative disc disease is identified within the lumbar spine. No aggressive lytic or sclerotic bone lesions IMPRESSION: 1. Changes of sinus inversus. 2. No acute findings within the abdomen or pelvis. 3.  Aortic Atherosclerosis (ICD10-I70.0). 4. Marked bibasilar bronchiectasis 5. Moderate stool burden within the proximal colon. Correlate for any clinical signs or symptoms of constipation. Electronically Signed: By: Kerby Moors M.D. On: 12/30/2016 10:33    Review of Systems  Constitutional: Negative for chills and fever.  HENT: Negative for hearing loss.   Eyes: Negative for blurred vision.  Respiratory: Negative for cough and shortness of breath.   Cardiovascular: Negative for chest pain.  Gastrointestinal: Positive for nausea and vomiting.  Genitourinary: Negative for dysuria.  Musculoskeletal: Positive for back pain.  Skin: Negative for rash.  Neurological: Negative for dizziness.    Blood pressure 126/66, pulse 89, temperature 98.4 F (36.9 C), temperature source Oral, resp. rate (!) 24, height _0  (1.575 m), weight 73.5 kg (162 lb), SpO2 96 %. Physical Exam  Constitutional: She is oriented to person, place, and time. She appears well-developed and well-nourished. No distress.  HENT:  Head: Normocephalic and atraumatic.  Mouth/Throat: Oropharynx is clear and moist. No oropharyngeal exudate.  Eyes: Pupils are equal, round, and reactive to light. EOM are normal. Left eye exhibits no discharge.  Neck: Neck supple. No JVD present. No tracheal deviation present. No thyromegaly present.  Cardiovascular: Normal rate and regular rhythm.   No murmur heard. Respiratory: Effort normal and breath sounds normal. No respiratory distress.  GI: Soft. Bowel  sounds are normal. She exhibits no distension and no mass.  Musculoskeletal: Normal range of motion. She exhibits no edema.  Point tenderness in lower back.  Lymphadenopathy:    She has no cervical adenopathy.  Neurological: She is alert and oriented to person, place, and time.     Assessment/Plan 1. T12 vertebral compression fracture. We'll go ahead and change pain medications give her IV forms. We'll also consult orthopedics who have suggested getting an MRI to further evaluate. They will evaluate her chest her being a possible candidate for kyphoplasty. 2. Nausea and vomiting. Likely adverse reaction to the pain medications. We will give her some IV morphine  and then switch her over to by mouth oxycodone C she will tolerate that better we'll give her antiemetics when necessary. 3. Dehydration. This is from the nausea and vomiting and poor by mouth intake. We'll give her IV fluids. Start her on liquid diet and advance her as tolerated. 4. History of pulmonary embolism. She is on Coumadin. Her INR is supratherapeutic. We'll go ahead and hold her Coumadin for now since possible procedure may be performed. If she comes below therapeutic then will need to bridge her with heparin. I'm also holding her aspirin pending decision on procedure. 5. Diabetes. Hold Glucophage and put her on sliding scale   Please note patient has sinus inversus.  Time spent  Baxter Hire, MD 12/30/2016, 12:04 PM

## 2016-12-30 NOTE — ED Notes (Signed)
Pt in xray

## 2016-12-30 NOTE — Progress Notes (Signed)
Patient is admitted to room 148 from ED. Transferred with assist x1 and walker. On 2L chronic. Oriented to room, call light, TV and bed controls. IV fluids started. Discussed POC and orders. On isolation, hx of MRSA.

## 2016-12-30 NOTE — ED Triage Notes (Signed)
Pt reports severe back pain with no related injury since Thursday PCP provided med reports unable to take med has been vomiting unable to keep down, pt reports she had X-ray done Friday.

## 2016-12-30 NOTE — Consult Note (Signed)
ORTHOPAEDIC CONSULTATION  PATIENT NAME: [name] DOB: [birth year]  MRN: [id number]  REQUESTING PHYSICIAN: Gracelyn Nurse, MD  Chief Complaint: T12 compression fracture  HPI: [name] is a 76 y.o. female who complains of  low back pain that started on Thursday without any obvious history of trauma. Patient states that she woke up with pain at her thoracolumbar junction. She denies any radiation of the pain into her lower extremities or to anterior chest. She denies any history of fall or any strenuous activity. Patient however has been diagnosed with osteoporosis almost 2 years ago based on her bone density test and was placed on alendronate which she is still using once weekly. Patient denies any bowel or bladder symptoms. Patient has been admitted to the hospitalist service for pain control. The orthopedic service is consulted for management of T12 compression fracture.   Past Medical History:  Diagnosis Date  . Anemia   . Arthritis   . CAD (coronary artery disease)    NSTEMI in setting of gallstone pancreatitis 05/2011:  LHC with 3v CAD; CABG was performed 06/14/11: RIMA-LAD, SVG-ramus, SVG-RCA  . Carotid stenosis    Dopplers 06/12/11: LICA 60-79%.  . CHF (congestive heart failure) (HCC)   . Chronic bronchitis   . Chronic systolic heart failure (HCC)   . Collagen vascular disease (HCC)   . Dextrocardia   . DVT of leg (deep venous thrombosis) (HCC) ~2006   left  . Fracture 05/24/2011   right; "did not have surgery"  . GERD (gastroesophageal reflux disease)   . H/O hiatal hernia   . HLD (hyperlipidemia)   . Hypertension   . Ischemic cardiomyopathy    Echocardiogram 06/05/11: EF 40-45%, anteroseptal hypokinesis, apical hypokinesis, mild LAE  . Neuropathy   . Pneumonia 06/13/11   "couple times; long time ago"  . Pulmonary embolus [facility]) March 2013  . Situs inversus with dextrocardia    CT 06/2011: situs inversus totalis.  What would typically be called RCA arose from  anterior sinus of Valsalva.  What would typically be called the left main arises from the posterior sinus of Valsalva and gives rise to a large first diagonal branch and diminutive circumflex   Past Surgical History:  Procedure Laterality Date  . ARCH AORTOGRAM Right 06/11/2011   Procedure: ARCH AORTOGRAM;  Surgeon: Kathleene Hazel, MD;  Location: [facility] CATH LAB;  Service: Cardiovascular;  Laterality: Right;  . CARDIAC CATHETERIZATION  06/11/11  . CHOLECYSTECTOMY    . CORONARY ARTERY BYPASS GRAFT  06/14/2011   Procedure: CORONARY ARTERY BYPASS GRAFTING (CABG);  Surgeon: Alleen Borne, MD;  Location[facility] OR;  Service: Open Heart Surgery;  Laterality: N/A;  . ERCP  06/03/2011   Procedure: ENDOSCOPIC RETROGRADE CHOLANGIOPANCREATOGRAPHY (ERCP);  Surgeon: Petra Kuba, MD;  Location: [facility] OR;  Service: Endoscopy;  Laterality: N/A;  . LEFT HEART CATHETERIZATION WITH CORONARY ANGIOGRAM N/A 06/11/2011   Procedure: LEFT HEART CATHETERIZATION WITH CORONARY ANGIOGRAM;  Surgeon: Kathleene Hazel, MD;  Location: [facility] CATH LAB;  Service: Cardiovascular;  Laterality: N/A;  . VAGINAL HYSTERECTOMY  1970's   Social History   Social History  . Marital status: Widowed    Spouse name: N/A  . Number of children: N/A  . Years of education: N/A   Social History Main Topics  . Smoking status: Never Smoker  . Smokeless tobacco: Never Used  . Alcohol use No  . Drug use: No  . Sexual activity: No   Other Topics Concern  . None  Social History Narrative  . None   History reviewed. No pertinent family history. Allergies  Allergen Reactions  . Penicillins Rash    Has patient had a PCN reaction causing immediate rash, facial/tongue/throat swelling, SOB or lightheadedness with hypotension: Yes Has patient had a PCN reaction causing severe rash involving mucus membranes or skin necrosis: No Has patient had a PCN reaction that required hospitalization: No Has patient had a PCN reaction occurring within the  last 10 years: No If all of the above answers are "NO", then may proceed with Cephalosporin use.   . Shellfish Allergy Anaphylaxis  . Levaquin [Levofloxacin] Hives   Prior to Admission medications   Medication Sig Start Date End Date Taking? Authorizing Provider  alendronate (FOSAMAX) 70 MG tablet Take 70 mg by mouth every Sunday.    Yes [provider]  allopurinol (ZYLOPRIM) 100 MG tablet Take 100 mg by mouth 2 (two) times daily.    Yes [provider]  aspirin EC 81 MG tablet Take 81 mg by mouth daily.   Yes [provider]  cholecalciferol (VITAMIN D) 1000 UNITS tablet Take 1,000 Units by mouth daily.   Yes [provider]  folic acid (FOLVITE) 800 MCG tablet Take 800 mcg by mouth daily.    Yes [provider]  furosemide (LASIX) 40 MG tablet Take 1 tablet (40 mg total) by mouth daily. 08/29/16 12/30/16 Yes Kathleene Hazel, MD  gabapentin (NEURONTIN) 300 MG capsule Take 300 mg by mouth 2 (two) times daily.     Yes [provider]  ipratropium-albuterol (DUONEB) 0.5-2.5 (3) MG/3ML SOLN Take 3 mLs by nebulization every 6 (six) hours as needed. 11/14/16  Yes Merwyn Katos, MD  metFORMIN (GLUCOPHAGE-XR) 500 MG 24 hr tablet Take 500 mg by mouth daily. 10/31/16  Yes [provider]  methotrexate (RHEUMATREX) 2.5 MG tablet Take 20 mg by mouth every Monday.    Yes [provider]  metoprolol tartrate (LOPRESSOR) 25 MG tablet Take 1 tablet (25 mg total) by mouth 2 (two) times daily. 08/31/16 12/30/16 Yes Kathleene Hazel, MD  omeprazole (PRILOSEC) 20 MG capsule Take 20 mg by mouth every morning.     Yes [provider]  simvastatin (ZOCOR) 20 MG tablet Take 20 mg by mouth every evening.    Yes [provider]  warfarin (COUMADIN) 2 MG tablet Take as directed by [facility] Patient taking differently: Take 2-4 mg by mouth See admin instructions. Take 4mg  Monday- Saturday, and on Sunday take 2mg   10/17/16  Yes , MD  Respiratory Therapy Supplies (FLUTTER) DEVI Use 10-15 times daily 09/10/16   Kathleene Hazel, MD   Ct Renal Stone Study  Addendum Date: 12/30/2016   ADDENDUM REPORT: 12/30/2016 11:03 ADDENDUM: There is a compression deformity involving the superior endplate of the T12 vertebra as documented on study from 12/28/2016. This is new when compared with 05/25/2015. Electronically Signed   By: 12/30/2016 M.D.   On: 12/30/2016 11:03   Result Date: 12/30/2016 CLINICAL DATA:  Back pain and vomiting. EXAM: CT ABDOMEN AND PELVIS WITHOUT CONTRAST TECHNIQUE: Multidetector CT imaging of the abdomen and pelvis was performed following the standard protocol without IV contrast. COMPARISON:  05/25/2015 FINDINGS: Lower chest: No acute abnormality identified. There it is marked bronchiectasis noted within both lung bases as well as multiple tree-in-bud nodules. No pleural effusion identified. Hepatobiliary: Left-sided liver. No focal liver abnormality identified. Previous cholecystectomy. Pneumobilia identified compatible with biliary patency. Pancreas: No focal  pancreas abnormality.  No inflammation. Spleen: Spleen is unremarkable. Adrenals/Urinary Tract: No focal adrenal gland abnormality. The kidneys are unremarkable. No hydronephrosis or mass. Bladder diverticula is identified along the left lateral base. Stomach/Bowel: Small hiatal hernia. The stomach and the small bowel loops have a normal course and caliber. There is no evidence for a bowel obstruction. A moderate to large stool burden is identified within the proximal. Vascular/Lymphatic: Aortic atherosclerosis. No aneurysm. No upper abdominal adenopathy. No pelvic or inguinal adenopathy. Reproductive: Previous hysterectomy.  No adnexal mass. Other: No abdominal wall hernia or abnormality. No abdominopelvic ascites. Musculoskeletal: Degenerative disc disease is identified within the lumbar spine. No aggressive lytic or sclerotic  bone lesions IMPRESSION: 1. Changes of sinus inversus. 2. No acute findings within the abdomen or pelvis. 3.  Aortic Atherosclerosis (ICD10-I70.0). 4. Marked bibasilar bronchiectasis 5. Moderate stool burden within the proximal colon. Correlate for any clinical signs or symptoms of constipation. Electronically Signed: By: Signa Kell M.D. On: 12/30/2016 10:33    Positive ROS: All other systems have been reviewed and were otherwise negative with the exception of those mentioned in the HPI and as above.  Physical Exam: General: Well developed, well nourished female seen in no acute distress. HEENT: Atraumatic and normocephalic. Sclera are clear. Extraocular motion is intact. Oropharynx is clear with moist mucosa. Neck: Supple, nontender, good range of motion. No JVD or carotid bruits. Lungs: Clear to auscultation bilaterally. Cardiovascular: Regular rate and rhythm with normal S1 and S2. No murmurs. No gallops or rubs. Pedal pulses are palpable bilaterally. Homans test is negative bilaterally. No significant pretibial or ankle edema. Abdomen: Soft, nontender, and nondistended. Bowel sounds are present. Skin: No lesions in the area of chief complaint Neurologic: Awake, alert, and oriented. Sensory function is grossly intact. Motor strength is felt to be 5 over 5 bilaterally. No clonus or tremor. Good motor coordination. Lymphatic: No axillary or cervical lymphadenopathy Spine: Patient has tenderness to palpation at her thoracolumbar junction.  MUSCULOSKELETAL: Patient has tenderness to palpation at her thoracolumbar junction. She is grossly neurovascularly intact and both lower extremities. There are no upper motor neuron signs. Her motor strength in bilateral quadriceps hamstrings gastrocsoleus and tibialis anterior is bilaterally symmetrical. Ankle and knee range of motion does not elicit any pain. Bilateral upper extremity range of motion is also painless.  Assessment: 76 years old female with  T12 compression fracture of unknown chronicity. This is new since her prior study of x-rays done in 2006.  Plan: 76 years old female with T12 compression fracture of unknown chronicity. I had a detailed discussion with the patient. Patient does have history of osteoporosis and has been using alendronate for the last 2 years. Her fracture is new when compared to the study done in 2006. I would recommend an MRI of her thoracic spine to determine the chronicity of the fracture based on the T2 images. If the fracture is acute patient can be considered for possible kyphoplasty.  James P. Angie Fava M.D.

## 2016-12-30 NOTE — Progress Notes (Signed)

## 2016-12-30 NOTE — ED Provider Notes (Addendum)
[facility] Emergency Department Provider Note  ____________________________________________   I have reviewed the triage vital signs and the nursing notes.   HISTORY  Chief Complaint Back Pain and Emesis    HPI [name] is a 76 y.o. female who presents today complaining of low back pain. Patient's has had it since last Thursday. She states thatthe pain is nonradiating. Lower back. She states she went to see her primary care doctor had x-rays done which showed a compression fracture. They started her on hydrocodone however after she began to take the pain medication she began to vomit and has not been able to hold things down since last night. She therefore complains of vomiting and pain. She is not taking any nausea medication. She believes that she has had vomiting with pain medications before. She denies any dysuria or urinary frequency. She is on chronic anticoagulant administration for saddle embolism. She denies any increased shortness of breath over her baseline.    She denies any numbness or weakness.  Past Medical History:  Diagnosis Date  . Anemia   . Arthritis   . CAD (coronary artery disease)    NSTEMI in setting of gallstone pancreatitis 05/2011:  LHC with 3v CAD; CABG was performed 06/14/11: RIMA-LAD, SVG-ramus, SVG-RCA  . Carotid stenosis    Dopplers 06/12/11: LICA 60-79%.  . CHF (congestive heart failure) (HCC)   . Chronic bronchitis   . Chronic systolic heart failure (HCC)   . Collagen vascular disease (HCC)   . Dextrocardia   . DVT of leg (deep venous thrombosis) (HCC) ~2006   left  . Fracture 05/24/2011   right; "did not have surgery"  . GERD (gastroesophageal reflux disease)   . H/O hiatal hernia   . HLD (hyperlipidemia)   . Hypertension   . Ischemic cardiomyopathy    Echocardiogram 06/05/11: EF 40-45%, anteroseptal hypokinesis, apical hypokinesis, mild LAE  . Neuropathy   . Pneumonia 06/13/11   "couple times; long time ago"   . Pulmonary embolus [facility]) March 2013  . Situs inversus with dextrocardia    CT 06/2011: situs inversus totalis.  What would typically be called RCA arose from anterior sinus of Valsalva.  What would typically be called the left main arises from the posterior sinus of Valsalva and gives rise to a large first diagonal branch and diminutive circumflex    Patient Active Problem List   Diagnosis Date Noted  . CAP (community acquired pneumonia) 05/25/2015  . Encounter for therapeutic drug monitoring 08/05/2013  . 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 CAD (coronary artery disease) 09/11/2011  . 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 Acute respiratory failure with hypoxia (HCC) 08/12/2011  . Saddle embolism of pulmonary artery (HCC) 08/12/2011  . Pericardial effusion 08/12/2011  . Atrial mass 08/12/2011  . Dyspnea 08/07/2011  . CAD 07/10/2011  . Cough 07/10/2011  . DM2 (diabetes mellitus, type 2) (HCC) 07/10/2011  . HLD (hyperlipidemia) 07/10/2011  . Carotid stenosis   . Hypokalemia 06/09/2011  . Hypomagnesemia 06/09/2011  . Dextrocardia 06/09/2011  . Acute myocardial infarction, subendocardial infarction, subsequent episode of care (HCC) 06/08/2011  . Chronic systolic heart failure (HCC) 06/08/2011  . Acute renal failure (HCC) 06/08/2011    Past Surgical History:  Procedure Laterality Date  . ARCH AORTOGRAM Right 06/11/2011   Procedure: ARCH AORTOGRAM;  Surgeon: Kathleene Hazel, MD;  Location: [facility] CATH LAB;  Service: Cardiovascular;  Laterality: Right;  . CARDIAC CATHETERIZATION  06/11/11  . CHOLECYSTECTOMY    .  CORONARY ARTERY BYPASS GRAFT  06/14/2011   Procedure: CORONARY ARTERY BYPASS GRAFTING (CABG);  Surgeon: Alleen Borne, MD;  Location: [facility] OR;  Service: Open Heart Surgery;  Laterality: N/A;  . ERCP  06/03/2011   Procedure: ENDOSCOPIC RETROGRADE CHOLANGIOPANCREATOGRAPHY (ERCP);  Surgeon: Petra Kuba, MD;  Location: [facility] OR;  Service: Endoscopy;  Laterality:  N/A;  . LEFT HEART CATHETERIZATION WITH CORONARY ANGIOGRAM N/A 06/11/2011   Procedure: LEFT HEART CATHETERIZATION WITH CORONARY ANGIOGRAM;  Surgeon: Kathleene Hazel, MD;  Location: [facility] CATH LAB;  Service: Cardiovascular;  Laterality: N/A;  . VAGINAL HYSTERECTOMY  1970's    Prior to Admission medications   Medication Sig Start Date End Date Taking? Authorizing Provider  alendronate (FOSAMAX) 70 MG tablet Take 70 mg by mouth every Sunday.     [provider]  allopurinol (ZYLOPRIM) 100 MG tablet Take 100 mg by mouth 2 (two) times daily.     [provider]  aspirin EC 81 MG tablet Take 81 mg by mouth daily.    [provider]  cholecalciferol (VITAMIN D) 1000 UNITS tablet Take 1,000 Units by mouth daily.    [provider]  folic acid (FOLVITE) 800 MCG tablet Take 400 mcg by mouth daily.    [provider]  furosemide (LASIX) 40 MG tablet Take 1 tablet (40 mg total) by mouth daily. 08/29/16 12/10/16  Kathleene Hazel, MD  gabapentin (NEURONTIN) 300 MG capsule Take 300 mg by mouth 2 (two) times daily.      [provider]  ipratropium-albuterol (DUONEB) 0.5-2.5 (3) MG/3ML SOLN Take 3 mLs by nebulization every 6 (six) hours as needed. 11/14/16   Merwyn Katos, MD  metFORMIN (GLUCOPHAGE-XR) 500 MG 24 hr tablet Take 500 mg by mouth daily. 10/31/16   [provider]  methotrexate (RHEUMATREX) 2.5 MG tablet Take 22.5 mg by mouth every Monday.     [provider]  metoprolol tartrate (LOPRESSOR) 25 MG tablet Take 1 tablet (25 mg total) by mouth 2 (two) times daily. 08/31/16 12/10/16  Kathleene Hazel, MD  omeprazole (PRILOSEC) 20 MG capsule Take 20 mg by mouth every morning.      [provider]  Respiratory Therapy Supplies (FLUTTER) DEVI Use 10-15 times daily 09/10/16   Merwyn Katos, MD  simvastatin (ZOCOR) 20 MG tablet Take 20 mg by mouth every evening.     [provider]  warfarin (COUMADIN) 2  MG tablet Take as directed by [facility] 10/17/16   Kathleene Hazel, MD    Allergies Penicillins; Shellfish allergy; and Levaquin [levofloxacin]  No family history on file.  Social History Social History  Substance Use Topics  . Smoking status: Never Smoker  . Smokeless tobacco: Never Used  . Alcohol use No    Review of Systems Constitutional: No fever/chills Eyes: No visual changes. ENT: No sore throat. No stiff neck no neck pain Cardiovascular: Denies chest pain. Respiratory: Denies shortness of breath. Gastrointestinal:   Positive nonbloody nonbilious vomiting.  No diarrhea.  No constipation. Genitourinary: Negative for dysuria. Musculoskeletal: Negative lower extremity swelling Skin: Negative for rash. Neurological: Negative for severe headaches, focal weakness or numbness.   ____________________________________________   PHYSICAL EXAM:  VITAL SIGNS: ED Triage Vitals  Enc Vitals Group     BP 12/30/16 0928 117/75     Pulse Rate 12/30/16 0928 (!) 101     Resp 12/30/16 0928 (!) 24     Temp 12/30/16 0928 98.4 F (36.9 C)  Temp Source 12/30/16 0928 Oral     SpO2 12/30/16 0927 90 %     Weight 12/30/16 0929 162 lb (73.5 kg)     Height 12/30/16 0929 5\' 2"  (1.575 m)     Head Circumference --      Peak Flow --      Pain Score 12/30/16 0927 10     Pain Loc --      Pain Edu? --      Excl. in GC? --     Constitutional: Alert and oriented. Well appearing and in no acute distress. Eyes: Conjunctivae are normal Head: Atraumatic HEENT: No congestion/rhinnorhea. Mucous membranes are moist.  Oropharynx non-erythematous Neck:   Nontender with no meningismus, no masses, no stridor Cardiovascular: Normal rate, regular rhythm. Grossly normal heart sounds.  Good peripheral circulation. Respiratory: Normal respiratory effort.  No retractions. Lungs CTAB. Abdominal: Soft and nontender. No distention. No guarding no rebound Back:  There is sinus palpation in the  lumbar spine which reproduces her discomfort. She has minimal discomfort in the left flank as well. Musculoskeletal: No lower extremity tenderness, no upper extremity tenderness. No joint effusions, no DVT signs strong distal pulses no edema Neurologic:  Normal speech and language. No gross focal neurologic deficits are appreciated.  Skin:  Skin is warm, dry and intact. No rash noted. Psychiatric: Mood and affect are somewhat anxious. Speech and behavior are normal.  ____________________________________________   LABS (all labs ordered are listed, but only abnormal results are displayed)  Labs Reviewed  LIPASE, BLOOD  COMPREHENSIVE METABOLIC PANEL  CBC  URINALYSIS, COMPLETE (UACMP) WITH MICROSCOPIC  TROPONIN I   ____________________________________________  EKG  I personally interpreted any EKGs ordered by me or triage Sinus rhythm at 91 bpm no acute ST elevation or depression, LAD noted, no acute ischemic changes ____________________________________________  RADIOLOGY  I reviewed any imaging ordered by me or triage that were performed during my shift and, if possible, patient and/or family made aware of any abnormal findings. ____________________________________________   PROCEDURES  Procedure(s) performed: None  Procedures  Critical Care performed: None  ____________________________________________   INITIAL IMPRESSION / ASSESSMENT AND PLAN / ED COURSE  Pertinent labs & imaging results that were available during my care of the patient were reviewed by me and considered in my medical decision making (see chart for details).  Patient here with a known compression fracture who started vomiting after pain medication and has not been able to hold anything down including pain medications since last night. We will give her pain medication, IV fluid, obtain a CT of her abdomen and pelvis to ensure that there is no other pathology present, check urinalysis, Coumadin levels,  basic blood work and reassess. Low suspicion for ACS given low back pain which is quite reproducible with a clear etiology. Similarly, low suspicion for AAA or however I will obtain imaging.    ____________________________________________   FINAL CLINICAL IMPRESSION(S) / ED DIAGNOSES  Final diagnoses:  None      This chart was dictated using voice recognition software.  Despite best efforts to proofread,  errors can occur which can change meaning.      01/01/17, MD 12/30/16 01/01/17    [id number], MD 12/30/16 1145

## 2016-12-31 ENCOUNTER — Inpatient Hospital Stay: Payer: Medicare HMO

## 2016-12-31 LAB — GLUCOSE, CAPILLARY
GLUCOSE-CAPILLARY: 92 mg/dL (ref 65–99)
GLUCOSE-CAPILLARY: 98 mg/dL (ref 65–99)
Glucose-Capillary: 79 mg/dL (ref 65–99)
Glucose-Capillary: 91 mg/dL (ref 65–99)

## 2016-12-31 LAB — BASIC METABOLIC PANEL
Anion gap: 6 (ref 5–15)
BUN: 15 mg/dL (ref 6–20)
CALCIUM: 8.7 mg/dL — AB (ref 8.9–10.3)
CO2: 34 mmol/L — AB (ref 22–32)
Chloride: 103 mmol/L (ref 101–111)
Creatinine, Ser: 1.08 mg/dL — ABNORMAL HIGH (ref 0.44–1.00)
GFR calc Af Amer: 57 mL/min — ABNORMAL LOW (ref >60)
GFR, EST NON AFRICAN AMERICAN: 49 mL/min — AB (ref >60)
GLUCOSE: 86 mg/dL (ref 65–99)
Potassium: 3.3 mmol/L — ABNORMAL LOW (ref 3.5–5.1)
Sodium: 143 mmol/L (ref 135–145)

## 2016-12-31 LAB — CBC
HEMATOCRIT: 36.2 % (ref 35.0–47.0)
Hemoglobin: 12 g/dL (ref 12.0–16.0)
MCH: 32.7 pg (ref 26.0–34.0)
MCHC: 33.1 g/dL (ref 32.0–36.0)
MCV: 98.7 fL (ref 80.0–100.0)
Platelets: 246 10*3/uL (ref 150–440)
RBC: 3.67 MIL/uL — ABNORMAL LOW (ref 3.80–5.20)
RDW: 17.2 % — AB (ref 11.5–14.5)
WBC: 9.3 10*3/uL (ref 3.6–11.0)

## 2016-12-31 LAB — PROTIME-INR
INR: 3.68
PROTHROMBIN TIME: 37.4 s — AB (ref 11.4–15.2)

## 2016-12-31 MED ORDER — LORAZEPAM 0.5 MG PO TABS
0.5000 mg | ORAL_TABLET | Freq: Once | ORAL | Status: AC
  Administered 2016-12-31: 0.5 mg via ORAL
  Filled 2016-12-31: qty 1

## 2016-12-31 MED ORDER — POTASSIUM CHLORIDE CRYS ER 20 MEQ PO TBCR
40.0000 meq | EXTENDED_RELEASE_TABLET | Freq: Once | ORAL | Status: AC
  Administered 2016-12-31: 40 meq via ORAL
  Filled 2016-12-31: qty 2

## 2016-12-31 MED ORDER — POLYETHYLENE GLYCOL 3350 17 G PO PACK
17.0000 g | PACK | Freq: Every day | ORAL | Status: AC
  Administered 2016-12-31: 17 g via ORAL
  Filled 2016-12-31: qty 1

## 2016-12-31 MED ORDER — VITAMIN K1 10 MG/ML IJ SOLN
5.0000 mg | Freq: Once | INTRAVENOUS | Status: AC
  Administered 2016-12-31: 5 mg via INTRAVENOUS
  Filled 2016-12-31: qty 0.5

## 2016-12-31 MED ORDER — KETOROLAC TROMETHAMINE 30 MG/ML IJ SOLN
30.0000 mg | Freq: Once | INTRAMUSCULAR | Status: AC
  Administered 2016-12-31: 30 mg via INTRAVENOUS
  Filled 2016-12-31: qty 1

## 2016-12-31 MED ORDER — SENNOSIDES-DOCUSATE SODIUM 8.6-50 MG PO TABS
1.0000 | ORAL_TABLET | Freq: Two times a day (BID) | ORAL | Status: DC
  Administered 2016-12-31 – 2017-01-02 (×5): 1 via ORAL
  Filled 2016-12-31 (×6): qty 1

## 2016-12-31 NOTE — Progress Notes (Signed)
Reviewed kyphoplasty procedure with patient, T12 shows fracture on MRI. Vitamin K ordered.  Need INR <1.4 for procedure.

## 2016-12-31 NOTE — Progress Notes (Signed)
[facility] - [place] at [facility]   PATIENT NAME: [name]    MR#:  [id number]  DATE OF BIRTH:  1941-04-13  SUBJECTIVE:  CHIEF COMPLAINT:   Chief Complaint  Patient presents with  . Back Pain  . Emesis   Continues to have pain in the upper back. No focal weakness or numbness.  REVIEW OF SYSTEMS:    Review of Systems  Constitutional: Positive for malaise/fatigue. Negative for chills and fever.  HENT: Negative for sore throat.   Eyes: Negative for blurred vision, double vision and pain.  Respiratory: Negative for cough, hemoptysis, shortness of breath and wheezing.   Cardiovascular: Negative for chest pain, palpitations, orthopnea and leg swelling.  Gastrointestinal: Negative for abdominal pain, constipation, diarrhea, heartburn, nausea and vomiting.  Genitourinary: Negative for dysuria and hematuria.  Musculoskeletal: Positive for back pain. Negative for joint pain.  Skin: Negative for rash.  Neurological: Positive for weakness. Negative for sensory change, speech change, focal weakness and headaches.  Endo/Heme/Allergies: Does not bruise/bleed easily.  Psychiatric/Behavioral: Negative for depression. The patient is not nervous/anxious.    DRUG ALLERGIES:   Allergies  Allergen Reactions  . Penicillins Rash    Has patient had a PCN reaction causing immediate rash, facial/tongue/throat swelling, SOB or lightheadedness with hypotension: Yes Has patient had a PCN reaction causing severe rash involving mucus membranes or skin necrosis: No Has patient had a PCN reaction that required hospitalization: No Has patient had a PCN reaction occurring within the last 10 years: No If all of the above answers are "NO", then may proceed with Cephalosporin use.   . Shellfish Allergy Anaphylaxis  . Levaquin [Levofloxacin] Hives    VITALS:  Blood pressure (!) 106/58, pulse 72, temperature 98.2 F (36.8 C), temperature source Oral, resp. rate 18, height 5\' 2"  (1.575  m), weight 74.2 kg (163 lb 8 oz), SpO2 97 %.  PHYSICAL EXAMINATION:   Physical Exam  GENERAL:  76 y.o.-year-old patient lying in the bed with no acute distress. EYES: Pupils equal, round, reactive to light and accommodation. No scleral icterus. Extraocular muscles intact. HEENT: Head atraumatic, normocephalic. Oropharynx and nasopharynx clear. NECK:  Supple, no jugular venous distention. No thyroid enlargement, no tenderness. LUNGS: Normal breath sounds bilaterally, no wheezing, rales, rhonchi. No use of accessory muscles of respiration. CARDIOVASCULAR: S1, S2 normal. No murmurs, rubs, or gallops. ABDOMEN: Soft, nontender, nondistended. Bowel sounds present. No organomegaly or mass. EXTREMITIES: No cyanosis, clubbing or edema b/l. NEUROLOGIC: Cranial nerves II through XII are intact. No focal Motor or sensory deficits b/l. PSYCHIATRIC: The patient is alert and oriented x 3. SKIN: No obvious rash, lesion, or ulcer.  LABORATORY PANEL:   CBC  Recent Labs Lab 12/31/16 0338  WBC 9.3  HGB 12.0  HCT 36.2  PLT 246   ------------------------------------------------------------------------------------------------------------------ Chemistries   Recent Labs Lab 12/30/16 1024 12/31/16 0338  NA 137 143  K 3.2* 3.3*  CL 95* 103  CO2 31 34*  GLUCOSE 144* 86  BUN 21* 15  CREATININE 1.18* 1.08*  CALCIUM 9.1 8.7*  AST 31  --   ALT 22  --   ALKPHOS 93  --   BILITOT 1.3*  --    ------------------------------------------------------------------------------------------------------------------  Cardiac Enzymes  Recent Labs Lab 12/30/16 1024  TROPONINI <0.03   ------------------------------------------------------------------------------------------------------------------  RADIOLOGY:  Mr Thoracic Spine Wo Contrast  Result Date: 12/31/2016 CLINICAL DATA:  Thoracic/lower back pain.  T12 compression fracture. EXAM: MRI THORACIC SPINE WITHOUT CONTRAST TECHNIQUE: Multiplanar,  multisequence MR imaging of the  thoracic spine was performed. No intravenous contrast was administered. COMPARISON:  Chest radiographs 10/26/2016. Lumbar spine radiographs 12/28/2016. CT abdomen and pelvis 12/30/2016. FINDINGS: Alignment:  No significant listhesis. Vertebrae: A T12 superior endplate compression fracture demonstrates approximately 35% height loss, mild-to-moderate marrow edema, and 2-3 mm retropulsion of the superior endplate and is new from 10/26/2016. No posterior element edema or suspicious osseous lesion is identified. There is a small hemangioma in the T10 vertebral body. Mild degenerative endplate changes are present at T1-2. Cord:  Normal signal and morphology. Paraspinal and other soft tissues: Situs inversus as previously described. Disc levels: There is very mild thoracic spondylosis. A tiny right paracentral disc protrusion at T4-5 does not result in stenosis or spinal cord mass effect. IMPRESSION: 1. Acute to subacute T12 compression fracture with mild height loss. 2. Very mild thoracic spondylosis. Electronically Signed   By: Sebastian Ache M.D.   On: 12/31/2016 10:54   Ct Renal Stone Study  Addendum Date: 12/30/2016   ADDENDUM REPORT: 12/30/2016 11:03 ADDENDUM: There is a compression deformity involving the superior endplate of the T12 vertebra as documented on study from 12/28/2016. This is new when compared with 05/25/2015. Electronically Signed   By: Signa Kell M.D.   On: 12/30/2016 11:03   Result Date: 12/30/2016 CLINICAL DATA:  Back pain and vomiting. EXAM: CT ABDOMEN AND PELVIS WITHOUT CONTRAST TECHNIQUE: Multidetector CT imaging of the abdomen and pelvis was performed following the standard protocol without IV contrast. COMPARISON:  05/25/2015 FINDINGS: Lower chest: No acute abnormality identified. There it is marked bronchiectasis noted within both lung bases as well as multiple tree-in-bud nodules. No pleural effusion identified. Hepatobiliary: Left-sided liver. No  focal liver abnormality identified. Previous cholecystectomy. Pneumobilia identified compatible with biliary patency. Pancreas: No focal pancreas abnormality.  No inflammation. Spleen: Spleen is unremarkable. Adrenals/Urinary Tract: No focal adrenal gland abnormality. The kidneys are unremarkable. No hydronephrosis or mass. Bladder diverticula is identified along the left lateral base. Stomach/Bowel: Small hiatal hernia. The stomach and the small bowel loops have a normal course and caliber. There is no evidence for a bowel obstruction. A moderate to large stool burden is identified within the proximal. Vascular/Lymphatic: Aortic atherosclerosis. No aneurysm. No upper abdominal adenopathy. No pelvic or inguinal adenopathy. Reproductive: Previous hysterectomy.  No adnexal mass. Other: No abdominal wall hernia or abnormality. No abdominopelvic ascites. Musculoskeletal: Degenerative disc disease is identified within the lumbar spine. No aggressive lytic or sclerotic bone lesions IMPRESSION: 1. Changes of sinus inversus. 2. No acute findings within the abdomen or pelvis. 3.  Aortic Atherosclerosis (ICD10-I70.0). 4. Marked bibasilar bronchiectasis 5. Moderate stool burden within the proximal colon. Correlate for any clinical signs or symptoms of constipation. Electronically Signed: By: Signa Kell M.D. On: 12/30/2016 10:33   ASSESSMENT AND PLAN:   * T12 vertebral compression fracture. Patient had MRI which confirms. Scheduled for kyphoplasty. INR needs to be less than 1.4. IV and oral pain medications as needed. Ordered one dose of Toradol.  * Vomiting due to medications. Resolved.  * History of pulmonary embolism on Coumadin. Vitamin K given for procedure. We will resume after kyphoplasty.  * Diabetes mellitus. Glucophage held. On sliding scale insulin  * DVT prophylaxis. INR is 3.  All the records are reviewed and case discussed with Care Management/Social Worker Management plans discussed with the  patient, family and they are in agreement.  CODE STATUS: FULL CODE  DVT Prophylaxis: SCDs  TOTAL TIME TAKING CARE OF THIS PATIENT: 35 minutes.   POSSIBLE D/C  IN 1-2 DAYS, DEPENDING ON CLINICAL CONDITION.  Milagros Loll R M.D on 12/31/2016 at 2:23 PM  Between 7am to 6pm - Pager - [phone]  After 6pm go to [email or web address] - password [email or web address]  [facility] [place] [facility]  Office  [phone]  CC: Primary care physician; Elias Else, MD  Note: This dictation was prepared with Dragon dictation along with smaller phrase technology. Any transcriptional errors that result from this process are unintentional.

## 2017-01-01 ENCOUNTER — Encounter: Payer: Self-pay | Admitting: Internal Medicine

## 2017-01-01 LAB — GLUCOSE, CAPILLARY
GLUCOSE-CAPILLARY: 93 mg/dL (ref 65–99)
GLUCOSE-CAPILLARY: 109 mg/dL — AB (ref 65–99)
GLUCOSE-CAPILLARY: 144 mg/dL — AB (ref 65–99)
Glucose-Capillary: 127 mg/dL — ABNORMAL HIGH (ref 65–99)

## 2017-01-01 LAB — MRSA PCR SCREENING: MRSA BY PCR: NEGATIVE

## 2017-01-01 LAB — PROTIME-INR
INR: 1.6
Prothrombin Time: 19.2 seconds — ABNORMAL HIGH (ref 11.4–15.2)

## 2017-01-01 MED ORDER — DEXTROSE 5 % IV SOLN
900.0000 mg | Freq: Once | INTRAVENOUS | Status: AC
  Administered 2017-01-02: 900 mg via INTRAVENOUS
  Filled 2017-01-01: qty 6

## 2017-01-01 NOTE — Progress Notes (Signed)
[facility] - [place] at [facility]   PATIENT NAME: [name]    MR#:  [id number]  DATE OF BIRTH:  [birth year]  SUBJECTIVE:  CHIEF COMPLAINT:   Chief Complaint  Patient presents with  . Back Pain  . Emesis   Still has pain in her back. Got up to bedside commode and presently in severe pain. Received oxycodone. Chronic shortness of breath is the same.  REVIEW OF SYSTEMS:    Review of Systems  Constitutional: Positive for malaise/fatigue. Negative for chills and fever.  HENT: Negative for sore throat.   Eyes: Negative for blurred vision, double vision and pain.  Respiratory: Negative for cough, hemoptysis, shortness of breath and wheezing.   Cardiovascular: Negative for chest pain, palpitations, orthopnea and leg swelling.  Gastrointestinal: Negative for abdominal pain, constipation, diarrhea, heartburn, nausea and vomiting.  Genitourinary: Negative for dysuria and hematuria.  Musculoskeletal: Positive for back pain. Negative for joint pain.  Skin: Negative for rash.  Neurological: Positive for weakness. Negative for sensory change, speech change, focal weakness and headaches.  Endo/Heme/Allergies: Does not bruise/bleed easily.  Psychiatric/Behavioral: Negative for depression. The patient is not nervous/anxious.    DRUG ALLERGIES:   Allergies  Allergen Reactions  . Penicillins Rash    Has patient had a PCN reaction causing immediate rash, facial/tongue/throat swelling, SOB or lightheadedness with hypotension: Yes Has patient had a PCN reaction causing severe rash involving mucus membranes or skin necrosis: No Has patient had a PCN reaction that required hospitalization: No Has patient had a PCN reaction occurring within the last 10 years: No If all of the above answers are "NO", then may proceed with Cephalosporin use.   . Shellfish Allergy Anaphylaxis  . Levaquin [Levofloxacin] Hives    VITALS:  Blood pressure 126/62, pulse 82, temperature 97.6 F  (36.4 C), temperature source Oral, resp. rate 16, height 5\' 2"  (1.575 m), weight 74.2 kg (163 lb 8 oz), SpO2 93 %.  PHYSICAL EXAMINATION:   Physical Exam  GENERAL:  76 y.o.-year-old patient lying in the bed with no acute distress. EYES: Pupils equal, round, reactive to light and accommodation. No scleral icterus. Extraocular muscles intact. HEENT: Head atraumatic, normocephalic. Oropharynx and nasopharynx clear. NECK:  Supple, no jugular venous distention. No thyroid enlargement, no tenderness. LUNGS: Normal breath sounds bilaterally, no wheezing, rales, rhonchi. No use of accessory muscles of respiration. CARDIOVASCULAR: S1, S2 normal. No murmurs, rubs, or gallops. ABDOMEN: Soft, nontender, nondistended. Bowel sounds present. No organomegaly or mass. EXTREMITIES: No cyanosis, clubbing or edema b/l. NEUROLOGIC: Cranial nerves II through XII are intact. No focal Motor or sensory deficits b/l. PSYCHIATRIC: The patient is alert and oriented x 3. SKIN: No obvious rash, lesion, or ulcer.  LABORATORY PANEL:   CBC  Recent Labs Lab 12/31/16 0338  WBC 9.3  HGB 12.0  HCT 36.2  PLT 246   ------------------------------------------------------------------------------------------------------------------ Chemistries   Recent Labs Lab 12/30/16 1024 12/31/16 0338  NA 137 143  K 3.2* 3.3*  CL 95* 103  CO2 31 34*  GLUCOSE 144* 86  BUN 21* 15  CREATININE 1.18* 1.08*  CALCIUM 9.1 8.7*  AST 31  --   ALT 22  --   ALKPHOS 93  --   BILITOT 1.3*  --    ------------------------------------------------------------------------------------------------------------------  Cardiac Enzymes  Recent Labs Lab 12/30/16 1024  TROPONINI <0.03   ------------------------------------------------------------------------------------------------------------------  RADIOLOGY:  Mr Thoracic Spine Wo Contrast  Result Date: 12/31/2016 CLINICAL DATA:  Thoracic/lower back pain.  T12 compression fracture.  EXAM: MRI  THORACIC SPINE WITHOUT CONTRAST TECHNIQUE: Multiplanar, multisequence MR imaging of the thoracic spine was performed. No intravenous contrast was administered. COMPARISON:  Chest radiographs 10/26/2016. Lumbar spine radiographs 12/28/2016. CT abdomen and pelvis 12/30/2016. FINDINGS: Alignment:  No significant listhesis. Vertebrae: A T12 superior endplate compression fracture demonstrates approximately 35% height loss, mild-to-moderate marrow edema, and 2-3 mm retropulsion of the superior endplate and is new from 10/26/2016. No posterior element edema or suspicious osseous lesion is identified. There is a small hemangioma in the T10 vertebral body. Mild degenerative endplate changes are present at T1-2. Cord:  Normal signal and morphology. Paraspinal and other soft tissues: Situs inversus as previously described. Disc levels: There is very mild thoracic spondylosis. A tiny right paracentral disc protrusion at T4-5 does not result in stenosis or spinal cord mass effect. IMPRESSION: 1. Acute to subacute T12 compression fracture with mild height loss. 2. Very mild thoracic spondylosis. Electronically Signed   By: Sebastian Ache M.D.   On: 12/31/2016 10:54   ASSESSMENT AND PLAN:   * T12 vertebral compression fracture. Patient had MRI which confirms. Scheduled for kyphoplasty. INR needs to be less than 1.4. Today INR is 1.6 IV and oral pain medications as needed.  Kyphoplasty scheduled for tomorrow. Discussed with Dr. Rosita Kea of orthopedics. Check INR level in the morning  * Vomiting due to medications. Resolved.  * History of pulmonary embolism on Coumadin. Vitamin K given for procedure. We will resume after kyphoplasty.  * Diabetes mellitus. Glucophage held. On sliding scale insulin.  * DVT prophylaxis. INR is 1.6 SCDs ordered.  All the records are reviewed and case discussed with Care Management/Social Worker Management plans discussed with the patient, family and they are in agreement.  CODE  STATUS: FULL CODE  DVT Prophylaxis: SCDs  TOTAL TIME TAKING CARE OF THIS PATIENT: 35 minutes.   POSSIBLE D/C IN 1-2 DAYS, DEPENDING ON CLINICAL CONDITION.  Milagros Loll R M.D on 01/01/2017 at 10:41 AM  Between 7am to 6pm - Pager - [phone]  After 6pm go to [email or web address] - password [email or web address]  [facility] [place] [facility]  Office  [phone]  CC: Primary care physician; Elias Else, MD  Note: This dictation was prepared with Dragon dictation along with smaller phrase technology. Any transcriptional errors that result from this process are unintentional.

## 2017-01-01 NOTE — Progress Notes (Signed)
Patient continues to have severe pain, plan kyphoplasty tomorrow with INR dropping significantly .

## 2017-01-01 NOTE — Care Management (Signed)
INR level in correct range for surgery. Kyphoplasty planned for tomorrow. PT to follow.

## 2017-01-02 ENCOUNTER — Inpatient Hospital Stay: Payer: Medicare HMO | Admitting: Anesthesiology

## 2017-01-02 ENCOUNTER — Inpatient Hospital Stay: Payer: Medicare HMO

## 2017-01-02 ENCOUNTER — Encounter: Payer: Self-pay | Admitting: *Deleted

## 2017-01-02 ENCOUNTER — Encounter
Admission: EM | Disposition: A | Discharge status: Skilled Nursing Facility | Payer: Self-pay | Source: Home / Self Care | Attending: Internal Medicine

## 2017-01-02 HISTORY — PX: KYPHOPLASTY: SHX5884

## 2017-01-02 LAB — PROTIME-INR
INR: 1.34
PROTHROMBIN TIME: 16.7 s — AB (ref 11.4–15.2)

## 2017-01-02 LAB — GLUCOSE, CAPILLARY
GLUCOSE-CAPILLARY: 113 mg/dL — AB (ref 65–99)
GLUCOSE-CAPILLARY: 127 mg/dL — AB (ref 65–99)
Glucose-Capillary: 113 mg/dL — ABNORMAL HIGH (ref 65–99)
Glucose-Capillary: 112 mg/dL — ABNORMAL HIGH (ref 65–99)

## 2017-01-02 SURGERY — KYPHOPLASTY
Anesthesia: General | Site: Back | Wound class: Clean

## 2017-01-02 MED ORDER — ROCURONIUM BROMIDE 50 MG/5ML IV SOLN
INTRAVENOUS | Status: AC
  Filled 2017-01-02: qty 1

## 2017-01-02 MED ORDER — MIDAZOLAM HCL 2 MG/2ML IJ SOLN
INTRAMUSCULAR | Status: AC
  Filled 2017-01-02: qty 2

## 2017-01-02 MED ORDER — POLYETHYLENE GLYCOL 3350 17 G PO PACK
17.0000 g | PACK | Freq: Two times a day (BID) | ORAL | Status: DC
  Administered 2017-01-02: 17 g via ORAL
  Filled 2017-01-02 (×2): qty 1

## 2017-01-02 MED ORDER — WARFARIN SODIUM 5 MG PO TABS
5.0000 mg | ORAL_TABLET | Freq: Every day | ORAL | Status: DC
  Administered 2017-01-02: 5 mg via ORAL
  Filled 2017-01-02 (×2): qty 1

## 2017-01-02 MED ORDER — METOCLOPRAMIDE HCL 5 MG/ML IJ SOLN: 5.0000 mg | Freq: Three times a day (TID) | INTRAMUSCULAR | Status: DC | PRN

## 2017-01-02 MED ORDER — SODIUM CHLORIDE 0.9 % IV SOLN
INTRAVENOUS | Status: DC
  Administered 2017-01-02: 50 mL/h via INTRAVENOUS

## 2017-01-02 MED ORDER — GLYCOPYRROLATE 0.2 MG/ML IJ SOLN
INTRAMUSCULAR | Status: AC
  Filled 2017-01-02: qty 1

## 2017-01-02 MED ORDER — OXYCODONE HCL 5 MG PO TABS: 5.0000 mg | ORAL_TABLET | Freq: Four times a day (QID) | ORAL | 0 refills | Status: DC | PRN

## 2017-01-02 MED ORDER — PROPOFOL 10 MG/ML IV BOLUS
INTRAVENOUS | Status: DC | PRN
  Administered 2017-01-02: 20 mg via INTRAVENOUS

## 2017-01-02 MED ORDER — BISACODYL 10 MG RE SUPP: 10.0000 mg | Freq: Every day | RECTAL | Status: DC | PRN

## 2017-01-02 MED ORDER — KETOROLAC TROMETHAMINE 30 MG/ML IJ SOLN: 30.0000 mg | Freq: Once | INTRAMUSCULAR | Status: DC

## 2017-01-02 MED ORDER — PROPOFOL 10 MG/ML IV BOLUS
INTRAVENOUS | Status: AC
  Filled 2017-01-02: qty 20

## 2017-01-02 MED ORDER — CLINDAMYCIN PHOSPHATE 900 MG/50ML IV SOLN
INTRAVENOUS | Status: AC
  Filled 2017-01-02: qty 50

## 2017-01-02 MED ORDER — PROPOFOL 500 MG/50ML IV EMUL
INTRAVENOUS | Status: DC | PRN
  Administered 2017-01-02: 50 ug/kg/min via INTRAVENOUS

## 2017-01-02 MED ORDER — MIDAZOLAM HCL 5 MG/5ML IJ SOLN
INTRAMUSCULAR | Status: DC | PRN
  Administered 2017-01-02: 0.5 mg via INTRAVENOUS

## 2017-01-02 MED ORDER — LIDOCAINE HCL (PF) 1 % IJ SOLN
INTRAMUSCULAR | Status: AC
  Filled 2017-01-02: qty 60

## 2017-01-02 MED ORDER — IOPAMIDOL (ISOVUE-M 200) INJECTION 41%
INTRAMUSCULAR | Status: AC
  Filled 2017-01-02: qty 20

## 2017-01-02 MED ORDER — FENTANYL CITRATE (PF) 100 MCG/2ML IJ SOLN
INTRAMUSCULAR | Status: AC
  Filled 2017-01-02: qty 2

## 2017-01-02 MED ORDER — MAGNESIUM HYDROXIDE 400 MG/5ML PO SUSP: 30.0000 mL | Freq: Every day | ORAL | Status: DC | PRN

## 2017-01-02 MED ORDER — OXYCODONE HCL 5 MG PO TABS: 5.0000 mg | ORAL_TABLET | Freq: Once | ORAL | Status: DC | PRN

## 2017-01-02 MED ORDER — METHOCARBAMOL 1000 MG/10ML IJ SOLN
500.0000 mg | Freq: Four times a day (QID) | INTRAVENOUS | Status: DC | PRN
  Filled 2017-01-02: qty 5

## 2017-01-02 MED ORDER — DOCUSATE SODIUM 100 MG PO CAPS
100.0000 mg | ORAL_CAPSULE | Freq: Two times a day (BID) | ORAL | Status: DC
  Administered 2017-01-02: 100 mg via ORAL
  Filled 2017-01-02 (×4): qty 1

## 2017-01-02 MED ORDER — MEPERIDINE HCL 50 MG/ML IJ SOLN: 6.2500 mg | INTRAMUSCULAR | Status: DC | PRN

## 2017-01-02 MED ORDER — PROMETHAZINE HCL 25 MG/ML IJ SOLN: 6.2500 mg | INTRAMUSCULAR | Status: DC | PRN

## 2017-01-02 MED ORDER — BUPIVACAINE-EPINEPHRINE (PF) 0.5% -1:200000 IJ SOLN
INTRAMUSCULAR | Status: AC
  Filled 2017-01-02: qty 30

## 2017-01-02 MED ORDER — FENTANYL CITRATE (PF) 100 MCG/2ML IJ SOLN
INTRAMUSCULAR | Status: DC | PRN
  Administered 2017-01-02: 25 ug via INTRAVENOUS
  Administered 2017-01-02: 50 ug via INTRAVENOUS
  Administered 2017-01-02: 25 ug via INTRAVENOUS

## 2017-01-02 MED ORDER — SEVOFLURANE IN SOLN
RESPIRATORY_TRACT | Status: AC
  Filled 2017-01-02: qty 250

## 2017-01-02 MED ORDER — BUPIVACAINE-EPINEPHRINE (PF) 0.5% -1:200000 IJ SOLN
INTRAMUSCULAR | Status: DC | PRN
  Administered 2017-01-02: 15 mL via PERINEURAL

## 2017-01-02 MED ORDER — LIDOCAINE HCL (PF) 2 % IJ SOLN
INTRAMUSCULAR | Status: AC
  Filled 2017-01-02: qty 2

## 2017-01-02 MED ORDER — METOCLOPRAMIDE HCL 10 MG PO TABS: 5.0000 mg | ORAL_TABLET | Freq: Three times a day (TID) | ORAL | Status: DC | PRN

## 2017-01-02 MED ORDER — FENTANYL CITRATE (PF) 100 MCG/2ML IJ SOLN: 25.0000 ug | INTRAMUSCULAR | Status: DC | PRN

## 2017-01-02 MED ORDER — METHOCARBAMOL 500 MG PO TABS
500.0000 mg | ORAL_TABLET | Freq: Four times a day (QID) | ORAL | Status: DC | PRN
  Administered 2017-01-03: 500 mg via ORAL
  Filled 2017-01-02 (×2): qty 1

## 2017-01-02 MED ORDER — SODIUM CHLORIDE 0.9 % IV SOLN
INTRAVENOUS | Status: DC
  Administered 2017-01-02: 14:00:00 via INTRAVENOUS

## 2017-01-02 MED ORDER — OXYCODONE HCL 5 MG/5ML PO SOLN: 5.0000 mg | Freq: Once | ORAL | Status: DC | PRN

## 2017-01-02 MED ORDER — LIDOCAINE HCL 1 % IJ SOLN
INTRAMUSCULAR | Status: DC | PRN
  Administered 2017-01-02: 10 mL
  Administered 2017-01-02: 15 mL

## 2017-01-02 MED ORDER — WARFARIN - PHYSICIAN DOSING INPATIENT: Freq: Every day | Status: DC

## 2017-01-02 SURGICAL SUPPLY — 18 items
ADH SKN CLS APL DERMABOND .7 (GAUZE/BANDAGES/DRESSINGS) ×1
CEMENT KYPHON CX01A KIT/MIXER (Cement) ×3 IMPLANT
CHLORAPREP W/TINT 26ML (MISCELLANEOUS) ×3 IMPLANT
DERMABOND ADVANCED (GAUZE/BANDAGES/DRESSINGS) ×2
DERMABOND ADVANCED .7 DNX12 (GAUZE/BANDAGES/DRESSINGS) ×1 IMPLANT
DEVICE BIOPSY BONE KYPHX (INSTRUMENTS) ×5 IMPLANT
DRAPE C-ARM XRAY 36X54 (DRAPES) ×3 IMPLANT
DURAPREP 26ML APPLICATOR (WOUND CARE) ×1 IMPLANT
GLOVE SURG SYN 9.0  PF PI (GLOVE) ×2
GLOVE SURG SYN 9.0 PF PI (GLOVE) ×1 IMPLANT
GOWN SRG 2XL LVL 4 RGLN SLV (GOWNS) ×1 IMPLANT
GOWN STRL NON-REIN 2XL LVL4 (GOWNS) ×3
GOWN STRL REUS W/ TWL LRG LVL3 (GOWN DISPOSABLE) ×1 IMPLANT
GOWN STRL REUS W/TWL LRG LVL3 (GOWN DISPOSABLE) ×3
PACK KYPHOPLASTY (MISCELLANEOUS) ×3 IMPLANT
STRAP SAFETY BODY (MISCELLANEOUS) ×3 IMPLANT
TRAY KYPHOPAK 15/3 EXPRESS 1ST (MISCELLANEOUS) ×3 IMPLANT
TRAY KYPHOPAK 20/3 EXPRESS 1ST (MISCELLANEOUS) IMPLANT

## 2017-01-02 NOTE — OR Nursing (Signed)
Patient has pads on both elbows and lower back. Bruising noted on right arm from prior iv attempts when she came to the hospital. States she has not used her compression vest of flutter device since being in hospital.  She does have a chronic congested sounding cough but is unable to get phlegm out. Overall, she states she is in a lot of pain and just exhausted from it.

## 2017-01-02 NOTE — Anesthesia Post-op Follow-up Note (Cosign Needed)
Anesthesia QCDR form completed.        

## 2017-01-02 NOTE — Op Note (Signed)
12/30/2016 - 01/02/2017  12:26 PM  PATIENT:  [name]  76 y.o. female  PRE-OPERATIVE DIAGNOSIS:  T-12 compression fracture  POST-OPERATIVE DIAGNOSIS:  T-12 compression fracture  PROCEDURE:  T12 kyphoplasty  SURGEON: Laurene Footman, MD  ASSISTANTS: None  ANESTHESIA:   local and MAC  EBL:  Total I/O In: 100 [I.V.:100] Out: -   BLOOD ADMINISTERED:none  DRAINS: none   LOCAL MEDICATIONS USED:  MARCAINE    and XYLOCAINE   SPECIMEN:  No Specimen  DISPOSITION OF SPECIMEN:  N/A  COUNTS:  YES  TOURNIQUET:  * No tourniquets in log *  IMPLANTS: Bone cement  DICTATION: .Dragon Dictation patient brought the operating room and after adequate sedation was given the patient was placed prone and C-arm brought in and with excellent visualization of T12. After patient identification and timeout procedure completed 10 cc 1% Xylocaine was infiltrated on the right side. Next the back was prepped and draped in sterile manner and repeat timeout procedure carried out. Spinal needle was used to get local anesthetic down to the pedicle on the right side with 15 cc of half percent Sensorcaine with epinephrine and 15 cc of Xylocaine on each side. A small incision was made on the right side and a trocar advanced in an extrapedicular fashion, biopsyobtained. The drilling was carried out followed by inflation of a balloon and essentially across the midline so second insertion site was not needed with about 4.5cc inflation there is also mildcorrection of the kyphotic deformity. The cement was then mixed and inserted when it was the appropriate consistency with 5 cc of bone cement filling the vertebral body getting very good interdigitation and coverage from superior to inferior medial right to left sides. When the cement was set the trochar removed and permanent C-arm views obtained. Dermabond were used to close the skin followed by a Band-Aid  PLAN OF CARE: Continue as observation  PATIENT  DISPOSITION:  PACU - hemodynamically stable.

## 2017-01-02 NOTE — Progress Notes (Signed)
PT Cancellation Note  Patient Details Name: [name] MRN: [id number] DOB: 1940-11-22   Cancelled Treatment:    Reason Eval/Treat Not Completed: Patient at procedure or test/unavailable (Consult received and chart reviewed.  Patient currently off unit for kypohplasty; will require new orders for initiation of PT services post-op.  Initial consult complete.  Please re-consult as medically appropriate.)   Taylorann Tkach H. Manson Passey, PT, DPT, NCS 01/02/17, 10:52 AM [phone]

## 2017-01-02 NOTE — Transfer of Care (Signed)
Immediate Anesthesia Transfer of Care Note  Patient: [name]  Procedure(s) Performed: Procedure(s): KYPHOPLASTY L2 (N/A)  Patient Location: PACU  Anesthesia Type:General  Level of Consciousness: awake, oriented and patient cooperative  Airway & Oxygen Therapy: Patient Spontanous Breathing and Patient connected to nasal cannula oxygen  Post-op Assessment: Report given to RN and Post -op Vital signs reviewed and stable  Post vital signs: Reviewed and stable  Last Vitals:  Vitals:   01/02/17 0755 01/02/17 1059  BP: 121/64 (!) 144/75  Pulse: 86 85  Resp: 18 16  Temp: 36.7 C     Last Pain:  Vitals:   01/02/17 1059  TempSrc:   PainSc: 9       Patients Stated Pain Goal: 1 (01/02/17 1059)  Complications: No apparent anesthesia complications

## 2017-01-02 NOTE — Progress Notes (Signed)
PT Cancellation Note  Patient Details Name: [name] MRN: [id number] DOB: 07-May-1941   Cancelled Treatment:    Reason Eval/Treat Not Completed: Pain limiting ability to participate Attempted to see pt POD0 T12 kypho, pt in too much pain to participate.  Also had some confusion/lethargy.  Will try back tomorrow.  Malachi Pro, DPT 01/02/2017, 5:20 PM

## 2017-01-02 NOTE — Anesthesia Postprocedure Evaluation (Signed)
Anesthesia Post Note  Patient: [name]  Procedure(s) Performed: Procedure(s) (LRB): KYPHOPLASTY L2 (N/A)  Patient location during evaluation: PACU Anesthesia Type: General Level of consciousness: awake and alert and oriented Pain management: pain level controlled Vital Signs Assessment: post-procedure vital signs reviewed and stable Respiratory status: spontaneous breathing, nonlabored ventilation and respiratory function stable Cardiovascular status: blood pressure returned to baseline and stable Postop Assessment: no signs of nausea or vomiting Anesthetic complications: no     Last Vitals:  Vitals:   01/02/17 1229 01/02/17 1244  BP: 124/83 123/67  Pulse: 84 85  Resp: 17 18  Temp:      Last Pain:  Vitals:   01/02/17 1244  TempSrc:   PainSc: 0-No pain                 Leanda Padmore

## 2017-01-02 NOTE — Discharge Instructions (Signed)
Acidity as tolerated.  Resume diet as before  Continue oxygen

## 2017-01-02 NOTE — Anesthesia Preprocedure Evaluation (Signed)
Anesthesia Evaluation  Patient identified by MRN, date of birth, ID band Patient awake    Reviewed: Allergy & Precautions, NPO status , Patient's Chart, lab work & pertinent test results  History of Anesthesia Complications Negative for: history of anesthetic complications  Airway Mallampati: II  TM Distance: >3 FB Neck ROM: Full    Dental  (+) Upper Dentures, Lower Dentures   Pulmonary shortness of breath, neg sleep apnea, COPD,  oxygen dependent,    breath sounds clear to auscultation- rhonchi (-) wheezing      Cardiovascular hypertension, Pt. on medications + CAD, + Past MI, + CABG (2013) and +CHF (preserved EF)   Rhythm:Regular Rate:Normal - Systolic murmurs and - Diastolic murmurs Echo 08/28/16: - Left ventricle: The cavity size was normal. Wall thickness was   normal. Systolic function was normal. The estimated ejection   fraction was in the range of 55% to 60%. Although no diagnostic   regional wall motion abnormality was identified, this possibility   cannot be completely excluded on the basis of this study. Doppler   parameters are consistent with abnormal left ventricular   relaxation (grade 1 diastolic dysfunction). - Aortic valve: There was no stenosis. - Mitral valve: Mildly calcified annulus. There was no significant   regurgitation. - Right ventricle: Poorly visualized. Probably normal size and   systolic function. - Pulmonary arteries: No complete TR doppler jet so unable to   estimate PA systolic pressure. - Systemic veins: IVC not visualized.   Neuro/Psych negative neurological ROS  negative psych ROS   GI/Hepatic Neg liver ROS, hiatal hernia, GERD  ,  Endo/Other  diabetes, Oral Hypoglycemic Agents  Renal/GU Renal InsufficiencyRenal disease     Musculoskeletal  (+) Arthritis ,   Abdominal (+) - obese,   Peds  Hematology  (+) anemia ,   Anesthesia Other Findings Past Medical History: No  date: Anemia No date: Arthritis No date: CAD (coronary artery disease)     Comment:  NSTEMI in setting of gallstone pancreatitis 05/2011:                LHC with 3v CAD; CABG was performed 06/14/11: RIMA-LAD,               SVG-ramus, SVG-RCA No date: Carotid stenosis     Comment:  Dopplers 06/12/11: LICA 60-79%. No date: CHF (congestive heart failure) (HCC) No date: Chronic bronchitis No date: Chronic systolic heart failure (HCC) No date: Collagen vascular disease (HCC) No date: Dextrocardia ~2006: DVT of leg (deep venous thrombosis) (HCC)     Comment:  left 05/24/2011: Fracture     Comment:  right; "did not have surgery" No date: GERD (gastroesophageal reflux disease) No date: H/O hiatal hernia No date: HLD (hyperlipidemia) No date: Hypertension No date: Ischemic cardiomyopathy     Comment:  Echocardiogram 06/05/11: EF 40-45%, anteroseptal               hypokinesis, apical hypokinesis, mild LAE No date: Neuropathy 06/13/11: Pneumonia     Comment:  "couple times; long time ago" March 2013: Pulmonary embolus (HCC) No date: Rheumatoid arthritis (HCC) No date: Situs inversus with dextrocardia     Comment:  CT 06/2011: situs inversus totalis.  What would typically              be called RCA arose from anterior sinus of Valsalva.                What would typically be called the left main arises from  the posterior sinus of Valsalva and gives rise to a large              first diagonal branch and diminutive circumflex   Reproductive/Obstetrics                             Anesthesia Physical Anesthesia Plan  ASA: IV  Anesthesia Plan: General   Post-op Pain Management:    Induction: Intravenous  PONV Risk Score and Plan: 2 and Ondansetron and Propofol infusion  Airway Management Planned: Natural Airway  Additional Equipment:   Intra-op Plan:   Post-operative Plan:   Informed Consent: I have reviewed the patients History and Physical,  chart, labs and discussed the procedure including the risks, benefits and alternatives for the proposed anesthesia with the patient or authorized representative who has indicated his/her understanding and acceptance.   Dental advisory given  Plan Discussed with: CRNA and Anesthesiologist  Anesthesia Plan Comments:         Anesthesia Quick Evaluation

## 2017-01-02 NOTE — Progress Notes (Signed)
INR 1.3, plan T12 kyphoplasty later this morning. Site marked

## 2017-01-03 DIAGNOSIS — E119 Type 2 diabetes mellitus without complications: Secondary | ICD-10-CM | POA: Diagnosis not present

## 2017-01-03 DIAGNOSIS — I2782 Chronic pulmonary embolism: Secondary | ICD-10-CM | POA: Diagnosis not present

## 2017-01-03 DIAGNOSIS — M0579 Rheumatoid arthritis with rheumatoid factor of multiple sites without organ or systems involvement: Secondary | ICD-10-CM | POA: Diagnosis not present

## 2017-01-03 DIAGNOSIS — S32000A Wedge compression fracture of unspecified lumbar vertebra, initial encounter for closed fracture: Secondary | ICD-10-CM | POA: Diagnosis not present

## 2017-01-03 DIAGNOSIS — E1129 Type 2 diabetes mellitus with other diabetic kidney complication: Secondary | ICD-10-CM | POA: Diagnosis not present

## 2017-01-03 DIAGNOSIS — R112 Nausea with vomiting, unspecified: Secondary | ICD-10-CM | POA: Diagnosis not present

## 2017-01-03 DIAGNOSIS — J449 Chronic obstructive pulmonary disease, unspecified: Secondary | ICD-10-CM | POA: Diagnosis not present

## 2017-01-03 DIAGNOSIS — Z7984 Long term (current) use of oral hypoglycemic drugs: Secondary | ICD-10-CM | POA: Diagnosis not present

## 2017-01-03 DIAGNOSIS — J984 Other disorders of lung: Secondary | ICD-10-CM | POA: Diagnosis not present

## 2017-01-03 DIAGNOSIS — I6529 Occlusion and stenosis of unspecified carotid artery: Secondary | ICD-10-CM | POA: Diagnosis not present

## 2017-01-03 DIAGNOSIS — Z79899 Other long term (current) drug therapy: Secondary | ICD-10-CM | POA: Diagnosis not present

## 2017-01-03 DIAGNOSIS — Z7401 Bed confinement status: Secondary | ICD-10-CM | POA: Diagnosis not present

## 2017-01-03 DIAGNOSIS — J9611 Chronic respiratory failure with hypoxia: Secondary | ICD-10-CM | POA: Diagnosis not present

## 2017-01-03 DIAGNOSIS — Z9889 Other specified postprocedural states: Secondary | ICD-10-CM | POA: Diagnosis not present

## 2017-01-03 DIAGNOSIS — N390 Urinary tract infection, site not specified: Secondary | ICD-10-CM | POA: Diagnosis not present

## 2017-01-03 DIAGNOSIS — D649 Anemia, unspecified: Secondary | ICD-10-CM | POA: Diagnosis not present

## 2017-01-03 DIAGNOSIS — I2510 Atherosclerotic heart disease of native coronary artery without angina pectoris: Secondary | ICD-10-CM | POA: Diagnosis not present

## 2017-01-03 DIAGNOSIS — M8000XD Age-related osteoporosis with current pathological fracture, unspecified site, subsequent encounter for fracture with routine healing: Secondary | ICD-10-CM | POA: Diagnosis not present

## 2017-01-03 DIAGNOSIS — I509 Heart failure, unspecified: Secondary | ICD-10-CM | POA: Diagnosis not present

## 2017-01-03 DIAGNOSIS — R319 Hematuria, unspecified: Secondary | ICD-10-CM | POA: Diagnosis not present

## 2017-01-03 DIAGNOSIS — S22088D Other fracture of T11-T12 vertebra, subsequent encounter for fracture with routine healing: Secondary | ICD-10-CM | POA: Diagnosis not present

## 2017-01-03 DIAGNOSIS — J479 Bronchiectasis, uncomplicated: Secondary | ICD-10-CM | POA: Diagnosis not present

## 2017-01-03 DIAGNOSIS — S329XXA Fracture of unspecified parts of lumbosacral spine and pelvis, initial encounter for closed fracture: Secondary | ICD-10-CM | POA: Diagnosis not present

## 2017-01-03 DIAGNOSIS — I252 Old myocardial infarction: Secondary | ICD-10-CM | POA: Diagnosis not present

## 2017-01-03 DIAGNOSIS — R062 Wheezing: Secondary | ICD-10-CM | POA: Diagnosis not present

## 2017-01-03 DIAGNOSIS — Z951 Presence of aortocoronary bypass graft: Secondary | ICD-10-CM | POA: Diagnosis not present

## 2017-01-03 DIAGNOSIS — E860 Dehydration: Secondary | ICD-10-CM | POA: Diagnosis not present

## 2017-01-03 LAB — PROTIME-INR
INR: 1.43
PROTHROMBIN TIME: 17.6 s — AB (ref 11.4–15.2)

## 2017-01-03 LAB — CBC
HEMATOCRIT: 33.5 % — AB (ref 35.0–47.0)
HEMOGLOBIN: 11.1 g/dL — AB (ref 12.0–16.0)
MCH: 32.9 pg (ref 26.0–34.0)
MCHC: 33.2 g/dL (ref 32.0–36.0)
MCV: 98.9 fL (ref 80.0–100.0)
Platelets: 245 10*3/uL (ref 150–440)
RBC: 3.39 MIL/uL — ABNORMAL LOW (ref 3.80–5.20)
RDW: 17.1 % — AB (ref 11.5–14.5)
WBC: 7.9 10*3/uL (ref 3.6–11.0)

## 2017-01-03 LAB — GLUCOSE, CAPILLARY
GLUCOSE-CAPILLARY: 124 mg/dL — AB (ref 65–99)
GLUCOSE-CAPILLARY: 165 mg/dL — AB (ref 65–99)
GLUCOSE-CAPILLARY: 82 mg/dL (ref 65–99)

## 2017-01-03 MED ORDER — WARFARIN SODIUM 4 MG PO TABS
4.0000 mg | ORAL_TABLET | ORAL | Status: DC
  Filled 2017-01-03: qty 1

## 2017-01-03 MED ORDER — OXYCODONE HCL 5 MG PO TABS: 5.0000 mg | ORAL_TABLET | ORAL | Status: DC | PRN

## 2017-01-03 MED ORDER — METHOCARBAMOL 500 MG PO TABS: 500.0000 mg | ORAL_TABLET | Freq: Four times a day (QID) | ORAL | 0 refills | Status: DC | PRN

## 2017-01-03 MED ORDER — POLYETHYLENE GLYCOL 3350 17 G PO PACK: 17.0000 g | PACK | Freq: Two times a day (BID) | ORAL | Status: DC

## 2017-01-03 MED ORDER — WARFARIN - PHARMACIST DOSING INPATIENT: Freq: Every day | Status: DC

## 2017-01-03 MED ORDER — SCOPOLAMINE 1 MG/3DAYS TD PT72
1.0000 | MEDICATED_PATCH | TRANSDERMAL | Status: DC
  Administered 2017-01-03: 1.5 mg via TRANSDERMAL
  Filled 2017-01-03: qty 1

## 2017-01-03 MED ORDER — WARFARIN SODIUM 2 MG PO TABS: 2.0000 mg | ORAL_TABLET | ORAL | Status: DC

## 2017-01-03 MED ORDER — ENOXAPARIN SODIUM 40 MG/0.4ML ~~LOC~~ SOLN: 40.0000 mg | SUBCUTANEOUS | Status: DC

## 2017-01-03 NOTE — Progress Notes (Signed)
ANTICOAGULATION CONSULT NOTE - Initial Consult  Pharmacy Consult for Warfarin Indication: History of DVT and PE  Allergies  Allergen Reactions  . Penicillins Rash    Has patient had a PCN reaction causing immediate rash, facial/tongue/throat swelling, SOB or lightheadedness with hypotension: Yes Has patient had a PCN reaction causing severe rash involving mucus membranes or skin necrosis: No Has patient had a PCN reaction that required hospitalization: No Has patient had a PCN reaction occurring within the last 10 years: No If all of the above answers are "NO", then may proceed with Cephalosporin use.   . Shellfish Allergy Anaphylaxis  . Levaquin [Levofloxacin] Hives    Patient Measurements: Height: 5\' 2"  (157.5 cm) Weight: 163 lb (73.9 kg) IBW/kg (Calculated) : 50.1   Vital Signs: Temp: 98.4 F (36.9 C) (08/02 1320) Temp Source: Oral (08/02 1320) BP: 129/66 (08/02 1320) Pulse Rate: 92 (08/02 1320)  Labs:  Recent Labs  01/01/17 0324 01/02/17 0358 01/03/17 0419  HGB  --   --  11.1*  HCT  --   --  33.5*  PLT  --   --  245  LABPROT 19.2* 16.7* 17.6*  INR 1.60 1.34 1.43    Estimated Creatinine Clearance: 43 mL/min (A) (by C-G formula based on SCr of 1.08 mg/dL (H)).   Medical History: Past Medical History:  Diagnosis Date  . Anemia   . Arthritis   . CAD (coronary artery disease)    NSTEMI in setting of gallstone pancreatitis 05/2011:  LHC with 3v CAD; CABG was performed 06/14/11: RIMA-LAD, SVG-ramus, SVG-RCA  . Carotid stenosis    Dopplers 06/12/11: LICA 60-79%.  . CHF (congestive heart failure) (HCC)   . Chronic bronchitis   . Chronic systolic heart failure (HCC)   . Collagen vascular disease (HCC)   . Dextrocardia   . DVT of leg (deep venous thrombosis) (HCC) ~2006   left  . Fracture 05/24/2011   right; "did not have surgery"  . GERD (gastroesophageal reflux disease)   . H/O hiatal hernia   . HLD (hyperlipidemia)   . Hypertension   . Ischemic  cardiomyopathy    Echocardiogram 06/05/11: EF 40-45%, anteroseptal hypokinesis, apical hypokinesis, mild LAE  . Neuropathy   . Pneumonia 06/13/11   "couple times; long time ago"  . Pulmonary embolus [facility]) March 2013  . Rheumatoid arthritis (HCC)   . Situs inversus with dextrocardia    CT 06/2011: situs inversus totalis.  What would typically be called RCA arose from anterior sinus of Valsalva.  What would typically be called the left main arises from the posterior sinus of Valsalva and gives rise to a large first diagonal branch and diminutive circumflex    Medications:  Previous home warfarin schedule:  4mg  Monday-Saturday 2mg  Sunday  Assessment: Pharmacy was consulted to dose warfarin for patient taking warfarin for prior DVT and PE. Patient INR was supratherapeutic at 3.94 when admitted on 7/30. Patient underwent kyphoplasty so warfarin was held until post-op. Patient is now subtherapeutic with INR of 1.43 on 8/2. Will be adjusting warfarin dosing to reach therapeutic INR of 2-3.   Dosing History:  Date INR Dose 7/29 3.94 HELD 7/30 3.68 HELD 7/31 1.6 HELD 8/1 1.34 5mg  8/2 1.43 4mg   Goal of Therapy:  INR 2-3 Monitor platelets by anticoagulation protocol: Yes   Plan:  Warfarin 4mg  dose ordered this evening.  Spoke with MD regarding bridging patient and they decided to start enoxaparin 40mg  daily for DVT prophylaxis while INR subtherapeutic.  Since INR was  supratherapeutic on admission, recommend discharging on following warfarin schedule: 4mg  Monday, Tuesday, Thursday, Saturday 2mg  Wednesday, Friday, and Sunday   Friday, PharmD Pharmacy Resident 01/03/2017,2:30 PM

## 2017-01-03 NOTE — Progress Notes (Signed)
Report called to Rosey Bath, Charity fundraiser at [employer]. AVS included in discharge folder. IV removed without complications. Family at bedside awaiting EMS.

## 2017-01-03 NOTE — Clinical Social Work Placement (Signed)
   CLINICAL SOCIAL WORK PLACEMENT  NOTE  Date:  01/03/2017  Patient Details  Name: [name] MRN: [id number] Date of Birth: March 12, 1941  Clinical Social Work is seeking post-discharge placement for this patient at the Skilled  Nursing Facility level of care (*CSW will initial, date and re-position this form in  chart as items are completed):  Yes   Patient/family provided with [facility] Clinical Social Work Department's list of facilities offering this level of care within the geographic area requested by the patient (or if unable, by the patient's family).  Yes   Patient/family informed of their freedom to choose among providers that offer the needed level of care, that participate in Medicare, Medicaid or managed care program needed by the patient, have an available bed and are willing to accept the patient.  Yes   Patient/family informed of [facility]'s ownership interest in [facility] and [facility], as well as of the fact that they are under no obligation to receive care at these facilities.  PASRR submitted to EDS on       PASRR number received on       Existing PASRR number confirmed on 01/03/17     FL2 transmitted to all facilities in geographic area requested by pt/family on 01/03/17     FL2 transmitted to all facilities within larger geographic area on       Patient informed that his/her managed care company has contracts with or will negotiate with certain facilities, including the following:        Yes   Patient/family informed of bed offers received.  Patient chooses bed at  [facility] )     Physician recommends and patient chooses bed at      Patient to be transferred to  [facility] ) on 01/03/17.  Patient to be transferred to facility by  [facility] )     Patient family notified on 01/03/17 of transfer.  Name of family member notified:   (Patient's daughter [name] is aware of D/C today. )     PHYSICIAN        Additional Comment:    _______________________________________________ Mischele Detter, Darleen Crocker, LCSW 01/03/2017, 3:23 PM

## 2017-01-03 NOTE — Care Management Important Message (Signed)
Important Message  Patient Details  Name: SHAUNI HENNER MRN: [id number] Date of Birth: 1940-12-16   Medicare Important Message Given:  Yes    Marily Memos, RN 01/03/2017, 2:34 PM

## 2017-01-03 NOTE — Progress Notes (Signed)
Subjective: 1 Day Post-Op Procedure(s) (LRB): KYPHOPLASTY L2 (N/A) Patient reports pain as moderate.   Patient seen in rounds with Dr. Rosita Kea. Patient is well, and has had no acute complaints or problems Plan is to go Rehab after hospital stay. Negative for chest pain and shortness of breath Fever: no Gastrointestinal: Negative for nausea and vomiting  Objective: Vital signs in last 24 hours: Temp:  [98 F (36.7 C)-100.3 F (37.9 C)] 98.5 F (36.9 C) (08/02 0432) Pulse Rate:  [81-98] 81 (08/02 0432) Resp:  [16-19] 19 (08/02 0432) BP: (109-144)/(58-83) 109/58 (08/02 0432) SpO2:  [91 %-97 %] 91 % (08/02 0432) Weight:  [73.9 kg (163 lb)] 73.9 kg (163 lb) (08/01 1059)  Intake/Output from previous day:  Intake/Output Summary (Last 24 hours) at 01/03/17 0608 Last data filed at 01/02/17 1935  Gross per 24 hour  Intake          1047.08 ml  Output              100 ml  Net           947.08 ml    Intake/Output this shift: Total I/O In: -  Out: 100 [Urine:100]  Labs:  Recent Labs  01/03/17 0419  HGB 11.1*    Recent Labs  01/03/17 0419  WBC 7.9  RBC 3.39*  HCT 33.5*  PLT 245   No results for input(s): NA, K, CL, CO2, BUN, CREATININE, GLUCOSE, CALCIUM in the last 72 hours.  Recent Labs  01/02/17 0358 01/03/17 0419  INR 1.34 1.43     EXAM General - Patient is Alert and Oriented Extremity - Sensation intact distally Dorsiflexion/Plantar flexion intact Compartment soft Dressing/Incision - clean, dry, no drainage Motor Function - intact, moving foot and toes well on exam.   Past Medical History:  Diagnosis Date  . Anemia   . Arthritis   . CAD (coronary artery disease)    NSTEMI in setting of gallstone pancreatitis 05/2011:  LHC with 3v CAD; CABG was performed 06/14/11: RIMA-LAD, SVG-ramus, SVG-RCA  . Carotid stenosis    Dopplers 06/12/11: LICA 60-79%.  . CHF (congestive heart failure) (HCC)   . Chronic bronchitis   . Chronic systolic heart failure (HCC)    . Collagen vascular disease (HCC)   . Dextrocardia   . DVT of leg (deep venous thrombosis) (HCC) ~2006   left  . Fracture 05/24/2011   right; "did not have surgery"  . GERD (gastroesophageal reflux disease)   . H/O hiatal hernia   . HLD (hyperlipidemia)   . Hypertension   . Ischemic cardiomyopathy    Echocardiogram 06/05/11: EF 40-45%, anteroseptal hypokinesis, apical hypokinesis, mild LAE  . Neuropathy   . Pneumonia 06/13/11   "couple times; long time ago"  . Pulmonary embolus [facility]) March 2013  . Rheumatoid arthritis (HCC)   . Situs inversus with dextrocardia    CT 06/2011: situs inversus totalis.  What would typically be called RCA arose from anterior sinus of Valsalva.  What would typically be called the left main arises from the posterior sinus of Valsalva and gives rise to a large first diagonal branch and diminutive circumflex    Assessment/Plan: 1 Day Post-Op Procedure(s) (LRB): KYPHOPLASTY L2 (N/A) Active Problems:   Vertebral compression fracture (HCC)  Estimated body mass index is 29.81 kg/m as calculated from the following:   Height as of this encounter: 5\' 2"  (1.575 m).   Weight as of this encounter: 73.9 kg (163 lb). Advance diet Up with therapy D/C IV  fluids  DVT Prophylaxis - Foot Pumps and TED hose Weight-Bearing as tolerated to bilateral legs  Dedra Skeens, PA-C Orthopaedic Surgery 01/03/2017, 6:08 AM

## 2017-01-03 NOTE — Progress Notes (Signed)
[facility] - [place] at [facility]   PATIENT NAME: Haset Oaxaca    MR#:  [id number]  DATE OF BIRTH:  1940-11-26  SUBJECTIVE:  CHIEF COMPLAINT:   Chief Complaint  Patient presents with  . Back Pain  . Emesis   Still has pain after kyphoplasty.Slowly improving  REVIEW OF SYSTEMS:    Review of Systems  Constitutional: Positive for malaise/fatigue. Negative for chills and fever.  HENT: Negative for sore throat.   Eyes: Negative for blurred vision, double vision and pain.  Respiratory: Negative for cough, hemoptysis, shortness of breath and wheezing.   Cardiovascular: Negative for chest pain, palpitations, orthopnea and leg swelling.  Gastrointestinal: Negative for abdominal pain, constipation, diarrhea, heartburn, nausea and vomiting.  Genitourinary: Negative for dysuria and hematuria.  Musculoskeletal: Positive for back pain. Negative for joint pain.  Skin: Negative for rash.  Neurological: Positive for weakness. Negative for sensory change, speech change, focal weakness and headaches.  Endo/Heme/Allergies: Does not bruise/bleed easily.  Psychiatric/Behavioral: Negative for depression. The patient is not nervous/anxious.    DRUG ALLERGIES:   Allergies  Allergen Reactions  . Penicillins Rash    Has patient had a PCN reaction causing immediate rash, facial/tongue/throat swelling, SOB or lightheadedness with hypotension: Yes Has patient had a PCN reaction causing severe rash involving mucus membranes or skin necrosis: No Has patient had a PCN reaction that required hospitalization: No Has patient had a PCN reaction occurring within the last 10 years: No If all of the above answers are "NO", then may proceed with Cephalosporin use.   . Shellfish Allergy Anaphylaxis  . Levaquin [Levofloxacin] Hives    VITALS:  Blood pressure 111/60, pulse 82, temperature 98.6 F (37 C), temperature source Oral, resp. rate 18, height 5\' 2"  (1.575 m), weight 73.9 kg (163 lb),  SpO2 93 %.  PHYSICAL EXAMINATION:   Physical Exam  GENERAL:  76 y.o.-year-old patient lying in the bed with no acute distress. EYES: Pupils equal, round, reactive to light and accommodation. No scleral icterus. Extraocular muscles intact. HEENT: Head atraumatic, normocephalic. Oropharynx and nasopharynx clear. NECK:  Supple, no jugular venous distention. No thyroid enlargement, no tenderness. LUNGS: Normal breath sounds bilaterally, no wheezing, rales, rhonchi. No use of accessory muscles of respiration. CARDIOVASCULAR: S1, S2 normal. No murmurs, rubs, or gallops. ABDOMEN: Soft, nontender, nondistended. Bowel sounds present. No organomegaly or mass. EXTREMITIES: No cyanosis, clubbing or edema b/l. NEUROLOGIC: Cranial nerves II through XII are intact. No focal Motor or sensory deficits b/l. PSYCHIATRIC: The patient is alert and awake SKIN: No obvious rash, lesion, or ulcer.  LABORATORY PANEL:   CBC  Recent Labs Lab 01/03/17 0419  WBC 7.9  HGB 11.1*  HCT 33.5*  PLT 245   ------------------------------------------------------------------------------------------------------------------ Chemistries   Recent Labs Lab 12/30/16 1024 12/31/16 0338  NA 137 143  K 3.2* 3.3*  CL 95* 103  CO2 31 34*  GLUCOSE 144* 86  BUN 21* 15  CREATININE 1.18* 1.08*  CALCIUM 9.1 8.7*  AST 31  --   ALT 22  --   ALKPHOS 93  --   BILITOT 1.3*  --    ------------------------------------------------------------------------------------------------------------------  Cardiac Enzymes  Recent Labs Lab 12/30/16 1024  TROPONINI <0.03   ------------------------------------------------------------------------------------------------------------------  RADIOLOGY:  Dg Thoracic Spine 2 View  Result Date: 01/02/2017 CLINICAL DATA:  Painful compression fracture. EXAM: DG C-ARM 61-120 MIN; THORACIC SPINE 2 VIEWS COMPARISON:  MRI 12/31/2016. FINDINGS: Intraoperative spot films demonstrate T12  vertebral augmentation. IMPRESSION: As above. Electronically Signed   By:  Elsie Stain M.D.   On: 01/02/2017 12:25   Dg C-arm 1-60 Min  Result Date: 01/02/2017 CLINICAL DATA:  Painful compression fracture. EXAM: DG C-ARM 61-120 MIN; THORACIC SPINE 2 VIEWS COMPARISON:  MRI 12/31/2016. FINDINGS: Intraoperative spot films demonstrate T12 vertebral augmentation. IMPRESSION: As above. Electronically Signed   By: Elsie Stain M.D.   On: 01/02/2017 12:25   ASSESSMENT AND PLAN:    * T12 vertebral compression fracture. Patient had MRI which confirms. IV and oral pain medications as needed In the hospital. Oxycodone and Robaxin at discharge. S/p Kyphoplasty but still having significant pain. Slowly improving.  Home with home health or SNF for physical therapy after PT evaluation.  * Vomiting due to medications. Resolved.  * History of pulmonary embolism on Coumadin. Vitamin K given for procedure.  Resume Coumadin.  * Diabetes mellitus. Glucophage held. On sliding scale insulin. Restart metformin at discharge.  * DVT prophylaxis.  SCDs   All the records are reviewed and case discussed with Care Management/Social Worker Management plans discussed with the patient, family and they are in agreement.  CODE STATUS: FULL CODE  DVT Prophylaxis: SCDs  TOTAL TIME TAKING CARE OF THIS PATIENT: 35 minutes.   POSSIBLE D/C IN 1-2 DAYS, DEPENDING ON CLINICAL CONDITION.  Milagros Loll R M.D on 01/03/2017 at 11:04 AM  Between 7am to 6pm - Pager - [phone]  After 6pm go to [email or web address] - password [email or web address]  [facility]  Office  [phone]  CC: Primary care physician; Elias Else, MD  Note: This dictation was prepared with Dragon dictation along with smaller phrase technology. Any transcriptional errors that result from this process are unintentional.

## 2017-01-03 NOTE — [place] FL2 (Signed)
[place] MEDICAID FL2 LEVEL OF CARE SCREENING TOOL     IDENTIFICATION  Patient Name: [name] Birthdate: 1941-05-11 Sex: female Admission Date (Current Location): 12/30/2016  [place] and [place] Number:  Chiropodist and Address:  [facility], [address], [place], [place] [address]      Provider Number: [id number]  Attending Physician Name and Address:  Milagros Loll, MD  Relative Name and Phone Number:       Current Level of Care: Hospital Recommended Level of Care: Skilled Nursing Facility Prior Approval Number:    Date Approved/Denied:   PASRR Number:  ([id number] )  Discharge Plan: SNF    Current Diagnoses: Patient Active Problem List   Diagnosis Date Noted  . Vertebral compression fracture (HCC) 12/30/2016  . CAP (community acquired pneumonia) 05/25/2015  . Encounter for therapeutic drug monitoring 08/05/2013  . 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 CAD (coronary artery disease) 09/11/2011  . 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 Acute respiratory failure with hypoxia (HCC) 08/12/2011  . Saddle embolism of pulmonary artery (HCC) 08/12/2011  . Pericardial effusion 08/12/2011  . Atrial mass 08/12/2011  . Dyspnea 08/07/2011  . CAD 07/10/2011  . Cough 07/10/2011  . DM2 (diabetes mellitus, type 2) (HCC) 07/10/2011  . HLD (hyperlipidemia) 07/10/2011  . Carotid stenosis   . Hypokalemia 06/09/2011  . Hypomagnesemia 06/09/2011  . Dextrocardia 06/09/2011  . Acute myocardial infarction, subendocardial infarction, subsequent episode of care (HCC) 06/08/2011  . Chronic systolic heart failure (HCC) 06/08/2011  . Acute renal failure (HCC) 06/08/2011    Orientation RESPIRATION BLADDER Height & Weight     Self, Time, Situation, Place  O2 (2 Liters Oxygen ) Continent Weight: 163 lb (73.9 kg) Height:  5\' 2"  (157.5 cm)  BEHAVIORAL SYMPTOMS/MOOD NEUROLOGICAL BOWEL NUTRITION STATUS   (none)  (none)  Continent Diet (Regular Diet )  AMBULATORY STATUS COMMUNICATION OF NEEDS Skin   Extensive Assist Verbally Surgical wounds (Back incision. )                       Personal Care Assistance Level of Assistance  Bathing, Feeding, Dressing Bathing Assistance: Limited assistance Feeding assistance: Independent Dressing Assistance: Limited assistance     Functional Limitations Info  Sight, Hearing, Speech Sight Info: Adequate Hearing Info: Adequate Speech Info: Adequate    SPECIAL CARE FACTORS FREQUENCY  PT (By licensed PT), OT (By licensed OT)     PT Frequency:  (5) OT Frequency:  (5)            Contractures      Additional Factors Info  Code Status, Allergies Code Status Info:  (Full Code. ) Allergies Info:  (Penicillins, Shellfish Allergy, Levaquin Levofloxacin)           Current Medications (01/03/2017):  This is the current hospital active medication list Current Facility-Administered Medications  Medication Dose Route Frequency Provider Last Rate Last Dose  . acetaminophen (TYLENOL) tablet 650 mg  650 mg Oral Q6H PRN 03/05/2017, MD       Or  . acetaminophen (TYLENOL) suppository 650 mg  650 mg Rectal Q6H PRN Gracelyn Nurse, MD      . allopurinol (ZYLOPRIM) tablet 100 mg  100 mg Oral BID Gracelyn Nurse, MD   100 mg at 01/02/17 2145  . bisacodyl (DULCOLAX) suppository 10 mg  10 mg Rectal Daily PRN 2146, MD      . cholecalciferol (VITAMIN D) tablet  1,000 Units  1,000 Units Oral Daily Gracelyn Nurse, MD   1,000 Units at 01/01/17 0915  . docusate sodium (COLACE) capsule 100 mg  100 mg Oral BID Kennedy Bucker, MD   100 mg at 01/02/17 2143  . folic acid (FOLVITE) tablet 1 mg  1,000 mcg Oral Daily Gracelyn Nurse, MD   1 mg at 01/01/17 0915  . gabapentin (NEURONTIN) capsule 300 mg  300 mg Oral BID Gracelyn Nurse, MD   300 mg at 01/02/17 2143  . insulin aspart (novoLOG) injection 0-5 Units  0-5 Units Subcutaneous QHS Marcelino Duster D, MD      .  insulin aspart (novoLOG) injection 0-9 Units  0-9 Units Subcutaneous TID WC Gracelyn Nurse, MD   1 Units at 01/03/17 [phone]  . ipratropium-albuterol (DUONEB) 0.5-2.5 (3) MG/3ML nebulizer solution 3 mL  3 mL Nebulization Q6H PRN Gracelyn Nurse, MD      . ketorolac (TORADOL) 30 MG/ML injection 30 mg  30 mg Intravenous Once Sudini, Wardell Heath, MD      . magnesium hydroxide (MILK OF MAGNESIA) suspension 30 mL  30 mL Oral Daily PRN Kennedy Bucker, MD      . methocarbamol (ROBAXIN) tablet 500 mg  500 mg Oral Q6H PRN Kennedy Bucker, MD   500 mg at 01/03/17 8338   Or  . methocarbamol (ROBAXIN) 500 mg in dextrose 5 % 50 mL IVPB  500 mg Intravenous Q6H PRN Kennedy Bucker, MD      . methotrexate (RHEUMATREX) tablet 20 mg  20 mg Oral Q Anson Crofts, MD   20 mg at 12/31/16 0848  . metoCLOPramide (REGLAN) tablet 5-10 mg  5-10 mg Oral Q8H PRN Kennedy Bucker, MD       Or  . metoCLOPramide (REGLAN) injection 5-10 mg  5-10 mg Intravenous Q8H PRN Kennedy Bucker, MD      . metoprolol tartrate (LOPRESSOR) tablet 25 mg  25 mg Oral BID Gracelyn Nurse, MD   25 mg at 01/02/17 2143  . ondansetron (ZOFRAN) tablet 4 mg  4 mg Oral Q6H PRN Gracelyn Nurse, MD       Or  . ondansetron [facility]) injection 4 mg  4 mg Intravenous Q6H PRN Gracelyn Nurse, MD   4 mg at 01/03/17 0902  . oxyCODONE (Oxy IR/ROXICODONE) immediate release tablet 5 mg  5 mg Oral Q4H PRN Gracelyn Nurse, MD   5 mg at 01/02/17 1938  . pantoprazole (PROTONIX) EC tablet 40 mg  40 mg Oral Daily Gracelyn Nurse, MD   40 mg at 01/01/17 0915  . polyethylene glycol (MIRALAX / GLYCOLAX) packet 17 g  17 g Oral BID Milagros Loll, MD   17 g at 01/02/17 2147  . senna-docusate (Senokot-S) tablet 1 tablet  1 tablet Oral BID Milagros Loll, MD   1 tablet at 01/02/17 2143  . simvastatin (ZOCOR) tablet 20 mg  20 mg Oral QPM Gracelyn Nurse, MD   20 mg at 01/02/17 1746  . warfarin (COUMADIN) tablet 5 mg  5 mg Oral q1800 Kennedy Bucker, MD   5 mg at 01/02/17 1746  .  Warfarin - Physician Dosing Inpatient   Does not apply q1800 Milagros Loll, MD         Discharge Medications: Please see discharge summary for a list of discharge medications.  Relevant Imaging Results:  Relevant Lab Results:   Additional Information  (SSN: [id number])  Teryn Gust, Darleen Crocker, LCSW

## 2017-01-03 NOTE — Clinical Social Work Note (Signed)
Clinical Social Work Assessment  Patient Details  Name: [name] MRN: [id number] Date of Birth: 1940-12-06  Date of referral:  01/03/17               Reason for consult:  Facility Placement                Permission sought to share information with:  Chartered certified accountant granted to share information::  Yes, Verbal Permission Granted  Name::      [place]::   [place]   Relationship::     Contact Information:     Housing/Transportation Living arrangements for the past 2 months:  [place] of Information:  Patient, Adult Children Patient Interpreter Needed:  None Criminal Activity/Legal Involvement Pertinent to Current Situation/Hospitalization:  No - Comment as needed Significant Relationships:  Adult Children Lives with:  Self Do you feel safe going back to the place where you live?  Yes Need for family participation in patient care:  Yes (Comment)  Care giving concerns:  Patient lives alone in [place], [place] and has 3 adult children that live near by.    Social Worker assessment / plan:  Holiday representative (CSW) received verbal consult from PT that recommendation is SNF. Per MD patient is medically stable for D/C today. CSW met with patient to discuss D/C plan. Patient was alert and oriented X4 and was sitting up in the chair at bedside. CSW introduced self and explained role of CSW department. Patient reported that she lives alone and has 3 children. Patient reported that her daughter [name] is her primary support and requested for CSW to call her daughter to update her on D/C plan. CSW explained SNF process and that [place] requires authorization. Patient reported that she can't pay privately for SNF if [facility] denies SNF authorization. Patient is agreeable to SNF search in [facility]. FL2 complete and faxed out.   CSW presented bed offers and patient chose [facility]. Per [facility] admissions  coordinator at [facility] they will start [place] authorization today and will accept a 5 day LOG. Water engineer approved 5 day LOG. Patient is medically stable for D/C to [facility] today. Per Magda Paganini patient can come today to room 408. RN will call report to 400 hall RN and arrange EMS for transport. CSW made patient aware that if [facility] denies SNF she will have to discharge from [facility] after 5 days. CSW contacted patient's daughter [name] and made her aware of above. [name] is in agreement with D/C plan. Please reconsult if future social work needs arise. CSW signing off.   Employment status:  Retired, Disabled (Comment on whether or not currently receiving Disability) Insurance information:  Managed Medicare PT Recommendations:  [place] / Referral to community resources:  [place]  Patient/Family's Response to care:  Patient and her daughter are agreeable for patient to go to [facility] today.   Patient/Family's Understanding of and Emotional Response to Diagnosis, Current Treatment, and Prognosis:  Patient and her daughter were very pleasant and thanked CSW for assistance.   Emotional Assessment Appearance:  Appears stated age Attitude/Demeanor/Rapport:    Affect (typically observed):  Accepting, Adaptable, Pleasant Orientation:  Oriented to Self, Oriented to Place, Oriented to  Time, Oriented to Situation Alcohol / Substance use:  Not Applicable Psych involvement (Current and /or in the community):  No (Comment)  Discharge Needs  Concerns to be addressed:  Discharge Planning  Concerns Readmission within the last 30 days:  No Current discharge risk:  Dependent with Mobility Barriers to Discharge:  No Barriers Identified   Jevonte Clanton, Veronia Beets, LCSW 01/03/2017, 3:23 PM

## 2017-01-03 NOTE — Progress Notes (Signed)
Patient discharged to [facility] via EMS transport. Sister at bedside. Daughter updated on patients departure time.

## 2017-01-03 NOTE — Evaluation (Signed)
Physical Therapy Evaluation Patient Details Name: [name] MRN: [id number] DOB: 1940-12-05 Today's Date: 01/03/2017   History of Present Illness  Pt is a 76 y/o F who presented with back pain, she does not remember falling or injuring it. X-ray showed T12 compression fracture and is now s/p kyphoplasty. Pt's PMH includes NSTEMI, CHF, DVT, PE, neuropathy.    Clinical Impression  Pt admitted with above diagnosis. Pt currently with functional limitations due to the deficits listed below (see PT Problem List). [name] is from home where she lives alone and does not have any assist available from family or friends.  She current requires min assist for bed mobility and for tranfers due to pain, weakness, nausea, and instability.  Given pt's current mobility status, recommending SNF at d/c.  Pt will benefit from skilled PT to increase their independence and safety with mobility to allow discharge to the venue listed below.      Follow Up Recommendations SNF    Equipment Recommendations  None recommended by PT    Recommendations for Other Services       Precautions / Restrictions Precautions Precautions: Fall;Other (comment) Precaution Comments: Monitor O2 Restrictions Weight Bearing Restrictions: No      Mobility  Bed Mobility Overal bed mobility: Needs Assistance Bed Mobility: Rolling;Sidelying to Sit Rolling: Min assist Sidelying to sit: Min assist;HOB elevated       General bed mobility comments: Cues for log roll technique as pt utilizes bed rail to roll.  Assist provided to advance LEs to EOB and to push up into sitting.    Transfers Overall transfer level: Needs assistance Equipment used: Rolling walker (2 wheeled) Transfers: Sit to/from [employer] Sit to Stand: Min guard Stand pivot transfers: Min assist       General transfer comment: Pt is slow to stand and requires cues for hand placement.  Min assist when pivoting for stability and for  management of RW.  Pt performs slowly.  Ambulation/Gait             General Gait Details: Pt politely refused due to nausea and pain.  Stairs            Wheelchair Mobility    Modified Rankin (Stroke Patients Only)       Balance Overall balance assessment: Needs assistance Sitting-balance support: No upper extremity supported;Feet supported Sitting balance-Leahy Scale: Good     Standing balance support: During functional activity;Bilateral upper extremity supported Standing balance-Leahy Scale: Poor Standing balance comment: Relies on UE support for static and dynamic activities                             Pertinent Vitals/Pain Pain Assessment: Faces Faces Pain Scale: Hurts whole lot Pain Location: Back Pain Descriptors / Indicators: Aching;Grimacing;Guarding;Moaning Pain Intervention(s): Limited activity within patient's tolerance;Monitored during session;Repositioned    Home Living Family/patient expects to be discharged to:: Private residence Living Arrangements: Alone Available Help at Discharge: Family;Available PRN/intermittently (Daughter and son both work in [place] and work full time) Type of Home: Apartment Home Access: Level entry     Home Layout: One level Home Equipment: Environmental consultant - 2 wheels;Cane - single point;Shower seat;Grab bars - tub/shower      Prior Function Level of Independence: Independent with assistive device(s)         Comments: Pt occasionally uses RW when she feels weak or tired. She does her own cooking, cleaning.  Is independent with bathing,  dressing, driving.     Hand Dominance   Dominant Hand: Right    Extremity/Trunk Assessment   Upper Extremity Assessment Upper Extremity Assessment:  (BUE strength grossly 4/5)    Lower Extremity Assessment Lower Extremity Assessment:  (BLE strength grossly 3+/5)    Cervical / Trunk Assessment Cervical / Trunk Assessment: Other exceptions Cervical / Trunk  Exceptions: s/p kyphoplasty  Communication   Communication: No difficulties  Cognition Arousal/Alertness: Awake/alert Behavior During Therapy: WFL for tasks assessed/performed Overall Cognitive Status: Within Functional Limits for tasks assessed                                        General Comments General comments (skin integrity, edema, etc.): SpO2 down to 86% on 2L O2 with mobility.  Up to 89% on 2L at end of session when pt resting in chair.    Exercises General Exercises - Lower Extremity Heel Slides: AROM;Both;10 reps;Supine   Assessment/Plan    PT Assessment Patient needs continued PT services  PT Problem List Decreased strength;Decreased activity tolerance;Decreased balance;Decreased mobility;Decreased knowledge of use of DME;Decreased safety awareness;Cardiopulmonary status limiting activity;Pain       PT Treatment Interventions DME instruction;Gait training;Functional mobility training;Therapeutic activities;Therapeutic exercise;Balance training;Neuromuscular re-education;Patient/family education;Modalities;Wheelchair mobility training    PT Goals (Current goals can be found in the Care Plan section)  Acute Rehab PT Goals Patient Stated Goal: decreased pain PT Goal Formulation: With patient Time For Goal Achievement: 01/17/17 Potential to Achieve Goals: Good    Frequency 7X/week   Barriers to discharge Decreased caregiver support Lives alone    Co-evaluation               AM-PAC PT "6 Clicks" Daily Activity  Outcome Measure Difficulty turning over in bed (including adjusting bedclothes, sheets and blankets)?: Total Difficulty moving from lying on back to sitting on the side of the bed? : Total Difficulty sitting down on and standing up from a chair with arms (e.g., wheelchair, bedside commode, etc,.)?: Total Help needed moving to and from a bed to chair (including a wheelchair)?: A Little Help needed walking in hospital room?: A  Little Help needed climbing 3-5 steps with a railing? : A Lot 6 Click Score: 11    End of Session Equipment Utilized During Treatment: Gait belt;Oxygen Activity Tolerance: Patient limited by pain;Other (comment) (limited by nausea) Patient left: in chair;with call bell/phone within reach;with chair alarm set Nurse Communication: Mobility status;Other (comment) (SpO2) PT Visit Diagnosis: Muscle weakness (generalized) (M62.81);Unsteadiness on feet (R26.81);Other abnormalities of gait and mobility (R26.89);Pain Pain - Right/Left:  (middle) Pain - part of body:  (back)    Time: 2992-4268 PT Time Calculation (min) (ACUTE ONLY): 32 min   Charges:   PT Evaluation $PT Eval Low Complexity: 1 Low PT Treatments $Therapeutic Activity: 8-22 mins   PT G Codes:        Encarnacion Chu PT, DPT 01/03/2017, 2:41 PM

## 2017-01-03 NOTE — Discharge Summary (Signed)
[facility] - [place] at [facility]   PATIENT NAME: [name]    MR#:  [id number]  DATE OF BIRTH:  [birth year]  DATE OF ADMISSION:  12/30/2016 ADMITTING PHYSICIAN: Gracelyn Nurse, MD  DATE OF DISCHARGE: No discharge date for patient encounter.  PRIMARY CARE PHYSICIAN: Elias Else, MD   ADMISSION DIAGNOSIS:  Dehydration [E86.0] Compression fracture of body of thoracic vertebra (HCC) [M48.54XA] Nausea and vomiting, intractability of vomiting not specified, unspecified vomiting type [R11.2]  DISCHARGE DIAGNOSIS:  Active Problems:   Vertebral compression fracture (HCC)   SECONDARY DIAGNOSIS:   Past Medical History:  Diagnosis Date  . Anemia   . Arthritis   . CAD (coronary artery disease)    NSTEMI in setting of gallstone pancreatitis 05/2011:  LHC with 3v CAD; CABG was performed 06/14/11: RIMA-LAD, SVG-ramus, SVG-RCA  . Carotid stenosis    Dopplers 06/12/11: LICA 60-79%.  . CHF (congestive heart failure) (HCC)   . Chronic bronchitis   . Chronic systolic heart failure (HCC)   . Collagen vascular disease (HCC)   . Dextrocardia   . DVT of leg (deep venous thrombosis) (HCC) ~2006   left  . Fracture 05/24/2011   right; "did not have surgery"  . GERD (gastroesophageal reflux disease)   . H/O hiatal hernia   . HLD (hyperlipidemia)   . Hypertension   . Ischemic cardiomyopathy    Echocardiogram 06/05/11: EF 40-45%, anteroseptal hypokinesis, apical hypokinesis, mild LAE  . Neuropathy   . Pneumonia 06/13/11   "couple times; long time ago"  . Pulmonary embolus [facility]) March 2013  . Rheumatoid arthritis (HCC)   . Situs inversus with dextrocardia    CT 06/2011: situs inversus totalis.  What would typically be called RCA arose from anterior sinus of Valsalva.  What would typically be called the left main arises from the posterior sinus of Valsalva and gives rise to a large first diagonal branch and diminutive circumflex     ADMITTING HISTORY  Chief Complaint: Back  pain HPI: This is 76 year old female who started having back pain on Thursday. She does not recall falling or injuring it in any way. Pain became severe and she saw her primary care doctor the next day. He was prescribed hydrocodone and sent for x-rays. X-ray showed a possible T12 compression fracture. Since taking the pain medications she has not had the pain controlled and she's had nausea and vomiting after taking it. Has not had any by mouth intake hardly at all last 24 hours. She's only admitted for pain control and failure of outpatient therapy.  HOSPITAL COURSE:   * T12 vertebral compression fracture. Patient had MRI which confirms. IV and oral pain medications as needed In the hospital. Oxycodone and Robaxin at discharge. S/p Kyphoplasty but still having significant pain. Slowly improving.  Home with home health or SNF for physical therapy after PT evaluation.  * Vomiting due to medications. Resolved.  * History of pulmonary embolism on Coumadin. Vitamin K given for procedure.  Resume Coumadin.  * Diabetes mellitus. Glucophage held. On sliding scale insulin. Restart metformin at discharge  * DVT prophylaxis.  SCDs in the hospital.  Patient stable for discharge  CONSULTS OBTAINED:  Treatment Team:  Kennedy Bucker, MD  DRUG ALLERGIES:   Allergies  Allergen Reactions  . Penicillins Rash    Has patient had a PCN reaction causing immediate rash, facial/tongue/throat swelling, SOB or lightheadedness with hypotension: Yes Has patient had a PCN reaction causing severe rash involving mucus membranes or skin  necrosis: No Has patient had a PCN reaction that required hospitalization: No Has patient had a PCN reaction occurring within the last 10 years: No If all of the above answers are "NO", then may proceed with Cephalosporin use.   . Shellfish Allergy Anaphylaxis  . Levaquin [Levofloxacin] Hives    DISCHARGE MEDICATIONS:   Current Discharge Medication List    START  taking these medications   Details  methocarbamol (ROBAXIN) 500 MG tablet Take 1 tablet (500 mg total) by mouth every 6 (six) hours as needed for muscle spasms. Qty: 30 tablet, Refills: 0    oxyCODONE (OXY IR/ROXICODONE) 5 MG immediate release tablet Take 1 tablet (5 mg total) by mouth every 6 (six) hours as needed for severe pain. Qty: 20 tablet, Refills: 0      CONTINUE these medications which have NOT CHANGED   Details  alendronate (FOSAMAX) 70 MG tablet Take 70 mg by mouth every Sunday.     allopurinol (ZYLOPRIM) 100 MG tablet Take 100 mg by mouth 2 (two) times daily.     aspirin EC 81 MG tablet Take 81 mg by mouth daily.    cholecalciferol (VITAMIN D) 1000 UNITS tablet Take 1,000 Units by mouth daily.    folic acid (FOLVITE) 800 MCG tablet Take 800 mcg by mouth daily.     furosemide (LASIX) 40 MG tablet Take 1 tablet (40 mg total) by mouth daily. Qty: 90 tablet, Refills: 3    gabapentin (NEURONTIN) 300 MG capsule Take 300 mg by mouth 2 (two) times daily.      ipratropium-albuterol (DUONEB) 0.5-2.5 (3) MG/3ML SOLN Take 3 mLs by nebulization every 6 (six) hours as needed. Qty: 360 mL, Refills: 5    metFORMIN (GLUCOPHAGE-XR) 500 MG 24 hr tablet Take 500 mg by mouth daily.    methotrexate (RHEUMATREX) 2.5 MG tablet Take 20 mg by mouth every Monday.     metoprolol tartrate (LOPRESSOR) 25 MG tablet Take 1 tablet (25 mg total) by mouth 2 (two) times daily. Qty: 180 tablet, Refills: 3    omeprazole (PRILOSEC) 20 MG capsule Take 20 mg by mouth every morning.      simvastatin (ZOCOR) 20 MG tablet Take 20 mg by mouth every evening.     warfarin (COUMADIN) 2 MG tablet Take as directed by Coumadin Clinic Qty: 65 tablet, Refills: 3    Respiratory Therapy Supplies (FLUTTER) DEVI Use 10-15 times daily Qty: 1 each, Refills: 0        Today   VITAL SIGNS:  Blood pressure 111/60, pulse 82, temperature 98.6 F (37 C), temperature source Oral, resp. rate 18, height 5\' 2" (1.575  m), weight 73.9 kg (163 lb), SpO2 93 %.  I/O:   Intake/Output Summary (Last 24 hours) at 01/03/17 1102 Last data filed at 01/03/17 1014  Gross per 24 hour  Intake          1047.08 ml  Output              100 ml  Net           94 7.08 ml    PHYSICAL EXAMINATION:  Physical Exam  GENERAL:  76 y.o.-year-old patient lying in the bed with no acute distress.  LUNGS: Normal breath sounds bilaterally, no wheezing, rales,rhonchi or crepitation. No use of accessory muscles of respiration.  CARDIOVASCULAR: S1, S2 normal. No murmurs, rubs, or gallops.  ABDOMEN: Soft, non-tender, non-distended. Bowel sounds present. No organomegaly or mass.  NEUROLOGIC: Moves all 4 extremities. PSYCHIATRIC: The patient is  alert and oriented x 3.  SKIN: No obvious rash, lesion, or ulcer.   DATA REVIEW:   CBC  Recent Labs Lab 01/03/17 0419  WBC 7.9  HGB 11.1*  HCT 33.5*  PLT 245    Chemistries   Recent Labs Lab 12/30/16 1024 12/31/16 0338  NA 137 143  K 3.2* 3.3*  CL 95* 103  CO2 31 34*  GLUCOSE 144* 86  BUN 21* 15  CREATININE 1.18* 1.08*  CALCIUM 9.1 8.7*  AST 31  --   ALT 22  --   ALKPHOS 93  --   BILITOT 1.3*  --     Cardiac Enzymes  Recent Labs Lab 12/30/16 1024  TROPONINI <0.03    Microbiology Results  Results for orders placed or performed during the hospital encounter of 12/30/16  MRSA PCR Screening     Status: None   Collection Time: 01/01/17  8:24 AM  Result Value Ref Range Status   MRSA by PCR NEGATIVE NEGATIVE Final    Comment:        The GeneXpert MRSA Assay (FDA approved for NASAL specimens only), is one component of a comprehensive MRSA colonization surveillance program. It is not intended to diagnose MRSA infection nor to guide or monitor treatment for MRSA infections.     RADIOLOGY:  Dg Thoracic Spine 2 View  Result Date: 01/02/2017 CLINICAL DATA:  Painful compression fracture. EXAM: DG C-ARM 61-120 MIN; THORACIC SPINE 2 VIEWS COMPARISON:  MRI  12/31/2016. FINDINGS: Intraoperative spot films demonstrate T12 vertebral augmentation. IMPRESSION: As above. Electronically Signed   By: Elsie Stain M.D.   On: 01/02/2017 12:25   Dg C-arm 1-60 Min  Result Date: 01/02/2017 CLINICAL DATA:  Painful compression fracture. EXAM: DG C-ARM 61-120 MIN; THORACIC SPINE 2 VIEWS COMPARISON:  MRI 12/31/2016. FINDINGS: Intraoperative spot films demonstrate T12 vertebral augmentation. IMPRESSION: As above. Electronically Signed   By: Elsie Stain M.D.   On: 01/02/2017 12:25    Follow up with PCP in 1 week.  Management plans discussed with the patient, family and they are in agreement.  CODE STATUS:     Code Status Orders        Start     Ordered   12/30/16 1434  Full code  Continuous     12/30/16 1433    Code Status History    Date Active Date Inactive Code Status Order ID Comments User Context   05/25/2015  1:57 PM 05/27/2015  6:16 PM DNR [id number]  Adrian Saran, MD Inpatient   06/19/2011  8:17 PM 06/26/2011  1:59 PM Full Code [id number]  Antonieta Iba, RN Inpatient    Advance Directive Documentation     Most Recent Value  Type of Advance Directive  Healthcare Power of Attorney, Living will  Pre-existing out of facility DNR order (yellow form or pink MOST form)  -  "MOST" Form in Place?  -      TOTAL TIME TAKING CARE OF THIS PATIENT ON DAY OF DISCHARGE: more than 30 minutes.   Milagros Loll R M.D on 01/03/2017 at 11:02 AM  Between 7am to 6pm - Pager - [phone]  After 6pm go to [email or web address] - password [email or web address]  [facility] [place] [facility]  Office  [phone]  CC: Primary care physician; Elias Else, MD  Note: This dictation was prepared with Dragon dictation along with smaller phrase technology. Any transcriptional errors that result from this process are unintentional.

## 2017-01-03 NOTE — Progress Notes (Signed)
[employer] - [place] at [facility]   PATIENT NAME: [name]    MR#:  [id number]  DATE OF BIRTH:  1940-07-06  SUBJECTIVE:  CHIEF COMPLAINT:   Chief Complaint  Patient presents with  . Back Pain  . Emesis   Still has pain after khyphoplasty  REVIEW OF SYSTEMS:    Review of Systems  Constitutional: Positive for malaise/fatigue. Negative for chills and fever.  HENT: Negative for sore throat.   Eyes: Negative for blurred vision, double vision and pain.  Respiratory: Negative for cough, hemoptysis, shortness of breath and wheezing.   Cardiovascular: Negative for chest pain, palpitations, orthopnea and leg swelling.  Gastrointestinal: Negative for abdominal pain, constipation, diarrhea, heartburn, nausea and vomiting.  Genitourinary: Negative for dysuria and hematuria.  Musculoskeletal: Positive for back pain. Negative for joint pain.  Skin: Negative for rash.  Neurological: Positive for weakness. Negative for sensory change, speech change, focal weakness and headaches.  Endo/Heme/Allergies: Does not bruise/bleed easily.  Psychiatric/Behavioral: Negative for depression. The patient is not nervous/anxious.    DRUG ALLERGIES:   Allergies  Allergen Reactions  . Penicillins Rash    Has patient had a PCN reaction causing immediate rash, facial/tongue/throat swelling, SOB or lightheadedness with hypotension: Yes Has patient had a PCN reaction causing severe rash involving mucus membranes or skin necrosis: No Has patient had a PCN reaction that required hospitalization: No Has patient had a PCN reaction occurring within the last 10 years: No If all of the above answers are "NO", then may proceed with Cephalosporin use.   . Shellfish Allergy Anaphylaxis  . Levaquin [Levofloxacin] Hives    VITALS:  Blood pressure (!) 109/58, pulse 81, temperature 98.5 F (36.9 C), temperature source Oral, resp. rate 19, height 5\' 2"  (1.575 m), weight 73.9 kg (163 lb), SpO2 91  %.  PHYSICAL EXAMINATION:   Physical Exam  GENERAL:  76 y.o.-year-old patient lying in the bed with no acute distress. EYES: Pupils equal, round, reactive to light and accommodation. No scleral icterus. Extraocular muscles intact. HEENT: Head atraumatic, normocephalic. Oropharynx and nasopharynx clear. NECK:  Supple, no jugular venous distention. No thyroid enlargement, no tenderness. LUNGS: Normal breath sounds bilaterally, no wheezing, rales, rhonchi. No use of accessory muscles of respiration. CARDIOVASCULAR: S1, S2 normal. No murmurs, rubs, or gallops. ABDOMEN: Soft, nontender, nondistended. Bowel sounds present. No organomegaly or mass. EXTREMITIES: No cyanosis, clubbing or edema b/l. NEUROLOGIC: Cranial nerves II through XII are intact. No focal Motor or sensory deficits b/l. PSYCHIATRIC: The patient is alert and awake SKIN: No obvious rash, lesion, or ulcer.  LABORATORY PANEL:   CBC  Recent Labs Lab 01/03/17 0419  WBC 7.9  HGB 11.1*  HCT 33.5*  PLT 245   ------------------------------------------------------------------------------------------------------------------ Chemistries   Recent Labs Lab 12/30/16 1024 12/31/16 0338  NA 137 143  K 3.2* 3.3*  CL 95* 103  CO2 31 34*  GLUCOSE 144* 86  BUN 21* 15  CREATININE 1.18* 1.08*  CALCIUM 9.1 8.7*  AST 31  --   ALT 22  --   ALKPHOS 93  --   BILITOT 1.3*  --    ------------------------------------------------------------------------------------------------------------------  Cardiac Enzymes  Recent Labs Lab 12/30/16 1024  TROPONINI <0.03   ------------------------------------------------------------------------------------------------------------------  RADIOLOGY:  Dg Thoracic Spine 2 View  Result Date: 01/02/2017 CLINICAL DATA:  Painful compression fracture. EXAM: DG C-ARM 61-120 MIN; THORACIC SPINE 2 VIEWS COMPARISON:  MRI 12/31/2016. FINDINGS: Intraoperative spot films demonstrate T12 vertebral  augmentation. IMPRESSION: As above. Electronically Signed   By:  Elsie Stain M.D.   On: 01/02/2017 12:25   Dg C-arm 1-60 Min  Result Date: 01/02/2017 CLINICAL DATA:  Painful compression fracture. EXAM: DG C-ARM 61-120 MIN; THORACIC SPINE 2 VIEWS COMPARISON:  MRI 12/31/2016. FINDINGS: Intraoperative spot films demonstrate T12 vertebral augmentation. IMPRESSION: As above. Electronically Signed   By: Elsie Stain M.D.   On: 01/02/2017 12:25   ASSESSMENT AND PLAN:   * T12 vertebral compression fracture. Patient had MRI which confirms. IV and oral pain medications as needed.  S/p Kyphoplasty but still having significant pain. PT to see patient. Home vs SNF  * Vomiting due to medications. Resolved.  * History of pulmonary embolism on Coumadin. Vitamin K given for procedure. We will resume after kyphoplasty.  * Diabetes mellitus. Glucophage held. On sliding scale insulin.  * DVT prophylaxis. INR is 1.3 SCDs ordered.  All the records are reviewed and case discussed with Care Management/Social Worker Management plans discussed with the patient, family and they are in agreement.  CODE STATUS: FULL CODE  DVT Prophylaxis: SCDs  TOTAL TIME TAKING CARE OF THIS PATIENT: 35 minutes.   POSSIBLE D/C IN 1-2 DAYS, DEPENDING ON CLINICAL CONDITION.  Milagros Loll R M.D on 01/03/2017 at 6:59 AM  Between 7am to 6pm - Pager - [phone]  After 6pm go to [email or web address] - password [email or web address]  [facility] [place] [facility]  Office  [phone]  CC: Primary care physician; Elias Else, MD  Note: This dictation was prepared with Dragon dictation along with smaller phrase technology. Any transcriptional errors that result from this process are unintentional.

## 2017-01-03 NOTE — Progress Notes (Signed)
Pt alert and oriented but forgetful at times. Up to bedside commode with assistance this shift. Medicated for pain x1 with good results. Pt has been able to sleep in between care.

## 2017-01-04 ENCOUNTER — Encounter: Payer: Self-pay | Admitting: Orthopedic Surgery

## 2017-01-04 DIAGNOSIS — M0579 Rheumatoid arthritis with rheumatoid factor of multiple sites without organ or systems involvement: Secondary | ICD-10-CM | POA: Diagnosis not present

## 2017-01-04 DIAGNOSIS — I2782 Chronic pulmonary embolism: Secondary | ICD-10-CM | POA: Diagnosis not present

## 2017-01-04 DIAGNOSIS — E1129 Type 2 diabetes mellitus with other diabetic kidney complication: Secondary | ICD-10-CM | POA: Diagnosis not present

## 2017-01-04 DIAGNOSIS — M8000XD Age-related osteoporosis with current pathological fracture, unspecified site, subsequent encounter for fracture with routine healing: Secondary | ICD-10-CM | POA: Diagnosis not present

## 2017-01-04 DIAGNOSIS — J9611 Chronic respiratory failure with hypoxia: Secondary | ICD-10-CM | POA: Diagnosis not present

## 2017-01-16 DIAGNOSIS — Z9889 Other specified postprocedural states: Secondary | ICD-10-CM | POA: Diagnosis not present

## 2017-01-17 ENCOUNTER — Other Ambulatory Visit: Payer: Self-pay | Admitting: *Deleted

## 2017-01-17 DIAGNOSIS — J441 Chronic obstructive pulmonary disease with (acute) exacerbation: Secondary | ICD-10-CM

## 2017-01-17 NOTE — Patient Outreach (Signed)
[facility]) Care Management  01/17/2017  [name] 07/23/1940 [id number]   Met with [name], PT director, SW at facility on vacation. He reports that patient has had issues with orthostatic hypotension this week and the NP has made some medication changes with lasix and coumadin. He states that patient was already set to go home and time was up for appeal process. He worries that patient is high risk for readmission.   They are setting up [facility] home care.   Met with patient. Patient reports she lives alone, her children and sister are supportive and in and out of the home. She states she may need some transportation to appointments.  Patient has history of diabetes, COPD on oxygen, and now orthostatic hypotension.   RNCM reviewed [facility] care management program. Patient agreed to program and signed consent obtained. Packet left with patient that included 24 hour nurse line magnet and [facility] brochure  Plan to refer to: [facility] RNCM for transition of care [facility] LCSW for community resources-transportation  Mary E. Laymond Purser, RN, BSN, [place] [phone]) Business Cell  [phone]) Toll Free Office

## 2017-01-20 DIAGNOSIS — L89322 Pressure ulcer of left buttock, stage 2: Secondary | ICD-10-CM | POA: Diagnosis not present

## 2017-01-20 DIAGNOSIS — I2510 Atherosclerotic heart disease of native coronary artery without angina pectoris: Secondary | ICD-10-CM | POA: Diagnosis not present

## 2017-01-20 DIAGNOSIS — M109 Gout, unspecified: Secondary | ICD-10-CM | POA: Diagnosis not present

## 2017-01-20 DIAGNOSIS — L89312 Pressure ulcer of right buttock, stage 2: Secondary | ICD-10-CM | POA: Diagnosis not present

## 2017-01-20 DIAGNOSIS — I5022 Chronic systolic (congestive) heart failure: Secondary | ICD-10-CM | POA: Diagnosis not present

## 2017-01-20 DIAGNOSIS — M4854XD Collapsed vertebra, not elsewhere classified, thoracic region, subsequent encounter for fracture with routine healing: Secondary | ICD-10-CM | POA: Diagnosis not present

## 2017-01-20 DIAGNOSIS — E1140 Type 2 diabetes mellitus with diabetic neuropathy, unspecified: Secondary | ICD-10-CM | POA: Diagnosis not present

## 2017-01-20 DIAGNOSIS — I110 Hypertensive heart disease with heart failure: Secondary | ICD-10-CM | POA: Diagnosis not present

## 2017-01-20 DIAGNOSIS — M1990 Unspecified osteoarthritis, unspecified site: Secondary | ICD-10-CM | POA: Diagnosis not present

## 2017-01-21 ENCOUNTER — Other Ambulatory Visit: Payer: Self-pay | Admitting: *Deleted

## 2017-01-21 ENCOUNTER — Encounter: Payer: Self-pay | Admitting: *Deleted

## 2017-01-21 ENCOUNTER — Telehealth: Payer: Self-pay | Admitting: Pharmacist

## 2017-01-21 NOTE — Patient Outreach (Signed)
[facility]) Care Management  01/21/2017  Royelle Hinchman Hafen [birth year] [id number]   Referral from post acute care coordinator  Discharge from SNF on 8/17, Recent history of vertebral compression fracture ,s/p kyphoplasty.   Spoke with patient, explained reason for the call, patient able to recall visit from  [facility] representative Verdie Drown regarding [facility] services, patient agreeable to transition of call program. Patient reports she is doing pretty good , still a little slow., taking tylenol for relief of pain . Patient reports she lives in apartment alone but has as supportive daughter that assist with transportation , she has prepared some meals easy for patient to assess. Patient discussed not having a good appetite but she is drinking ensure at least once daily. Discussed benefit of [employer] meals, patient interested in receiving explained process.  Patient reports has a walker for use at home, [facility] home health RN visited on yesterday and physical therapy to visit in the next  2 days. Patient reports she is currently doing a sponge bath, but to begin with bath aide services with home health this week.  Patient reports she has all medications present, she states she doesn't take metformin on a regular basis,patient reports she manages her own medications. Patient was recently discharged from hospital and all medications have been reviewed.  Patient reports her most blood sugar reading was 100, she does not check it on a regular basis. Because it usually runs in a good range.  Patient denies shortness of breath, increased cough or sputum production , she uses oxygen at 2 liters nasal cannula, denied need for use of nebulizer treatment.  Patient denies dizziness reports she takes her time getting up and her blood pressure readings have okay from recent 2 home RN visit checks.    Patient discussed her daughter is planning to call for post discharge visit with PCP and will  provide transportation.    Plan  Will follow patient for transition of care program and weekly outreaches.  Scheduled home visit for the next week. Will send MD barrier letter. Will place call to [facility] regarding patient meal delivery.   [facility] CM Care Plan Problem One     Most Recent Value  Care Plan Problem One  Recent hospital admission and rehab staty related to vertebral compression fracture and kyphophasty   Role Documenting the Problem One  Care Management Coordinator  Care Plan for Problem One  Active  [facility] Long Term Goal   Patient will not experience a hospital admission in the next 31 days  [facility] Long Term Goal Start Date  01/21/17  Interventions for Problem One Long Term Goal  Advised regarding following taking medications as prescribed   [facility] CM Short Term Goal #1   Patient will attend all medical appointments in the next 30 days   [facility] CM Short Term Goal #1 Start Date  01/21/17  Interventions for Short Term Goal #1  Advised regarding importance of keeping all MD appointments, take medication to post discharge visit for review, verified transporation , daughter to call for appointment .   [facility] CM Short Term Goal #2   Patient will not experience a fall in the next 30 days   [facility] CM Short Term Goal #2 Start Date  01/21/17  Interventions for Short Term Goal #2  Advised regarding fall precautions, using walker at all times, standing a few seconds before starting to walk       Egbert Garibaldi, RN, [facility] Care  Management,Care Management Coordinator  [phone]- Mobile [phone]- Toll Free Main Office

## 2017-01-21 NOTE — Patient Outreach (Signed)
[facility]) Care Management  01/21/2017  [name] [birth year] [id number]  76 year old female referred to [facility] Care Management by [facility] post-acute care coordinator Verdie Drown.  [facility] services requested for medication education and medication mangement.  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  Patient recently hospitalized 7/29 - 01/03/17 for back pain and found to have T12 vertebral compression fracture s/p kyphoplasty and discharged to SNF.  Noted patient had issues with orthostatic hypotension at SNF. Patient discharged from SNF back home with [facility] home care on 01/17/17.      Subjective: Successful telephone call with patient today. HIPAA identifiers verified. Patient reports she has all her medications and is taking them appropriately.  She denies any financial difficulty affording medications.  Patient uses [employer] mail order for her chronic medications and [facility] for new or short -term medications.  Patient states she is familiar with monitoring herself for signs and symptoms of bleeding while on warfarin.    Objective: Medications Reviewed Today    Reviewed by Wynonia Hazard, RPH (Pharmacist) on 01/21/17 at 1420  Med List Status: <None>  Medication Order Taking? Sig Documenting Provider Last Dose Status Informant  alendronate (FOSAMAX) 70 MG tablet [id number] Yes Take 70 mg by mouth every Sunday.  [provider] Taking Active Self  allopurinol (ZYLOPRIM) 100 MG tablet [id number] Yes Take 100 mg by mouth 2 (two) times daily.  [provider] Taking Active Self  aspirin EC 81 MG tablet [id number] Yes Take 81 mg by mouth daily. [provider] Taking Active Self  cholecalciferol (VITAMIN D) 1000 UNITS tablet [id number] Yes Take 1,000 Units by mouth  daily. [provider] Taking Active Self  folic acid (FOLVITE) 800 MCG tablet [id number] Yes Take 800 mcg by mouth daily.  [provider] Taking Active Self  furosemide (LASIX) 40 MG tablet [id number] Yes Take 1 tablet (40 mg total) by mouth daily. Kathleene Hazel, MD Taking Active Self  gabapentin (NEURONTIN) 300 MG capsule [id number] Yes Take 300 mg by mouth 2 (two) times daily.   [provider] Taking Active Self  ipratropium-albuterol (DUONEB) 0.5-2.5 (3) MG/3ML SOLN [id number] Yes Take 3 mLs by nebulization every 6 (six) hours as needed. Merwyn Katos, MD Taking Active Self  metFORMIN (GLUCOPHAGE-XR) 500 MG 24 hr tablet [id number] Yes Take 500 mg by mouth daily. [provider] Taking Active Self           Med Note Kandra Nicolas, Melonie [place] Jan 21, 2017  2:17 PM)         Patient not taking:       Discontinued 01/21/17 1418 (Completed Course)   methotrexate (RHEUMATREX) 2.5 MG tablet [id number] Yes Take 20 mg by mouth every Monday.  [provider] Taking Active Self           Med Note Alben Spittle, Danella Maiers   Wed May 25, 2015  8:41 AM)          Discontinued 01/21/17 1418 (Entry Error)   metoprolol tartrate (LOPRESSOR) 25 MG tablet [id number] Yes Take 25 mg by mouth 2 (two) times daily. [provider] Taking Active   omeprazole (PRILOSEC) 20 MG capsule [id number] Yes Take 20 mg by mouth every morning.   [provider] Taking Active Self  Patient not taking:       Discontinued 01/21/17 1419 (Side effect (s))   Respiratory Therapy Supplies (FLUTTER) DEVI [id number]  Use 10-15 times daily Merwyn Katos, MD  Active Self  simvastatin (ZOCOR) 20 MG tablet [id number] Yes Take 20 mg by mouth every evening.  [provider] Taking Active Self           Med Note Alben Spittle, ASHLEY M   Wed May 25, 2015  8:41 AM)    warfarin (COUMADIN) 2 MG tablet [id number] Yes Take as directed by Coumadin Clinic  Patient taking differently:   Take 2-4 mg by mouth See admin instructions. Take 4mg  Monday- Saturday, and on Sunday take 2mg    Monday, MD Taking Active Self         Assessment: Medication review performed telephonically.   Drugs sorted by system:  Neurologic/Psychologic: Gabapentin  Cardiovascular: Aspirin 81mg , metoprolol, simvastatin, furosemide  Pulmonary/Allergy: Ipratropium-albuterol  Gastrointestinal: Omeprazole  Endocrine: Metformin  Vitamins/Minerals: Cholecalciferol, folic acid  Miscellaneous: Alendronate, allopurinol, methotrexate, warfarin  Other issues noted: I removed oxycodone and methocarbamol from patient's medication list as patient states she is not taking these.      Patient does not have any questions or concerns at this time with her medications. No major interactions or issues noted.  She is aware she can contact me at any time in the future for medication assistance.   Plan: [facility] pharmacy will close patient's case at this time.  I am happy to help in the future as needed. I will route my note to Dr. Kathleene Hazel and alert [facility] LCSW and RN regarding pharmacy case closure.   [facility], PharmD, [facility] Clinical Pharmacist [facility] [phone]

## 2017-01-22 ENCOUNTER — Other Ambulatory Visit: Payer: Self-pay | Admitting: *Deleted

## 2017-01-22 NOTE — Patient Outreach (Signed)
[facility]) Care Management  01/22/2017  [name] 1941-02-22 [id number]   CSW was able to make initial contact with patient today to perform phone assessment, as well as assess and assist with social work needs and services.  CSW introduced self, explained role and types of services provided through [facility] Care Management [facility] Care Management).  CSW further explained to patient that CSW works with patient's RNCM, also with [facility] Care Management, . CSW then explained the reason for the call, indicating that patient's RNCM thought that patient would benefit from social work services and resources to assist with  Transportation and community resources. CSW obtained two HIPAA compliant identifiers from patient, which included patient's name and date of birth. Patient agrees to a home visit and have planned visit for 01/30/17 with [facility] RNCM.   Reece Levy, MSW, LCSW Clinical Social Worker  [facility] [employer] [phone]

## 2017-01-23 DIAGNOSIS — I10 Essential (primary) hypertension: Secondary | ICD-10-CM | POA: Diagnosis not present

## 2017-01-23 DIAGNOSIS — M069 Rheumatoid arthritis, unspecified: Secondary | ICD-10-CM | POA: Diagnosis not present

## 2017-01-23 DIAGNOSIS — M810 Age-related osteoporosis without current pathological fracture: Secondary | ICD-10-CM | POA: Diagnosis not present

## 2017-01-23 DIAGNOSIS — S22000A Wedge compression fracture of unspecified thoracic vertebra, initial encounter for closed fracture: Secondary | ICD-10-CM | POA: Diagnosis not present

## 2017-01-23 DIAGNOSIS — J449 Chronic obstructive pulmonary disease, unspecified: Secondary | ICD-10-CM | POA: Diagnosis not present

## 2017-01-23 DIAGNOSIS — Z86711 Personal history of pulmonary embolism: Secondary | ICD-10-CM | POA: Diagnosis not present

## 2017-01-24 ENCOUNTER — Ambulatory Visit (INDEPENDENT_AMBULATORY_CARE_PROVIDER_SITE_OTHER): Payer: Medicare HMO | Admitting: Internal Medicine

## 2017-01-24 ENCOUNTER — Telehealth: Payer: Self-pay | Admitting: Cardiovascular Disease

## 2017-01-24 ENCOUNTER — Other Ambulatory Visit: Payer: Self-pay | Admitting: *Deleted

## 2017-01-24 DIAGNOSIS — L89312 Pressure ulcer of right buttock, stage 2: Secondary | ICD-10-CM | POA: Diagnosis not present

## 2017-01-24 DIAGNOSIS — M1990 Unspecified osteoarthritis, unspecified site: Secondary | ICD-10-CM | POA: Diagnosis not present

## 2017-01-24 DIAGNOSIS — M109 Gout, unspecified: Secondary | ICD-10-CM | POA: Diagnosis not present

## 2017-01-24 DIAGNOSIS — I2510 Atherosclerotic heart disease of native coronary artery without angina pectoris: Secondary | ICD-10-CM | POA: Diagnosis not present

## 2017-01-24 DIAGNOSIS — I5022 Chronic systolic (congestive) heart failure: Secondary | ICD-10-CM | POA: Diagnosis not present

## 2017-01-24 DIAGNOSIS — M4854XD Collapsed vertebra, not elsewhere classified, thoracic region, subsequent encounter for fracture with routine healing: Secondary | ICD-10-CM | POA: Diagnosis not present

## 2017-01-24 DIAGNOSIS — L89322 Pressure ulcer of left buttock, stage 2: Secondary | ICD-10-CM | POA: Diagnosis not present

## 2017-01-24 DIAGNOSIS — Z5181 Encounter for therapeutic drug level monitoring: Secondary | ICD-10-CM

## 2017-01-24 DIAGNOSIS — I110 Hypertensive heart disease with heart failure: Secondary | ICD-10-CM | POA: Diagnosis not present

## 2017-01-24 DIAGNOSIS — E1140 Type 2 diabetes mellitus with diabetic neuropathy, unspecified: Secondary | ICD-10-CM | POA: Diagnosis not present

## 2017-01-24 LAB — POCT INR: INR: 3

## 2017-01-24 NOTE — Patient Outreach (Signed)
[facility]) Care Management  01/24/2017  Shalan Neault Franzel 14-Nov-1940 [id number]  Late entry  Placed call to notify patient , [employer] well dine meals program , has been notified, on 8/21  and anticipate meal delivery on 8/24 to her home by 01/25/17. Well dine was notified of patient allergy to shellfish,   Egbert Garibaldi, RN, [facility] Care Management,Care Management Coordinator  [phone]- Mobile [phone]- Toll Free Main Office

## 2017-01-24 NOTE — Telephone Encounter (Signed)
New Message  Ann from [facility] call to report PT INR  Coumadin 3.0 PT 36.4  Call back to discuss if needed. bs

## 2017-01-24 NOTE — Telephone Encounter (Signed)
Returned a call to [facility] RN & the number that was taken down was incorrect. Called [facility] & after being transferred multiple times spoke with [facility] & she took orders on Anticoagulation Track.

## 2017-01-25 ENCOUNTER — Encounter: Payer: Self-pay | Admitting: Pharmacist

## 2017-01-25 DIAGNOSIS — I5022 Chronic systolic (congestive) heart failure: Secondary | ICD-10-CM | POA: Diagnosis not present

## 2017-01-25 DIAGNOSIS — M1990 Unspecified osteoarthritis, unspecified site: Secondary | ICD-10-CM | POA: Diagnosis not present

## 2017-01-25 DIAGNOSIS — L89312 Pressure ulcer of right buttock, stage 2: Secondary | ICD-10-CM | POA: Diagnosis not present

## 2017-01-25 DIAGNOSIS — E1140 Type 2 diabetes mellitus with diabetic neuropathy, unspecified: Secondary | ICD-10-CM | POA: Diagnosis not present

## 2017-01-25 DIAGNOSIS — L89322 Pressure ulcer of left buttock, stage 2: Secondary | ICD-10-CM | POA: Diagnosis not present

## 2017-01-25 DIAGNOSIS — M4854XD Collapsed vertebra, not elsewhere classified, thoracic region, subsequent encounter for fracture with routine healing: Secondary | ICD-10-CM | POA: Diagnosis not present

## 2017-01-25 DIAGNOSIS — I110 Hypertensive heart disease with heart failure: Secondary | ICD-10-CM | POA: Diagnosis not present

## 2017-01-25 DIAGNOSIS — I2510 Atherosclerotic heart disease of native coronary artery without angina pectoris: Secondary | ICD-10-CM | POA: Diagnosis not present

## 2017-01-25 DIAGNOSIS — M109 Gout, unspecified: Secondary | ICD-10-CM | POA: Diagnosis not present

## 2017-01-29 DIAGNOSIS — E1140 Type 2 diabetes mellitus with diabetic neuropathy, unspecified: Secondary | ICD-10-CM | POA: Diagnosis not present

## 2017-01-29 DIAGNOSIS — M109 Gout, unspecified: Secondary | ICD-10-CM | POA: Diagnosis not present

## 2017-01-29 DIAGNOSIS — L89312 Pressure ulcer of right buttock, stage 2: Secondary | ICD-10-CM | POA: Diagnosis not present

## 2017-01-29 DIAGNOSIS — M4854XD Collapsed vertebra, not elsewhere classified, thoracic region, subsequent encounter for fracture with routine healing: Secondary | ICD-10-CM | POA: Diagnosis not present

## 2017-01-29 DIAGNOSIS — L89322 Pressure ulcer of left buttock, stage 2: Secondary | ICD-10-CM | POA: Diagnosis not present

## 2017-01-29 DIAGNOSIS — I110 Hypertensive heart disease with heart failure: Secondary | ICD-10-CM | POA: Diagnosis not present

## 2017-01-29 DIAGNOSIS — M1990 Unspecified osteoarthritis, unspecified site: Secondary | ICD-10-CM | POA: Diagnosis not present

## 2017-01-29 DIAGNOSIS — I2510 Atherosclerotic heart disease of native coronary artery without angina pectoris: Secondary | ICD-10-CM | POA: Diagnosis not present

## 2017-01-29 DIAGNOSIS — I5022 Chronic systolic (congestive) heart failure: Secondary | ICD-10-CM | POA: Diagnosis not present

## 2017-01-30 ENCOUNTER — Encounter: Payer: Self-pay | Admitting: *Deleted

## 2017-01-30 ENCOUNTER — Other Ambulatory Visit: Payer: Self-pay | Admitting: *Deleted

## 2017-01-30 ENCOUNTER — Telehealth: Payer: Self-pay

## 2017-01-30 DIAGNOSIS — I110 Hypertensive heart disease with heart failure: Secondary | ICD-10-CM | POA: Diagnosis not present

## 2017-01-30 DIAGNOSIS — I5022 Chronic systolic (congestive) heart failure: Secondary | ICD-10-CM | POA: Diagnosis not present

## 2017-01-30 DIAGNOSIS — M4854XD Collapsed vertebra, not elsewhere classified, thoracic region, subsequent encounter for fracture with routine healing: Secondary | ICD-10-CM | POA: Diagnosis not present

## 2017-01-30 DIAGNOSIS — L89322 Pressure ulcer of left buttock, stage 2: Secondary | ICD-10-CM | POA: Diagnosis not present

## 2017-01-30 DIAGNOSIS — L89312 Pressure ulcer of right buttock, stage 2: Secondary | ICD-10-CM | POA: Diagnosis not present

## 2017-01-30 DIAGNOSIS — M1990 Unspecified osteoarthritis, unspecified site: Secondary | ICD-10-CM | POA: Diagnosis not present

## 2017-01-30 DIAGNOSIS — I2510 Atherosclerotic heart disease of native coronary artery without angina pectoris: Secondary | ICD-10-CM | POA: Diagnosis not present

## 2017-01-30 DIAGNOSIS — E1140 Type 2 diabetes mellitus with diabetic neuropathy, unspecified: Secondary | ICD-10-CM | POA: Diagnosis not present

## 2017-01-30 DIAGNOSIS — M109 Gout, unspecified: Secondary | ICD-10-CM | POA: Diagnosis not present

## 2017-01-30 LAB — PROTIME-INR: INR: 5.2 — AB (ref 0.9–1.1)

## 2017-01-30 NOTE — Patient Outreach (Signed)
[name]) Care Management  [facility] Social Work  01/30/2017  [name] 30-Nov-1940 [id number]  Subjective:  Patient released from SNF to home and can benefit from community resources.  Objective: CSW to assist patient with commuity based resources to aide in her well-being, quality of life and overall safety/needs.    Encounter Medications:  Outpatient Encounter Prescriptions as of 01/30/2017  Medication Sig Note  . alendronate (FOSAMAX) 70 MG tablet Take 70 mg by mouth every Sunday.    Marland Kitchen allopurinol (ZYLOPRIM) 100 MG tablet Take 100 mg by mouth 2 (two) times daily.    Marland Kitchen aspirin EC 81 MG tablet Take 81 mg by mouth daily.   . cholecalciferol (VITAMIN D) 1000 UNITS tablet Take 1,000 Units by mouth daily.   . folic acid (FOLVITE) 440 MCG tablet Take 800 mcg by mouth daily.    . furosemide (LASIX) 40 MG tablet Take 1 tablet (40 mg total) by mouth daily.   Marland Kitchen gabapentin (NEURONTIN) 300 MG capsule Take 300 mg by mouth 2 (two) times daily.     Marland Kitchen ipratropium-albuterol (DUONEB) 0.5-2.5 (3) MG/3ML SOLN Take 3 mLs by nebulization every 6 (six) hours as needed. 01/30/2017: Has on hand if needed  . metFORMIN (GLUCOPHAGE-XR) 500 MG 24 hr tablet Take 500 mg by mouth daily.   . methotrexate (RHEUMATREX) 2.5 MG tablet Take 20 mg by mouth every Monday.    . metoprolol tartrate (LOPRESSOR) 25 MG tablet Take 25 mg by mouth 2 (two) times daily.   Marland Kitchen omeprazole (PRILOSEC) 20 MG capsule Take 20 mg by mouth every morning.     Marland Kitchen Respiratory Therapy Supplies (FLUTTER) DEVI Use 10-15 times daily   . simvastatin (ZOCOR) 20 MG tablet Take 20 mg by mouth every evening.    . warfarin (COUMADIN) 2 MG tablet Take as directed by Coumadin Clinic (Patient taking differently: Take 2-4 mg by mouth See admin instructions. Take 70m Monday- Saturday, and on Sunday take 252m    No facility-administered encounter medications on file as of 01/30/2017.     Functional Status:  In your present state of  health, do you have any difficulty performing the following activities: 01/30/2017 12/30/2016  Hearing? N N  Vision? Y N  Difficulty concentrating or making decisions? Y N  Walking or climbing stairs? Y N  Dressing or bathing? Y N  Doing errands, shopping? Y N  Preparing Food and eating ? N -  Using the Toilet? N -  In the past six months, have you accidently leaked urine? N -  Do you have problems with loss of bowel control? N -  Managing your Medications? N -  Managing your Finances? N -  Housekeeping or managing your Housekeeping? Y -  Some recent data might be hidden    Fall/Depression Screening:  PHQ 2/9 Scores 01/30/2017  PHQ - 2 Score 2  PHQ- 9 Score 6  Exception Documentation Patient refusal    Assessment:  CSW [facility] with [name]. Patient reports recent hospitalization and SNF rehab stay. She lives alone and can benefit from community resource/services to improve her quality of life, safety and opportunity.    [facility] Care Plan Problem One     Most Recent Value  Care Plan Problem One  Patient with limited support   Role Documenting the Problem One  Clinical Social Worker  Care Plan for Problem One  Active  THN Long Term Goal      Interventions for  Problem One Long Term Goal     TH[facility] CM Short Term Goal #1   Patient will be linked with communty resource/services to review and consider in the next 30 days.  THN CM Short Term Goal #1 Start Date  01/30/17  Interventions for Short Term Goal #1  CSW discussed possible services/programs and will mail patient info to review.  THN CM Short Term Goal #2      Interventions for Short Term Goal #2     THN CM Short Term Goal #3     Interventions for Short Tern Goal #3     THN CM Short Term Goal #4     Interventions for Short Term Goal #4          Plan:  CSW will link patient with resources including: [employer] transportation, life alert, medicaid eligibility, senior center/activities, etc.   Plan f/u call in  1-2 weeks.    Eduard Clos, MSW, [place] Worker  [place] [phone]

## 2017-01-30 NOTE — Telephone Encounter (Signed)
Received call from [facility] nurse that she was able to get pt's labs drawn for INR, but she is having to take the specimen to [facility], as [facility] is closed already. She wanted to make [place] aware that these results will not be available today for dosing. Advised her to have pt take 2 mg tonight as scheduled and to call back tomorrow w/ INR results and further instructions. She also wanted to make [place] aware that pt has pressure wounds on her backside that are now open (but not bleeding) and are very painful. She verbalizes that she is aware that our office does not handle this aspect of pt care, but she wanted to make [place] aware.

## 2017-01-30 NOTE — Patient Outreach (Signed)
[facility]) Care Management   01/30/2017  [name] May 23, 1941 [id number]  Erik Nessel Thone is an 76 y.o. female  Subjective:  Patient reports she is feeling a little stronger, but discussed limitations  in movements after kyphoplasty in recovering .  Patient discussed not using her chest percussion vest or nebulizer treatment since discharged home from rehab reports her cough is about the same, she states just taking it easy as she is unsure cause of her fracture because she did not have a fall before hand. Patient discussed having a fall during her rehab stay.     Objective:  BP 110/60 (BP Location: Left Arm, Patient Position: Sitting, Cuff Size: Normal)   Pulse 100   Resp 18   SpO2 93%  Sitting in lift chair, walker at chairside. Review of Systems  Constitutional: Negative.   HENT: Negative.   Eyes: Negative.   Respiratory: Positive for cough.        Coarse sounding cough, rare productive sputum.  Cardiovascular: Positive for leg swelling.       Lower leg swelling , left leg greater than right   Genitourinary: Negative.   Musculoskeletal: Positive for falls.  Skin: Negative.   Neurological: Positive for dizziness.  Endo/Heme/Allergies: Bruises/bleeds easily.  Psychiatric/Behavioral: Negative.     Physical Exam  Constitutional: She appears well-developed and well-nourished.  Skin: Skin is warm, dry and intact.       Encounter Medications:   Outpatient Encounter Prescriptions as of 01/30/2017  Medication Sig Note  . alendronate (FOSAMAX) 70 MG tablet Take 70 mg by mouth every Sunday.    Marland Kitchen allopurinol (ZYLOPRIM) 100 MG tablet Take 100 mg by mouth 2 (two) times daily.    Marland Kitchen aspirin EC 81 MG tablet Take 81 mg by mouth daily.   . cholecalciferol (VITAMIN D) 1000 UNITS tablet Take 1,000 Units by mouth daily.   . folic acid (FOLVITE) 800 MCG tablet Take 800 mcg by mouth daily.    . furosemide (LASIX) 40 MG tablet Take 1 tablet (40 mg total) by mouth  daily.   Marland Kitchen gabapentin (NEURONTIN) 300 MG capsule Take 300 mg by mouth 2 (two) times daily.     Marland Kitchen ipratropium-albuterol (DUONEB) 0.5-2.5 (3) MG/3ML SOLN Take 3 mLs by nebulization every 6 (six) hours as needed. 01/30/2017: Not using on regular basis   . metFORMIN (GLUCOPHAGE-XR) 500 MG 24 hr tablet Take 500 mg by mouth daily.   . methotrexate (RHEUMATREX) 2.5 MG tablet Take 20 mg by mouth every Monday.    . metoprolol tartrate (LOPRESSOR) 25 MG tablet Take 25 mg by mouth 2 (two) times daily.   Marland Kitchen omeprazole (PRILOSEC) 20 MG capsule Take 20 mg by mouth every morning.     Marland Kitchen Respiratory Therapy Supplies (FLUTTER) DEVI Use 10-15 times daily   . simvastatin (ZOCOR) 20 MG tablet Take 20 mg by mouth every evening.    . warfarin (COUMADIN) 2 MG tablet Take as directed by Coumadin Clinic (Patient taking differently: Take 2-4 mg by mouth See admin instructions. Take 4mg  Monday- Saturday, and on Sunday take 2mg )   . acetaminophen (TYLENOL) 500 MG tablet Take 500 mg by mouth every 6 (six) hours as needed.    No facility-administered encounter medications on file as of 01/30/2017.   Patient was recently discharged from hospital and all medications have been reviewed.  Functional Status:   In your present state of health, do you have any difficulty performing the following activities: 01/30/2017 12/30/2016  Hearing?  N N  Vision? Y N  Difficulty concentrating or making decisions? Y N  Walking or climbing stairs? Y N  Dressing or bathing? Y N  Doing errands, shopping? Y N  Preparing Food and eating ? N -  Using the Toilet? N -  In the past six months, have you accidently leaked urine? N -  Do you have problems with loss of bowel control? N -  Managing your Medications? N -  Managing your Finances? N -  Housekeeping or managing your Housekeeping? Y -  Some recent data might be hidden    Fall/Depression Screening:    Fall Risk  01/30/2017  Falls in the past year? Yes  Number falls in past yr: 1  Injury  with Fall? No  Risk for fall due to : History of fall(s)  Follow up Falls evaluation completed;Education provided;Falls prevention discussed   PHQ 2/9 Scores 01/30/2017  PHQ - 2 Score 2  PHQ- 9 Score 6  Exception Documentation Patient refusal    Assessment:  Transition of care initial home co-visit with Reece Levy, LCSW. [facility] RN and PT, OT to begin . Patient anticipating home health RN visit today to check INR.   1.Reviewed [facility] transition of care program. Patient has new patient packet she received while in rehab. She denies needed changes to consent.  2.Recent Kyphoplasty - tolerating increased activity in home,adhering to post kyphoplasty movement limitation on bending, twisting movements, participating with home physical therapy, pain controlled with prn tylenol  3.High Fall Risk- additional fall prevention measures identified, family to assist with hand held shower , Home health occupational therapy services to begin  4.. Diabetes - monitoring blood sugars twice weekly, reading in 100 range. Need review of Diabetes low blood management .  5. History of COPD,bronchiectasis - congested sounding cough unchanged per report, patient not using nebulizer treatment, flutter valve or doing chest percussion vest  since procedure. Patient wearing oxygen at 2 liters.  Will make MD aware.  6. Decreased appetite - supplementing diet with ensure,patient has [employer] well dine meals and daughter assist with meal preparation.      Plan:  1. Will continue weekly transition of care outreaches,next call in a week. Reviewed [facility] welcome packet. 2. Reinforced continued participation in therapy. 3. Continued fall risk education, provided EMMI on fall prevention, LCSW to provide , resource information on medical alert.  4. Review of Diabetes low blood sugar symptoms and treatment.EMMI education sheet on controlling diabetes  5. Education on benefits of flutter valve,  nebulizer treatment , will notify MD of patient concerns related to using vest after surgery.  6. Discussed education on balanced diet, small meals to support diet.   Will send PCP visit note. Care planning and goal setting at visit.   [facility] CM Care Plan Problem One     Most Recent Value  Care Plan Problem One  Recent hospital admission and rehab staty related to vertebral compression fracture and kyphophasty   Role Documenting the Problem One  Care Management Coordinator  Care Plan for Problem One  Active  [facility] Long Term Goal   Patient will not experience a hospital admission in the next 31 days  [facility] Long Term Goal Start Date  01/21/17  Interventions for Problem One Long Term Goal  Advised regarding following taking medications as prescribed   [facility] CM Short Term Goal #1   Patient will attend all medical appointments in the next 30 days   [facility]  CM Short Term Goal #1 Start Date  01/21/17  Interventions for Short Term Goal #1  Reinforced regarding importance of keeping all MD appointments, take medication to post discharge visit for review, verified transporation , daughter to call for appointment .   [facility] CM Short Term Goal #2   Patient will not experience a fall in the next 30 days   [facility] CM Short Term Goal #2 Start Date  01/21/17  Interventions for Short Term Goal #2  fall safety evaluation, reviewed EMMI on preventing fall, discussed measures of using walker, oxygen tubing awareness when walking, keeping phone nearby at all time s   [facility] CM Short Term Goal #3  Patient will report using flutter valve at least 10 times daily in the next 30 days   [facility] CM Short Term Goal #3 Start Date  01/30/17  Interventions for Short Tern Goal #3  Educated on benefits of using medical devices as prescribed to help with chronic condition , veriifed patient has device at home and how to use   [facility] CM Short Term Goal #4  Patient will be able to report symptoms of low blood sugar and treament in the next 30 days   [facility] CM  Short Term Goal #4 Start Date  01/30/17  Interventions for Short Term Goal #4  Educated on low blood sugar symptoms, action of  checking blood sugar, treatment for blood sugars less than 70 snack options for treatment .       Egbert Garibaldi, RN, [facility] Care Management,Care Management Coordinator  [phone]- Mobile [phone]- Toll Free Main Office

## 2017-01-31 ENCOUNTER — Other Ambulatory Visit: Payer: Self-pay | Admitting: *Deleted

## 2017-01-31 ENCOUNTER — Telehealth: Payer: Self-pay | Admitting: Pulmonary Disease

## 2017-01-31 ENCOUNTER — Ambulatory Visit (INDEPENDENT_AMBULATORY_CARE_PROVIDER_SITE_OTHER): Payer: Medicare HMO | Admitting: Cardiology

## 2017-01-31 DIAGNOSIS — E1140 Type 2 diabetes mellitus with diabetic neuropathy, unspecified: Secondary | ICD-10-CM | POA: Diagnosis not present

## 2017-01-31 DIAGNOSIS — I110 Hypertensive heart disease with heart failure: Secondary | ICD-10-CM | POA: Diagnosis not present

## 2017-01-31 DIAGNOSIS — M109 Gout, unspecified: Secondary | ICD-10-CM | POA: Diagnosis not present

## 2017-01-31 DIAGNOSIS — I2510 Atherosclerotic heart disease of native coronary artery without angina pectoris: Secondary | ICD-10-CM

## 2017-01-31 DIAGNOSIS — M1990 Unspecified osteoarthritis, unspecified site: Secondary | ICD-10-CM | POA: Diagnosis not present

## 2017-01-31 DIAGNOSIS — I5022 Chronic systolic (congestive) heart failure: Secondary | ICD-10-CM | POA: Diagnosis not present

## 2017-01-31 DIAGNOSIS — Z5181 Encounter for therapeutic drug level monitoring: Secondary | ICD-10-CM

## 2017-01-31 DIAGNOSIS — L89312 Pressure ulcer of right buttock, stage 2: Secondary | ICD-10-CM | POA: Diagnosis not present

## 2017-01-31 DIAGNOSIS — M4854XD Collapsed vertebra, not elsewhere classified, thoracic region, subsequent encounter for fracture with routine healing: Secondary | ICD-10-CM | POA: Diagnosis not present

## 2017-01-31 DIAGNOSIS — L89322 Pressure ulcer of left buttock, stage 2: Secondary | ICD-10-CM | POA: Diagnosis not present

## 2017-01-31 NOTE — Telephone Encounter (Signed)
Please advise on message below.

## 2017-01-31 NOTE — Patient Outreach (Signed)
[facility]) Care Management  01/31/2017  [name] March 20, 1941 [id number]   Care Coordination   Call to Dr.Simmond, pulmonary , to notify of patient concern, and that she is   not using her chest percussion vest or nebulizer treatments  since discharge from rehab after kyphoplasty, she questioned if vest contributed to back problem. Patient reports her cough is about the same, denies increase in respiratory difficulty. I was able to leave nurse for nurse line regarding concern .    Egbert Garibaldi, RN, [facility] Care Management,Care Management Coordinator  [phone]- Mobile [phone]- Toll Free Main Office

## 2017-01-31 NOTE — Telephone Encounter (Signed)
[name] with [facility] calling stating they saw Pt yesterday  Pt  recently had surgery kyphoplasty , she is doing well from that States we ordered a Chest percussion vest along with breathing treatment for patient.  Pt is hesitant to using the vest. She is not sure if she needs to do it or not. Would like to know what to do

## 2017-02-01 ENCOUNTER — Other Ambulatory Visit: Payer: Self-pay | Admitting: *Deleted

## 2017-02-01 DIAGNOSIS — L89300 Pressure ulcer of unspecified buttock, unstageable: Secondary | ICD-10-CM | POA: Diagnosis not present

## 2017-02-01 NOTE — Telephone Encounter (Signed)
Spoke with Selena Batten and she will contact pt to cont vest, flutter valve and nebs since pt was hesitant about resuming them since surgery. Nothing further needed.

## 2017-02-01 NOTE — Patient Outreach (Signed)
[facility]) Care Management  02/01/2017  [name] March 14, 1941 [id number]   Incoming call from [facility] from Dr.Simmonds to inform regarding recommendations for patient to start using vest 2 to 3 weeks after kyphoplasty, and continue using flutter valve and nebulizer treatments.  Placed call to patient to inform of MD  recommendation , discussed date of kyphoplasty being 8/1. Patient voiced understanding and states she will begin. Discussed with patient if experiences any new symptoms to notify MD.   Plan Plan to continue transition of care follow up in the next week.   Egbert Garibaldi, RN, [facility] Care Management,Care Management Coordinator  [phone]- Mobile [phone]- Toll Free Main Office

## 2017-02-01 NOTE — Telephone Encounter (Signed)
Wait for 2-3 weeks after kyphoplasty, then resume chest percussion as ordered  Billy Fischer, MD PCCM service Mobile [phone] Pager [phone] 02/01/2017 8:34 AM

## 2017-02-01 NOTE — Telephone Encounter (Signed)
LMOVM for [name] to return my call.

## 2017-02-02 DIAGNOSIS — J984 Other disorders of lung: Secondary | ICD-10-CM | POA: Diagnosis not present

## 2017-02-02 DIAGNOSIS — J479 Bronchiectasis, uncomplicated: Secondary | ICD-10-CM | POA: Diagnosis not present

## 2017-02-05 ENCOUNTER — Observation Stay (HOSPITAL_COMMUNITY)
Admission: EM | Admit: 2017-02-05 | Discharge: 2017-02-09 | Disposition: A | Discharge status: Home / Self Care | Payer: Medicare HMO | Attending: Internal Medicine | Admitting: Internal Medicine

## 2017-02-05 ENCOUNTER — Ambulatory Visit (INDEPENDENT_AMBULATORY_CARE_PROVIDER_SITE_OTHER): Payer: Medicare HMO

## 2017-02-05 ENCOUNTER — Emergency Department (HOSPITAL_COMMUNITY): Payer: Medicare HMO

## 2017-02-05 DIAGNOSIS — M549 Dorsalgia, unspecified: Secondary | ICD-10-CM | POA: Diagnosis not present

## 2017-02-05 DIAGNOSIS — Q240 Dextrocardia: Secondary | ICD-10-CM

## 2017-02-05 DIAGNOSIS — I2692 Saddle embolus of pulmonary artery without acute cor pulmonale: Secondary | ICD-10-CM | POA: Diagnosis not present

## 2017-02-05 DIAGNOSIS — M7989 Other specified soft tissue disorders: Secondary | ICD-10-CM | POA: Diagnosis not present

## 2017-02-05 DIAGNOSIS — L89322 Pressure ulcer of left buttock, stage 2: Secondary | ICD-10-CM | POA: Diagnosis not present

## 2017-02-05 DIAGNOSIS — I5022 Chronic systolic (congestive) heart failure: Secondary | ICD-10-CM | POA: Diagnosis not present

## 2017-02-05 DIAGNOSIS — M546 Pain in thoracic spine: Secondary | ICD-10-CM | POA: Diagnosis not present

## 2017-02-05 DIAGNOSIS — Z7984 Long term (current) use of oral hypoglycemic drugs: Secondary | ICD-10-CM | POA: Insufficient documentation

## 2017-02-05 DIAGNOSIS — I110 Hypertensive heart disease with heart failure: Secondary | ICD-10-CM | POA: Insufficient documentation

## 2017-02-05 DIAGNOSIS — M545 Low back pain: Secondary | ICD-10-CM | POA: Diagnosis present

## 2017-02-05 DIAGNOSIS — S32010A Wedge compression fracture of first lumbar vertebra, initial encounter for closed fracture: Secondary | ICD-10-CM

## 2017-02-05 DIAGNOSIS — Z7901 Long term (current) use of anticoagulants: Secondary | ICD-10-CM | POA: Diagnosis not present

## 2017-02-05 DIAGNOSIS — S32010D Wedge compression fracture of first lumbar vertebra, subsequent encounter for fracture with routine healing: Secondary | ICD-10-CM | POA: Diagnosis not present

## 2017-02-05 DIAGNOSIS — Q348 Other specified congenital malformations of respiratory system: Secondary | ICD-10-CM

## 2017-02-05 DIAGNOSIS — I129 Hypertensive chronic kidney disease with stage 1 through stage 4 chronic kidney disease, or unspecified chronic kidney disease: Secondary | ICD-10-CM | POA: Insufficient documentation

## 2017-02-05 DIAGNOSIS — I2510 Atherosclerotic heart disease of native coronary artery without angina pectoris: Secondary | ICD-10-CM | POA: Diagnosis present

## 2017-02-05 DIAGNOSIS — S32000A Wedge compression fracture of unspecified lumbar vertebra, initial encounter for closed fracture: Secondary | ICD-10-CM | POA: Diagnosis not present

## 2017-02-05 DIAGNOSIS — L89312 Pressure ulcer of right buttock, stage 2: Secondary | ICD-10-CM | POA: Diagnosis not present

## 2017-02-05 DIAGNOSIS — Q893 Situs inversus: Secondary | ICD-10-CM

## 2017-02-05 DIAGNOSIS — N189 Chronic kidney disease, unspecified: Secondary | ICD-10-CM | POA: Insufficient documentation

## 2017-02-05 DIAGNOSIS — R52 Pain, unspecified: Secondary | ICD-10-CM | POA: Diagnosis present

## 2017-02-05 DIAGNOSIS — X58XXXA Exposure to other specified factors, initial encounter: Secondary | ICD-10-CM | POA: Insufficient documentation

## 2017-02-05 DIAGNOSIS — I471 Supraventricular tachycardia, unspecified: Secondary | ICD-10-CM

## 2017-02-05 DIAGNOSIS — N183 Chronic kidney disease, stage 3 unspecified: Secondary | ICD-10-CM

## 2017-02-05 DIAGNOSIS — R110 Nausea: Secondary | ICD-10-CM | POA: Diagnosis not present

## 2017-02-05 DIAGNOSIS — E1122 Type 2 diabetes mellitus with diabetic chronic kidney disease: Secondary | ICD-10-CM | POA: Diagnosis not present

## 2017-02-05 DIAGNOSIS — M109 Gout, unspecified: Secondary | ICD-10-CM | POA: Diagnosis not present

## 2017-02-05 DIAGNOSIS — M4854XD Collapsed vertebra, not elsewhere classified, thoracic region, subsequent encounter for fracture with routine healing: Secondary | ICD-10-CM | POA: Diagnosis not present

## 2017-02-05 DIAGNOSIS — I252 Old myocardial infarction: Secondary | ICD-10-CM | POA: Insufficient documentation

## 2017-02-05 DIAGNOSIS — E1169 Type 2 diabetes mellitus with other specified complication: Secondary | ICD-10-CM

## 2017-02-05 DIAGNOSIS — M4850XA Collapsed vertebra, not elsewhere classified, site unspecified, initial encounter for fracture: Secondary | ICD-10-CM | POA: Diagnosis present

## 2017-02-05 DIAGNOSIS — J479 Bronchiectasis, uncomplicated: Secondary | ICD-10-CM

## 2017-02-05 DIAGNOSIS — M069 Rheumatoid arthritis, unspecified: Secondary | ICD-10-CM | POA: Diagnosis present

## 2017-02-05 DIAGNOSIS — M1990 Unspecified osteoarthritis, unspecified site: Secondary | ICD-10-CM | POA: Diagnosis not present

## 2017-02-05 DIAGNOSIS — L98422 Non-pressure chronic ulcer of back with fat layer exposed: Secondary | ICD-10-CM | POA: Diagnosis not present

## 2017-02-05 DIAGNOSIS — Z5181 Encounter for therapeutic drug level monitoring: Secondary | ICD-10-CM | POA: Diagnosis not present

## 2017-02-05 DIAGNOSIS — E1140 Type 2 diabetes mellitus with diabetic neuropathy, unspecified: Secondary | ICD-10-CM | POA: Diagnosis not present

## 2017-02-05 DIAGNOSIS — E119 Type 2 diabetes mellitus without complications: Secondary | ICD-10-CM

## 2017-02-05 DIAGNOSIS — L8990 Pressure ulcer of unspecified site, unspecified stage: Secondary | ICD-10-CM | POA: Diagnosis present

## 2017-02-05 HISTORY — DX: Other chronic pain: G89.29

## 2017-02-05 HISTORY — DX: Dorsalgia, unspecified: M54.9

## 2017-02-05 LAB — CBC
HEMATOCRIT: 37.9 % (ref 36.0–46.0)
HEMOGLOBIN: 12.1 g/dL (ref 12.0–15.0)
MCH: 32.4 pg (ref 26.0–34.0)
MCHC: 31.9 g/dL (ref 30.0–36.0)
MCV: 101.6 fL — ABNORMAL HIGH (ref 78.0–100.0)
Platelets: 402 10*3/uL — ABNORMAL HIGH (ref 150–400)
RBC: 3.73 MIL/uL — ABNORMAL LOW (ref 3.87–5.11)
RDW: 18 % — AB (ref 11.5–15.5)
WBC: 18.4 10*3/uL — AB (ref 4.0–10.5)

## 2017-02-05 LAB — PROTIME-INR
INR: 1.34
Prothrombin Time: 16.5 seconds — ABNORMAL HIGH (ref 11.4–15.2)

## 2017-02-05 LAB — BASIC METABOLIC PANEL
ANION GAP: 13 (ref 5–15)
BUN: 25 mg/dL — ABNORMAL HIGH (ref 6–20)
CALCIUM: 9.3 mg/dL (ref 8.9–10.3)
CO2: 28 mmol/L (ref 22–32)
Chloride: 97 mmol/L — ABNORMAL LOW (ref 101–111)
Creatinine, Ser: 1.23 mg/dL — ABNORMAL HIGH (ref 0.44–1.00)
GFR calc Af Amer: 49 mL/min — ABNORMAL LOW (ref >60)
GFR, EST NON AFRICAN AMERICAN: 42 mL/min — AB (ref >60)
Glucose, Bld: 145 mg/dL — ABNORMAL HIGH (ref 65–99)
POTASSIUM: 3.6 mmol/L (ref 3.5–5.1)
SODIUM: 138 mmol/L (ref 135–145)

## 2017-02-05 LAB — MAGNESIUM: MAGNESIUM: 1.6 mg/dL — AB (ref 1.7–2.4)

## 2017-02-05 LAB — POCT INR: INR: 1.3

## 2017-02-05 LAB — TROPONIN I

## 2017-02-05 LAB — BRAIN NATRIURETIC PEPTIDE: B NATRIURETIC PEPTIDE 5: 39.5 pg/mL (ref 0.0–100.0)

## 2017-02-05 MED ORDER — HYDROMORPHONE HCL 1 MG/ML IJ SOLN
0.5000 mg | Freq: Once | INTRAMUSCULAR | Status: AC
  Administered 2017-02-05: 0.5 mg via INTRAVENOUS
  Filled 2017-02-05: qty 1

## 2017-02-05 MED ORDER — HYDROMORPHONE HCL 1 MG/ML IJ SOLN
0.5000 mg | Freq: Once | INTRAMUSCULAR | Status: AC
  Administered 2017-02-06: 0.5 mg via INTRAVENOUS
  Filled 2017-02-05: qty 1

## 2017-02-05 MED ORDER — FENTANYL CITRATE (PF) 100 MCG/2ML IJ SOLN
50.0000 ug | Freq: Once | INTRAMUSCULAR | Status: AC
  Administered 2017-02-05: 50 ug via INTRAVENOUS
  Filled 2017-02-05: qty 2

## 2017-02-05 MED ORDER — IOPAMIDOL (ISOVUE-370) INJECTION 76%
INTRAVENOUS | Status: AC
  Administered 2017-02-05: 100 mL
  Filled 2017-02-05: qty 100

## 2017-02-05 NOTE — ED Notes (Signed)
Pt using an external female catheter. 

## 2017-02-05 NOTE — ED Notes (Signed)
Pt reports she does not have to urinate at this time

## 2017-02-05 NOTE — ED Notes (Signed)
Pt transported to XR. This RN unaware pt being transported

## 2017-02-05 NOTE — ED Notes (Signed)
Per main lab, will add on labs to existing specimens.

## 2017-02-05 NOTE — ED Notes (Signed)
ED Provider at bedside. 

## 2017-02-05 NOTE — ED Provider Notes (Signed)
[facility]-EMERGENCY DEPT Provider Note   CSN: [id number] Arrival date & time: 02/05/17  1644     History   Chief Complaint Chief Complaint  Patient presents with  . Back Pain    HPI [name] is a 76 y.o. female.  HPI Patient presents with mid back pain. Around one month ago had a kyphoplasty at T12. Had been doing well and has been in rehabilitation but around 2 days ago began to have more pain in her back. No fall or other trauma with that. It is in the same area she was having the pain with. She states she has more swelling or legs. 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 No chest pain but has more swelling on her legs. No real cough. No dysuria. States her Coumadin level has been low and they have been trying to adjust to. States she was started on doxycycline for some wounds on her buttocks. She has chronic oxygen at home. No loss of bladder or bowel control. Past Medical History:  Diagnosis Date  . Allergy   . Anemia   . Arthritis   . Blood transfusion without reported diagnosis   . CAD (coronary artery disease)    NSTEMI in setting of gallstone pancreatitis 05/2011:  LHC with 3v CAD; CABG was performed 06/14/11: RIMA-LAD, SVG-ramus, SVG-RCA  . Carotid stenosis    Dopplers 06/12/11: LICA 60-79%.  . Cataract   . CHF (congestive heart failure) (HCC)   . Chronic bronchitis   . Chronic kidney disease   . Chronic systolic heart failure (HCC)   . Collagen vascular disease (HCC)   . Dextrocardia   . Diabetes mellitus without complication (HCC)   . DVT of leg (deep venous thrombosis) (HCC) ~2006   left  . Fracture 05/24/2011   right; "did not have surgery"  . GERD (gastroesophageal reflux disease)   . H/O hiatal hernia   . HLD (hyperlipidemia)   . Hypertension   . Ischemic cardiomyopathy    Echocardiogram 06/05/11: EF 40-45%, anteroseptal hypokinesis, apical hypokinesis, mild LAE  . Myocardial infarction (HCC)   . Neuromuscular disorder (HCC)    . Neuropathy   . Osteoporosis   . Oxygen deficiency    2l  . Pneumonia 06/13/11   "couple times; long time ago"  . Pulmonary embolus [facility]) March 2013  . Rheumatoid arthritis (HCC)   . Situs inversus with dextrocardia    CT 06/2011: situs inversus totalis.  What would typically be called RCA arose from anterior sinus of Valsalva.  What would typically be called the left main arises from the posterior sinus of Valsalva and gives rise to a large first diagonal branch and diminutive circumflex    Patient Active Problem List   Diagnosis Date Noted  . Vertebral compression fracture (HCC) 12/30/2016  . CAP (community acquired pneumonia) 05/25/2015  . Encounter for therapeutic drug monitoring 08/05/2013  . 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 CAD (coronary artery disease) 09/11/2011  . 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 Acute respiratory failure with hypoxia (HCC) 08/12/2011  . Saddle embolism of pulmonary artery (HCC) 08/12/2011  . Pericardial effusion 08/12/2011  . Atrial mass 08/12/2011  . Dyspnea 08/07/2011  . CAD 07/10/2011  . Cough 07/10/2011  . DM2 (diabetes mellitus, type 2) (HCC) 07/10/2011  . HLD (hyperlipidemia) 07/10/2011  . Carotid stenosis   . Hypokalemia 06/09/2011  . Hypomagnesemia 06/09/2011  . Dextrocardia 06/09/2011  . Acute myocardial infarction, subendocardial infarction,  subsequent episode of care (HCC) 06/08/2011  . Chronic systolic heart failure (HCC) 06/08/2011  . Acute renal failure (HCC) 06/08/2011    Past Surgical History:  Procedure Laterality Date  . ARCH AORTOGRAM Right 06/11/2011   Procedure: ARCH AORTOGRAM;  Surgeon: Kathleene Hazel, MD;  Location: [facility] CATH LAB;  Service: Cardiovascular;  Laterality: Right;  . CARDIAC CATHETERIZATION  06/11/11  . CHOLECYSTECTOMY    . CORONARY ARTERY BYPASS GRAFT  06/14/2011   Procedure: CORONARY ARTERY BYPASS GRAFTING (CABG);  Surgeon: Alleen Borne, MD;  Location: [facility] OR;  Service: Open  Heart Surgery;  Laterality: N/A;  . ERCP  06/03/2011   Procedure: ENDOSCOPIC RETROGRADE CHOLANGIOPANCREATOGRAPHY (ERCP);  Surgeon: Petra Kuba, MD;  Location: [facility] OR;  Service: Endoscopy;  Laterality: N/A;  . KYPHOPLASTY N/A 01/02/2017   Procedure: KYPHOPLASTY L2;  Surgeon: Kennedy Bucker, MD;  Location: [facility] ORS;  Service: Orthopedics;  Laterality: N/A;  . LEFT HEART CATHETERIZATION WITH CORONARY ANGIOGRAM N/A 06/11/2011   Procedure: LEFT HEART CATHETERIZATION WITH CORONARY ANGIOGRAM;  Surgeon: Kathleene Hazel, MD;  Location: [facility] CATH LAB;  Service: Cardiovascular;  Laterality: N/A;  . VAGINAL HYSTERECTOMY  1970's    OB History    No data available       Home Medications    Prior to Admission medications   Medication Sig Start Date End Date Taking? Authorizing Provider  acetaminophen (TYLENOL) 160 MG/5ML elixir Take 320 mg by mouth every 6 (six) hours.   Yes [provider]  allopurinol (ZYLOPRIM) 100 MG tablet Take 200 mg by mouth daily.    Yes [provider]  aspirin EC 81 MG tablet Take 81 mg by mouth daily.   Yes [provider]  cholecalciferol (VITAMIN D) 1000 UNITS tablet Take 1,000 Units by mouth daily.   Yes [provider]  doxycycline (VIBRAMYCIN) 100 MG capsule Take 100 mg by mouth 2 (two) times daily. Has 4 days left 02/01/17  Yes [provider]  folic acid (FOLVITE) 800 MCG tablet Take 800 mcg by mouth daily.    Yes [provider]  furosemide (LASIX) 40 MG tablet Take 1 tablet (40 mg total) by mouth daily. 08/29/16 02/05/17 Yes Kathleene Hazel, MD  gabapentin (NEURONTIN) 300 MG capsule Take 300 mg by mouth 2 (two) times daily.     Yes [provider]  ipratropium-albuterol (DUONEB) 0.5-2.5 (3) MG/3ML SOLN Take 3 mLs by nebulization every 6 (six) hours as needed. Patient taking differently: Take 3 mLs by nebulization every 6 (six) hours as needed (wheezing/sob).  11/14/16  Yes Merwyn Katos, MD    metFORMIN (GLUCOPHAGE-XR) 500 MG 24 hr tablet Take 500 mg by mouth daily as needed (low blood sugar).  10/31/16  Yes [provider]  methotrexate (RHEUMATREX) 2.5 MG tablet Take 20 mg by mouth every Monday.    Yes [provider]  metoprolol tartrate (LOPRESSOR) 25 MG tablet Take 25 mg by mouth 2 (two) times daily.   Yes [provider]  omeprazole (PRILOSEC) 20 MG capsule Take 20 mg by mouth every morning.     Yes [provider]  simvastatin (ZOCOR) 20 MG tablet Take 20 mg by mouth every evening.    Yes [provider]  warfarin (COUMADIN) 2 MG tablet Take as directed by [facility] Patient taking differently: Take 2-4 mg by mouth See admin instructions. Take 2mg  on Sat/Sun and 4mg  on Mon-Fri. 10/17/16  Yes , MD  alendronate (FOSAMAX) 70 MG tablet  Take 70 mg by mouth every Sunday.     [provider]  Respiratory Therapy Supplies (FLUTTER) DEVI Use 10-15 times daily 09/10/16   Merwyn Katos, MD    Family History Family History  Problem Relation Age of Onset  . Heart disease Father   . Heart disease Sister     Social History Social History  Substance Use Topics  . Smoking status: Never Smoker  . Smokeless tobacco: Never Used  . Alcohol use No     Allergies   Penicillins; Shellfish allergy; Hydrocodone; Oxycodone; and Levaquin [levofloxacin]   Review of Systems Review of Systems  Constitutional: Negative for appetite change.  HENT: Negative for congestion.   Respiratory: Negative for cough.   Cardiovascular: Positive for leg swelling.  Gastrointestinal: Negative for abdominal pain.  Genitourinary: Negative for enuresis and flank pain.  Musculoskeletal: Positive for back pain.  Neurological: Positive for weakness.  Psychiatric/Behavioral: Negative for confusion.     Physical Exam Updated Vital Signs BP 117/85   Pulse (!) 113   Temp 97.9 F (36.6 C) (Oral)   Resp (!) 22   SpO2 99%    Physical Exam  Constitutional: She appears well-developed.  HENT:  Head: Normocephalic.  Eyes: Pupils are equal, round, and reactive to light.  Cardiovascular:  Irregular tachycardia  Pulmonary/Chest: She has no rales.  Abdominal: She exhibits no distension.  Musculoskeletal: She exhibits edema.  Moderate edema to bilateral lower extremities. Moderate to severe tenderness over lower thoracic or upper lumbar spine. Neurovascular grossly intact in bilateral lower extremities. Does have pain with straight leg raise worse on left than right.  Skin: Skin is warm.  Psychiatric: She has a normal mood and affect.     ED Treatments / Results  Labs (all labs ordered are listed, but only abnormal results are displayed) Labs Reviewed  BASIC METABOLIC PANEL - Abnormal; Notable for the following:       Result Value   Chloride 97 (*)    Glucose, Bld 145 (*)    BUN 25 (*)    Creatinine, Ser 1.23 (*)    GFR calc non Af Amer 42 (*)    GFR calc Af Amer 49 (*)    All other components within normal limits  CBC - Abnormal; Notable for the following:    WBC 18.4 (*)    RBC 3.73 (*)    MCV 101.6 (*)    RDW 18.0 (*)    Platelets 402 (*)    All other components within normal limits  MAGNESIUM - Abnormal; Notable for the following:    Magnesium 1.6 (*)    All other components within normal limits  PROTIME-INR - Abnormal; Notable for the following:    Prothrombin Time 16.5 (*)    All other components within normal limits  BRAIN NATRIURETIC PEPTIDE  TROPONIN I  URINALYSIS, ROUTINE W REFLEX MICROSCOPIC    EKG  EKG Interpretation  Date/Time:  Tuesday February 05 2017 18:47:21 EDT Ventricular Rate:  121 PR Interval:  150 QRS Duration: 98 QT Interval:  309 QTC Calculation: 439 R Axis:   -117 Text Interpretation:  Sinus or ectopic atrial tachycardia vs afib Multiform ventricular premature complexes Consider dextrocardia previous EKG had leads reversed for dextrocardia Confirmed by  Benjiman Core [id number]) on 02/05/2017 6:52:55 PM      ED ECG REPORT   Date: 02/05/2017  Rate: 211  Rhythm: supraventricular tachycardia (SVT)  QRS Axis: normal  Intervals: normal  ST/T Wave abnormalities: nonspecific ST/T changes  Conduction Disutrbances:none and nonspecific intraventricular conduction delay  Narrative Interpretation:   Old EKG Reviewed: changes noted      Radiology Dg Chest 2 View  Result Date: 02/05/2017 CLINICAL DATA:  Back pain EXAM: CHEST  2 VIEW COMPARISON:  10/26/2016 FINDINGS: Bilateral lower lobe scarring/ atelectasis. No pleural effusion or pneumothorax. The heart is normal in size. Situs inversus. Postsurgical changes related to prior CABG. Median sternotomy. Thoracolumbar spine is described on dedicated radiographs. IVC filter. IMPRESSION: Thoracolumbar spine is described on dedicated radiographs. No evidence of acute cardiopulmonary disease. Electronically Signed   By: Charline Bills M.D.   On: 02/05/2017 20:31   Dg Thoracic Spine 2 View  Result Date: 02/05/2017 CLINICAL DATA:  Back pain, recent vertebroplasty EXAM: THORACIC SPINE 2 VIEWS COMPARISON:  MRI thoracic spine dated 12/31/2016 FINDINGS: Prior vertebral augmentation at T12. Mild degenerative changes of the thoracic spine. New superior endplate compression fracture deformity at L1 with approximately 30% loss of height. No retropulsion. IMPRESSION: New superior endplate compression fracture deformity at L1 with approximately 30% loss of height. No retropulsion. Prior vertebral augmentation at T12. Electronically Signed   By: Charline Bills M.D.   On: 02/05/2017 20:30   Dg Lumbar Spine 2-3 Views  Result Date: 02/05/2017 CLINICAL DATA:  Severe back pain, recent vertebroplasty EXAM: LUMBAR SPINE - 2-3 VIEW COMPARISON:  None. FINDINGS: Prior vertebral augmentation at T12. New superior endplate compression fracture deformity at L1 with approximately 30% loss of height. No retropulsion. Mild  degenerative changes of the upper lumbar spine. IVC filter. IMPRESSION: New superior endplate compression fracture deformity at L1 with approximately 30% loss of height. No retropulsion. Electronically Signed   By: Charline Bills M.D.   On: 02/05/2017 20:32   Ct Angio Chest Pe W And/or Wo Contrast  Result Date: 02/05/2017 CLINICAL DATA:  Back pain with recent surgery EXAM: CT ANGIOGRAPHY CHEST WITH CONTRAST TECHNIQUE: Multidetector CT imaging of the chest was performed using the standard protocol during bolus administration of intravenous contrast. Multiplanar CT image reconstructions and MIPs were obtained to evaluate the vascular anatomy. CONTRAST:  100 mL Isovue 370 intravenous COMPARISON:  Radiograph 02/05/2017, CT chest 05/26/2013, CT 12/30/2016 FINDINGS: Cardiovascular: Satisfactory opacification of the pulmonary arteries to the segmental level. No evidence of pulmonary embolism. Atherosclerotic calcifications of the aorta. No aneurysmal dilatation. Right-sided aortic arch with mirror image branching of the great vessels. No dissection is seen. Dextrocardia. Coronary artery calcification. No large pericardial effusion. Mediastinum/Nodes: Midline trachea. No thyroid. No significantly enlarged lymph nodes. Distal hiatal hernia. Lungs/Pleura: Bronchiectasis in the bilateral lower lobes with bronchial thickening and consolidation. Scattered ground-glass foci in the left upper lobe with mild tree-in-bud nodularity within the bilateral upper lobes and left greater than right lower lobes. No pleural effusion. No pneumothorax. Upper Abdomen:  Situs in versus.  Pneumobilia re- demonstrated. Musculoskeletal: Sternotomy changes. No acute or suspicious bone lesion Review of the MIP images confirms the above findings. IMPRESSION: 1. Negative for acute pulmonary embolus or aortic dissection 2. 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 Dextrocardia. 3. Bilateral  lower lobe bronchiectasis with chronic areas of consolidation and bronchial thickening. Scattered ground-glass foci and tree-in-bud nodularity within the upper and lower lobes, compatible with respiratory infection, consider atypical organisms. 4. Situs  inversus.  Pneumobilia, postsurgical Aortic Atherosclerosis (ICD10-I70.0). Electronically Signed   By: Jasmine Pang M.D.   On: 02/05/2017 23:02    Procedures Procedures (including critical care time)  Medications Ordered in ED Medications  HYDROmorphone (DILAUDID) injection 0.5 mg (not administered)  fentaNYL (SUBLIMAZE) injection 50 mcg (50 mcg Intravenous Given 02/05/17 1854)  fentaNYL (SUBLIMAZE) injection 50 mcg (50 mcg Intravenous Given 02/05/17 2048)  HYDROmorphone (DILAUDID) injection 0.5 mg (0.5 mg Intravenous Given 02/05/17 2144)  iopamidol (ISOVUE-370) 76 % injection (100 mLs  Contrast Given 02/05/17 2152)     Initial Impression / Assessment and Plan / ED Course  I have reviewed the triage vital signs and the nursing notes.  Pertinent labs & imaging results that were available during my care of the patient were reviewed by me and considered in my medical decision making (see chart for details). Patient presents with back pain for last 2 days. Recent kyphoplasty. Tenderness in this area and has a new compression fracture L1 with the kyphoplasty T12. Will need pain control. However also has tachycardia. Has had both a sinus tachycardia and an SVT. Also had a mixture of sinus tachycardia and irregular tachycardia.. Is on Coumadin for DVT and PE. Subtherapeutic. CT angiography done to rule out PE. Showed possible atypical pneumonia. Will admit to internal medicine     Final Clinical Impressions(s) / ED Diagnoses   Final diagnoses:  Closed compression fracture of first lumbar vertebra, initial encounter [facility])  SVT (supraventricular tachycardia) (HCC)    New Prescriptions New Prescriptions   No medications on file     Benjiman Core, MD 02/05/17 2346

## 2017-02-05 NOTE — ED Notes (Signed)
Patient transported to CT 

## 2017-02-05 NOTE — ED Notes (Signed)
Unsuccessful attempt to start appropriate IV access for CT scan. Attempted with [place] machine unsuccessful. Will have another nurse attempt to try.

## 2017-02-05 NOTE — ED Triage Notes (Signed)
Pt reports back pain at the level of her waist. She reports she had a back surgery on 8/2. She denies urinary changes. Pt states she is ambulatory with her walker. Pt is poor historian and hard to gather information from.

## 2017-02-05 NOTE — Patient Outreach (Signed)
Request received from Janet Caldwell, LCSW to mail patient personal care resources.  Information mailed today. 

## 2017-02-05 NOTE — ED Notes (Signed)
Pt still in CT at this time.

## 2017-02-06 ENCOUNTER — Ambulatory Visit: Payer: Self-pay | Admitting: *Deleted

## 2017-02-06 DIAGNOSIS — L89152 Pressure ulcer of sacral region, stage 2: Secondary | ICD-10-CM | POA: Diagnosis not present

## 2017-02-06 DIAGNOSIS — J9601 Acute respiratory failure with hypoxia: Secondary | ICD-10-CM | POA: Diagnosis not present

## 2017-02-06 DIAGNOSIS — M545 Low back pain: Secondary | ICD-10-CM

## 2017-02-06 DIAGNOSIS — M069 Rheumatoid arthritis, unspecified: Secondary | ICD-10-CM | POA: Diagnosis not present

## 2017-02-06 DIAGNOSIS — I5022 Chronic systolic (congestive) heart failure: Secondary | ICD-10-CM | POA: Diagnosis not present

## 2017-02-06 DIAGNOSIS — J984 Other disorders of lung: Secondary | ICD-10-CM

## 2017-02-06 DIAGNOSIS — E1122 Type 2 diabetes mellitus with diabetic chronic kidney disease: Secondary | ICD-10-CM | POA: Diagnosis not present

## 2017-02-06 DIAGNOSIS — N183 Chronic kidney disease, stage 3 unspecified: Secondary | ICD-10-CM

## 2017-02-06 DIAGNOSIS — R52 Pain, unspecified: Secondary | ICD-10-CM | POA: Diagnosis not present

## 2017-02-06 DIAGNOSIS — N189 Chronic kidney disease, unspecified: Secondary | ICD-10-CM | POA: Diagnosis not present

## 2017-02-06 DIAGNOSIS — Q240 Dextrocardia: Secondary | ICD-10-CM | POA: Diagnosis not present

## 2017-02-06 DIAGNOSIS — M4850XD Collapsed vertebra, not elsewhere classified, site unspecified, subsequent encounter for fracture with routine healing: Secondary | ICD-10-CM | POA: Diagnosis not present

## 2017-02-06 DIAGNOSIS — L8990 Pressure ulcer of unspecified site, unspecified stage: Secondary | ICD-10-CM | POA: Diagnosis present

## 2017-02-06 DIAGNOSIS — I110 Hypertensive heart disease with heart failure: Secondary | ICD-10-CM | POA: Diagnosis not present

## 2017-02-06 DIAGNOSIS — I471 Supraventricular tachycardia: Secondary | ICD-10-CM | POA: Diagnosis not present

## 2017-02-06 DIAGNOSIS — M549 Dorsalgia, unspecified: Secondary | ICD-10-CM | POA: Diagnosis present

## 2017-02-06 DIAGNOSIS — I2510 Atherosclerotic heart disease of native coronary artery without angina pectoris: Secondary | ICD-10-CM

## 2017-02-06 DIAGNOSIS — I252 Old myocardial infarction: Secondary | ICD-10-CM | POA: Diagnosis not present

## 2017-02-06 DIAGNOSIS — I129 Hypertensive chronic kidney disease with stage 1 through stage 4 chronic kidney disease, or unspecified chronic kidney disease: Secondary | ICD-10-CM | POA: Diagnosis not present

## 2017-02-06 DIAGNOSIS — S32010D Wedge compression fracture of first lumbar vertebra, subsequent encounter for fracture with routine healing: Secondary | ICD-10-CM | POA: Diagnosis not present

## 2017-02-06 DIAGNOSIS — S32010A Wedge compression fracture of first lumbar vertebra, initial encounter for closed fracture: Secondary | ICD-10-CM

## 2017-02-06 LAB — PROTIME-INR
INR: 1.32
Prothrombin Time: 16.3 seconds — ABNORMAL HIGH (ref 11.4–15.2)

## 2017-02-06 LAB — URINALYSIS, ROUTINE W REFLEX MICROSCOPIC
Bilirubin Urine: NEGATIVE
Glucose, UA: NEGATIVE mg/dL
Hgb urine dipstick: NEGATIVE
KETONES UR: 5 mg/dL — AB
Leukocytes, UA: NEGATIVE
NITRITE: NEGATIVE
PROTEIN: NEGATIVE mg/dL
Specific Gravity, Urine: 1.031 — ABNORMAL HIGH (ref 1.005–1.030)
pH: 6 (ref 5.0–8.0)

## 2017-02-06 LAB — GLUCOSE, CAPILLARY
GLUCOSE-CAPILLARY: 115 mg/dL — AB (ref 65–99)
GLUCOSE-CAPILLARY: 110 mg/dL — AB (ref 65–99)
Glucose-Capillary: 146 mg/dL — ABNORMAL HIGH (ref 65–99)
Glucose-Capillary: 102 mg/dL — ABNORMAL HIGH (ref 65–99)

## 2017-02-06 LAB — LACTIC ACID, PLASMA: LACTIC ACID, VENOUS: 1 mmol/L (ref 0.5–1.9)

## 2017-02-06 MED ORDER — PANTOPRAZOLE SODIUM 40 MG PO PACK
40.0000 mg | PACK | Freq: Every day | ORAL | Status: DC
  Administered 2017-02-06 – 2017-02-09 (×4): 40 mg via ORAL
  Filled 2017-02-06 (×4): qty 20

## 2017-02-06 MED ORDER — METOPROLOL TARTRATE 50 MG PO TABS
50.0000 mg | ORAL_TABLET | Freq: Two times a day (BID) | ORAL | Status: DC
  Administered 2017-02-06: 50 mg via ORAL
  Filled 2017-02-06: qty 1

## 2017-02-06 MED ORDER — POLYETHYLENE GLYCOL 3350 17 G PO PACK: 17.0000 g | PACK | Freq: Every day | ORAL | Status: DC | PRN

## 2017-02-06 MED ORDER — ONDANSETRON HCL 4 MG/2ML IJ SOLN: 4.0000 mg | Freq: Four times a day (QID) | INTRAMUSCULAR | Status: DC | PRN

## 2017-02-06 MED ORDER — ASPIRIN EC 81 MG PO TBEC: 81.0000 mg | DELAYED_RELEASE_TABLET | Freq: Every day | ORAL | Status: DC

## 2017-02-06 MED ORDER — PANTOPRAZOLE SODIUM 40 MG PO TBEC: 40.0000 mg | DELAYED_RELEASE_TABLET | Freq: Every day | ORAL | Status: DC

## 2017-02-06 MED ORDER — DOXYCYCLINE HYCLATE 100 MG IV SOLR: 100.0000 mg | Freq: Two times a day (BID) | INTRAVENOUS | Status: DC

## 2017-02-06 MED ORDER — WARFARIN - PHARMACIST DOSING INPATIENT
Freq: Every day | Status: DC
  Administered 2017-02-06: 18:00:00

## 2017-02-06 MED ORDER — GABAPENTIN 300 MG PO CAPS
300.0000 mg | ORAL_CAPSULE | Freq: Two times a day (BID) | ORAL | Status: DC
  Administered 2017-02-06: 300 mg via ORAL
  Filled 2017-02-06: qty 1

## 2017-02-06 MED ORDER — MAGNESIUM SULFATE 2 GM/50ML IV SOLN
2.0000 g | Freq: Once | INTRAVENOUS | Status: AC
  Administered 2017-02-06: 2 g via INTRAVENOUS
  Filled 2017-02-06: qty 50

## 2017-02-06 MED ORDER — DILTIAZEM HCL 60 MG PO TABS: 60.0000 mg | ORAL_TABLET | Freq: Four times a day (QID) | ORAL | Status: DC

## 2017-02-06 MED ORDER — MORPHINE SULFATE (PF) 4 MG/ML IV SOLN
2.0000 mg | INTRAVENOUS | Status: DC | PRN
  Administered 2017-02-06 – 2017-02-09 (×6): 2 mg via INTRAVENOUS
  Filled 2017-02-06 (×6): qty 1

## 2017-02-06 MED ORDER — HYDROMORPHONE HCL 2 MG PO TABS
1.0000 mg | ORAL_TABLET | ORAL | Status: DC | PRN
  Administered 2017-02-06 – 2017-02-07 (×3): 1 mg via ORAL
  Filled 2017-02-06 (×4): qty 1

## 2017-02-06 MED ORDER — DOXYCYCLINE HYCLATE 100 MG PO TABS
100.0000 mg | ORAL_TABLET | Freq: Two times a day (BID) | ORAL | Status: DC
  Administered 2017-02-06 – 2017-02-09 (×6): 100 mg via ORAL
  Filled 2017-02-06 (×6): qty 1

## 2017-02-06 MED ORDER — METHOTREXATE 2.5 MG PO TABS
20.0000 mg | ORAL_TABLET | ORAL | Status: DC
Start: 2017-02-11 — End: 2017-02-09

## 2017-02-06 MED ORDER — ALLOPURINOL 100 MG PO TABS
200.0000 mg | ORAL_TABLET | Freq: Every day | ORAL | Status: DC
  Administered 2017-02-06 – 2017-02-09 (×4): 200 mg via ORAL
  Filled 2017-02-06 (×4): qty 2

## 2017-02-06 MED ORDER — ALENDRONATE SODIUM 70 MG PO TABS: 70.0000 mg | ORAL_TABLET | ORAL | Status: DC

## 2017-02-06 MED ORDER — POTASSIUM CHLORIDE CRYS ER 20 MEQ PO TBCR
40.0000 meq | EXTENDED_RELEASE_TABLET | Freq: Once | ORAL | Status: AC
  Administered 2017-02-06: 40 meq via ORAL
  Filled 2017-02-06: qty 2

## 2017-02-06 MED ORDER — ONDANSETRON HCL 4 MG PO TABS: 4.0000 mg | ORAL_TABLET | Freq: Four times a day (QID) | ORAL | Status: DC | PRN

## 2017-02-06 MED ORDER — DOXYCYCLINE HYCLATE 100 MG IV SOLR
100.0000 mg | Freq: Two times a day (BID) | INTRAVENOUS | Status: DC
  Administered 2017-02-06: 100 mg via INTRAVENOUS
  Filled 2017-02-06 (×2): qty 100

## 2017-02-06 MED ORDER — FUROSEMIDE 40 MG PO TABS: 40.0000 mg | ORAL_TABLET | Freq: Every day | ORAL | Status: DC

## 2017-02-06 MED ORDER — SODIUM CHLORIDE 0.9 % IV SOLN
1.0000 g | Freq: Two times a day (BID) | INTRAVENOUS | Status: DC
  Administered 2017-02-06: 1 g via INTRAVENOUS
  Filled 2017-02-06 (×2): qty 1

## 2017-02-06 MED ORDER — COLLAGENASE 250 UNIT/GM EX OINT
TOPICAL_OINTMENT | Freq: Every day | CUTANEOUS | Status: DC
  Administered 2017-02-06: 1 via TOPICAL
  Administered 2017-02-07 – 2017-02-09 (×2): via TOPICAL
  Filled 2017-02-06: qty 30

## 2017-02-06 MED ORDER — METOPROLOL TARTRATE 50 MG PO TABS
50.0000 mg | ORAL_TABLET | Freq: Three times a day (TID) | ORAL | Status: DC
  Administered 2017-02-06 – 2017-02-07 (×3): 50 mg via ORAL
  Filled 2017-02-06 (×3): qty 1

## 2017-02-06 MED ORDER — SODIUM CHLORIDE 0.9 % IV BOLUS (SEPSIS)
500.0000 mL | Freq: Once | INTRAVENOUS | Status: AC
  Administered 2017-02-06: 500 mL via INTRAVENOUS

## 2017-02-06 MED ORDER — GABAPENTIN 400 MG PO CAPS
400.0000 mg | ORAL_CAPSULE | Freq: Two times a day (BID) | ORAL | Status: DC
  Administered 2017-02-06 – 2017-02-09 (×6): 400 mg via ORAL
  Filled 2017-02-06 (×6): qty 1

## 2017-02-06 MED ORDER — ASPIRIN 81 MG PO CHEW
81.0000 mg | CHEWABLE_TABLET | Freq: Every day | ORAL | Status: DC
  Administered 2017-02-06 – 2017-02-09 (×4): 81 mg via ORAL
  Filled 2017-02-06 (×4): qty 1

## 2017-02-06 MED ORDER — SIMVASTATIN 20 MG PO TABS
20.0000 mg | ORAL_TABLET | Freq: Every evening | ORAL | Status: DC
  Administered 2017-02-06 – 2017-02-08 (×3): 20 mg via ORAL
  Filled 2017-02-06 (×3): qty 1

## 2017-02-06 MED ORDER — WARFARIN SODIUM 6 MG PO TABS
6.0000 mg | ORAL_TABLET | Freq: Once | ORAL | Status: AC
  Administered 2017-02-06: 6 mg via ORAL
  Filled 2017-02-06: qty 1

## 2017-02-06 MED ORDER — IPRATROPIUM-ALBUTEROL 0.5-2.5 (3) MG/3ML IN SOLN: 3.0000 mL | Freq: Four times a day (QID) | RESPIRATORY_TRACT | Status: DC | PRN

## 2017-02-06 MED ORDER — METOPROLOL TARTRATE 5 MG/5ML IV SOLN
5.0000 mg | Freq: Once | INTRAVENOUS | Status: DC
  Filled 2017-02-06: qty 5

## 2017-02-06 MED ORDER — INSULIN ASPART 100 UNIT/ML ~~LOC~~ SOLN
0.0000 [IU] | Freq: Three times a day (TID) | SUBCUTANEOUS | Status: DC
Start: 2017-02-06 — End: 2017-02-09
  Administered 2017-02-06 – 2017-02-09 (×3): 1 [IU] via SUBCUTANEOUS

## 2017-02-06 NOTE — Evaluation (Signed)
Physical Therapy Evaluation Patient Details Name: [name] MRN: [id number] DOB: 11-15-1940 Today's Date: 02/06/2017   History of Present Illness  Patient is a 76 y/o female who presents with back pain. Thoracic spine XRAY- New superior endplate compression fracture deformity at L1. D/ced home from rehab ~2 weeks ago from T12 kyphoplasty in August. PMH includes NSTEMI, CHF, DVT, PE, neuropathy.   Clinical Impression  Patient presents with back pain and impaired mobility s/p above. Only agreeable to transferring to chair with max encouragement and coaxing due to pain and not wanting to move. Pt from home alone and recently d/ced from rehab a few weeks ago and doing well. Uses RW PTA. Rn made aware of need for pain meds. Reviewed back precautions and positioning as well as importance of mobility. Will follow acutely to maximize independence and mobility prior to return home.     Follow Up Recommendations Home health PT;Supervision for mobility/OOB    Equipment Recommendations  None recommended by PT    Recommendations for Other Services OT consult     Precautions / Restrictions Precautions Precautions: Fall;Back Precaution Booklet Issued: No Precaution Comments: Reviewed precautions Restrictions Weight Bearing Restrictions: No      Mobility  Bed Mobility Overal bed mobility: Needs Assistance Bed Mobility: Rolling;Sidelying to Sit Rolling: Min guard Sidelying to sit: Min guard;HOB elevated       General bed mobility comments: Increasd time and cues for log roll technique. Use of rail for support. Limited by pain. Mild dizziness, resolved.  Transfers Overall transfer level: Needs assistance Equipment used: Rolling walker (2 wheeled) Transfers: Sit to/from Stand Sit to Stand: Min guard         General transfer comment: Min guard for safety. Stood from [employer], transferred to chair post ambulation.   Ambulation/Gait Ambulation/Gait assistance: Min guard Ambulation  Distance (Feet): 3 Feet Assistive device: Rolling walker (2 wheeled) Gait Pattern/deviations: Trunk flexed;Step-to pattern;Shuffle Gait velocity: decreased   General Gait Details: Able to take a few steps to get to chair with Min guard assist for safety. Guarded and hesitant due to back pain.  Stairs            Wheelchair Mobility    Modified Rankin (Stroke Patients Only)       Balance Overall balance assessment: Needs assistance Sitting-balance support: Feet supported;Single extremity supported Sitting balance-Leahy Scale: Fair Sitting balance - Comments: Using UE support as position of comfort for back pain.   Standing balance support: During functional activity;Bilateral upper extremity supported Standing balance-Leahy Scale: Poor Standing balance comment: Reliant on BUEs for support in standing.                              Pertinent Vitals/Pain Pain Assessment: 0-10 Pain Score: 8  Pain Location: back Pain Descriptors / Indicators: Sore;Aching Pain Intervention(s): Monitored during session;Repositioned;Limited activity within patient's tolerance;Patient requesting pain meds-RN notified    Home Living Family/patient expects to be discharged to:: Private residence Living Arrangements: Alone Available Help at Discharge: Family;Available PRN/intermittently Type of Home: Apartment Home Access: Level entry     Home Layout: One level Home Equipment: Walker - 2 wheels;Cane - single point;Shower seat;Grab bars - tub/shower;Bedside commode      Prior Function Level of Independence: Independent with assistive device(s)         Comments: Uses RW. Not doing much cooking/cleaning since d/ced from rehab end of August. Does her own ADls.  Hand Dominance   Dominant Hand: Right    Extremity/Trunk Assessment   Upper Extremity Assessment Upper Extremity Assessment: Defer to OT evaluation    Lower Extremity Assessment Lower Extremity Assessment:  Generalized weakness    Cervical / Trunk Assessment Cervical / Trunk Assessment: Kyphotic  Communication   Communication: No difficulties  Cognition Arousal/Alertness: Awake/alert Behavior During Therapy: WFL for tasks assessed/performed Overall Cognitive Status: Within Functional Limits for tasks assessed                                        General Comments General comments (skin integrity, edema, etc.): Bp Soft but stable. 100s/60s.    Exercises     Assessment/Plan    PT Assessment Patient needs continued PT services  PT Problem List Decreased strength;Decreased mobility;Pain;Decreased balance;Cardiopulmonary status limiting activity;Decreased knowledge of precautions;Impaired sensation;Decreased activity tolerance       PT Treatment Interventions Therapeutic activities;Gait training;Therapeutic exercise;Patient/family education;Balance training;Functional mobility training    PT Goals (Current goals can be found in the Care Plan section)  Acute Rehab PT Goals Patient Stated Goal: to make this back pain go away PT Goal Formulation: With patient Time For Goal Achievement: 02/20/17 Potential to Achieve Goals: Good    Frequency Min 3X/week   Barriers to discharge Decreased caregiver support home alone    Co-evaluation               AM-PAC PT "6 Clicks" Daily Activity  Outcome Measure Difficulty turning over in bed (including adjusting bedclothes, sheets and blankets)?: None Difficulty moving from lying on back to sitting on the side of the bed? : None Difficulty sitting down on and standing up from a chair with arms (e.g., wheelchair, bedside commode, etc,.)?: None Help needed moving to and from a bed to chair (including a wheelchair)?: A Little Help needed walking in hospital room?: A Little Help needed climbing 3-5 steps with a railing? : A Little 6 Click Score: 21    End of Session Equipment Utilized During Treatment: Gait  belt Activity Tolerance: Patient limited by pain Patient left: in chair;with call bell/phone within reach;with chair alarm set Nurse Communication: Mobility status PT Visit Diagnosis: Pain;Difficulty in walking, not elsewhere classified (R26.2);Unsteadiness on feet (R26.81) Pain - part of body:  (back)    Time: 4132-4401 PT Time Calculation (min) (ACUTE ONLY): 28 min   Charges:   PT Evaluation $PT Eval Moderate Complexity: 1 Mod PT Treatments $Therapeutic Activity: 8-22 mins   PT G Codes:        Mylo Red, PT, DPT [phone]    Blake Divine A Dolton Shaker 02/06/2017, 9:57 AM

## 2017-02-06 NOTE — ED Notes (Signed)
Per Dr. Rubin Payor, pt given ice water.

## 2017-02-06 NOTE — Progress Notes (Signed)
PHARMACIST - PHYSICIAN COMMUNICATION  CONCERNING: P&T Medication Policy Regarding Oral Bisphosphonates  RECOMMENDATION: Your order for alendronate (Fosamax), ibandronate (Boniva), or risedronate (Actonel) has been discontinued at this time.  If the patient's post-hospital medical condition warrants safe use of this class of drugs, please resume the pre-hospital regimen upon discharge.  DESCRIPTION:  Alendronate (Fosamax), ibandronate (Boniva), and risedronate (Actonel) can cause severe esophageal erosions in patients who are unable to remain upright at least 30 minutes after taking this medication.   Since brief interruptions in therapy are thought to have minimal impact on bone mineral density, the Pharmacy & Therapeutics Committee has established that bisphosphonate orders should be routinely discontinued during hospitalization.   To override this safety policy and permit administration of Boniva, Fosamax, or Actonel in the hospital, prescribers must write "DO NOT HOLD" in the comments section when placing the order for this class of medications.  Link Snuffer, PharmD, BCPS Clinical Pharmacist If questions, please call [phone] 02/06/2017, 6:55 AM

## 2017-02-06 NOTE — Progress Notes (Signed)
ANTICOAGULATION CONSULT NOTE - Initial Consult  Pharmacy Consult for Coumadin Indication: h/o VTE  Allergies  Allergen Reactions  . Penicillins Rash    Has patient had a PCN reaction causing immediate rash, facial/tongue/throat swelling, SOB or lightheadedness with hypotension: Yes Has patient had a PCN reaction causing severe rash involving mucus membranes or skin necrosis: No Has patient had a PCN reaction that required hospitalization: No Has patient had a PCN reaction occurring within the last 10 years: No If all of the above answers are "NO", then may proceed with Cephalosporin use.   . Shellfish Allergy Anaphylaxis  . Hydrocodone Other (See Comments)    Pt says "it's too high powered so it sends me out to space"  . Oxycodone Other (See Comments)    Pt says "it's too high powered so it sends me out to space"  . Levaquin [Levofloxacin] Hives    Patient Measurements:    Vital Signs: Temp: 97.9 F (36.6 C) (09/04 1654) Temp Source: Oral (09/04 1654) BP: 115/70 (09/05 0400) Pulse Rate: 109 (09/05 0400)  Labs:  Recent Labs  02/05/17 02/05/17 1707 02/05/17 1715  HGB  --   --  12.1  HCT  --   --  37.9  PLT  --   --  402*  LABPROT  --  16.5*  --   INR 1.3 1.34  --   CREATININE  --   --  1.23*  TROPONINI  --  <0.03  --     CrCl cannot be calculated (Unknown ideal weight.).   Medical History: Past Medical History:  Diagnosis Date  . Allergy   . Anemia   . Arthritis   . Blood transfusion without reported diagnosis   . CAD (coronary artery disease)    NSTEMI in setting of gallstone pancreatitis 05/2011:  LHC with 3v CAD; CABG was performed 06/14/11: RIMA-LAD, SVG-ramus, SVG-RCA  . Carotid stenosis    Dopplers 06/12/11: LICA 60-79%.  . Cataract   . CHF (congestive heart failure) (HCC)   . Chronic bronchitis   . Chronic kidney disease   . Chronic systolic heart failure (HCC)   . Collagen vascular disease (HCC)   . Dextrocardia   . Diabetes mellitus without  complication (HCC)   . DVT of leg (deep venous thrombosis) (HCC) ~2006   left  . Fracture 05/24/2011   right; "did not have surgery"  . GERD (gastroesophageal reflux disease)   . H/O hiatal hernia   . HLD (hyperlipidemia)   . Hypertension   . Ischemic cardiomyopathy    Echocardiogram 06/05/11: EF 40-45%, anteroseptal hypokinesis, apical hypokinesis, mild LAE  . Myocardial infarction (HCC)   . Neuromuscular disorder (HCC)   . Neuropathy   . Osteoporosis   . Oxygen deficiency    2l  . Pneumonia 06/13/11   "couple times; long time ago"  . Pulmonary embolus [facility]) March 2013  . Rheumatoid arthritis (HCC)   . Situs inversus with dextrocardia    CT 06/2011: situs inversus totalis.  What would typically be called RCA arose from anterior sinus of Valsalva.  What would typically be called the left main arises from the posterior sinus of Valsalva and gives rise to a large first diagonal branch and diminutive circumflex    Medications:  No current facility-administered medications on file prior to encounter.    Current Outpatient Prescriptions on File Prior to Encounter  Medication Sig Dispense Refill  . allopurinol (ZYLOPRIM) 100 MG tablet Take 200 mg by mouth daily.     Marland Kitchen  aspirin EC 81 MG tablet Take 81 mg by mouth daily.    . cholecalciferol (VITAMIN D) 1000 UNITS tablet Take 1,000 Units by mouth daily.    . folic acid (FOLVITE) 800 MCG tablet Take 800 mcg by mouth daily.     . furosemide (LASIX) 40 MG tablet Take 1 tablet (40 mg total) by mouth daily. 90 tablet 3  . gabapentin (NEURONTIN) 300 MG capsule Take 300 mg by mouth 2 (two) times daily.      Marland Kitchen ipratropium-albuterol (DUONEB) 0.5-2.5 (3) MG/3ML SOLN Take 3 mLs by nebulization every 6 (six) hours as needed. (Patient taking differently: Take 3 mLs by nebulization every 6 (six) hours as needed (wheezing/sob). ) 360 mL 5  . metFORMIN (GLUCOPHAGE-XR) 500 MG 24 hr tablet Take 500 mg by mouth daily as needed (low blood sugar).     .  methotrexate (RHEUMATREX) 2.5 MG tablet Take 20 mg by mouth every Monday.     . metoprolol tartrate (LOPRESSOR) 25 MG tablet Take 25 mg by mouth 2 (two) times daily.    Marland Kitchen omeprazole (PRILOSEC) 20 MG capsule Take 20 mg by mouth every morning.      . simvastatin (ZOCOR) 20 MG tablet Take 20 mg by mouth every evening.     . warfarin (COUMADIN) 2 MG tablet Take as directed by [facility] (Patient taking differently: Take 2-4 mg by mouth See admin instructions. Take 2mg  on Sat/Sun and 4mg  on Mon-Fri.) 65 tablet 3  . alendronate (FOSAMAX) 70 MG tablet Take 70 mg by mouth every Sunday.     Respiratory Therapy Supplies (FLUTTER) DEVI Use 10-15 times daily 1 each 0    Assessment: 76 y.o. female admitted with back pain/compression fracture, h/o PE/DVT, to continue Coumadin Goal of Therapy:  INR 2-3 Monitor platelets by anticoagulation protocol: Yes   Plan:  Coumadin 6 mg po x 1  Daily INR  Orissa Arreaga, Marland Kitchen 02/06/2017,4:27 AM

## 2017-02-06 NOTE — Progress Notes (Signed)
Patient is in extreme pain in her back and does not want to use chest vest for chest PT. RT has brought patient flutter valve and instructed her on use. Patient puts forth great effort. Patient will use flutter for chest PT and will use switch to chest vest if able to in future.

## 2017-02-06 NOTE — ED Notes (Signed)
Per Tobi Bastos, RN will attempt to draw labs for cultures.

## 2017-02-06 NOTE — ED Notes (Signed)
Admitting provider at the bedside.

## 2017-02-06 NOTE — Consult Note (Signed)
Cardiology Consultation:   Patient ID: MARGARETH [name]; [id number]; December 17, 1940   Admit date: 02/05/2017 Date of Consult: 02/06/2017  Primary Care Provider: Elias Else, MD Primary Cardiologist: Dr Clifton James  Patient Profile:   [name] is a 76 y.o. 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 Butler Denmark.  History of Present Illness:   [name] Presented to [facility] on 02/05/2017 with complaints of back pain. She had kyphoplasty surgery on 8/1 and her pain had improved, but then worsened 3 days PTA. She also has draining sacral decubitus ulcers developed while at rehab after surgery and elevated WBCs, on Doxycycline.  She has been found to have an irregular heart rhythm with SVT which is new for her.  She has had 2 episodes per chart review, EKG showing sinus rhythm with varying P waves. She is on metoprolol 50 mg BID which has not suppressed the SVT. I note an episode of SVT at 200 bpm on tele today at 1434. The patient admits that she felt her heart beating fast and had mild dizziness. No chest pain or increased shortness of breath. She notes that she has had this feeling over the past several weeks about once or twice a week when she is up moving about trying to be more active. She has not had syncope or near syncope. She usually sits down and the feeling passes. She has never had this before. She has chronic shortness of breath, no worse recently. She has been in considerable pain since her back surgery. She denies any recent med changes other than recent antibiotic or use of supplements.   She was last seen in the cardiology office by Dr. Clifton James on 08/09/2016 at which time she had no chest pain but mild increase in dyspnea. PFT's at  primary care had shown severe COPD. 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   Her most recent echo on 08/28/16 showed LV EF 55-60%, grade 1 DD, no significant valve abnormalities.   Past Medical History:  Diagnosis Date  . Allergy   . Anemia   . Arthritis   . Blood transfusion without reported diagnosis   . CAD (coronary artery disease)    NSTEMI in setting of gallstone pancreatitis 05/2011:  LHC with 3v CAD; CABG was performed 06/14/11: RIMA-LAD, SVG-ramus, SVG-RCA  . Carotid stenosis    Dopplers 06/12/11: LICA 60-79%.  . Cataract   . CHF (congestive heart failure) (HCC)   . Chronic bronchitis   . Chronic kidney disease   . Chronic systolic heart failure (HCC)   . Collagen vascular disease (HCC)   . Dextrocardia   . Diabetes mellitus without complication (HCC)   . DVT of leg (deep venous thrombosis) (HCC) ~2006   left  . Fracture 05/24/2011   right; "did not have surgery"  . GERD (gastroesophageal reflux disease)   . H/O hiatal hernia   . HLD (hyperlipidemia)   . Hypertension   . Ischemic cardiomyopathy    Echocardiogram 06/05/11: EF 40-45%, anteroseptal hypokinesis, apical hypokinesis, mild LAE  . Myocardial infarction (HCC)   . Neuromuscular disorder (HCC)   . Neuropathy   . Osteoporosis   .  Oxygen deficiency    2l  . Pneumonia 06/13/11   "couple times; long time ago"  . Pulmonary embolus [facility]) March 2013  . Rheumatoid arthritis (HCC)   . Situs inversus with dextrocardia    CT 06/2011: situs inversus totalis.  What would typically be called RCA arose from anterior sinus of Valsalva.  What would typically be called the left main arises from the posterior sinus of Valsalva and gives rise to a large first diagonal branch and diminutive circumflex    Past Surgical History:  Procedure Laterality Date  . ARCH AORTOGRAM Right 06/11/2011   Procedure: ARCH AORTOGRAM;  Surgeon: Kathleene Hazel, MD;  Location: [facility] CATH  LAB;  Service: Cardiovascular;  Laterality: Right;  . CARDIAC CATHETERIZATION  06/11/11  . CHOLECYSTECTOMY    . CORONARY ARTERY BYPASS GRAFT  06/14/2011   Procedure: CORONARY ARTERY BYPASS GRAFTING (CABG);  Surgeon: Alleen Borne, MD;  Location: [facility] OR;  Service: Open Heart Surgery;  Laterality: N/A;  . ERCP  06/03/2011   Procedure: ENDOSCOPIC RETROGRADE CHOLANGIOPANCREATOGRAPHY (ERCP);  Surgeon: Petra Kuba, MD;  Location: [facility] OR;  Service: Endoscopy;  Laterality: N/A;  . KYPHOPLASTY N/A 01/02/2017   Procedure: KYPHOPLASTY L2;  Surgeon: Kennedy Bucker, MD;  Location: [facility] ORS;  Service: Orthopedics;  Laterality: N/A;  . LEFT HEART CATHETERIZATION WITH CORONARY ANGIOGRAM N/A 06/11/2011   Procedure: LEFT HEART CATHETERIZATION WITH CORONARY ANGIOGRAM;  Surgeon: Kathleene Hazel, MD;  Location: [facility] CATH LAB;  Service: Cardiovascular;  Laterality: N/A;  . VAGINAL HYSTERECTOMY  1970's     Home Medications:  Prior to Admission medications   Medication Sig Start Date End Date Taking? Authorizing Provider  acetaminophen (TYLENOL) 160 MG/5ML elixir Take 320 mg by mouth every 6 (six) hours.   Yes [provider]  allopurinol (ZYLOPRIM) 100 MG tablet Take 200 mg by mouth daily.    Yes [provider]  aspirin EC 81 MG tablet Take 81 mg by mouth daily.   Yes [provider]  cholecalciferol (VITAMIN D) 1000 UNITS tablet Take 1,000 Units by mouth daily.   Yes [provider]  doxycycline (VIBRAMYCIN) 100 MG capsule Take 100 mg by mouth 2 (two) times daily. Has 4 days left 02/01/17  Yes [provider]  folic acid (FOLVITE) 800 MCG tablet Take 800 mcg by mouth daily.    Yes [provider]  furosemide (LASIX) 40 MG tablet Take 1 tablet (40 mg total) by mouth daily. 08/29/16 02/05/17 Yes Kathleene Hazel, MD  gabapentin (NEURONTIN) 300 MG capsule Take 300 mg by mouth 2 (two) times daily.     Yes [provider]  ipratropium-albuterol (DUONEB)  0.5-2.5 (3) MG/3ML SOLN Take 3 mLs by nebulization every 6 (six) hours as needed. Patient taking differently: Take 3 mLs by nebulization every 6 (six) hours as needed (wheezing/sob).  11/14/16  Yes Merwyn Katos, MD  metFORMIN (GLUCOPHAGE-XR) 500 MG 24 hr tablet Take 500 mg by mouth daily as needed (low blood sugar).  10/31/16  Yes [provider]  methotrexate (RHEUMATREX) 2.5 MG tablet Take 20 mg by mouth every Monday.    Yes [provider]  metoprolol tartrate (LOPRESSOR) 25 MG tablet Take 25 mg by mouth 2 (two) times daily.   Yes [provider]  omeprazole (PRILOSEC) 20 MG capsule Take 20 mg by mouth every morning.     Yes [provider]  simvastatin (ZOCOR) 20 MG tablet Take 20 mg by mouth every evening.  Yes [provider]  warfarin (COUMADIN) 2 MG tablet Take as directed by [facility] Patient taking differently: Take 2-4 mg by mouth See admin instructions. Take 2mg  on Sat/Sun and 4mg  on Mon-Fri. 10/17/16  Yes , MD  alendronate (FOSAMAX) 70 MG tablet Take 70 mg by mouth every Sunday.     [provider]  Respiratory Therapy Supplies (FLUTTER) DEVI Use 10-15 times daily 09/10/16   Monday, MD    Inpatient Medications: Scheduled Meds: . allopurinol  200 mg Oral Daily  . aspirin  81 mg Oral Daily  . collagenase   Topical Daily  . doxycycline  100 mg Oral Q12H  . gabapentin  400 mg Oral BID  . insulin aspart  0-9 Units Subcutaneous TID WC  . [START ON 02/11/2017] methotrexate  20 mg Oral Q Mon  . metoprolol tartrate  5 mg Intravenous Once  . metoprolol tartrate  50 mg Oral BID  . pantoprazole sodium  40 mg Oral Daily  . simvastatin  20 mg Oral QPM  . Warfarin - Pharmacist Dosing Inpatient   Does not apply q1800   Continuous Infusions:  PRN Meds: HYDROmorphone, ipratropium-albuterol, morphine injection, ondansetron **OR** ondansetron (ZOFRAN) IV, polyethylene glycol  Allergies:      Allergies  Allergen Reactions  . Penicillins Rash    Has patient had a PCN reaction causing immediate rash, facial/tongue/throat swelling, SOB or lightheadedness with hypotension: Yes Has patient had a PCN reaction causing severe rash involving mucus membranes or skin necrosis: No Has patient had a PCN reaction that required hospitalization: No Has patient had a PCN reaction occurring within the last 10 years: No If all of the above answers are "NO", then may proceed with Cephalosporin use.   . Shellfish Allergy Anaphylaxis  . Hydrocodone Other (See Comments)    Pt says "it's too high powered so it sends me out to space"  . Oxycodone Other (See Comments)    Pt says "it's too high powered so it sends me out to space"  . Levaquin [Levofloxacin] Hives    Social History:   Social History   Social History  . Marital status: Widowed    Spouse name: N/A  . Number of children: N/A  . Years of education: N/A   Occupational History  . Not on file.   Social History Main Topics  . Smoking status: Never Smoker  . Smokeless tobacco: Never Used  . Alcohol use No  . Drug use: No  . Sexual activity: No   Other Topics Concern  . Not on file   Social History Narrative  . No narrative on file    Family History:    Family History  Problem Relation Age of Onset  . Heart disease Father   . Heart disease Sister      ROS:  Please see the history of present illness.  ROS  All other ROS reviewed and negative.     Physical Exam/Data:   Vitals:   02/06/17 0902 02/06/17 1029 02/06/17 1136 02/06/17 1300  BP: 100/65     Pulse: 98  99   Resp: (!) 23 20 20    Temp:   98.6 F (37 C)   TempSrc:   Oral   SpO2: 100% 100% 100%   Weight:    163 lb (73.9 kg)  Height:    5\' 2"  (1.575 m)    Intake/Output Summary (Last 24 hours) at 02/06/17 1541 Last data filed at 02/06/17 0800  Gross  per 24 hour  Intake              240 ml  Output              250 ml  Net              -10 ml    Filed Weights   02/06/17 1300  Weight: 163 lb (73.9 kg)   Body mass index is 29.81 kg/m.  General:  Well nourished, well developed, in no acute distress. Appears to be in pain from her back HEENT: normal Lymph: no adenopathy Neck: no JVD Endocrine:  No thryomegaly Vascular: No carotid bruits; FA pulses 2+ bilaterally without bruits  Cardiac:  normal S1, S2; RRR; no murmur Lungs:  clear to auscultation bilaterally, no wheezing, rhonchi or rales  Abd: soft, nontender, no hepatomegaly  Ext: Trace lower extremity edema Musculoskeletal:  No deformities, BUE and BLE strength normal and equal Skin: warm and dry  Neuro:  CNs 2-12 intact, no focal abnormalities noted Psych:  Normal affect   EKG:  The EKG was personally reviewed and demonstrates:  Sinus tachycardia 115 bpm, with PVC's Telemetry:  Telemetry was personally reviewed and demonstrates:  Sinus tachycardia in low 100's, noted SVT at 200 bpm for 1.5 minutes today.   Relevant CV Studies:  Echocardiogram 08/28/16 Study Conclusions - Left ventricle: The cavity size was normal. Wall thickness was   normal. Systolic function was normal. The estimated ejection   fraction was in the range of 55% to 60%. Although no diagnostic   regional wall motion abnormality was identified, this possibility   cannot be completely excluded on the basis of this study. Doppler   parameters are consistent with abnormal left ventricular   relaxation (grade 1 diastolic dysfunction). - Aortic valve: There was no stenosis. - Mitral valve: Mildly calcified annulus. There was no significant   regurgitation. - Right ventricle: Poorly visualized. Probably normal size and   systolic function. - Pulmonary arteries: No complete TR doppler jet so unable to   estimate PA systolic pressure. - Systemic veins: IVC not visualized.  Impressions: - Normal LV size with EF 55-60%. RV poorly visualized, probably   normal. No significant valvular  abnormalities. ------------------------------------------------------------------------------------------------------------ Carotid dopplers 09/07/2015 Heterogeneous plaque, bilaterally. Stable, 1-39% RICA stenosis. 60-79% LICA stenosis, stable over serial exams. Patent vertebral arteries with antegrade flow. Normal subclavian arteries, bilaterally. -------------------------------------------------------------------------------------------------------------- Myocardial perfusion imaging 12/13/2014 Study Highlights  There was no ST segment deviation noted during stress.  This is a low risk study.   Low risk stress nuclear study with a small, severe, fixed apical defect consistent with prior infarct; no ischemia; gated images technically suboptimal and EF could not be quantitated; suggest echo to assess LV function.     Laboratory Data:  Chemistry Recent Labs Lab 02/05/17 1715  NA 138  K 3.6  CL 97*  CO2 28  GLUCOSE 145*  BUN 25*  CREATININE 1.23*  CALCIUM 9.3  GFRNONAA 42*  GFRAA 49*  ANIONGAP 13    No results for input(s): PROT, ALBUMIN, AST, ALT, ALKPHOS, BILITOT in the last 168 hours. Hematology Recent Labs Lab 02/05/17 1715  WBC 18.4*  RBC 3.73*  HGB 12.1  HCT 37.9  MCV 101.6*  MCH 32.4  MCHC 31.9  RDW 18.0*  PLT 402*   Cardiac Enzymes Recent Labs Lab 02/05/17 1707  TROPONINI <0.03   No results for input(s): TROPIPOC in the last 168 hours.  BNP Recent Labs Lab 02/05/17 1707  BNP 39.5  DDimer No results for input(s): DDIMER in the last 168 hours.  Radiology/Studies:  Dg Chest 2 View  Result Date: 02/05/2017 CLINICAL DATA:  Back pain EXAM: CHEST  2 VIEW COMPARISON:  10/26/2016 FINDINGS: Bilateral lower lobe scarring/ atelectasis. No pleural effusion or pneumothorax. The heart is normal in size. Situs inversus. Postsurgical changes related to prior CABG. Median sternotomy. Thoracolumbar spine is described on dedicated radiographs. IVC filter.  IMPRESSION: Thoracolumbar spine is described on dedicated radiographs. No evidence of acute cardiopulmonary disease. Electronically Signed   By: Charline Bills M.D.   On: 02/05/2017 20:31   Dg Thoracic Spine 2 View  Result Date: 02/05/2017 CLINICAL DATA:  Back pain, recent vertebroplasty EXAM: THORACIC SPINE 2 VIEWS COMPARISON:  MRI thoracic spine dated 12/31/2016 FINDINGS: Prior vertebral augmentation at T12. Mild degenerative changes of the thoracic spine. New superior endplate compression fracture deformity at L1 with approximately 30% loss of height. No retropulsion. IMPRESSION: New superior endplate compression fracture deformity at L1 with approximately 30% loss of height. No retropulsion. Prior vertebral augmentation at T12. Electronically Signed   By: Charline Bills M.D.   On: 02/05/2017 20:30   Dg Lumbar Spine 2-3 Views  Result Date: 02/05/2017 CLINICAL DATA:  Severe back pain, recent vertebroplasty EXAM: LUMBAR SPINE - 2-3 VIEW COMPARISON:  None. FINDINGS: Prior vertebral augmentation at T12. New superior endplate compression fracture deformity at L1 with approximately 30% loss of height. No retropulsion. Mild degenerative changes of the upper lumbar spine. IVC filter. IMPRESSION: New superior endplate compression fracture deformity at L1 with approximately 30% loss of height. No retropulsion. Electronically Signed   By: Charline Bills M.D.   On: 02/05/2017 20:32   Ct Angio Chest Pe W And/or Wo Contrast  Result Date: 02/05/2017 CLINICAL DATA:  Back pain with recent surgery EXAM: CT ANGIOGRAPHY CHEST WITH CONTRAST TECHNIQUE: Multidetector CT imaging of the chest was performed using the standard protocol during bolus administration of intravenous contrast. Multiplanar CT image reconstructions and MIPs were obtained to evaluate the vascular anatomy. CONTRAST:  100 mL Isovue 370 intravenous COMPARISON:  Radiograph 02/05/2017, CT chest 05/26/2013, CT 12/30/2016 FINDINGS: Cardiovascular:  Satisfactory opacification of the pulmonary arteries to the segmental level. No evidence of pulmonary embolism. Atherosclerotic calcifications of the aorta. No aneurysmal dilatation. Right-sided aortic arch with mirror image branching of the great vessels. No dissection is seen. Dextrocardia. Coronary artery calcification. No large pericardial effusion. Mediastinum/Nodes: Midline trachea. No thyroid. No significantly enlarged lymph nodes. Distal hiatal hernia. Lungs/Pleura: Bronchiectasis in the bilateral lower lobes with bronchial thickening and consolidation. Scattered ground-glass foci in the left upper lobe with mild tree-in-bud nodularity within the bilateral upper lobes and left greater than right lower lobes. No pleural effusion. No pneumothorax. Upper Abdomen:  Situs in versus.  Pneumobilia re- demonstrated. Musculoskeletal: Sternotomy changes. No acute or suspicious bone lesion Review of the MIP images confirms the above findings. IMPRESSION: 1. Negative for acute pulmonary embolus or aortic dissection 2. 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 Dextrocardia. 3. Bilateral lower lobe bronchiectasis with chronic areas of consolidation and bronchial thickening. Scattered ground-glass foci and tree-in-bud nodularity within the upper and lower lobes, compatible with respiratory infection, consider atypical organisms. 4. Situs  inversus.  Pneumobilia, postsurgical Aortic Atherosclerosis (ICD10-I70.0). Electronically Signed   By: Jasmine Pang M.D.   On: 02/05/2017 23:02    Assessment and Plan:   1. SVT -Noted on telemetry. Today noted 1 1/2 minutes of SVT- narrow complex tachycardia with rate  of 200 bpm. Pt has mild dizziness and felt her heart beating fast. No chest pain of increase in her usual shortness of breath. She has been having these episodes for the last few weeks. Baseline she is tachycardic with rates in the low 100's. -K+ 3.6, Magnesium  1.6.  Thyroid testing ordered. Will supplement Magnesium and potassium.  -Will increase BB mildly to 50 mg TID as BP is soft today to see if this controls the SVT. This may be transient with her recent back surgery and pain. If BB does not control  The SVT she may need an antiarrhythmic. Consider EP consult if SVT persists after she gets over this issue with her back. -Agree with holding lasix.  -We will monitor her response  2. Ischemic cardiomyopathy/chronic systolic CHF -Last echo in 08/2016 with normal EF 55-60% -Pt is on daily lasix with stable wt and stable mild LE edema.  -Wt at last office visit in 08/2016 was 165. Today wt is 163. -BNP 39.5 -Pt does not appear to be significantly volume overloaded.   3. Chronic dyspnea -Likely secondary to lung disease  4. CAD s/p CABG 2013 -No angina.  -Stress Myoview in 11/2014 with no ischemia.  -continue aspirin, statin and BB.  5. Hx of saddle PE and atrial thrombus in 2013 -on lifetime anticoagulation with coumadin  Signed, Berton Bon, NP  02/06/2017 3:41 PM  Personally seen and examined. Agree with above.  76 year old post back surgery, currently in pain, CAD, CABG, prior laminated LV thrombus, PE 2013, with PSVT on tele.   Alert but in pain, CABG scar, RRR, 2/6 SM, Lungs clear  PSVT  - we will try to suppress with metoprolol, increasing to TID dosing.   - If unable, we can try amiodarone.   - Not a good candidate for ablative measures at this time  - Inflammatory response from recent surgery likely driving her symptoms  - Dizziness could also be secondary to low BP's  - normal EF  Prior PE/laminated LV thrombus  - 2013. Was committed to lifetime coumadin.   - INR low, may need surgery?  Dextrocardia  - personally reviewed CXR  Will follow  Donato Schultz, MD

## 2017-02-06 NOTE — Progress Notes (Signed)
PROGRESS NOTE    [name]   LOV:[id number]  DOB: 01/18/1941  DOA: 02/05/2017 PCP: Elias Else, MD   Brief Narrative:  [name] 76 y.o. 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  She presents for back pain for 3 days with no acute trauma. Had kyphoplasty of T12 on 8/1 and back pain improved until now. Xray shows a new L1 compression fx.  WBC upt from 7.90 on 8/2 to 18.4. She has draining sacral decubitus ulcers which developed while in rehab after surgery and was stared on Doxycycline which she has taken for 4/7 days pior to admission. She has a chronic cough which is now worse but non productive.  CT chest shows:  Bilateral lower lobe bronchiectasis with chronic areas of consolidation and bronchial thickening. Scattered ground-glass foci and tree-in-bud nodularity within the upper and lower lobes,compatible with respiratory infection, consider atypical organisms.  Found to have an irregular HR with SVT in the hospital which is new.  She is admitted for pain control, management of leukocytosis and arrhythmias.  Subjective: Has back pain which is worse with moving.  ROS: no complaints of nausea, vomiting, constipation diarrhea, cough, dyspnea or dysuria. No other complaints.   Assessment & Plan:   Principal Problem:   Acute back pain - L1 superior endplate fx with 30% loss of height - s/p T 12 augmentation by Dr Rosita Kea in [place] - point tenderness in L1 noted- will try pain control -  Hydrocodone and Oxycodone cause nausea- will try Dilaudid which she did fine with yesterday - cont fosamax and Vit D- will add Calcium on discharge   Active Problems: SVT - has had 2 episodes so far despite taking home dose of Metoprolol 50 mg BID - when not in SVT,  EKG was showing variating P waves - she was dehydrated per U sp gravity and so I have given her a 500 cc NS bolus - cardiology called- she see's Dr Jaye Beagle - check TFTs    Chronic systolic heart failure  - EF 40-45% in the past but improved to normal on last ECHO - cont Metoprolol- not on ACE I  Small decubitus ulcers - stage 3- no drainage noted- appreciate WOC eval>> Santyl and air mattress - Doxy to stop on 9/7 for 7 day course    CAD s/p CABG - Aspirin    DM2 (diabetes mellitus, type 2)  - hold Metformin due to CT with contrast - ISS TID    Saddle embolus of pulmonary artery  - Coumadin per pharmacy- INR subtherapeutic    Primary ciliary dyskinesia with chronic bronchiectasis - no changes in chronic cough-  - CT changes noted but as she has no new symptoms, d/c Meropenem  CKD 3 - stable    Rheumatoid arthritis  - Methotrexate  Gout - Allopurinol  DVT prophylaxis: SCDs Code Status: full code Family Communication: sister Disposition Plan: to be determined Consultants:   cardiology Procedures:    Antimicrobials:  Anti-infectives    Start     Dose/Rate Route Frequency Ordered Stop   02/06/17 0600  meropenem (MERREM) 1 g in sodium chloride 0.9 % 100 mL IVPB  Status:  Discontinued    Comments:  Meropenem 1 g IV q12h for CrCl < 50 mL/min   1 g 200 mL/hr over 30 Minutes  Intravenous Every 12 hours 02/06/17 0302 02/06/17 1318   02/06/17 0300  doxycycline (VIBRAMYCIN) 100 mg in dextrose 5 % 250 mL IVPB     100 mg 125 mL/hr over 120 Minutes Intravenous Every 12 hours 02/06/17 0216     02/06/17 0215  doxycycline (VIBRAMYCIN) 100 mg in dextrose 5 % 250 mL IVPB  Status:  Discontinued     100 mg 125 mL/hr over 120 Minutes Intravenous Every 12 hours 02/06/17 0215 02/06/17 0216       Objective: Vitals:   02/06/17 0810 02/06/17 0902 02/06/17 1029 02/06/17 1136  BP: 93/74 100/65    Pulse: (!) 115 98  99  Resp:  (!) 23 20 20   Temp: 98.4 F (36.9 C)   98.6 F (37  C)  TempSrc: Oral   Oral  SpO2:  100% 100% 100%    Intake/Output Summary (Last 24 hours) at 02/06/17 1322 Last data filed at 02/06/17 0800  Gross per 24 hour  Intake              240 ml  Output              250 ml  Net              -10 ml   There were no vitals filed for this visit.  Examination: General exam: Appears comfortable  HEENT: PERRLA, oral mucosa moist, no sclera icterus or thrush Respiratory system: Clear to auscultation. Respiratory effort normal. Cardiovascular system: S1 & S2 heard, RRR.  No murmurs  Gastrointestinal system: Abdomen soft, non-tender, nondistended. Normal bowel sound. No organomegaly Central nervous system: Alert and oriented. No focal neurological deficits. Extremities: No cyanosis, clubbing or edema Skin: No rashes or ulcers MSK: point tenderness at L1 Psychiatry:  Mood & affect appropriate.     Data Reviewed: I have personally reviewed following labs and imaging studies  CBC:  Recent Labs Lab 02/05/17 1715  WBC 18.4*  HGB 12.1  HCT 37.9  MCV 101.6*  PLT 402*   Basic Metabolic Panel:  Recent Labs Lab 02/05/17 1707 02/05/17 1715  NA  --  138  K  --  3.6  CL  --  97*  CO2  --  28  GLUCOSE  --  145*  BUN  --  25*  CREATININE  --  1.23*  CALCIUM  --  9.3  MG 1.6*  --    GFR: CrCl cannot be calculated (Unknown ideal weight.). Liver Function Tests: No results for input(s): AST, ALT, ALKPHOS, BILITOT, PROT, ALBUMIN in the last 168 hours. No results for input(s): LIPASE, AMYLASE in the last 168 hours. No results for input(s): AMMONIA in the last 168 hours. Coagulation Profile:  Recent Labs Lab 02/05/17 02/05/17 1707 02/06/17 1140  INR 1.3 1.34 1.32   Cardiac Enzymes:  Recent Labs Lab 02/05/17 1707  TROPONINI <0.03   BNP (last 3 results) No results for input(s): PROBNP in the last 8760 hours. HbA1C: No results for input(s): HGBA1C in the last 72 hours. CBG:  Recent Labs Lab 02/06/17 0752 02/06/17 1136    GLUCAP 115* 146*   Lipid Profile: No results for input(s): CHOL, HDL, LDLCALC, TRIG, CHOLHDL, LDLDIRECT in the last 72 hours. Thyroid Function Tests: No results for input(s): TSH, T4TOTAL, FREET4, T3FREE, THYROIDAB in the last 72 hours. Anemia Panel: No results for input(s): VITAMINB12, FOLATE, FERRITIN, TIBC, IRON, RETICCTPCT in the last 72 hours. Urine analysis:    Component Value Date/Time   COLORURINE YELLOW 02/06/2017 0000  APPEARANCEUR CLEAR 02/06/2017 0000   APPEARANCEUR Hazy 05/25/2013 1613   LABSPEC 1.031 (H) 02/06/2017 0000   LABSPEC 1.009 05/25/2013 1613   PHURINE 6.0 02/06/2017 0000   GLUCOSEU NEGATIVE 02/06/2017 0000   GLUCOSEU Negative 05/25/2013 1613   HGBUR NEGATIVE 02/06/2017 0000   BILIRUBINUR NEGATIVE 02/06/2017 0000   BILIRUBINUR Negative 05/25/2013 1613   KETONESUR 5 (A) 02/06/2017 0000   PROTEINUR NEGATIVE 02/06/2017 0000   UROBILINOGEN 1.0 08/12/2011 2028   NITRITE NEGATIVE 02/06/2017 0000   LEUKOCYTESUR NEGATIVE 02/06/2017 0000   LEUKOCYTESUR 1+ 05/25/2013 1613   Sepsis Labs: @LABRCNTIP (procalcitonin:4,lacticidven:4) )No results found for this or any previous visit (from the past 240 hour(s)).       Radiology Studies: Dg Chest 2 View  Result Date: 02/05/2017 CLINICAL DATA:  Back pain EXAM: CHEST  2 VIEW COMPARISON:  10/26/2016 FINDINGS: Bilateral lower lobe scarring/ atelectasis. No pleural effusion or pneumothorax. The heart is normal in size. Situs inversus. Postsurgical changes related to prior CABG. Median sternotomy. Thoracolumbar spine is described on dedicated radiographs. IVC filter. IMPRESSION: Thoracolumbar spine is described on dedicated radiographs. No evidence of acute cardiopulmonary disease. Electronically Signed   By: 10/28/2016 M.D.   On: 02/05/2017 20:31   Dg Thoracic Spine 2 View  Result Date: 02/05/2017 CLINICAL DATA:  Back pain, recent vertebroplasty EXAM: THORACIC SPINE 2 VIEWS COMPARISON:  MRI thoracic spine dated  12/31/2016 FINDINGS: Prior vertebral augmentation at T12. Mild degenerative changes of the thoracic spine. New superior endplate compression fracture deformity at L1 with approximately 30% loss of height. No retropulsion. IMPRESSION: New superior endplate compression fracture deformity at L1 with approximately 30% loss of height. No retropulsion. Prior vertebral augmentation at T12. Electronically Signed   By: 01/02/2017 M.D.   On: 02/05/2017 20:30   Dg Lumbar Spine 2-3 Views  Result Date: 02/05/2017 CLINICAL DATA:  Severe back pain, recent vertebroplasty EXAM: LUMBAR SPINE - 2-3 VIEW COMPARISON:  None. FINDINGS: Prior vertebral augmentation at T12. New superior endplate compression fracture deformity at L1 with approximately 30% loss of height. No retropulsion. Mild degenerative changes of the upper lumbar spine. IVC filter. IMPRESSION: New superior endplate compression fracture deformity at L1 with approximately 30% loss of height. No retropulsion. Electronically Signed   By: 04/07/2017 M.D.   On: 02/05/2017 20:32   Ct Angio Chest Pe W And/or Wo Contrast  Result Date: 02/05/2017 CLINICAL DATA:  Back pain with recent surgery EXAM: CT ANGIOGRAPHY CHEST WITH CONTRAST TECHNIQUE: Multidetector CT imaging of the chest was performed using the standard protocol during bolus administration of intravenous contrast. Multiplanar CT image reconstructions and MIPs were obtained to evaluate the vascular anatomy. CONTRAST:  100 mL Isovue 370 intravenous COMPARISON:  Radiograph 02/05/2017, CT chest 05/26/2013, CT 12/30/2016 FINDINGS: Cardiovascular: Satisfactory opacification of the pulmonary arteries to the segmental level. No evidence of pulmonary embolism. Atherosclerotic calcifications of the aorta. No aneurysmal dilatation. Right-sided aortic arch with mirror image branching of the great vessels. No dissection is seen. Dextrocardia. Coronary artery calcification. No large pericardial effusion.  Mediastinum/Nodes: Midline trachea. No thyroid. No significantly enlarged lymph nodes. Distal hiatal hernia. Lungs/Pleura: Bronchiectasis in the bilateral lower lobes with bronchial thickening and consolidation. Scattered ground-glass foci in the left upper lobe with mild tree-in-bud nodularity within the bilateral upper lobes and left greater than right lower lobes. No pleural effusion. No pneumothorax. Upper Abdomen:  Situs in versus.  Pneumobilia re- demonstrated. Musculoskeletal: Sternotomy changes. No acute or suspicious bone lesion Review of the MIP images confirms the  above findings. IMPRESSION: 1. Negative for acute pulmonary embolus or aortic dissection 2. 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 Dextrocardia. 3. Bilateral lower lobe bronchiectasis with chronic areas of consolidation and bronchial thickening. Scattered ground-glass foci and tree-in-bud nodularity within the upper and lower lobes, compatible with respiratory infection, consider atypical organisms. 4. Situs  inversus.  Pneumobilia, postsurgical Aortic Atherosclerosis (ICD10-I70.0). Electronically Signed   By: Jasmine Pang M.D.   On: 02/05/2017 23:02      Scheduled Meds: . allopurinol  200 mg Oral Daily  . aspirin  81 mg Oral Daily  . collagenase   Topical Daily  . gabapentin  400 mg Oral BID  . insulin aspart  0-9 Units Subcutaneous TID WC  . [START ON 02/11/2017] methotrexate  20 mg Oral Q Mon  . metoprolol tartrate  5 mg Intravenous Once  . metoprolol tartrate  50 mg Oral BID  . pantoprazole sodium  40 mg Oral Daily  . simvastatin  20 mg Oral QPM  . Warfarin - Pharmacist Dosing Inpatient   Does not apply q1800   Continuous Infusions: . doxycycline (VIBRAMYCIN) IV Stopped (02/06/17 0800)     LOS: 0 days    Time spent in minutes: 35    Calvert Cantor, MD [facility] Pager: [email or web address] Password [email or web address] 02/06/2017, 1:22 PM

## 2017-02-06 NOTE — H&P (Addendum)
History and Physical    [name] SWN:[id number] DOB: 09/09/1940 DOA: 02/05/2017  PCP: Elias Else, MD   Patient coming from: Home  Chief Complaint: Back Pain.  HPI: [name] is a 76 y.o. 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  Patient presented today with back pain of 3 days' duration. Patient denies falls, or trauma to back. Patient reports after surgery about 2 months ago she was sent to rehabilitation, back pain continued to improve until 3 days ago. Patient denies fever or chills. Endorses some mild intermittent nausea, but no vomiting.   Patient reports no pain or discharge from incision for recent kyphoplasty, reports area has healed well.  Patient reports pain in in her buttock area, with discharge, from a wound on her buttocks, which developed after rehabilitation stay. Patient was started on a 7 day course of doxycycline for this, which she has taken for 4 days and Patient reports some improvement in pain, but still with discharge. Patient endorses chronic dry cough which is now worse, but still nonproductive. No change in difficulty breathing, on chronic 2 L of oxygen.   ED Course: Temperature normal at 97.9, tachycardic heart rates in the 110s to 120s. O2 sats 98% on nasal cannula. Lab work revealed WBC elevated at 18.4, hemoglobin stable at 12.1, normal electrolytes creatinine close to baseline 1.2. BNP normal at 39.5. Patient had thoracic spine x-ray which showed new superior endplate compression fracture deformity at L1. Due to concern for PE with back pain and patient's history, with INR subtherapeutic at 1.34, CT angiogram chest was done-negative for PE or aortic dissection, but showed bilateral lower lobe bronchiectasis with chronic consolidation, scattered  groundglass foci, and tree-in-bud nodularity , compatible with respiratory infection consider atypical organisms.  Patient was given Dilaudid and fentanyl in the ED.  Review of Systems:  CONSTITUTIONAL- No Fever, endorses recent poor po intake  SKIN- redness buttock region. HEAD- No Headache or dizziness. Mouth/throat- No Sorethroat, or bleeding gums. CARDIAC- No PND or chest pain. GI- No nausea, vomiting, diarrhoea, constipation, abd pain. URINARY- No Frequency, urgency, straining or dysuria. NEUROLOGIC- No Numbness, syncope, seizures or burning. [facility]- Denies depression or anxiety.  Past Medical History:  Diagnosis Date  . Allergy   . Anemia   . Arthritis   . Blood transfusion without reported diagnosis   . CAD (coronary artery disease)    NSTEMI in setting of gallstone pancreatitis 05/2011:  LHC with 3v CAD; CABG was performed 06/14/11: RIMA-LAD, SVG-ramus, SVG-RCA  . Carotid stenosis    Dopplers 06/12/11: LICA 60-79%.  . Cataract   . CHF (congestive heart failure) (HCC)   . Chronic bronchitis   . Chronic kidney disease   . Chronic systolic heart failure (HCC)   . Collagen vascular disease (HCC)   . Dextrocardia   . Diabetes mellitus without complication (HCC)   . DVT of leg (deep venous thrombosis) (HCC) ~2006   left  . Fracture 05/24/2011   right; "did not have surgery"  . GERD (gastroesophageal reflux disease)   . H/O hiatal hernia   . HLD (hyperlipidemia)   . Hypertension   . Ischemic cardiomyopathy    Echocardiogram 06/05/11: EF 40-45%, anteroseptal hypokinesis, apical hypokinesis, mild LAE  . Myocardial infarction (HCC)   . Neuromuscular disorder (HCC)   . Neuropathy   . Osteoporosis   . Oxygen deficiency  2l  . Pneumonia 06/13/11   "couple times; long time ago"  . Pulmonary embolus [facility]) March 2013  . Rheumatoid arthritis (HCC)   . Situs inversus with dextrocardia    CT 06/2011: situs inversus totalis.  What would typically be called RCA arose from anterior  sinus of Valsalva.  What would typically be called the left main arises from the posterior sinus of Valsalva and gives rise to a large first diagonal branch and diminutive circumflex    Past Surgical History:  Procedure Laterality Date  . ARCH AORTOGRAM Right 06/11/2011   Procedure: ARCH AORTOGRAM;  Surgeon: Kathleene Hazel, MD;  Location[facility] CATH LAB;  Service: Cardiovascular;  Laterality: Right;  . CARDIAC CATHETERIZATION  06/11/11  . CHOLECYSTECTOMY    . CORONARY ARTERY BYPASS GRAFT  06/14/2011   Procedure: CORONARY ARTERY BYPASS GRAFTING (CABG);  Surgeon: Alleen Borne, MD;  Location: [facility] OR;  Service: Open Heart Surgery;  Laterality: N/A;  . ERCP  06/03/2011   Procedure: ENDOSCOPIC RETROGRADE CHOLANGIOPANCREATOGRAPHY (ERCP);  Surgeon: Petra Kuba, MD;  Location: [facility] OR;  Service: Endoscopy;  Laterality: N/A;  . KYPHOPLASTY N/A 01/02/2017   Procedure: KYPHOPLASTY L2;  Surgeon: Kennedy Bucker, MD;  Location: [facility] ORS;  Service: Orthopedics;  Laterality: N/A;  . LEFT HEART CATHETERIZATION WITH CORONARY ANGIOGRAM N/A 06/11/2011   Procedure: LEFT HEART CATHETERIZATION WITH CORONARY ANGIOGRAM;  Surgeon: Kathleene Hazel, MD;  Location: [facility] CATH LAB;  Service: Cardiovascular;  Laterality: N/A;  . VAGINAL HYSTERECTOMY  1970's     reports that she has never smoked. She has never used smokeless tobacco. She reports that she does not drink alcohol or use drugs.  Allergies  Allergen Reactions  . Penicillins Rash    Has patient had a PCN reaction causing immediate rash, facial/tongue/throat swelling, SOB or lightheadedness with hypotension: Yes Has patient had a PCN reaction causing severe rash involving mucus membranes or skin necrosis: No Has patient had a PCN reaction that required hospitalization: No Has patient had a PCN reaction occurring within the last 10 years: No If all of the above answers are "NO", then may proceed with Cephalosporin use.   . Shellfish Allergy Anaphylaxis  .  Hydrocodone Other (See Comments)    Pt says "it's too high powered so it sends me out to space"  . Oxycodone Other (See Comments)    Pt says "it's too high powered so it sends me out to space"  . Levaquin [Levofloxacin] Hives    Family History  Problem Relation Age of Onset  . Heart disease Father   . Heart disease Sister    Prior to Admission medications   Medication Sig Start Date End Date Taking? Authorizing Provider  acetaminophen (TYLENOL) 160 MG/5ML elixir Take 320 mg by mouth every 6 (six) hours.   Yes [provider]  allopurinol (ZYLOPRIM) 100 MG tablet Take 200 mg by mouth daily.    Yes [provider]  aspirin EC 81 MG tablet Take 81 mg by mouth daily.   Yes [provider]  cholecalciferol (VITAMIN D) 1000 UNITS tablet Take 1,000 Units by mouth daily.   Yes [provider]  doxycycline (VIBRAMYCIN) 100 MG capsule Take 100 mg by mouth 2 (two) times daily. Has 4 days left 02/01/17  Yes [provider]  folic acid (FOLVITE) 800 MCG tablet Take 800 mcg by mouth daily.    Yes [provider]  furosemide (LASIX) 40 MG tablet Take 1 tablet (40 mg total) by mouth  daily. 08/29/16 02/05/17 Yes Kathleene Hazel, MD  gabapentin (NEURONTIN) 300 MG capsule Take 300 mg by mouth 2 (two) times daily.     Yes [provider]  ipratropium-albuterol (DUONEB) 0.5-2.5 (3) MG/3ML SOLN Take 3 mLs by nebulization every 6 (six) hours as needed. Patient taking differently: Take 3 mLs by nebulization every 6 (six) hours as needed (wheezing/sob).  11/14/16  Yes Merwyn Katos, MD  metFORMIN (GLUCOPHAGE-XR) 500 MG 24 hr tablet Take 500 mg by mouth daily as needed (low blood sugar).  10/31/16  Yes [provider]  methotrexate (RHEUMATREX) 2.5 MG tablet Take 20 mg by mouth every Monday.    Yes [provider]  metoprolol tartrate (LOPRESSOR) 25 MG tablet Take 25 mg by mouth 2 (two) times daily.   Yes [provider]    omeprazole (PRILOSEC) 20 MG capsule Take 20 mg by mouth every morning.     Yes [provider]  simvastatin (ZOCOR) 20 MG tablet Take 20 mg by mouth every evening.    Yes [provider]  warfarin (COUMADIN) 2 MG tablet Take as directed by [facility] Patient taking differently: Take 2-4 mg by mouth See admin instructions. Take 2mg  on Sat/Sun and 4mg  on Mon-Fri. 10/17/16  Yes , MD  alendronate (FOSAMAX) 70 MG tablet Take 70 mg by mouth every Sunday.     [provider]  Respiratory Therapy Supplies (FLUTTER) DEVI Use 10-15 times daily 09/10/16   Friday, MD   Physical Exam: Vitals:   02/06/17 0015 02/06/17 0030 02/06/17 0100 02/06/17 0115  BP: 127/74 (!) 142/69 (!) 147/81 118/76  Pulse: (!) 111 (!) 110 (!) 125 (!) 117  Resp: 18 18 (!) 22 (!) 23  Temp:      TempSrc:      SpO2: 100% 99% 95% 99%    Constitutional: NAD, calm, comfortable, O2 by nasal cannula Vitals:   02/06/17 0015 02/06/17 0030 02/06/17 0100 02/06/17 0115  BP: 127/74 (!) 142/69 (!) 147/81 118/76  Pulse: (!) 111 (!) 110 (!) 125 (!) 117  Resp: 18 18 (!) 22 (!) 23  Temp:      TempSrc:      SpO2: 100% 99% 95% 99%   Eyes: PERRL, lids and conjunctivae normal ENMT: Mucous membranes are moist. Posterior pharynx clear of any exudate or lesions.Normal dentition.  Neck: normal, supple, no masses, no thyromegaly Respiratory: Crackles bilateral lower lungs. Normal respiratory effort. No accessory muscle use.  Cardiovascular: Regular rate and rhythm, no murmurs / rubs / gallops.  2+ pedal pulses.  Abdomen: no tenderness, no masses palpated. No hepatosplenomegaly. Bowel sounds positive.  Musculoskeletal: no clubbing / cyanosis. No joint deformity upper and lower extremities. Good ROM, no contractures. Normal muscle tone. Chronic reddish discoloration lower leg to ankle, no tenderness with differential warmth- unchanged, the patient from prior DVT.  Skin: Large  Erythematous area sacrum at least ~8 by ~8cm, With stage II ulcers -2 small ulcers measuring approximately ~1 by ~1 cm, extending to both Buttocks Also slight discoloration left lower extremity. With brownish discharge and with some purulence on dressing. No fluctuance appreciated.  Neurologic: CN 2-12 grossly intact. Sensation intact, DTR normal. Strength 5/5 in all 4.  Psychiatric: Normal judgment and insight. Alert and oriented x 3. Normal mood.   Labs on Admission: I have personally reviewed following labs and imaging studies  CBC:  Recent Labs Lab 02/05/17 1715  WBC 18.4*  HGB 12.1  HCT 37.9  MCV 101.6*  PLT 402*   Basic Metabolic Panel:  Recent Labs Lab 02/05/17 1707 02/05/17 1715  NA  --  138  K  --  3.6  CL  --  97*  CO2  --  28  GLUCOSE  --  145*  BUN  --  25*  CREATININE  --  1.23*  CALCIUM  --  9.3  MG 1.6*  --    Coagulation Profile:  Recent Labs Lab 02/05/17 02/05/17 1707  INR 1.3 1.34   Cardiac Enzymes:  Recent Labs Lab 02/05/17 1707  TROPONINI <0.03   Urine analysis:    Component Value Date/Time   COLORURINE YELLOW 02/06/2017 0000   APPEARANCEUR CLEAR 02/06/2017 0000   APPEARANCEUR Hazy 05/25/2013 1613   LABSPEC 1.031 (H) 02/06/2017 0000   LABSPEC 1.009 05/25/2013 1613   PHURINE 6.0 02/06/2017 0000   GLUCOSEU NEGATIVE 02/06/2017 0000   GLUCOSEU Negative 05/25/2013 1613   HGBUR NEGATIVE 02/06/2017 0000   BILIRUBINUR NEGATIVE 02/06/2017 0000   BILIRUBINUR Negative 05/25/2013 1613   KETONESUR 5 (A) 02/06/2017 0000   PROTEINUR NEGATIVE 02/06/2017 0000   UROBILINOGEN 1.0 08/12/2011 2028   NITRITE NEGATIVE 02/06/2017 0000   LEUKOCYTESUR NEGATIVE 02/06/2017 0000   LEUKOCYTESUR 1+ 05/25/2013 1613    Radiological Exams on Admission: Dg Chest 2 View  Result Date: 02/05/2017 CLINICAL DATA:  Back pain EXAM: CHEST  2 VIEW COMPARISON:  10/26/2016 FINDINGS: Bilateral lower lobe scarring/ atelectasis. No pleural effusion or pneumothorax. The heart  is normal in size. Situs inversus. Postsurgical changes related to prior CABG. Median sternotomy. Thoracolumbar spine is described on dedicated radiographs. IVC filter. IMPRESSION: Thoracolumbar spine is described on dedicated radiographs. No evidence of acute cardiopulmonary disease. Electronically Signed   By: Charline Bills M.D.   On: 02/05/2017 20:31   Dg Thoracic Spine 2 View  Result Date: 02/05/2017 CLINICAL DATA:  Back pain, recent vertebroplasty EXAM: THORACIC SPINE 2 VIEWS COMPARISON:  MRI thoracic spine dated 12/31/2016 FINDINGS: Prior vertebral augmentation at T12. Mild degenerative changes of the thoracic spine. New superior endplate compression fracture deformity at L1 with approximately 30% loss of height. No retropulsion. IMPRESSION: New superior endplate compression fracture deformity at L1 with approximately 30% loss of height. No retropulsion. Prior vertebral augmentation at T12. Electronically Signed   By: Charline Bills M.D.   On: 02/05/2017 20:30   Dg Lumbar Spine 2-3 Views  Result Date: 02/05/2017 CLINICAL DATA:  Severe back pain, recent vertebroplasty EXAM: LUMBAR SPINE - 2-3 VIEW COMPARISON:  None. FINDINGS: Prior vertebral augmentation at T12. New superior endplate compression fracture deformity at L1 with approximately 30% loss of height. No retropulsion. Mild degenerative changes of the upper lumbar spine. IVC filter. IMPRESSION: New superior endplate compression fracture deformity at L1 with approximately 30% loss of height. No retropulsion. Electronically Signed   By: Charline Bills M.D.   On: 02/05/2017 20:32   Ct Angio Chest Pe W And/or Wo Contrast  Result Date: 02/05/2017 CLINICAL DATA:  Back pain with recent surgery EXAM: CT ANGIOGRAPHY CHEST WITH CONTRAST TECHNIQUE: Multidetector CT imaging of the chest was performed using the standard protocol during bolus administration of intravenous contrast. Multiplanar CT image reconstructions and MIPs were obtained to  evaluate the vascular anatomy. CONTRAST:  100 mL Isovue 370 intravenous COMPARISON:  Radiograph 02/05/2017, CT chest 05/26/2013, CT 12/30/2016 FINDINGS: Cardiovascular: Satisfactory opacification of the pulmonary arteries to the segmental level. No evidence of pulmonary embolism. Atherosclerotic calcifications of the aorta. No aneurysmal dilatation. Right-sided aortic arch with mirror image branching of  the great vessels. No dissection is seen. Dextrocardia. Coronary artery calcification. No large pericardial effusion. Mediastinum/Nodes: Midline trachea. No thyroid. No significantly enlarged lymph nodes. Distal hiatal hernia. Lungs/Pleura: Bronchiectasis in the bilateral lower lobes with bronchial thickening and consolidation. Scattered ground-glass foci in the left upper lobe with mild tree-in-bud nodularity within the bilateral upper lobes and left greater than right lower lobes. No pleural effusion. No pneumothorax. Upper Abdomen:  Situs in versus.  Pneumobilia re- demonstrated. Musculoskeletal: Sternotomy changes. No acute or suspicious bone lesion Review of the MIP images confirms the above findings. IMPRESSION: 1. Negative for acute pulmonary embolus or aortic dissection 2. 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 Dextrocardia. 3. Bilateral lower lobe bronchiectasis with chronic areas of consolidation and bronchial thickening. Scattered ground-glass foci and tree-in-bud nodularity within the upper and lower lobes, compatible with respiratory infection, consider atypical organisms. 4. Situs  inversus.  Pneumobilia, postsurgical Aortic Atherosclerosis (ICD10-I70.0). Electronically Signed   By: Jasmine Pang M.D.   On: 02/05/2017 23:02    EKG: Irregular, rate is 121, up to 200 on one EKG. P waves intermittently present, appears to be of varying morphology. Few PVCs.  Assessment/Plan Active Problems:   Chronic systolic heart failure (HCC)   CAD    Saddle embolus of pulmonary artery (HCC)   Acute back pain   Rheumatoid arthritis (HCC)   Decubital ulcer  Acute back pain- lumbar x-ray shows new L1, compression fracture. Recent kyphoplasty 01/02/17, with well healed surgical sites. - Will need Neurosurgery recommendations for new fracture.  - IV morphine - cont home fosamax - Pt eval  Decubitus ulcers- with discharge- brownish and creamy. WBC elevated at 18. Patient also tachycardic. She was started on doxycycline 4 days ago. Patient meets SIRs criteria.  - Lactic acid- 1. - Will start broad-spectrum antibiotic with IV meropenem (Zosyn allergy noted), considering DM and recent hospital admission, narrow following clinical course and blood cultures - Continue doxycycline, IV, for atypical lung infection and possible MRSA infection - Wound care consult - Follow-up blood cultures x 2 drawn before antibiotics inpatient.  Irregular heart rate- EKG shows intermittent P waves, and when present of varying morphology. Rates 109- 125, up to 200 on EKG. Likely multifocal atrial tachycardia versus atrial fibrillation. Patient reports history of fast heart rate. - Consider consulting cardiology a.m. - Increase home metoprolol to 50 mg BID - On anticoagulation with warfarin subtherapeutic  Bronchiectasis, atypical lung infection- with history of situs inversus. CT chest was done to rule out PE showed bronchiectasis and findings consistent with atypical lung infection WBC elevated at 18. No fevers or chills. Lactic acid- 1. - Will continue doxycycline for atypical lung infection (levaquin allergy) - Also On IV meropenem to cover decubitus ulcers (Zosyn allergy noted). - Chest physiotherapy  History of PE and DVT- on chronic anticoagulation with warfarin. INR- 1.3. CTA chest negative for PE.  - Continue anticoagulation with warfarin per pharmacy - Patient says she has not been evaluated for other options for anticoagulation - Might benefit from NOAC,  instead of warfarin  Rheumatoid arthritis- stable . - continue home methotrexate.  DM- CBG 145. Hold home metformin - SSI.  Chronic systolic CHF- stable. BNp- 39. EF 55-60%- 08/2016, G1DD. Improved from Echo 06/2011 EF 40-45%. - Continue home Lasix 40 mg daily - Increase dose of metoprolol to 50 mg twice a day   DVT prophylaxis: Warfarin  Code Status: Full Family Communication: Sister at bedside Disposition Plan: To be  determined Consults called: Consider cardiology consult a.m. for  Admission status: Telemetry   Onnie Boer MD [facility] Pager [phone]  If 7PM-7AM, please contact night-coverage [email or web address] Password [email or web address]  02/06/2017, 1:39 AM

## 2017-02-06 NOTE — ED Notes (Signed)
Unsuccessful attempt to draw second blood culture. Will call phlebotomy.

## 2017-02-06 NOTE — Progress Notes (Signed)
At 0800, received call from central telemetry that pt's HR was >200. RN in to check on patient; HR in 220s, patient resting in bed and asymptomatic. BP 93/74. Patient's HR back down to 115 before EKG could be obtained. Dr. Butler Denmark was notified. Orders received for 500 cc bolus and to give patient's PO metoprolol early.

## 2017-02-06 NOTE — Progress Notes (Signed)
RT attempted to perform chest PT to patient. Patient states she is in too much pain to do chest PT. Attempted to coach her into trying chest PT but patient refused.

## 2017-02-06 NOTE — Consult Note (Signed)
WOC Nurse wound consult note Reason for Consult: sacrum  Patient reports recent stay in rehab at [facility] where she reported she had "sore on my butt" there.  Wound type: Stage 1 pressure injury with superimposed Stage 3 areas right and left buttocks Pressure Injury POA: Yes Measurement: Stage 1 area 8cm x 10cm x 0cm with two areas noted upper right and left buttock 0.5cm x 0.5cm and 0.3cm x 0.2cm x 0.2cm  Wound bed: open areas are 100% yellow, other area 100% red non blanchable skin  Drainage (amount, consistency, odor) creamy discharge in the apex of the gluteal fold, may be barrier cream Periwound:intact  Dressing procedure/placement/frequency: Add enzymatic debridement ointment to the open areas, cover with saline moist gauze. Change daily. LALM for moisture management and pressure redistribution. Maximize nutrition for wound healing.  Discussed POC with patient and bedside nurse.  Re consult if needed, will not follow at this time. Thanks  Shanan Fitzpatrick [employer], RN,CWOCN, CNS, CWON-AP [phone])

## 2017-02-07 ENCOUNTER — Encounter (HOSPITAL_COMMUNITY): Payer: Self-pay | Admitting: General Practice

## 2017-02-07 DIAGNOSIS — R52 Pain, unspecified: Secondary | ICD-10-CM | POA: Diagnosis not present

## 2017-02-07 DIAGNOSIS — I5022 Chronic systolic (congestive) heart failure: Secondary | ICD-10-CM

## 2017-02-07 DIAGNOSIS — J449 Chronic obstructive pulmonary disease, unspecified: Secondary | ICD-10-CM | POA: Diagnosis not present

## 2017-02-07 DIAGNOSIS — S32010A Wedge compression fracture of first lumbar vertebra, initial encounter for closed fracture: Secondary | ICD-10-CM | POA: Diagnosis not present

## 2017-02-07 DIAGNOSIS — N183 Chronic kidney disease, stage 3 (moderate): Secondary | ICD-10-CM | POA: Diagnosis not present

## 2017-02-07 DIAGNOSIS — I2510 Atherosclerotic heart disease of native coronary artery without angina pectoris: Secondary | ICD-10-CM | POA: Diagnosis not present

## 2017-02-07 DIAGNOSIS — J984 Other disorders of lung: Secondary | ICD-10-CM | POA: Diagnosis not present

## 2017-02-07 DIAGNOSIS — Q240 Dextrocardia: Secondary | ICD-10-CM | POA: Diagnosis not present

## 2017-02-07 DIAGNOSIS — M545 Low back pain: Secondary | ICD-10-CM | POA: Diagnosis not present

## 2017-02-07 DIAGNOSIS — I471 Supraventricular tachycardia: Secondary | ICD-10-CM | POA: Diagnosis not present

## 2017-02-07 DIAGNOSIS — J9601 Acute respiratory failure with hypoxia: Secondary | ICD-10-CM | POA: Diagnosis not present

## 2017-02-07 DIAGNOSIS — M4850XD Collapsed vertebra, not elsewhere classified, site unspecified, subsequent encounter for fracture with routine healing: Secondary | ICD-10-CM | POA: Diagnosis not present

## 2017-02-07 DIAGNOSIS — J9611 Chronic respiratory failure with hypoxia: Secondary | ICD-10-CM | POA: Diagnosis not present

## 2017-02-07 DIAGNOSIS — J479 Bronchiectasis, uncomplicated: Secondary | ICD-10-CM | POA: Diagnosis not present

## 2017-02-07 LAB — GLUCOSE, CAPILLARY
GLUCOSE-CAPILLARY: 118 mg/dL — AB (ref 65–99)
Glucose-Capillary: 111 mg/dL — ABNORMAL HIGH (ref 65–99)
Glucose-Capillary: 104 mg/dL — ABNORMAL HIGH (ref 65–99)
Glucose-Capillary: 133 mg/dL — ABNORMAL HIGH (ref 65–99)

## 2017-02-07 LAB — PROTIME-INR
INR: 1.49
Prothrombin Time: 17.9 seconds — ABNORMAL HIGH (ref 11.4–15.2)

## 2017-02-07 LAB — BASIC METABOLIC PANEL
ANION GAP: 8 (ref 5–15)
BUN: 16 mg/dL (ref 6–20)
CALCIUM: 8.7 mg/dL — AB (ref 8.9–10.3)
CO2: 32 mmol/L (ref 22–32)
Chloride: 97 mmol/L — ABNORMAL LOW (ref 101–111)
Creatinine, Ser: 0.96 mg/dL (ref 0.44–1.00)
GFR calc Af Amer: >60 mL/min (ref >60)
GFR calc non Af Amer: 57 mL/min — ABNORMAL LOW (ref >60)
GLUCOSE: 107 mg/dL — AB (ref 65–99)
Potassium: 4 mmol/L (ref 3.5–5.1)
Sodium: 137 mmol/L (ref 135–145)

## 2017-02-07 LAB — TSH: TSH: 5.256 u[IU]/mL — ABNORMAL HIGH (ref 0.350–4.500)

## 2017-02-07 LAB — CBC
HEMATOCRIT: 35 % — AB (ref 36.0–46.0)
Hemoglobin: 11 g/dL — ABNORMAL LOW (ref 12.0–15.0)
MCH: 32.3 pg (ref 26.0–34.0)
MCHC: 31.4 g/dL (ref 30.0–36.0)
MCV: 102.6 fL — AB (ref 78.0–100.0)
PLATELETS: 370 10*3/uL (ref 150–400)
RBC: 3.41 MIL/uL — ABNORMAL LOW (ref 3.87–5.11)
RDW: 18.2 % — AB (ref 11.5–15.5)
WBC: 12.7 10*3/uL — AB (ref 4.0–10.5)

## 2017-02-07 LAB — MAGNESIUM: Magnesium: 2.1 mg/dL (ref 1.7–2.4)

## 2017-02-07 LAB — T4, FREE: FREE T4: 1.6 ng/dL — AB (ref 0.61–1.12)

## 2017-02-07 MED ORDER — METOPROLOL TARTRATE 25 MG PO TABS
25.0000 mg | ORAL_TABLET | Freq: Two times a day (BID) | ORAL | Status: DC
  Administered 2017-02-07 – 2017-02-09 (×3): 25 mg via ORAL
  Filled 2017-02-07 (×4): qty 1

## 2017-02-07 MED ORDER — ENSURE ENLIVE PO LIQD
237.0000 mL | Freq: Two times a day (BID) | ORAL | Status: DC
  Administered 2017-02-07 – 2017-02-09 (×4): 237 mL via ORAL

## 2017-02-07 MED ORDER — WARFARIN SODIUM 2 MG PO TABS
2.0000 mg | ORAL_TABLET | Freq: Once | ORAL | Status: AC
  Administered 2017-02-07: 2 mg via ORAL
  Filled 2017-02-07: qty 1

## 2017-02-07 MED ORDER — HYDROMORPHONE HCL 2 MG PO TABS
1.0000 mg | ORAL_TABLET | ORAL | Status: DC
  Administered 2017-02-07 – 2017-02-09 (×11): 1 mg via ORAL
  Filled 2017-02-07 (×11): qty 1

## 2017-02-07 MED ORDER — SODIUM CHLORIDE 0.9 % IV BOLUS (SEPSIS)
1000.0000 mL | Freq: Once | INTRAVENOUS | Status: AC
  Administered 2017-02-07: 1000 mL via INTRAVENOUS

## 2017-02-07 NOTE — Progress Notes (Addendum)
Attempted to make appointment with Dr Rosita Kea.  Per appointment scheduler [facility]) someone from office will have to call me to arrange.    followup scheduled for Monday 02/11/2017 at 0930.

## 2017-02-07 NOTE — Progress Notes (Addendum)
Progress Note  Patient Name: Valor Hueber Brusseau Date of Encounter: 02/07/2017  Primary Cardiologist: Dr Clifton James  Subjective   Back pain improved this AM, no chest pain, no sob  Inpatient Medications    Scheduled Meds: . allopurinol  200 mg Oral Daily  . aspirin  81 mg Oral Daily  . collagenase   Topical Daily  . doxycycline  100 mg Oral Q12H  . gabapentin  400 mg Oral BID  . insulin aspart  0-9 Units Subcutaneous TID WC  . [START ON 02/11/2017] methotrexate  20 mg Oral Q Mon  . metoprolol tartrate  5 mg Intravenous Once  . metoprolol tartrate  50 mg Oral TID  . pantoprazole sodium  40 mg Oral Daily  . simvastatin  20 mg Oral QPM  . Warfarin - Pharmacist Dosing Inpatient   Does not apply q1800   Continuous Infusions:  PRN Meds: HYDROmorphone, ipratropium-albuterol, morphine injection, ondansetron **OR** ondansetron (ZOFRAN) IV, polyethylene glycol   Vital Signs    Vitals:   02/06/17 1300 02/06/17 1753 02/06/17 1951 02/07/17 0536  BP: (!) 94/48 112/75 100/79 108/62  Pulse:  (!) 103 90 87  Resp: (!) 21  (!) 24 19  Temp: 97.9 F (36.6 C)  98.2 F (36.8 C) 98 F (36.7 C)  TempSrc: Oral  Oral Oral  SpO2: 100%  100% 95%  Weight: 163 lb (73.9 kg)   154 lb (69.9 kg)  Height: 5\' 2"  (1.575 m)       Intake/Output Summary (Last 24 hours) at 02/07/17 0944 Last data filed at 02/07/17 0800  Gross per 24 hour  Intake              900 ml  Output             1300 ml  Net             -400 ml   Filed Weights   02/06/17 1300 02/07/17 0536  Weight: 163 lb (73.9 kg) 154 lb (69.9 kg)    Telemetry    No further SVT.  - Personally Reviewed  ECG    NSR - Personally Reviewed  Physical Exam   GEN: No distress.   Neck: No JVD Cardiac: RRR, no murmurs, rubs, or gallops. Heart sounds on right side Respiratory: Clear to auscultation bilaterally. GI: Soft, nontender, non-distended  MS: No edema; No deformity. Neuro:  Nonfocal  Psych: Normal affect   Labs     Chemistry Recent Labs Lab 02/05/17 1715  NA 138  K 3.6  CL 97*  CO2 28  GLUCOSE 145*  BUN 25*  CREATININE 1.23*  CALCIUM 9.3  GFRNONAA 42*  GFRAA 49*  ANIONGAP 13     Hematology Recent Labs Lab 02/05/17 1715  WBC 18.4*  RBC 3.73*  HGB 12.1  HCT 37.9  MCV 101.6*  MCH 32.4  MCHC 31.9  RDW 18.0*  PLT 402*    Cardiac Enzymes Recent Labs Lab 02/05/17 1707  TROPONINI <0.03   No results for input(s): TROPIPOC in the last 168 hours.   BNP Recent Labs Lab 02/05/17 1707  BNP 39.5     DDimer No results for input(s): DDIMER in the last 168 hours.   Radiology    Dg Chest 2 View  Result Date: 02/05/2017 CLINICAL DATA:  Back pain EXAM: CHEST  2 VIEW COMPARISON:  10/26/2016 FINDINGS: Bilateral lower lobe scarring/ atelectasis. No pleural effusion or pneumothorax. The heart is normal in size. Situs inversus. Postsurgical changes related to prior  CABG. Median sternotomy. Thoracolumbar spine is described on dedicated radiographs. IVC filter. IMPRESSION: Thoracolumbar spine is described on dedicated radiographs. No evidence of acute cardiopulmonary disease. Electronically Signed   By: Charline Bills M.D.   On: 02/05/2017 20:31   Dg Thoracic Spine 2 View  Result Date: 02/05/2017 CLINICAL DATA:  Back pain, recent vertebroplasty EXAM: THORACIC SPINE 2 VIEWS COMPARISON:  MRI thoracic spine dated 12/31/2016 FINDINGS: Prior vertebral augmentation at T12. Mild degenerative changes of the thoracic spine. New superior endplate compression fracture deformity at L1 with approximately 30% loss of height. No retropulsion. IMPRESSION: New superior endplate compression fracture deformity at L1 with approximately 30% loss of height. No retropulsion. Prior vertebral augmentation at T12. Electronically Signed   By: Charline Bills M.D.   On: 02/05/2017 20:30   Dg Lumbar Spine 2-3 Views  Result Date: 02/05/2017 CLINICAL DATA:  Severe back pain, recent vertebroplasty EXAM: LUMBAR SPINE - 2-3  VIEW COMPARISON:  None. FINDINGS: Prior vertebral augmentation at T12. New superior endplate compression fracture deformity at L1 with approximately 30% loss of height. No retropulsion. Mild degenerative changes of the upper lumbar spine. IVC filter. IMPRESSION: New superior endplate compression fracture deformity at L1 with approximately 30% loss of height. No retropulsion. Electronically Signed   By: Charline Bills M.D.   On: 02/05/2017 20:32   Ct Angio Chest Pe W And/or Wo Contrast  Result Date: 02/05/2017 CLINICAL DATA:  Back pain with recent surgery EXAM: CT ANGIOGRAPHY CHEST WITH CONTRAST TECHNIQUE: Multidetector CT imaging of the chest was performed using the standard protocol during bolus administration of intravenous contrast. Multiplanar CT image reconstructions and MIPs were obtained to evaluate the vascular anatomy. CONTRAST:  100 mL Isovue 370 intravenous COMPARISON:  Radiograph 02/05/2017, CT chest 05/26/2013, CT 12/30/2016 FINDINGS: Cardiovascular: Satisfactory opacification of the pulmonary arteries to the segmental level. No evidence of pulmonary embolism. Atherosclerotic calcifications of the aorta. No aneurysmal dilatation. Right-sided aortic arch with mirror image branching of the great vessels. No dissection is seen. Dextrocardia. Coronary artery calcification. No large pericardial effusion. Mediastinum/Nodes: Midline trachea. No thyroid. No significantly enlarged lymph nodes. Distal hiatal hernia. Lungs/Pleura: Bronchiectasis in the bilateral lower lobes with bronchial thickening and consolidation. Scattered ground-glass foci in the left upper lobe with mild tree-in-bud nodularity within the bilateral upper lobes and left greater than right lower lobes. No pleural effusion. No pneumothorax. Upper Abdomen:  Situs in versus.  Pneumobilia re- demonstrated. Musculoskeletal: Sternotomy changes. No acute or suspicious bone lesion Review of the MIP images confirms the above findings. IMPRESSION:  1. Negative for acute pulmonary embolus or aortic dissection 2. 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 Dextrocardia. 3. Bilateral lower lobe bronchiectasis with chronic areas of consolidation and bronchial thickening. Scattered ground-glass foci and tree-in-bud nodularity within the upper and lower lobes, compatible with respiratory infection, consider atypical organisms. 4. Situs  inversus.  Pneumobilia, postsurgical Aortic Atherosclerosis (ICD10-I70.0). Electronically Signed   By: Jasmine Pang M.D.   On: 02/05/2017 23:02    Cardiac Studies   ECHO - dextrocardia, EF 55%  Patient Profile     76 y.o. female with PSVT, 90 seconds of narrow complex tachycardia at about 200bpm in the setting of back pain  Assessment & Plan    PSVT  - improved with metoprolol 50 TID.   - should improve as well as inflammatory process abates (back surg)  - last EF normal reassuring  - dizziness subjectively may have been from hypotension  -  would not be an ideal EP ablation candidate at this time.   - If SVT becomes a recurrent issue, may need AMIODARONE but would like to try to avoid with her chronic lung dz.   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   Situs inversus  - dextrocardia  Back pain compression fx  - L1. She states that she has not had a fall. ? Need for SPEP/UPEP   Sacral decub  - wound care  Chronic bronchiectasis  - cough.   RA  - MTX  No new recs at this time. Will sign off. Please call if ?Marland Kitchen  Signed, Donato Schultz, MD  02/07/2017, 9:44 AM

## 2017-02-07 NOTE — Progress Notes (Signed)
Physical Therapy Treatment Patient Details Name: [name] MRN: [id number] DOB: Jun 12, 1940 Today's Date: 02/07/2017    History of Present Illness Patient is a 76 y/o female who presents with back pain. Thoracic spine XRAY- New superior endplate compression fracture deformity at L1. D/ced home from rehab ~2 weeks ago from T12 kyphoplasty in August. PMH includes NSTEMI, CHF, DVT, PE, neuropathy.     PT Comments    Patient was unable to demonstrate progress during today's session, due to decreased blood pressure and increased reports of dizziness. BP readings were as follows: 72/57 in sitting, 87/58 after continued rest, and 76/59 upon standing. Patient started a new course of pain medications today. Patient did however amb. an increased distance with mobility tech before start of the session which may have also effected patients BP. Will continue to follow to maximize mobility.   Follow Up Recommendations  Home health PT;Supervision for mobility/OOB     Equipment Recommendations  None recommended by PT    Recommendations for Other Services OT consult     Precautions / Restrictions Precautions Precautions: Fall;Back Precaution Booklet Issued: No Restrictions Weight Bearing Restrictions: No    Mobility  Bed Mobility Overal bed mobility: Needs Assistance Bed Mobility: Sit to Supine;Rolling Rolling: Min guard     Sit to supine: Mod assist (patient was limited in ability to bring legs onto bed secondary to pain in back.) Sit to sidelying: Mod assist    Transfers Overall transfer level: Needs assistance Equipment used: Rolling walker (2 wheeled) Transfers: Sit to/from Stand Sit to Stand: Min guard         General transfer comment: Min guard administered for safety, stood from chair and transfered to EOB.  Ambulation/Gait Ambulation/Gait assistance: Min guard Ambulation Distance (Feet): 2 Feet (distance from chair to bed.) Assistive device: Rolling walker (2  wheeled) Gait Pattern/deviations: Trunk flexed;Wide base of support Gait velocity: decreased   General Gait Details: Patient demonstrated ability to take steps to the R with min guard, in an effort to transfer from chair to bed.   Stairs            Wheelchair Mobility    Modified Rankin (Stroke Patients Only)       Balance Overall balance assessment: Needs assistance Sitting-balance support: Feet supported;Single extremity supported Sitting balance-Leahy Scale: Fair (Due to pain in back and reluctance to maintain active extension of back) Sitting balance - Comments: Using UE support as position of comfort for back pain.   Standing balance support: During functional activity;Bilateral upper extremity supported Standing balance-Leahy Scale: Poor (patient required UE support provided by walker) Standing balance comment: patient uses walker for UE support and balance in standing. patient also maintains forward trunk flexion in standing.                            Cognition Arousal/Alertness: Awake/alert Behavior During Therapy: WFL for tasks assessed/performed Overall Cognitive Status: Within Functional Limits for tasks assessed                                        Exercises      General Comments        Pertinent Vitals/Pain Pain Assessment: Faces Faces Pain Scale: Hurts even more Pain Location: back Pain Descriptors / Indicators: Sore;Aching Pain Intervention(s): Limited activity within patient's tolerance;Monitored during session;Premedicated before session  Home Living                      Prior Function            PT Goals (current goals can now be found in the care plan section) Acute Rehab PT Goals Patient Stated Goal: To be discharged from hospital and experience decreased pain. PT Goal Formulation: With patient Time For Goal Achievement: 02/20/17 Potential to Achieve Goals: Good Progress towards PT goals: Not  progressing toward goals - comment (due to low BP)    Frequency    Min 3X/week      PT Plan Current plan remains appropriate    Co-evaluation              AM-PAC PT "6 Clicks" Daily Activity  Outcome Measure  Difficulty turning over in bed (including adjusting bedclothes, sheets and blankets)?: Unable Difficulty moving from lying on back to sitting on the side of the bed? : Unable Difficulty sitting down on and standing up from a chair with arms (e.g., wheelchair, bedside commode, etc,.)?: A Little Help needed moving to and from a bed to chair (including a wheelchair)?: A Little Help needed walking in hospital room?: A Little Help needed climbing 3-5 steps with a railing? : A Little 6 Click Score: 14    End of Session Equipment Utilized During Treatment: Gait belt;Oxygen Activity Tolerance: Patient limited by pain;Other (comment) (patient limited by low blood pressure and dizziness possibly from new pain medication) Patient left: in bed;with call bell/phone within reach Nurse Communication: Mobility status;Other (comment) (low blood pressure and dizziness throughout session.) PT Visit Diagnosis: Unsteadiness on feet (R26.81);Difficulty in walking, not elsewhere classified (R26.2);Pain     Time: 1345-1414 PT Time Calculation (min) (ACUTE ONLY): 29 min  Charges:  $Therapeutic Activity: 23-37 mins                    G Codes:       [name] SPT   [name] 02/07/2017, 3:23 PM

## 2017-02-07 NOTE — Care Management CC44 (Signed)
Condition Code 44 Documentation Completed  Patient Details  Name: [name] MRN: [id number] Date of Birth: 03/14/1941   Condition Code 44 given:  Yes Patient signature on Condition Code 44 notice:  Yes Documentation of 2 MD's agreement:  Yes Code 44 added to claim:  Yes    Gala Lewandowsky, RN 02/07/2017, 3:09 PM

## 2017-02-07 NOTE — Care Management Note (Addendum)
Case Management Note  Patient Details  Name: [name] MRN: [id number] Date of Birth: May 09, 1941  Subjective/Objective: Pt presented for Acute Back Pain. Pt is from home alone and plans to return home once stable. Pt has support of sister that visits often and her son and daughter checks in occasionally. Pt is active with [facility].                    Action/Plan: Pt would like to continue services with [facility]. Referral sent to [facility]. Pt will need resumption orders for [facility] RN/ PT Services. No further needs from CM at this time.   Expected Discharge Date:                  Expected Discharge Plan:  Home w Home Health Services  In-House Referral:  NA  Discharge planning Services  CM Consult  Post Acute Care Choice:  Home Health, Resumption of Svcs/PTA Provider Choice offered to:  Patient  DME Arranged:  N/A DME Agency:  NA  HH Arranged:  RN, PT, Aide,SW HH Agency:  [facility]  Status of Service:  Completed, signed off  If discussed at Long Length of Stay Meetings, dates discussed:    Additional Comments: 1200 02-08-17 Late Entry- Daughter did state that the patient's sister will be able to stay with her Friday and Saturday depending on day of d/c. Staff RN did ambulate the patient and 02 saturations dropped. CM will continue to follow.    1025 02-08-17 Alyssa Bamberger, RN,BSN [phone] Pt would not be a Home First Candidate. Pt will need additional orders added for CSW and tele-health program via [facility]. Pt may need SNF once she gets home and CSW can assist with that transition. Patient wants to return home at this time. CM did reach out to daughter [name] in regards to plan of care. Daughter has concerns with patient returning home and being able to function safely. Daughter to see if patient's sister can stay with her at d/c to transition to home. Pt has home 02 @ 2L via [employer], [employer] and [employer] portable tank. CM is  awaiting phone call back from daughter in regards to disposition.    Alyssa Lewandowsky, RN 02/07/2017, 3:13 PM

## 2017-02-07 NOTE — Progress Notes (Addendum)
ANTICOAGULATION CONSULT NOTE - Initial Consult  Pharmacy Consult for Coumadin Indication: h/o VTE  Allergies  Allergen Reactions  . Penicillins Rash    Has patient had a PCN reaction causing immediate rash, facial/tongue/throat swelling, SOB or lightheadedness with hypotension: Yes Has patient had a PCN reaction causing severe rash involving mucus membranes or skin necrosis: No Has patient had a PCN reaction that required hospitalization: No Has patient had a PCN reaction occurring within the last 10 years: No If all of the above answers are "NO", then may proceed with Cephalosporin use.   . Shellfish Allergy Anaphylaxis  . Hydrocodone Other (See Comments)    Pt says "it's too high powered so it sends me out to space"  . Oxycodone Other (See Comments)    Pt says "it's too high powered so it sends me out to space"  . Levaquin [Levofloxacin] Hives    Patient Measurements: Height: 5\' 2"  (157.5 cm) Weight: 154 lb (69.9 kg) IBW/kg (Calculated) : 50.1  Vital Signs: Temp: 98 F (36.7 C) (09/06 0536) Temp Source: Oral (09/06 0536) BP: 108/62 (09/06 0536) Pulse Rate: 87 (09/06 0536)  Labs:  Recent Labs  02/05/17 1707 02/05/17 1715 02/06/17 1140 02/07/17 0346  HGB  --  12.1  --   --   HCT  --  37.9  --   --   PLT  --  402*  --   --   LABPROT 16.5*  --  16.3* 17.9*  INR 1.34  --  1.32 1.49  CREATININE  --  1.23*  --   --   TROPONINI <0.03  --   --   --     Estimated Creatinine Clearance: 36.7 mL/min (A) (by C-G formula based on SCr of 1.23 mg/dL (H)).   Medical History: Past Medical History:  Diagnosis Date  . Allergy   . Anemia   . Arthritis   . Blood transfusion without reported diagnosis   . CAD (coronary artery disease)    NSTEMI in setting of gallstone pancreatitis 05/2011:  LHC with 3v CAD; CABG was performed 06/14/11: RIMA-LAD, SVG-ramus, SVG-RCA  . Carotid stenosis    Dopplers 06/12/11: LICA 60-79%.  . Cataract   . CHF (congestive heart failure) (HCC)   .  Chronic bronchitis   . Chronic kidney disease   . Chronic systolic heart failure (HCC)   . Collagen vascular disease (HCC)   . Dextrocardia   . Diabetes mellitus without complication (HCC)   . DVT of leg (deep venous thrombosis) (HCC) ~2006   left  . Fracture 05/24/2011   right; "did not have surgery"  . GERD (gastroesophageal reflux disease)   . H/O hiatal hernia   . HLD (hyperlipidemia)   . Hypertension   . Ischemic cardiomyopathy    Echocardiogram 06/05/11: EF 40-45%, anteroseptal hypokinesis, apical hypokinesis, mild LAE  . Myocardial infarction (HCC)   . Neuromuscular disorder (HCC)   . Neuropathy   . Osteoporosis   . Oxygen deficiency    2l  . Pneumonia 06/13/11   "couple times; long time ago"  . Pulmonary embolus [facility]) March 2013  . Rheumatoid arthritis (HCC)   . Situs inversus with dextrocardia    CT 06/2011: situs inversus totalis.  What would typically be called RCA arose from anterior sinus of Valsalva.  What would typically be called the left main arises from the posterior sinus of Valsalva and gives rise to a large first diagonal branch and diminutive circumflex    Medications:  No current  facility-administered medications on file prior to encounter.    Current Outpatient Prescriptions on File Prior to Encounter  Medication Sig Dispense Refill  . allopurinol (ZYLOPRIM) 100 MG tablet Take 200 mg by mouth daily.     Marland Kitchen aspirin EC 81 MG tablet Take 81 mg by mouth daily.    . cholecalciferol (VITAMIN D) 1000 UNITS tablet Take 1,000 Units by mouth daily.    . folic acid (FOLVITE) 800 MCG tablet Take 800 mcg by mouth daily.     . furosemide (LASIX) 40 MG tablet Take 1 tablet (40 mg total) by mouth daily. 90 tablet 3  . gabapentin (NEURONTIN) 300 MG capsule Take 300 mg by mouth 2 (two) times daily.      Marland Kitchen ipratropium-albuterol (DUONEB) 0.5-2.5 (3) MG/3ML SOLN Take 3 mLs by nebulization every 6 (six) hours as needed. (Patient taking differently: Take 3 mLs by nebulization  every 6 (six) hours as needed (wheezing/sob). ) 360 mL 5  . metFORMIN (GLUCOPHAGE-XR) 500 MG 24 hr tablet Take 500 mg by mouth daily as needed (low blood sugar).     . methotrexate (RHEUMATREX) 2.5 MG tablet Take 20 mg by mouth every Monday.     . metoprolol tartrate (LOPRESSOR) 25 MG tablet Take 25 mg by mouth 2 (two) times daily.    Marland Kitchen omeprazole (PRILOSEC) 20 MG capsule Take 20 mg by mouth every morning.      . simvastatin (ZOCOR) 20 MG tablet Take 20 mg by mouth every evening.     . warfarin (COUMADIN) 2 MG tablet Take as directed by [facility]mad[facility] (Patient taking differently: Take 2-4 mg by mouth See admin instructions. Take 2mg  on Sat/Sun and 4mg  on Mon-Fri.) 65 tablet 3  . alendronate (FOSAMAX) 70 MG tablet Take 70 mg by mouth every Sunday.     Respiratory Therapy Supplies (FLUTTER) DEVI Use 10-15 times daily 1 each 0    Assessment: 76 y.o. female admitted with back pain/compression fracture, h/o PE/DVT. INR was subtherapeutic at 1.34 on admission. PTA regimen (as of note from 9/4) is 2 mg daily except 4 mg on Monday and Friday.     INR today is 1.49 following 6 mg dose on 9/5. Currently on concurrent doxycycline, which may impact warfarin sensitivity. No signs/symptoms indicating bleeding in notes.   Goal of Therapy:  INR 2-3 Monitor platelets by anticoagulation protocol: Yes   Plan:  Coumadin 2 mg po x 1  Daily INR Will defer to team if want to bridge while INR is subtherapeutic since being used for history of and not active DVT  Sunday, PharmD Clinical Pharmacist  Phone: [phone] 02/07/2017,10:07 AM

## 2017-02-07 NOTE — Progress Notes (Addendum)
PROGRESS NOTE    [name]   HEN:[id number]  DOB: [birth year]  DOA: 02/05/2017 PCP: Elias Else, MD   Brief Narrative:  [name] 76 y.o. 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  She presents for back pain for 3 days with no acute trauma. Had kyphoplasty of T12 on 8/1 and back pain improved until now. Xray shows a new L1 compression fx.  WBC upt from 7.9 on 8/2 to 18.4. She has draining sacral decubitus ulcers which developed while in rehab after surgery and was stared on Doxycycline which she has taken for 4/7 days pior to admission. She has a chronic cough which is now worse but non productive.  CT chest shows:  Bilateral lower lobe bronchiectasis with chronic areas of consolidation and bronchial thickening. Scattered ground-glass foci and tree-in-bud nodularity within the upper and lower lobes,compatible with respiratory infection, consider atypical organisms.  Found to have an irregular HR with SVT in the hospital which is new.  She is admitted for pain control, management of leukocytosis and arrhythmias.  Subjective: Pain medication helps the pain but wears off- have calculated that it takes about 4-5 hrs to wear off. ROS: no complaints of nausea, vomiting, constipation diarrhea, cough, dyspnea or dysuria. No other complaints.   Assessment & Plan:   Principal Problem:   Acute back pain - L1 superior endplate fx with 30% loss of height - s/p T 12 augmentation by Dr Rosita Kea in [place] - point tenderness in L1 noted- will try pain control -  Hydrocodone and Oxycodone cause nausea in her and therefore we tried Dilaudid which she had done fine with in the ER - Q 4 Dilaudid for now except once asleep- d/w RN - cont fosamax and Vit D- will add Calcium on  discharge   Active Problems: SVT - has had 2 episodes so far despite taking home dose of Metoprolol 25 mg BID - when not in SVT, EKG was showing variating P waves - she was dehydrated per U sp gravity and so I have given her a 500 cc NS bolus - cardiology called- she see's Dr Jaye Beagle - Metoprolol increased to 50 TID per cardiology - TFTs show a mild elevation in TSH and FT4- will not treat- repeat in about 2 months  ADDENDUM: hypotensive to 80/50s after 11 AM check- she received 50 mg Metoprolol at 11 AM - will need to cut Metoprolol back to 25 for now  - give  1 L NS bolus as well    Chronic systolic heart failure  - EF 40-45% in the past but improved to normal on last ECHO - cont Metoprolol- not on ACE I - as EF is now normal, can hold off ACE I  Small decubitus ulcers - stage 3- no drainage noted- appreciate WOC eval>> Santyl and air mattress - Doxy to stop on 9/7 for 7 day course  Leukocytosis - ? Due to ulcers vs stress from pain? CT chest changes noted but she has no pulmonary symptoms - cont Doxy    CAD s/p CABG - Aspirin    DM2 (diabetes mellitus, type 2)  - hold Metformin due to CT with contrast - ISS TID    Saddle embolus of pulmonary artery  - Coumadin per pharmacy- INR subtherapeutic    Primary ciliary dyskinesia with  chronic bronchiectasis - no changes in chronic cough-  - CT changes noted but as she has no new symptoms, d/c'd Meropenem  CKD 3 - stable    Rheumatoid arthritis  - Methotrexate  Gout - Allopurinol  DVT prophylaxis: SCDs Code Status: full code Family Communication: sister Disposition Plan: to be determined Consultants:   cardiology Procedures:    Antimicrobials:  Anti-infectives    Start     Dose/Rate Route Frequency Ordered Stop   02/06/17 1800  doxycycline (VIBRA-TABS) tablet 100 mg     100 mg Oral Every 12 hours 02/06/17 1341     02/06/17 0600  meropenem (MERREM) 1 g in sodium chloride 0.9 % 100 mL IVPB  Status:   Discontinued    Comments:  Meropenem 1 g IV q12h for CrCl < 50 mL/min   1 g 200 mL/hr over 30 Minutes Intravenous Every 12 hours 02/06/17 0302 02/06/17 1318   02/06/17 0300  doxycycline (VIBRAMYCIN) 100 mg in dextrose 5 % 250 mL IVPB  Status:  Discontinued     100 mg 125 mL/hr over 120 Minutes Intravenous Every 12 hours 02/06/17 0216 02/06/17 1341   02/06/17 0215  doxycycline (VIBRAMYCIN) 100 mg in dextrose 5 % 250 mL IVPB  Status:  Discontinued     100 mg 125 mL/hr over 120 Minutes Intravenous Every 12 hours 02/06/17 0215 02/06/17 0216       Objective: Vitals:   02/06/17 1951 02/07/17 0536 02/07/17 1107 02/07/17 1113  BP: 100/79 108/62 106/61 106/61  Pulse: 90 87 97 95  Resp: (!) 24 19  20   Temp: 98.2 F (36.8 C) 98 F (36.7 C)    TempSrc: Oral Oral    SpO2: 100% 95%  99%  Weight:  69.9 kg (154 lb)    Height:        Intake/Output Summary (Last 24 hours) at 02/07/17 1245 Last data filed at 02/07/17 1100  Gross per 24 hour  Intake              960 ml  Output             1300 ml  Net             -340 ml   Filed Weights   02/06/17 1300 02/07/17 0536  Weight: 73.9 kg (163 lb) 69.9 kg (154 lb)    Examination: General exam: Appears comfortable  HEENT: PERRLA, oral mucosa moist, no sclera icterus or thrush Respiratory system: Clear to auscultation. Respiratory effort normal. Cardiovascular system: S1 & S2 heard,  No murmurs  Gastrointestinal system: Abdomen soft, non-tender, nondistended. Normal bowel sound. No organomegaly Central nervous system: Alert and oriented. No focal neurological deficits. Extremities: No cyanosis, clubbing or edema Skin: No rashes or ulcers MSK: point tenderness in L1 Psychiatry:  Mood & affect appropriate.      Data Reviewed: I have personally reviewed following labs and imaging studies  CBC:  Recent Labs Lab 02/05/17 1715 02/07/17 1109  WBC 18.4* 12.7*  HGB 12.1 11.0*  HCT 37.9 35.0*  MCV 101.6* 102.6*  PLT 402* 370   Basic  Metabolic Panel:  Recent Labs Lab 02/05/17 1707 02/05/17 1715  NA  --  138  K  --  3.6  CL  --  97*  CO2  --  28  GLUCOSE  --  145*  BUN  --  25*  CREATININE  --  1.23*  CALCIUM  --  9.3  MG 1.6*  --    GFR: Estimated  Creatinine Clearance: 36.7 mL/min (A) (by C-G formula based on SCr of 1.23 mg/dL (H)). Liver Function Tests: No results for input(s): AST, ALT, ALKPHOS, BILITOT, PROT, ALBUMIN in the last 168 hours. No results for input(s): LIPASE, AMYLASE in the last 168 hours. No results for input(s): AMMONIA in the last 168 hours. Coagulation Profile:  Recent Labs Lab 02/05/17 02/05/17 1707 02/06/17 1140 02/07/17 0346  INR 1.3 1.34 1.32 1.49   Cardiac Enzymes:  Recent Labs Lab 02/05/17 1707  TROPONINI <0.03   BNP (last 3 results) No results for input(s): PROBNP in the last 8760 hours. HbA1C: No results for input(s): HGBA1C in the last 72 hours. CBG:  Recent Labs Lab 02/06/17 1136 02/06/17 1751 02/06/17 2106 02/07/17 0753 02/07/17 1142  GLUCAP 146* 102* 110* 111* 104*   Lipid Profile: No results for input(s): CHOL, HDL, LDLCALC, TRIG, CHOLHDL, LDLDIRECT in the last 72 hours. Thyroid Function Tests:  Recent Labs  02/07/17 0346  TSH 5.256*  FREET4 1.60*   Anemia Panel: No results for input(s): VITAMINB12, FOLATE, FERRITIN, TIBC, IRON, RETICCTPCT in the last 72 hours. Urine analysis:    Component Value Date/Time   COLORURINE YELLOW 02/06/2017 0000   APPEARANCEUR CLEAR 02/06/2017 0000   APPEARANCEUR Hazy 05/25/2013 1613   LABSPEC 1.031 (H) 02/06/2017 0000   LABSPEC 1.009 05/25/2013 1613   PHURINE 6.0 02/06/2017 0000   GLUCOSEU NEGATIVE 02/06/2017 0000   GLUCOSEU Negative 05/25/2013 1613   HGBUR NEGATIVE 02/06/2017 0000   BILIRUBINUR NEGATIVE 02/06/2017 0000   BILIRUBINUR Negative 05/25/2013 1613   KETONESUR 5 (A) 02/06/2017 0000   PROTEINUR NEGATIVE 02/06/2017 0000   UROBILINOGEN 1.0 08/12/2011 2028   NITRITE NEGATIVE 02/06/2017 0000    LEUKOCYTESUR NEGATIVE 02/06/2017 0000   LEUKOCYTESUR 1+ 05/25/2013 1613   Sepsis Labs: @LABRCNTIP (procalcitonin:4,lacticidven:4) ) Recent Results (from the past 240 hour(s))  Culture, blood (routine x 2)     Status: None (Preliminary result)   Collection Time: 02/06/17  2:36 AM  Result Value Ref Range Status   Specimen Description BLOOD RIGHT HAND  Final   Special Requests IN PEDIATRIC BOTTLE Blood Culture adequate volume  Final   Culture NO GROWTH 1 DAY  Final   Report Status PENDING  Incomplete  Culture, blood (routine x 2)     Status: None (Preliminary result)   Collection Time: 02/06/17  3:40 AM  Result Value Ref Range Status   Specimen Description BLOOD LEFT ANTECUBITAL  Final   Special Requests IN PEDIATRIC BOTTLE Blood Culture adequate volume  Final   Culture NO GROWTH 1 DAY  Final   Report Status PENDING  Incomplete         Radiology Studies: Dg Chest 2 View  Result Date: 02/05/2017 CLINICAL DATA:  Back pain EXAM: CHEST  2 VIEW COMPARISON:  10/26/2016 FINDINGS: Bilateral lower lobe scarring/ atelectasis. No pleural effusion or pneumothorax. The heart is normal in size. Situs inversus. Postsurgical changes related to prior CABG. Median sternotomy. Thoracolumbar spine is described on dedicated radiographs. IVC filter. IMPRESSION: Thoracolumbar spine is described on dedicated radiographs. No evidence of acute cardiopulmonary disease. Electronically Signed   By: 10/28/2016 M.D.   On: 02/05/2017 20:31   Dg Thoracic Spine 2 View  Result Date: 02/05/2017 CLINICAL DATA:  Back pain, recent vertebroplasty EXAM: THORACIC SPINE 2 VIEWS COMPARISON:  MRI thoracic spine dated 12/31/2016 FINDINGS: Prior vertebral augmentation at T12. Mild degenerative changes of the thoracic spine. New superior endplate compression fracture deformity at L1 with approximately 30% loss of height. No retropulsion.  IMPRESSION: New superior endplate compression fracture deformity at L1 with approximately  30% loss of height. No retropulsion. Prior vertebral augmentation at T12. Electronically Signed   By: Charline Bills M.D.   On: 02/05/2017 20:30   Dg Lumbar Spine 2-3 Views  Result Date: 02/05/2017 CLINICAL DATA:  Severe back pain, recent vertebroplasty EXAM: LUMBAR SPINE - 2-3 VIEW COMPARISON:  None. FINDINGS: Prior vertebral augmentation at T12. New superior endplate compression fracture deformity at L1 with approximately 30% loss of height. No retropulsion. Mild degenerative changes of the upper lumbar spine. IVC filter. IMPRESSION: New superior endplate compression fracture deformity at L1 with approximately 30% loss of height. No retropulsion. Electronically Signed   By: Charline Bills M.D.   On: 02/05/2017 20:32   Ct Angio Chest Pe W And/or Wo Contrast  Result Date: 02/05/2017 CLINICAL DATA:  Back pain with recent surgery EXAM: CT ANGIOGRAPHY CHEST WITH CONTRAST TECHNIQUE: Multidetector CT imaging of the chest was performed using the standard protocol during bolus administration of intravenous contrast. Multiplanar CT image reconstructions and MIPs were obtained to evaluate the vascular anatomy. CONTRAST:  100 mL Isovue 370 intravenous COMPARISON:  Radiograph 02/05/2017, CT chest 05/26/2013, CT 12/30/2016 FINDINGS: Cardiovascular: Satisfactory opacification of the pulmonary arteries to the segmental level. No evidence of pulmonary embolism. Atherosclerotic calcifications of the aorta. No aneurysmal dilatation. Right-sided aortic arch with mirror image branching of the great vessels. No dissection is seen. Dextrocardia. Coronary artery calcification. No large pericardial effusion. Mediastinum/Nodes: Midline trachea. No thyroid. No significantly enlarged lymph nodes. Distal hiatal hernia. Lungs/Pleura: Bronchiectasis in the bilateral lower lobes with bronchial thickening and consolidation. Scattered ground-glass foci in the left upper lobe with mild tree-in-bud nodularity within the bilateral upper  lobes and left greater than right lower lobes. No pleural effusion. No pneumothorax. Upper Abdomen:  Situs in versus.  Pneumobilia re- demonstrated. Musculoskeletal: Sternotomy changes. No acute or suspicious bone lesion Review of the MIP images confirms the above findings. IMPRESSION: 1. Negative for acute pulmonary embolus or aortic dissection 2. 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 Dextrocardia. 3. Bilateral lower lobe bronchiectasis with chronic areas of consolidation and bronchial thickening. Scattered ground-glass foci and tree-in-bud nodularity within the upper and lower lobes, compatible with respiratory infection, consider atypical organisms. 4. Situs  inversus.  Pneumobilia, postsurgical Aortic Atherosclerosis (ICD10-I70.0). Electronically Signed   By: Jasmine Pang M.D.   On: 02/05/2017 23:02      Scheduled Meds: . allopurinol  200 mg Oral Daily  . aspirin  81 mg Oral Daily  . collagenase   Topical Daily  . doxycycline  100 mg Oral Q12H  . gabapentin  400 mg Oral BID  . HYDROmorphone  1 mg Oral Q4H  . insulin aspart  0-9 Units Subcutaneous TID WC  . [START ON 02/11/2017] methotrexate  20 mg Oral Q Mon  . metoprolol tartrate  5 mg Intravenous Once  . metoprolol tartrate  50 mg Oral TID  . pantoprazole sodium  40 mg Oral Daily  . simvastatin  20 mg Oral QPM  . warfarin  2 mg Oral ONCE-1800  . Warfarin - Pharmacist Dosing Inpatient   Does not apply q1800   Continuous Infusions:    LOS: 1 day    Time spent in minutes: 35    Calvert Cantor, MD [facility] Pager: [email or web address] Password [email or web address] 02/07/2017, 12:45 PM

## 2017-02-07 NOTE — Care Management Obs Status (Signed)
MEDICARE OBSERVATION STATUS NOTIFICATION   Patient Details  Name: [name] MRN: [id number] Date of Birth: 1941/02/20   Medicare Observation Status Notification Given:  Yes    Gala Lewandowsky, RN 02/07/2017, 3:09 PM

## 2017-02-07 NOTE — Consult Note (Signed)
   [facility] Inpatient Consult   02/07/2017  [name] [birth year] [id number]  Patient is currently active with [facility] for chronic disease management services in the [facility].  Patient has been engaged by a [place], [facility] Social Architectural technologist. Patient has an active consent form on file. Our community based plan of care has focused on disease management and community resource support.  Met with the patient and her sister at the bedside.  Permission was given to speak in front of her sister, [name].  Patient endorsees Dr. Maury Dus as her primary care provider.   Made Inpatient Case Manager aware that [facility] following. Of note, [facility] Care Management services does not replace or interfere with any services that are needed or arranged by inpatient case management or social work.  For additional questions or referrals please contact:   Natividad Brood, RN BSN [facility] Liaison  [phone] business mobile phone Toll free office [phone]

## 2017-02-07 NOTE — Progress Notes (Addendum)
Patient's BP= 72/57 per PT on bedside monitor, recheck = 87/58.  Manual = 80/60.  Dr Butler Denmark notified.  New orders received.     BP=102/86 post bolus, will continue to monitor

## 2017-02-08 ENCOUNTER — Ambulatory Visit: Payer: Self-pay | Admitting: *Deleted

## 2017-02-08 DIAGNOSIS — M4850XD Collapsed vertebra, not elsewhere classified, site unspecified, subsequent encounter for fracture with routine healing: Secondary | ICD-10-CM | POA: Diagnosis not present

## 2017-02-08 DIAGNOSIS — Q893 Situs inversus: Secondary | ICD-10-CM

## 2017-02-08 DIAGNOSIS — N183 Chronic kidney disease, stage 3 (moderate): Secondary | ICD-10-CM | POA: Diagnosis not present

## 2017-02-08 DIAGNOSIS — I471 Supraventricular tachycardia, unspecified: Secondary | ICD-10-CM

## 2017-02-08 DIAGNOSIS — J479 Bronchiectasis, uncomplicated: Secondary | ICD-10-CM | POA: Diagnosis not present

## 2017-02-08 DIAGNOSIS — R52 Pain, unspecified: Secondary | ICD-10-CM | POA: Diagnosis present

## 2017-02-08 DIAGNOSIS — S32010A Wedge compression fracture of first lumbar vertebra, initial encounter for closed fracture: Secondary | ICD-10-CM | POA: Diagnosis not present

## 2017-02-08 DIAGNOSIS — L89152 Pressure ulcer of sacral region, stage 2: Secondary | ICD-10-CM

## 2017-02-08 DIAGNOSIS — J9601 Acute respiratory failure with hypoxia: Secondary | ICD-10-CM | POA: Diagnosis not present

## 2017-02-08 DIAGNOSIS — J9611 Chronic respiratory failure with hypoxia: Secondary | ICD-10-CM

## 2017-02-08 DIAGNOSIS — J984 Other disorders of lung: Secondary | ICD-10-CM | POA: Diagnosis not present

## 2017-02-08 DIAGNOSIS — I5022 Chronic systolic (congestive) heart failure: Secondary | ICD-10-CM | POA: Diagnosis not present

## 2017-02-08 DIAGNOSIS — M069 Rheumatoid arthritis, unspecified: Secondary | ICD-10-CM

## 2017-02-08 DIAGNOSIS — M545 Low back pain: Secondary | ICD-10-CM | POA: Diagnosis not present

## 2017-02-08 DIAGNOSIS — Q240 Dextrocardia: Secondary | ICD-10-CM | POA: Diagnosis not present

## 2017-02-08 LAB — GLUCOSE, CAPILLARY
GLUCOSE-CAPILLARY: 96 mg/dL (ref 65–99)
GLUCOSE-CAPILLARY: 146 mg/dL — AB (ref 65–99)
Glucose-Capillary: 117 mg/dL — ABNORMAL HIGH (ref 65–99)
Glucose-Capillary: 111 mg/dL — ABNORMAL HIGH (ref 65–99)

## 2017-02-08 LAB — PROTIME-INR
INR: 1.47
Prothrombin Time: 17.7 seconds — ABNORMAL HIGH (ref 11.4–15.2)

## 2017-02-08 MED ORDER — SODIUM CHLORIDE 3 % IN NEBU
4.0000 mL | INHALATION_SOLUTION | Freq: Two times a day (BID) | RESPIRATORY_TRACT | Status: DC
  Administered 2017-02-08 – 2017-02-09 (×2): 4 mL via RESPIRATORY_TRACT
  Filled 2017-02-08 (×2): qty 4

## 2017-02-08 MED ORDER — FUROSEMIDE 40 MG PO TABS: 40.0000 mg | ORAL_TABLET | Freq: Three times a day (TID) | ORAL | 3 refills | Status: DC | PRN

## 2017-02-08 MED ORDER — COLLAGENASE 250 UNIT/GM EX OINT: TOPICAL_OINTMENT | Freq: Every day | CUTANEOUS | 0 refills | Status: AC

## 2017-02-08 MED ORDER — HYDROMORPHONE HCL 2 MG PO TABS: 1.0000 mg | ORAL_TABLET | ORAL | 0 refills | Status: AC | PRN

## 2017-02-08 MED ORDER — GUAIFENESIN 100 MG/5ML PO SOLN
10.0000 mL | Freq: Three times a day (TID) | ORAL | Status: DC
  Administered 2017-02-08 – 2017-02-09 (×3): 200 mg via ORAL
  Filled 2017-02-08: qty 10
  Filled 2017-02-08: qty 5
  Filled 2017-02-08: qty 10
  Filled 2017-02-08: qty 5

## 2017-02-08 MED ORDER — POLYETHYLENE GLYCOL 3350 17 G PO PACK: 17.0000 g | PACK | Freq: Every day | ORAL | 0 refills | Status: AC | PRN

## 2017-02-08 MED ORDER — WARFARIN SODIUM 4 MG PO TABS
4.0000 mg | ORAL_TABLET | Freq: Once | ORAL | Status: AC
  Administered 2017-02-08: 4 mg via ORAL
  Filled 2017-02-08: qty 1

## 2017-02-08 MED ORDER — FUROSEMIDE 40 MG PO TABS: 40.0000 mg | ORAL_TABLET | Freq: Every day | ORAL | 3 refills | Status: AC | PRN

## 2017-02-08 MED ORDER — GUAIFENESIN ER 600 MG PO TB12: 1200.0000 mg | ORAL_TABLET | Freq: Two times a day (BID) | ORAL | Status: DC

## 2017-02-08 NOTE — Discharge Summary (Signed)
Physician Discharge Summary  [name] DDU:[id number] DOB: 1940-10-23 DOA: 02/05/2017  PCP: Elias Else, MD  Admit date: 02/05/2017 Discharge date: 02/08/2017  Admitted From: home  Disposition:  home   Recommendations for Outpatient Follow-up:  1. F/u on back pain/ fracture 2. Lasix changed to PRN as she was dehydrated on admission 3. INR in 2-3 days- consider NOAC please  Home Health:  ordered     Discharge Condition:  stable   CODE STATUS:  Full code   Consultations:  cardiology    Discharge Diagnoses:  Principal Problem:   Vertebral compression fracture (HCC) Active Problems:   SVT (supraventricular tachycardia) (HCC)   Chronic systolic heart failure (HCC)   Dextrocardia   CAD   DM2 (diabetes mellitus, type 2) (HCC)   Saddle embolus of pulmonary artery (HCC)   Primary ciliary dyskinesia   Rheumatoid arthritis (HCC)   Decubital ulcer   CKD (chronic kidney disease) stage 3, GFR 30-59 ml/min   Chronic bronchiectasis not affecting current episode of care (HCC)   Kartagener syndrome    Subjective: Back pain is well controlled today. No constipation from narcotics. No other complaints.   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  She presents for back pain for 3 days with no acute trauma. Had kyphoplasty of T12 on 8/1 and back pain improved until now. Xray shows a new L1 compression fx.  WBC upt from 7.9 on 8/2 to 18.4. She has draining sacral decubitus ulcers which developed while in rehab after surgery and was stared on Doxycycline which she has taken for 4/7 days pior to admission. She has a chronic cough which is now worse but non productive.  CT chest shows:  Bilateral lower lobe bronchiectasis with chronic  areas of consolidation and bronchial thickening. Scattered ground-glass foci and tree-in-bud nodularity within the upper and lower lobes,compatible with respiratory infection, consider atypical organisms.  Found to have an irregular HR with SVT in the hospital which is new.  She is admitted for pain control, management of leukocytosis and arrhythmias.   Hospital Course:  Principal Problem:   Acute back pain - L1 superior endplate fx with 30% loss of height - s/p T 12 augmentation by Dr Rosita Kea in [place] - point tenderness in L1 noted- will try pain control -  Hydrocodone and Oxycodone cause nausea in her and therefore we tried Dilaudid which she had done fine with in the ER - Q 4- hr Dilaudid (except once asleep) has improved her pain - cont fosamax and Vit D- will add Calcium on discharge - PT eval >> HHPT recommended   Active Problems: SVT - has had 2 episodes so far despite taking home dose of Metoprolol 25 mg BID - she was dehydrated per U sp gravity and so I have given her a 500 cc NS bolus - cardiology called- she see's Dr Jaye Beagle - Metoprolol increased to 50 TID per cardiology however, she became quite hypotensive with BP 80/50s- cut Metoprolol dose back to 25 BID- no SVT since and no longer hypotensive - TFTs show a mild elevation in TSH and FT4- will not treat- repeat in about 2 months    Chronic systolic heart failure  - EF 40-45% in the past but improved to normal on last ECHO - cont Metoprolol- not  on ACE I - as EF is now normal, can hold off ACE I - can change Lasix to PRN as she was dehydrated on admission and EF is normal  Small decubitus ulcers - stage 3- no drainage noted- appreciate WOC eval>> Santyl and air mattress - Doxy to stop on 9/7 (today) for 7 day course     CAD s/p CABG - Aspirin    DM2 (diabetes mellitus, type 2)  - holding Metformin due to CT with contrast - can resume    Saddle embolus of pulmonary artery  - Coumadin per pharmacy-  INR subtherapeutic  Kartanger's syndrome Dextrocardia, Sinusitis Primary ciliary dyskinesia with chronic bronchiectasis Leukocytosis - no changes in chronic cough-  - CT changes noted but as she has no new symptoms, d/c'd Meropenem that was started on admission  CKD 3 - stable    Rheumatoid arthritis  - Methotrexate  Gout - Allopurinol  Discharge Instructions  Discharge Instructions    Diet - low sodium heart healthy    Complete by:  As directed    Increase activity slowly    Complete by:  As directed      Allergies as of 02/08/2017      Reactions   Penicillins Rash   Has patient had a PCN reaction causing immediate rash, facial/tongue/throat swelling, SOB or lightheadedness with hypotension: Yes Has patient had a PCN reaction causing severe rash involving mucus membranes or skin necrosis: No Has patient had a PCN reaction that required hospitalization: No Has patient had a PCN reaction occurring within the last 10 years: No If all of the above answers are "NO", then may proceed with Cephalosporin use.   Shellfish Allergy Anaphylaxis   Rash    Hydrocodone Other (See Comments)   Pt says "it's too high powered so it sends me out to space"   Oxycodone Other (See Comments)   Pt says "it's too high powered so it sends me out to space"   Levaquin [levofloxacin] Hives      Medication List    TAKE these medications   acetaminophen 160 MG/5ML elixir Commonly known as:  TYLENOL Take 320 mg by mouth every 6 (six) hours.   alendronate 70 MG tablet Commonly known as:  FOSAMAX Take 70 mg by mouth every Sunday.   allopurinol 100 MG tablet Commonly known as:  ZYLOPRIM Take 200 mg by mouth daily.   aspirin EC 81 MG tablet Take 81 mg by mouth daily.   cholecalciferol 1000 units tablet Commonly known as:  VITAMIN D Take 1,000 Units by mouth daily.   collagenase ointment Commonly known as:  SANTYL Apply topically daily.   doxycycline 100 MG capsule Commonly known  as:  VIBRAMYCIN Take 100 mg by mouth 2 (two) times daily. Has 4 days left   FLUTTER Devi Use 10-15 times daily   folic acid 800 MCG tablet Commonly known as:  FOLVITE Take 800 mcg by mouth daily.   furosemide 40 MG tablet Commonly known as:  LASIX Take 1 tablet (40 mg total) by mouth daily as needed for fluid or edema. Take only if you gain 2-3 lb of fluid in a 24 hr period What changed:  when to take this  reasons to take this  additional instructions   gabapentin 300 MG capsule Commonly known as:  NEURONTIN Take 300 mg by mouth 2 (two) times daily.   HYDROmorphone 2 MG tablet Commonly known as:  DILAUDID Take 0.5 tablets (1 mg total) by mouth every 4 (four)  hours as needed for severe pain.   ipratropium-albuterol 0.5-2.5 (3) MG/3ML Soln Commonly known as:  DUONEB Take 3 mLs by nebulization every 6 (six) hours as needed. What changed:  reasons to take this   metFORMIN 500 MG 24 hr tablet Commonly known as:  GLUCOPHAGE-XR Take 500 mg by mouth daily as needed (low blood sugar).   methotrexate 2.5 MG tablet Commonly known as:  RHEUMATREX Take 20 mg by mouth every Monday.   metoprolol tartrate 25 MG tablet Commonly known as:  LOPRESSOR Take 25 mg by mouth 2 (two) times daily.   omeprazole 20 MG capsule Commonly known as:  PRILOSEC Take 20 mg by mouth every morning.   polyethylene glycol packet Commonly known as:  MIRALAX / GLYCOLAX Take 17 g by mouth daily as needed for mild constipation.   simvastatin 20 MG tablet Commonly known as:  ZOCOR Take 20 mg by mouth every evening.   warfarin 2 MG tablet Commonly known as:  COUMADIN Take as directed by [facility] What changed:  how much to take  how to take this  when to take this  additional instructions            Discharge Care Instructions        Start     Ordered   02/08/17 0000  collagenase (SANTYL) ointment  Daily     02/08/17 0844   02/08/17 0000  polyethylene glycol (MIRALAX /  GLYCOLAX) packet  Daily PRN     02/08/17 0844   02/08/17 0000  HYDROmorphone (DILAUDID) 2 MG tablet  Every 4 hours PRN     02/08/17 0844   02/08/17 0000  Increase activity slowly     02/08/17 0844   02/08/17 0000  Diet - low sodium heart healthy     02/08/17 0844   02/08/17 0000  furosemide (LASIX) 40 MG tablet  Daily PRN     02/08/17 0856     Follow-up Information    Kennedy Bucker, MD Follow up on 02/11/2017.   Specialty:  Orthopedic Surgery Why:  at 0930 am  Contact information: [address] [facility] [place] [address] [phone]        Jobe Gibbon, [facility] Follow up.   Specialty:  Home Health Services Why:  Registered Nurse and Physical Therapy Contact information: [address] [place] [address] [phone]        Elias Else, MD Follow up.   Specialty:  Family Medicine Why:  Monday for INR and back pain and wound check Contact information: [address] [place] [address] [phone]          Allergies  Allergen Reactions  . Penicillins Rash    Has patient had a PCN reaction causing immediate rash, facial/tongue/throat swelling, SOB or lightheadedness with hypotension: Yes Has patient had a PCN reaction causing severe rash involving mucus membranes or skin necrosis: No Has patient had a PCN reaction that required hospitalization: No Has patient had a PCN reaction occurring within the last 10 years: No If all of the above answers are "NO", then may proceed with Cephalosporin use.   . Shellfish Allergy Anaphylaxis    Rash   . Hydrocodone Other (See Comments)    Pt says "it's too high powered so it sends me out to space"  . Oxycodone Other (See Comments)    Pt says "it's too high powered so it sends me out to space"  .  Levaquin [Levofloxacin] Hives     Procedures/Studies:   Dg Chest 2 View  Result Date: 02/05/2017 CLINICAL DATA:  Back pain EXAM: CHEST  2 VIEW  COMPARISON:  10/26/2016 FINDINGS: Bilateral lower lobe scarring/ atelectasis. No pleural effusion or pneumothorax. The heart is normal in size. Situs inversus. Postsurgical changes related to prior CABG. Median sternotomy. Thoracolumbar spine is described on dedicated radiographs. IVC filter. IMPRESSION: Thoracolumbar spine is described on dedicated radiographs. No evidence of acute cardiopulmonary disease. Electronically Signed   By: Charline Bills M.D.   On: 02/05/2017 20:31   Dg Thoracic Spine 2 View  Result Date: 02/05/2017 CLINICAL DATA:  Back pain, recent vertebroplasty EXAM: THORACIC SPINE 2 VIEWS COMPARISON:  MRI thoracic spine dated 12/31/2016 FINDINGS: Prior vertebral augmentation at T12. Mild degenerative changes of the thoracic spine. New superior endplate compression fracture deformity at L1 with approximately 30% loss of height. No retropulsion. IMPRESSION: New superior endplate compression fracture deformity at L1 with approximately 30% loss of height. No retropulsion. Prior vertebral augmentation at T12. Electronically Signed   By: Charline Bills M.D.   On: 02/05/2017 20:30   Dg Lumbar Spine 2-3 Views  Result Date: 02/05/2017 CLINICAL DATA:  Severe back pain, recent vertebroplasty EXAM: LUMBAR SPINE - 2-3 VIEW COMPARISON:  None. FINDINGS: Prior vertebral augmentation at T12. New superior endplate compression fracture deformity at L1 with approximately 30% loss of height. No retropulsion. Mild degenerative changes of the upper lumbar spine. IVC filter. IMPRESSION: New superior endplate compression fracture deformity at L1 with approximately 30% loss of height. No retropulsion. Electronically Signed   By: Charline Bills M.D.   On: 02/05/2017 20:32   Ct Angio Chest Pe W And/or Wo Contrast  Result Date: 02/05/2017 CLINICAL DATA:  Back pain with recent surgery EXAM: CT ANGIOGRAPHY CHEST WITH CONTRAST TECHNIQUE: Multidetector CT imaging of the chest was performed using the standard  protocol during bolus administration of intravenous contrast. Multiplanar CT image reconstructions and MIPs were obtained to evaluate the vascular anatomy. CONTRAST:  100 mL Isovue 370 intravenous COMPARISON:  Radiograph 02/05/2017, CT chest 05/26/2013, CT 12/30/2016 FINDINGS: Cardiovascular: Satisfactory opacification of the pulmonary arteries to the segmental level. No evidence of pulmonary embolism. Atherosclerotic calcifications of the aorta. No aneurysmal dilatation. Right-sided aortic arch with mirror image branching of the great vessels. No dissection is seen. Dextrocardia. Coronary artery calcification. No large pericardial effusion. Mediastinum/Nodes: Midline trachea. No thyroid. No significantly enlarged lymph nodes. Distal hiatal hernia. Lungs/Pleura: Bronchiectasis in the bilateral lower lobes with bronchial thickening and consolidation. Scattered ground-glass foci in the left upper lobe with mild tree-in-bud nodularity within the bilateral upper lobes and left greater than right lower lobes. No pleural effusion. No pneumothorax. Upper Abdomen:  Situs in versus.  Pneumobilia re- demonstrated. Musculoskeletal: Sternotomy changes. No acute or suspicious bone lesion Review of the MIP images confirms the above findings. IMPRESSION: 1. Negative for acute pulmonary embolus or aortic dissection 2. 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 Dextrocardia. 3. Bilateral lower lobe bronchiectasis with chronic areas of consolidation and bronchial thickening. Scattered ground-glass foci and tree-in-bud nodularity within the upper and lower lobes, compatible with respiratory infection, consider atypical organisms. 4. Situs  inversus.  Pneumobilia, postsurgical Aortic Atherosclerosis (ICD10-I70.0). Electronically Signed   By: Jasmine Pang M.D.   On: 02/05/2017 23:02       Discharge Exam: Vitals:   02/07/17 2026 02/08/17 0651  BP: 115/69 116/78  Pulse: 97  90  Resp: 20 (!) 22  Temp: 97.8 F (36.6 C) 97.8 F (36.6 C)  SpO2: 99% 100%   Vitals:   02/07/17 1600 02/07/17 1825 02/07/17 2026 02/08/17 0651  BP: 102/86 120/73 115/69 116/78  Pulse: 97 (!) 112 97 90  Resp:  20 20 (!) 22  Temp:   97.8 F (36.6 C) 97.8 F (36.6 C)  TempSrc:   Oral Oral  SpO2: 98% 98% 99% 100%  Weight:    68.9 kg (152 lb)  Height:        General: Pt is alert, awake, not in acute distress Cardiovascular: RRR, S1/S2 +, no rubs, no gallops Respiratory: CTA bilaterally, no wheezing, no rhonchi Abdominal: Soft, NT, ND, bowel sounds + Extremities: no edema, no cyanosis    The results of significant diagnostics from this hospitalization (including imaging, microbiology, ancillary and laboratory) are listed below for reference.     Microbiology: Recent Results (from the past 240 hour(s))  Culture, blood (routine x 2)     Status: None (Preliminary result)   Collection Time: 02/06/17  2:36 AM  Result Value Ref Range Status   Specimen Description BLOOD RIGHT HAND  Final   Special Requests IN PEDIATRIC BOTTLE Blood Culture adequate volume  Final   Culture NO GROWTH 1 DAY  Final   Report Status PENDING  Incomplete  Culture, blood (routine x 2)     Status: None (Preliminary result)   Collection Time: 02/06/17  3:40 AM  Result Value Ref Range Status   Specimen Description BLOOD LEFT ANTECUBITAL  Final   Special Requests IN PEDIATRIC BOTTLE Blood Culture adequate volume  Final   Culture NO GROWTH 1 DAY  Final   Report Status PENDING  Incomplete     Labs: BNP (last 3 results)  Recent Labs  02/05/17 1707  BNP 39.5   Basic Metabolic Panel:  Recent Labs Lab 02/05/17 1707 02/05/17 1715 02/07/17 1227  NA  --  138 137  K  --  3.6 4.0  CL  --  97* 97*  CO2  --  28 32  GLUCOSE  --  145* 107*  BUN  --  25* 16  CREATININE  --  1.23* 0.96  CALCIUM  --  9.3 8.7*  MG 1.6*  --  2.1   Liver Function Tests: No results for input(s): AST, ALT, ALKPHOS,  BILITOT, PROT, ALBUMIN in the last 168 hours. No results for input(s): LIPASE, AMYLASE in the last 168 hours. No results for input(s): AMMONIA in the last 168 hours. CBC:  Recent Labs Lab 02/05/17 1715 02/07/17 1109  WBC 18.4* 12.7*  HGB 12.1 11.0*  HCT 37.9 35.0*  MCV 101.6* 102.6*  PLT 402* 370   Cardiac Enzymes:  Recent Labs Lab 02/05/17 1707  TROPONINI <0.03   BNP: Invalid input(s): POCBNP CBG:  Recent Labs Lab 02/07/17 0753 02/07/17 1142 02/07/17 1632 02/07/17 2119 02/08/17 0801  GLUCAP 111* 104* 118* 133* 96   D-Dimer No results for input(s): DDIMER in the last 72 hours. Hgb A1c No results for input(s): HGBA1C in the last 72 hours. Lipid Profile No results for input(s): CHOL, HDL, LDLCALC, TRIG, CHOLHDL, LDLDIRECT in the last 72 hours. Thyroid function studies  Recent Labs  02/07/17 0346  TSH 5.256*   Anemia work up No results for input(s): VITAMINB12, FOLATE, FERRITIN, TIBC, IRON, RETICCTPCT in the last 72 hours. Urinalysis    Component Value Date/Time   COLORURINE YELLOW 02/06/2017 0000   APPEARANCEUR CLEAR 02/06/2017 0000   APPEARANCEUR Hazy 05/25/2013 1613   LABSPEC  1.031 (H) 02/06/2017 0000   LABSPEC 1.009 05/25/2013 1613   PHURINE 6.0 02/06/2017 0000   GLUCOSEU NEGATIVE 02/06/2017 0000   GLUCOSEU Negative 05/25/2013 1613   HGBUR NEGATIVE 02/06/2017 0000   BILIRUBINUR NEGATIVE 02/06/2017 0000   BILIRUBINUR Negative 05/25/2013 1613   KETONESUR 5 (A) 02/06/2017 0000   PROTEINUR NEGATIVE 02/06/2017 0000   UROBILINOGEN 1.0 08/12/2011 2028   NITRITE NEGATIVE 02/06/2017 0000   LEUKOCYTESUR NEGATIVE 02/06/2017 0000   LEUKOCYTESUR 1+ 05/25/2013 1613   Sepsis Labs Invalid input(s): PROCALCITONIN,  WBC,  LACTICIDVEN Microbiology Recent Results (from the past 240 hour(s))  Culture, blood (routine x 2)     Status: None (Preliminary result)   Collection Time: 02/06/17  2:36 AM  Result Value Ref Range Status   Specimen Description BLOOD RIGHT  HAND  Final   Special Requests IN PEDIATRIC BOTTLE Blood Culture adequate volume  Final   Culture NO GROWTH 1 DAY  Final   Report Status PENDING  Incomplete  Culture, blood (routine x 2)     Status: None (Preliminary result)   Collection Time: 02/06/17  3:40 AM  Result Value Ref Range Status   Specimen Description BLOOD LEFT ANTECUBITAL  Final   Special Requests IN PEDIATRIC BOTTLE Blood Culture adequate volume  Final   Culture NO GROWTH 1 DAY  Final   Report Status PENDING  Incomplete     Time coordinating discharge: Over 30 minutes  SIGNED:   Calvert Cantor, MD  [facility] 02/08/2017, 8:56 AM Pager   If 7PM-7AM, please contact night-coverage [email or web address] Password [email or web address]

## 2017-02-08 NOTE — Progress Notes (Signed)
Initial Nutrition Assessment  DOCUMENTATION CODES:   Severe malnutrition in context of acute illness/injury  INTERVENTION:  - Continue Ensure Enlive po BID, each supplement provides 350 kcal and 20 grams of protein - Promoted PO intake   NUTRITION DIAGNOSIS:   Malnutrition (severe) related to acute illness (back pain, s/p back surgery) as evidenced by moderate depletions of muscle mass, mild fluid accumulation, percent weight loss (7% in 1 month).  GOAL:   Patient will meet greater than or equal to 90% of their needs  MONITOR:   PO intake, Supplement acceptance, Weight trends, I & O's  REASON FOR ASSESSMENT:   Malnutrition Screening Tool    ASSESSMENT:   Pt with PMH with HTN, HLD, GERD, arthritis, neuropathy, CAD, CHF, DM, CKD, on home O2 (2L), and chronic back pain presents with acute back pain for 3 days with vertebral compression fracture   Pt reports occasional nausea with no vomiting. Pt reports occasional slight difficulty regarding chewing r/t dentures.    Pt reports weight loss. Pt reports a UBW of 163 lbs, and stated she weighed this in August. This is weight trend represented in chart review as well. This is a weight loss of 11 lbs, 7% in 1 month, significant for time frame.  Pt reports consuming 2-3 small meals/d and 2-3 Ensures/d PTA. Pt reports a decreased appetite and intake since her recent  surgery. Per chart review pt had kyphoplasty of T12 on 01/02/17. Per meal completion record, pt has consumed 0-75% of meals this admission.  Pt reports enjoying Ensure and is amenable to continue supplementation during admission. Pt reports consuming two already today.   Labs reviewed; CBG 96-146 Medications reviewed; sliding scale insulin, Protonix, warfarin, PRN Zofran, PRN Miralax  Nutrition focused physical exam completed. Findings include no fat depletion, mild to moderate muscle depletion and mild edema.  Diet Order:  Diet heart healthy/carb modified Room service  appropriate? Yes; Fluid consistency: Thin Diet - low sodium heart healthy  Skin:  Wound (see comment) (Stage I at bilateral buttocks, Stage III at L and R buttocks, Incision at back)  Last BM:  02/06/17  Height:   Ht Readings from Last 1 Encounters:  02/06/17 5\' 2"  (1.575 m)    Weight:   Wt Readings from Last 1 Encounters:  02/08/17 152 lb (68.9 kg)    Ideal Body Weight:  50 kg  BMI:  Body mass index is 27.8 kg/m.  Estimated Nutritional Needs:   Kcal:  1700-1900  Protein:  105-115 grams  Fluid:  >/= 1.7 L/d  EDUCATION NEEDS:   Education needs no appropriate at this time  04/10/17, MS, RDN, LDN 02/08/2017 4:36 PM

## 2017-02-08 NOTE — Progress Notes (Signed)
Patient ambulated with RN and tech, using a walker, about 50 ft in the hall. HR 110-120s throughout walk. O2 sats dropped to 70s on 2L and was increased to 4L and still dropped to 70s while walking. Patient took 2-3 rest breaks to bring O2 sats back to 90s as well as when patient stated that she felt dizzy. Checked BP when patient took rest breaks and the SBP maintained in the 90s/ low 100s. Will continue to monitor.

## 2017-02-08 NOTE — Progress Notes (Signed)
ANTICOAGULATION CONSULT NOTE  Pharmacy Consult for Coumadin Indication: h/o VTE  Allergies  Allergen Reactions  . Penicillins Rash    Has patient had a PCN reaction causing immediate rash, facial/tongue/throat swelling, SOB or lightheadedness with hypotension: Yes Has patient had a PCN reaction causing severe rash involving mucus membranes or skin necrosis: No Has patient had a PCN reaction that required hospitalization: No Has patient had a PCN reaction occurring within the last 10 years: No If all of the above answers are "NO", then may proceed with Cephalosporin use.   . Shellfish Allergy Anaphylaxis    Rash   . Hydrocodone Other (See Comments)    Pt says "it's too high powered so it sends me out to space"  . Oxycodone Other (See Comments)    Pt says "it's too high powered so it sends me out to space"  . Levaquin [Levofloxacin] Hives    Patient Measurements: Height: 5\' 2"  (157.5 cm) Weight: 152 lb (68.9 kg) IBW/kg (Calculated) : 50.1  Vital Signs: Temp: 97.4 F (36.3 C) (09/07 1415) Temp Source: Oral (09/07 1415) BP: 104/72 (09/07 1415) Pulse Rate: 102 (09/07 1415)  Labs:  Recent Labs  02/05/17 1707 02/05/17 1715 02/06/17 1140 02/07/17 0346 02/07/17 1109 02/07/17 1227 02/08/17 0310  HGB  --  12.1  --   --  11.0*  --   --   HCT  --  37.9  --   --  35.0*  --   --   PLT  --  402*  --   --  370  --   --   LABPROT 16.5*  --  16.3* 17.9*  --   --  17.7*  INR 1.34  --  1.32 1.49  --   --  1.47  CREATININE  --  1.23*  --   --   --  0.96  --   TROPONINI <0.03  --   --   --   --   --   --     Estimated Creatinine Clearance: 46.8 mL/min (by C-G formula based on SCr of 0.96 mg/dL).   Medical History: Past Medical History:  Diagnosis Date  . Allergy   . Anemia   . Arthritis   . Blood transfusion without reported diagnosis   . CAD (coronary artery disease)    NSTEMI in setting of gallstone pancreatitis 05/2011:  LHC with 3v CAD; CABG was performed 06/14/11:  RIMA-LAD, SVG-ramus, SVG-RCA  . Carotid stenosis    Dopplers 06/12/11: LICA 60-79%.  . Cataract   . CHF (congestive heart failure) (HCC)   . Chronic back pain   . Chronic bronchitis   . Chronic kidney disease   . Chronic systolic heart failure (HCC)   . Collagen vascular disease (HCC)   . Dextrocardia   . Diabetes mellitus without complication (HCC)   . DVT of leg (deep venous thrombosis) (HCC) ~2006   left  . Fracture 05/24/2011   right; "did not have surgery"  . GERD (gastroesophageal reflux disease)   . H/O hiatal hernia   . HLD (hyperlipidemia)   . Hypertension   . Ischemic cardiomyopathy    Echocardiogram 06/05/11: EF 40-45%, anteroseptal hypokinesis, apical hypokinesis, mild LAE  . Myocardial infarction (HCC)   . Neuromuscular disorder (HCC)   . Neuropathy   . Osteoporosis   . Oxygen deficiency    2l  . Pneumonia 06/13/11   "couple times; long time ago"  . Pulmonary embolus [facility]) March 2013  . Rheumatoid arthritis (HCC)   .  Situs inversus with dextrocardia    CT 06/2011: situs inversus totalis.  What would typically be called RCA arose from anterior sinus of Valsalva.  What would typically be called the left main arises from the posterior sinus of Valsalva and gives rise to a large first diagonal branch and diminutive circumflex    Medications:  No current facility-administered medications on file prior to encounter.    Current Outpatient Prescriptions on File Prior to Encounter  Medication Sig Dispense Refill  . allopurinol (ZYLOPRIM) 100 MG tablet Take 200 mg by mouth daily.     Marland Kitchen aspirin EC 81 MG tablet Take 81 mg by mouth daily.    . cholecalciferol (VITAMIN D) 1000 UNITS tablet Take 1,000 Units by mouth daily.    . folic acid (FOLVITE) 800 MCG tablet Take 800 mcg by mouth daily.     Marland Kitchen gabapentin (NEURONTIN) 300 MG capsule Take 300 mg by mouth 2 (two) times daily.      Marland Kitchen ipratropium-albuterol (DUONEB) 0.5-2.5 (3) MG/3ML SOLN Take 3 mLs by nebulization every 6 (six)  hours as needed. (Patient taking differently: Take 3 mLs by nebulization every 6 (six) hours as needed (wheezing/sob). ) 360 mL 5  . metFORMIN (GLUCOPHAGE-XR) 500 MG 24 hr tablet Take 500 mg by mouth daily as needed (low blood sugar).     . methotrexate (RHEUMATREX) 2.5 MG tablet Take 20 mg by mouth every Monday.     . metoprolol tartrate (LOPRESSOR) 25 MG tablet Take 25 mg by mouth 2 (two) times daily.    Marland Kitchen omeprazole (PRILOSEC) 20 MG capsule Take 20 mg by mouth every morning.      . simvastatin (ZOCOR) 20 MG tablet Take 20 mg by mouth every evening.     . warfarin (COUMADIN) 2 MG tablet Take as directed by [facility] (Patient taking differently: Take 2-4 mg by mouth See admin instructions. Take 2mg  on Sat/Sun and 4mg  on Mon-Fri.) 65 tablet 3  . [DISCONTINUED] furosemide (LASIX) 40 MG tablet Take 1 tablet (40 mg total) by mouth daily. 90 tablet 3  . alendronate (FOSAMAX) 70 MG tablet Take 70 mg by mouth every Sunday.     Respiratory Therapy Supplies (FLUTTER) DEVI Use 10-15 times daily 1 each 0    Assessment: 76 y.o. female admitted with back pain/compression fracture, h/o PE/DVT. INR was subtherapeutic at 1.34 on admission. PTA regimen (as of note from 9/4) is 2 mg daily except 4 mg on Monday and Friday.  Patient was no on a bridge PTA -INR today is 1.47  Goal of Therapy:  INR 2-3 Monitor platelets by anticoagulation protocol: Yes   Plan:  Coumadin 4 mg po x 1  Daily INR Will defer to team if want to bridge while INR is subtherapeutic since being used for history of and not active DVT  Monday, Pharm D 02/08/2017 3:19 PM

## 2017-02-08 NOTE — Progress Notes (Signed)
PROGRESS NOTE    [name]   OHY:[id number]  DOB: April 02, 1941  DOA: 02/05/2017 PCP: Elias Else, MD   Brief Narrative:  [name] 76 y.o. 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  She presents for back pain for 3 days with no acute trauma. Had kyphoplasty of T12 on 8/1 and back pain improved until now. Xray shows a new L1 compression fx.  WBC upt from 7.9 on 8/2 to 18.4. She has draining sacral decubitus ulcers which developed while in rehab after surgery and was stared on Doxycycline which she has taken for 4/7 days pior to admission. She has a chronic cough which is now worse but non productive.  CT chest shows:  Bilateral lower lobe bronchiectasis with chronic areas of consolidation and bronchial thickening. Scattered ground-glass foci and tree-in-bud nodularity within the upper and lower lobes,compatible with respiratory infection, consider atypical organisms.  Found to have an irregular HR with SVT in the hospital which is new.  She is admitted for pain control, management of leukocytosis and arrhythmias.  Subjective: Back pain better controlled on current regimen.  ROS: no complaints of nausea, vomiting, constipation diarrhea, cough, dyspnea or dysuria. No other complaints.   Assessment & Plan:   Principal Problem:   Acute back pain - L1 superior endplate fx with 30% loss of height - s/p T 12 augmentation by Dr Rosita Kea in [place] - point tenderness in L1 noted- will try pain control -  Hydrocodone and Oxycodone cause nausea in her and therefore we tried Dilaudid which she had done fine with in the ER - Q 4 Dilaudid for now except once asleep- d/w RN - cont fosamax and Vit D- will add Calcium on discharge   Active Problems: SVT - has had 2  episodes so far despite taking home dose of Metoprolol 25 mg BID - when not in SVT, EKG was showing variating P waves - she was dehydrated per U sp gravity and so I have given her a 500 cc NS bolus - cardiology called- she see's Dr Jaye Beagle - Metoprolol increased to 50 TID per cardiology but BP dropped to 80s- improved after changing back to home dose- no recurrence of SVT  - TFTs show a mild elevation in TSH and FT4- will not treat- repeat in about 2 months   Hypoxia - pulse ox in 80s on 4 L on exertion today- asked for pulm eval- recommended hypertonic nebs, mucninex and cont flutter valve - no antibiotics ordered    Chronic systolic heart failure  - EF 40-45% in the past but improved to normal on last ECHO - cont Metoprolol- not on ACE I - as EF is now normal, can hold off ACE I  Small decubitus ulcers - stage 3- no drainage noted- appreciate WOC eval>> Santyl and air mattress - Doxy to stop on 9/7 for 7 day course  Leukocytosis - ? Due to ulcers vs stress from pain? CT chest changes noted but she has no pulmonary symptoms - cont Doxy    CAD s/p CABG - Aspirin    DM2 (diabetes mellitus, type 2)  - hold Metformin due to CT with contrast - ISS TID    Saddle embolus of pulmonary artery  - Coumadin per pharmacy- INR subtherapeutic    Primary ciliary dyskinesia with chronic bronchiectasis -  no changes in chronic cough-  - CT changes noted but as she has no new symptoms, d/c'd Meropenem  CKD 3 - stable    Rheumatoid arthritis  - Methotrexate  Gout - Allopurinol  DVT prophylaxis: SCDs Code Status: full code Family Communication: sister Disposition Plan: to be determined Consultants:   cardiology Procedures:    Antimicrobials:  Anti-infectives    Start     Dose/Rate Route Frequency Ordered Stop   02/06/17 1800  doxycycline (VIBRA-TABS) tablet 100 mg     100 mg Oral Every 12 hours 02/06/17 1341     02/06/17 0600  meropenem (MERREM) 1 g in sodium chloride 0.9 %  100 mL IVPB  Status:  Discontinued    Comments:  Meropenem 1 g IV q12h for CrCl < 50 mL/min   1 g 200 mL/hr over 30 Minutes Intravenous Every 12 hours 02/06/17 0302 02/06/17 1318   02/06/17 0300  doxycycline (VIBRAMYCIN) 100 mg in dextrose 5 % 250 mL IVPB  Status:  Discontinued     100 mg 125 mL/hr over 120 Minutes Intravenous Every 12 hours 02/06/17 0216 02/06/17 1341   02/06/17 0215  doxycycline (VIBRAMYCIN) 100 mg in dextrose 5 % 250 mL IVPB  Status:  Discontinued     100 mg 125 mL/hr over 120 Minutes Intravenous Every 12 hours 02/06/17 0215 02/06/17 0216       Objective: Vitals:   02/08/17 1138 02/08/17 1141 02/08/17 1147 02/08/17 1415  BP: 110/73 105/62 97/68 104/72  Pulse: (!) 108 (!) 113 (!) 111 (!) 102  Resp: 16 (!) 21 16   Temp:    (!) 97.4 F (36.3 C)  TempSrc:    Oral  SpO2: 95% 94% 92% 97%  Weight:      Height:        Intake/Output Summary (Last 24 hours) at 02/08/17 1539 Last data filed at 02/08/17 1300  Gross per 24 hour  Intake              897 ml  Output              850 ml  Net               47 ml   Filed Weights   02/06/17 1300 02/07/17 0536 02/08/17 0651  Weight: 73.9 kg (163 lb) 69.9 kg (154 lb) 68.9 kg (152 lb)    Examination: General exam: Appears comfortable  HEENT: PERRLA, oral mucosa moist, no sclera icterus or thrush Respiratory system: Clear to auscultation. Respiratory effort normal. Cardiovascular system: S1 & S2 heard,  No murmurs  Gastrointestinal system: Abdomen soft, non-tender, nondistended. Normal bowel sound. No organomegaly Central nervous system: Alert and oriented. No focal neurological deficits. Extremities: No cyanosis, clubbing or edema Skin: No rashes or ulcers MSK: point tenderness in L1 Psychiatry:  Mood & affect appropriate.      Data Reviewed: I have personally reviewed following labs and imaging studies  CBC:  Recent Labs Lab 02/05/17 1715 02/07/17 1109  WBC 18.4* 12.7*  HGB 12.1 11.0*  HCT 37.9 35.0*    MCV 101.6* 102.6*  PLT 402* 370   Basic Metabolic Panel:  Recent Labs Lab 02/05/17 1707 02/05/17 1715 02/07/17 1227  NA  --  138 137  K  --  3.6 4.0  CL  --  97* 97*  CO2  --  28 32  GLUCOSE  --  145* 107*  BUN  --  25* 16  CREATININE  --  1.23* 0.96  CALCIUM  --  9.3 8.7*  MG 1.6*  --  2.1   GFR: Estimated Creatinine Clearance: 46.8 mL/min (by C-G formula based on SCr of 0.96 mg/dL). Liver Function Tests: No results for input(s): AST, ALT, ALKPHOS, BILITOT, PROT, ALBUMIN in the last 168 hours. No results for input(s): LIPASE, AMYLASE in the last 168 hours. No results for input(s): AMMONIA in the last 168 hours. Coagulation Profile:  Recent Labs Lab 02/05/17 02/05/17 1707 02/06/17 1140 02/07/17 0346 02/08/17 0310  INR 1.3 1.34 1.32 1.49 1.47   Cardiac Enzymes:  Recent Labs Lab 02/05/17 1707  TROPONINI <0.03   BNP (last 3 results) No results for input(s): PROBNP in the last 8760 hours. HbA1C: No results for input(s): HGBA1C in the last 72 hours. CBG:  Recent Labs Lab 02/07/17 1142 02/07/17 1632 02/07/17 2119 02/08/17 0801 02/08/17 1200  GLUCAP 104* 118* 133* 96 146*   Lipid Profile: No results for input(s): CHOL, HDL, LDLCALC, TRIG, CHOLHDL, LDLDIRECT in the last 72 hours. Thyroid Function Tests:  Recent Labs  02/07/17 0346  TSH 5.256*  FREET4 1.60*   Anemia Panel: No results for input(s): VITAMINB12, FOLATE, FERRITIN, TIBC, IRON, RETICCTPCT in the last 72 hours. Urine analysis:    Component Value Date/Time   COLORURINE YELLOW 02/06/2017 0000   APPEARANCEUR CLEAR 02/06/2017 0000   APPEARANCEUR Hazy 05/25/2013 1613   LABSPEC 1.031 (H) 02/06/2017 0000   LABSPEC 1.009 05/25/2013 1613   PHURINE 6.0 02/06/2017 0000   GLUCOSEU NEGATIVE 02/06/2017 0000   GLUCOSEU Negative 05/25/2013 1613   HGBUR NEGATIVE 02/06/2017 0000   BILIRUBINUR NEGATIVE 02/06/2017 0000   BILIRUBINUR Negative 05/25/2013 1613   KETONESUR 5 (A) 02/06/2017 0000    PROTEINUR NEGATIVE 02/06/2017 0000   UROBILINOGEN 1.0 08/12/2011 2028   NITRITE NEGATIVE 02/06/2017 0000   LEUKOCYTESUR NEGATIVE 02/06/2017 0000   LEUKOCYTESUR 1+ 05/25/2013 1613   Sepsis Labs: @LABRCNTIP (procalcitonin:4,lacticidven:4) ) Recent Results (from the past 240 hour(s))  Culture, blood (routine x 2)     Status: None (Preliminary result)   Collection Time: 02/06/17  2:36 AM  Result Value Ref Range Status   Specimen Description BLOOD RIGHT HAND  Final   Special Requests IN PEDIATRIC BOTTLE Blood Culture adequate volume  Final   Culture NO GROWTH 1 DAY  Final   Report Status PENDING  Incomplete  Culture, blood (routine x 2)     Status: None (Preliminary result)   Collection Time: 02/06/17  3:40 AM  Result Value Ref Range Status   Specimen Description BLOOD LEFT ANTECUBITAL  Final   Special Requests IN PEDIATRIC BOTTLE Blood Culture adequate volume  Final   Culture NO GROWTH 1 DAY  Final   Report Status PENDING  Incomplete         Radiology Studies: No results found.    Scheduled Meds: . allopurinol  200 mg Oral Daily  . aspirin  81 mg Oral Daily  . collagenase   Topical Daily  . doxycycline  100 mg Oral Q12H  . feeding supplement (ENSURE ENLIVE)  237 mL Oral BID BM  . gabapentin  400 mg Oral BID  . guaiFENesin  10 mL Oral TID  . HYDROmorphone  1 mg Oral Q4H  . insulin aspart  0-9 Units Subcutaneous TID WC  . [START ON 02/11/2017] methotrexate  20 mg Oral Q Mon  . metoprolol tartrate  5 mg Intravenous Once  . metoprolol tartrate  25 mg Oral BID  . pantoprazole sodium  40 mg Oral Daily  . simvastatin  20 mg Oral QPM  . sodium chloride HYPERTONIC  4 mL Nebulization BID  . warfarin  4 mg Oral ONCE-1800  . Warfarin - Pharmacist Dosing Inpatient   Does not apply q1800   Continuous Infusions:    LOS: 1 day    Time spent in minutes: 35    Calvert Cantor, MD [facility] Pager: [email or web address] Password [facility] 02/08/2017, 3:39 PM

## 2017-02-08 NOTE — Consult Note (Signed)
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 C/b severe airflow limitations w/ PFT (FEV 1  34% (0.72 L) predicted back in May 2018 . Underwent recent T12 kyphoplasty Aug 01. Also on chronic Warfarin for PE back in 2013. She was last seen in July 2018 and was being managed on: nocturnal oxygen and oxygen w/ exertion, Duoneb, flutter valve and chest percussion. Admitted 7/5 w/ new lumbar fracture.  During course not tolerating routine chest PT.  PCCM asked to see d/t worsening hypoxia   SIGNIFICANT EVENTS   STUDIES:  CT chest 9/7: 1. Negative for acute pulmonary embolus or aortic dissection 2. 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 Dextrocardia. 3. Bilateral lower lobe bronchiectasis with chronic areas of consolidation and bronchial thickening. Scattered ground-glass foci and tree-in-bud nodularity within the upper and lower lobes, compatible with respiratory infection, consider atypical organisms. 4. Situs  inversus.  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 She also has sig h/o situs inversus totalis, CAD, CABG 2013 and recent T12 kyphoplasty Aug 01. Also on chronic Warfarin for PE back in 2013. She was last seen in July 2018 and was being managed on: nocturnal oxygen and oxygen w/  exertion, Duoneb, flutter valve and chest percussion. At that point it was advised to increase chest percussion to twice daily and increase BD intervals to at least twice a day.  On 9/5 she was admitted to the hospital service w/ cc: back pain x 3d was admitted for pain control and leukocytosis w/ SVT. She was started on PRN narcotics, empiric abx. Imaging obtained showed she had new L1 superior end-plate fracture for which she was treated symptomatically. As part of the evaluation she had a CT chest obtained. This showed bilateral btx, as well as scattered nodular airspace disease. During hospital course she has refused chest PT w/ vest due to pain. Was up ambulating w/ nursing. O2 sats dropped to 70s on 2 liters and did not respond to increasing oxygen to 4 liters. PCCM asked to see for worsening hypoxia.  PAST MEDICAL HISTORY :   has a past medical history of Allergy; Anemia; Arthritis; Blood transfusion without reported diagnosis; CAD (coronary artery disease); Carotid stenosis; Cataract; CHF (congestive heart failure) (HCC); Chronic back pain; Chronic bronchitis; Chronic kidney disease; Chronic systolic heart failure (HCC); Collagen vascular disease (HCC); Dextrocardia; Diabetes mellitus without complication (HCC); DVT of leg (deep venous thrombosis) (HCC) (~2006); Fracture (05/24/2011); GERD (gastroesophageal reflux disease); H/O hiatal hernia; HLD (hyperlipidemia); Hypertension; Ischemic cardiomyopathy; Myocardial infarction (HCC); Neuromuscular disorder (HCC); Neuropathy; Osteoporosis; Oxygen deficiency; Pneumonia (06/13/11); Pulmonary embolus [facility]) (March 2013); Rheumatoid arthritis (HCC); and Situs inversus with dextrocardia.  has a past surgical history that includes ERCP (06/03/2011); Vaginal hysterectomy (1970's); Cardiac catheterization (06/11/11); Coronary artery bypass graft (06/14/2011); left heart catheterization with  coronary angiogram (N/A, 06/11/2011); arch aortogram (Right, 06/11/2011);  Cholecystectomy; and Kyphoplasty (N/A, 01/02/2017). Prior to Admission medications   Medication Sig Start Date End Date Taking? Authorizing Provider  acetaminophen (TYLENOL) 160 MG/5ML elixir Take 320 mg by mouth every 6 (six) hours.   Yes [provider]  allopurinol (ZYLOPRIM) 100 MG tablet Take 200 mg by mouth daily.    Yes [provider]  aspirin EC 81 MG tablet Take 81 mg by mouth daily.   Yes [provider]  cholecalciferol (VITAMIN D) 1000 UNITS tablet Take 1,000 Units by mouth daily.   Yes [provider]  doxycycline (VIBRAMYCIN) 100 MG capsule Take 100 mg by mouth 2 (two) times daily. Has 4 days left 02/01/17  Yes [provider]  folic acid (FOLVITE) 800 MCG tablet Take 800 mcg by mouth daily.    Yes [provider]  gabapentin (NEURONTIN) 300 MG capsule Take 300 mg by mouth 2 (two) times daily.     Yes [provider]  ipratropium-albuterol (DUONEB) 0.5-2.5 (3) MG/3ML SOLN Take 3 mLs by nebulization every 6 (six) hours as needed. Patient taking differently: Take 3 mLs by nebulization every 6 (six) hours as needed (wheezing/sob).  11/14/16  Yes Merwyn Katos, MD  metFORMIN (GLUCOPHAGE-XR) 500 MG 24 hr tablet Take 500 mg by mouth daily as needed (low blood sugar).  10/31/16  Yes [provider]  methotrexate (RHEUMATREX) 2.5 MG tablet Take 20 mg by mouth every Monday.    Yes [provider]  metoprolol tartrate (LOPRESSOR) 25 MG tablet Take 25 mg by mouth 2 (two) times daily.   Yes [provider]  omeprazole (PRILOSEC) 20 MG capsule Take 20 mg by mouth every morning.     Yes [provider]  simvastatin (ZOCOR) 20 MG tablet Take 20 mg by mouth every evening.    Yes [provider]  warfarin (COUMADIN) 2 MG tablet Take as directed by [facility] Patient taking differently: Take 2-4 mg by mouth See admin instructions. Take 2mg  on Sat/Sun and 4mg  on Mon-Fri. 10/17/16  Yes Kathleene Hazel, MD  alendronate (FOSAMAX) 70 MG tablet Take 70 mg by mouth every Sunday.     [provider]  collagenase (SANTYL) ointment Apply topically daily. 02/08/17   Calvert Cantor, MD  furosemide (LASIX) 40 MG tablet Take 1 tablet (40 mg total) by mouth daily as needed for fluid or edema. Take only if you gain 2-3 lb of fluid in a 24 hr period 02/08/17 05/09/17  Calvert Cantor, MD  HYDROmorphone (DILAUDID) 2 MG tablet Take 0.5 tablets (1 mg total) by mouth every 4 (four) hours as needed for severe pain. 02/08/17   Calvert Cantor, MD  polyethylene glycol (MIRALAX / GLYCOLAX) packet Take 17 g by mouth daily as needed for mild constipation. 02/08/17   Calvert Cantor, MD  Respiratory Therapy Supplies (FLUTTER) DEVI Use 10-15 times daily 09/10/16   Merwyn Katos, MD   Allergies  Allergen Reactions  . Penicillins Rash    Has patient had a PCN reaction causing immediate rash, facial/tongue/throat swelling, SOB or lightheadedness with hypotension: Yes Has patient had a PCN reaction causing severe rash involving mucus membranes or skin necrosis: No Has patient had a PCN reaction that required hospitalization: No Has patient had a PCN reaction occurring within the last 10 years: No If all of the above answers are "NO", then may proceed with Cephalosporin use.   . Shellfish Allergy Anaphylaxis    Rash   .  Hydrocodone Other (See Comments)    Pt says "it's too high powered so it sends me out to space"  . Oxycodone Other (See Comments)    Pt says "it's too high powered so it sends me out to space"  . Levaquin [Levofloxacin] Hives    FAMILY HISTORY:  family history includes Heart disease in her father and sister. SOCIAL HISTORY:  reports that she has never smoked. She has never used smokeless tobacco. She reports that she does not drink alcohol or use drugs.  REVIEW OF SYSTEMS:   Constitutional: Negative for fever, chills, weight loss, malaise/fatigue and diaphoresis.  HENT: Negative for  hearing loss, ear pain, nosebleeds, congestion, sore throat, neck pain, tinnitus and ear discharge.   Eyes: Negative for blurred vision, double vision, photophobia, pain, discharge and redness.  Respiratory: cough is non-productive, hemoptysis, no sputum production, shortness of breath, wheezing and stridor.   Cardiovascular: Negative for chest pain, palpitations, orthopnea, claudication, leg swelling and PND.  Gastrointestinal: Negative for heartburn, nausea, vomiting, abdominal pain, diarrhea, constipation, blood in stool and melena.  Genitourinary: Negative for dysuria, urgency, frequency, hematuria and flank pain.  Musculoskeletal: Negative for myalgias, back pain, joint pain and falls.  Skin: Negative for itching and rash.  Neurological: Negative for dizziness, tingling, tremors, sensory change, speech change, focal weakness, seizures, loss of consciousness, weakness and headaches.  Endo/Heme/Allergies: Negative for environmental allergies and polydipsia. Does not bruise/bleed easily.  SUBJECTIVE:  Still short of breath  VITAL SIGNS: Temp:  [97.6 F (36.4 C)-97.8 F (36.6 C)] 97.8 F (36.6 C) (09/07 0651) Pulse Rate:  [90-113] 111 (09/07 1147) Resp:  [15-22] 16 (09/07 1147) BP: (72-120)/(57-86) 97/68 (09/07 1147) SpO2:  [90 %-100 %] 92 % (09/07 1147) FiO2 (%):  [30 %] 30 % (09/06 1552) Weight:  [152 lb (68.9 kg)] 152 lb (68.9 kg) (09/07 0651) 4 liters  PHYSICAL EXAMINATION: General appearance:  76 Year old female, well nourished NAD,conversant  Eyes: anicteric sclerae , moist conjunctivae; PERRL, EOMI bilaterally. Mouth:  membranes and no mucosal ulcerations; normal hard and soft palate Neck: Trachea midline; neck supple, no JVD Lungs/chest: decreased t/o, with normal respiratory effort and no intercostal retractions CV: RRR, no MRGs  Abdomen: Soft, non-tender; no masses or HSM Extremities: No peripheral edema or extremity lymphadenopathy Skin: Normal temperature, turgor and  texture; no rash, ulcers or subcutaneous nodules Psych: Appropriate affect, alert and oriented to person, place and time    Recent Labs Lab 02/05/17 1715 02/07/17 1227  NA 138 137  K 3.6 4.0  CL 97* 97*  CO2 28 32  BUN 25* 16  CREATININE 1.23* 0.96  GLUCOSE 145* 107*    Recent Labs Lab 02/05/17 1715 02/07/17 1109  HGB 12.1 11.0*  HCT 37.9 35.0*  WBC 18.4* 12.7*  PLT 402* 370   No results found.  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  DM 2  Acute on chronic hypoxic respiratory failure due to what is likely mucous plugging in setting of known bronchiectasis w/ severe airflow limitations by PFTs from Kartagener syndrome; possibly c/b chronic dysphagia and sinus disease-->likely exacerbated by new lumbar fracture and inability to tolerate chest PT and much needed pulmonary hygiene measures.   Plan Add hypertonic saline nebs Push flutter Mobilize rx pain She does  not seem infected.  This is a difficult situation because the cornerstone of her therapy for her BTX should be focus on pulmonary hygiene. Unless the pain is adequately controlled it may be difficult to progress much    Simonne Martinet ACNP-BC [facility] Pulmonary/Critical Care Pager # [phone] OR # [phone] if no answer   02/08/2017, 1:51 PM

## 2017-02-08 NOTE — Progress Notes (Signed)
Physical Therapy Treatment Patient Details Name: [name] MRN: [id number] DOB: Nov 16, 1940 Today's Date: 02/08/2017    History of Present Illness Patient is a 76 y/o female who presents with back pain. Thoracic spine XRAY- New superior endplate compression fracture deformity at L1. D/ced home from rehab ~2 weeks ago from T12 kyphoplasty in August. PMH includes NSTEMI, CHF, DVT, PE, neuropathy.     PT Comments    Patient continued to demonstrate dropping BP during session. Patients BP stats were as follows: 110/71 at beginning of session while lying in bed, 99/61 while sitting on EOB, dropped to 87/52 in standing after patient went to the bathroom, and 97/65 after amb. back to chair. Patient's HR was 128 at highest point and O2 sats were in the low 80s intermittently during session on 4 L of O2. Patient reports that she is asymptomatic when vitals are not WNL and therefore will benefit from supervision after d/c in order to ensure safety during household mobility. Physical Therapy will continue to follow to maximize mobility.  Follow Up Recommendations  Home health PT;Supervision for mobility/OOB     Equipment Recommendations  None recommended by PT    Recommendations for Other Services OT consult     Precautions / Restrictions Precautions Precautions: Fall;Back Precaution Booklet Issued: No Restrictions Weight Bearing Restrictions: No    Mobility  Bed Mobility Overal bed mobility: Needs Assistance Bed Mobility: Rolling;Sidelying to Sit Rolling: Min guard Sidelying to sit: Min guard;HOB elevated      General bed mobility comments: Patient used rail to help with log roll technique.  Transfers Overall transfer level: Needs assistance Equipment used: Rolling walker (2 wheeled) Transfers: Sit to/from Stand Sit to Stand: Min guard;Mod assist         General transfer comment: Patient needed bed to be adjusted to a higher height in order to stand. patient required mod  assist to sit and then to stand from commode due to low height and pain in back.  Ambulation/Gait Ambulation/Gait assistance: Min guard Ambulation Distance (Feet): 14 Feet (+14'+12') Assistive device: Rolling walker (2 wheeled) Gait Pattern/deviations: Trunk flexed;Wide base of support Gait velocity: decreased Gait velocity interpretation: Below normal speed for age/gender General Gait Details: Patient amb. in short sequences of 12-14 feet to amb. to bathroom and then to room chair. patient required min guard to manage various lines and in order to ensurebalance and safety during gait, as well as monitor vitals and cue patient on how to counteract changes.    Stairs            Wheelchair Mobility    Modified Rankin (Stroke Patients Only)       Balance Overall balance assessment: Needs assistance Sitting-balance support: Feet supported;Single extremity supported Sitting balance-Leahy Scale: Fair Sitting balance - Comments: Using UE support as position of comfort for back pain.   Standing balance support: During functional activity;Bilateral upper extremity supported   Standing balance comment: patient uses walker for UE support and balance in standing. patient also maintains forward trunk flexion in standing.                            Cognition Arousal/Alertness: Awake/alert Behavior During Therapy: WFL for tasks assessed/performed Overall Cognitive Status: Within Functional Limits for tasks assessed  Exercises General Exercises - Lower Extremity Long Arc Quad: AROM;Both;5 reps (Limited due to pain in back) Hip Flexion/Marching: AROM;Both;5 reps;Other (comment) (Limited due to pain in back)    General Comments        Pertinent Vitals/Pain Pain Assessment: 0-10 Pain Score: 8  Pain Location: back Pain Descriptors / Indicators: Sore;Aching Pain Intervention(s): Limited activity within patient's  tolerance;Monitored during session;Premedicated before session    Home Living                      Prior Function            PT Goals (current goals can now be found in the care plan section) Progress towards PT goals:  (Low blood pressure and O2 sats)    Frequency    Min 3X/week      PT Plan Current plan remains appropriate    Co-evaluation              AM-PAC PT "6 Clicks" Daily Activity  Outcome Measure  Difficulty turning over in bed (including adjusting bedclothes, sheets and blankets)?: A Little Difficulty moving from lying on back to sitting on the side of the bed? : Unable Difficulty sitting down on and standing up from a chair with arms (e.g., wheelchair, bedside commode, etc,.)?: A Little Help needed moving to and from a bed to chair (including a wheelchair)?: A Little Help needed walking in hospital room?: A Little Help needed climbing 3-5 steps with a railing? : A Little 6 Click Score: 16    End of Session Equipment Utilized During Treatment: Gait belt;Oxygen Activity Tolerance: Patient limited by pain;Other (comment) Patient left: in chair;with call bell/phone within reach Nurse Communication: Mobility status;Other (comment) (Low blood pressure and low O2 sats) PT Visit Diagnosis: Unsteadiness on feet (R26.81);Difficulty in walking, not elsewhere classified (R26.2);Pain     Time: 7858-8502 PT Time Calculation (min) (ACUTE ONLY): 35 min  Charges:  $Gait Training: 8-22 mins $Therapeutic Activity: 8-22 mins                    G Codes:       Meeyah Ovitt SPT   Errin Chewning 02/08/2017, 11:01 AM

## 2017-02-08 NOTE — Progress Notes (Signed)
Pt is in to much pain at this time and refused Chest vest. Flutter is at bedside.

## 2017-02-09 DIAGNOSIS — Q893 Situs inversus: Secondary | ICD-10-CM

## 2017-02-09 DIAGNOSIS — I5022 Chronic systolic (congestive) heart failure: Secondary | ICD-10-CM | POA: Diagnosis not present

## 2017-02-09 DIAGNOSIS — S32010A Wedge compression fracture of first lumbar vertebra, initial encounter for closed fracture: Secondary | ICD-10-CM | POA: Diagnosis not present

## 2017-02-09 DIAGNOSIS — E1169 Type 2 diabetes mellitus with other specified complication: Secondary | ICD-10-CM

## 2017-02-09 DIAGNOSIS — J9611 Chronic respiratory failure with hypoxia: Secondary | ICD-10-CM | POA: Diagnosis not present

## 2017-02-09 DIAGNOSIS — J984 Other disorders of lung: Secondary | ICD-10-CM | POA: Diagnosis not present

## 2017-02-09 DIAGNOSIS — N183 Chronic kidney disease, stage 3 (moderate): Secondary | ICD-10-CM | POA: Diagnosis not present

## 2017-02-09 DIAGNOSIS — M4850XD Collapsed vertebra, not elsewhere classified, site unspecified, subsequent encounter for fracture with routine healing: Secondary | ICD-10-CM | POA: Diagnosis not present

## 2017-02-09 DIAGNOSIS — J479 Bronchiectasis, uncomplicated: Secondary | ICD-10-CM | POA: Diagnosis not present

## 2017-02-09 DIAGNOSIS — R52 Pain, unspecified: Secondary | ICD-10-CM | POA: Diagnosis not present

## 2017-02-09 DIAGNOSIS — Q240 Dextrocardia: Secondary | ICD-10-CM | POA: Diagnosis not present

## 2017-02-09 DIAGNOSIS — M545 Low back pain: Secondary | ICD-10-CM | POA: Diagnosis not present

## 2017-02-09 DIAGNOSIS — M069 Rheumatoid arthritis, unspecified: Secondary | ICD-10-CM | POA: Diagnosis not present

## 2017-02-09 LAB — GLUCOSE, CAPILLARY
GLUCOSE-CAPILLARY: 110 mg/dL — AB (ref 65–99)
Glucose-Capillary: 129 mg/dL — ABNORMAL HIGH (ref 65–99)

## 2017-02-09 LAB — PROTIME-INR
INR: 1.4
Prothrombin Time: 17 seconds — ABNORMAL HIGH (ref 11.4–15.2)

## 2017-02-09 MED ORDER — GUAIFENESIN ER 600 MG PO TB12: 600.0000 mg | ORAL_TABLET | Freq: Two times a day (BID) | ORAL | 2 refills | Status: AC

## 2017-02-09 MED ORDER — GUAIFENESIN 100 MG/5ML PO SOLN: 10.0000 mL | Freq: Three times a day (TID) | ORAL | 0 refills | Status: AC

## 2017-02-09 MED ORDER — CALCIUM CARBONATE ANTACID 500 MG PO CHEW: 1.0000 | CHEWABLE_TABLET | Freq: Every day | ORAL | 3 refills | Status: AC

## 2017-02-09 MED ORDER — SODIUM CHLORIDE 3 % IN NEBU: 4.0000 mL | INHALATION_SOLUTION | Freq: Two times a day (BID) | RESPIRATORY_TRACT | 12 refills | Status: AC

## 2017-02-09 MED ORDER — WARFARIN SODIUM 2 MG PO TABS: 2.0000 mg | ORAL_TABLET | Freq: Once | ORAL | Status: DC

## 2017-02-09 MED ORDER — WARFARIN SODIUM 2 MG PO TABS: ORAL_TABLET | ORAL | 3 refills | Status: AC

## 2017-02-09 NOTE — Discharge Summary (Signed)
Physician Discharge Summary  [name] ZOX:[id number] DOB: Jul 03, 1940 DOA: 02/05/2017  PCP: Elias Else, MD  Admit date: 02/05/2017 Discharge date: 02/09/2017  Admitted From: home Disposition:  home   Recommendations for Outpatient Follow-up:  1. F/u INR and adjust dose of Coumadin as needed  Home Health: ordered    Discharge Condition:  stable   CODE STATUS:  Full code   Consultations:  Cardiology  pulmonary    Discharge Diagnoses:  Active Problems:   SVT (supraventricular tachycardia) (HCC)   Intractable pain   Closed compression fracture of L1 lumbar vertebra (HCC)   Chronic systolic heart failure (HCC)   Dextrocardia   CAD   DM2 (diabetes mellitus, type 2) (HCC)   Saddle embolus of pulmonary artery (HCC)   Primary ciliary dyskinesia   Rheumatoid arthritis (HCC)   Decubital ulcer   CKD (chronic kidney disease) stage 3, GFR 30-59 ml/min   Chronic bronchiectasis not affecting current episode of care (HCC)   Kartagener syndrome    Subjective: Dilaudid continues to control her pain  Brief Summary: [name] 76 y.o.femalewith medical history significant for situs inversus totalis, 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  She presents for back pain for 3 days with no acute trauma. Had kyphoplasty of T12 on 8/1 and back pain improved until now. Xray shows a new L1 compression fx.  WBC upt from 7.9 on 8/2 to 18.4. She has draining sacral decubitus ulcers which developed while in rehab after surgery and was stared on Doxycycline which she has taken for 4/7 days pior to admission. She has a chronic cough which is now worse but non productive.  CT chest shows:  Bilateral lower lobe bronchiectasis with chronic areas of consolidation and bronchial thickening. Scattered ground-glass foci and tree-in-bud nodularity  within the upper and lower lobes,compatible with respiratory infection, consider atypical organisms.  Found to have an irregular HR with SVT in the hospital which is new.  She is admitted for pain control, management of leukocytosis and arrhythmias.  Hospital Course:  Principal Problem:   Acute back pain - L1 superior endplate fx with 30% loss of height - s/p T 12 augmentation by Dr Rosita Kea in [place] - point tenderness in L1 noted- will try pain control -  Hydrocodone and Oxycodone cause nausea in her and therefore we tried Dilaudid which she had done fine with in the ER - cont fosamax and Vit D- adding Calcium on discharge - Tolerating Dilaudid but stated it wears off too early and then pain is severe- now on Q 4 Dilaudid - this does control her pain but may be the cause of her dizziness when she walk -her orthostatic vitals are negative and her pulse ox is fine - she is really only getting up once a day in the hospital- PT still recommends HHPT - she has a f/u with Dr Rosita Kea in 2 days   Active Problems: SVT - has had 2 episodes so far despite taking home dose of Metoprolol 25 mg BID - when not in SVT, EKG was showing variating P waves - she was dehydrated per U sp gravity and so I have given her a 500 cc NS bolus - cardiology called- she see's Dr Jaye Beagle - Metoprolol increased to 50 TID per cardiology but BP dropped to 80s- improved after changing back to home dose- no recurrence of SVT  -  TFTs show a mild elevation in TSH and FT4- will not treat- repeat in about 2 months  Hypoxia - 9/8 pulse ox in 80s on 4 L on exertion  - asked for pulm eval as CT showed possible infection - recommended hypertonic nebs, mucninex and cont flutter valve - no antibiotics ordered - pulse ox today on exertion is much better at 98-100 % on her usual O2    Chronic systolic heart failure  - EF 40-45% in the past but improved to normal on last ECHO - cont Metoprolol- not on ACE I - as EF is now normal,  can hold off ACE I - not fluid overloaded but was actually dehydrated on admission  Small decubitus ulcers - stage 3- no drainage noted- appreciate WOC eval>> Santyl and air mattress - Doxy stopped on 9/7 for 7 day course  Leukocytosis - ? Due to ulcers vs stress from pain? CT chest changes noted but she has no pulmonary symptoms - cont Doxy    CAD s/p CABG - Aspirin    DM2 (diabetes mellitus, type 2)  - hold Metformin due to CT with contrast - ISS TID    Saddle embolus of pulmonary artery  - Coumadin per pharmacy- INR subtherapeutic    Primary ciliary dyskinesia with chronic bronchiectasis - no changes in chronic cough-  - CT changes noted but as she has no new symptoms, d/c'd Meropenem  CKD 3 - stable    Rheumatoid arthritis  - Methotrexate  Gout - Allopurinol  Discharge Instructions  Discharge Instructions    Diet - low sodium heart healthy    Complete by:  As directed    Diet - low sodium heart healthy    Complete by:  As directed    Diet Carb Modified    Complete by:  As directed    Increase activity slowly    Complete by:  As directed    Increase activity slowly    Complete by:  As directed      Allergies as of 02/09/2017      Reactions   Penicillins Rash   Has patient had a PCN reaction causing immediate rash, facial/tongue/throat swelling, SOB or lightheadedness with hypotension: Yes Has patient had a PCN reaction causing severe rash involving mucus membranes or skin necrosis: No Has patient had a PCN reaction that required hospitalization: No Has patient had a PCN reaction occurring within the last 10 years: No If all of the above answers are "NO", then may proceed with Cephalosporin use.   Shellfish Allergy Anaphylaxis   Rash    Hydrocodone Other (See Comments)   Pt says "it's too high powered so it sends me out to space"   Oxycodone Other (See Comments)   Pt says "it's too high powered so it sends me out to space"   Levaquin  [levofloxacin] Hives      Medication List    STOP taking these medications   doxycycline 100 MG capsule Commonly known as:  VIBRAMYCIN     TAKE these medications   acetaminophen 160 MG/5ML elixir Commonly known as:  TYLENOL Take 320 mg by mouth every 6 (six) hours.   alendronate 70 MG tablet Commonly known as:  FOSAMAX Take 70 mg by mouth every Sunday.   allopurinol 100 MG tablet Commonly known as:  ZYLOPRIM Take 200 mg by mouth daily.   aspirin EC 81 MG tablet Take 81 mg by mouth daily.   calcium carbonate 500 MG chewable tablet Commonly known as:  TUMS Chew 1 tablet (200 mg of elemental calcium total) by mouth daily.   cholecalciferol 1000 units tablet Commonly known as:  VITAMIN D Take 1,000 Units by mouth daily.   collagenase ointment Commonly known as:  SANTYL Apply topically daily.   FLUTTER Devi Use 10-15 times daily   folic acid 800 MCG tablet Commonly known as:  FOLVITE Take 800 mcg by mouth daily.   furosemide 40 MG tablet Commonly known as:  LASIX Take 1 tablet (40 mg total) by mouth daily as needed for fluid or edema. Take only if you gain 2-3 lb of fluid in a 24 hr period What changed:  when to take this  reasons to take this  additional instructions   gabapentin 300 MG capsule Commonly known as:  NEURONTIN Take 300 mg by mouth 2 (two) times daily.   guaiFENesin 100 MG/5ML Soln Commonly known as:  ROBITUSSIN Take 10 mLs (200 mg total) by mouth 3 (three) times daily.   guaiFENesin 600 MG 12 hr tablet Commonly known as:  MUCINEX Take 1 tablet (600 mg total) by mouth 2 (two) times daily.   HYDROmorphone 2 MG tablet Commonly known as:  DILAUDID Take 0.5 tablets (1 mg total) by mouth every 4 (four) hours as needed for severe pain.   ipratropium-albuterol 0.5-2.5 (3) MG/3ML Soln Commonly known as:  DUONEB Take 3 mLs by nebulization every 6 (six) hours as needed. What changed:  reasons to take this   metFORMIN 500 MG 24 hr  tablet Commonly known as:  GLUCOPHAGE-XR Take 500 mg by mouth daily as needed (low blood sugar).   methotrexate 2.5 MG tablet Commonly known as:  RHEUMATREX Take 20 mg by mouth every Monday.   metoprolol tartrate 25 MG tablet Commonly known as:  LOPRESSOR Take 25 mg by mouth 2 (two) times daily.   omeprazole 20 MG capsule Commonly known as:  PRILOSEC Take 20 mg by mouth every morning.   polyethylene glycol packet Commonly known as:  MIRALAX / GLYCOLAX Take 17 g by mouth daily as needed for mild constipation.   simvastatin 20 MG tablet Commonly known as:  ZOCOR Take 20 mg by mouth every evening.   sodium chloride HYPERTONIC 3 % nebulizer solution Take 4 mLs by nebulization 2 (two) times daily.   warfarin 2 MG tablet Commonly known as:  COUMADIN Take 6 mg for 3 days. RN should come to your home to check the INR on Tuesday. What changed:  additional instructions            Discharge Care Instructions        Start     Ordered   02/09/17 0000  guaiFENesin (ROBITUSSIN) 100 MG/5ML SOLN  3 times daily     02/09/17 0856   02/09/17 0000  sodium chloride HYPERTONIC 3 % nebulizer solution  2 times daily     02/09/17 0856   02/09/17 0000  guaiFENesin (MUCINEX) 600 MG 12 hr tablet  2 times daily     02/09/17 0856   02/09/17 0000  warfarin (COUMADIN) 2 MG tablet    Comments:  65 tabs is a 1 month supply   02/09/17 0859   02/09/17 0000  calcium carbonate (TUMS) 500 MG chewable tablet  Daily     02/09/17 1207   02/09/17 0000  Increase activity slowly     02/09/17 1207   02/09/17 0000  Diet - low sodium heart healthy     02/09/17 1207   02/09/17 0000  Diet Carb  Modified     02/09/17 1207   02/08/17 0000  collagenase (SANTYL) ointment  Daily     02/08/17 0844   02/08/17 0000  polyethylene glycol (MIRALAX / GLYCOLAX) packet  Daily PRN     02/08/17 0844   02/08/17 0000  HYDROmorphone (DILAUDID) 2 MG tablet  Every 4 hours PRN     02/08/17 0844   02/08/17 0000  Increase  activity slowly     02/08/17 0844   02/08/17 0000  Diet - low sodium heart healthy     02/08/17 0844   02/08/17 0000  furosemide (LASIX) 40 MG tablet  Daily PRN     02/08/17 0856     Follow-up Information    Kennedy Bucker, MD Follow up on 02/11/2017.   Specialty:  Orthopedic Surgery Why:  at 0930 am  Contact information: [address] [facility] [place] [address] [phone]        Jobe Gibbon, [facility] Follow up.   Specialty:  Home Health Services Why:  Registered Nurse and Physical Therapy Contact information: [address] [place] [address] [phone]        Elias Else, MD Follow up.   Specialty:  Family Medicine Why:    Contact information: [phone] [place] [address] [phone]          Allergies  Allergen Reactions  . Penicillins Rash    Has patient had a PCN reaction causing immediate rash, facial/tongue/throat swelling, SOB or lightheadedness with hypotension: Yes Has patient had a PCN reaction causing severe rash involving mucus membranes or skin necrosis: No Has patient had a PCN reaction that required hospitalization: No Has patient had a PCN reaction occurring within the last 10 years: No If all of the above answers are "NO", then may proceed with Cephalosporin use.   . Shellfish Allergy Anaphylaxis    Rash   . Hydrocodone Other (See Comments)    Pt says "it's too high powered so it sends me out to space"  . Oxycodone Other (See Comments)    Pt says "it's too high powered so it sends me out to space"  . Levaquin [Levofloxacin] Hives     Procedures/Studies:    Dg Chest 2 View  Result Date: 02/05/2017 CLINICAL DATA:  Back pain EXAM: CHEST  2 VIEW COMPARISON:  10/26/2016 FINDINGS: Bilateral lower lobe scarring/ atelectasis. No pleural effusion or pneumothorax. The heart is normal in size. Situs inversus. Postsurgical changes related to prior CABG. Median  sternotomy. Thoracolumbar spine is described on dedicated radiographs. IVC filter. IMPRESSION: Thoracolumbar spine is described on dedicated radiographs. No evidence of acute cardiopulmonary disease. Electronically Signed   By: Charline Bills M.D.   On: 02/05/2017 20:31   Dg Thoracic Spine 2 View  Result Date: 02/05/2017 CLINICAL DATA:  Back pain, recent vertebroplasty EXAM: THORACIC SPINE 2 VIEWS COMPARISON:  MRI thoracic spine dated 12/31/2016 FINDINGS: Prior vertebral augmentation at T12. Mild degenerative changes of the thoracic spine. New superior endplate compression fracture deformity at L1 with approximately 30% loss of height. No retropulsion. IMPRESSION: New superior endplate compression fracture deformity at L1 with approximately 30% loss of height. No retropulsion. Prior vertebral augmentation at T12. Electronically Signed   By: Charline Bills M.D.   On: 02/05/2017 20:30   Dg Lumbar Spine 2-3 Views  Result Date: 02/05/2017 CLINICAL DATA:  Severe back pain, recent vertebroplasty EXAM: LUMBAR SPINE - 2-3 VIEW COMPARISON:  None. FINDINGS:  Prior vertebral augmentation at T12. New superior endplate compression fracture deformity at L1 with approximately 30% loss of height. No retropulsion. Mild degenerative changes of the upper lumbar spine. IVC filter. IMPRESSION: New superior endplate compression fracture deformity at L1 with approximately 30% loss of height. No retropulsion. Electronically Signed   By: Charline Bills M.D.   On: 02/05/2017 20:32   Ct Angio Chest Pe W And/or Wo Contrast  Result Date: 02/05/2017 CLINICAL DATA:  Back pain with recent surgery EXAM: CT ANGIOGRAPHY CHEST WITH CONTRAST TECHNIQUE: Multidetector CT imaging of the chest was performed using the standard protocol during bolus administration of intravenous contrast. Multiplanar CT image reconstructions and MIPs were obtained to evaluate the vascular anatomy. CONTRAST:  100 mL Isovue 370 intravenous COMPARISON:   Radiograph 02/05/2017, CT chest 05/26/2013, CT 12/30/2016 FINDINGS: Cardiovascular: Satisfactory opacification of the pulmonary arteries to the segmental level. No evidence of pulmonary embolism. Atherosclerotic calcifications of the aorta. No aneurysmal dilatation. Right-sided aortic arch with mirror image branching of the great vessels. No dissection is seen. Dextrocardia. Coronary artery calcification. No large pericardial effusion. Mediastinum/Nodes: Midline trachea. No thyroid. No significantly enlarged lymph nodes. Distal hiatal hernia. Lungs/Pleura: Bronchiectasis in the bilateral lower lobes with bronchial thickening and consolidation. Scattered ground-glass foci in the left upper lobe with mild tree-in-bud nodularity within the bilateral upper lobes and left greater than right lower lobes. No pleural effusion. No pneumothorax. Upper Abdomen:  Situs in versus.  Pneumobilia re- demonstrated. Musculoskeletal: Sternotomy changes. No acute or suspicious bone lesion Review of the MIP images confirms the above findings. IMPRESSION: 1. Negative for acute pulmonary embolus or aortic dissection 2. 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 Dextrocardia. 3. Bilateral lower lobe bronchiectasis with chronic areas of consolidation and bronchial thickening. Scattered ground-glass foci and tree-in-bud nodularity within the upper and lower lobes, compatible with respiratory infection, consider atypical organisms. 4. Situs  inversus.  Pneumobilia, postsurgical Aortic Atherosclerosis (ICD10-I70.0). Electronically Signed   By: Jasmine Pang M.D.   On: 02/05/2017 23:02       Discharge Exam: Vitals:   02/09/17 0940 02/09/17 0941  BP: 110/70 110/70  Pulse:    Resp:    Temp:    SpO2:     Vitals:   02/09/17 0500 02/09/17 0849 02/09/17 0940 02/09/17 0941  BP: (!) 125/56  110/70 110/70  Pulse: 88     Resp: (!) 23     Temp: 97.8 F (36.6 C)     TempSrc:  Axillary     SpO2: 95% 97%    Weight: 68 kg (150 lb)     Height:        General: Pt is alert, awake, not in acute distress Cardiovascular: RRR, S1/S2 +, no rubs, no gallops Respiratory: CTA bilaterally, no wheezing, no rhonchi Abdominal: Soft, NT, ND, bowel sounds + Extremities: no edema, no cyanosis    The results of significant diagnostics from this hospitalization (including imaging, microbiology, ancillary and laboratory) are listed below for reference.     Microbiology: Recent Results (from the past 240 hour(s))  Culture, blood (routine x 2)     Status: None (Preliminary result)   Collection Time: 02/06/17  2:36 AM  Result Value Ref Range Status   Specimen Description BLOOD RIGHT HAND  Final   Special Requests IN PEDIATRIC BOTTLE Blood Culture adequate volume  Final   Culture NO GROWTH 3 DAYS  Final   Report Status PENDING  Incomplete  Culture, blood (routine x  2)     Status: None (Preliminary result)   Collection Time: 02/06/17  3:40 AM  Result Value Ref Range Status   Specimen Description BLOOD LEFT ANTECUBITAL  Final   Special Requests IN PEDIATRIC BOTTLE Blood Culture adequate volume  Final   Culture NO GROWTH 3 DAYS  Final   Report Status PENDING  Incomplete     Labs: BNP (last 3 results)  Recent Labs  02/05/17 1707  BNP 39.5   Basic Metabolic Panel:  Recent Labs Lab 02/05/17 1707 02/05/17 1715 02/07/17 1227  NA  --  138 137  K  --  3.6 4.0  CL  --  97* 97*  CO2  --  28 32  GLUCOSE  --  145* 107*  BUN  --  25* 16  CREATININE  --  1.23* 0.96  CALCIUM  --  9.3 8.7*  MG 1.6*  --  2.1   Liver Function Tests: No results for input(s): AST, ALT, ALKPHOS, BILITOT, PROT, ALBUMIN in the last 168 hours. No results for input(s): LIPASE, AMYLASE in the last 168 hours. No results for input(s): AMMONIA in the last 168 hours. CBC:  Recent Labs Lab 02/05/17 1715 02/07/17 1109  WBC 18.4* 12.7*  HGB 12.1 11.0*  HCT 37.9 35.0*  MCV 101.6* 102.6*  PLT  402* 370   Cardiac Enzymes:  Recent Labs Lab 02/05/17 1707  TROPONINI <0.03   BNP: Invalid input(s): POCBNP CBG:  Recent Labs Lab 02/08/17 1200 02/08/17 1647 02/08/17 1959 02/09/17 0748 02/09/17 1201  GLUCAP 146* 117* 111* 110* 129*   D-Dimer No results for input(s): DDIMER in the last 72 hours. Hgb A1c No results for input(s): HGBA1C in the last 72 hours. Lipid Profile No results for input(s): CHOL, HDL, LDLCALC, TRIG, CHOLHDL, LDLDIRECT in the last 72 hours. Thyroid function studies  Recent Labs  02/07/17 0346  TSH 5.256*   Anemia work up No results for input(s): VITAMINB12, FOLATE, FERRITIN, TIBC, IRON, RETICCTPCT in the last 72 hours. Urinalysis    Component Value Date/Time   COLORURINE YELLOW 02/06/2017 0000   APPEARANCEUR CLEAR 02/06/2017 0000   APPEARANCEUR Hazy 05/25/2013 1613   LABSPEC 1.031 (H) 02/06/2017 0000   LABSPEC 1.009 05/25/2013 1613   PHURINE 6.0 02/06/2017 0000   GLUCOSEU NEGATIVE 02/06/2017 0000   GLUCOSEU Negative 05/25/2013 1613   HGBUR NEGATIVE 02/06/2017 0000   BILIRUBINUR NEGATIVE 02/06/2017 0000   BILIRUBINUR Negative 05/25/2013 1613   KETONESUR 5 (A) 02/06/2017 0000   PROTEINUR NEGATIVE 02/06/2017 0000   UROBILINOGEN 1.0 08/12/2011 2028   NITRITE NEGATIVE 02/06/2017 0000   LEUKOCYTESUR NEGATIVE 02/06/2017 0000   LEUKOCYTESUR 1+ 05/25/2013 1613   Sepsis Labs Invalid input(s): PROCALCITONIN,  WBC,  LACTICIDVEN Microbiology Recent Results (from the past 240 hour(s))  Culture, blood (routine x 2)     Status: None (Preliminary result)   Collection Time: 02/06/17  2:36 AM  Result Value Ref Range Status   Specimen Description BLOOD RIGHT HAND  Final   Special Requests IN PEDIATRIC BOTTLE Blood Culture adequate volume  Final   Culture NO GROWTH 3 DAYS  Final   Report Status PENDING  Incomplete  Culture, blood (routine x 2)     Status: None (Preliminary result)   Collection Time: 02/06/17  3:40 AM  Result Value Ref Range Status    Specimen Description BLOOD LEFT ANTECUBITAL  Final   Special Requests IN PEDIATRIC BOTTLE Blood Culture adequate volume  Final   Culture NO GROWTH 3 DAYS  Final  Report Status PENDING  Incomplete     Time coordinating discharge: Over 30 minutes  SIGNED:   Calvert Cantor, MD  [facility] 02/09/2017, 12:20 PM Pager   If 7PM-7AM, please contact night-coverage [email or web address] Password [email or web address]

## 2017-02-09 NOTE — Progress Notes (Addendum)
ANTICOAGULATION CONSULT NOTE  Pharmacy Consult for Warfarin Indication: h/o VTE  Allergies  Allergen Reactions  . Penicillins Rash    Has patient had a PCN reaction causing immediate rash, facial/tongue/throat swelling, SOB or lightheadedness with hypotension: Yes Has patient had a PCN reaction causing severe rash involving mucus membranes or skin necrosis: No Has patient had a PCN reaction that required hospitalization: No Has patient had a PCN reaction occurring within the last 10 years: No If all of the above answers are "NO", then may proceed with Cephalosporin use.   . Shellfish Allergy Anaphylaxis    Rash   . Hydrocodone Other (See Comments)    Pt says "it's too high powered so it sends me out to space"  . Oxycodone Other (See Comments)    Pt says "it's too high powered so it sends me out to space"  . Levaquin [Levofloxacin] Hives    Patient Measurements: Height: 5\' 2"  (157.5 cm) Weight: 150 lb (68 kg) IBW/kg (Calculated) : 50.1  Vital Signs: Temp: 97.8 F (36.6 C) (09/08 0500) Temp Source: Axillary (09/08 0500) BP: 125/56 (09/08 0500) Pulse Rate: 88 (09/08 0500)  Labs:  Recent Labs  02/07/17 0346 02/07/17 1109 02/07/17 1227 02/08/17 0310 02/09/17 0317  HGB  --  11.0*  --   --   --   HCT  --  35.0*  --   --   --   PLT  --  370  --   --   --   LABPROT 17.9*  --   --  17.7* 17.0*  INR 1.49  --   --  1.47 1.40  CREATININE  --   --  0.96  --   --     Estimated Creatinine Clearance: 46.5 mL/min (by C-G formula based on SCr of 0.96 mg/dL).   Medical History: Past Medical History:  Diagnosis Date  . Allergy   . Anemia   . Arthritis   . Blood transfusion without reported diagnosis   . CAD (coronary artery disease)    NSTEMI in setting of gallstone pancreatitis 05/2011:  LHC with 3v CAD; CABG was performed 06/14/11: RIMA-LAD, SVG-ramus, SVG-RCA  . Carotid stenosis    Dopplers 06/12/11: LICA 60-79%.  . Cataract   . CHF (congestive heart failure) (HCC)   .  Chronic back pain   . Chronic bronchitis   . Chronic kidney disease   . Chronic systolic heart failure (HCC)   . Collagen vascular disease (HCC)   . Dextrocardia   . Diabetes mellitus without complication (HCC)   . DVT of leg (deep venous thrombosis) (HCC) ~2006   left  . Fracture 05/24/2011   right; "did not have surgery"  . GERD (gastroesophageal reflux disease)   . H/O hiatal hernia   . HLD (hyperlipidemia)   . Hypertension   . Ischemic cardiomyopathy    Echocardiogram 06/05/11: EF 40-45%, anteroseptal hypokinesis, apical hypokinesis, mild LAE  . Myocardial infarction (HCC)   . Neuromuscular disorder (HCC)   . Neuropathy   . Osteoporosis   . Oxygen deficiency    2l  . Pneumonia 06/13/11   "couple times; long time ago"  . Pulmonary embolus [facility]) March 2013  . Rheumatoid arthritis (HCC)   . Situs inversus with dextrocardia    CT 06/2011: situs inversus totalis.  What would typically be called RCA arose from anterior sinus of Valsalva.  What would typically be called the left main arises from the posterior sinus of Valsalva and gives rise to a  large first diagonal branch and diminutive circumflex    Medications:  No current facility-administered medications on file prior to encounter.    Current Outpatient Prescriptions on File Prior to Encounter  Medication Sig Dispense Refill  . allopurinol (ZYLOPRIM) 100 MG tablet Take 200 mg by mouth daily.     Marland Kitchen aspirin EC 81 MG tablet Take 81 mg by mouth daily.    . cholecalciferol (VITAMIN D) 1000 UNITS tablet Take 1,000 Units by mouth daily.    . folic acid (FOLVITE) 800 MCG tablet Take 800 mcg by mouth daily.     Marland Kitchen gabapentin (NEURONTIN) 300 MG capsule Take 300 mg by mouth 2 (two) times daily.      Marland Kitchen ipratropium-albuterol (DUONEB) 0.5-2.5 (3) MG/3ML SOLN Take 3 mLs by nebulization every 6 (six) hours as needed. (Patient taking differently: Take 3 mLs by nebulization every 6 (six) hours as needed (wheezing/sob). ) 360 mL 5  . metFORMIN  (GLUCOPHAGE-XR) 500 MG 24 hr tablet Take 500 mg by mouth daily as needed (low blood sugar).     . methotrexate (RHEUMATREX) 2.5 MG tablet Take 20 mg by mouth every Monday.     . metoprolol tartrate (LOPRESSOR) 25 MG tablet Take 25 mg by mouth 2 (two) times daily.    Marland Kitchen omeprazole (PRILOSEC) 20 MG capsule Take 20 mg by mouth every morning.      . simvastatin (ZOCOR) 20 MG tablet Take 20 mg by mouth every evening.     . warfarin (COUMADIN) 2 MG tablet Take as directed by Coumadin Clinic (Patient taking differently: Take 2-4 mg by mouth See admin instructions. Take 2mg  on Sat/Sun and 4mg  on Mon-Fri.) 65 tablet 3  . alendronate (FOSAMAX) 70 MG tablet Take 70 mg by mouth every Sunday.     Respiratory Therapy Supplies (FLUTTER) DEVI Use 10-15 times daily 1 each 0    Assessment: 76 y.o. female admitted with back pain/compression fracture, h/o PE/DVT. INR was subtherapeutic at 1.34 on admission. PTA regimen (as of note from 9/4) is 2 mg daily except 4 mg on Monday and Friday.  Patient was not on a bridge PTA. No signs/sx of bleeding noted. Variable PO intake yesterday. Drug-drug interaction noted with warfarin and doxycycline. -INR today is 1.4  Goal of Therapy:  INR 2-3 Monitor platelets by anticoagulation protocol: Yes   Plan:  Warfarin 2 mg PO x 1 Daily INR, CBCs F/u s/sx bleeding, PO intake, DDI Will defer to team if want to bridge while INR is subtherapeutic since being used for history of DVT and not active DVT.  Friday, PharmD PGY1 Acute Care Pharmacy Resident Pager: [phone] 8:05 AM 02/09/2017

## 2017-02-09 NOTE — Progress Notes (Signed)
Pt ambulated using FW Walker about 80 ft, making it only about 10 feet out of her door before she started to c/o feeling, "Like I'm going to fall and dizzy".  HR up to 120.  Did not require O2 titration, was able to stay on 2L which is her baseline at home.  SpO2 98-100% during ambulation.  Pt sitting up in Chair, Oxygen on 2L Nasal Canula.  BP 104/74 and HR=99 after walking.  Pt denied CP, only "panting for breath sometimes" during walk.

## 2017-02-09 NOTE — Progress Notes (Signed)
Pt d/c home and continues to c/o back pain.  RX for hydromorphone given to pt and sister who is at bedside.  All d/c papers and RX inside packet/folder and sister aware.  They will fill at pharmacy as soon as they leave hospital.  All f/u appointments and med instructions reviewed in detail with pt and sister.  All belongings with pt.

## 2017-02-09 NOTE — Discharge Instructions (Signed)
Continue to apply Santyl to the wounds and do not lay or sit on them too much or they will not heal.  The home health nurses will need to do blood work to get an INR on Tuesday and your doctor will tell you if to change your Coumadin dosing. Lasix should be only used as needed for fluid weight gain as your heart failure has improved. Use Dilaudid in moderation and prevent constipation with laxatives as needed. Dilaudid may be what is making you dizzy when you get out of bed.   Please take all your medications with you for your next visit with your Primary MD. Please request your Primary MD to go over all hospital test results at the follow up. Please ask your Primary MD to get all Hospital records sent to his/her office.  If you experience worsening of your admission symptoms, develop shortness of breath, chest pain, suicidal or homicidal thoughts or a life threatening emergency, you must seek medical attention immediately by calling 911 or calling your MD.  Bonita Quin must read the complete instructions/literature along with all the possible adverse reactions/side effects for all the medicines you take including new medications that have been prescribed to you. Take new medicines after you have completely understood and accpet all the possible adverse reactions/side effects.   Do not drive when taking pain medications or sedatives.    Do not take more than prescribed Pain, Sleep and Anxiety Medications  If you have smoked or chewed Tobacco in the last 2 yrs please stop. Stop any regular alcohol and or recreational drug use.  Wear Seat belts while driving.

## 2017-02-09 NOTE — Progress Notes (Signed)
Breathing okay.  Still has globus sensation.  BP 104/74 (BP Location: Left Arm)   Pulse (!) 107   Temp 97.8 F (36.6 C) (Axillary)   Resp (!) 21   Ht 5\' 2"  (1.575 m)   Wt 150 lb (68 kg)   SpO2 98%   BMI 27.44 kg/m   Pleasant.  HR regular.  No wheeze.  CMP Latest Ref Rng & Units 02/07/2017 02/05/2017 12/31/2016  Glucose 65 - 99 mg/dL 01/02/2017) 885(O) 86  BUN 6 - 20 mg/dL 16 277(A) 15  Creatinine 0.44 - 1.00 mg/dL 12(I 7.86) 7.67(M)  Sodium 135 - 145 mmol/L 137 138 143  Potassium 3.5 - 5.1 mmol/L 4.0 3.6 3.3(L)  Chloride 101 - 111 mmol/L 97(L) 97(L) 103  CO2 22 - 32 mmol/L 32 28 34(H)  Calcium 8.9 - 10.3 mg/dL 0.94(B) 9.3 0.9(G)  Total Protein 6.5 - 8.1 g/dL - - -  Total Bilirubin 0.3 - 1.2 mg/dL - - -  Alkaline Phos 38 - 126 U/L - - -  AST 15 - 41 U/L - - -  ALT 14 - 54 U/L - - -   CBC Latest Ref Rng & Units 02/07/2017 02/05/2017 01/03/2017  WBC 4.0 - 10.5 K/uL 12.7(H) 18.4(H) 7.9  Hemoglobin 12.0 - 15.0 g/dL 11.0(L) 12.1 11.1(L)  Hematocrit 36.0 - 46.0 % 35.0(L) 37.9 33.5(L)  Platelets 150 - 400 K/uL 370 402(H) 245    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 - continue sinus regimen  03/05/2017, MD [facility] Pulmonary/Critical Care 02/09/2017, 1:11 PM Pager:  [phone] After 3pm call: [phone]

## 2017-02-11 ENCOUNTER — Telehealth: Payer: Self-pay | Admitting: Cardiovascular Disease

## 2017-02-11 ENCOUNTER — Other Ambulatory Visit: Payer: Self-pay | Admitting: *Deleted

## 2017-02-11 DIAGNOSIS — S32010A Wedge compression fracture of first lumbar vertebra, initial encounter for closed fracture: Secondary | ICD-10-CM | POA: Diagnosis not present

## 2017-02-11 LAB — CULTURE, BLOOD (ROUTINE X 2)
CULTURE: NO GROWTH
Culture: NO GROWTH
SPECIAL REQUESTS: ADEQUATE
Special Requests: ADEQUATE

## 2017-02-11 NOTE — Telephone Encounter (Signed)
I am not sure what this means. Her mother in law needs an INR check? Thayer Ohm

## 2017-02-11 NOTE — Telephone Encounter (Signed)
They are going to operate on coumadin? I have no problem with checking an INR. Can you help with the order for the INR at home? She would need bridging with Lovenox if her coumadin was held for the procedure. Thayer Ohm

## 2017-02-11 NOTE — Telephone Encounter (Signed)
[employer]

## 2017-02-11 NOTE — Patient Outreach (Addendum)
[facility]) Care Management  02/11/2017  [name] 12/28/1940 [id number]   Transition of care call  Patient with recent discharge with observation stay at [facility] on 9/8. Dx: Closed Fracture L1,sacral wound   Patient reports being tired at this time, she discussed visit to Dr.Menz office this morning to follow up on back pain. She reports plans for back surgery on Friday, 9/14, if coumadin level down,she reports MD told her to stop taking coumadin for remainder of week .  Patient reports home health has spoken with her and plan for visit again on 9/11 and to draw blood for coumadin and change dressing to sacral area.  Patient reports she was instructed by Dr.Menz not to take dilaudid for pain to only take tylenol.   Patient reports she has her flutter valve and has been using as well as taking nebulizer treatment as needed. Patient discussed unable to use pulmonary vest due to discomfort in back.   Patient reports she is tolerating using walker to ambulate around apartment, and to living room to lift chair. Patient discussed she is wearing oxygen, and recent saturation was 91% this morning without increased shortness of breath. Patient discussed she is eating a little better, still drinking ensure. Discussed with patient importance of not staying in one position too long, change position at least 1 to 2 hours,, shift weight while sitting, stand up as tolerated every hour while sitting in recliner chair.   Discussed with patient post hospital visit with PCP, offered assistance with scheduling visit, she wants to wait until after her planned surgery this week.   Prior to completing call,phone call lost, attempted return call X 2 , phone just ringing.   Plan Will place follow up call to patient in the next 3 days . Patient will notify MD for increased pain, symptoms of dizziness, shortness of breath  Egbert Garibaldi, RN, [facility] Care Management,Care  Management Coordinator  [phone]- Mobile [phone]- Toll Free Main Office

## 2017-02-11 NOTE — Telephone Encounter (Signed)
Called pt's daughter in law Neysa Bonito) who states pt was scheduled last minute for a kyphopasty this Friday 9/14. Pt does need to hold Coumadin for 5 days prior. Discussed need for Lovenox bridging given hx of saddle PE. Daughter in law initially resistant because she states pt has previously held Coumadin without Lovenox bridge. Again discussed the risk of holding Coumadin without Lovenox bridge given cardiac hx and she did become more agreeable.  Home health is scheduled to recheck INR tomorrow on 9/11. Will coordinate Lovenox bridging over the phone. SCr is normal, pt weighs 68 kg. 1mg /kg BID dosing would require rounding to a syringe strength, will dose at 1.5mg /kg once daily (102kg) instead and dose Lovenox 100mg  once daily.

## 2017-02-11 NOTE — Telephone Encounter (Signed)
Per pt's daughter in law pt needs to have surgery Friday for fractured vertebrae and md doing procedure would like inr  checked on Thursday to make sure pt is theraputic prior to doing procedure .Is this okay ?

## 2017-02-11 NOTE — Telephone Encounter (Signed)
Mrs. Acero is calling to get an order for the home health nurse to come out and check her mother -in -law coumadin , so that she may have surgery on Thursday . Please call

## 2017-02-12 ENCOUNTER — Inpatient Hospital Stay: Admission: RE | Admit: 2017-02-12 | Payer: Medicare HMO | Source: Ambulatory Visit

## 2017-02-12 ENCOUNTER — Other Ambulatory Visit: Payer: Self-pay | Admitting: *Deleted

## 2017-02-12 DIAGNOSIS — M4854XD Collapsed vertebra, not elsewhere classified, thoracic region, subsequent encounter for fracture with routine healing: Secondary | ICD-10-CM | POA: Diagnosis not present

## 2017-02-12 DIAGNOSIS — M1990 Unspecified osteoarthritis, unspecified site: Secondary | ICD-10-CM | POA: Diagnosis not present

## 2017-02-12 DIAGNOSIS — M109 Gout, unspecified: Secondary | ICD-10-CM | POA: Diagnosis not present

## 2017-02-12 DIAGNOSIS — I5022 Chronic systolic (congestive) heart failure: Secondary | ICD-10-CM | POA: Diagnosis not present

## 2017-02-12 DIAGNOSIS — L89312 Pressure ulcer of right buttock, stage 2: Secondary | ICD-10-CM | POA: Diagnosis not present

## 2017-02-12 DIAGNOSIS — E1140 Type 2 diabetes mellitus with diabetic neuropathy, unspecified: Secondary | ICD-10-CM | POA: Diagnosis not present

## 2017-02-12 DIAGNOSIS — L89322 Pressure ulcer of left buttock, stage 2: Secondary | ICD-10-CM | POA: Diagnosis not present

## 2017-02-12 DIAGNOSIS — I110 Hypertensive heart disease with heart failure: Secondary | ICD-10-CM | POA: Diagnosis not present

## 2017-02-12 DIAGNOSIS — I2510 Atherosclerotic heart disease of native coronary artery without angina pectoris: Secondary | ICD-10-CM | POA: Diagnosis not present

## 2017-02-12 LAB — POCT INR: INR: 3.7

## 2017-02-12 NOTE — Patient Outreach (Signed)
[facility]) Care Management  02/12/2017  Chantae Soo Moncure 1941/05/25 [id number]  CSW contacted patient who reports being released from the hospital with plans to go back on this Friday for another back surgery/procedure, "If my INR is where they want it". She reports family is checking in on her and also has HH RN, PT, etc coming in to her home.   CSW will update [facility] RNCM of above and will plan f/u call one week post procedure.   Reece Levy, MSW, LCSW Clinical Social Worker  [facility] [employer] [phone]

## 2017-02-13 ENCOUNTER — Ambulatory Visit (INDEPENDENT_AMBULATORY_CARE_PROVIDER_SITE_OTHER): Payer: Medicare HMO | Admitting: Interventional Cardiology

## 2017-02-13 DIAGNOSIS — M4854XD Collapsed vertebra, not elsewhere classified, thoracic region, subsequent encounter for fracture with routine healing: Secondary | ICD-10-CM | POA: Diagnosis not present

## 2017-02-13 DIAGNOSIS — I5022 Chronic systolic (congestive) heart failure: Secondary | ICD-10-CM | POA: Diagnosis not present

## 2017-02-13 DIAGNOSIS — I25799 Atherosclerosis of other coronary artery bypass graft(s) with unspecified angina pectoris: Secondary | ICD-10-CM | POA: Diagnosis not present

## 2017-02-13 DIAGNOSIS — L89322 Pressure ulcer of left buttock, stage 2: Secondary | ICD-10-CM | POA: Diagnosis not present

## 2017-02-13 DIAGNOSIS — M109 Gout, unspecified: Secondary | ICD-10-CM | POA: Diagnosis not present

## 2017-02-13 DIAGNOSIS — I110 Hypertensive heart disease with heart failure: Secondary | ICD-10-CM | POA: Diagnosis not present

## 2017-02-13 DIAGNOSIS — M1990 Unspecified osteoarthritis, unspecified site: Secondary | ICD-10-CM | POA: Diagnosis not present

## 2017-02-13 DIAGNOSIS — Z5181 Encounter for therapeutic drug level monitoring: Secondary | ICD-10-CM | POA: Diagnosis not present

## 2017-02-13 DIAGNOSIS — I2692 Saddle embolus of pulmonary artery without acute cor pulmonale: Secondary | ICD-10-CM | POA: Diagnosis not present

## 2017-02-13 DIAGNOSIS — E1140 Type 2 diabetes mellitus with diabetic neuropathy, unspecified: Secondary | ICD-10-CM | POA: Diagnosis not present

## 2017-02-13 DIAGNOSIS — I2510 Atherosclerotic heart disease of native coronary artery without angina pectoris: Secondary | ICD-10-CM | POA: Diagnosis not present

## 2017-02-13 DIAGNOSIS — L89312 Pressure ulcer of right buttock, stage 2: Secondary | ICD-10-CM | POA: Diagnosis not present

## 2017-02-13 NOTE — Progress Notes (Signed)
Called Dr Rosita Kea office, spoke with [facility] advised pt's INR on 02/12/17 was 3.7.  Pt has been holding Coumadin since 02/11/17.  Kyphoplasty scheduled for 02/15/17, pt was instructed to hold Coumadin x 5 days prior to procedure.  Per telephone note in Epic, Margaretmary Dys PharmD states pt needs Lovenox bridging while holding Coumadin.  Left message for Dr Rosita Kea TCB and advise if surgery needs to be rescheduled secondary to INR still being elevated.  Will await call back from his office.    Called spoke with pt, she states she has not had any Coumadin since 02/11/17 in anticipation for upcoming surgery, advised pt to continue holding INR 3.7 on 02/12/17, WCB once we hear from Dr Rosita Kea office to advise when surgery will be and what we need to do as far as Lovenox bridging.  Pt verbalized understanding.    Lillia Abed from Dr Rosita Kea office called back states they will have to cancel surgery on Friday 02/15/17, will reschedule for Monday 02/18/17.  Will call pt and coordinate Lovenox bridging instructions.

## 2017-02-14 ENCOUNTER — Other Ambulatory Visit: Payer: Self-pay | Admitting: *Deleted

## 2017-02-14 ENCOUNTER — Ambulatory Visit (INDEPENDENT_AMBULATORY_CARE_PROVIDER_SITE_OTHER): Payer: Medicare HMO | Admitting: Internal Medicine

## 2017-02-14 DIAGNOSIS — L89322 Pressure ulcer of left buttock, stage 2: Secondary | ICD-10-CM | POA: Diagnosis not present

## 2017-02-14 DIAGNOSIS — M1990 Unspecified osteoarthritis, unspecified site: Secondary | ICD-10-CM | POA: Diagnosis not present

## 2017-02-14 DIAGNOSIS — I5022 Chronic systolic (congestive) heart failure: Secondary | ICD-10-CM | POA: Diagnosis not present

## 2017-02-14 DIAGNOSIS — M4854XD Collapsed vertebra, not elsewhere classified, thoracic region, subsequent encounter for fracture with routine healing: Secondary | ICD-10-CM | POA: Diagnosis not present

## 2017-02-14 DIAGNOSIS — I2692 Saddle embolus of pulmonary artery without acute cor pulmonale: Secondary | ICD-10-CM

## 2017-02-14 DIAGNOSIS — I2510 Atherosclerotic heart disease of native coronary artery without angina pectoris: Secondary | ICD-10-CM | POA: Diagnosis not present

## 2017-02-14 DIAGNOSIS — Z5181 Encounter for therapeutic drug level monitoring: Secondary | ICD-10-CM | POA: Diagnosis not present

## 2017-02-14 DIAGNOSIS — E1140 Type 2 diabetes mellitus with diabetic neuropathy, unspecified: Secondary | ICD-10-CM | POA: Diagnosis not present

## 2017-02-14 DIAGNOSIS — L89312 Pressure ulcer of right buttock, stage 2: Secondary | ICD-10-CM | POA: Diagnosis not present

## 2017-02-14 DIAGNOSIS — M109 Gout, unspecified: Secondary | ICD-10-CM | POA: Diagnosis not present

## 2017-02-14 DIAGNOSIS — I110 Hypertensive heart disease with heart failure: Secondary | ICD-10-CM | POA: Diagnosis not present

## 2017-02-14 LAB — POCT INR: INR: 3.5

## 2017-02-14 NOTE — Patient Outreach (Signed)
[facility]) Care Management  02/14/2017  [name] 01-07-1941 [id number]   Follow up telephone call   Spoke with patient she discussed her pending back surgery on Monday, she reports is tolerating pain managing it with prn tylenol liquid.   Patient discussed  understanding of not taking coumadin in preparation for surgery and recheck of INR on tomorrow and her sister will take her in for appointment.  Patient reports no increased in shortness of breath or cough reports using her nebulizer treatments as needed. Patient discussed tolerating diet, and drinking nutrition supplement between meals.  She discussed home health RN visiting to change dressing to sacral wound. Patient discussed family has assisted with making sure she has supplies in place in case of storm, are will be available to help as needed, she does have emergency oxygen tank and all of her medication supplies.    Plan Will continue to follow patient for transition of care and progress related to surgery and plan next call within a week   Egbert Garibaldi, RN, [facility] Care Management,Care Management Coordinator  [phone]- Mobile [phone]- Toll Free Main Office .

## 2017-02-15 ENCOUNTER — Ambulatory Visit (INDEPENDENT_AMBULATORY_CARE_PROVIDER_SITE_OTHER): Payer: Medicare HMO

## 2017-02-15 ENCOUNTER — Other Ambulatory Visit: Payer: Self-pay | Admitting: *Deleted

## 2017-02-15 DIAGNOSIS — Z5181 Encounter for therapeutic drug level monitoring: Secondary | ICD-10-CM | POA: Diagnosis not present

## 2017-02-15 DIAGNOSIS — I2692 Saddle embolus of pulmonary artery without acute cor pulmonale: Secondary | ICD-10-CM

## 2017-02-15 LAB — POCT INR: INR: 2.8

## 2017-02-15 MED ORDER — ENOXAPARIN SODIUM 100 MG/ML ~~LOC~~ SOLN: 100.0000 mg | SUBCUTANEOUS | 0 refills | Status: DC

## 2017-02-15 MED ORDER — PHYTONADIONE 5 MG PO TABS: 5.0000 mg | ORAL_TABLET | Freq: Once | ORAL | 0 refills | Status: AC

## 2017-02-15 NOTE — Patient Instructions (Signed)
02/15/14: No Coumadin or Lovenox.  02/16/17: Inject Lovenox 100mg  in the fatty abdominal tissue at least 2 inches from the belly button once a day about 24 hours apart, 8am. No Coumadin.  02/17/17: Inject Lovenox in the fatty tissue every 24 hours, 8am. No Coumadin.  02/18/17: Procedure Day - No Lovenox - Resume Coumadin in the evening or as directed by doctor (take an extra half tablet with usual dose for 2 days then resume normal dose).  02/19/17: Resume Lovenox inject in the fatty tissue every 24 hours at 8am and take Coumadin.  02/20/17: Inject Lovenox in the fatty tissue every 24 hours at 8am and take Coumadin.  02/21/17: Inject Lovenox in the fatty tissue every 24 hours at 8am and take Coumadin.  02/22/17: Inject Lovenox in the fatty tissue every 24 hours at 8am and take Coumadin, Coumadin appt to check INR.

## 2017-02-18 ENCOUNTER — Ambulatory Visit: Payer: Medicare HMO | Admitting: Registered Nurse

## 2017-02-18 ENCOUNTER — Ambulatory Visit
Admission: RE | Admit: 2017-02-18 | Discharge: 2017-02-18 | Disposition: A | Discharge status: Home / Self Care | Payer: Medicare HMO | Source: Ambulatory Visit | Attending: Orthopedic Surgery | Admitting: Orthopedic Surgery

## 2017-02-18 ENCOUNTER — Ambulatory Visit: Payer: Medicare HMO

## 2017-02-18 ENCOUNTER — Encounter
Admission: RE | Disposition: A | Discharge status: Home / Self Care | Payer: Self-pay | Source: Ambulatory Visit | Attending: Orthopedic Surgery

## 2017-02-18 DIAGNOSIS — K449 Diaphragmatic hernia without obstruction or gangrene: Secondary | ICD-10-CM | POA: Diagnosis not present

## 2017-02-18 DIAGNOSIS — Z79899 Other long term (current) drug therapy: Secondary | ICD-10-CM | POA: Insufficient documentation

## 2017-02-18 DIAGNOSIS — J42 Unspecified chronic bronchitis: Secondary | ICD-10-CM | POA: Diagnosis not present

## 2017-02-18 DIAGNOSIS — Z880 Allergy status to penicillin: Secondary | ICD-10-CM | POA: Insufficient documentation

## 2017-02-18 DIAGNOSIS — Z951 Presence of aortocoronary bypass graft: Secondary | ICD-10-CM | POA: Diagnosis not present

## 2017-02-18 DIAGNOSIS — K219 Gastro-esophageal reflux disease without esophagitis: Secondary | ICD-10-CM | POA: Diagnosis not present

## 2017-02-18 DIAGNOSIS — M069 Rheumatoid arthritis, unspecified: Secondary | ICD-10-CM | POA: Diagnosis not present

## 2017-02-18 DIAGNOSIS — G8929 Other chronic pain: Secondary | ICD-10-CM | POA: Diagnosis not present

## 2017-02-18 DIAGNOSIS — D649 Anemia, unspecified: Secondary | ICD-10-CM | POA: Insufficient documentation

## 2017-02-18 DIAGNOSIS — I252 Old myocardial infarction: Secondary | ICD-10-CM | POA: Insufficient documentation

## 2017-02-18 DIAGNOSIS — M1990 Unspecified osteoarthritis, unspecified site: Secondary | ICD-10-CM | POA: Diagnosis not present

## 2017-02-18 DIAGNOSIS — Q893 Situs inversus: Secondary | ICD-10-CM | POA: Diagnosis not present

## 2017-02-18 DIAGNOSIS — J449 Chronic obstructive pulmonary disease, unspecified: Secondary | ICD-10-CM | POA: Diagnosis not present

## 2017-02-18 DIAGNOSIS — I739 Peripheral vascular disease, unspecified: Secondary | ICD-10-CM | POA: Insufficient documentation

## 2017-02-18 DIAGNOSIS — E785 Hyperlipidemia, unspecified: Secondary | ICD-10-CM | POA: Diagnosis not present

## 2017-02-18 DIAGNOSIS — N183 Chronic kidney disease, stage 3 (moderate): Secondary | ICD-10-CM | POA: Diagnosis not present

## 2017-02-18 DIAGNOSIS — M4856XA Collapsed vertebra, not elsewhere classified, lumbar region, initial encounter for fracture: Secondary | ICD-10-CM | POA: Diagnosis not present

## 2017-02-18 DIAGNOSIS — I110 Hypertensive heart disease with heart failure: Secondary | ICD-10-CM | POA: Diagnosis not present

## 2017-02-18 DIAGNOSIS — I5022 Chronic systolic (congestive) heart failure: Secondary | ICD-10-CM | POA: Insufficient documentation

## 2017-02-18 DIAGNOSIS — R0600 Dyspnea, unspecified: Secondary | ICD-10-CM | POA: Insufficient documentation

## 2017-02-18 DIAGNOSIS — I471 Supraventricular tachycardia: Secondary | ICD-10-CM | POA: Insufficient documentation

## 2017-02-18 DIAGNOSIS — Z7984 Long term (current) use of oral hypoglycemic drugs: Secondary | ICD-10-CM | POA: Insufficient documentation

## 2017-02-18 DIAGNOSIS — Z7982 Long term (current) use of aspirin: Secondary | ICD-10-CM | POA: Insufficient documentation

## 2017-02-18 DIAGNOSIS — E1122 Type 2 diabetes mellitus with diabetic chronic kidney disease: Secondary | ICD-10-CM | POA: Insufficient documentation

## 2017-02-18 DIAGNOSIS — Z8249 Family history of ischemic heart disease and other diseases of the circulatory system: Secondary | ICD-10-CM | POA: Insufficient documentation

## 2017-02-18 DIAGNOSIS — M8448XA Pathological fracture, other site, initial encounter for fracture: Secondary | ICD-10-CM | POA: Diagnosis not present

## 2017-02-18 DIAGNOSIS — I2510 Atherosclerotic heart disease of native coronary artery without angina pectoris: Secondary | ICD-10-CM | POA: Insufficient documentation

## 2017-02-18 DIAGNOSIS — Z7901 Long term (current) use of anticoagulants: Secondary | ICD-10-CM | POA: Insufficient documentation

## 2017-02-18 DIAGNOSIS — Z888 Allergy status to other drugs, medicaments and biological substances status: Secondary | ICD-10-CM | POA: Insufficient documentation

## 2017-02-18 DIAGNOSIS — G249 Dystonia, unspecified: Secondary | ICD-10-CM | POA: Insufficient documentation

## 2017-02-18 DIAGNOSIS — Z91013 Allergy to seafood: Secondary | ICD-10-CM | POA: Insufficient documentation

## 2017-02-18 DIAGNOSIS — Z885 Allergy status to narcotic agent status: Secondary | ICD-10-CM | POA: Insufficient documentation

## 2017-02-18 DIAGNOSIS — Z86718 Personal history of other venous thrombosis and embolism: Secondary | ICD-10-CM | POA: Insufficient documentation

## 2017-02-18 DIAGNOSIS — Z90710 Acquired absence of both cervix and uterus: Secondary | ICD-10-CM | POA: Diagnosis not present

## 2017-02-18 DIAGNOSIS — I509 Heart failure, unspecified: Secondary | ICD-10-CM | POA: Diagnosis not present

## 2017-02-18 DIAGNOSIS — Z86711 Personal history of pulmonary embolism: Secondary | ICD-10-CM | POA: Diagnosis not present

## 2017-02-18 DIAGNOSIS — I130 Hypertensive heart and chronic kidney disease with heart failure and stage 1 through stage 4 chronic kidney disease, or unspecified chronic kidney disease: Secondary | ICD-10-CM | POA: Insufficient documentation

## 2017-02-18 DIAGNOSIS — T148XXA Other injury of unspecified body region, initial encounter: Secondary | ICD-10-CM

## 2017-02-18 DIAGNOSIS — E119 Type 2 diabetes mellitus without complications: Secondary | ICD-10-CM | POA: Diagnosis not present

## 2017-02-18 DIAGNOSIS — S32010A Wedge compression fracture of first lumbar vertebra, initial encounter for closed fracture: Secondary | ICD-10-CM | POA: Diagnosis not present

## 2017-02-18 HISTORY — PX: KYPHOPLASTY: SHX5884

## 2017-02-18 LAB — PROTIME-INR
INR: 1.15
PROTHROMBIN TIME: 14.6 s (ref 11.4–15.2)

## 2017-02-18 LAB — GLUCOSE, CAPILLARY: Glucose-Capillary: 109 mg/dL — ABNORMAL HIGH (ref 65–99)

## 2017-02-18 SURGERY — KYPHOPLASTY
Anesthesia: General | Site: Spine Lumbar | Wound class: Clean

## 2017-02-18 MED ORDER — SODIUM CHLORIDE 0.9 % IV SOLN
INTRAVENOUS | Status: DC
  Administered 2017-02-18: 10:00:00 via INTRAVENOUS

## 2017-02-18 MED ORDER — FENTANYL CITRATE (PF) 100 MCG/2ML IJ SOLN
25.0000 ug | INTRAMUSCULAR | Status: DC | PRN
  Administered 2017-02-18 (×5): 25 ug via INTRAVENOUS

## 2017-02-18 MED ORDER — LIDOCAINE HCL (PF) 1 % IJ SOLN
INTRAMUSCULAR | Status: AC
  Filled 2017-02-18: qty 60

## 2017-02-18 MED ORDER — PROPOFOL 10 MG/ML IV BOLUS
INTRAVENOUS | Status: AC
  Filled 2017-02-18: qty 40

## 2017-02-18 MED ORDER — FENTANYL CITRATE (PF) 100 MCG/2ML IJ SOLN
INTRAMUSCULAR | Status: AC
  Administered 2017-02-18: 25 ug via INTRAVENOUS
  Filled 2017-02-18: qty 2

## 2017-02-18 MED ORDER — PROPOFOL 10 MG/ML IV BOLUS
INTRAVENOUS | Status: DC | PRN
  Administered 2017-02-18 (×2): 10 mg via INTRAVENOUS
  Administered 2017-02-18: 30 mg via INTRAVENOUS

## 2017-02-18 MED ORDER — PROPOFOL 10 MG/ML IV BOLUS
INTRAVENOUS | Status: AC
  Filled 2017-02-18: qty 20

## 2017-02-18 MED ORDER — PROPOFOL 500 MG/50ML IV EMUL
INTRAVENOUS | Status: DC | PRN
  Administered 2017-02-18: 30 ug/kg/min via INTRAVENOUS

## 2017-02-18 MED ORDER — BUPIVACAINE-EPINEPHRINE (PF) 0.5% -1:200000 IJ SOLN
INTRAMUSCULAR | Status: DC | PRN
  Administered 2017-02-18: 20 mL via PERINEURAL

## 2017-02-18 MED ORDER — ONDANSETRON HCL 4 MG/2ML IJ SOLN: 4.0000 mg | Freq: Once | INTRAMUSCULAR | Status: DC | PRN

## 2017-02-18 MED ORDER — MIDAZOLAM HCL 2 MG/2ML IJ SOLN
INTRAMUSCULAR | Status: AC
  Filled 2017-02-18: qty 2

## 2017-02-18 MED ORDER — FENTANYL CITRATE (PF) 100 MCG/2ML IJ SOLN
INTRAMUSCULAR | Status: DC | PRN
  Administered 2017-02-18 (×2): 25 ug via INTRAVENOUS

## 2017-02-18 MED ORDER — CLINDAMYCIN PHOSPHATE 900 MG/50ML IV SOLN
INTRAVENOUS | Status: AC
  Filled 2017-02-18: qty 50

## 2017-02-18 MED ORDER — CLINDAMYCIN PHOSPHATE 900 MG/50ML IV SOLN
900.0000 mg | Freq: Once | INTRAVENOUS | Status: AC
  Administered 2017-02-18: 900 mg via INTRAVENOUS

## 2017-02-18 MED ORDER — FENTANYL CITRATE (PF) 100 MCG/2ML IJ SOLN
INTRAMUSCULAR | Status: AC
  Filled 2017-02-18: qty 2

## 2017-02-18 MED ORDER — IOPAMIDOL (ISOVUE-M 200) INJECTION 41%
INTRAMUSCULAR | Status: AC
  Filled 2017-02-18: qty 20

## 2017-02-18 MED ORDER — LIDOCAINE HCL 1 % IJ SOLN
INTRAMUSCULAR | Status: DC | PRN
  Administered 2017-02-18: 20 mL
  Administered 2017-02-18: 10 mL

## 2017-02-18 MED ORDER — MIDAZOLAM HCL 2 MG/2ML IJ SOLN
INTRAMUSCULAR | Status: DC | PRN
  Administered 2017-02-18: 1 mg via INTRAVENOUS

## 2017-02-18 MED ORDER — BUPIVACAINE-EPINEPHRINE (PF) 0.5% -1:200000 IJ SOLN
INTRAMUSCULAR | Status: AC
  Filled 2017-02-18: qty 30

## 2017-02-18 MED ORDER — IOPAMIDOL (ISOVUE-M 200) INJECTION 41%
INTRAMUSCULAR | Status: DC | PRN
  Administered 2017-02-18: 20 mL

## 2017-02-18 SURGICAL SUPPLY — 17 items
ADH SKN CLS APL DERMABOND .7 (GAUZE/BANDAGES/DRESSINGS) ×1
CEMENT KYPHON CX01A KIT/MIXER (Cement) ×3 IMPLANT
DERMABOND ADVANCED (GAUZE/BANDAGES/DRESSINGS) ×2
DERMABOND ADVANCED .7 DNX12 (GAUZE/BANDAGES/DRESSINGS) ×1 IMPLANT
DEVICE BIOPSY BONE KYPHX (INSTRUMENTS) ×3 IMPLANT
DRAPE C-ARM XRAY 36X54 (DRAPES) ×3 IMPLANT
DURAPREP 26ML APPLICATOR (WOUND CARE) ×3 IMPLANT
GLOVE SURG SYN 9.0  PF PI (GLOVE) ×2
GLOVE SURG SYN 9.0 PF PI (GLOVE) ×1 IMPLANT
GOWN SRG 2XL LVL 4 RGLN SLV (GOWNS) ×1 IMPLANT
GOWN STRL NON-REIN 2XL LVL4 (GOWNS) ×3
GOWN STRL REUS W/ TWL LRG LVL3 (GOWN DISPOSABLE) ×1 IMPLANT
GOWN STRL REUS W/TWL LRG LVL3 (GOWN DISPOSABLE) ×3
PACK KYPHOPLASTY (MISCELLANEOUS) ×3 IMPLANT
STRAP SAFETY BODY (MISCELLANEOUS) ×3 IMPLANT
TRAY KYPHOPAK 15/3 EXPRESS 1ST (MISCELLANEOUS) IMPLANT
TRAY KYPHOPAK 20/3 EXPRESS 1ST (MISCELLANEOUS) ×3 IMPLANT

## 2017-02-18 NOTE — Transfer of Care (Signed)
Immediate Anesthesia Transfer of Care Note  Patient: [name]  Procedure(s) Performed: Procedure(s): KYPHOPLASTY-L1 (N/A)  Patient Location: PACU  Anesthesia Type:General  Level of Consciousness: awake, alert  and oriented  Airway & Oxygen Therapy: Patient Spontanous Breathing and Patient connected to nasal cannula oxygen  Post-op Assessment: Report given to RN and Post -op Vital signs reviewed and stable  Post vital signs: Reviewed and stable  Last Vitals:  Vitals:   02/18/17 0954 02/18/17 1247  BP: 128/71 95/72  Pulse: (!) 109 (!) 122  Resp: (!) 24 13  Temp: 36.5 C 36.5 C  SpO2: 94% 100%    Last Pain:  Vitals:   02/18/17 1247  TempSrc: Temporal  PainSc:       Patients Stated Pain Goal: 2 (02/18/17 0954)  Complications: No apparent anesthesia complications

## 2017-02-18 NOTE — H&P (Signed)
Reviewed paper H+P, will be scanned into chart. No changes noted.  

## 2017-02-18 NOTE — Progress Notes (Signed)
Assisted to bedside commode to void

## 2017-02-18 NOTE — Progress Notes (Signed)
Oxygen at 2 liters per pts own for discharge

## 2017-02-18 NOTE — Discharge Instructions (Addendum)
Take it easy today, resume normal activities tomorrow. Remove band aid on Wednesday, ok to shower after that    AMBULATORY SURGERY  DISCHARGE INSTRUCTIONS   1) The drugs that you were given will stay in your system until tomorrow so for the next 24 hours you should not:  A) Drive an automobile B) Make any legal decisions C) Drink any alcoholic beverage   2) You may resume regular meals tomorrow.  Today it is better to start with liquids and gradually work up to solid foods.  You may eat anything you prefer, but it is better to start with liquids, then soup and crackers, and gradually work up to solid foods.   3) Please notify your doctor immediately if you have any unusual bleeding, trouble breathing, redness and pain at the surgery site, drainage, fever, or pain not relieved by medication.    4) Additional Instructions:        Please contact your physician with any problems or Same Day Surgery at [phone], Monday through Friday 6 am to 4 pm, or [place] at [facility] number at [phone].

## 2017-02-18 NOTE — Anesthesia Preprocedure Evaluation (Signed)
Anesthesia Evaluation  Patient identified by MRN, date of birth, ID band Patient awake    Reviewed: Allergy & Precautions, NPO status , Patient's Chart, lab work & pertinent test results  History of Anesthesia Complications Negative for: history of anesthetic complications  Airway Mallampati: II  TM Distance: >3 FB Neck ROM: Full    Dental  (+) Upper Dentures, Lower Dentures   Pulmonary shortness of breath and Long-Term Oxygen Therapy, neg sleep apnea, COPD,  oxygen dependent,    breath sounds clear to auscultation- rhonchi (-) wheezing      Cardiovascular hypertension, Pt. on medications + CAD, + Past MI, + CABG (2013), + Peripheral Vascular Disease and +CHF (preserved EF)   Rhythm:Regular Rate:Normal - Systolic murmurs and - Diastolic murmurs Echo 08/28/16: - Left ventricle: The cavity size was normal. Wall thickness was   normal. Systolic function was normal. The estimated ejection   fraction was in the range of 55% to 60%. Although no diagnostic   regional wall motion abnormality was identified, this possibility   cannot be completely excluded on the basis of this study. Doppler   parameters are consistent with abnormal left ventricular   relaxation (grade 1 diastolic dysfunction). - Aortic valve: There was no stenosis. - Mitral valve: Mildly calcified annulus. There was no significant   regurgitation. - Right ventricle: Poorly visualized. Probably normal size and   systolic function. - Pulmonary arteries: No complete TR doppler jet so unable to   estimate PA systolic pressure. - Systemic veins: IVC not visualized.   Neuro/Psych negative neurological ROS  negative psych ROS   GI/Hepatic Neg liver ROS, hiatal hernia, GERD  ,  Endo/Other  diabetes, Oral Hypoglycemic Agents  Renal/GU Renal InsufficiencyRenal disease     Musculoskeletal  (+) Arthritis ,   Abdominal (+) - obese,   Peds  Hematology  (+) anemia ,    Anesthesia Other Findings Past Medical History: No date: Anemia No date: Arthritis No date: CAD (coronary artery disease)     Comment:  NSTEMI in setting of gallstone pancreatitis 05/2011:                LHC with 3v CAD; CABG was performed 06/14/11: RIMA-LAD,               SVG-ramus, SVG-RCA No date: Carotid stenosis     Comment:  Dopplers 06/12/11: LICA 60-79%. No date: CHF (congestive heart failure) (HCC) No date: Chronic bronchitis No date: Chronic systolic heart failure (HCC) No date: Collagen vascular disease (HCC) No date: Dextrocardia ~2006: DVT of leg (deep venous thrombosis) (HCC)     Comment:  left 05/24/2011: Fracture     Comment:  right; "did not have surgery" No date: GERD (gastroesophageal reflux disease) No date: H/O hiatal hernia No date: HLD (hyperlipidemia) No date: Hypertension No date: Ischemic cardiomyopathy     Comment:  Echocardiogram 06/05/11: EF 40-45%, anteroseptal               hypokinesis, apical hypokinesis, mild LAE No date: Neuropathy 06/13/11: Pneumonia     Comment:  "couple times; long time ago" March 2013: Pulmonary embolus (HCC) No date: Rheumatoid arthritis (HCC) No date: Situs inversus with dextrocardia     Comment:  CT 06/2011: situs inversus totalis.  What would typically              be called RCA arose from anterior sinus of Valsalva.                What would  typically be called the left main arises from               the posterior sinus of Valsalva and gives rise to a large              first diagonal branch and diminutive circumflex   Reproductive/Obstetrics                             Anesthesia Physical  Anesthesia Plan  ASA: IV  Anesthesia Plan: General   Post-op Pain Management:    Induction: Intravenous  PONV Risk Score and Plan: 2 and Ondansetron and Propofol infusion  Airway Management Planned: Natural Airway  Additional Equipment:   Intra-op Plan:   Post-operative Plan:   Informed  Consent: I have reviewed the patients History and Physical, chart, labs and discussed the procedure including the risks, benefits and alternatives for the proposed anesthesia with the patient or authorized representative who has indicated his/her understanding and acceptance.   Dental advisory given  Plan Discussed with: CRNA and Anesthesiologist  Anesthesia Plan Comments:         Anesthesia Quick Evaluation

## 2017-02-18 NOTE — Op Note (Signed)
02/18/2017  12:47 PM  PATIENT:  [name]  76 y.o. female  PRE-OPERATIVE DIAGNOSIS:  WEDGE COMPRESSION 1ST LUMBAR VERTEBRA  POST-OPERATIVE DIAGNOSIS:  compression fracture L1  PROCEDURE:  Procedure(s): KYPHOPLASTY-L1 (N/A)  SURGEON: Laurene Footman, MD  ASSISTANTS: none ANESTHESIA:   local and MAC  EBL:  Total I/O In: 200 [I.V.:200] Out: -   BLOOD ADMINISTERED:none  DRAINS: none   LOCAL MEDICATIONS USED:  MARCAINE    and XYLOCAINE   SPECIMEN:  No Specimen  DISPOSITION OF SPECIMEN:  N/A  COUNTS:  YES  TOURNIQUET:  * No tourniquets in log *  IMPLANTS: bone cement   DICTATION: .Dragon Dictation   patient brought the operating room and after adequate sedation was given the patient was placed prone and C-arm brought in and with goodvisualization ofT12.After patient identification and timeout procedure completed forcc 1% Xylocaine was infiltrated on the right side at [facility] of better visualization of the pedicle on that side.Marland KitchenNext the back was prepped and draped in sterile manner and repeat timeout procedure carried out. Spinal needle was used to get local anesthetic down to the pedicle on the right side with a total of 15 cc half percent Sensorcaine with epinephrine and 15 cc Xylocaine a small incision was then made and a trocar advanced and extrapedicular fashion into the L1 body staying lateral to the medial wall the pedicle set was advanced through the pedicle with biplanar imaging used biopsy was then attmepted  balloon was inflated to approximately 4cc .The cement was then mixed and inserted when it was the appropriate consistency with 5cc of bone cement filling the L1vertebral body getting very good interdigitation and coverage from superior to inferior medial right to left sidesl without extravasation.When the cement was set the trochar removed and permanent C-arm views obtained. Dermabond were used to close the skin followed by a Band-Aid  PLAN OF CARE:  Discharge to home after PACU  PATIENT DISPOSITION:  PACU - hemodynamically stable.

## 2017-02-18 NOTE — Anesthesia Post-op Follow-up Note (Signed)
Anesthesia QCDR form completed.        

## 2017-02-18 NOTE — Anesthesia Postprocedure Evaluation (Signed)
Anesthesia Post Note  Patient: [name]  Procedure(s) Performed: Procedure(s) (LRB): KYPHOPLASTY-L1 (N/A)  Patient location during evaluation: PACU Anesthesia Type: General Level of consciousness: awake and alert Pain management: pain level controlled Vital Signs Assessment: post-procedure vital signs reviewed and stable Respiratory status: spontaneous breathing, nonlabored ventilation, respiratory function stable and patient connected to nasal cannula oxygen Cardiovascular status: blood pressure returned to baseline and stable Postop Assessment: no apparent nausea or vomiting Anesthetic complications: no     Last Vitals:  Vitals:   02/18/17 1347 02/18/17 1400  BP:    Pulse:  (!) 123  Resp: 20 20  Temp:    SpO2: 94% 93%    Last Pain:  Vitals:   02/18/17 1338  TempSrc: Temporal  PainSc: 5                  Lenard Simmer

## 2017-02-18 NOTE — Progress Notes (Signed)
bandaid dry and intact to back

## 2017-02-18 NOTE — H&P (Signed)
Subjective:   Patient is a 76 y.o. female presents with L1 compression fracture. Onset of symptoms was abrupt starting 4 weeks ago with gradually worsening course since that time. The pain is located mid back. Patient describes the pain as sharp continuous and rated as moderate and severe.  Symptoms are aggravated by activity. Symptoms improve with lying flat. Past history includes prior T12 kyphoplasty.  Previous studies include xray.  Patient Active Problem List   Diagnosis Date Noted  . Chronic bronchiectasis not affecting current episode of care (HCC) 02/08/2017  . SVT (supraventricular tachycardia) (HCC) 02/08/2017  . 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 Intractable pain 02/08/2017  . Closed compression fracture of L1 lumbar vertebra (HCC)   . Rheumatoid arthritis (HCC) 02/06/2017  . Decubital ulcer 02/06/2017  . CKD (chronic kidney disease) stage 3, GFR 30-59 ml/min 02/06/2017  . CAP (community acquired pneumonia) 05/25/2015  . Encounter for therapeutic drug monitoring 08/05/2013  . 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 CAD (coronary artery disease) 09/11/2011  . 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 Acute respiratory failure with hypoxia (HCC) 08/12/2011  . Saddle embolism of pulmonary artery (HCC) 08/12/2011  . Pericardial effusion 08/12/2011  . Atrial mass 08/12/2011  . Dyspnea 08/07/2011  . CAD 07/10/2011  . Cough 07/10/2011  . DM2 (diabetes mellitus, type 2) (HCC) 07/10/2011  . HLD (hyperlipidemia) 07/10/2011  . Carotid stenosis   . Hypokalemia 06/09/2011  . Hypomagnesemia 06/09/2011  . Dextrocardia 06/09/2011  . Acute myocardial infarction, subendocardial infarction, subsequent episode of care (HCC) 06/08/2011  . Chronic systolic heart failure (HCC) 06/08/2011  . Acute renal failure (HCC) 06/08/2011   Past Medical History:  Diagnosis Date  . Allergy   . Anemia   . Arthritis   . Blood transfusion without reported diagnosis   . CAD (coronary artery  disease)    NSTEMI in setting of gallstone pancreatitis 05/2011:  LHC with 3v CAD; CABG was performed 06/14/11: RIMA-LAD, SVG-ramus, SVG-RCA  . Carotid stenosis    Dopplers 06/12/11: LICA 60-79%.  . Cataract   . CHF (congestive heart failure) (HCC)   . Chronic back pain   . Chronic bronchitis   . Chronic kidney disease   . Chronic systolic heart failure (HCC)   . Collagen vascular disease (HCC)   . Dextrocardia   . Diabetes mellitus without complication (HCC)   . DVT of leg (deep venous thrombosis) (HCC) ~2006   left  . Fracture 05/24/2011   right; "did not have surgery"  . GERD (gastroesophageal reflux disease)   . H/O hiatal hernia   . HLD (hyperlipidemia)   . Hypertension   . Ischemic cardiomyopathy    Echocardiogram 06/05/11: EF 40-45%, anteroseptal hypokinesis, apical hypokinesis, mild LAE  . Myocardial infarction (HCC)   . Neuromuscular disorder (HCC)   . Neuropathy   . Osteoporosis   . Oxygen deficiency    2l  . Pneumonia 06/13/11   "couple times; long time ago"  . Pulmonary embolus [facility]) March 2013  . Rheumatoid arthritis (HCC)   . Situs inversus with dextrocardia    CT 06/2011: situs inversus totalis.  What would typically be called RCA arose from anterior sinus of Valsalva.  What would typically be called the left main arises from the posterior sinus of Valsalva and gives rise to a large first diagonal branch and diminutive circumflex    Past Surgical History:  Procedure Laterality Date  . ARCH AORTOGRAM Right 06/11/2011   Procedure: ARCH AORTOGRAM;  Surgeon: Kathleene Hazel,  MD;  Location: [facility] CATH LAB;  Service: Cardiovascular;  Laterality: Right;  . CARDIAC CATHETERIZATION  06/11/11  . CHOLECYSTECTOMY    . CORONARY ARTERY BYPASS GRAFT  06/14/2011   Procedure: CORONARY ARTERY BYPASS GRAFTING (CABG);  Surgeon: Alleen Borne, MD;  Location: [facility] OR;  Service: Open Heart Surgery;  Laterality: N/A;  . ERCP  06/03/2011   Procedure: ENDOSCOPIC RETROGRADE  CHOLANGIOPANCREATOGRAPHY (ERCP);  Surgeon: Petra Kuba, MD;  Location[facility] OR;  Service: Endoscopy;  Laterality: N/A;  . KYPHOPLASTY N/A 01/02/2017   Procedure: KYPHOPLASTY L2;  Surgeon: Kennedy Bucker, MD;  Location: [facility] ORS;  Service: Orthopedics;  Laterality: N/A;  . LEFT HEART CATHETERIZATION WITH CORONARY ANGIOGRAM N/A 06/11/2011   Procedure: LEFT HEART CATHETERIZATION WITH CORONARY ANGIOGRAM;  Surgeon: Kathleene Hazel, MD;  Location: [facility] CATH LAB;  Service: Cardiovascular;  Laterality: N/A;  . VAGINAL HYSTERECTOMY  1970's    Prescriptions Prior to Admission  Medication Sig Dispense Refill Last Dose  . acetaminophen (TYLENOL) 160 MG/5ML elixir Take 320 mg by mouth every 6 (six) hours.   02/17/2017 at Unknown time  . alendronate (FOSAMAX) 70 MG tablet Take 70 mg by mouth every Sunday.    02/17/2017  . allopurinol (ZYLOPRIM) 100 MG tablet Take 100 mg by mouth 2 (two) times daily.    02/17/2017 at Unknown time  . aspirin EC 81 MG tablet Take 81 mg by mouth every morning.    01/13/2017  . calcium carbonate (TUMS) 500 MG chewable tablet Chew 1 tablet (200 mg of elemental calcium total) by mouth daily. 90 tablet 3 02/17/2017 at Unknown time  . cholecalciferol (VITAMIN D) 1000 UNITS tablet Take 1,000 Units by mouth every morning.    02/17/2017 at Unknown time  . folic acid (FOLVITE) 800 MCG tablet Take 800 mcg by mouth every morning.    02/17/2017 at Unknown time  . furosemide (LASIX) 40 MG tablet Take 1 tablet (40 mg total) by mouth daily as needed for fluid or edema. Take only if you gain 2-3 lb of fluid in a 24 hr period (Patient taking differently: Take 40 mg by mouth every morning. ) 90 tablet 3 02/17/2017 at Unknown time  . gabapentin (NEURONTIN) 300 MG capsule Take 300 mg by mouth 2 (two) times daily.     02/17/2017 at Unknown time  . guaiFENesin (ROBITUSSIN) 100 MG/5ML SOLN Take 10 mLs (200 mg total) by mouth 3 (three) times daily. 1200 mL 0 02/17/2017 at Unknown time  . metFORMIN (GLUCOPHAGE-XR)  500 MG 24 hr tablet Take 500 mg by mouth daily as needed (low blood sugar).    02/14/2017  . methotrexate (RHEUMATREX) 2.5 MG tablet Take 20 mg by mouth every Monday.    02/11/2017  . metoprolol tartrate (LOPRESSOR) 25 MG tablet Take 25 mg by mouth 2 (two) times daily.   02/17/2017 at Unknown time  . omeprazole (PRILOSEC) 20 MG capsule Take 20 mg by mouth every morning.     02/17/2017 at Unknown time  . simvastatin (ZOCOR) 20 MG tablet Take 20 mg by mouth every evening.    02/17/2017 at Unknown time  . collagenase (SANTYL) ointment Apply topically daily. (Patient not taking: Reported on 02/18/2017) 15 g 0 Not Taking at Unknown time  . enoxaparin (LOVENOX) 100 MG/ML injection Inject 1 mL (100 mg total) into the skin daily. 10 Syringe 0   . guaiFENesin (MUCINEX) 600 MG 12 hr tablet Take 1 tablet (600 mg total) by mouth 2 (two) times daily. (Patient not taking:  Reported on 02/11/2017) 60 tablet 2 Not Taking  . HYDROmorphone (DILAUDID) 2 MG tablet Take 0.5 tablets (1 mg total) by mouth every 4 (four) hours as needed for severe pain. (Patient not taking: Reported on 02/11/2017) 30 tablet 0 Not Taking  . ipratropium-albuterol (DUONEB) 0.5-2.5 (3) MG/3ML SOLN Take 3 mLs by nebulization every 6 (six) hours as needed. (Patient not taking: Reported on 02/18/2017) 360 mL 5 Not Taking at Unknown time  . polyethylene glycol (MIRALAX / GLYCOLAX) packet Take 17 g by mouth daily as needed for mild constipation. (Patient not taking: Reported on 02/14/2017) 14 each 0 Not Taking at Unknown time  . Respiratory Therapy Supplies (FLUTTER) DEVI Use 10-15 times daily 1 each 0 02/16/2017  . sodium chloride HYPERTONIC 3 % nebulizer solution Take 4 mLs by nebulization 2 (two) times daily. 750 mL 12 02/16/2017  . warfarin (COUMADIN) 2 MG tablet Take 6 mg for 3 days. RN should come to your home to check the INR on Tuesday. (Patient not taking: Reported on 02/11/2017) 65 tablet 3 02/10/2017   Allergies  Allergen Reactions  . Penicillins Rash     Has patient had a PCN reaction causing immediate rash, facial/tongue/throat swelling, SOB or lightheadedness with hypotension: Yes Has patient had a PCN reaction causing severe rash involving mucus membranes or skin necrosis: No Has patient had a PCN reaction that required hospitalization: No Has patient had a PCN reaction occurring within the last 10 years: No If all of the above answers are "NO", then may proceed with Cephalosporin use.   . Shellfish Allergy Anaphylaxis    Rash   . Hydrocodone Other (See Comments)    Pt says "it's too high powered so it sends me out to space"  . Oxycodone Other (See Comments)    Pt says "it's too high powered so it sends me out to space"  . Levaquin [Levofloxacin] Hives    Social History  Substance Use Topics  . Smoking status: Never Smoker  . Smokeless tobacco: Never Used  . Alcohol use No    Family History  Problem Relation Age of Onset  . Heart disease Father   . Heart disease Sister     Review of Systems Pertinent items are noted in HPI.  Objective:   Patient Vitals for the past 8 hrs:  BP Temp Temp src Pulse Resp SpO2 Height Weight  02/18/17 0954 128/71 97.7 F (36.5 C) Oral (!) 109 (!) 24 94 % 5\' 2"  (1.575 m) 68 kg (150 lb)   No intake/output data recorded. No intake/output data recorded.    BP 128/71   Pulse (!) 109   Temp 97.7 F (36.5 C) (Oral)   Resp (!) 24   Ht 5\' 2"  (1.575 m)   Wt 68 kg (150 lb)   SpO2 94%   BMI 27.44 kg/m  General appearance: alert, appears older than stated age and mild distress Back: tender with kyphosis at TL junction Lungs: clear to auscultation bilaterally Heart: irregularly irregular rhythm   Data Review Coagulation:  Lab Results  Component Value Date   INR 1.15 02/18/2017   INR 1.9 05/28/2013   APTT 32 05/25/2015   APTT 28.8 05/25/2013   Radiology review: xrays show new L1 fracture  Assessment:   Active Problems:   * No active hospital problems. * L1 compression  fracture  Plan:   L1 kyphoplasty

## 2017-02-21 ENCOUNTER — Other Ambulatory Visit: Payer: Self-pay | Admitting: *Deleted

## 2017-02-21 NOTE — Patient Outreach (Signed)
[facility]) Care Management  02/21/2017  [place] [name] 1940/08/24 [id number]   Transition of care call  Spoke with patient reports she is doing fairly well, discussed recent same day  back procedure on Monday. Patient reports she is tolerating getting around apartment using her walker, denies falls. She reports some stiffness with moving.  Patient denies increase in her usual cough, reports she has used nebulizer treatment a couple of time, and states she is using flutter valve daily.  Patient discussed her appetite is back yet but she is trying, drinking at least 2 ensures a day between meals.  Patient anticipates home health services to resume on tomorrow with home health RN to check INR and change dressing to sacral area. Patient reports she has appointment with wound clinic in the next month. Discussed with patient importance of nutrition and pressure reducing to sacral area, discussed recommendations of hospital bed with air overlay mattress to help with reducing pressure to buttocks and help with healing, she states she does not want that at this time .  Patient reports her daughter in law has been giving lovenox injection each day and patient reports she is back to her usual dose of coumadin on today as per anticoag clinic instructions .  Patient denies any signs of bleeding.   Patient discussed post op visit  with Dr.Menz on 10/1, sister in law with assist with transportation   Plan Patient has completed 31 days of transition of care after discharge from rehab stay.  Will plan home visit in the next week and continued complex care management follow up.  Placed call to [facility] home health verifying home visit  For 9/21 in place and  sacral wound care order in place for visit.  Alyssa Garibaldi, RN, [facility] Care Management,Care Management Coordinator  [phone]- Mobile [phone]- Toll Free Main Office

## 2017-02-22 ENCOUNTER — Ambulatory Visit (INDEPENDENT_AMBULATORY_CARE_PROVIDER_SITE_OTHER): Payer: Medicare HMO | Admitting: Cardiology

## 2017-02-22 DIAGNOSIS — I471 Supraventricular tachycardia: Secondary | ICD-10-CM

## 2017-02-22 DIAGNOSIS — L89312 Pressure ulcer of right buttock, stage 2: Secondary | ICD-10-CM | POA: Diagnosis not present

## 2017-02-22 DIAGNOSIS — M4854XD Collapsed vertebra, not elsewhere classified, thoracic region, subsequent encounter for fracture with routine healing: Secondary | ICD-10-CM | POA: Diagnosis not present

## 2017-02-22 DIAGNOSIS — M109 Gout, unspecified: Secondary | ICD-10-CM | POA: Diagnosis not present

## 2017-02-22 DIAGNOSIS — I2510 Atherosclerotic heart disease of native coronary artery without angina pectoris: Secondary | ICD-10-CM | POA: Diagnosis not present

## 2017-02-22 DIAGNOSIS — E1140 Type 2 diabetes mellitus with diabetic neuropathy, unspecified: Secondary | ICD-10-CM | POA: Diagnosis not present

## 2017-02-22 DIAGNOSIS — I2692 Saddle embolus of pulmonary artery without acute cor pulmonale: Secondary | ICD-10-CM

## 2017-02-22 DIAGNOSIS — Z5181 Encounter for therapeutic drug level monitoring: Secondary | ICD-10-CM | POA: Diagnosis not present

## 2017-02-22 DIAGNOSIS — M1990 Unspecified osteoarthritis, unspecified site: Secondary | ICD-10-CM | POA: Diagnosis not present

## 2017-02-22 DIAGNOSIS — I110 Hypertensive heart disease with heart failure: Secondary | ICD-10-CM | POA: Diagnosis not present

## 2017-02-22 DIAGNOSIS — I5022 Chronic systolic (congestive) heart failure: Secondary | ICD-10-CM | POA: Diagnosis not present

## 2017-02-22 DIAGNOSIS — L89322 Pressure ulcer of left buttock, stage 2: Secondary | ICD-10-CM | POA: Diagnosis not present

## 2017-02-22 LAB — POCT INR: INR: 1.5

## 2017-02-22 NOTE — Progress Notes (Signed)
PT eval addendum- added G-codes    02/25/2017 1647  PT G-Codes **NOT FOR INPATIENT CLASS**  Functional Assessment Tool Used Clinical judgement  Functional Limitation Mobility: Walking and moving around  Mobility: Walking and Moving Around Current Status [phone]) CK  Mobility: Walking and Moving Around Goal Status [phone]) CJ    Mylo Red, PT, DPT [phone]

## 2017-02-25 ENCOUNTER — Ambulatory Visit: Payer: Self-pay | Admitting: *Deleted

## 2017-02-26 ENCOUNTER — Other Ambulatory Visit: Payer: Self-pay | Admitting: *Deleted

## 2017-02-26 DIAGNOSIS — I110 Hypertensive heart disease with heart failure: Secondary | ICD-10-CM | POA: Diagnosis not present

## 2017-02-26 DIAGNOSIS — M1990 Unspecified osteoarthritis, unspecified site: Secondary | ICD-10-CM | POA: Diagnosis not present

## 2017-02-26 DIAGNOSIS — I2510 Atherosclerotic heart disease of native coronary artery without angina pectoris: Secondary | ICD-10-CM | POA: Diagnosis not present

## 2017-02-26 DIAGNOSIS — E1140 Type 2 diabetes mellitus with diabetic neuropathy, unspecified: Secondary | ICD-10-CM | POA: Diagnosis not present

## 2017-02-26 DIAGNOSIS — M109 Gout, unspecified: Secondary | ICD-10-CM | POA: Diagnosis not present

## 2017-02-26 DIAGNOSIS — I5022 Chronic systolic (congestive) heart failure: Secondary | ICD-10-CM | POA: Diagnosis not present

## 2017-02-26 DIAGNOSIS — L89312 Pressure ulcer of right buttock, stage 2: Secondary | ICD-10-CM | POA: Diagnosis not present

## 2017-02-26 DIAGNOSIS — L89322 Pressure ulcer of left buttock, stage 2: Secondary | ICD-10-CM | POA: Diagnosis not present

## 2017-02-26 DIAGNOSIS — M4854XD Collapsed vertebra, not elsewhere classified, thoracic region, subsequent encounter for fracture with routine healing: Secondary | ICD-10-CM | POA: Diagnosis not present

## 2017-02-27 ENCOUNTER — Ambulatory Visit (INDEPENDENT_AMBULATORY_CARE_PROVIDER_SITE_OTHER): Payer: Medicare HMO | Admitting: Internal Medicine

## 2017-02-27 ENCOUNTER — Other Ambulatory Visit: Payer: Self-pay | Admitting: *Deleted

## 2017-02-27 DIAGNOSIS — I2692 Saddle embolus of pulmonary artery without acute cor pulmonale: Secondary | ICD-10-CM

## 2017-02-27 DIAGNOSIS — Z5181 Encounter for therapeutic drug level monitoring: Secondary | ICD-10-CM

## 2017-02-27 LAB — POCT INR: INR: 1.8

## 2017-02-27 NOTE — Patient Outreach (Addendum)
[facility]) Care Management   02/27/2017  [name] 11/28/1940 [id number]  [name] is an 76 y.o. female  Subjective:  Patient discussed she is waiting to hear back for office about INR level that was drawn yesterday, by home health RN. Patient states she has called [facility] home health RN to ask if she has received results yet, reports she will check on it.  Patient reports she is manages her medications herself without problems , keeps all medication bottles in container and takes medications from bottle daily, denies having any problems with her system  States she just wants to get her coumadin back regulated as it has been. Patient keeps a list of coumadin dosing on pad at her chairside, when called from office with changes.   Objective: BP 110/60 (BP Location: Left Arm, Patient Position: Sitting, Cuff Size: Normal)   Pulse (!) 104   Resp 20   SpO2 98%  Review of Systems  Constitutional: Positive for weight loss.  HENT: Negative.        Hard of hearing   Eyes: Negative.   Respiratory: Positive for cough. Negative for shortness of breath.   Cardiovascular: Positive for leg swelling.  Gastrointestinal: Negative.   Genitourinary: Negative.   Skin: Negative.   Neurological: Negative.   Endo/Heme/Allergies: Bruises/bleeds easily.  Psychiatric/Behavioral: Negative.     Physical Exam  Constitutional: She is oriented to person, place, and time. She appears well-developed and well-nourished.  Cardiovascular: Normal rate and normal heart sounds.   Respiratory: Effort normal.  GI: Soft.  Neurological: She is alert and oriented to person, place, and time.  Skin: Skin is warm and dry.  Psychiatric: She has a normal mood and affect. Her behavior is normal. Judgment and thought content normal.    Encounter Medications:   Outpatient Encounter Prescriptions as of 02/27/2017  Medication Sig Note  . acetaminophen (TYLENOL) 160 MG/5ML elixir Take 320 mg by  mouth every 6 (six) hours.   Marland Kitchen alendronate (FOSAMAX) 70 MG tablet Take 70 mg by mouth every Sunday.    Marland Kitchen allopurinol (ZYLOPRIM) 100 MG tablet Take 100 mg by mouth 2 (two) times daily.    Marland Kitchen aspirin EC 81 MG tablet Take 81 mg by mouth every morning.    . calcium carbonate (TUMS) 500 MG chewable tablet Chew 1 tablet (200 mg of elemental calcium total) by mouth daily.   . cholecalciferol (VITAMIN D) 1000 UNITS tablet Take 1,000 Units by mouth every morning.    . folic acid (FOLVITE) 800 MCG tablet Take 800 mcg by mouth every morning.    . furosemide (LASIX) 40 MG tablet Take 1 tablet (40 mg total) by mouth daily as needed for fluid or edema. Take only if you gain 2-3 lb of fluid in a 24 hr period (Patient taking differently: Take 40 mg by mouth every morning. ) 02/27/2017: Reports taking daily due to fluid in legs   . gabapentin (NEURONTIN) 300 MG capsule Take 300 mg by mouth 2 (two) times daily.     Marland Kitchen guaiFENesin (ROBITUSSIN) 100 MG/5ML SOLN Take 10 mLs (200 mg total) by mouth 3 (three) times daily.   Marland Kitchen ipratropium-albuterol (DUONEB) 0.5-2.5 (3) MG/3ML SOLN Take 3 mLs by nebulization every 6 (six) hours as needed. 01/30/2017: Not using on regular basis   . metFORMIN (GLUCOPHAGE-XR) 500 MG 24 hr tablet Take 500 mg by mouth daily as needed (low blood sugar).    . methotrexate (RHEUMATREX) 2.5 MG tablet Take 20 mg  by mouth every Monday.    . metoprolol tartrate (LOPRESSOR) 25 MG tablet Take 25 mg by mouth 2 (two) times daily.   Marland Kitchen omeprazole (PRILOSEC) 20 MG capsule Take 20 mg by mouth every morning.     Marland Kitchen Respiratory Therapy Supplies (FLUTTER) DEVI Use 10-15 times daily   . simvastatin (ZOCOR) 20 MG tablet Take 20 mg by mouth every evening.    . warfarin (COUMADIN) 2 MG tablet Take 6 mg for 3 days. RN should come to your home to check the INR on Tuesday. 02/27/2017: Currently taking 2 mg daily, except 4 mg on Monday and Friday  . collagenase (SANTYL) ointment Apply topically daily. (Patient not taking:  Reported on 02/18/2017)   . enoxaparin (LOVENOX) 100 MG/ML injection Inject 1 mL (100 mg total) into the skin daily. (Patient not taking: Reported on 02/27/2017)   . guaiFENesin (MUCINEX) 600 MG 12 hr tablet Take 1 tablet (600 mg total) by mouth 2 (two) times daily. (Patient not taking: Reported on 02/11/2017)   . HYDROmorphone (DILAUDID) 2 MG tablet Take 0.5 tablets (1 mg total) by mouth every 4 (four) hours as needed for severe pain. (Patient not taking: Reported on 02/11/2017)   . polyethylene glycol (MIRALAX / GLYCOLAX) packet Take 17 g by mouth daily as needed for mild constipation. (Patient not taking: Reported on 02/14/2017)   . sodium chloride HYPERTONIC 3 % nebulizer solution Take 4 mLs by nebulization 2 (two) times daily.    No facility-administered encounter medications on file as of 02/27/2017.     Functional Status:   In your present state of health, do you have any difficulty performing the following activities: 02/27/2017 02/18/2017  Hearing? Y N  Vision? N N  Difficulty concentrating or making decisions? N N  Walking or climbing stairs? Y Y  Dressing or bathing? N N  Doing errands, shopping? Y -  Comment family assist  -  Quarry manager and eating ? Y -  Comment famiy helps  -  Using the Toilet? N -  In the past six months, have you accidently leaked urine? Y -  Comment wears depends  -  Do you have problems with loss of bowel control? N -  Managing your Medications? N -  Managing your Finances? N -  Housekeeping or managing your Housekeeping? Y -  Comment family assist  -  Some recent data might be hidden    Fall/Depression Screening:    Fall Risk  01/30/2017  Falls in the past year? Yes  Number falls in past yr: 1  Injury with Fall? No  Risk for fall due to : History of fall(s)  Follow up Falls evaluation completed;Education provided;Falls prevention discussed   PHQ 2/9 Scores 01/30/2017  PHQ - 2 Score 2  PHQ- 9 Score 6  Exception Documentation Patient refusal     Assessment:  Routine home visit  1.Recent Kyhoplasty - reports pain tolerance, has bandaid to incision area, declined allowing RN to remove, no redness around area. 2,Sacral wound - has dressing in place, weekly dressing change by [facility] , has upcoming appointment at wound clinic.   3.Decreased appetite- continues to supplement diet with ensure drinks 2 to 3 times a day, reinforced to use as supplement instead of replacement. Discussed small meals more frequent meals .  4,Fall risk -  No recent falls, using walker, participating with home health therapy. 5. Respiratory condition - reports using flutter valve at least twice daily, and prn nebulizer, no increase in shortness of  breath, usual cough.    Plan:  1. Patient to attend follow up postop  appointment with Dr.Menz on 10/1 and PCP on 10/2 2. Reinforced continued,changing position during the day, standing at least hourly to redistribute pressure to sacral area 3. Education on recommendations related to decreased appetite, smaller portions , nutritious food suggestions.    4.Reviewed fall precautions measures .   Placed call to [facility] from  [facility] home health to follow up on INR result, states she will call [facility] for results. Placed call to Dr.McAlhany office spoke with Jasmine December at office regarding INR results, reports she will follow up with [facility] home health and return call to  patient back.  Will send MD visit note.    Egbert Garibaldi, RN, [facility] Care Management,Care Management Coordinator  [phone]- Mobile [phone]- Toll Free Main Office

## 2017-02-28 ENCOUNTER — Ambulatory Visit: Payer: Self-pay | Admitting: *Deleted

## 2017-02-28 DIAGNOSIS — M109 Gout, unspecified: Secondary | ICD-10-CM | POA: Diagnosis not present

## 2017-02-28 DIAGNOSIS — I5022 Chronic systolic (congestive) heart failure: Secondary | ICD-10-CM | POA: Diagnosis not present

## 2017-02-28 DIAGNOSIS — I2510 Atherosclerotic heart disease of native coronary artery without angina pectoris: Secondary | ICD-10-CM | POA: Diagnosis not present

## 2017-02-28 DIAGNOSIS — L89312 Pressure ulcer of right buttock, stage 2: Secondary | ICD-10-CM | POA: Diagnosis not present

## 2017-02-28 DIAGNOSIS — I110 Hypertensive heart disease with heart failure: Secondary | ICD-10-CM | POA: Diagnosis not present

## 2017-02-28 DIAGNOSIS — L89322 Pressure ulcer of left buttock, stage 2: Secondary | ICD-10-CM | POA: Diagnosis not present

## 2017-02-28 DIAGNOSIS — M1990 Unspecified osteoarthritis, unspecified site: Secondary | ICD-10-CM | POA: Diagnosis not present

## 2017-02-28 DIAGNOSIS — M4854XD Collapsed vertebra, not elsewhere classified, thoracic region, subsequent encounter for fracture with routine healing: Secondary | ICD-10-CM | POA: Diagnosis not present

## 2017-02-28 DIAGNOSIS — E1140 Type 2 diabetes mellitus with diabetic neuropathy, unspecified: Secondary | ICD-10-CM | POA: Diagnosis not present

## 2017-03-04 ENCOUNTER — Ambulatory Visit (INDEPENDENT_AMBULATORY_CARE_PROVIDER_SITE_OTHER): Payer: Medicare HMO | Admitting: Pharmacist Clinician (PhC)/ Clinical Pharmacy Specialist

## 2017-03-04 ENCOUNTER — Encounter (HOSPITAL_COMMUNITY): Payer: Self-pay

## 2017-03-04 ENCOUNTER — Inpatient Hospital Stay (HOSPITAL_COMMUNITY)
Admission: EM | Admit: 2017-03-04 | Discharge: 2017-03-07 | DRG: 640 | Disposition: A | Discharge status: Home Health | Payer: Medicare HMO | Attending: Internal Medicine | Admitting: Internal Medicine

## 2017-03-04 DIAGNOSIS — Z7901 Long term (current) use of anticoagulants: Secondary | ICD-10-CM

## 2017-03-04 DIAGNOSIS — Z951 Presence of aortocoronary bypass graft: Secondary | ICD-10-CM

## 2017-03-04 DIAGNOSIS — R42 Dizziness and giddiness: Secondary | ICD-10-CM | POA: Diagnosis not present

## 2017-03-04 DIAGNOSIS — M069 Rheumatoid arthritis, unspecified: Secondary | ICD-10-CM | POA: Diagnosis not present

## 2017-03-04 DIAGNOSIS — E1140 Type 2 diabetes mellitus with diabetic neuropathy, unspecified: Secondary | ICD-10-CM | POA: Diagnosis not present

## 2017-03-04 DIAGNOSIS — Z880 Allergy status to penicillin: Secondary | ICD-10-CM

## 2017-03-04 DIAGNOSIS — E1169 Type 2 diabetes mellitus with other specified complication: Secondary | ICD-10-CM | POA: Diagnosis not present

## 2017-03-04 DIAGNOSIS — L89312 Pressure ulcer of right buttock, stage 2: Secondary | ICD-10-CM | POA: Diagnosis not present

## 2017-03-04 DIAGNOSIS — Z9981 Dependence on supplemental oxygen: Secondary | ICD-10-CM

## 2017-03-04 DIAGNOSIS — L89322 Pressure ulcer of left buttock, stage 2: Secondary | ICD-10-CM | POA: Diagnosis not present

## 2017-03-04 DIAGNOSIS — L8990 Pressure ulcer of unspecified site, unspecified stage: Secondary | ICD-10-CM | POA: Diagnosis present

## 2017-03-04 DIAGNOSIS — Q893 Situs inversus: Secondary | ICD-10-CM

## 2017-03-04 DIAGNOSIS — G249 Dystonia, unspecified: Secondary | ICD-10-CM | POA: Diagnosis present

## 2017-03-04 DIAGNOSIS — I2510 Atherosclerotic heart disease of native coronary artery without angina pectoris: Secondary | ICD-10-CM | POA: Diagnosis not present

## 2017-03-04 DIAGNOSIS — J329 Chronic sinusitis, unspecified: Secondary | ICD-10-CM | POA: Diagnosis present

## 2017-03-04 DIAGNOSIS — M810 Age-related osteoporosis without current pathological fracture: Secondary | ICD-10-CM | POA: Diagnosis present

## 2017-03-04 DIAGNOSIS — E876 Hypokalemia: Secondary | ICD-10-CM | POA: Diagnosis not present

## 2017-03-04 DIAGNOSIS — Z86718 Personal history of other venous thrombosis and embolism: Secondary | ICD-10-CM

## 2017-03-04 DIAGNOSIS — Z86711 Personal history of pulmonary embolism: Secondary | ICD-10-CM

## 2017-03-04 DIAGNOSIS — J9811 Atelectasis: Secondary | ICD-10-CM | POA: Diagnosis not present

## 2017-03-04 DIAGNOSIS — I110 Hypertensive heart disease with heart failure: Secondary | ICD-10-CM | POA: Diagnosis not present

## 2017-03-04 DIAGNOSIS — Z91013 Allergy to seafood: Secondary | ICD-10-CM

## 2017-03-04 DIAGNOSIS — Z6827 Body mass index (BMI) 27.0-27.9, adult: Secondary | ICD-10-CM

## 2017-03-04 DIAGNOSIS — E860 Dehydration: Secondary | ICD-10-CM | POA: Diagnosis not present

## 2017-03-04 DIAGNOSIS — I471 Supraventricular tachycardia, unspecified: Secondary | ICD-10-CM | POA: Diagnosis present

## 2017-03-04 DIAGNOSIS — J984 Other disorders of lung: Secondary | ICD-10-CM | POA: Diagnosis not present

## 2017-03-04 DIAGNOSIS — E1122 Type 2 diabetes mellitus with diabetic chronic kidney disease: Secondary | ICD-10-CM | POA: Diagnosis present

## 2017-03-04 DIAGNOSIS — M1991 Primary osteoarthritis, unspecified site: Secondary | ICD-10-CM | POA: Diagnosis present

## 2017-03-04 DIAGNOSIS — M1990 Unspecified osteoarthritis, unspecified site: Secondary | ICD-10-CM | POA: Diagnosis not present

## 2017-03-04 DIAGNOSIS — I255 Ischemic cardiomyopathy: Secondary | ICD-10-CM | POA: Diagnosis present

## 2017-03-04 DIAGNOSIS — R000 Tachycardia, unspecified: Secondary | ICD-10-CM | POA: Diagnosis not present

## 2017-03-04 DIAGNOSIS — K219 Gastro-esophageal reflux disease without esophagitis: Secondary | ICD-10-CM | POA: Diagnosis present

## 2017-03-04 DIAGNOSIS — R918 Other nonspecific abnormal finding of lung field: Secondary | ICD-10-CM | POA: Diagnosis not present

## 2017-03-04 DIAGNOSIS — R531 Weakness: Secondary | ICD-10-CM

## 2017-03-04 DIAGNOSIS — M109 Gout, unspecified: Secondary | ICD-10-CM | POA: Diagnosis present

## 2017-03-04 DIAGNOSIS — E785 Hyperlipidemia, unspecified: Secondary | ICD-10-CM | POA: Diagnosis present

## 2017-03-04 DIAGNOSIS — Z5181 Encounter for therapeutic drug level monitoring: Secondary | ICD-10-CM | POA: Diagnosis not present

## 2017-03-04 DIAGNOSIS — I130 Hypertensive heart and chronic kidney disease with heart failure and stage 1 through stage 4 chronic kidney disease, or unspecified chronic kidney disease: Secondary | ICD-10-CM | POA: Diagnosis present

## 2017-03-04 DIAGNOSIS — Q348 Other specified congenital malformations of respiratory system: Secondary | ICD-10-CM

## 2017-03-04 DIAGNOSIS — F329 Major depressive disorder, single episode, unspecified: Secondary | ICD-10-CM | POA: Diagnosis not present

## 2017-03-04 DIAGNOSIS — J9621 Acute and chronic respiratory failure with hypoxia: Secondary | ICD-10-CM | POA: Diagnosis present

## 2017-03-04 DIAGNOSIS — E119 Type 2 diabetes mellitus without complications: Secondary | ICD-10-CM

## 2017-03-04 DIAGNOSIS — R1010 Upper abdominal pain, unspecified: Secondary | ICD-10-CM | POA: Diagnosis not present

## 2017-03-04 DIAGNOSIS — M4854XD Collapsed vertebra, not elsewhere classified, thoracic region, subsequent encounter for fracture with routine healing: Secondary | ICD-10-CM | POA: Diagnosis not present

## 2017-03-04 DIAGNOSIS — R55 Syncope and collapse: Secondary | ICD-10-CM | POA: Diagnosis not present

## 2017-03-04 DIAGNOSIS — Z881 Allergy status to other antibiotic agents status: Secondary | ICD-10-CM

## 2017-03-04 DIAGNOSIS — R0902 Hypoxemia: Secondary | ICD-10-CM | POA: Diagnosis not present

## 2017-03-04 DIAGNOSIS — S32010A Wedge compression fracture of first lumbar vertebra, initial encounter for closed fracture: Secondary | ICD-10-CM | POA: Diagnosis not present

## 2017-03-04 DIAGNOSIS — I2692 Saddle embolus of pulmonary artery without acute cor pulmonale: Secondary | ICD-10-CM | POA: Diagnosis present

## 2017-03-04 DIAGNOSIS — I5022 Chronic systolic (congestive) heart failure: Secondary | ICD-10-CM | POA: Diagnosis present

## 2017-03-04 DIAGNOSIS — N183 Chronic kidney disease, stage 3 unspecified: Secondary | ICD-10-CM | POA: Diagnosis present

## 2017-03-04 DIAGNOSIS — Z885 Allergy status to narcotic agent status: Secondary | ICD-10-CM

## 2017-03-04 DIAGNOSIS — I252 Old myocardial infarction: Secondary | ICD-10-CM | POA: Diagnosis not present

## 2017-03-04 DIAGNOSIS — Z79899 Other long term (current) drug therapy: Secondary | ICD-10-CM

## 2017-03-04 DIAGNOSIS — Z7982 Long term (current) use of aspirin: Secondary | ICD-10-CM

## 2017-03-04 DIAGNOSIS — J479 Bronchiectasis, uncomplicated: Secondary | ICD-10-CM | POA: Diagnosis not present

## 2017-03-04 DIAGNOSIS — E440 Moderate protein-calorie malnutrition: Secondary | ICD-10-CM | POA: Diagnosis not present

## 2017-03-04 DIAGNOSIS — R1111 Vomiting without nausea: Secondary | ICD-10-CM | POA: Diagnosis not present

## 2017-03-04 DIAGNOSIS — F32A Depression, unspecified: Secondary | ICD-10-CM

## 2017-03-04 DIAGNOSIS — I499 Cardiac arrhythmia, unspecified: Secondary | ICD-10-CM | POA: Diagnosis not present

## 2017-03-04 DIAGNOSIS — Z7984 Long term (current) use of oral hypoglycemic drugs: Secondary | ICD-10-CM

## 2017-03-04 LAB — CBC WITH DIFFERENTIAL/PLATELET
BASOS ABS: 0 10*3/uL (ref 0.0–0.1)
Basophils Relative: 0 %
Eosinophils Absolute: 0.1 10*3/uL (ref 0.0–0.7)
Eosinophils Relative: 0 %
HEMATOCRIT: 35.4 % — AB (ref 36.0–46.0)
HEMOGLOBIN: 11.2 g/dL — AB (ref 12.0–15.0)
LYMPHS ABS: 5.2 10*3/uL — AB (ref 0.7–4.0)
LYMPHS PCT: 31 %
MCH: 31.8 pg (ref 26.0–34.0)
MCHC: 31.6 g/dL (ref 30.0–36.0)
MCV: 100.6 fL — AB (ref 78.0–100.0)
Monocytes Absolute: 1.2 10*3/uL — ABNORMAL HIGH (ref 0.1–1.0)
Monocytes Relative: 7 %
NEUTROS ABS: 10.3 10*3/uL — AB (ref 1.7–7.7)
NEUTROS PCT: 62 %
PLATELETS: 308 10*3/uL (ref 150–400)
RBC: 3.52 MIL/uL — AB (ref 3.87–5.11)
RDW: 16.4 % — ABNORMAL HIGH (ref 11.5–15.5)
WBC: 16.7 10*3/uL — AB (ref 4.0–10.5)

## 2017-03-04 LAB — URINALYSIS, ROUTINE W REFLEX MICROSCOPIC
Bilirubin Urine: NEGATIVE
GLUCOSE, UA: NEGATIVE mg/dL
Hgb urine dipstick: NEGATIVE
KETONES UR: 5 mg/dL — AB
Nitrite: NEGATIVE
PROTEIN: NEGATIVE mg/dL
Specific Gravity, Urine: 1.005 (ref 1.005–1.030)
pH: 7 (ref 5.0–8.0)

## 2017-03-04 LAB — COMPREHENSIVE METABOLIC PANEL
ALT: 15 U/L (ref 14–54)
ANION GAP: 12 (ref 5–15)
AST: 21 U/L (ref 15–41)
Albumin: 2.3 g/dL — ABNORMAL LOW (ref 3.5–5.0)
Alkaline Phosphatase: 93 U/L (ref 38–126)
BUN: 11 mg/dL (ref 6–20)
CHLORIDE: 85 mmol/L — AB (ref 101–111)
CO2: 37 mmol/L — ABNORMAL HIGH (ref 22–32)
Calcium: 8.5 mg/dL — ABNORMAL LOW (ref 8.9–10.3)
Creatinine, Ser: 1.05 mg/dL — ABNORMAL HIGH (ref 0.44–1.00)
GFR, EST AFRICAN AMERICAN: 59 mL/min — AB (ref >60)
GFR, EST NON AFRICAN AMERICAN: 51 mL/min — AB (ref >60)
Glucose, Bld: 98 mg/dL (ref 65–99)
Potassium: 2.3 mmol/L — CL (ref 3.5–5.1)
Sodium: 134 mmol/L — ABNORMAL LOW (ref 135–145)
TOTAL PROTEIN: 6.3 g/dL — AB (ref 6.5–8.1)
Total Bilirubin: 1.2 mg/dL (ref 0.3–1.2)

## 2017-03-04 LAB — TROPONIN I

## 2017-03-04 LAB — POCT INR: INR: 4.1

## 2017-03-04 LAB — MAGNESIUM: Magnesium: 1.6 mg/dL — ABNORMAL LOW (ref 1.7–2.4)

## 2017-03-04 MED ORDER — POTASSIUM CHLORIDE CRYS ER 20 MEQ PO TBCR
40.0000 meq | EXTENDED_RELEASE_TABLET | Freq: Once | ORAL | Status: DC
  Filled 2017-03-04: qty 2

## 2017-03-04 MED ORDER — POTASSIUM CHLORIDE 10 MEQ/100ML IV SOLN
10.0000 meq | INTRAVENOUS | Status: DC
  Administered 2017-03-04 – 2017-03-05 (×3): 10 meq via INTRAVENOUS
  Filled 2017-03-04 (×3): qty 100

## 2017-03-04 MED ORDER — ONDANSETRON HCL 4 MG/2ML IJ SOLN
4.0000 mg | Freq: Once | INTRAMUSCULAR | Status: AC
  Administered 2017-03-04: 4 mg via INTRAVENOUS
  Filled 2017-03-04: qty 2

## 2017-03-04 MED ORDER — SODIUM CHLORIDE 0.9 % IV BOLUS (SEPSIS)
500.0000 mL | Freq: Once | INTRAVENOUS | Status: AC
  Administered 2017-03-04: 500 mL via INTRAVENOUS

## 2017-03-04 MED ORDER — SODIUM CHLORIDE 0.9 % IV SOLN
INTRAVENOUS | Status: DC
  Administered 2017-03-04: 21:00:00 via INTRAVENOUS

## 2017-03-04 MED ORDER — MAGNESIUM SULFATE 2 GM/50ML IV SOLN
2.0000 g | Freq: Once | INTRAVENOUS | Status: AC
  Administered 2017-03-04: 2 g via INTRAVENOUS
  Filled 2017-03-04: qty 50

## 2017-03-04 NOTE — ED Notes (Signed)
Pt using an external female catheter. 

## 2017-03-04 NOTE — ED Provider Notes (Signed)
[facility] DEPT Provider Note   CSN: [id number] Arrival date & time: 03/04/17  1734     History   Chief Complaint Chief Complaint  Patient presents with  . Near Syncope    HPI [name] is a 76 y.o. female.  She presents for evaluation of general weakness, decreased oral intake, periods of dizziness, and "going out of it."  She was at home today with her home health nurse, who was concerned that the patient was transiently unresponsive.  She also had her INR checked today and it was elevated at 4.  Patient is recovering from recent kyphoplasty, at home, where she lives alone.  She is nauseated but does not have vomiting.  She has periods of weakness that which makes it hard to walk.  She is now using oxygen at home 24/7, and apparently prior to the recent hospitalization, did not require it except at night.  She denies cough, chest pain, fever, chills, paresthesia or focal weakness.  She is taking her usual medications, without relief.  The patient was transferred by EMS, who noted that she had an irregular heartbeat, at 210/min, which spontaneously resolved.  There are no other known modifying factors.  HPI  Past Medical History:  Diagnosis Date  . Allergy   . Anemia   . Arthritis   . Blood transfusion without reported diagnosis   . CAD (coronary artery disease)    NSTEMI in setting of gallstone pancreatitis 05/2011:  LHC with 3v CAD; CABG was performed 06/14/11: RIMA-LAD, SVG-ramus, SVG-RCA  . Carotid stenosis    Dopplers 06/12/11: LICA 60-79%.  . Cataract   . CHF (congestive heart failure) (HCC)   . Chronic back pain   . Chronic bronchitis   . Chronic kidney disease   . Chronic systolic heart failure (HCC)   . Collagen vascular disease (HCC)   . Dextrocardia   . Diabetes mellitus without complication (HCC)   . DVT of leg (deep venous thrombosis) (HCC) ~2006   left  . Fracture 05/24/2011   right; "did not have surgery"  . GERD (gastroesophageal reflux disease)     . H/O hiatal hernia   . HLD (hyperlipidemia)   . Hypertension   . Ischemic cardiomyopathy    Echocardiogram 06/05/11: EF 40-45%, anteroseptal hypokinesis, apical hypokinesis, mild LAE  . Myocardial infarction (HCC)   . Neuromuscular disorder (HCC)   . Neuropathy   . Osteoporosis   . Oxygen deficiency    2l  . Pneumonia 06/13/11   "couple times; long time ago"  . Pulmonary embolus [facility]) March 2013  . Rheumatoid arthritis (HCC)   . Situs inversus with dextrocardia    CT 06/2011: situs inversus totalis.  What would typically be called RCA arose from anterior sinus of Valsalva.  What would typically be called the left main arises from the posterior sinus of Valsalva and gives rise to a large first diagonal branch and diminutive circumflex    Patient Active Problem List   Diagnosis Date Noted  . Chronic bronchiectasis not affecting current episode of care (HCC) 02/08/2017  . SVT (supraventricular tachycardia) (HCC) 02/08/2017  . 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 Intractable pain 02/08/2017  . Closed compression fracture of L1 lumbar vertebra (HCC)   . Rheumatoid arthritis (HCC) 02/06/2017  . Decubital ulcer 02/06/2017  . CKD (chronic kidney disease) stage 3, GFR 30-59 ml/min (HCC) 02/06/2017  . CAP (community acquired pneumonia) 05/25/2015  . Encounter for therapeutic drug monitoring 08/05/2013  . 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  CAD (coronary artery disease) 09/11/2011  . 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 Acute respiratory failure with hypoxia (HCC) 08/12/2011  . Saddle embolism of pulmonary artery (HCC) 08/12/2011  . Pericardial effusion 08/12/2011  . Atrial mass 08/12/2011  . Dyspnea 08/07/2011  . CAD 07/10/2011  . Cough 07/10/2011  . DM2 (diabetes mellitus, type 2) (HCC) 07/10/2011  . HLD (hyperlipidemia) 07/10/2011  . Carotid stenosis   . Hypokalemia 06/09/2011  . Hypomagnesemia 06/09/2011  . Dextrocardia 06/09/2011  . Acute myocardial  infarction, subendocardial infarction, subsequent episode of care (HCC) 06/08/2011  . Chronic systolic heart failure (HCC) 06/08/2011  . Acute renal failure (HCC) 06/08/2011    Past Surgical History:  Procedure Laterality Date  . ARCH AORTOGRAM Right 06/11/2011   Procedure: ARCH AORTOGRAM;  Surgeon: Kathleene Hazel, MD;  Location: [facility] CATH LAB;  Service: Cardiovascular;  Laterality: Right;  . BACK SURGERY    . CARDIAC CATHETERIZATION  06/11/11  . CHOLECYSTECTOMY    . CORONARY ARTERY BYPASS GRAFT  06/14/2011   Procedure: CORONARY ARTERY BYPASS GRAFTING (CABG);  Surgeon: Alleen Borne, MD;  Location: [facility] OR;  Service: Open Heart Surgery;  Laterality: N/A;  . ERCP  06/03/2011   Procedure: ENDOSCOPIC RETROGRADE CHOLANGIOPANCREATOGRAPHY (ERCP);  Surgeon: Petra Kuba, MD;  Location: [facility] OR;  Service: Endoscopy;  Laterality: N/A;  . KYPHOPLASTY N/A 01/02/2017   Procedure: KYPHOPLASTY L2;  Surgeon: Kennedy Bucker, MD;  Location: [facility] ORS;  Service: Orthopedics;  Laterality: N/A;  . KYPHOPLASTY N/A 02/18/2017   Procedure: [id number];  Surgeon: Kennedy Bucker, MD;  Location: [facility] ORS;  Service: Orthopedics;  Laterality: N/A;  . LEFT HEART CATHETERIZATION WITH CORONARY ANGIOGRAM N/A 06/11/2011   Procedure: LEFT HEART CATHETERIZATION WITH CORONARY ANGIOGRAM;  Surgeon: Kathleene Hazel, MD;  Location: [facility] CATH LAB;  Service: Cardiovascular;  Laterality: N/A;  . VAGINAL HYSTERECTOMY  1970's    OB History    No data available       Home Medications    Prior to Admission medications   Medication Sig Start Date End Date Taking? Authorizing Provider  acetaminophen (TYLENOL) 160 MG/5ML elixir Take 320 mg by mouth every 6 (six) hours as needed for pain.    Yes [provider]  alendronate (FOSAMAX) 70 MG tablet Take 70 mg by mouth every Sunday.    Yes [provider]  allopurinol (ZYLOPRIM) 100 MG tablet Take 100 mg by mouth 2 (two) times daily.    Yes [provider]    aspirin EC 81 MG tablet Take 81 mg by mouth every morning.    Yes [provider]  calcium carbonate (TUMS) 500 MG chewable tablet Chew 1 tablet (200 mg of elemental calcium total) by mouth daily. 02/09/17 02/09/18 Yes Calvert Cantor, MD  cholecalciferol (VITAMIN D) 1000 UNITS tablet Take 1,000 Units by mouth every morning.    Yes [provider]  collagenase (SANTYL) ointment Apply topically daily. Patient taking differently: Apply 1 application topically daily. TO AFFECTED AREA 02/08/17  Yes Calvert Cantor, MD  folic acid (FOLVITE) 800 MCG tablet Take 800 mcg by mouth every morning.    Yes [provider]  furosemide (LASIX) 40 MG tablet Take 1 tablet (40 mg total) by mouth daily as needed for fluid or edema. Take only if you gain 2-3 lb of fluid in a 24 hr period Patient taking differently: Take 40 mg by mouth every morning.  02/08/17 05/09/17 Yes Calvert Cantor, MD  gabapentin (NEURONTIN) 300 MG capsule Take  300 mg by mouth 2 (two) times daily.     Yes [provider]  guaiFENesin (MUCINEX) 600 MG 12 hr tablet Take 1 tablet (600 mg total) by mouth 2 (two) times daily. Patient taking differently: Take 600 mg by mouth 2 (two) times daily as needed for cough or to loosen phlegm.  02/09/17 02/09/18 Yes Calvert Cantor, MD  guaiFENesin (ROBITUSSIN) 100 MG/5ML SOLN Take 10 mLs (200 mg total) by mouth 3 (three) times daily. Patient taking differently: Take 10 mLs by mouth 2 (two) times daily.  02/09/17  Yes Calvert Cantor, MD  metFORMIN (GLUCOPHAGE-XR) 500 MG 24 hr tablet Take 500 mg by mouth 2 (two) times a week.  10/31/16  Yes [provider]  methotrexate (RHEUMATREX) 2.5 MG tablet Take 20 mg by mouth every Monday.    Yes [provider]  metoprolol tartrate (LOPRESSOR) 25 MG tablet Take 25 mg by mouth 2 (two) times daily.   Yes [provider]  omeprazole (PRILOSEC) 20 MG capsule Take 20 mg by mouth every morning.     Yes [provider]   Respiratory Therapy Supplies (FLUTTER) DEVI Use 10-15 times daily 09/10/16  Yes Merwyn Katos, MD  simvastatin (ZOCOR) 20 MG tablet Take 20 mg by mouth every evening.    Yes [provider]  sodium chloride HYPERTONIC 3 % nebulizer solution Take 4 mLs by nebulization 2 (two) times daily. Patient taking differently: Take 4 mLs by nebulization See admin instructions. ONE TO TWO TIMES A DAY 02/09/17  Yes Calvert Cantor, MD  enoxaparin (LOVENOX) 100 MG/ML injection Inject 1 mL (100 mg total) into the skin daily. Patient not taking: Reported on 02/27/2017 02/15/17   Kathleene Hazel, MD  HYDROmorphone (DILAUDID) 2 MG tablet Take 0.5 tablets (1 mg total) by mouth every 4 (four) hours as needed for severe pain. Patient not taking: Reported on 03/04/2017 02/08/17   Calvert Cantor, MD  ipratropium-albuterol (DUONEB) 0.5-2.5 (3) MG/3ML SOLN Take 3 mLs by nebulization every 6 (six) hours as needed. Patient taking differently: Take 3 mLs by nebulization every 6 (six) hours as needed (for shortness of breath or wheezing).  11/14/16   Merwyn Katos, MD  polyethylene glycol (MIRALAX / Ethelene Hal) packet Take 17 g by mouth daily as needed for mild constipation. Patient not taking: Reported on 03/04/2017 02/08/17   Calvert Cantor, MD  warfarin (COUMADIN) 2 MG tablet Take 6 mg for 3 days. RN should come to your home to check the INR on Tuesday. Patient taking differently: Take 2-4 mg by mouth See admin instructions. 2 mg in the evening on Sun/Tues/Wed/Thurs/Sat and 4 mg on Mon/Fri 02/09/17   Calvert Cantor, MD    Family History Family History  Problem Relation Age of Onset  . Heart disease Father   . Heart disease Sister     Social History Social History  Substance Use Topics  . Smoking status: Never Smoker  . Smokeless tobacco: Never Used  . Alcohol use No     Allergies   Penicillins; Shellfish allergy; Hydrocodone; Oxycodone; and Levaquin [levofloxacin]   Review of Systems Review of Systems   All other systems reviewed and are negative.    Physical Exam Updated Vital Signs BP 110/64   Pulse 95   Resp 19   Ht 5\' 2"  (1.575 m)   Wt 68 kg (150 lb)   SpO2 99%   BMI 27.44 kg/m   Physical Exam  Constitutional: She is oriented to person, place, and time. She  appears well-developed. No distress.  Elderly, frail  HENT:  Head: Normocephalic and atraumatic.  Eyes: Pupils are equal, round, and reactive to light. Conjunctivae and EOM are normal.  Neck: Normal range of motion and phonation normal. Neck supple.  Cardiovascular: Normal rate and regular rhythm.   Pulmonary/Chest: Effort normal and breath sounds normal. She exhibits no tenderness.  Abdominal: Soft. She exhibits no distension. There is no tenderness. There is no guarding.  Musculoskeletal: Normal range of motion.  Neurological: She is alert and oriented to person, place, and time. She exhibits normal muscle tone.  Skin: Skin is warm and dry.  Psychiatric: She has a normal mood and affect. Her behavior is normal. Judgment and thought content normal.  Nursing note and vitals reviewed.    ED Treatments / Results  Labs (all labs ordered are listed, but only abnormal results are displayed) Labs Reviewed  COMPREHENSIVE METABOLIC PANEL - Abnormal; Notable for the following:       Result Value   Sodium 134 (*)    Potassium 2.3 (*)    Chloride 85 (*)    CO2 37 (*)    Creatinine, Ser 1.05 (*)    Calcium 8.5 (*)    Total Protein 6.3 (*)    Albumin 2.3 (*)    GFR calc non Af Amer 51 (*)    GFR calc Af Amer 59 (*)    All other components within normal limits  CBC WITH DIFFERENTIAL/PLATELET - Abnormal; Notable for the following:    WBC 16.7 (*)    RBC 3.52 (*)    Hemoglobin 11.2 (*)    HCT 35.4 (*)    MCV 100.6 (*)    RDW 16.4 (*)    Neutro Abs 10.3 (*)    Lymphs Abs 5.2 (*)    Monocytes Absolute 1.2 (*)    All other components within normal limits  URINALYSIS, ROUTINE W REFLEX MICROSCOPIC - Abnormal; Notable  for the following:    Ketones, ur 5 (*)    Leukocytes, UA TRACE (*)    Bacteria, UA RARE (*)    Squamous Epithelial / LPF 0-5 (*)    All other components within normal limits  TROPONIN I  MAGNESIUM    EKG  EKG Interpretation  Date/Time:  Monday March 04 2017 17:43:42 EDT Ventricular Rate:  95 PR Interval:    QRS Duration: 109 QT Interval:  383 QTC Calculation: 482 R Axis:   -115 Text Interpretation:  Right and left arm electrode reversal, interpretation assumes no reversal Sinus or ectopic atrial rhythm Atrial premature complex Inferior infarct, old Anterolateral infarct, age indeterminate Baseline wander in lead(s) V2 Since last tracing arm leads are similar, and no atrial flutter waves seen Confirmed by Mancel Bale [phone]) on 03/04/2017 6:31:31 PM       Radiology No results found.  Procedures Procedures (including critical care time)  Medications Ordered in ED Medications  0.9 %  sodium chloride infusion ( Intravenous New Bag/Given 03/04/17 2047)  potassium chloride SA (K-DUR,KLOR-CON) CR tablet 40 mEq (not administered)  potassium chloride 10 mEq in 100 mL IVPB (not administered)  magnesium sulfate IVPB 2 g 50 mL (not administered)  sodium chloride 0.9 % bolus 500 mL (0 mLs Intravenous Stopped 03/04/17 2046)  ondansetron (ZOFRAN) injection 4 mg (4 mg Intravenous Given 03/04/17 2046)     Initial Impression / Assessment and Plan / ED Course  I have reviewed the triage vital signs and the nursing notes.  Pertinent labs & imaging results that  were available during my care of the patient were reviewed by me and considered in my medical decision making (see chart for details).  Clinical Course as of Mar 04 2232  Mon Mar 04, 2017  2123 The patient's son asked to talk to me outside of her room, where he related that he was concerned that she is not eating because of depression.  He feels this is been worsening recently in the last several weeks, and also he is concerned  that the patient does not get along well with her daughter.  [EW]    Clinical Course User Index [EW] Mancel Bale, MD    Patient Vitals for the past 24 hrs:  BP Pulse Resp SpO2 Height Weight  03/04/17 2045 110/64 95 19 99 % - -  03/04/17 2030 103/64 96 19 99 % - -  03/04/17 2015 113/65 (!) 103 18 98 % - -  03/04/17 2000 102/89 (!) 115 (!) 24 95 % - -  03/04/17 1945 123/75 (!) 102 18 98 % - -  03/04/17 1915 107/69 96 (!) 32 100 % - -  03/04/17 1900 102/61 100 (!) 23 100 % - -  03/04/17 1845 107/72 (!) 106 (!) 23 100 % - -  03/04/17 1830 105/65 100 18 100 % - -  03/04/17 1815 119/82 (!) 108 (!) 26 99 % - -  03/04/17 1800 118/70 (!) 103 (!) 22 100 % - -  03/04/17 1753 - - - - 5\' 2"  (1.575 m) 68 kg (150 lb)  03/04/17 1747 - - - 96 % - -    10:14 PM Reevaluation with update and discussion. After initial assessment and treatment, an updated evaluation reveals no change in clinical status.  Findings discussed with patient, and son, all questions answered. Ozetta Flatley L   10:35 PM-Consult complete with Hospitalist. Patient case explained and discussed. Hospitalist agrees to admit patient for further evaluation and treatment. Call ended at 22:45  CRITICAL CARE Performed by: 05/04/17 Total critical care time: 35 minutes Critical care time was exclusive of separately billable procedures and treating other patients. Critical care was necessary to treat or prevent imminent or life-threatening deterioration. Critical care was time spent personally by me on the following activities: development of treatment plan with patient and/or surrogate as well as nursing, discussions with consultants, evaluation of patient's response to treatment, examination of patient, obtaining history from patient or surrogate, ordering and performing treatments and interventions, ordering and review of laboratory studies, ordering and review of radiographic studies, pulse oximetry and re-evaluation of patient's  condition.   Final Clinical Impressions(s) / ED Diagnoses   Final diagnoses:  Hypokalemia  Tachycardia  Weakness  Depression, unspecified depression type    Weakness, with nonspecific nausea and vomiting.  Patient with marked hypokalemia, associated with use of diuretic, Lasix.  Patient with documented marked tachycardia during EMS transport, which has not been seen here in the emergency department.  It is not clear what the rhythm was, when the rate was greater than 200, because there is no 12-lead EKG done at that time.  The patient will require observation on cardiac monitor, and treatment for hypokalemia.  She is also treated with magnesium for possible hypomagnesemia.  Doubt ACS, PE or pneumonia.  Nursing Notes Reviewed/ Care Coordinated Applicable Imaging Reviewed Interpretation of Laboratory Data incorporated into ED treatment   Plan: Admit  New Prescriptions New Prescriptions   No medications on file     Flint Melter, MD 03/04/17 2256

## 2017-03-04 NOTE — ED Triage Notes (Signed)
Pt arrived via [facility] c/o near syncope and nausea.  Pt had recent back surgeries 2 weeks ago and 4 weeks ago.  Hx dextrocardia.  Pt wears 2L O2 [place] at home

## 2017-03-05 ENCOUNTER — Observation Stay (HOSPITAL_COMMUNITY): Payer: Medicare HMO

## 2017-03-05 ENCOUNTER — Encounter (HOSPITAL_COMMUNITY): Payer: Self-pay | Admitting: Internal Medicine

## 2017-03-05 DIAGNOSIS — R1111 Vomiting without nausea: Secondary | ICD-10-CM | POA: Diagnosis not present

## 2017-03-05 DIAGNOSIS — R55 Syncope and collapse: Secondary | ICD-10-CM | POA: Diagnosis not present

## 2017-03-05 DIAGNOSIS — R42 Dizziness and giddiness: Secondary | ICD-10-CM | POA: Diagnosis not present

## 2017-03-05 DIAGNOSIS — E876 Hypokalemia: Secondary | ICD-10-CM | POA: Diagnosis not present

## 2017-03-05 LAB — PROTIME-INR
INR: 3.32
PROTHROMBIN TIME: 28.2 s — AB (ref 11.4–15.2)
Prothrombin Time: 33.4 seconds — ABNORMAL HIGH (ref 11.4–15.2)

## 2017-03-05 LAB — TROPONIN I

## 2017-03-05 LAB — HEPATIC FUNCTION PANEL
ALBUMIN: 2.1 g/dL — AB (ref 3.5–5.0)
ALT: 11 U/L — ABNORMAL LOW (ref 14–54)
AST: 27 U/L (ref 15–41)
Alkaline Phosphatase: 75 U/L (ref 38–126)
BILIRUBIN DIRECT: 0.5 mg/dL (ref 0.1–0.5)
BILIRUBIN TOTAL: 1.7 mg/dL — AB (ref 0.3–1.2)
Indirect Bilirubin: 1.2 mg/dL — ABNORMAL HIGH (ref 0.3–0.9)
Total Protein: 5.2 g/dL — ABNORMAL LOW (ref 6.5–8.1)

## 2017-03-05 LAB — CBC
HCT: 32.1 % — ABNORMAL LOW (ref 36.0–46.0)
HEMOGLOBIN: 10.2 g/dL — AB (ref 12.0–15.0)
MCH: 32.5 pg (ref 26.0–34.0)
MCHC: 31.8 g/dL (ref 30.0–36.0)
MCV: 102.2 fL — ABNORMAL HIGH (ref 78.0–100.0)
PLATELETS: 213 10*3/uL (ref 150–400)
RBC: 3.14 MIL/uL — AB (ref 3.87–5.11)
RDW: 16.8 % — ABNORMAL HIGH (ref 11.5–15.5)
WBC: 9.4 10*3/uL (ref 4.0–10.5)

## 2017-03-05 LAB — GLUCOSE, CAPILLARY
GLUCOSE-CAPILLARY: 80 mg/dL (ref 65–99)
Glucose-Capillary: 87 mg/dL (ref 65–99)
Glucose-Capillary: 90 mg/dL (ref 65–99)
Glucose-Capillary: 95 mg/dL (ref 65–99)

## 2017-03-05 LAB — BASIC METABOLIC PANEL
Anion gap: 7 (ref 5–15)
BUN: 11 mg/dL (ref 6–20)
CALCIUM: 7.9 mg/dL — AB (ref 8.9–10.3)
CO2: 36 mmol/L — ABNORMAL HIGH (ref 22–32)
CREATININE: 0.96 mg/dL (ref 0.44–1.00)
Chloride: 93 mmol/L — ABNORMAL LOW (ref 101–111)
GFR calc Af Amer: >60 mL/min (ref >60)
GFR, EST NON AFRICAN AMERICAN: 57 mL/min — AB (ref >60)
Glucose, Bld: 75 mg/dL (ref 65–99)
Potassium: 3.7 mmol/L (ref 3.5–5.1)
Sodium: 136 mmol/L (ref 135–145)

## 2017-03-05 LAB — MAGNESIUM: MAGNESIUM: 2.2 mg/dL (ref 1.7–2.4)

## 2017-03-05 LAB — TSH: TSH: 3.14 u[IU]/mL (ref 0.350–4.500)

## 2017-03-05 MED ORDER — GUAIFENESIN 100 MG/5ML PO SOLN
10.0000 mL | Freq: Two times a day (BID) | ORAL | Status: DC
  Administered 2017-03-05 – 2017-03-07 (×5): 200 mg via ORAL
  Filled 2017-03-05 (×2): qty 5
  Filled 2017-03-05: qty 10
  Filled 2017-03-05: qty 5
  Filled 2017-03-05 (×3): qty 10
  Filled 2017-03-05: qty 5

## 2017-03-05 MED ORDER — PANTOPRAZOLE SODIUM 40 MG PO TBEC
40.0000 mg | DELAYED_RELEASE_TABLET | Freq: Every day | ORAL | Status: DC
  Administered 2017-03-05 – 2017-03-06 (×2): 40 mg via ORAL
  Filled 2017-03-05 (×2): qty 1

## 2017-03-05 MED ORDER — ASPIRIN EC 81 MG PO TBEC
81.0000 mg | DELAYED_RELEASE_TABLET | Freq: Every day | ORAL | Status: DC
  Administered 2017-03-05 – 2017-03-07 (×3): 81 mg via ORAL
  Filled 2017-03-05 (×3): qty 1

## 2017-03-05 MED ORDER — VITAMIN D 1000 UNITS PO TABS
1000.0000 [IU] | ORAL_TABLET | Freq: Every day | ORAL | Status: DC
  Administered 2017-03-05 – 2017-03-07 (×3): 1000 [IU] via ORAL
  Filled 2017-03-05 (×3): qty 1

## 2017-03-05 MED ORDER — ONDANSETRON HCL 4 MG/2ML IJ SOLN
4.0000 mg | Freq: Four times a day (QID) | INTRAMUSCULAR | Status: DC | PRN
  Administered 2017-03-05: 4 mg via INTRAVENOUS
  Filled 2017-03-05: qty 2

## 2017-03-05 MED ORDER — SODIUM CHLORIDE 3 % IN NEBU
4.0000 mL | INHALATION_SOLUTION | Freq: Two times a day (BID) | RESPIRATORY_TRACT | Status: DC
  Administered 2017-03-06 – 2017-03-07 (×3): 4 mL via RESPIRATORY_TRACT
  Filled 2017-03-05 (×6): qty 4

## 2017-03-05 MED ORDER — PROMETHAZINE HCL 25 MG/ML IJ SOLN
12.5000 mg | Freq: Once | INTRAMUSCULAR | Status: AC
  Administered 2017-03-05: 12.5 mg via INTRAVENOUS
  Filled 2017-03-05: qty 1

## 2017-03-05 MED ORDER — WARFARIN SODIUM 2 MG PO TABS
2.0000 mg | ORAL_TABLET | Freq: Once | ORAL | Status: AC
  Administered 2017-03-05: 2 mg via ORAL
  Filled 2017-03-05: qty 1

## 2017-03-05 MED ORDER — GABAPENTIN 300 MG PO CAPS
300.0000 mg | ORAL_CAPSULE | Freq: Two times a day (BID) | ORAL | Status: DC
  Administered 2017-03-05 – 2017-03-07 (×5): 300 mg via ORAL
  Filled 2017-03-05 (×5): qty 1

## 2017-03-05 MED ORDER — SIMVASTATIN 20 MG PO TABS
20.0000 mg | ORAL_TABLET | Freq: Every evening | ORAL | Status: DC
  Administered 2017-03-05 – 2017-03-06 (×2): 20 mg via ORAL
  Filled 2017-03-05 (×2): qty 1

## 2017-03-05 MED ORDER — POTASSIUM CHLORIDE IN NACL 20-0.9 MEQ/L-% IV SOLN
INTRAVENOUS | Status: AC
  Administered 2017-03-05: 15:00:00 via INTRAVENOUS
  Filled 2017-03-05 (×2): qty 1000

## 2017-03-05 MED ORDER — METHOTREXATE 2.5 MG PO TABS
20.0000 mg | ORAL_TABLET | ORAL | Status: DC
  Administered 2017-03-05: 20 mg via ORAL
  Filled 2017-03-05: qty 8

## 2017-03-05 MED ORDER — ACETAMINOPHEN 650 MG RE SUPP: 650.0000 mg | Freq: Four times a day (QID) | RECTAL | Status: DC | PRN

## 2017-03-05 MED ORDER — ALLOPURINOL 100 MG PO TABS
100.0000 mg | ORAL_TABLET | Freq: Two times a day (BID) | ORAL | Status: DC
  Administered 2017-03-05 – 2017-03-07 (×5): 100 mg via ORAL
  Filled 2017-03-05 (×5): qty 1

## 2017-03-05 MED ORDER — METOPROLOL TARTRATE 25 MG PO TABS
25.0000 mg | ORAL_TABLET | Freq: Two times a day (BID) | ORAL | Status: DC
  Administered 2017-03-05 – 2017-03-07 (×4): 25 mg via ORAL
  Filled 2017-03-05 (×5): qty 1

## 2017-03-05 MED ORDER — IPRATROPIUM-ALBUTEROL 0.5-2.5 (3) MG/3ML IN SOLN: 3.0000 mL | Freq: Four times a day (QID) | RESPIRATORY_TRACT | Status: DC | PRN

## 2017-03-05 MED ORDER — ONDANSETRON HCL 4 MG PO TABS: 4.0000 mg | ORAL_TABLET | Freq: Four times a day (QID) | ORAL | Status: DC | PRN

## 2017-03-05 MED ORDER — INSULIN ASPART 100 UNIT/ML ~~LOC~~ SOLN: 0.0000 [IU] | Freq: Three times a day (TID) | SUBCUTANEOUS | Status: DC

## 2017-03-05 MED ORDER — WARFARIN - PHARMACIST DOSING INPATIENT
Freq: Every day | Status: DC
  Administered 2017-03-06: 18:00:00

## 2017-03-05 MED ORDER — ACETAMINOPHEN 325 MG PO TABS
650.0000 mg | ORAL_TABLET | Freq: Four times a day (QID) | ORAL | Status: DC | PRN
  Administered 2017-03-06: 650 mg via ORAL
  Filled 2017-03-05: qty 2

## 2017-03-05 MED ORDER — MORPHINE SULFATE (PF) 2 MG/ML IV SOLN
0.5000 mg | INTRAVENOUS | Status: DC | PRN
  Administered 2017-03-05: 0.5 mg via INTRAVENOUS
  Filled 2017-03-05 (×2): qty 1

## 2017-03-05 MED ORDER — GUAIFENESIN ER 600 MG PO TB12: 600.0000 mg | ORAL_TABLET | Freq: Two times a day (BID) | ORAL | Status: DC | PRN

## 2017-03-05 MED ORDER — IOPAMIDOL (ISOVUE-300) INJECTION 61%
INTRAVENOUS | Status: AC
  Filled 2017-03-05: qty 30

## 2017-03-05 MED ORDER — POTASSIUM CHLORIDE CRYS ER 20 MEQ PO TBCR
40.0000 meq | EXTENDED_RELEASE_TABLET | Freq: Every day | ORAL | Status: DC
Start: 2017-03-05 — End: 2017-03-07
  Administered 2017-03-05 – 2017-03-07 (×3): 40 meq via ORAL
  Filled 2017-03-05 (×3): qty 2

## 2017-03-05 MED ORDER — FOLIC ACID 1 MG PO TABS
1.0000 mg | ORAL_TABLET | Freq: Every day | ORAL | Status: DC
  Administered 2017-03-05 – 2017-03-07 (×3): 1 mg via ORAL
  Filled 2017-03-05 (×3): qty 1

## 2017-03-05 NOTE — Care Management Note (Signed)
Case Management Note  Patient Details  Name: [name] MRN: [id number] Date of Birth: [birth year]  Subjective/Objective:    CM following for progression and d/c planning.                 Action/Plan: Noted referrral for [facility] services will follow for orders and arrange as needed.   Expected Discharge Date:  03/06/17               Expected Discharge Plan:  Home w Home Health Services  In-House Referral:     Discharge planning Services  CM Consult  Post Acute Care Choice:    Choice offered to:     DME Arranged:    DME Agency:     HH Arranged:    HH Agency:     Status of Service:  In process, will continue to follow  If discussed at Long Length of Stay Meetings, dates discussed:    Additional Comments:  Starlyn Skeans, RN 03/05/2017, 2:22 PM

## 2017-03-05 NOTE — Evaluation (Signed)
Physical Therapy Evaluation Patient Details Name: [name] MRN: [id number] DOB: 1940/11/25 Today's Date: 03/05/2017   History of Present Illness  [name] is a 76 y.o. female with history of SVT, CAD status post CABG, PE, situs inversus totalis, CKD, primary ciliary dyskinesia, gout who was recently admitted for L1 compression fracture and has undergone kyphoplasty and presently is at home had episodes of fainting spells with palpitations.   Clinical Impression  Patient presents with generalized weakness, dizziness, fatigue and impaired mobility s/p above. Tolerated taking a few steps to get to chair and was pretty wiped from that. Pt with dizziness sitting EOB and in standing but negative for orthostatics, although soft. Pt Mod I PTA and uses RW for ambulation at home. Daughter assists with IADLs and shopping. Encouraged OOB to chair for all meals and transfer to [facility]. Will follow acutely to maximize independence and mobility prior to return home.     Follow Up Recommendations Home health PT;Supervision for mobility/OOB    Equipment Recommendations  None recommended by PT    Recommendations for Other Services       Precautions / Restrictions Precautions Precautions: Fall Precaution Comments: soft BP Restrictions Weight Bearing Restrictions: No      Mobility  Bed Mobility Overal bed mobility: Needs Assistance Bed Mobility: Rolling;Sidelying to Sit Rolling: Min guard Sidelying to sit: Min guard;HOB elevated       General bed mobility comments: Performed log roll technique with use of rail to get to EOB. + dizziness. BP 99/60s.  Transfers Overall transfer level: Needs assistance Equipment used: Rolling walker (2 wheeled) Transfers: Sit to/from Stand Sit to Stand: Min assist         General transfer comment: Min A to power to standing. Good demo of hand placement.   Ambulation/Gait Ambulation/Gait assistance: Min assist Ambulation Distance (Feet): 3  Feet Assistive device: Rolling walker (2 wheeled) Gait Pattern/deviations: Step-to pattern;Trunk flexed Gait velocity: decreased Gait velocity interpretation: <1.8 ft/sec, indicative of risk for recurrent falls General Gait Details: Able to take a few steps to get to chair with Min A for balance. BLE weakness but no buckling.   Stairs            Wheelchair Mobility    Modified Rankin (Stroke Patients Only)       Balance Overall balance assessment: Needs assistance Sitting-balance support: Feet supported;No upper extremity supported Sitting balance-Leahy Scale: Fair     Standing balance support: During functional activity;Bilateral upper extremity supported Standing balance-Leahy Scale: Poor Standing balance comment: Reliant on UEs for support in standing. + dizziness. BP up to 100/53 in standing.                             Pertinent Vitals/Pain Pain Assessment: Faces Faces Pain Scale: Hurts a little bit Pain Location: back Pain Descriptors / Indicators: Sore Pain Intervention(s): Monitored during session;Repositioned    Home Living Family/patient expects to be discharged to:: Private residence Living Arrangements: Alone Available Help at Discharge: Family;Available PRN/intermittently Type of Home: Apartment Home Access: Level entry     Home Layout: One level Home Equipment: Walker - 2 wheels;Cane - single point;Shower seat;Grab bars - tub/shower;Bedside commode      Prior Function Level of Independence: Independent with assistive device(s)         Comments: Uses RW. Does her own ADls. Has HHPT coming out twice/week and [facility] nurse.      Hand Dominance  Dominant Hand: Right    Extremity/Trunk Assessment   Upper Extremity Assessment Upper Extremity Assessment: Defer to OT evaluation    Lower Extremity Assessment Lower Extremity Assessment: Generalized weakness    Cervical / Trunk Assessment Cervical / Trunk Assessment: Kyphotic   Communication   Communication: No difficulties  Cognition Arousal/Alertness: Awake/alert Behavior During Therapy: WFL for tasks assessed/performed Overall Cognitive Status: Within Functional Limits for tasks assessed                                        General Comments      Exercises     Assessment/Plan    PT Assessment Patient needs continued PT services  PT Problem List Decreased strength;Decreased mobility;Pain;Decreased balance;Cardiopulmonary status limiting activity;Decreased activity tolerance       PT Treatment Interventions Therapeutic activities;Gait training;Therapeutic exercise;Patient/family education;Functional mobility training;Balance training    PT Goals (Current goals can be found in the Care Plan section)  Acute Rehab PT Goals Patient Stated Goal: to return home PT Goal Formulation: With patient Time For Goal Achievement: 03/19/17 Potential to Achieve Goals: Good    Frequency Min 3X/week   Barriers to discharge Decreased caregiver support lives alone    Co-evaluation               AM-PAC PT "6 Clicks" Daily Activity  Outcome Measure Difficulty turning over in bed (including adjusting bedclothes, sheets and blankets)?: None Difficulty moving from lying on back to sitting on the side of the bed? : None Difficulty sitting down on and standing up from a chair with arms (e.g., wheelchair, bedside commode, etc,.)?: Unable Help needed moving to and from a bed to chair (including a wheelchair)?: A Little Help needed walking in hospital room?: A Little Help needed climbing 3-5 steps with a railing? : A Lot 6 Click Score: 17    End of Session Equipment Utilized During Treatment: Gait belt;Oxygen Activity Tolerance: Treatment limited secondary to medical complications (Comment) (dizziness) Patient left: in chair;with call bell/phone within reach;with chair alarm set Nurse Communication: Mobility status PT Visit Diagnosis: Muscle  weakness (generalized) (M62.81);Pain;Dizziness and giddiness (R42) Pain - part of body:  (back)    Time: 1400-1430 PT Time Calculation (min) (ACUTE ONLY): 30 min   Charges:   PT Evaluation $PT Eval Low Complexity: 1 Low PT Treatments $Therapeutic Activity: 8-22 mins   PT G Codes:   PT G-Codes **NOT FOR INPATIENT CLASS** Functional Assessment Tool Used: Clinical judgement Functional Limitation: Mobility: Walking and moving around Mobility: Walking and Moving Around Current Status (H9622): At least 20 percent but less than 40 percent impaired, limited or restricted Mobility: Walking and Moving Around Goal Status [phone]): At least 1 percent but less than 20 percent impaired, limited or restricted    [place], , [place] [phone]    Marcy Panning 03/05/2017, 2:36 PM

## 2017-03-05 NOTE — Care Management Obs Status (Signed)
MEDICARE OBSERVATION STATUS NOTIFICATION   Patient Details  Name: EVANGELIA WHITAKER MRN: [id number] Date of Birth: 1940/09/12   Medicare Observation Status Notification Given:  Yes    Lawerance Sabal, RN 03/05/2017, 10:13 AM

## 2017-03-05 NOTE — H&P (Signed)
History and Physical    [name] WUX:[id number] DOB: 1940-08-29 DOA: 03/04/2017  PCP: Elias Else, MD  Patient coming from: Home.  Chief Complaint: Dizzy spells.  HPI: [name] is a 76 y.o. female with history of SVT, CAD status post CABG, pulmonary embolism, situs inversus totalis, chronic kidney disease, primary ciliary dyskinesia, gout who was recently admitted for L1 compression fracture and has undergone kyphoplasty and presently is at home had episodes of fainting spells with palpitations. Patient states this happened multiple times yesterday over the course of the day. Denies any chest pain or shortness of breath. Denies any change in medications. EMS was called and patient was brought to the ER. On the way does note that patient's heart rate had reached up to 200 bpm.   Patient states over the last few days patient has been having poor appetite with nausea but denies any abdominal pain. Denies any diarrhea.  ED Course: In the ER patient's EKG shows normal sinus rhythm with atrial premature complexes. Patient is being admitted for further observation of near-syncope likely could be arrhythmia with history of SVT. Patient during recent admission also had spells of SVT. Patient's labs revealed hypokalemia probably from poor appetite and intake. Patient was given potassium replacement and also was given IV fluids for possible dehydration. On exam patient does have lower extremity edema.  Review of Systems: As per HPI, rest all negative.   Past Medical History:  Diagnosis Date  . Allergy   . Anemia   . Arthritis   . Blood transfusion without reported diagnosis   . CAD (coronary artery disease)    NSTEMI in setting of gallstone pancreatitis 05/2011:  LHC with 3v CAD; CABG was performed 06/14/11: RIMA-LAD, SVG-ramus, SVG-RCA  . Carotid stenosis    Dopplers 06/12/11: LICA 60-79%.  . Cataract   . CHF (congestive heart failure) (HCC)   . Chronic back pain   . Chronic  bronchitis   . Chronic kidney disease   . Chronic systolic heart failure (HCC)   . Collagen vascular disease (HCC)   . Dextrocardia   . Diabetes mellitus without complication (HCC)   . DVT of leg (deep venous thrombosis) (HCC) ~2006   left  . Fracture 05/24/2011   right; "did not have surgery"  . GERD (gastroesophageal reflux disease)   . H/O hiatal hernia   . HLD (hyperlipidemia)   . Hypertension   . Ischemic cardiomyopathy    Echocardiogram 06/05/11: EF 40-45%, anteroseptal hypokinesis, apical hypokinesis, mild LAE  . Myocardial infarction (HCC)   . Neuromuscular disorder (HCC)   . Neuropathy   . Osteoporosis   . Oxygen deficiency    2l  . Pneumonia 06/13/11   "couple times; long time ago"  . Pulmonary embolus [facility]) March 2013  . Rheumatoid arthritis (HCC)   . Situs inversus with dextrocardia    CT 06/2011: situs inversus totalis.  What would typically be called RCA arose from anterior sinus of Valsalva.  What would typically be called the left main arises from the posterior sinus of Valsalva and gives rise to a large first diagonal branch and diminutive circumflex    Past Surgical History:  Procedure Laterality Date  . ARCH AORTOGRAM Right 06/11/2011   Procedure: ARCH AORTOGRAM;  Surgeon: Kathleene Hazel, MD;  Location: [facility] CATH LAB;  Service: Cardiovascular;  Laterality: Right;  . BACK SURGERY    . CARDIAC CATHETERIZATION  06/11/11  . CHOLECYSTECTOMY    . CORONARY ARTERY BYPASS GRAFT  06/14/2011   Procedure: CORONARY ARTERY BYPASS GRAFTING (CABG);  Surgeon: Alleen Borne, MD;  Location: [facility] OR;  Service: Open Heart Surgery;  Laterality: N/A;  . ERCP  06/03/2011   Procedure: ENDOSCOPIC RETROGRADE CHOLANGIOPANCREATOGRAPHY (ERCP);  Surgeon: Petra Kuba, MD;  Location: [facility] OR;  Service: Endoscopy;  Laterality: N/A;  . KYPHOPLASTY N/A 01/02/2017   Procedure: KYPHOPLASTY L2;  Surgeon: Kennedy Bucker, MD;  Location: [facility] ORS;  Service: Orthopedics;  Laterality: N/A;  . KYPHOPLASTY  N/A 02/18/2017   Procedure: [id number];  Surgeon: Kennedy Bucker, MD;  Location: [facility] ORS;  Service: Orthopedics;  Laterality: N/A;  . LEFT HEART CATHETERIZATION WITH CORONARY ANGIOGRAM N/A 06/11/2011   Procedure: LEFT HEART CATHETERIZATION WITH CORONARY ANGIOGRAM;  Surgeon: Kathleene Hazel, MD;  Location: [facility] CATH LAB;  Service: Cardiovascular;  Laterality: N/A;  . VAGINAL HYSTERECTOMY  1970's     reports that she has never smoked. She has never used smokeless tobacco. She reports that she does not drink alcohol or use drugs.  Allergies  Allergen Reactions  . Penicillins Rash    Has patient had a PCN reaction causing immediate rash, facial/tongue/throat swelling, SOB or lightheadedness with hypotension: Yes Has patient had a PCN reaction causing severe rash involving mucus membranes or skin necrosis: No Has patient had a PCN reaction that required hospitalization: No Has patient had a PCN reaction occurring within the last 10 years: No If all of the above answers are "NO", then may proceed with Cephalosporin use.   . Shellfish Allergy Anaphylaxis and Rash  . Hydrocodone Other (See Comments)    Pt stated "it's too high-powered, so it sends me to outer space"  . Oxycodone Other (See Comments)    Pt stated "it's too high-powered, so it sends me to outer space"  . Levaquin [Levofloxacin] Hives    Family History  Problem Relation Age of Onset  . Heart disease Father   . Heart disease Sister     Prior to Admission medications   Medication Sig Start Date End Date Taking? Authorizing Provider  acetaminophen (TYLENOL) 160 MG/5ML elixir Take 320 mg by mouth every 6 (six) hours as needed for pain.    Yes [provider]  alendronate (FOSAMAX) 70 MG tablet Take 70 mg by mouth every Sunday.    Yes [provider]  allopurinol (ZYLOPRIM) 100 MG tablet Take 100 mg by mouth 2 (two) times daily.    Yes [provider]  aspirin EC 81 MG tablet Take 81 mg by mouth  every morning.    Yes [provider]  calcium carbonate (TUMS) 500 MG chewable tablet Chew 1 tablet (200 mg of elemental calcium total) by mouth daily. 02/09/17 02/09/18 Yes Calvert Cantor, MD  cholecalciferol (VITAMIN D) 1000 UNITS tablet Take 1,000 Units by mouth every morning.    Yes [provider]  collagenase (SANTYL) ointment Apply topically daily. Patient taking differently: Apply 1 application topically daily. TO AFFECTED AREA 02/08/17  Yes Calvert Cantor, MD  folic acid (FOLVITE) 800 MCG tablet Take 800 mcg by mouth every morning.    Yes [provider]  furosemide (LASIX) 40 MG tablet Take 1 tablet (40 mg total) by mouth daily as needed for fluid or edema. Take only if you gain 2-3 lb of fluid in a 24 hr period Patient taking differently: Take 40 mg by mouth every morning.  02/08/17 05/09/17 Yes Calvert Cantor, MD  gabapentin (NEURONTIN) 300 MG capsule Take 300 mg by mouth 2 (two) times daily.  Yes [provider]  guaiFENesin (MUCINEX) 600 MG 12 hr tablet Take 1 tablet (600 mg total) by mouth 2 (two) times daily. Patient taking differently: Take 600 mg by mouth 2 (two) times daily as needed for cough or to loosen phlegm.  02/09/17 02/09/18 Yes Calvert Cantor, MD  guaiFENesin (ROBITUSSIN) 100 MG/5ML SOLN Take 10 mLs (200 mg total) by mouth 3 (three) times daily. Patient taking differently: Take 10 mLs by mouth 2 (two) times daily.  02/09/17  Yes Calvert Cantor, MD  metFORMIN (GLUCOPHAGE-XR) 500 MG 24 hr tablet Take 500 mg by mouth 2 (two) times a week.  10/31/16  Yes [provider]  methotrexate (RHEUMATREX) 2.5 MG tablet Take 20 mg by mouth every Monday.    Yes [provider]  metoprolol tartrate (LOPRESSOR) 25 MG tablet Take 25 mg by mouth 2 (two) times daily.   Yes [provider]  omeprazole (PRILOSEC) 20 MG capsule Take 20 mg by mouth every morning.     Yes [provider]  Respiratory Therapy Supplies (FLUTTER) DEVI Use 10-15  times daily 09/10/16  Yes Merwyn Katos, MD  simvastatin (ZOCOR) 20 MG tablet Take 20 mg by mouth every evening.    Yes [provider]  sodium chloride HYPERTONIC 3 % nebulizer solution Take 4 mLs by nebulization 2 (two) times daily. Patient taking differently: Take 4 mLs by nebulization See admin instructions. ONE TO TWO TIMES A DAY 02/09/17  Yes Calvert Cantor, MD  enoxaparin (LOVENOX) 100 MG/ML injection Inject 1 mL (100 mg total) into the skin daily. Patient not taking: Reported on 02/27/2017 02/15/17   Kathleene Hazel, MD  HYDROmorphone (DILAUDID) 2 MG tablet Take 0.5 tablets (1 mg total) by mouth every 4 (four) hours as needed for severe pain. Patient not taking: Reported on 03/04/2017 02/08/17   Calvert Cantor, MD  ipratropium-albuterol (DUONEB) 0.5-2.5 (3) MG/3ML SOLN Take 3 mLs by nebulization every 6 (six) hours as needed. Patient taking differently: Take 3 mLs by nebulization every 6 (six) hours as needed (for shortness of breath or wheezing).  11/14/16   Merwyn Katos, MD  polyethylene glycol (MIRALAX / Ethelene Hal) packet Take 17 g by mouth daily as needed for mild constipation. Patient not taking: Reported on 03/04/2017 02/08/17   Calvert Cantor, MD  warfarin (COUMADIN) 2 MG tablet Take 6 mg for 3 days. RN should come to your home to check the INR on Tuesday. Patient taking differently: Take 2-4 mg by mouth See admin instructions. 2 mg in the evening on Sun/Tues/Wed/Thurs/Sat and 4 mg on Mon/Fri 02/09/17   Calvert Cantor, MD    Physical Exam: Vitals:   03/04/17 2200 03/04/17 2215 03/04/17 2245 03/04/17 2300  BP: (!) 111/57 117/62 (!) 109/59 109/67  Pulse: 95 99 94 94  Resp: (!) 28 19 (!) 24 (!) 28  SpO2: 100% 99% 99% 100%  Weight:      Height:          Constitutional: Moderately built and nourished. Vitals:   03/04/17 2200 03/04/17 2215 03/04/17 2245 03/04/17 2300  BP: (!) 111/57 117/62 (!) 109/59 109/67  Pulse: 95 99 94 94  Resp: (!) 28 19 (!) 24 (!) 28  SpO2: 100%  99% 99% 100%  Weight:      Height:       Eyes: Anicteric no pallor. ENMT: No discharge from the ears eyes nose and mouth. Neck: No mass felt. No neck rigidity. Respiratory: No rhonchi or crepitations. Cardiovascular: S1-S2 heard no murmurs  appreciated. Abdomen: Soft nontender bowel sounds present. Musculoskeletal: Bilateral lower extremity edema. Skin: Chronic skin changes of the lower extremity. Decubitus ulcers. Neurologic: Alert awake oriented to time place and person. Moves all extremities. Psychiatric: Appears normal. Normal affect.   Labs on Admission: I have personally reviewed following labs and imaging studies  CBC:  Recent Labs Lab 03/04/17 2030  WBC 16.7*  NEUTROABS 10.3*  HGB 11.2*  HCT 35.4*  MCV 100.6*  PLT 308   Basic Metabolic Panel:  Recent Labs Lab 03/04/17 2030  NA 134*  K 2.3*  CL 85*  CO2 37*  GLUCOSE 98  BUN 11  CREATININE 1.05*  CALCIUM 8.5*  MG 1.6*   GFR: Estimated Creatinine Clearance: 42.5 mL/min (A) (by C-G formula based on SCr of 1.05 mg/dL (H)). Liver Function Tests:  Recent Labs Lab 03/04/17 2030  AST 21  ALT 15  ALKPHOS 93  BILITOT 1.2  PROT 6.3*  ALBUMIN 2.3*   No results for input(s): LIPASE, AMYLASE in the last 168 hours. No results for input(s): AMMONIA in the last 168 hours. Coagulation Profile:  Recent Labs Lab 02/27/17 03/04/17  INR 1.8 4.1   Cardiac Enzymes:  Recent Labs Lab 03/04/17 2030  TROPONINI <0.03   BNP (last 3 results) No results for input(s): PROBNP in the last 8760 hours. HbA1C: No results for input(s): HGBA1C in the last 72 hours. CBG: No results for input(s): GLUCAP in the last 168 hours. Lipid Profile: No results for input(s): CHOL, HDL, LDLCALC, TRIG, CHOLHDL, LDLDIRECT in the last 72 hours. Thyroid Function Tests: No results for input(s): TSH, T4TOTAL, FREET4, T3FREE, THYROIDAB in the last 72 hours. Anemia Panel: No results for input(s): VITAMINB12, FOLATE, FERRITIN, TIBC,  IRON, RETICCTPCT in the last 72 hours. Urine analysis:    Component Value Date/Time   COLORURINE YELLOW 03/04/2017 2015   APPEARANCEUR CLEAR 03/04/2017 2015   APPEARANCEUR Hazy 05/25/2013 1613   LABSPEC 1.005 03/04/2017 2015   LABSPEC 1.009 05/25/2013 1613   PHURINE 7.0 03/04/2017 2015   GLUCOSEU NEGATIVE 03/04/2017 2015   GLUCOSEU Negative 05/25/2013 1613   HGBUR NEGATIVE 03/04/2017 2015   BILIRUBINUR NEGATIVE 03/04/2017 2015   BILIRUBINUR Negative 05/25/2013 1613   KETONESUR 5 (A) 03/04/2017 2015   PROTEINUR NEGATIVE 03/04/2017 2015   UROBILINOGEN 1.0 08/12/2011 2028   NITRITE NEGATIVE 03/04/2017 2015   LEUKOCYTESUR TRACE (A) 03/04/2017 2015   LEUKOCYTESUR 1+ 05/25/2013 1613   Sepsis Labs: (procalcitonin:4,lacticidven:4) )No results found for this or any previous visit (from the past 240 hour(s)).   Radiological Exams on Admission: No results found.  EKG: Independently reviewed. Normal sinus rhythm with APCs and nonspecific ST-T changes.  Assessment/Plan Principal Problem:   Near syncope Active Problems:   Hypokalemia   DM2 (diabetes mellitus, type 2) (HCC)   CAD (coronary artery disease)   Saddle embolus of pulmonary artery (HCC)   Primary ciliary dyskinesia   Rheumatoid arthritis (HCC)   Decubital ulcer   CKD (chronic kidney disease) stage 3, GFR 30-59 ml/min (HCC)   SVT (supraventricular tachycardia) (HCC)   Kartagener syndrome   Closed compression fracture of L1 lumbar vertebra (HCC)    1. Near syncope - Given the history of palpitations and tachycardia suspect patient's symptoms are from SVT. We'll monitor in telemetry and continue metoprolol. Hold Lasix for now. Will check TSH and troponin. 2.  hypokalemia likely from poor oral intake and Lasix without potassium replacement - replace potassium and recheck metabolic panel and check magnesium levels. Hold Lasix for now. 3.  Nausea and poor appetite - if patient continues to remain nauseated I will get  a CT abdomen and pelvis with by mouth contrast to further assess. 4. Recent kyphoplasty will keep patient on when necessary pain relieving medications. 5. History of rheumatoid arthritis on methotrexate weekly. 6. Diabetes mellitus type 2 will keep patient on sliding scale coverage. 7. History of CAD status post CABG - denies any chest pain. 8. History of saddle embolus on Coumadin. Coumadin per pharmacy. 9. History of Kartagener syndrome.  Since patient was complaining of headache I have ordered CT head. If patient continues to remain stable will get physical therapy consult in the morning. Has history of sacral decubitus ulcer which I was not able to clearly assess at this time.   DVT prophylaxis: Coumadin. Code Status: Full code.  Family Communication: No family at the bedside.  Disposition Plan: To be determined.  Consults called: None.  Admission status: Observation.    Eduard Clos MD [facility] Pager [phone].  If 7PM-7AM, please contact night-coverage [email or web address] Password [email or web address]  03/05/2017, 12:13 AM

## 2017-03-05 NOTE — Progress Notes (Signed)
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 Penicillins Rash    Has patient had a PCN reaction causing immediate rash, facial/tongue/throat swelling, SOB or lightheadedness with hypotension: Yes Has patient had a PCN reaction causing severe rash involving mucus membranes or skin necrosis: No Has patient had a PCN reaction that required hospitalization: No Has patient had a PCN reaction occurring within the last 10 years: No If all of the above answers are "NO", then may proceed with Cephalosporin use.   . Shellfish Allergy Anaphylaxis and Rash  . Hydrocodone Other (See Comments)    Pt stated "it's too high-powered, so it sends me to outer space"  . Oxycodone Other (See Comments)    Pt stated "it's too high-powered, so it sends me to outer space"  . Levaquin [Levofloxacin] Hives    Patient Measurements: Height: 5\' 2"  (157.5 cm) Weight: 150 lb (68 kg) IBW/kg (Calculated) : 50.1  Vital Signs: Temp: 98.2 F (36.8 C) (10/02 1000) Temp Source: Oral (10/02 1000) BP: 115/57 (10/02 1000) Pulse Rate: 88 (10/02 1000)  Labs:  Recent Labs  03/04/17 03/04/17 2030 03/05/17 0538 03/05/17 0815 03/05/17 0853 03/05/17 1200  HGB  --  11.2*  --  10.2*  --   --   HCT  --  35.4*  --  32.1*  --   --   PLT  --  308  --  213  --   --   LABPROT  --   --  28.2*  --   --  33.4*  INR 4.1  --  SPECIMEN CLOTTED  --   --  3.32  CREATININE  --  1.05* 0.96  --   --   --   TROPONINI  --  <0.03  --   --  <0.03  --     Estimated Creatinine Clearance: 46.5 mL/min (by C-G formula based on SCr of 0.96 mg/dL).   Medical History: Past Medical History:  Diagnosis Date  . Allergy   . Anemia   . Arthritis   . Blood transfusion without reported diagnosis   . CAD (coronary artery disease)    NSTEMI in setting of gallstone pancreatitis 05/2011:  LHC with 3v CAD; CABG was performed  06/14/11: RIMA-LAD, SVG-ramus, SVG-RCA  . Carotid stenosis    Dopplers 06/12/11: LICA 60-79%.  . Cataract   . CHF (congestive heart failure) (HCC)   . Chronic back pain   . Chronic bronchitis   . Chronic kidney disease   . Chronic systolic heart failure (HCC)   . Collagen vascular disease (HCC)   . Dextrocardia   . Diabetes mellitus without complication (HCC)   . DVT of leg (deep venous thrombosis) (HCC) ~2006   left  . Fracture 05/24/2011   right; "did not have surgery"  . GERD (gastroesophageal reflux disease)   . H/O hiatal hernia   . HLD (hyperlipidemia)   . Hypertension   . Ischemic cardiomyopathy    Echocardiogram 06/05/11: EF 40-45%, anteroseptal hypokinesis, apical hypokinesis, mild LAE  . Myocardial infarction (HCC)   . Neuromuscular disorder (HCC)   . Neuropathy   . Osteoporosis   . Oxygen deficiency    2l  . Pneumonia 06/13/11   "couple times; long time ago"  . Pulmonary embolus [facility]) March 2013  . Rheumatoid arthritis (HCC)   . Situs inversus with dextrocardia    CT 06/2011: situs inversus totalis.  What would typically be called RCA arose from anterior sinus of Valsalva.  What would typically be called the left main arises from the posterior sinus of Valsalva and gives rise to a large first diagonal branch and diminutive circumflex    Medications:  No current facility-administered medications on file prior to encounter.    Current Outpatient Prescriptions on File Prior to Encounter  Medication Sig Dispense Refill  . acetaminophen (TYLENOL) 160 MG/5ML elixir Take 320 mg by mouth every 6 (six) hours as needed for pain.     Marland Kitchen alendronate (FOSAMAX) 70 MG tablet Take 70 mg by mouth every Sunday.     Marland Kitchen allopurinol (ZYLOPRIM) 100 MG tablet Take 100 mg by mouth 2 (two) times daily.     Marland Kitchen aspirin EC 81 MG tablet Take 81 mg by mouth every morning.     . calcium carbonate (TUMS) 500 MG chewable tablet Chew 1 tablet (200 mg of elemental calcium total) by mouth daily. 90 tablet 3   . cholecalciferol (VITAMIN D) 1000 UNITS tablet Take 1,000 Units by mouth every morning.     . collagenase (SANTYL) ointment Apply topically daily. (Patient taking differently: Apply 1 application topically daily. TO AFFECTED AREA) 15 g 0  . folic acid (FOLVITE) 800 MCG tablet Take 800 mcg by mouth every morning.     . furosemide (LASIX) 40 MG tablet Take 1 tablet (40 mg total) by mouth daily as needed for fluid or edema. Take only if you gain 2-3 lb of fluid in a 24 hr period (Patient taking differently: Take 40 mg by mouth every morning. ) 90 tablet 3  . gabapentin (NEURONTIN) 300 MG capsule Take 300 mg by mouth 2 (two) times daily.      Marland Kitchen guaiFENesin (MUCINEX) 600 MG 12 hr tablet Take 1 tablet (600 mg total) by mouth 2 (two) times daily. (Patient taking differently: Take 600 mg by mouth 2 (two) times daily as needed for cough or to loosen phlegm. ) 60 tablet 2  . guaiFENesin (ROBITUSSIN) 100 MG/5ML SOLN Take 10 mLs (200 mg total) by mouth 3 (three) times daily. (Patient taking differently: Take 10 mLs by mouth 2 (two) times daily. ) 1200 mL 0  . metFORMIN (GLUCOPHAGE-XR) 500 MG 24 hr tablet Take 500 mg by mouth 2 (two) times a week.     . methotrexate (RHEUMATREX) 2.5 MG tablet Take 20 mg by mouth every Monday.     . metoprolol tartrate (LOPRESSOR) 25 MG tablet Take 25 mg by mouth 2 (two) times daily.    Marland Kitchen omeprazole (PRILOSEC) 20 MG capsule Take 20 mg by mouth every morning.      Marland Kitchen Respiratory Therapy Supplies (FLUTTER) DEVI Use 10-15 times daily 1 each 0  . simvastatin (ZOCOR) 20 MG tablet Take 20 mg by mouth every evening.     . sodium chloride HYPERTONIC 3 % nebulizer solution Take 4 mLs by nebulization 2 (two) times daily. (Patient taking differently: Take 4 mLs by nebulization See admin instructions. ONE TO TWO TIMES A DAY) 750 mL 12  . enoxaparin (LOVENOX) 100 MG/ML injection Inject 1 mL (100 mg total) into the skin daily. (Patient not taking: Reported on 02/27/2017) 10 Syringe 0  .  HYDROmorphone (DILAUDID) 2 MG tablet Take 0.5 tablets (1 mg total) by mouth every 4 (four) hours as needed for severe pain. (Patient not taking: Reported on 03/04/2017) 30 tablet 0  . ipratropium-albuterol (DUONEB) 0.5-2.5 (3) MG/3ML SOLN Take 3 mLs by nebulization every 6 (six)  hours as needed. (Patient taking differently: Take 3 mLs by nebulization every 6 (six) hours as needed (for shortness of breath or wheezing). ) 360 mL 5  . polyethylene glycol (MIRALAX / GLYCOLAX) packet Take 17 g by mouth daily as needed for mild constipation. (Patient not taking: Reported on 03/04/2017) 14 each 0  . warfarin (COUMADIN) 2 MG tablet Take 6 mg for 3 days. RN should come to your home to check the INR on Tuesday. (Patient taking differently: Take 2-4 mg by mouth See admin instructions. 2 mg in the evening on Sun/Tues/Wed/Thurs/Sat and 4 mg on Mon/Fri) 65 tablet 3     Assessment: 75 y.o. female admitted 03/04/17 with weakness, h/o saddle PE (08/2011) and LLE DVT (~ 2006) and supratherapeutic INR of 4.1 on admission,  to continue Coumadin.   Today the INR has decreased to 3.32, slightly above goal after holding dose yesterday.  H/H slight decrease, low and pltc wnl 308>213K.  No bleeding reported.   PTA on Coumadin 4mg  on 9/26, 3mg  on 9/27 then on 9/28 (Fri) continue 2mg  daily except 4mg  qMon/Fri. ( so took 4mg  -3mg -4mg -2mg -2mg ) last taken 9/30    Outpatient Anticoag visits:  02/27/17: INR = 1.8:  On coumadin 2mg  daily except 4mg  qMon/Fri. + Lovenox SQ (last took lovenox on 9/24). S/p recent hospital admission, had kyphoplasty done on 02/18/17.  Took coumadin 4mg  on 9/18th, 19th and 20th then 2mg  daily except 4mg  qMon/Fri.   ---RPh instructed pt on 9/26 to change dose to 4mg  on 9/26, 3mg  on 9/27 then continue 2mg  daily except 4mg  qMon/Fri.   03/04/17: INR = 4.1: on Coumadin 4mg  on 9/26, 3mg  on 9/27 then on 9/28 (Fri) continue 2mg  daily except 4mg  qMon/Fri. ( so 4mg  -3mg -4mg -2mg -2mg ) prior to 10/1  admission.   Goal of Therapy:  INR 2-3 Monitor platelets by anticoagulation protocol: Yes   Plan:  Coumadin 2 mg tonight Daily INR  , RPh Clinical Pharmacist Pager: [phone]-330p [phone] 330p-1030p phone [phone] or [phone] Main pharmacy [phone]  03/05/2017,2:18 PM

## 2017-03-05 NOTE — Progress Notes (Addendum)
Patient was seen and examined at bedside. Patient was admitted after midnight. Please see H&P for detail. I have discussed with the patient's daughter at bedside. Chart reviewed  76 year old female with history of SVT, CAD status post CABG, PE, situs inversus totalis, gout, recently admitted for L1 compression fracture status post kyphoplasty presented from home with the patient of fainting associated with the palpitation and generalized weakness. Patient also with decreased oral intake and gradually declining in functional status since his recent hospital discharge. In the ER patient was found to have severe hypokalemia and dehydration. Electrolyte repleted. Admitted for further evaluation.  Patient denies passing out. No headache, shortness of breath, chest pain, abdominal pain or diarrhea. Has nausea but no vomiting. Decreased oral intake. CT scan of abdomen and pelvis was ordered on admission.  -TSH acceptable -Troponin not elevated -Serum potassium level 3.7 today. Add potassium chloride oral. Continue IV fluid. Her magnesium level acceptable. Monitor labs -UA negative for UTI, does not have respiratory symptoms -check Orthostatic BP -Dietary consult, PT OT evaluation, care management referral for discharge planning. -On Coumadin for PE. Monitor INR.  DVT prophylaxis: On Coumadin.  Extensively discussed with the patient's daughter and daughter-in-law at bedside. May need rehabilitation on discharge. Pending further workup and therapy evaluation. Discussed with the care management team during morning progress round.

## 2017-03-05 NOTE — Care Management Note (Signed)
Case Management Note  Patient Details  Name: [name] MRN: [id number] Date of Birth: 24-Jul-1940  Subjective/Objective:   CM following for progression and d/c planning.                  Action/Plan: 03/05/2017 Noted CM consult for [facility] services. Pt reports HHRN and HH aide currently active. Noted [facility] arranged for pt after last hospitalization in September 2018. This CM contacted [facility] rep Adashia who state that [facility] is active with pt and will resume services at the time of D/C. This CM entered resumption orders.   Expected Discharge Date:                 Expected Discharge Plan:  Home w Home Health Services  In-House Referral:  NA  Discharge planning Services  CM Consult  Post Acute Care Choice:  Home Health, Resumption of Svcs/PTA Provider Choice offered to:  Patient  DME Arranged:  N/A DME Agency:  NA  HH Arranged:  RN, PT HH Agency:  [facility]  Status of Service:  Completed, signed off  If discussed at Long Length of Stay Meetings, dates discussed:    Additional Comments:  Starlyn Skeans, RN 03/05/2017, 2:38 PM

## 2017-03-05 NOTE — Progress Notes (Signed)
ANTICOAGULATION CONSULT NOTE - Initial Consult  Pharmacy Consult for Coumadin Indication: h/o DVT  Allergies  Allergen Reactions  . Penicillins Rash    Has patient had a PCN reaction causing immediate rash, facial/tongue/throat swelling, SOB or lightheadedness with hypotension: Yes Has patient had a PCN reaction causing severe rash involving mucus membranes or skin necrosis: No Has patient had a PCN reaction that required hospitalization: No Has patient had a PCN reaction occurring within the last 10 years: No If all of the above answers are "NO", then may proceed with Cephalosporin use.   . Shellfish Allergy Anaphylaxis and Rash  . Hydrocodone Other (See Comments)    Pt stated "it's too high-powered, so it sends me to outer space"  . Oxycodone Other (See Comments)    Pt stated "it's too high-powered, so it sends me to outer space"  . Levaquin [Levofloxacin] Hives    Patient Measurements: Height: 5\' 2"  (157.5 cm) Weight: 150 lb (68 kg) IBW/kg (Calculated) : 50.1  Vital Signs: BP: 109/67 (10/01 2300) Pulse Rate: 94 (10/01 2300)  Labs:  Recent Labs  03/04/17 03/04/17 2030  HGB  --  11.2*  HCT  --  35.4*  PLT  --  308  INR 4.1  --   CREATININE  --  1.05*  TROPONINI  --  <0.03    Estimated Creatinine Clearance: 42.5 mL/min (A) (by C-G formula based on SCr of 1.05 mg/dL (H)).   Medical History: Past Medical History:  Diagnosis Date  . Allergy   . Anemia   . Arthritis   . Blood transfusion without reported diagnosis   . CAD (coronary artery disease)    NSTEMI in setting of gallstone pancreatitis 05/2011:  LHC with 3v CAD; CABG was performed 06/14/11: RIMA-LAD, SVG-ramus, SVG-RCA  . Carotid stenosis    Dopplers 06/12/11: LICA 60-79%.  . Cataract   . CHF (congestive heart failure) (HCC)   . Chronic back pain   . Chronic bronchitis   . Chronic kidney disease   . Chronic systolic heart failure (HCC)   . Collagen vascular disease (HCC)   . Dextrocardia   . Diabetes  mellitus without complication (HCC)   . DVT of leg (deep venous thrombosis) (HCC) ~2006   left  . Fracture 05/24/2011   right; "did not have surgery"  . GERD (gastroesophageal reflux disease)   . H/O hiatal hernia   . HLD (hyperlipidemia)   . Hypertension   . Ischemic cardiomyopathy    Echocardiogram 06/05/11: EF 40-45%, anteroseptal hypokinesis, apical hypokinesis, mild LAE  . Myocardial infarction (HCC)   . Neuromuscular disorder (HCC)   . Neuropathy   . Osteoporosis   . Oxygen deficiency    2l  . Pneumonia 06/13/11   "couple times; long time ago"  . Pulmonary embolus [facility]) March 2013  . Rheumatoid arthritis (HCC)   . Situs inversus with dextrocardia    CT 06/2011: situs inversus totalis.  What would typically be called RCA arose from anterior sinus of Valsalva.  What would typically be called the left main arises from the posterior sinus of Valsalva and gives rise to a large first diagonal branch and diminutive circumflex    Medications:  No current facility-administered medications on file prior to encounter.    Current Outpatient Prescriptions on File Prior to Encounter  Medication Sig Dispense Refill  . acetaminophen (TYLENOL) 160 MG/5ML elixir Take 320 mg by mouth every 6 (six) hours as needed for pain.     07/2011 alendronate (FOSAMAX) 70  MG tablet Take 70 mg by mouth every Sunday.     Marland Kitchen allopurinol (ZYLOPRIM) 100 MG tablet Take 100 mg by mouth 2 (two) times daily.     Marland Kitchen aspirin EC 81 MG tablet Take 81 mg by mouth every morning.     . calcium carbonate (TUMS) 500 MG chewable tablet Chew 1 tablet (200 mg of elemental calcium total) by mouth daily. 90 tablet 3  . cholecalciferol (VITAMIN D) 1000 UNITS tablet Take 1,000 Units by mouth every morning.     . collagenase (SANTYL) ointment Apply topically daily. (Patient taking differently: Apply 1 application topically daily. TO AFFECTED AREA) 15 g 0  . folic acid (FOLVITE) 800 MCG tablet Take 800 mcg by mouth every morning.     .  furosemide (LASIX) 40 MG tablet Take 1 tablet (40 mg total) by mouth daily as needed for fluid or edema. Take only if you gain 2-3 lb of fluid in a 24 hr period (Patient taking differently: Take 40 mg by mouth every morning. ) 90 tablet 3  . gabapentin (NEURONTIN) 300 MG capsule Take 300 mg by mouth 2 (two) times daily.      Marland Kitchen guaiFENesin (MUCINEX) 600 MG 12 hr tablet Take 1 tablet (600 mg total) by mouth 2 (two) times daily. (Patient taking differently: Take 600 mg by mouth 2 (two) times daily as needed for cough or to loosen phlegm. ) 60 tablet 2  . guaiFENesin (ROBITUSSIN) 100 MG/5ML SOLN Take 10 mLs (200 mg total) by mouth 3 (three) times daily. (Patient taking differently: Take 10 mLs by mouth 2 (two) times daily. ) 1200 mL 0  . metFORMIN (GLUCOPHAGE-XR) 500 MG 24 hr tablet Take 500 mg by mouth 2 (two) times a week.     . methotrexate (RHEUMATREX) 2.5 MG tablet Take 20 mg by mouth every Monday.     . metoprolol tartrate (LOPRESSOR) 25 MG tablet Take 25 mg by mouth 2 (two) times daily.    Marland Kitchen omeprazole (PRILOSEC) 20 MG capsule Take 20 mg by mouth every morning.      Marland Kitchen Respiratory Therapy Supplies (FLUTTER) DEVI Use 10-15 times daily 1 each 0  . simvastatin (ZOCOR) 20 MG tablet Take 20 mg by mouth every evening.     . sodium chloride HYPERTONIC 3 % nebulizer solution Take 4 mLs by nebulization 2 (two) times daily. (Patient taking differently: Take 4 mLs by nebulization See admin instructions. ONE TO TWO TIMES A DAY) 750 mL 12  . enoxaparin (LOVENOX) 100 MG/ML injection Inject 1 mL (100 mg total) into the skin daily. (Patient not taking: Reported on 02/27/2017) 10 Syringe 0  . HYDROmorphone (DILAUDID) 2 MG tablet Take 0.5 tablets (1 mg total) by mouth every 4 (four) hours as needed for severe pain. (Patient not taking: Reported on 03/04/2017) 30 tablet 0  . ipratropium-albuterol (DUONEB) 0.5-2.5 (3) MG/3ML SOLN Take 3 mLs by nebulization every 6 (six) hours as needed. (Patient taking differently: Take 3  mLs by nebulization every 6 (six) hours as needed (for shortness of breath or wheezing). ) 360 mL 5  . polyethylene glycol (MIRALAX / GLYCOLAX) packet Take 17 g by mouth daily as needed for mild constipation. (Patient not taking: Reported on 03/04/2017) 14 each 0  . warfarin (COUMADIN) 2 MG tablet Take 6 mg for 3 days. RN should come to your home to check the INR on Tuesday. (Patient taking differently: Take 2-4 mg by mouth See admin instructions. 2 mg in the evening on Sun/Tues/Wed/Thurs/Sat  and 4 mg on Mon/Fri) 65 tablet 3     Assessment: 76 y.o. female admitted with weakness, h/o DVT and supratherapeutic INR to continue Coumadin Goal of Therapy:  INR 2-3 Monitor platelets by anticoagulation protocol: Yes   Plan:  No Coumadin tonight Daily INR  Deshan Hemmelgarn, Gary Fleet 03/05/2017,12:26 AM

## 2017-03-06 DIAGNOSIS — J9621 Acute and chronic respiratory failure with hypoxia: Secondary | ICD-10-CM | POA: Diagnosis present

## 2017-03-06 DIAGNOSIS — R55 Syncope and collapse: Secondary | ICD-10-CM

## 2017-03-06 DIAGNOSIS — J9811 Atelectasis: Secondary | ICD-10-CM | POA: Diagnosis present

## 2017-03-06 DIAGNOSIS — K219 Gastro-esophageal reflux disease without esophagitis: Secondary | ICD-10-CM | POA: Diagnosis present

## 2017-03-06 DIAGNOSIS — M069 Rheumatoid arthritis, unspecified: Secondary | ICD-10-CM

## 2017-03-06 DIAGNOSIS — J984 Other disorders of lung: Secondary | ICD-10-CM | POA: Diagnosis not present

## 2017-03-06 DIAGNOSIS — N183 Chronic kidney disease, stage 3 (moderate): Secondary | ICD-10-CM

## 2017-03-06 DIAGNOSIS — I255 Ischemic cardiomyopathy: Secondary | ICD-10-CM | POA: Diagnosis present

## 2017-03-06 DIAGNOSIS — I471 Supraventricular tachycardia: Secondary | ICD-10-CM

## 2017-03-06 DIAGNOSIS — E876 Hypokalemia: Secondary | ICD-10-CM | POA: Diagnosis present

## 2017-03-06 DIAGNOSIS — R918 Other nonspecific abnormal finding of lung field: Secondary | ICD-10-CM

## 2017-03-06 DIAGNOSIS — F329 Major depressive disorder, single episode, unspecified: Secondary | ICD-10-CM | POA: Diagnosis present

## 2017-03-06 DIAGNOSIS — E1122 Type 2 diabetes mellitus with diabetic chronic kidney disease: Secondary | ICD-10-CM | POA: Diagnosis present

## 2017-03-06 DIAGNOSIS — J479 Bronchiectasis, uncomplicated: Secondary | ICD-10-CM

## 2017-03-06 DIAGNOSIS — Q893 Situs inversus: Secondary | ICD-10-CM

## 2017-03-06 DIAGNOSIS — S32010A Wedge compression fracture of first lumbar vertebra, initial encounter for closed fracture: Secondary | ICD-10-CM | POA: Diagnosis not present

## 2017-03-06 DIAGNOSIS — I252 Old myocardial infarction: Secondary | ICD-10-CM | POA: Diagnosis not present

## 2017-03-06 DIAGNOSIS — I130 Hypertensive heart and chronic kidney disease with heart failure and stage 1 through stage 4 chronic kidney disease, or unspecified chronic kidney disease: Secondary | ICD-10-CM | POA: Diagnosis present

## 2017-03-06 DIAGNOSIS — R0902 Hypoxemia: Secondary | ICD-10-CM | POA: Diagnosis not present

## 2017-03-06 DIAGNOSIS — I2510 Atherosclerotic heart disease of native coronary artery without angina pectoris: Secondary | ICD-10-CM | POA: Diagnosis present

## 2017-03-06 DIAGNOSIS — E1169 Type 2 diabetes mellitus with other specified complication: Secondary | ICD-10-CM | POA: Diagnosis not present

## 2017-03-06 DIAGNOSIS — E860 Dehydration: Secondary | ICD-10-CM | POA: Diagnosis present

## 2017-03-06 DIAGNOSIS — M109 Gout, unspecified: Secondary | ICD-10-CM | POA: Diagnosis present

## 2017-03-06 DIAGNOSIS — I5022 Chronic systolic (congestive) heart failure: Secondary | ICD-10-CM | POA: Diagnosis present

## 2017-03-06 DIAGNOSIS — M1991 Primary osteoarthritis, unspecified site: Secondary | ICD-10-CM | POA: Diagnosis present

## 2017-03-06 DIAGNOSIS — E785 Hyperlipidemia, unspecified: Secondary | ICD-10-CM | POA: Diagnosis present

## 2017-03-06 DIAGNOSIS — M810 Age-related osteoporosis without current pathological fracture: Secondary | ICD-10-CM | POA: Diagnosis present

## 2017-03-06 DIAGNOSIS — G249 Dystonia, unspecified: Secondary | ICD-10-CM | POA: Diagnosis present

## 2017-03-06 DIAGNOSIS — E440 Moderate protein-calorie malnutrition: Secondary | ICD-10-CM | POA: Insufficient documentation

## 2017-03-06 LAB — BASIC METABOLIC PANEL
ANION GAP: 6 (ref 5–15)
BUN: 7 mg/dL (ref 6–20)
CALCIUM: 7.9 mg/dL — AB (ref 8.9–10.3)
CHLORIDE: 97 mmol/L — AB (ref 101–111)
CO2: 34 mmol/L — AB (ref 22–32)
Creatinine, Ser: 0.81 mg/dL (ref 0.44–1.00)
GFR calc non Af Amer: >60 mL/min (ref >60)
Glucose, Bld: 86 mg/dL (ref 65–99)
POTASSIUM: 3.5 mmol/L (ref 3.5–5.1)
Sodium: 137 mmol/L (ref 135–145)

## 2017-03-06 LAB — CBC
HEMATOCRIT: 29 % — AB (ref 36.0–46.0)
HEMOGLOBIN: 9 g/dL — AB (ref 12.0–15.0)
MCH: 32 pg (ref 26.0–34.0)
MCHC: 31 g/dL (ref 30.0–36.0)
MCV: 103.2 fL — ABNORMAL HIGH (ref 78.0–100.0)
Platelets: 263 10*3/uL (ref 150–400)
RBC: 2.81 MIL/uL — ABNORMAL LOW (ref 3.87–5.11)
RDW: 16.8 % — AB (ref 11.5–15.5)
WBC: 8.4 10*3/uL (ref 4.0–10.5)

## 2017-03-06 LAB — GLUCOSE, CAPILLARY
GLUCOSE-CAPILLARY: 81 mg/dL (ref 65–99)
GLUCOSE-CAPILLARY: 95 mg/dL (ref 65–99)
GLUCOSE-CAPILLARY: 145 mg/dL — AB (ref 65–99)
Glucose-Capillary: 138 mg/dL — ABNORMAL HIGH (ref 65–99)

## 2017-03-06 LAB — PROTIME-INR
INR: 2.71
Prothrombin Time: 28.5 seconds — ABNORMAL HIGH (ref 11.4–15.2)

## 2017-03-06 MED ORDER — ENSURE ENLIVE PO LIQD
237.0000 mL | Freq: Two times a day (BID) | ORAL | Status: DC
  Administered 2017-03-06 – 2017-03-07 (×2): 237 mL via ORAL

## 2017-03-06 MED ORDER — SODIUM CHLORIDE 0.9 % IV SOLN
INTRAVENOUS | Status: DC
  Administered 2017-03-06: 15:00:00 via INTRAVENOUS

## 2017-03-06 MED ORDER — WARFARIN SODIUM 2 MG PO TABS
2.0000 mg | ORAL_TABLET | Freq: Once | ORAL | Status: AC
  Administered 2017-03-06: 2 mg via ORAL
  Filled 2017-03-06: qty 1

## 2017-03-06 MED ORDER — PANTOPRAZOLE SODIUM 40 MG PO TBEC
40.0000 mg | DELAYED_RELEASE_TABLET | Freq: Two times a day (BID) | ORAL | Status: DC
  Administered 2017-03-06 – 2017-03-07 (×2): 40 mg via ORAL
  Filled 2017-03-06: qty 1

## 2017-03-06 NOTE — Progress Notes (Addendum)
PROGRESS NOTE    Karren Newland Butrick   DGL:[id number]  DOB: 02/18/1941  DOA: 03/04/2017 PCP: Elias Else, MD   Brief Narrative:  Marisa Sprinkles Leppert SVT, CAD status post CABG, PE, situs inversus totalis, gout, recently admitted for L1 compression fracture status post kyphoplasty presented from home with the patient of fainting associated with the palpitation and generalized weakness. Patient also with decreased oral intake and gradually declining in functional status since his recent hospital discharge. In the ER patient was found to have severe hypokalemia and dehydration. Electrolyte repleted. Admitted for further evaluation.   Subjective: Poor oral intake. Quite weak. Has cough but no sputum production.  ROS: no complaints of nausea, vomiting, constipation diarrhea,     Assessment & Plan:   Principal Problem:   Near syncope - dehydration, poor oral intake- cont IV hydration and follow oral intake  Active Problems: Poor oral intake/   Malnutrition of moderate degree - CT abd/pelvis reviewed- no GI issues noted - admits to heart burn, reflux and is tender in epigastrium - take Prilosec 20 mg daily- may have gastritis/GERD - will increase Prilosec to 40 daily when discharged - currently will give Protonix 40 BID  - cont Ensure - normal TSH  Abnormal CT chest, h/o Kartanger's syndrome/ Primary ciliary dyskinesia Acute on chronic hypoxic resp failure - follows with Dr Sung Amabile - ? MAI with LLL consolidation- with symptoms of poor oral intake, may have an indolent infection - cough is chronic but now needing more O2 than baseline- pulse ox is 89% on 3 L - usually uses 2 L -cont Mucinex, Guaifenesin, Hypertonic nebs, Duonebs - have asked for pulm eval- was evaluated last hospital stay as well- will see if anything further needs to be done    SVT (supraventricular tachycardia)   - cont Metoprolol and follow    Closed compression fracture of L1 lumbar vertebra  - s/p augmentation last  month - pain has improved    Hypokalemia - K borderline- cont K replacement and recheck tomorrow    DM2 (diabetes mellitus, type 2)  - SSI  - Metformin on hold     Saddle embolus of pulmonary artery  Coumadin per pharmacy- INR  therapeutic    CKD (chronic kidney disease) stage 3, GFR 30-59 ml/min (HCC) - stable  Rheumatoid arthritis  - Methotrexate  Gout - Allopurinol  DVT prophylaxis: Coumadin Code Status: Full code Family Communication:  Disposition Plan: f/u pulm eval, f/u pulse ox on ambulation and oral intake Consultants:   pulm Procedures:    Antimicrobials:  Anti-infectives    None       Objective: Vitals:   03/05/17 2121 03/06/17 0557 03/06/17 0736 03/06/17 1000  BP:  (!) 114/57  111/64  Pulse:  87  (!) 101  Resp:  12  16  Temp:  99 F (37.2 C)  98.2 F (36.8 C)  TempSrc:    Oral  SpO2:  99% 98% 98%  Weight: 68 kg (149 lb 14.6 oz)     Height:        Intake/Output Summary (Last 24 hours) at 03/06/17 1420 Last data filed at 03/06/17 0900  Gross per 24 hour  Intake            997.5 ml  Output             2000 ml  Net          -1002.5 ml   Filed Weights   03/04/17 1753 03/05/17 0300 03/05/17 2121  Weight: 68 kg (150 lb) 68 kg (150 lb) 68 kg (149 lb 14.6 oz)    Examination: General exam: Appears comfortable  HEENT: PERRLA, oral mucosa moist, no sclera icterus or thrush Respiratory system: crackles and rhonchi at bilateral bases Cardiovascular system: S1 & S2 heard, RRR.  No murmurs  Gastrointestinal system: Abdomen soft, non-tender, nondistended. Normal bowel sound. No organomegaly Central nervous system: Alert and oriented. No focal neurological deficits. Extremities: No cyanosis, clubbing or edema Skin: No rashes or ulcers Psychiatry:  Mood & affect appropriate.     Data Reviewed: I have personally reviewed following labs and imaging studies  CBC:  Recent Labs Lab 03/04/17 2030 03/05/17 0815 03/06/17 0337  WBC 16.7* 9.4 8.4    NEUTROABS 10.3*  --   --   HGB 11.2* 10.2* 9.0*  HCT 35.4* 32.1* 29.0*  MCV 100.6* 102.2* 103.2*  PLT 308 213 263   Basic Metabolic Panel:  Recent Labs Lab 03/04/17 2030 03/05/17 0538 03/06/17 0337  NA 134* 136 137  K 2.3* 3.7 3.5  CL 85* 93* 97*  CO2 37* 36* 34*  GLUCOSE 98 75 86  BUN 11 11 7   CREATININE 1.05* 0.96 0.81  CALCIUM 8.5* 7.9* 7.9*  MG 1.6* 2.2  --    GFR: Estimated Creatinine Clearance: 55.1 mL/min (by C-G formula based on SCr of 0.81 mg/dL). Liver Function Tests:  Recent Labs Lab 03/04/17 2030 03/05/17 0538  AST 21 27  ALT 15 11*  ALKPHOS 93 75  BILITOT 1.2 1.7*  PROT 6.3* 5.2*  ALBUMIN 2.3* 2.1*   No results for input(s): LIPASE, AMYLASE in the last 168 hours. No results for input(s): AMMONIA in the last 168 hours. Coagulation Profile:  Recent Labs Lab 03/04/17 03/05/17 0538 03/05/17 1200 03/06/17 0337  INR 4.1 SPECIMEN CLOTTED 3.32 2.71   Cardiac Enzymes:  Recent Labs Lab 03/04/17 2030 03/05/17 0853  TROPONINI <0.03 <0.03   BNP (last 3 results) No results for input(s): PROBNP in the last 8760 hours. HbA1C: No results for input(s): HGBA1C in the last 72 hours. CBG:  Recent Labs Lab 03/05/17 1141 03/05/17 1632 03/05/17 2220 03/06/17 0737 03/06/17 1137  GLUCAP 87 90 95 81 95   Lipid Profile: No results for input(s): CHOL, HDL, LDLCALC, TRIG, CHOLHDL, LDLDIRECT in the last 72 hours. Thyroid Function Tests:  Recent Labs  03/05/17 0853  TSH 3.140   Anemia Panel: No results for input(s): VITAMINB12, FOLATE, FERRITIN, TIBC, IRON, RETICCTPCT in the last 72 hours. Urine analysis:    Component Value Date/Time   COLORURINE YELLOW 03/04/2017 2015   APPEARANCEUR CLEAR 03/04/2017 2015   APPEARANCEUR Hazy 05/25/2013 1613   LABSPEC 1.005 03/04/2017 2015   LABSPEC 1.009 05/25/2013 1613   PHURINE 7.0 03/04/2017 2015   GLUCOSEU NEGATIVE 03/04/2017 2015   GLUCOSEU Negative 05/25/2013 1613   HGBUR NEGATIVE 03/04/2017 2015    BILIRUBINUR NEGATIVE 03/04/2017 2015   BILIRUBINUR Negative 05/25/2013 1613   KETONESUR 5 (A) 03/04/2017 2015   PROTEINUR NEGATIVE 03/04/2017 2015   UROBILINOGEN 1.0 08/12/2011 2028   NITRITE NEGATIVE 03/04/2017 2015   LEUKOCYTESUR TRACE (A) 03/04/2017 2015   LEUKOCYTESUR 1+ 05/25/2013 1613   Sepsis Labs: @LABRCNTIP (procalcitonin:4,lacticidven:4) )No results found for this or any previous visit (from the past 240 hour(s)).       Radiology Studies: Ct Abdomen Pelvis Wo Contrast  Result Date: 03/05/2017 CLINICAL DATA:  76 year old female with history of nausea and non bilious vomiting. EXAM: CT ABDOMEN AND PELVIS WITHOUT CONTRAST TECHNIQUE: Multidetector CT imaging of  the abdomen and pelvis was performed following the standard protocol without IV contrast. COMPARISON:  CT the abdomen and pelvis 12/30/2016. 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 Lower chest: Widespread peribronchovascular micronodularity noted throughout the lung bases, increased compared to the prior study. There is some associated cylindrical bronchiectasis, most evident in the left lower lobe where there is also profound thickening of the peribronchovascular interstitium. Some airspace consolidation is also noted in the left lower lobe, concerning for pneumonia. Small left and trace right pleural effusions lying dependently. Dextrocardia. Status post median sternotomy for CABG. Hepatobiliary: Left-sided liver. No definite cystic or solid hepatic lesions are confidently identified on today's noncontrast CT examination. Small amount of pneumobilia, presumably related to prior sphincterotomy, similar to the prior study. Status post cholecystectomy. Pancreas: No definite pancreatic mass or peripancreatic inflammatory changes are noted on today's noncontrast CT examination. Spleen: Right-sided spleen.  Otherwise, unremarkable. Adrenals/Urinary Tract: Unenhanced appearance of the kidneys and bilateral adrenal glands is  normal. No hydroureteronephrosis. Urinary bladder is unremarkable in appearance. Stomach/Bowel: Right-sided stomach. Appearance of the stomach is otherwise unremarkable. No pathologic dilatation of small bowel or colon. Status post appendectomy. Vascular/Lymphatic: Aortic atherosclerosis without definite aneurysm in the abdominal or pelvic vasculature. IVC filter in position with tip terminating shortly before the level of the renal veins. No lymphadenopathy noted in the abdomen or pelvis. Reproductive: Status posthysterectomy. Ovaries are not confidently identified may be surgically absent or atrophic. Other: No significant volume of ascites.  No pneumoperitoneum. Musculoskeletal: There are no aggressive appearing lytic or blastic lesions noted in the visualized portions of the skeleton. Chronic compression fracture of T12 again noted with 50% loss of anterior vertebral body height. New post vertebroplasty changes are noted at T12. There is also a new compression fracture at L1 with post vertebroplasty changes an approximately 50% loss of anterior vertebral body height. Median sternotomy wires. IMPRESSION: 1. No acute findings are noted in the abdomen or pelvis to account for the patient's symptoms. 2. 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 3. Trace bilateral pleural effusions. 4. Aortic atherosclerosis. 5. Situs inversus totalis 6. Additional incidental findings, as above. Aortic Atherosclerosis (ICD10-I70.0). Electronically Signed   By: Trudie Reed M.D.   On: 03/05/2017 21:57   Ct Head Wo Contrast  Result Date: 03/05/2017 CLINICAL DATA:  Dizziness and presyncope. Status post kyphoplasties February 18, 2017. History of hypertension, hyperlipidemia, diabetes,  neuromuscular disorder. EXAM: CT HEAD WITHOUT CONTRAST TECHNIQUE: Contiguous axial images were obtained from the base of the skull through the vertex without intravenous contrast. COMPARISON:  None. FINDINGS: BRAIN: No intraparenchymal hemorrhage, mass effect nor midline shift. The ventricles and sulci are normal for age. Patchy supratentorial white matter hypodensities less than expected for patient's age, though non-specific are most compatible with chronic small vessel ischemic disease. 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 No acute large vascular territory infarcts. No abnormal extra-axial fluid collections. Basal cisterns are patent. VASCULAR: Moderate calcific atherosclerosis of the carotid siphons. SKULL: No skull fracture. Osteopenia. No significant scalp soft tissue swelling. SINUSES/ORBITS: Severe pan paranasal sinusitis. Bilateral middle ear and mastoid effusions without air cell coalescence. The included ocular globes and orbital contents are non-suspicious. Status post bilateral ocular lens implants. OTHER: None. IMPRESSION: 1. 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 2. Severe  chronic pan paranasal sinusitis. Bilateral middle ear and mastoid effusions. Electronically Signed   By: Awilda Metro M.D.   On: 03/05/2017 00:27      Scheduled Meds: . allopurinol  100 mg Oral BID  . aspirin EC  81 mg Oral Daily  . cholecalciferol  1,000 Units Oral Daily  . feeding supplement (ENSURE ENLIVE)  237 mL Oral BID BM  . folic acid  1 mg Oral Daily  . gabapentin  300 mg Oral BID  . guaiFENesin  10 mL Oral BID  . insulin aspart  0-9 Units Subcutaneous TID WC  . methotrexate  20 mg Oral Q Mon  . metoprolol tartrate  25 mg Oral BID  . pantoprazole  40 mg Oral Daily  . potassium chloride  40 mEq Oral Daily  . simvastatin  20 mg Oral QPM  . sodium chloride HYPERTONIC  4 mL Nebulization BID  . warfarin  2 mg Oral ONCE-1800  . Warfarin - Pharmacist  Dosing Inpatient   Does not apply q1800   Continuous Infusions:   LOS: 0 days    Time spent in minutes: 35    Calvert Cantor, MD [facility] Pager: [email or web address] Password [email or web address] 03/06/2017, 2:20 PM

## 2017-03-06 NOTE — Progress Notes (Signed)
Patients daughter, [name], requesting to speak to physician. Daughter concerned with patients care and failure to thrive tendencies.  MD paged with daughters number.   Avelina Laine RN

## 2017-03-06 NOTE — Care Management Note (Signed)
Case Management Note  Patient Details  Name: [name] MRN: [id number] Date of Birth: 1941-05-24  Subjective/Objective:                 Spoke with patient who states that she is active w [facility], confirmed w Samantha Crimes, liaison for [facility]. Patient has O2 through [place]. Spoke w daughter on the phone who knows to bring portable tank from home when she comes to the hospital tomorrow. No DME needs noted. CM will place resumption order for full services for [facility].    Action/Plan:  Anticipate DC to home w [facility] services 03/07/17.  Expected Discharge Date:  03/06/17               Expected Discharge Plan:  Home w Home Health Services  In-House Referral:     Discharge planning Services  CM Consult  Post Acute Care Choice:    Choice offered to:     DME Arranged:    DME Agency:     HH Arranged:  RN, PT, OT, Nurse's Aide, Social Work [employer] Agency:  [facility]  Status of Service:  In process, will continue to follow  If discussed at Long Length of Stay Meetings, dates discussed:    Additional Comments:  Lawerance Sabal, RN 03/06/2017, 2:47 PM

## 2017-03-06 NOTE — Consult Note (Addendum)
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 Sung Amabile.  Managed with O2 on exertion and qhs, flutter valve and chest PT.  She was admitted 10/2 with near syncope and palpitations.  PCCM consulted to assist with chronic respiratory failure.   SIGNIFICANT EVENTS    STUDIES:  CT head 10/2>>> 1. 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 2. Severe chronic pan paranasal sinusitis. Bilateral middle ear and mastoid effusions.   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 Sung Amabile.  Managed with O2 on exertion and qhs, flutter valve and chest PT.  She was admitted 10/2 with near syncope and palpitations.  PCCM consulted to assist with chronic respiratory failure.   Denies any respiratory c/o, cough, dyspnea, fever.  Feels breathing is at baseline.    PAST MEDICAL HISTORY :   has a past medical history of Allergy; Anemia; Arthritis; Blood transfusion without reported diagnosis; CAD (coronary artery disease); Carotid stenosis; Cataract; CHF (congestive heart failure) (HCC); Chronic back pain; Chronic bronchitis; Chronic kidney disease; Chronic systolic heart failure (HCC); Collagen vascular disease (HCC); Dextrocardia; Diabetes mellitus without complication (HCC); DVT of leg (deep  venous thrombosis) (HCC) (~2006); Fracture (05/24/2011); GERD (gastroesophageal reflux disease); H/O hiatal hernia; HLD (hyperlipidemia); Hypertension; Ischemic cardiomyopathy; Myocardial infarction (HCC); Neuromuscular disorder (HCC); Neuropathy; Osteoporosis; Oxygen deficiency; Pneumonia (06/13/11); Pulmonary embolus [facility]) (March 2013); Rheumatoid arthritis (HCC); and Situs inversus with dextrocardia.  has a past surgical history that includes ERCP (06/03/2011); Vaginal hysterectomy (1970's); Cardiac catheterization (06/11/11); Coronary artery bypass graft (06/14/2011); left heart catheterization with coronary angiogram (N/A, 06/11/2011); arch aortogram (Right, 06/11/2011); Cholecystectomy; Kyphoplasty (N/A, 01/02/2017); Kyphoplasty (N/A, 02/18/2017); and Back surgery. Prior to Admission medications   Medication Sig Start Date End Date Taking? Authorizing Provider  acetaminophen (TYLENOL) 160 MG/5ML elixir Take 320 mg by mouth every 6 (six) hours as needed for pain.    Yes [provider]  alendronate (FOSAMAX) 70 MG tablet Take 70 mg by mouth every Sunday.    Yes [provider]  allopurinol (ZYLOPRIM) 100 MG tablet Take 100 mg by mouth 2 (two) times daily.    Yes [provider]  aspirin EC 81 MG tablet Take 81 mg by mouth every morning.    Yes [provider]  calcium carbonate (TUMS) 500 MG chewable tablet Chew 1 tablet (200 mg of elemental calcium total) by mouth daily. 02/09/17 02/09/18 Yes Calvert Cantor, MD  cholecalciferol (VITAMIN D) 1000 UNITS tablet Take 1,000 Units by mouth every morning.    Yes [provider]  collagenase (SANTYL) ointment Apply topically daily. Patient taking differently: Apply 1 application topically daily. TO AFFECTED AREA 02/08/17  Yes Calvert Cantor, MD  folic acid (FOLVITE) 800 MCG tablet Take 800 mcg by mouth every morning.    Yes [provider]  furosemide (LASIX) 40 MG tablet Take 1  tablet (40 mg total) by mouth daily as  needed for fluid or edema. Take only if you gain 2-3 lb of fluid in a 24 hr period Patient taking differently: Take 40 mg by mouth every morning.  02/08/17 05/09/17 Yes Calvert Cantor, MD  gabapentin (NEURONTIN) 300 MG capsule Take 300 mg by mouth 2 (two) times daily.     Yes [provider]  guaiFENesin (MUCINEX) 600 MG 12 hr tablet Take 1 tablet (600 mg total) by mouth 2 (two) times daily. Patient taking differently: Take 600 mg by mouth 2 (two) times daily as needed for cough or to loosen phlegm.  02/09/17 02/09/18 Yes Calvert Cantor, MD  guaiFENesin (ROBITUSSIN) 100 MG/5ML SOLN Take 10 mLs (200 mg total) by mouth 3 (three) times daily. Patient taking differently: Take 10 mLs by mouth 2 (two) times daily.  02/09/17  Yes Calvert Cantor, MD  metFORMIN (GLUCOPHAGE-XR) 500 MG 24 hr tablet Take 500 mg by mouth 2 (two) times a week.  10/31/16  Yes [provider]  methotrexate (RHEUMATREX) 2.5 MG tablet Take 20 mg by mouth every Monday.    Yes [provider]  metoprolol tartrate (LOPRESSOR) 25 MG tablet Take 25 mg by mouth 2 (two) times daily.   Yes [provider]  omeprazole (PRILOSEC) 20 MG capsule Take 20 mg by mouth every morning.     Yes [provider]  Respiratory Therapy Supplies (FLUTTER) DEVI Use 10-15 times daily 09/10/16  Yes Merwyn Katos, MD  simvastatin (ZOCOR) 20 MG tablet Take 20 mg by mouth every evening.    Yes [provider]  sodium chloride HYPERTONIC 3 % nebulizer solution Take 4 mLs by nebulization 2 (two) times daily. Patient taking differently: Take 4 mLs by nebulization See admin instructions. ONE TO TWO TIMES A DAY 02/09/17  Yes Calvert Cantor, MD  HYDROmorphone (DILAUDID) 2 MG tablet Take 0.5 tablets (1 mg total) by mouth every 4 (four) hours as needed for severe pain. Patient not taking: Reported on 03/04/2017 02/08/17   Calvert Cantor, MD  ipratropium-albuterol (DUONEB) 0.5-2.5 (3) MG/3ML SOLN Take 3 mLs by nebulization every 6 (six)  hours as needed. Patient taking differently: Take 3 mLs by nebulization every 6 (six) hours as needed (for shortness of breath or wheezing).  11/14/16   Merwyn Katos, MD  polyethylene glycol (MIRALAX / Ethelene Hal) packet Take 17 g by mouth daily as needed for mild constipation. Patient not taking: Reported on 03/04/2017 02/08/17   Calvert Cantor, MD  warfarin (COUMADIN) 2 MG tablet Take 6 mg for 3 days. RN should come to your home to check the INR on Tuesday. Patient taking differently: Take 2-4 mg by mouth See admin instructions. 2 mg in the evening on Sun/Tues/Wed/Thurs/Sat and 4 mg on Mon/Fri 02/09/17   Calvert Cantor, MD   Allergies  Allergen Reactions  . Penicillins Rash    Has patient had a PCN reaction causing immediate rash, facial/tongue/throat swelling, SOB or lightheadedness with hypotension: Yes Has patient had a PCN reaction causing severe rash involving mucus membranes or skin necrosis: No Has patient had a PCN reaction that required hospitalization: No Has patient had a PCN reaction occurring within the last 10 years: No If all of the above answers are "NO", then may proceed with Cephalosporin use.   . Shellfish Allergy Anaphylaxis and Rash  . Hydrocodone Other (See Comments)    Pt stated "it's too high-powered, so it sends me to outer space"  . Oxycodone Other (See Comments)  Pt stated "it's too high-powered, so it sends me to outer space"  . Levaquin [Levofloxacin] Hives    FAMILY HISTORY:  family history includes Heart disease in her father and sister. SOCIAL HISTORY:  reports that she has never smoked. She has never used smokeless tobacco. She reports that she does not drink alcohol or use drugs.  REVIEW OF SYSTEMS:   As per HPI - All other systems reviewed and were neg.    SUBJECTIVE:   VITAL SIGNS: Temp:  [98.2 F (36.8 C)-99 F (37.2 C)] 98.2 F (36.8 C) (10/03 1000) Pulse Rate:  [87-101] 101 (10/03 1000) Resp:  [12-20] 16 (10/03 1000) BP: (90-114)/(48-64)  111/64 (10/03 1000) SpO2:  [96 %-100 %] 98 % (10/03 1000) Weight:  [68 kg (149 lb 14.6 oz)] 68 kg (149 lb 14.6 oz) (10/02 2121)  PHYSICAL EXAMINATION: General:  chronically ill appearing female, NAD  Neuro:  Awake, alert HEENT:  Mm moist, no JVD  Cardiovascular:  s1s2 rrr Lungs:  resps even non labored on [place], L>R basilar crackles  Abdomen:  Round, soft, non tender  Musculoskeletal:  Warm and dry, no edema     Recent Labs Lab 03/04/17 2030 03/05/17 0538 03/06/17 0337  NA 134* 136 137  K 2.3* 3.7 3.5  CL 85* 93* 97*  CO2 37* 36* 34*  BUN 11 11 7   CREATININE 1.05* 0.96 0.81  GLUCOSE 98 75 86    Recent Labs Lab 03/04/17 2030 03/05/17 0815 03/06/17 0337  HGB 11.2* 10.2* 9.0*  HCT 35.4* 32.1* 29.0*  WBC 16.7* 9.4 8.4  PLT 308 213 263   Ct Abdomen Pelvis Wo Contrast  Result Date: 03/05/2017 CLINICAL DATA:  76 year old female with history of nausea and non bilious vomiting. EXAM: CT ABDOMEN AND PELVIS WITHOUT CONTRAST TECHNIQUE: Multidetector CT imaging of the abdomen and pelvis was performed following the standard protocol without IV contrast. COMPARISON:  CT the abdomen and pelvis 12/30/2016. 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 Lower chest: Widespread peribronchovascular micronodularity noted throughout the lung bases, increased compared to the prior study. There is some associated cylindrical bronchiectasis, most evident in the left lower lobe where there is also profound thickening of the peribronchovascular interstitium. Some airspace consolidation is also noted in the left lower lobe, concerning for pneumonia. Small left and trace right pleural effusions lying dependently. Dextrocardia. Status post median sternotomy for CABG. Hepatobiliary: Left-sided liver. No definite cystic or solid hepatic lesions are confidently identified on today's noncontrast CT examination. Small amount of pneumobilia, presumably related to prior sphincterotomy, similar to the prior  study. Status post cholecystectomy. Pancreas: No definite pancreatic mass or peripancreatic inflammatory changes are noted on today's noncontrast CT examination. Spleen: Right-sided spleen.  Otherwise, unremarkable. Adrenals/Urinary Tract: Unenhanced appearance of the kidneys and bilateral adrenal glands is normal. No hydroureteronephrosis. Urinary bladder is unremarkable in appearance. Stomach/Bowel: Right-sided stomach. Appearance of the stomach is otherwise unremarkable. No pathologic dilatation of small bowel or colon. Status post appendectomy. Vascular/Lymphatic: Aortic atherosclerosis without definite aneurysm in the abdominal or pelvic vasculature. IVC filter in position with tip terminating shortly before the level of the renal veins. No lymphadenopathy noted in the abdomen or pelvis. Reproductive: Status posthysterectomy. Ovaries are not confidently identified may be surgically absent or atrophic. Other: No significant volume of ascites.  No pneumoperitoneum. Musculoskeletal: There are no aggressive appearing lytic or blastic lesions noted in the visualized portions of the skeleton. Chronic compression fracture of T12 again noted with 50% loss of anterior vertebral body height. New  post vertebroplasty changes are noted at T12. There is also a new compression fracture at L1 with post vertebroplasty changes an approximately 50% loss of anterior vertebral body height. Median sternotomy wires. IMPRESSION: 1. No acute findings are noted in the abdomen or pelvis to account for the patient's symptoms. 2. 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 3. Trace bilateral pleural effusions. 4. Aortic atherosclerosis. 5. Situs inversus totalis 6. Additional incidental findings, as  above. Aortic Atherosclerosis (ICD10-I70.0). Electronically Signed   By: Trudie Reed M.D.   On: 03/05/2017 21:57   Ct Head Wo Contrast  Result Date: 03/05/2017 CLINICAL DATA:  Dizziness and presyncope. Status post kyphoplasties February 18, 2017. History of hypertension, hyperlipidemia, diabetes, neuromuscular disorder. EXAM: CT HEAD WITHOUT CONTRAST TECHNIQUE: Contiguous axial images were obtained from the base of the skull through the vertex without intravenous contrast. COMPARISON:  None. FINDINGS: BRAIN: No intraparenchymal hemorrhage, mass effect nor midline shift. The ventricles and sulci are normal for age. Patchy supratentorial white matter hypodensities less than expected for patient's age, though non-specific are most compatible with chronic small vessel ischemic disease. 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 No acute large vascular territory infarcts. No abnormal extra-axial fluid collections. Basal cisterns are patent. VASCULAR: Moderate calcific atherosclerosis of the carotid siphons. SKULL: No skull fracture. Osteopenia. No significant scalp soft tissue swelling. SINUSES/ORBITS: Severe pan paranasal sinusitis. Bilateral middle ear and mastoid effusions without air cell coalescence. The included ocular globes and orbital contents are non-suspicious. Status post bilateral ocular lens implants. OTHER: None. IMPRESSION: 1. 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 2. Severe chronic pan paranasal sinusitis. Bilateral middle ear and mastoid effusions. Electronically Signed   By: Awilda Metro M.D.   On: 03/05/2017 00:27    ASSESSMENT / PLAN:  Discussion:  76 y/o F with a known PMH of situs inversus totalis, Kartagener's Syndrome (followed by Dr. Sung Amabile) who was admitted 10/1 with palpitations, generalized weakness and decreased PO intake.  She recently was admitted for an L1 compression fracture s/p kyphoplasty.  CXR was concerning for LLL  infiltrate.  Decreased appetite since discharge.    Primary Ciliary Dyskinesia / Kartagener's Syndrome  Acute on Chronic Hypoxic Respiratory Failure  LLL Infiltrate - may be mucus plugging given hx, and recent kyphoplasty / pain issues  Plan: Follow intermittent CXR  O2 as needed for sats 90-95% Continue pulmonary hygiene - IS, mobilize, chest vest, hypertonic nebs  Mucinex  Assess sputum culture  Outpatient pulmonary follow up with Dr. Sung Amabile / [facility]   Dirk Dress, NP 03/06/2017  4:34 PM Pager: [phone] or [phone]  Attending Note:  76 year old female with Kartagener's syndrome who presents with syncope and a CT was done where an incidental finding was noted on the LLL.  I reviewed this CT and old CT's no changes in the opacification.  This is likely a combination of chronic lack of ciliary motion and previous surgery.  On exam, decreased BS at the bases.  Discussed with PCCM-NP.  Pulmonary opacification:  - No need for f/u CT  Hypoxemia:  - Ambulate  - Treat atelectasis  - Titrate O2 for sat of 88-92%  LLL atelectasis:  - IS  - Flutter valve  - Ambulate  Bronchiectasis:  - Mucocilliary clearance as above  -  No signs of acute exacerbation at this time  F/U as outpatient.  PCCM will sign off, please call back if needed.  Patient seen and examined, agree with above note.  I dictated the care and orders written for this patient under my direction.  Alyson Reedy, MD [phone]

## 2017-03-06 NOTE — Progress Notes (Signed)
Physical Therapy Treatment Patient Details Name: [name] MRN: [id number] DOB: 08/27/1940 Today's Date: 03/06/2017    History of Present Illness [name] is a 76 y.o. female with history of SVT, CAD status post CABG, PE, situs inversus totalis, CKD, primary ciliary dyskinesia, gout who was recently admitted for L1 compression fracture and has undergone kyphoplasty and presently is at home had episodes of fainting spells with palpitations.     PT Comments    Continuing work on functional mobility and activity tolerance;  Discussed [name]'s case with [name], OT, who recommends SNF for short-term rehab to maximize independence and safety with mobility prior to dc home; this is not unreasonable, as [name] currently requires min/mod assist for sit to stand transfers; Still, Pt is Obs status, and am not sure that SNF stay would be covered by insurance; discussed with Case Mgr.  Follow Up Recommendations  Home health PT;Supervision for mobility/OOB (Also, if [facility] Medicare covers SNF stay for rehab for pts with Obs status, SNF for rehab is worth considering; if not, would maximize [facility] services)     Equipment Recommendations  None recommended by PT    Recommendations for Other Services       Precautions / Restrictions Precautions Precautions: Fall Precaution Comments: soft BP; watch O2 levels    Mobility  Bed Mobility Overal bed mobility: Needs Assistance Bed Mobility: Sit to Supine       Sit to supine: Min assist   General bed mobility comments: Min assist to help LEs into bed  Transfers Overall transfer level: Needs assistance Equipment used: Rolling walker (2 wheeled) Transfers: Sit to/from Stand Sit to Stand: Mod assist         General transfer comment: Mod assist to power up from low commode; cues for hand placement and safety  Ambulation/Gait Ambulation/Gait assistance: Min assist;Min guard Ambulation Distance (Feet): 20 Feet Assistive device:  Rolling walker (2 wheeled) Gait Pattern/deviations: Step-to pattern;Trunk flexed Gait velocity: decreased   General Gait Details: Cues to self-monitor for activity tolerance; Able to incr activity/gait distance a modest amount   Stairs            Wheelchair Mobility    Modified Rankin (Stroke Patients Only)       Balance     Sitting balance-Leahy Scale: Fair       Standing balance-Leahy Scale: Poor Standing balance comment: Reliant on UEs for support in standing.                            Cognition Arousal/Alertness: Awake/alert Behavior During Therapy: WFL for tasks assessed/performed;Flat affect Overall Cognitive Status: Within Functional Limits for tasks assessed                                        Exercises      General Comments        Pertinent Vitals/Pain Pain Assessment: Faces Faces Pain Scale: Hurts little more Pain Location: back Pain Descriptors / Indicators: Sore;Discomfort Pain Intervention(s): Repositioned    Home Living                      Prior Function            PT Goals (current goals can now be found in the care plan section) Acute Rehab PT Goals Patient Stated Goal: to  get stronger and get appetite back PT Goal Formulation: With patient Time For Goal Achievement: 03/19/17 Potential to Achieve Goals: Good Progress towards PT goals: Progressing toward goals    Frequency    Min 3X/week      PT Plan Current plan remains appropriate;Other (comment) (See PT comments and Recommendations)    Co-evaluation              AM-PAC PT "6 Clicks" Daily Activity  Outcome Measure  Difficulty turning over in bed (including adjusting bedclothes, sheets and blankets)?: None Difficulty moving from lying on back to sitting on the side of the bed? : None Difficulty sitting down on and standing up from a chair with arms (e.g., wheelchair, bedside commode, etc,.)?: Unable Help needed moving  to and from a bed to chair (including a wheelchair)?: A Little Help needed walking in hospital room?: A Little Help needed climbing 3-5 steps with a railing? : A Lot 6 Click Score: 17    End of Session Equipment Utilized During Treatment: Gait belt;Oxygen Activity Tolerance: Patient tolerated treatment well Patient left: in bed;with call bell/phone within reach Nurse Communication: Mobility status PT Visit Diagnosis: Muscle weakness (generalized) (M62.81);Pain;Dizziness and giddiness (R42) Pain - part of body:  (back)     Time: 1345-1415 PT Time Calculation (min) (ACUTE ONLY): 30 min  Charges:  $Gait Training: 8-22 mins $Therapeutic Activity: 8-22 mins                    G Codes:       Van Clines, PT  Acute Rehabilitation Services Pager [phone] Office [phone]'   [name] 03/06/2017, 4:13 PM

## 2017-03-06 NOTE — Progress Notes (Signed)
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 Penicillins Rash    Has patient had a PCN reaction causing immediate rash, facial/tongue/throat swelling, SOB or lightheadedness with hypotension: Yes Has patient had a PCN reaction causing severe rash involving mucus membranes or skin necrosis: No Has patient had a PCN reaction that required hospitalization: No Has patient had a PCN reaction occurring within the last 10 years: No If all of the above answers are "NO", then may proceed with Cephalosporin use.   . Shellfish Allergy Anaphylaxis and Rash  . Hydrocodone Other (See Comments)    Pt stated "it's too high-powered, so it sends me to outer space"  . Oxycodone Other (See Comments)    Pt stated "it's too high-powered, so it sends me to outer space"  . Levaquin [Levofloxacin] Hives    Patient Measurements: Height: 5\' 2"  (157.5 cm) Weight: 149 lb 14.6 oz (68 kg) IBW/kg (Calculated) : 50.1  Vital Signs: Temp: 98.2 F (36.8 C) (10/03 1000) Temp Source: Oral (10/03 1000) BP: 111/64 (10/03 1000) Pulse Rate: 101 (10/03 1000)  Labs:  Recent Labs  03/04/17 2030 03/05/17 0538 03/05/17 0815 03/05/17 0853 03/05/17 1200 03/06/17 0337  HGB 11.2*  --  10.2*  --   --  9.0*  HCT 35.4*  --  32.1*  --   --  29.0*  PLT 308  --  213  --   --  263  LABPROT  --  28.2*  --   --  33.4* 28.5*  INR  --  SPECIMEN CLOTTED  --   --  3.32 2.71  CREATININE 1.05* 0.96  --   --   --  0.81  TROPONINI <0.03  --   --  <0.03  --   --     Estimated Creatinine Clearance: 55.1 mL/min (by C-G formula based on SCr of 0.81 mg/dL).   Medical History: Past Medical History:  Diagnosis Date  . Allergy   . Anemia   . Arthritis   . Blood transfusion without reported diagnosis   . CAD (coronary artery disease)    NSTEMI in setting of gallstone pancreatitis 05/2011:  LHC with 3v CAD; CABG  was performed 06/14/11: RIMA-LAD, SVG-ramus, SVG-RCA  . Carotid stenosis    Dopplers 06/12/11: LICA 60-79%.  . Cataract   . CHF (congestive heart failure) (HCC)   . Chronic back pain   . Chronic bronchitis   . Chronic kidney disease   . Chronic systolic heart failure (HCC)   . Collagen vascular disease (HCC)   . Dextrocardia   . Diabetes mellitus without complication (HCC)   . DVT of leg (deep venous thrombosis) (HCC) ~2006   left  . Fracture 05/24/2011   right; "did not have surgery"  . GERD (gastroesophageal reflux disease)   . H/O hiatal hernia   . HLD (hyperlipidemia)   . Hypertension   . Ischemic cardiomyopathy    Echocardiogram 06/05/11: EF 40-45%, anteroseptal hypokinesis, apical hypokinesis, mild LAE  . Myocardial infarction (HCC)   . Neuromuscular disorder (HCC)   . Neuropathy   . Osteoporosis   . Oxygen deficiency    2l  . Pneumonia 06/13/11   "couple times; long time ago"  . Pulmonary embolus [facility]) March 2013  . Rheumatoid arthritis (HCC)   . Situs inversus with dextrocardia    CT 06/2011: situs inversus totalis.  What would typically be called RCA  arose from anterior sinus of Valsalva.  What would typically be called the left main arises from the posterior sinus of Valsalva and gives rise to a large first diagonal branch and diminutive circumflex    Medications:  No current facility-administered medications on file prior to encounter.    Current Outpatient Prescriptions on File Prior to Encounter  Medication Sig Dispense Refill  . acetaminophen (TYLENOL) 160 MG/5ML elixir Take 320 mg by mouth every 6 (six) hours as needed for pain.     Marland Kitchen alendronate (FOSAMAX) 70 MG tablet Take 70 mg by mouth every Sunday.     Marland Kitchen allopurinol (ZYLOPRIM) 100 MG tablet Take 100 mg by mouth 2 (two) times daily.     Marland Kitchen aspirin EC 81 MG tablet Take 81 mg by mouth every morning.     . calcium carbonate (TUMS) 500 MG chewable tablet Chew 1 tablet (200 mg of elemental calcium total) by mouth  daily. 90 tablet 3  . cholecalciferol (VITAMIN D) 1000 UNITS tablet Take 1,000 Units by mouth every morning.     . collagenase (SANTYL) ointment Apply topically daily. (Patient taking differently: Apply 1 application topically daily. TO AFFECTED AREA) 15 g 0  . folic acid (FOLVITE) 800 MCG tablet Take 800 mcg by mouth every morning.     . furosemide (LASIX) 40 MG tablet Take 1 tablet (40 mg total) by mouth daily as needed for fluid or edema. Take only if you gain 2-3 lb of fluid in a 24 hr period (Patient taking differently: Take 40 mg by mouth every morning. ) 90 tablet 3  . gabapentin (NEURONTIN) 300 MG capsule Take 300 mg by mouth 2 (two) times daily.      Marland Kitchen guaiFENesin (MUCINEX) 600 MG 12 hr tablet Take 1 tablet (600 mg total) by mouth 2 (two) times daily. (Patient taking differently: Take 600 mg by mouth 2 (two) times daily as needed for cough or to loosen phlegm. ) 60 tablet 2  . guaiFENesin (ROBITUSSIN) 100 MG/5ML SOLN Take 10 mLs (200 mg total) by mouth 3 (three) times daily. (Patient taking differently: Take 10 mLs by mouth 2 (two) times daily. ) 1200 mL 0  . metFORMIN (GLUCOPHAGE-XR) 500 MG 24 hr tablet Take 500 mg by mouth 2 (two) times a week.     . methotrexate (RHEUMATREX) 2.5 MG tablet Take 20 mg by mouth every Monday.     . metoprolol tartrate (LOPRESSOR) 25 MG tablet Take 25 mg by mouth 2 (two) times daily.    Marland Kitchen omeprazole (PRILOSEC) 20 MG capsule Take 20 mg by mouth every morning.      Marland Kitchen Respiratory Therapy Supplies (FLUTTER) DEVI Use 10-15 times daily 1 each 0  . simvastatin (ZOCOR) 20 MG tablet Take 20 mg by mouth every evening.     . sodium chloride HYPERTONIC 3 % nebulizer solution Take 4 mLs by nebulization 2 (two) times daily. (Patient taking differently: Take 4 mLs by nebulization See admin instructions. ONE TO TWO TIMES A DAY) 750 mL 12  . enoxaparin (LOVENOX) 100 MG/ML injection Inject 1 mL (100 mg total) into the skin daily. (Patient not taking: Reported on 02/27/2017) 10  Syringe 0  . HYDROmorphone (DILAUDID) 2 MG tablet Take 0.5 tablets (1 mg total) by mouth every 4 (four) hours as needed for severe pain. (Patient not taking: Reported on 03/04/2017) 30 tablet 0  . ipratropium-albuterol (DUONEB) 0.5-2.5 (3) MG/3ML SOLN Take 3 mLs by nebulization every 6 (six) hours as needed. (Patient taking differently:  Take 3 mLs by nebulization every 6 (six) hours as needed (for shortness of breath or wheezing). ) 360 mL 5  . polyethylene glycol (MIRALAX / GLYCOLAX) packet Take 17 g by mouth daily as needed for mild constipation. (Patient not taking: Reported on 03/04/2017) 14 each 0  . warfarin (COUMADIN) 2 MG tablet Take 6 mg for 3 days. RN should come to your home to check the INR on Tuesday. (Patient taking differently: Take 2-4 mg by mouth See admin instructions. 2 mg in the evening on Sun/Tues/Wed/Thurs/Sat and 4 mg on Mon/Fri) 65 tablet 3     Assessment: 76 y.o. female admitted 03/04/17 with weakness, h/o saddle PE (08/2011) and LLE DVT (~ 2006) and supratherapeutic INR of 4.1 on admission 03/04/17. Pharmacy consulted to continue Coumadin.   Today the INR = 2.71, therapeutic after coumadin dose held 10/1 and 2mg  given 10/2.  H/H slight decreased 11.2/35.4 on admit to  9.0/29.0,and pltc wnl /stable.  No bleeding reported   PTA coumadin dosing/ Outpatient Anticoag visits:  02/27/17: INR = 1.8:  On coumadin 2mg  daily except 4mg  qMon/Fri. + Lovenox SQ (last took lovenox on 9/24). S/p recent hospital admission, had kyphoplasty done on 02/18/17.  Took coumadin 4mg  on 9/18th, 19th and 20th then 2mg  daily except 4mg  qMon/Fri.   ---RPh instructed pt on 9/26 to change dose to 4mg  on 9/26, 3mg  on 9/27 then continue 2mg  daily except 4mg  qMon/Fri.   03/04/17: INR = 4.1: on Coumadin 4mg  on 9/26, 3mg  on 9/27 then on 9/28 (Fri) continue 2mg  daily except 4mg  qMon/Fri. ( so 4mg  -3mg -4mg -2mg -2mg ) prior to 10/1 admission. Last taken 9/30.   Goal of Therapy:  INR 2-3 Monitor platelets by  anticoagulation protocol: Yes   Plan:  Coumadin 2 mg tonight Daily INR  , RPh Clinical Pharmacist Pager: [phone] 8a-330p [phone] 330p-1030p phone [phone] or [phone] Main pharmacy [phone]  03/06/2017,11:16 AM

## 2017-03-06 NOTE — Progress Notes (Signed)
Initial Nutrition Assessment  DOCUMENTATION CODES:   Non-severe (moderate) malnutrition in context of chronic illness  INTERVENTION:    Ensure Enlive po BID, each supplement provides 350 kcal and 20 grams of protein  NUTRITION DIAGNOSIS:   Malnutrition (moderate) related to chronic illness (chronic back pain, CKD, chronic systolic heart failure) as evidenced by moderate depletions of muscle mass, mild fluid accumulation, percent weight loss (9% x 2 months)  GOAL:   Patient will meet greater than or equal to 90% of their needs  MONITOR:   PO intake, Supplement acceptance, Labs, Weight trends, Skin, I & O's  REASON FOR ASSESSMENT:   Consult Assessment of nutrition requirement/status  ASSESSMENT:   76 y.o. Female with history of SVT, CAD status post CABG, pulmonary embolism, situs inversus totalis, chronic kidney disease, primary ciliary dyskinesia, gout who was recently admitted for L1 compression fracture and has undergone kyphoplasty and presently is at home had episodes of fainting spells with palpitations.   Pt seen per Clinical Nutrition during recent hospitalization in 02/2017. S/p back surgery 01/02/17. Admitted in September for compression fracture. She's had a general decline in functional status since discharge.  Pt reports to this RD her appetite is improving. She ate well at breakfast this AM. She was experiencing a poor appetite for a few days PTA; stated "I just couldn't get it down". Pt has been drinking three (3) Ensure supplements per day at home since discharge.  Also endorses unintentional weight loss of about 15 lbs (9%) x 2 months. UBW is around 160 lbs. Severe for time frame. Medications reviewed and include Coumadin, Vit D and folvite. Labs reviewed. CBG's 95-81-95.  Nutrition-Focused physical exam completed. Findings are no fat depletion, moderate muscle depletion, and mild edema.   Diet Order:  Diet heart healthy/carb modified Room service  appropriate? Yes; Fluid consistency: Thin  Skin:  Pressure injury to buttocks  Last BM:  N/A  Height:   Ht Readings from Last 1 Encounters:  03/05/17 5\' 2"  (1.575 m)   Weight:   Wt Readings from Last 1 Encounters:  03/05/17 149 lb 14.6 oz (68 kg)   Ideal Body Weight:  50 kg  BMI:  Body mass index is 27.42 kg/m.  Estimated Nutritional Needs:   Kcal:  1700-1900  Protein:  80-95 gm  Fluid:  1.7-1.9 L  EDUCATION NEEDS:   No education needs identified at this time  05/05/17, RD, LDN Pager #: [phone] After-Hours Pager #: [phone]

## 2017-03-06 NOTE — Evaluation (Signed)
Occupational Therapy Evaluation Patient Details Name: [name] MRN: [id number] DOB: November 27, 1940 Today's Date: 03/06/2017    History of Present Illness [name] is a 76 y.o. female with history of SVT, CAD status post CABG, PE, situs inversus totalis, CKD, primary ciliary dyskinesia, gout who was recently admitted for L1 compression fracture and has undergone kyphoplasty and presently is at home had episodes of fainting spells with palpitations.    Clinical Impression   PTA Pt was recovering from back sx and modified independent in ADL and mobility with RW. However, Pt was not taking baths, and was not having success cooking (it is hard to get in her kitchen with the RW, plus she was having nausea and did not have any desire to eat). Pt is currently limited by generalized weakness, dizziness, and has decreased independence in ADL (generally at min A level) and mobility with RW (min A). Pt will require skilled OT in the acute setting, and afterwards to maximize safety and independence in ADL and functional transfers. SNF is BEST option and the safest as she has family, but they are unavailable to help as they work full time. Pt presents as a high fall risk in my opinion, and the SNF level therapy is safest. If SNF if not available she will need to continue HHOT. Next session to work on energy conservation (and application to maintaining back precautions) and functional ADL activities to work on fatigue.    Follow Up Recommendations  SNF;Home health OT;Supervision/Assistance - 24 hour (SNF is BEST option for prevention of re-admission)    Equipment Recommendations  None recommended by OT (Pt has appropriate DME)    Recommendations for Other Services       Precautions / Restrictions Precautions Precautions: Fall Precaution Comments: soft BP; watch O2 levels Restrictions Weight Bearing Restrictions: No      Mobility Bed Mobility Overal bed mobility: Needs Assistance Bed  Mobility: Rolling;Sidelying to Sit Rolling: Min assist Sidelying to sit: Min assist       General bed mobility comments: HOB flat, verbal cues for technique to maintain precautions, assist to bring legs EOB, min A for trunk elevation  Transfers Overall transfer level: Needs assistance Equipment used: Rolling walker (2 wheeled) Transfers: Sit to/from [employer] Sit to Stand: Min assist Stand pivot transfers: Min assist       General transfer comment: vc for safe hand placemenet, min A for power to standing and balance as well as lowering to chair    Balance Overall balance assessment: Needs assistance Sitting-balance support: Feet supported;No upper extremity supported Sitting balance-Leahy Scale: Fair Sitting balance - Comments: sitting EOB for MD exams   Standing balance support: During functional activity;Bilateral upper extremity supported Standing balance-Leahy Scale: Poor Standing balance comment: Reliant on UEs for support in standing.                           ADL either performed or assessed with clinical judgement   ADL Overall ADL's : Needs assistance/impaired Eating/Feeding: Modified independent;Sitting;Bed level Eating/Feeding Details (indicate cue type and reason): had eaten 2/3 of breakfast when OT entered room Grooming: Set up;Sitting   Upper Body Bathing: Set up;Moderate assistance;Sitting Upper Body Bathing Details (indicate cue type and reason): mod A for full back washing Lower Body Bathing: Moderate assistance   Upper Body Dressing : Minimal assistance;Sitting   Lower Body Dressing: Maximal assistance;Bed level Lower Body Dressing Details (indicate cue type and reason): to  don socks Toilet Transfer: Minimal Science writer Details (indicate cue type and reason): simulated through recliner transfer. min A for boost and steady/balance Toileting- Clothing Manipulation and Hygiene: Minimal  assistance   Tub/ Shower Transfer: Tub transfer;Shower seat (high tub edge)   Functional mobility during ADLs: Minimal assistance;Rolling walker (stand pivot only) General ADL Comments: Pt limited by weakness, fatigue, dizziness     Vision Patient Visual Report: No change from baseline       Perception     Praxis      Pertinent Vitals/Pain Pain Assessment: Faces Faces Pain Scale: Hurts little more Pain Location: back Pain Descriptors / Indicators: Sore;Discomfort Pain Intervention(s): Repositioned;Monitored during session     Hand Dominance Right   Extremity/Trunk Assessment Upper Extremity Assessment Upper Extremity Assessment: Generalized weakness   Lower Extremity Assessment Lower Extremity Assessment: Defer to PT evaluation   Cervical / Trunk Assessment Cervical / Trunk Assessment: Kyphotic;Other exceptions Cervical / Trunk Exceptions: recent kyphoplasty on 9/17   Communication Communication Communication: No difficulties   Cognition Arousal/Alertness: Awake/alert Behavior During Therapy: WFL for tasks assessed/performed;Flat affect Overall Cognitive Status: Within Functional Limits for tasks assessed                                     General Comments  Pt's O2 levels were 88-90 on 2L O2 when OT entered the room, bumped up to 3 L of O2 and sats when up to 92%, bumped up again to 4L for transfer (no complaints of dizziness with standing) O2 levels 92-94, When seated in recliner and comfy back to 2L O2 and was reading at 91.l    Exercises     Shoulder Instructions      Home Living Family/patient expects to be discharged to:: Private residence Living Arrangements: Alone Available Help at Discharge: Family;Available PRN/intermittently (my family works a lot) Type of Home: Apartment Home Access: Level entry     Home Layout: One level     Bathroom Shower/Tub: IT trainer: Standard Bathroom Accessibility:  No   Home Equipment: Environmental consultant - 2 wheels;Cane - single point;Shower seat;Grab bars - tub/shower;Bedside commode          Prior Functioning/Environment Level of Independence: Independent with assistive device(s)        Comments: Uses RW. Does her own ADL (but has been sponge bathing). Has HHPT/OT coming out twice/week and [facility] nurse.         OT Problem List: Decreased strength;Decreased activity tolerance;Impaired balance (sitting and/or standing);Pain      OT Treatment/Interventions: Self-care/ADL training;Therapeutic exercise;Energy conservation;Therapeutic activities;Patient/family education;Balance training    OT Goals(Current goals can be found in the care plan section) Acute Rehab OT Goals Patient Stated Goal: to get stronger and get appetite back OT Goal Formulation: With patient Time For Goal Achievement: 03/20/17 Potential to Achieve Goals: Good ADL Goals Pt Will Perform Upper Body Bathing: with set-up;with adaptive equipment;with caregiver independent in assisting;sitting Pt Will Perform Lower Body Bathing: with set-up;with caregiver independent in assisting;with adaptive equipment;sitting/lateral leans Pt Will Transfer to Toilet: ambulating;with supervision (with RW; BSC over toilet) Pt Will Perform Toileting - Clothing Manipulation and hygiene: with supervision;sit to/from stand Additional ADL Goal #1: Pt will recall 3 ways of conserving energy while performing ADL with 2 or less verbal cues Additional ADL Goal #2: Pt will perform bed mobility at supervision level maintaining back precautions   OT Frequency: Min 2X/week  Barriers to D/C: Decreased caregiver support  Pt lives alone       Co-evaluation              AM-PAC PT "6 Clicks" Daily Activity     Outcome Measure Help from another person eating meals?: None Help from another person taking care of personal grooming?: None Help from another person toileting, which includes using toliet, bedpan, or  urinal?: A Little Help from another person bathing (including washing, rinsing, drying)?: A Lot Help from another person to put on and taking off regular upper body clothing?: A Little Help from another person to put on and taking off regular lower body clothing?: A Lot 6 Click Score: 18   End of Session Equipment Utilized During Treatment: Gait belt;Rolling walker Nurse Communication: Mobility status;Other (comment) (O2 levels)  Activity Tolerance: Patient limited by fatigue Patient left: in chair;with call bell/phone within reach;with chair alarm set  OT Visit Diagnosis: Unsteadiness on feet (R26.81);Muscle weakness (generalized) (M62.81)                Time: 4709-6283 OT Time Calculation (min): 37 min Charges:  OT General Charges $OT Visit: 1 Visit OT Evaluation $OT Eval Moderate Complexity: 1 Mod OT Treatments $Self Care/Home Management : 8-22 mins G-Codes: OT G-codes **NOT FOR INPATIENT CLASS** Functional Assessment Tool Used: AM-PAC 6 Clicks Daily Activity Functional Limitation: Self care Self Care Current Status (M6294): At least 40 percent but less than 60 percent impaired, limited or restricted Self Care Goal Status (T6546): At least 20 percent but less than 40 percent impaired, limited or restricted   Sherryl Manges OTR/L [phone]  [name] 03/06/2017, 10:07 AM

## 2017-03-07 ENCOUNTER — Other Ambulatory Visit: Payer: Self-pay | Admitting: *Deleted

## 2017-03-07 ENCOUNTER — Ambulatory Visit: Payer: Self-pay | Admitting: *Deleted

## 2017-03-07 DIAGNOSIS — E1169 Type 2 diabetes mellitus with other specified complication: Secondary | ICD-10-CM

## 2017-03-07 DIAGNOSIS — S32010A Wedge compression fracture of first lumbar vertebra, initial encounter for closed fracture: Secondary | ICD-10-CM

## 2017-03-07 LAB — EXPECTORATED SPUTUM ASSESSMENT W REFEX TO RESP CULTURE

## 2017-03-07 LAB — GLUCOSE, CAPILLARY
GLUCOSE-CAPILLARY: 108 mg/dL — AB (ref 65–99)
Glucose-Capillary: 97 mg/dL (ref 65–99)

## 2017-03-07 LAB — PROTIME-INR
INR: 2.33
PROTHROMBIN TIME: 25.4 s — AB (ref 11.4–15.2)

## 2017-03-07 LAB — BASIC METABOLIC PANEL
ANION GAP: 6 (ref 5–15)
BUN: 11 mg/dL (ref 6–20)
CALCIUM: 7.9 mg/dL — AB (ref 8.9–10.3)
CHLORIDE: 101 mmol/L (ref 101–111)
CO2: 32 mmol/L (ref 22–32)
CREATININE: 0.87 mg/dL (ref 0.44–1.00)
GFR calc non Af Amer: >60 mL/min (ref >60)
GLUCOSE: 106 mg/dL — AB (ref 65–99)
Potassium: 4.1 mmol/L (ref 3.5–5.1)
Sodium: 139 mmol/L (ref 135–145)

## 2017-03-07 LAB — EXPECTORATED SPUTUM ASSESSMENT W GRAM STAIN, RFLX TO RESP C

## 2017-03-07 MED ORDER — ENSURE ENLIVE PO LIQD: 237.0000 mL | Freq: Three times a day (TID) | ORAL | 12 refills | Status: AC

## 2017-03-07 MED ORDER — OMEPRAZOLE 20 MG PO CPDR: 40.0000 mg | DELAYED_RELEASE_CAPSULE | ORAL | 0 refills | Status: AC

## 2017-03-07 MED ORDER — WARFARIN SODIUM 4 MG PO TABS: 4.0000 mg | ORAL_TABLET | Freq: Once | ORAL | Status: DC

## 2017-03-07 MED ORDER — WARFARIN SODIUM 2 MG PO TABS
2.0000 mg | ORAL_TABLET | Freq: Once | ORAL | Status: DC
  Filled 2017-03-07: qty 1

## 2017-03-07 NOTE — Consult Note (Addendum)
   [facility] CM Inpatient Consult   03/07/2017  [name] July 30, 1940 [id number]     [name] is active with [facility] Care Management program.   Please see chart review then encounters for further patient outreach details.   Spoke with inpatient RNCM to make aware that [facility] is active. Made aware that [name] will have home health services with [facility] as well. [facility] Care Management will not interfere or replace services provided by home health.   Spoke with [name] at bedside. She is agreeable to ongoing [facility] Care Management services.   Will update [facility] Community team. [name] slated to discharge today.    Raiford Noble, MSN-Ed, RN,BSN [facility] Liaison [phone]

## 2017-03-07 NOTE — Progress Notes (Signed)
Physical Therapy Treatment Patient Details Name: [name] MRN: [id number] DOB: [birth year] Today's Date: 03/07/2017    History of Present Illness [name] is a 76 y.o. female with history of SVT, CAD status post CABG, PE, situs inversus totalis, CKD, primary ciliary dyskinesia, gout who was recently admitted for L1 compression fracture and has undergone kyphoplasty and presently is at home had episodes of fainting spells with palpitations.     PT Comments    Pt progressing well. Increased gait distance as well as decreased assist with gait. Pt continues to require min assist to power up from low surface. She fatigues quickly. Plan is for d/c home today with HHPT.   Follow Up Recommendations  Home health PT;Supervision for mobility/OOB     Equipment Recommendations  None recommended by PT    Recommendations for Other Services       Precautions / Restrictions Precautions Precautions: Fall;Other (comment) Precaution Comments: soft BP; watch O2 levels    Mobility  Bed Mobility               General bed mobility comments: Pt received in recliner.  Transfers   Equipment used: Rolling walker (2 wheeled)   Sit to Stand: Min assist Stand pivot transfers: Min assist       General transfer comment: increased time and effort  Ambulation/Gait Ambulation/Gait assistance: Supervision Ambulation Distance (Feet): 80 Feet Assistive device: Rolling walker (2 wheeled) Gait Pattern/deviations: Step-through pattern;Decreased stride length;Trunk flexed Gait velocity: decreased   General Gait Details: no physical assist needed. Assist for line management.   Stairs            Wheelchair Mobility    Modified Rankin (Stroke Patients Only)       Balance   Sitting-balance support: Feet supported;No upper extremity supported Sitting balance-Leahy Scale: Good     Standing balance support: During functional activity;Bilateral upper extremity  supported Standing balance-Leahy Scale: Fair Standing balance comment: RW needed for ambulation                            Cognition Arousal/Alertness: Awake/alert Behavior During Therapy: WFL for tasks assessed/performed Overall Cognitive Status: Within Functional Limits for tasks assessed                                        Exercises      General Comments General comments (skin integrity, edema, etc.): SpO2 96% on 2 L O2      Pertinent Vitals/Pain Pain Assessment: No/denies pain    Home Living                      Prior Function            PT Goals (current goals can now be found in the care plan section) Acute Rehab PT Goals Patient Stated Goal: to get stronger and get appetite back PT Goal Formulation: With patient Time For Goal Achievement: 03/19/17 Potential to Achieve Goals: Good Progress towards PT goals: Progressing toward goals    Frequency    Min 3X/week      PT Plan Current plan remains appropriate    Co-evaluation              AM-PAC PT "6 Clicks" Daily Activity  Outcome Measure  Difficulty turning over in bed (including adjusting bedclothes, sheets and blankets)?:  None Difficulty moving from lying on back to sitting on the side of the bed? : None Difficulty sitting down on and standing up from a chair with arms (e.g., wheelchair, bedside commode, etc,.)?: Unable Help needed moving to and from a bed to chair (including a wheelchair)?: A Little Help needed walking in hospital room?: None Help needed climbing 3-5 steps with a railing? : A Little 6 Click Score: 19    End of Session Equipment Utilized During Treatment: Gait belt;Oxygen Activity Tolerance: Patient tolerated treatment well Patient left: in chair;with call bell/phone within reach Nurse Communication: Mobility status PT Visit Diagnosis: Muscle weakness (generalized) (M62.81);Pain;Dizziness and giddiness (R42)     Time: 1010-1035 PT  Time Calculation (min) (ACUTE ONLY): 25 min  Charges:  $Gait Training: 23-37 mins                    G Codes:       Aida Raider, PT  Office # [phone] Pager [phone]    Ilda Foil 03/07/2017, 10:43 AM

## 2017-03-07 NOTE — Progress Notes (Signed)
Occupational Therapy Treatment Patient Details Name: [name] MRN: [id number] DOB: 1940/09/14 Today's Date: 03/07/2017    History of present illness [name] is a 76 y.o. female with history of SVT, CAD status post CABG, PE, situs inversus totalis, CKD, primary ciliary dyskinesia, gout who was recently admitted for L1 compression fracture and has undergone kyphoplasty and presently is at home had episodes of fainting spells with palpitations.    OT comments  Pt progressing towards OT goals this session. Able to perform toilet transfer, seated grooming, and energy conservation education as applied to ADL (with handout). Pt is anxious about going home as she will be alone during the day and is still requiring assist for transfers. OT continues to recommend SNF as the best and safest option for short term rehab prior to returning home to build up strength, stamina, and reinforce back precautions., if SNF if not available pt will require max HH services.   Follow Up Recommendations  SNF;Home health OT;Supervision/Assistance - 24 hour (SNF is BEST option)    Equipment Recommendations  None recommended by OT    Recommendations for Other Services      Precautions / Restrictions Precautions Precautions: Fall;Other (comment) Precaution Comments: soft BP; watch O2 levels Restrictions Weight Bearing Restrictions: No       Mobility Bed Mobility               General bed mobility comments: OOB in recliner, and returned to recliner this sessino  Transfers Overall transfer level: Needs assistance Equipment used: Rolling walker (2 wheeled) Transfers: Sit to/from [employer] Sit to Stand: Min assist Stand pivot transfers: Min assist       General transfer comment: increased time and effort    Balance Overall balance assessment: Needs assistance Sitting-balance support: Feet supported;No upper extremity supported Sitting balance-Leahy Scale: Good      Standing balance support: During functional activity;Bilateral upper extremity supported Standing balance-Leahy Scale: Fair Standing balance comment: RW needed for ambulation                           ADL either performed or assessed with clinical judgement   ADL Overall ADL's : Needs assistance/impaired     Grooming: Wash/dry hands;Set up;Sitting                   Toilet Transfer: Minimal assistance;Stand-pivot;BSC;RW Toilet Transfer Details (indicate cue type and reason): vc for safe hand placement, increased time and effort required Toileting- Clothing Manipulation and Hygiene: Maximal assistance Toileting - Clothing Manipulation Details (indicate cue type and reason): Pt performed sit to stand and OT provided peri care with warm wash cloth     Functional mobility during ADLs: Minimal assistance;Rolling walker General ADL Comments: Energy conservation education provided along with handout; Pt concerned about going home, being alone, and not eating or drinking enough again and re-lapsing     Vision       Perception     Praxis      Cognition Arousal/Alertness: Awake/alert Behavior During Therapy: Anxious Overall Cognitive Status: Within Functional Limits for tasks assessed                                          Exercises     Shoulder Instructions       General Comments O2 remained on for entire session  Pertinent Vitals/ Pain       Pain Assessment: 0-10 Pain Score: 6  Pain Location: back Pain Descriptors / Indicators: Sore;Discomfort Pain Intervention(s): Monitored during session;Repositioned  Home Living                                          Prior Functioning/Environment              Frequency  Min 2X/week        Progress Toward Goals  OT Goals(current goals can now be found in the care plan section)  Progress towards OT goals: Progressing toward goals  Acute Rehab OT  Goals Patient Stated Goal: to get stronger and get appetite back OT Goal Formulation: With patient Time For Goal Achievement: 03/20/17 Potential to Achieve Goals: Good  Plan Discharge plan remains appropriate;Frequency remains appropriate    Co-evaluation                 AM-PAC PT "6 Clicks" Daily Activity     Outcome Measure   Help from another person eating meals?: None Help from another person taking care of personal grooming?: None Help from another person toileting, which includes using toliet, bedpan, or urinal?: A Little Help from another person bathing (including washing, rinsing, drying)?: A Lot Help from another person to put on and taking off regular upper body clothing?: A Little Help from another person to put on and taking off regular lower body clothing?: A Lot 6 Click Score: 18    End of Session Equipment Utilized During Treatment: Gait belt;Rolling walker  OT Visit Diagnosis: Unsteadiness on feet (R26.81);Muscle weakness (generalized) (M62.81)   Activity Tolerance Patient limited by fatigue   Patient Left in chair;with call bell/phone within reach;with chair alarm set   Nurse Communication Mobility status        Time: 0867-6195 OT Time Calculation (min): 21 min  Charges: OT General Charges $OT Visit: 1 Visit OT Treatments $Self Care/Home Management : 8-22 mins  Sherryl Manges OTR/L [phone]  Evern Bio Parv Manthey 03/07/2017, 2:17 PM

## 2017-03-07 NOTE — Discharge Summary (Signed)
Physician Discharge Summary  [name] [place] ZOX:[id number] DOB: [birth year] DOA: 03/04/2017  PCP: Elias Else, MD  Admit date: 03/04/2017 Discharge date: 03/07/2017  Admitted From: home Disposition:  home   Recommendations for Outpatient Follow-up:  Family wanting to transition her to ALF F/u on oral intake/ GI issues   Home Health:  ordered  Discharge Condition:  stable   CODE STATUS:  Full code   Diet recommendation:  diabetic Consultations:  Pulmonary     Discharge Diagnoses:  Principal Problem:   Near syncope Active Problems:   SVT (supraventricular tachycardia) (HCC)   Kartagener syndrome   Closed compression fracture of L1 lumbar vertebra (HCC)   Malnutrition of moderate degree   Hypokalemia   DM2 (diabetes mellitus, type 2) (HCC)   CAD (coronary artery disease)   Saddle embolus of pulmonary artery (HCC)   Primary ciliary dyskinesia   Rheumatoid arthritis (HCC)   Decubital ulcer   CKD (chronic kidney disease) stage 3, GFR 30-59 ml/min (HCC)    Subjective: Upper abdominal pain/soreness not as bad as yesterday. No nausea or other abnormal symptoms when eating.   Brief Summary: [name] SVT, CAD status post CABG, PE, situs inversus totalis, gout, recently admitted for L1 compression fracture status post kyphoplasty presented from home with the patient of fainting associated with the palpitation and generalized weakness. Patient also with decreased oral intake and gradually declining in functional status since his recent hospital discharge. In the ER patient was found to have severe hypokalemia and dehydration. Electrolyte repleted. Admitted for further evaluation.  Hospital Course:  Principal Problem:   Near syncope - dehydration, poor oral intake- resolved with hydration  Active Problems: Poor oral intake/   Malnutrition of moderate degree - CT abd/pelvis reviewed- no GI issues noted - admits to heart burn, reflux and is tender in epigastrium -  take Prilosec 20 mg daily- may have gastritis/GERD - will increase Prilosec to 40 daily today - noted to be eating at least 1/2 her meals and drinking liquids well now - family also state she is depressed since husband died and lives alone and no longer cooks much for herself   - cont Ensure TID at the least  - normal TSH  Abnormal CT chest, h/o Kartanger's syndrome/ Primary ciliary dyskinesia Acute on chronic hypoxic resp failure - follows with Dr Sung Amabile - ? MAI with LLL consolidation- with symptoms of poor oral intake, may have an indolent infection - cough is chronic but now needing more O2 than baseline- pulse ox is 89% on 3 L - usually uses 2 L -cont Mucinex, Guaifenesin, Hypertonic nebs, Duonebs - have asked for pulm eval - recommending IS, Flutter valve- no need for f/u CT- pulse ox improved to baseline today- cont pulmonary toilet at home    SVT (supraventricular tachycardia)   - occurring during a prior admission and had a very short episode during this admission - cont Metoprolol      Closed compression fracture of L1 lumbar vertebra  - s/p augmentation last month - pain has improved    Hypokalemia - replaced     DM2 (diabetes mellitus, type 2)  - Metformin      Saddle embolus of pulmonary artery  Coumadin per pharmacy-INR  therapeutic    CKD (chronic kidney disease) stage 3, GFR 30-59 ml/min (HCC) - stable  Rheumatoid arthritis  - Methotrexate  Gout - Allopurinol   Discharge Instructions  Discharge Instructions    Diet Carb Modified    Complete by:  As directed    Increase activity slowly    Complete by:  As directed      Allergies as of 03/07/2017      Reactions   Penicillins Rash   Has patient had a PCN reaction causing immediate rash, facial/tongue/throat swelling, SOB or lightheadedness with hypotension: Yes Has patient had a PCN reaction causing severe rash involving mucus membranes or skin necrosis: No Has patient had a PCN reaction  that required hospitalization: No Has patient had a PCN reaction occurring within the last 10 years: No If all of the above answers are "NO", then may proceed with Cephalosporin use.   Shellfish Allergy Anaphylaxis, Rash   Hydrocodone Other (See Comments)   Pt stated "it's too high-powered, so it sends me to outer space"   Oxycodone Other (See Comments)   Pt stated "it's too high-powered, so it sends me to outer space"   Levaquin [levofloxacin] Hives      Medication List    TAKE these medications   acetaminophen 160 MG/5ML elixir Commonly known as:  TYLENOL Take 320 mg by mouth every 6 (six) hours as needed for pain.   alendronate 70 MG tablet Commonly known as:  FOSAMAX Take 70 mg by mouth every Sunday.   allopurinol 100 MG tablet Commonly known as:  ZYLOPRIM Take 100 mg by mouth 2 (two) times daily.   aspirin EC 81 MG tablet Take 81 mg by mouth every morning.   calcium carbonate 500 MG chewable tablet Commonly known as:  TUMS Chew 1 tablet (200 mg of elemental calcium total) by mouth daily.   cholecalciferol 1000 units tablet Commonly known as:  VITAMIN D Take 1,000 Units by mouth every morning.   collagenase ointment Commonly known as:  SANTYL Apply topically daily. What changed:  how much to take  additional instructions   feeding supplement (ENSURE ENLIVE) Liqd Take 237 mLs by mouth 3 (three) times daily between meals.   FLUTTER Devi Use 10-15 times daily   folic acid 800 MCG tablet Commonly known as:  FOLVITE Take 800 mcg by mouth every morning.   furosemide 40 MG tablet Commonly known as:  LASIX Take 1 tablet (40 mg total) by mouth daily as needed for fluid or edema. Take only if you gain 2-3 lb of fluid in a 24 hr period What changed:  when to take this  additional instructions   gabapentin 300 MG capsule Commonly known as:  NEURONTIN Take 300 mg by mouth 2 (two) times daily.   guaiFENesin 100 MG/5ML Soln Commonly known as:   ROBITUSSIN Take 10 mLs (200 mg total) by mouth 3 (three) times daily. What changed:  when to take this   guaiFENesin 600 MG 12 hr tablet Commonly known as:  MUCINEX Take 1 tablet (600 mg total) by mouth 2 (two) times daily. What changed:  when to take this  reasons to take this   HYDROmorphone 2 MG tablet Commonly known as:  DILAUDID Take 0.5 tablets (1 mg total) by mouth every 4 (four) hours as needed for severe pain.   ipratropium-albuterol 0.5-2.5 (3) MG/3ML Soln Commonly known as:  DUONEB Take 3 mLs by nebulization every 6 (six) hours as needed. What changed:  reasons to take this   metFORMIN 500 MG 24 hr tablet Commonly known as:  GLUCOPHAGE-XR Take 500 mg by mouth 2 (two) times a week.   methotrexate 2.5 MG tablet Commonly known as:  RHEUMATREX Take 20 mg by mouth every Monday.   metoprolol tartrate  25 MG tablet Commonly known as:  LOPRESSOR Take 25 mg by mouth 2 (two) times daily.   omeprazole 20 MG capsule Commonly known as:  PRILOSEC Take 2 capsules (40 mg total) by mouth every morning. What changed:  how much to take   polyethylene glycol packet Commonly known as:  MIRALAX / GLYCOLAX Take 17 g by mouth daily as needed for mild constipation.   simvastatin 20 MG tablet Commonly known as:  ZOCOR Take 20 mg by mouth every evening.   sodium chloride HYPERTONIC 3 % nebulizer solution Take 4 mLs by nebulization 2 (two) times daily. What changed:  when to take this  additional instructions   warfarin 2 MG tablet Commonly known as:  COUMADIN Take 6 mg for 3 days. RN should come to your home to check the INR on Tuesday. What changed:  how much to take  how to take this  when to take this  additional instructions      Follow-up Information    Elias Else, MD Follow up.   Specialty:  Family Medicine Why:  in 1-2 wks Contact information: [address] [place] [address] [phone]        Merwyn Katos, MD Follow  up.   Specialty:  Pulmonary Disease Why:  in 3-4 wks or as needed Contact information: [address] [place] [address] [phone]          Allergies  Allergen Reactions  . Penicillins Rash    Has patient had a PCN reaction causing immediate rash, facial/tongue/throat swelling, SOB or lightheadedness with hypotension: Yes Has patient had a PCN reaction causing severe rash involving mucus membranes or skin necrosis: No Has patient had a PCN reaction that required hospitalization: No Has patient had a PCN reaction occurring within the last 10 years: No If all of the above answers are "NO", then may proceed with Cephalosporin use.   . Shellfish Allergy Anaphylaxis and Rash  . Hydrocodone Other (See Comments)    Pt stated "it's too high-powered, so it sends me to outer space"  . Oxycodone Other (See Comments)    Pt stated "it's too high-powered, so it sends me to outer space"  . Levaquin [Levofloxacin] Hives     Procedures/Studies:    Ct Abdomen Pelvis Wo Contrast  Result Date: 03/05/2017 CLINICAL DATA:  76 year old female with history of nausea and non bilious vomiting. EXAM: CT ABDOMEN AND PELVIS WITHOUT CONTRAST TECHNIQUE: Multidetector CT imaging of the abdomen and pelvis was performed following the standard protocol without IV contrast. COMPARISON:  CT the abdomen and pelvis 12/30/2016. 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 Lower chest: Widespread peribronchovascular micronodularity noted throughout the lung bases, increased compared to the prior study. There is some associated cylindrical bronchiectasis, most evident in the left lower lobe where there is also profound thickening of the peribronchovascular interstitium. Some airspace consolidation is also noted in the left lower lobe, concerning for pneumonia. Small left and trace right pleural effusions lying dependently. Dextrocardia. Status post median sternotomy for CABG. Hepatobiliary:  Left-sided liver. No definite cystic or solid hepatic lesions are confidently identified on today's noncontrast CT examination. Small amount of pneumobilia, presumably related to prior sphincterotomy, similar to the prior study. Status post cholecystectomy. Pancreas: No definite pancreatic mass or peripancreatic inflammatory changes are noted on today's noncontrast CT examination. Spleen: Right-sided spleen.  Otherwise, unremarkable. Adrenals/Urinary Tract: Unenhanced appearance of the kidneys and bilateral adrenal glands is normal. No hydroureteronephrosis. Urinary bladder  is unremarkable in appearance. Stomach/Bowel: Right-sided stomach. Appearance of the stomach is otherwise unremarkable. No pathologic dilatation of small bowel or colon. Status post appendectomy. Vascular/Lymphatic: Aortic atherosclerosis without definite aneurysm in the abdominal or pelvic vasculature. IVC filter in position with tip terminating shortly before the level of the renal veins. No lymphadenopathy noted in the abdomen or pelvis. Reproductive: Status posthysterectomy. Ovaries are not confidently identified may be surgically absent or atrophic. Other: No significant volume of ascites.  No pneumoperitoneum. Musculoskeletal: There are no aggressive appearing lytic or blastic lesions noted in the visualized portions of the skeleton. Chronic compression fracture of T12 again noted with 50% loss of anterior vertebral body height. New post vertebroplasty changes are noted at T12. There is also a new compression fracture at L1 with post vertebroplasty changes an approximately 50% loss of anterior vertebral body height. Median sternotomy wires. IMPRESSION: 1. No acute findings are noted in the abdomen or pelvis to account for the patient's symptoms. 2. 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 3. Trace bilateral pleural effusions. 4. Aortic atherosclerosis. 5. Situs inversus totalis 6. Additional incidental findings, as above. Aortic Atherosclerosis (ICD10-I70.0). Electronically Signed   By: Trudie Reed M.D.   On: 03/05/2017 21:57   Dg Chest 2 View  Result Date: 02/05/2017 CLINICAL DATA:  Back pain EXAM: CHEST  2 VIEW COMPARISON:  10/26/2016 FINDINGS: Bilateral lower lobe scarring/ atelectasis. No pleural effusion or pneumothorax. The heart is normal in size. Situs inversus. Postsurgical changes related to prior CABG. Median sternotomy. Thoracolumbar spine is described on dedicated radiographs. IVC filter. IMPRESSION: Thoracolumbar spine is described on dedicated radiographs. No evidence of acute cardiopulmonary disease. Electronically Signed   By: Charline Bills M.D.   On: 02/05/2017 20:31   Dg Thoracic Spine 2 View  Result Date: 02/05/2017 CLINICAL DATA:  Back pain, recent vertebroplasty EXAM: THORACIC SPINE 2 VIEWS COMPARISON:  MRI thoracic spine dated 12/31/2016 FINDINGS: Prior vertebral augmentation at T12. Mild degenerative changes of the thoracic spine. New superior endplate compression fracture deformity at L1 with approximately 30% loss of height. No retropulsion. IMPRESSION: New superior endplate compression fracture deformity at L1 with approximately 30% loss of height. No retropulsion. Prior vertebral augmentation at T12. Electronically Signed   By: Charline Bills M.D.   On: 02/05/2017 20:30   Dg Lumbar Spine 2-3 Views  Result Date: 02/18/2017 CLINICAL DATA:  Status post kyphoplasty EXAM: DG C-ARM 61-120 MIN; LUMBAR SPINE - 2-3 VIEW COMPARISON:  None FLUOROSCOPY TIME:  1 minutes 14 seconds FINDINGS: Interval L1 vertebral body augmentation with methylmethacrylate within the L1 vertebral body. Prior T12 vertebral body augmentation with methylmethacrylate within the T12 vertebral body.  IMPRESSION: 1. Interval L1 vertebral body augmentation with methylmethacrylate within the L1 vertebral body. Electronically Signed   By: Elige Ko   On: 02/18/2017 13:00   Dg Lumbar Spine 2-3 Views  Result Date: 02/05/2017 CLINICAL DATA:  Severe back pain, recent vertebroplasty EXAM: LUMBAR SPINE - 2-3 VIEW COMPARISON:  None. FINDINGS: Prior vertebral augmentation at T12. New superior endplate compression fracture deformity at L1 with approximately 30% loss of height. No retropulsion. Mild degenerative changes of the upper lumbar spine. IVC filter. IMPRESSION: New superior endplate compression fracture deformity at L1 with approximately 30% loss of height. No retropulsion. Electronically Signed   By: Lurlean Horns  Rito Ehrlich M.D.   On: 02/05/2017 20:32   Ct Head Wo Contrast  Result Date: 03/05/2017 CLINICAL DATA:  Dizziness and presyncope. Status post kyphoplasties February 18, 2017. History of hypertension, hyperlipidemia, diabetes, neuromuscular disorder. EXAM: CT HEAD WITHOUT CONTRAST TECHNIQUE: Contiguous axial images were obtained from the base of the skull through the vertex without intravenous contrast. COMPARISON:  None. FINDINGS: BRAIN: No intraparenchymal hemorrhage, mass effect nor midline shift. The ventricles and sulci are normal for age. Patchy supratentorial white matter hypodensities less than expected for patient's age, though non-specific are most compatible with chronic small vessel ischemic disease. 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 No acute large vascular territory infarcts. No abnormal extra-axial fluid collections. Basal cisterns are patent. VASCULAR: Moderate calcific atherosclerosis of the carotid siphons. SKULL: No skull fracture. Osteopenia. No significant scalp soft tissue swelling. SINUSES/ORBITS: Severe pan paranasal sinusitis. Bilateral middle ear and mastoid effusions without air cell coalescence. The included ocular globes and orbital contents are non-suspicious. Status post  bilateral ocular lens implants. OTHER: None. IMPRESSION: 1. 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 2. Severe chronic pan paranasal sinusitis. Bilateral middle ear and mastoid effusions. Electronically Signed   By: Awilda Metro M.D.   On: 03/05/2017 00:27   Ct Angio Chest Pe W And/or Wo Contrast  Result Date: 02/05/2017 CLINICAL DATA:  Back pain with recent surgery EXAM: CT ANGIOGRAPHY CHEST WITH CONTRAST TECHNIQUE: Multidetector CT imaging of the chest was performed using the standard protocol during bolus administration of intravenous contrast. Multiplanar CT image reconstructions and MIPs were obtained to evaluate the vascular anatomy. CONTRAST:  100 mL Isovue 370 intravenous COMPARISON:  Radiograph 02/05/2017, CT chest 05/26/2013, CT 12/30/2016 FINDINGS: Cardiovascular: Satisfactory opacification of the pulmonary arteries to the segmental level. No evidence of pulmonary embolism. Atherosclerotic calcifications of the aorta. No aneurysmal dilatation. Right-sided aortic arch with mirror image branching of the great vessels. No dissection is seen. Dextrocardia. Coronary artery calcification. No large pericardial effusion. Mediastinum/Nodes: Midline trachea. No thyroid. No significantly enlarged lymph nodes. Distal hiatal hernia. Lungs/Pleura: Bronchiectasis in the bilateral lower lobes with bronchial thickening and consolidation. Scattered ground-glass foci in the left upper lobe with mild tree-in-bud nodularity within the bilateral upper lobes and left greater than right lower lobes. No pleural effusion. No pneumothorax. Upper Abdomen:  Situs in versus.  Pneumobilia re- demonstrated. Musculoskeletal: Sternotomy changes. No acute or suspicious bone lesion Review of the MIP images confirms the above findings. IMPRESSION: 1. Negative for acute pulmonary embolus or aortic dissection 2. 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 Dextrocardia. 3. Bilateral lower lobe bronchiectasis with chronic areas of consolidation and bronchial thickening. Scattered ground-glass foci and tree-in-bud nodularity within the upper and lower lobes, compatible with respiratory infection, consider atypical organisms. 4. Situs  inversus.  Pneumobilia, postsurgical Aortic Atherosclerosis (ICD10-I70.0). Electronically Signed   By: Jasmine Pang M.D.   On: 02/05/2017 23:02   Dg C-arm 1-60 Min  Result Date: 02/18/2017 CLINICAL DATA:  Status post kyphoplasty EXAM: DG C-ARM 61-120 MIN; LUMBAR SPINE - 2-3 VIEW COMPARISON:  None FLUOROSCOPY TIME:  1 minutes 14 seconds FINDINGS: Interval L1 vertebral body augmentation with methylmethacrylate within the L1 vertebral body. Prior T12 vertebral body augmentation with methylmethacrylate within the T12 vertebral body. IMPRESSION: 1. Interval L1 vertebral body augmentation with methylmethacrylate within the L1 vertebral body. Electronically Signed   By: Elige Ko   On: 02/18/2017 13:00       Discharge  Exam: Vitals:   03/07/17 0831 03/07/17 0900  BP:  136/63  Pulse:  85  Resp:  18  Temp:  97.6 F (36.4 C)  SpO2: 93% 100%   Vitals:   03/06/17 2033 03/07/17 0423 03/07/17 0831 03/07/17 0900  BP: (!) 95/48 111/63  136/63  Pulse: 97 87  85  Resp: 18 18  18   Temp: 100.3 F (37.9 C) 98.5 F (36.9 C)  97.6 F (36.4 C)  TempSrc: Oral Oral  Oral  SpO2: 94% 99% 93% 100%  Weight: 68 kg (150 lb)     Height:        General: Pt is alert, awake, not in acute distress Cardiovascular: RRR, S1/S2 +, no rubs, no gallops Respiratory: CTA bilaterally, no wheezing, no rhonchi Abdominal: Soft, NT, ND, bowel sounds + Extremities: no edema, no cyanosis    The results of significant diagnostics from this hospitalization (including imaging, microbiology, ancillary and laboratory) are listed below for reference.     Microbiology: Recent Results (from the past 240 hour(s))  Culture,  expectorated sputum-assessment     Status: None   Collection Time: 03/07/17 10:01 AM  Result Value Ref Range Status   Specimen Description EXPECTORATED SPUTUM  Final   Special Requests Immunocompromised  Final   Sputum evaluation THIS SPECIMEN IS ACCEPTABLE FOR SPUTUM CULTURE  Final   Report Status 03/07/2017 FINAL  Final     Labs: BNP (last 3 results)  Recent Labs  02/05/17 1707  BNP 39.5   Basic Metabolic Panel:  Recent Labs Lab 03/04/17 2030 03/05/17 0538 03/06/17 0337 03/07/17 0333  NA 134* 136 137 139  K 2.3* 3.7 3.5 4.1  CL 85* 93* 97* 101  CO2 37* 36* 34* 32  GLUCOSE 98 75 86 106*  BUN 11 11 7 11   CREATININE 1.05* 0.96 0.81 0.87  CALCIUM 8.5* 7.9* 7.9* 7.9*  MG 1.6* 2.2  --   --    Liver Function Tests:  Recent Labs Lab 03/04/17 2030 03/05/17 0538  AST 21 27  ALT 15 11*  ALKPHOS 93 75  BILITOT 1.2 1.7*  PROT 6.3* 5.2*  ALBUMIN 2.3* 2.1*   No results for input(s): LIPASE, AMYLASE in the last 168 hours. No results for input(s): AMMONIA in the last 168 hours. CBC:  Recent Labs Lab 03/04/17 2030 03/05/17 0815 03/06/17 0337  WBC 16.7* 9.4 8.4  NEUTROABS 10.3*  --   --   HGB 11.2* 10.2* 9.0*  HCT 35.4* 32.1* 29.0*  MCV 100.6* 102.2* 103.2*  PLT 308 213 263   Cardiac Enzymes:  Recent Labs Lab 03/04/17 2030 03/05/17 0853  TROPONINI <0.03 <0.03   BNP: Invalid input(s): POCBNP CBG:  Recent Labs Lab 03/06/17 1137 03/06/17 1621 03/06/17 2018 03/07/17 0753 03/07/17 1155  GLUCAP 95 138* 145* 97 108*   D-Dimer No results for input(s): DDIMER in the last 72 hours. Hgb A1c No results for input(s): HGBA1C in the last 72 hours. Lipid Profile No results for input(s): CHOL, HDL, LDLCALC, TRIG, CHOLHDL, LDLDIRECT in the last 72 hours. Thyroid function studies  Recent Labs  03/05/17 0853  TSH 3.140   Anemia work up No results for input(s): VITAMINB12, FOLATE, FERRITIN, TIBC, IRON, RETICCTPCT in the last 72 hours. Urinalysis     Component Value Date/Time   COLORURINE YELLOW 03/04/2017 2015   APPEARANCEUR CLEAR 03/04/2017 2015   APPEARANCEUR Hazy 05/25/2013 1613   LABSPEC 1.005 03/04/2017 2015   LABSPEC 1.009 05/25/2013 1613   PHURINE 7.0 03/04/2017 2015  GLUCOSEU NEGATIVE 03/04/2017 2015   GLUCOSEU Negative 05/25/2013 1613   HGBUR NEGATIVE 03/04/2017 2015   BILIRUBINUR NEGATIVE 03/04/2017 2015   BILIRUBINUR Negative 05/25/2013 1613   KETONESUR 5 (A) 03/04/2017 2015   PROTEINUR NEGATIVE 03/04/2017 2015   UROBILINOGEN 1.0 08/12/2011 2028   NITRITE NEGATIVE 03/04/2017 2015   LEUKOCYTESUR TRACE (A) 03/04/2017 2015   LEUKOCYTESUR 1+ 05/25/2013 1613   Sepsis Labs Invalid input(s): PROCALCITONIN,  WBC,  LACTICIDVEN Microbiology Recent Results (from the past 240 hour(s))  Culture, expectorated sputum-assessment     Status: None   Collection Time: 03/07/17 10:01 AM  Result Value Ref Range Status   Specimen Description EXPECTORATED SPUTUM  Final   Special Requests Immunocompromised  Final   Sputum evaluation THIS SPECIMEN IS ACCEPTABLE FOR SPUTUM CULTURE  Final   Report Status 03/07/2017 FINAL  Final     Time coordinating discharge: Over 30 minutes  SIGNED:   Calvert Cantor, MD  [facility] 03/07/2017, 12:34 PM Pager   If 7PM-7AM, please contact night-coverage [email or web address] Password [email or web address]

## 2017-03-07 NOTE — Discharge Instructions (Signed)
Please see your PCP in 1-2 wks. Do not eat acidic food, take too much caffiene or take Motrin or Naprosyn for now. Your PCP will continue to assist in finding an ALF if that is what you choose to do.   Please take all your medications with you for your next visit with your Primary MD. Please request your Primary MD to go over all hospital test results at the follow up. Please ask your Primary MD to get all Hospital records sent to his/her office.  If you experience worsening of your admission symptoms, develop shortness of breath, chest pain, suicidal or homicidal thoughts or a life threatening emergency, you must seek medical attention immediately by calling 911 or calling your MD.  Bonita Quin must read the complete instructions/literature along with all the possible adverse reactions/side effects for all the medicines you take including new medications that have been prescribed to you. Take new medicines after you have completely understood and accpet all the possible adverse reactions/side effects.   Do not drive when taking pain medications or sedatives.    Do not take more than prescribed Pain, Sleep and Anxiety Medications  If you have smoked or chewed Tobacco in the last 2 yrs please stop. Stop any regular alcohol and or recreational drug use.  Wear Seat belts while driving.

## 2017-03-07 NOTE — Progress Notes (Addendum)
ANTICOAGULATION CONSULT NOTE - Follow-Up Pharmacy Consult for Coumadin Indication: Hx PE/DVT  Patient Measurements: Height: 5\' 2"  (157.5 cm) Weight: 150 lb (68 kg) IBW/kg (Calculated) : 50.1  Vital Signs: Temp: 97.6 F (36.4 C) (10/04 0900) Temp Source: Oral (10/04 0900) BP: 136/63 (10/04 0900) Pulse Rate: 85 (10/04 0900)  Labs:  Recent Labs  03/04/17 2030  03/05/17 0538 03/05/17 0815 03/05/17 0853 03/05/17 1200 03/06/17 0337 03/07/17 0333  HGB 11.2*  --   --  10.2*  --   --  9.0*  --   HCT 35.4*  --   --  32.1*  --   --  29.0*  --   PLT 308  --   --  213  --   --  263  --   LABPROT  --   < > 28.2*  --   --  33.4* 28.5* 25.4*  INR  --   < > SPECIMEN CLOTTED  --   --  3.32 2.71 2.33  CREATININE 1.05*  --  0.96  --   --   --  0.81 0.87  TROPONINI <0.03  --   --   --  <0.03  --   --   --   < > = values in this interval not displayed.  Estimated Creatinine Clearance: 51.3 mL/min (by C-G formula based on SCr of 0.87 mg/dL).  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 Admit INR 4.1. The patient's PTA dose had recently been temporarily increased after a SUBtherapeutic INR outpatient on 9/26 of 1.8 however the patient has been noted to have therapeutic INRs on her PTA dose previously. PTA dose is 2 mg daily EXCEPT for 4 mg on Mon/Fri.   INR today remains therapeutic thought still trending down (INR 2.33 << 2.71, goal of 2-3). No CBC today - no overt bleeding noted at this time.    Goal of Therapy:  INR 2-3 Monitor platelets by anticoagulation protocol: Yes   Plan:  1. Warfarin 2 mg x 1 dose at 1800 today 2. Will continue to monitor for any signs/symptoms of bleeding and will follow up with PT/INR in the a.m.   Thank you for allowing pharmacy to be a part of this patient's care.  06-17-2003, PharmD, BCPS Clinical Pharmacist Pager: [phone] Clinical phone for 03/07/2017 from 7a-3:30p: [phone] If after 3:30p, please  call main pharmacy at: [phone] 03/07/2017 10:51 AM

## 2017-03-07 NOTE — Progress Notes (Signed)
Patient discharged to home via daughter-in-law. O2 with patient. IV x 2 removed. Telemetry removed. All discharge instructions and scripts reviewed. Patient left unit via wheelchair in stable condition.  Avelina Laine RN

## 2017-03-07 NOTE — Care Management Note (Addendum)
Case Management Note  Patient Details  Name: [name] MRN: [id number] Date of Birth: 1941-01-14  Subjective/Objective:     CM following for progression and d/c planning.                Action/Plan: 03/07/2017 Spoke with pt daughter, Ms Philis Kendall per her request re [facility] services. [facility] is resuming HH services, with new orders for HHRN, HHPT, HHOT, HH aide and Child psychotherapist. This CM contacted [facility] and spoke with Adasha re these services. Per daughter they did not think a HH aide had been making visits. This CM varified with Adasha that this is ordered and per pt daughter this pt has a fear of using her bath tub and kitchen stove. This CM requested that [facility] HHOT services focus on these needs when visiting this pt . Daughter was called back and informed of conversation with [facility].   Expected Discharge Date:  03/07/17               Expected Discharge Plan:  Home w Home Health Services  In-House Referral:     Discharge planning Services  CM Consult  Post Acute Care Choice:  Home Health Choice offered to:  Adult Children  DME Arranged:  N/A DME Agency:     HH Arranged:  RN, PT, OT, Nurse's Aide, Social Work [employer] Agency:  [facility]  Status of Service:  Completed, signed off  If discussed at [employer], dates discussed:    Additional Comments:  Starlyn Skeans, RN 03/07/2017, 2:49 PM

## 2017-03-07 NOTE — Progress Notes (Signed)
Patient has an old healed ulcer on sacram. New foam dressing applied.

## 2017-03-08 ENCOUNTER — Other Ambulatory Visit: Payer: Self-pay | Admitting: *Deleted

## 2017-03-08 ENCOUNTER — Ambulatory Visit: Payer: Self-pay | Admitting: *Deleted

## 2017-03-08 NOTE — Patient Outreach (Signed)
[facility]) Care Management  03/08/2017  [name] 1940-09-24 [id number]  Transition of care call Discharged on 10/4.  1353 Placed call to patient as part of transition of care program, no answer unable to leave a message.    1640  Placed call to patient daughter Godfrey Pick., HIPAA information verified, explained reason for the call, and transition of care program.  Bjorn Loser explains that patient is staying with her for the next few days due to extermination being done at patient apartment, anticipate patient returning to her home by Monday. Daughter states patient has all of medical equipment, oxygen, nebulizer, concentrator. Daughter unable to review full discharge medication list at this time, but states patient has everything on the list. Patient to continue usual coumadin dose and get INR check by home health RN on 10/9. Daughter discussed patient had post procedure visit with Dr.Menz prior to admission and has cleared to increase activity, daughter voiced concern regarding patient being hesitant to progress with activity.  [facility] to be resumed, RN, PT/OT and bath aide,daughter hopes this will increased patients' confidence as begins therapy.   She also discussed patient may be a little depressed  and she will discuss with PCP. Daughter discussed she is trying to get patient more involved with social activities at church.   Daughter plans to call and arrange PCP and Pulmonary post discharge visits.  Daughter denies any other concerns, reports she and sister in law  will use pill organizer for patient daily medications to make sure she is taking them as directed,  discussed [facility] pharmacist available for concern related to medications, she declines needs at this time, but will let us know if new concerns .     Plan  Will follow patient for transition of care program, next call in a week to complete transition of care assessments.   Egbert Garibaldi, RN, [facility] Care Management,Care Management Coordinator  [phone]- Mobile [phone]- Toll Free Main Office

## 2017-03-09 DIAGNOSIS — J9611 Chronic respiratory failure with hypoxia: Secondary | ICD-10-CM | POA: Diagnosis not present

## 2017-03-09 DIAGNOSIS — J479 Bronchiectasis, uncomplicated: Secondary | ICD-10-CM | POA: Diagnosis not present

## 2017-03-09 DIAGNOSIS — J984 Other disorders of lung: Secondary | ICD-10-CM | POA: Diagnosis not present

## 2017-03-09 DIAGNOSIS — J449 Chronic obstructive pulmonary disease, unspecified: Secondary | ICD-10-CM | POA: Diagnosis not present

## 2017-03-11 ENCOUNTER — Ambulatory Visit (INDEPENDENT_AMBULATORY_CARE_PROVIDER_SITE_OTHER): Payer: Medicare HMO | Admitting: Internal Medicine

## 2017-03-11 DIAGNOSIS — E1140 Type 2 diabetes mellitus with diabetic neuropathy, unspecified: Secondary | ICD-10-CM | POA: Diagnosis not present

## 2017-03-11 DIAGNOSIS — I5022 Chronic systolic (congestive) heart failure: Secondary | ICD-10-CM | POA: Diagnosis not present

## 2017-03-11 DIAGNOSIS — L89312 Pressure ulcer of right buttock, stage 2: Secondary | ICD-10-CM | POA: Diagnosis not present

## 2017-03-11 DIAGNOSIS — M1990 Unspecified osteoarthritis, unspecified site: Secondary | ICD-10-CM | POA: Diagnosis not present

## 2017-03-11 DIAGNOSIS — M4854XD Collapsed vertebra, not elsewhere classified, thoracic region, subsequent encounter for fracture with routine healing: Secondary | ICD-10-CM | POA: Diagnosis not present

## 2017-03-11 DIAGNOSIS — I2510 Atherosclerotic heart disease of native coronary artery without angina pectoris: Secondary | ICD-10-CM | POA: Diagnosis not present

## 2017-03-11 DIAGNOSIS — L89322 Pressure ulcer of left buttock, stage 2: Secondary | ICD-10-CM | POA: Diagnosis not present

## 2017-03-11 DIAGNOSIS — Z5181 Encounter for therapeutic drug level monitoring: Secondary | ICD-10-CM

## 2017-03-11 DIAGNOSIS — M109 Gout, unspecified: Secondary | ICD-10-CM | POA: Diagnosis not present

## 2017-03-11 DIAGNOSIS — I110 Hypertensive heart disease with heart failure: Secondary | ICD-10-CM | POA: Diagnosis not present

## 2017-03-11 LAB — CULTURE, RESPIRATORY W GRAM STAIN

## 2017-03-11 LAB — CULTURE, RESPIRATORY

## 2017-03-11 LAB — POCT INR: INR: 1.4

## 2017-03-12 ENCOUNTER — Ambulatory Visit: Payer: Self-pay | Admitting: *Deleted

## 2017-03-12 ENCOUNTER — Other Ambulatory Visit: Payer: Self-pay | Admitting: *Deleted

## 2017-03-13 ENCOUNTER — Other Ambulatory Visit: Payer: Self-pay | Admitting: *Deleted

## 2017-03-13 DIAGNOSIS — E876 Hypokalemia: Secondary | ICD-10-CM | POA: Diagnosis not present

## 2017-03-13 DIAGNOSIS — S32010D Wedge compression fracture of first lumbar vertebra, subsequent encounter for fracture with routine healing: Secondary | ICD-10-CM | POA: Diagnosis not present

## 2017-03-13 DIAGNOSIS — I471 Supraventricular tachycardia: Secondary | ICD-10-CM | POA: Diagnosis not present

## 2017-03-13 DIAGNOSIS — F321 Major depressive disorder, single episode, moderate: Secondary | ICD-10-CM | POA: Diagnosis not present

## 2017-03-13 DIAGNOSIS — E46 Unspecified protein-calorie malnutrition: Secondary | ICD-10-CM | POA: Diagnosis not present

## 2017-03-13 NOTE — Patient Outreach (Signed)
[facility]) Care Management  03/13/2017  [name] [birth year] [id number]  Transition of care call  Placed call to patient, no answer, unable to leave a message .  Plan  Will  await return call, if no response will attempt call later today or next business day.    Egbert Garibaldi, RN, [facility] Care Management,Care Management Coordinator  [phone]- Mobile [phone]- Toll Free Main Office

## 2017-03-14 ENCOUNTER — Other Ambulatory Visit: Payer: Self-pay | Admitting: *Deleted

## 2017-03-14 ENCOUNTER — Ambulatory Visit: Payer: Self-pay | Admitting: *Deleted

## 2017-03-14 NOTE — Patient Outreach (Signed)
[facility]) Care Management  03/14/2017  [name] 06-08-1940 [id number]   Transition of care call  Placed call to patient , no answer, unable to leave a message.     Plan Return call in the next week, if no answer will plan outreach to patient daughter.    Egbert Garibaldi, RN, [facility] Care Management,Care Management Coordinator  [phone]- Mobile [phone]- Toll Free Main Office

## 2017-03-15 ENCOUNTER — Encounter (HOSPITAL_BASED_OUTPATIENT_CLINIC_OR_DEPARTMENT_OTHER): Payer: Medicare HMO | Attending: Internal Medicine

## 2017-03-15 DIAGNOSIS — I110 Hypertensive heart disease with heart failure: Secondary | ICD-10-CM | POA: Diagnosis not present

## 2017-03-15 DIAGNOSIS — Z86711 Personal history of pulmonary embolism: Secondary | ICD-10-CM | POA: Insufficient documentation

## 2017-03-15 DIAGNOSIS — Z6824 Body mass index (BMI) 24.0-24.9, adult: Secondary | ICD-10-CM | POA: Diagnosis not present

## 2017-03-15 DIAGNOSIS — Z9981 Dependence on supplemental oxygen: Secondary | ICD-10-CM | POA: Diagnosis not present

## 2017-03-15 DIAGNOSIS — Z951 Presence of aortocoronary bypass graft: Secondary | ICD-10-CM | POA: Diagnosis not present

## 2017-03-15 DIAGNOSIS — L89322 Pressure ulcer of left buttock, stage 2: Secondary | ICD-10-CM | POA: Diagnosis not present

## 2017-03-15 DIAGNOSIS — E1122 Type 2 diabetes mellitus with diabetic chronic kidney disease: Secondary | ICD-10-CM | POA: Insufficient documentation

## 2017-03-15 DIAGNOSIS — I5022 Chronic systolic (congestive) heart failure: Secondary | ICD-10-CM | POA: Diagnosis not present

## 2017-03-15 DIAGNOSIS — I2510 Atherosclerotic heart disease of native coronary artery without angina pectoris: Secondary | ICD-10-CM | POA: Insufficient documentation

## 2017-03-15 DIAGNOSIS — L89312 Pressure ulcer of right buttock, stage 2: Secondary | ICD-10-CM | POA: Diagnosis not present

## 2017-03-15 DIAGNOSIS — E43 Unspecified severe protein-calorie malnutrition: Secondary | ICD-10-CM | POA: Insufficient documentation

## 2017-03-15 DIAGNOSIS — L89151 Pressure ulcer of sacral region, stage 1: Secondary | ICD-10-CM | POA: Insufficient documentation

## 2017-03-15 DIAGNOSIS — Q893 Situs inversus: Secondary | ICD-10-CM | POA: Diagnosis not present

## 2017-03-15 DIAGNOSIS — M1990 Unspecified osteoarthritis, unspecified site: Secondary | ICD-10-CM | POA: Diagnosis not present

## 2017-03-15 DIAGNOSIS — M4854XD Collapsed vertebra, not elsewhere classified, thoracic region, subsequent encounter for fracture with routine healing: Secondary | ICD-10-CM | POA: Diagnosis not present

## 2017-03-15 DIAGNOSIS — M109 Gout, unspecified: Secondary | ICD-10-CM | POA: Diagnosis not present

## 2017-03-15 DIAGNOSIS — S31000A Unspecified open wound of lower back and pelvis without penetration into retroperitoneum, initial encounter: Secondary | ICD-10-CM | POA: Diagnosis not present

## 2017-03-15 DIAGNOSIS — N183 Chronic kidney disease, stage 3 (moderate): Secondary | ICD-10-CM | POA: Insufficient documentation

## 2017-03-15 DIAGNOSIS — E1140 Type 2 diabetes mellitus with diabetic neuropathy, unspecified: Secondary | ICD-10-CM | POA: Diagnosis not present

## 2017-03-18 ENCOUNTER — Ambulatory Visit (INDEPENDENT_AMBULATORY_CARE_PROVIDER_SITE_OTHER): Payer: Medicare HMO | Admitting: Cardiovascular Disease

## 2017-03-18 ENCOUNTER — Other Ambulatory Visit: Payer: Self-pay | Admitting: *Deleted

## 2017-03-18 ENCOUNTER — Ambulatory Visit: Payer: Self-pay | Admitting: *Deleted

## 2017-03-18 DIAGNOSIS — Z5181 Encounter for therapeutic drug level monitoring: Secondary | ICD-10-CM | POA: Diagnosis not present

## 2017-03-18 DIAGNOSIS — I2692 Saddle embolus of pulmonary artery without acute cor pulmonale: Secondary | ICD-10-CM

## 2017-03-18 DIAGNOSIS — E1140 Type 2 diabetes mellitus with diabetic neuropathy, unspecified: Secondary | ICD-10-CM | POA: Diagnosis not present

## 2017-03-18 DIAGNOSIS — L89322 Pressure ulcer of left buttock, stage 2: Secondary | ICD-10-CM | POA: Diagnosis not present

## 2017-03-18 DIAGNOSIS — M4854XD Collapsed vertebra, not elsewhere classified, thoracic region, subsequent encounter for fracture with routine healing: Secondary | ICD-10-CM | POA: Diagnosis not present

## 2017-03-18 DIAGNOSIS — M109 Gout, unspecified: Secondary | ICD-10-CM | POA: Diagnosis not present

## 2017-03-18 DIAGNOSIS — I110 Hypertensive heart disease with heart failure: Secondary | ICD-10-CM | POA: Diagnosis not present

## 2017-03-18 DIAGNOSIS — I5022 Chronic systolic (congestive) heart failure: Secondary | ICD-10-CM | POA: Diagnosis not present

## 2017-03-18 DIAGNOSIS — L89312 Pressure ulcer of right buttock, stage 2: Secondary | ICD-10-CM | POA: Diagnosis not present

## 2017-03-18 DIAGNOSIS — M1990 Unspecified osteoarthritis, unspecified site: Secondary | ICD-10-CM | POA: Diagnosis not present

## 2017-03-18 DIAGNOSIS — I2510 Atherosclerotic heart disease of native coronary artery without angina pectoris: Secondary | ICD-10-CM | POA: Diagnosis not present

## 2017-03-18 LAB — POCT INR: INR: 2.1

## 2017-03-19 ENCOUNTER — Other Ambulatory Visit: Payer: Self-pay | Admitting: *Deleted

## 2017-03-19 NOTE — Patient Outreach (Signed)
[facility]) Care Management  03/19/2017  [name] 1940/11/23 [id number]  Transition of care call  Placed weekly transition of care call to patient, no answer unable to leave a message.   Placed call to [facility], to follow up on patient progress able to leave a hipaa compliant message with Erie Noe RN requesting return call. Placed call to patient daughter [name], able to leave a hipaa compliant message requesting a return call.     Plan  Await return call if no response with attempt return call later in week   Egbert Garibaldi, RN, [facility] Care Management,Care Management Coordinator  [phone]- Mobile [phone]- Toll Free Main Office

## 2017-03-20 DIAGNOSIS — M4854XD Collapsed vertebra, not elsewhere classified, thoracic region, subsequent encounter for fracture with routine healing: Secondary | ICD-10-CM | POA: Diagnosis not present

## 2017-03-20 DIAGNOSIS — I110 Hypertensive heart disease with heart failure: Secondary | ICD-10-CM | POA: Diagnosis not present

## 2017-03-20 DIAGNOSIS — M1990 Unspecified osteoarthritis, unspecified site: Secondary | ICD-10-CM | POA: Diagnosis not present

## 2017-03-20 DIAGNOSIS — L89312 Pressure ulcer of right buttock, stage 2: Secondary | ICD-10-CM | POA: Diagnosis not present

## 2017-03-20 DIAGNOSIS — E1140 Type 2 diabetes mellitus with diabetic neuropathy, unspecified: Secondary | ICD-10-CM | POA: Diagnosis not present

## 2017-03-20 DIAGNOSIS — M109 Gout, unspecified: Secondary | ICD-10-CM | POA: Diagnosis not present

## 2017-03-20 DIAGNOSIS — I5022 Chronic systolic (congestive) heart failure: Secondary | ICD-10-CM | POA: Diagnosis not present

## 2017-03-20 DIAGNOSIS — I2510 Atherosclerotic heart disease of native coronary artery without angina pectoris: Secondary | ICD-10-CM | POA: Diagnosis not present

## 2017-03-20 DIAGNOSIS — L89322 Pressure ulcer of left buttock, stage 2: Secondary | ICD-10-CM | POA: Diagnosis not present

## 2017-03-21 ENCOUNTER — Ambulatory Visit: Payer: Self-pay | Admitting: *Deleted

## 2017-03-21 DIAGNOSIS — I213 ST elevation (STEMI) myocardial infarction of unspecified site: Secondary | ICD-10-CM | POA: Diagnosis not present

## 2017-03-21 DIAGNOSIS — I130 Hypertensive heart and chronic kidney disease with heart failure and stage 1 through stage 4 chronic kidney disease, or unspecified chronic kidney disease: Secondary | ICD-10-CM | POA: Diagnosis not present

## 2017-03-21 DIAGNOSIS — M4855XD Collapsed vertebra, not elsewhere classified, thoracolumbar region, subsequent encounter for fracture with routine healing: Secondary | ICD-10-CM | POA: Diagnosis not present

## 2017-03-21 DIAGNOSIS — E1122 Type 2 diabetes mellitus with diabetic chronic kidney disease: Secondary | ICD-10-CM | POA: Diagnosis not present

## 2017-03-21 DIAGNOSIS — I5022 Chronic systolic (congestive) heart failure: Secondary | ICD-10-CM | POA: Diagnosis not present

## 2017-03-21 DIAGNOSIS — L89312 Pressure ulcer of right buttock, stage 2: Secondary | ICD-10-CM | POA: Diagnosis not present

## 2017-03-21 DIAGNOSIS — L89322 Pressure ulcer of left buttock, stage 2: Secondary | ICD-10-CM | POA: Diagnosis not present

## 2017-03-21 DIAGNOSIS — M069 Rheumatoid arthritis, unspecified: Secondary | ICD-10-CM | POA: Diagnosis not present

## 2017-03-21 DIAGNOSIS — N183 Chronic kidney disease, stage 3 (moderate): Secondary | ICD-10-CM | POA: Diagnosis not present

## 2017-03-22 ENCOUNTER — Other Ambulatory Visit: Payer: Self-pay | Admitting: *Deleted

## 2017-03-22 NOTE — Patient Outreach (Signed)
[facility]) Care Management  03/22/2017  [name] 05/31/1941 [id number]   Transition of care call  Placed call to patient no answer, unable to leave a message, "requesting enter remote access code"   Plan Will plan weekly  transition of care outreach call in the next week, third week of transition of care .   Egbert Garibaldi, RN, [facility] Care Management,Care Management Coordinator  [phone]- Mobile [phone]- Toll Free Main Office

## 2017-03-25 ENCOUNTER — Ambulatory Visit (INDEPENDENT_AMBULATORY_CARE_PROVIDER_SITE_OTHER): Payer: Medicare HMO | Admitting: Cardiology

## 2017-03-25 DIAGNOSIS — I213 ST elevation (STEMI) myocardial infarction of unspecified site: Secondary | ICD-10-CM | POA: Diagnosis not present

## 2017-03-25 DIAGNOSIS — Z5181 Encounter for therapeutic drug level monitoring: Secondary | ICD-10-CM

## 2017-03-25 DIAGNOSIS — E1122 Type 2 diabetes mellitus with diabetic chronic kidney disease: Secondary | ICD-10-CM | POA: Diagnosis not present

## 2017-03-25 DIAGNOSIS — N183 Chronic kidney disease, stage 3 (moderate): Secondary | ICD-10-CM | POA: Diagnosis not present

## 2017-03-25 DIAGNOSIS — L89312 Pressure ulcer of right buttock, stage 2: Secondary | ICD-10-CM | POA: Diagnosis not present

## 2017-03-25 DIAGNOSIS — I2692 Saddle embolus of pulmonary artery without acute cor pulmonale: Secondary | ICD-10-CM | POA: Diagnosis not present

## 2017-03-25 DIAGNOSIS — M069 Rheumatoid arthritis, unspecified: Secondary | ICD-10-CM | POA: Diagnosis not present

## 2017-03-25 DIAGNOSIS — I5022 Chronic systolic (congestive) heart failure: Secondary | ICD-10-CM | POA: Diagnosis not present

## 2017-03-25 DIAGNOSIS — M4855XD Collapsed vertebra, not elsewhere classified, thoracolumbar region, subsequent encounter for fracture with routine healing: Secondary | ICD-10-CM | POA: Diagnosis not present

## 2017-03-25 DIAGNOSIS — L89322 Pressure ulcer of left buttock, stage 2: Secondary | ICD-10-CM | POA: Diagnosis not present

## 2017-03-25 DIAGNOSIS — I130 Hypertensive heart and chronic kidney disease with heart failure and stage 1 through stage 4 chronic kidney disease, or unspecified chronic kidney disease: Secondary | ICD-10-CM | POA: Diagnosis not present

## 2017-03-25 LAB — POCT INR: INR: 2

## 2017-03-27 ENCOUNTER — Other Ambulatory Visit: Payer: Self-pay | Admitting: *Deleted

## 2017-03-27 DIAGNOSIS — N183 Chronic kidney disease, stage 3 (moderate): Secondary | ICD-10-CM | POA: Diagnosis not present

## 2017-03-27 DIAGNOSIS — M069 Rheumatoid arthritis, unspecified: Secondary | ICD-10-CM | POA: Diagnosis not present

## 2017-03-27 DIAGNOSIS — L89312 Pressure ulcer of right buttock, stage 2: Secondary | ICD-10-CM | POA: Diagnosis not present

## 2017-03-27 DIAGNOSIS — M4855XD Collapsed vertebra, not elsewhere classified, thoracolumbar region, subsequent encounter for fracture with routine healing: Secondary | ICD-10-CM | POA: Diagnosis not present

## 2017-03-27 DIAGNOSIS — I130 Hypertensive heart and chronic kidney disease with heart failure and stage 1 through stage 4 chronic kidney disease, or unspecified chronic kidney disease: Secondary | ICD-10-CM | POA: Diagnosis not present

## 2017-03-27 DIAGNOSIS — L89322 Pressure ulcer of left buttock, stage 2: Secondary | ICD-10-CM | POA: Diagnosis not present

## 2017-03-27 DIAGNOSIS — I213 ST elevation (STEMI) myocardial infarction of unspecified site: Secondary | ICD-10-CM | POA: Diagnosis not present

## 2017-03-27 DIAGNOSIS — I5022 Chronic systolic (congestive) heart failure: Secondary | ICD-10-CM | POA: Diagnosis not present

## 2017-03-27 DIAGNOSIS — E1122 Type 2 diabetes mellitus with diabetic chronic kidney disease: Secondary | ICD-10-CM | POA: Diagnosis not present

## 2017-03-27 NOTE — Patient Outreach (Signed)
[facility]) Care Management  03/27/2017  Raley Novicki Vana 1941/04/02 [id number]   Transition of care call -  Placed call to patient contact number , no answer unable to leave a message.  3rd unsuccessful outreach. Will attempt to contact patient daughter listed on [facility] consent again.  Placed call to [facility] home health able to speak with Laney Pastor, reports patient was visited on 10/22 for INR check and she was doing well. She reports patient wound on sacral area has healed.  Patient is followed by home health Physical therapy twice weekly at this time . Judeth Cornfield discussed difficulty getting in touch with patient by telephone at times.   Unable to complete transition of care flow sheet, and documentation due to limited contact with patient.  Plan Will await return call from patient daughter , if no response will send outreach letter per workflow    Egbert Garibaldi, RN, [facility] Care Management,Care Management Coordinator  [phone]- Mobile [phone]- Toll Free Main Office

## 2017-03-28 NOTE — Patient Outreach (Signed)
[facility]) Care Management  [facility] Social Work  03/27/2017  [name] 09/16/1940 [id number]  Subjective:  Patient reports she is home and receiving HH therapies.   Objective:  CSW to assist patient with commuity based resources to aide in her well-being, quality of life and overall safety/needs.    Current Medications:  Current Outpatient Prescriptions  Medication Sig Dispense Refill  . acetaminophen (TYLENOL) 160 MG/5ML elixir Take 320 mg by mouth every 6 (six) hours as needed for pain.     Marland Kitchen alendronate (FOSAMAX) 70 MG tablet Take 70 mg by mouth every Sunday.     Marland Kitchen allopurinol (ZYLOPRIM) 100 MG tablet Take 100 mg by mouth 2 (two) times daily.     Marland Kitchen aspirin EC 81 MG tablet Take 81 mg by mouth every morning.     . calcium carbonate (TUMS) 500 MG chewable tablet Chew 1 tablet (200 mg of elemental calcium total) by mouth daily. 90 tablet 3  . cholecalciferol (VITAMIN D) 1000 UNITS tablet Take 1,000 Units by mouth every morning.     . collagenase (SANTYL) ointment Apply topically daily. (Patient taking differently: Apply 1 application topically daily. TO AFFECTED AREA) 15 g 0  . feeding supplement, ENSURE ENLIVE, (ENSURE ENLIVE) LIQD Take 237 mLs by mouth 3 (three) times daily between meals. 90 Bottle 12  . folic acid (FOLVITE) 800 MCG tablet Take 800 mcg by mouth every morning.     . furosemide (LASIX) 40 MG tablet Take 1 tablet (40 mg total) by mouth daily as needed for fluid or edema. Take only if you gain 2-3 lb of fluid in a 24 hr period (Patient taking differently: Take 40 mg by mouth every morning. ) 90 tablet 3  . gabapentin (NEURONTIN) 300 MG capsule Take 300 mg by mouth 2 (two) times daily.      Marland Kitchen guaiFENesin (MUCINEX) 600 MG 12 hr tablet Take 1 tablet (600 mg total) by mouth 2 (two) times daily. (Patient taking differently: Take 600 mg by mouth 2 (two) times daily as needed for cough or to loosen phlegm. ) 60 tablet 2  . guaiFENesin (ROBITUSSIN) 100  MG/5ML SOLN Take 10 mLs (200 mg total) by mouth 3 (three) times daily. (Patient taking differently: Take 10 mLs by mouth 2 (two) times daily. ) 1200 mL 0  . HYDROmorphone (DILAUDID) 2 MG tablet Take 0.5 tablets (1 mg total) by mouth every 4 (four) hours as needed for severe pain. (Patient not taking: Reported on 03/04/2017) 30 tablet 0  . ipratropium-albuterol (DUONEB) 0.5-2.5 (3) MG/3ML SOLN Take 3 mLs by nebulization every 6 (six) hours as needed. (Patient taking differently: Take 3 mLs by nebulization every 6 (six) hours as needed (for shortness of breath or wheezing). ) 360 mL 5  . metFORMIN (GLUCOPHAGE-XR) 500 MG 24 hr tablet Take 500 mg by mouth 2 (two) times a week.     . methotrexate (RHEUMATREX) 2.5 MG tablet Take 20 mg by mouth every Monday.     . metoprolol tartrate (LOPRESSOR) 25 MG tablet Take 25 mg by mouth 2 (two) times daily.    Marland Kitchen omeprazole (PRILOSEC) 20 MG capsule Take 2 capsules (40 mg total) by mouth every morning. 30 capsule 0  . polyethylene glycol (MIRALAX / GLYCOLAX) packet Take 17 g by mouth daily as needed for mild constipation. (Patient not taking: Reported on 03/04/2017) 14 each 0  . Respiratory Therapy Supplies (FLUTTER) DEVI Use 10-15 times daily 1 each 0  . simvastatin (ZOCOR) 20  MG tablet Take 20 mg by mouth every evening.     . sodium chloride HYPERTONIC 3 % nebulizer solution Take 4 mLs by nebulization 2 (two) times daily. (Patient taking differently: Take 4 mLs by nebulization See admin instructions. ONE TO TWO TIMES A DAY) 750 mL 12  . warfarin (COUMADIN) 2 MG tablet Take 6 mg for 3 days. RN should come to your home to check the INR on Tuesday. (Patient taking differently: Take 2-4 mg by mouth See admin instructions. 2 mg in the evening on Sun/Tues/Wed/Thurs/Sat and 4 mg on Mon/Fri) 65 tablet 3   No current facility-administered medications for this visit.     Functional Status:  In your present state of health, do you have any difficulty performing the following  activities: 03/05/2017 02/27/2017  Hearing? [name]  Vision? N N  Difficulty concentrating or making decisions? N N  Walking or climbing stairs? Y Y  Dressing or bathing? N N  Doing errands, shopping? [name]  Comment - family Advice worker and eating ? - Y  Comment - famiy helps   Using the Toilet? - N  In the past six months, have you accidently leaked urine? - Y  Comment - wears depends   Do you have problems with loss of bowel control? - N  Managing your Medications? - N  Managing your Finances? - N  Housekeeping or managing your Housekeeping? - Y  Comment - family assist   Some recent data might be hidden    Fall/Depression Screening:  Fall Risk  01/30/2017  Falls in the past year? Yes  Number falls in past yr: 1  Injury with Fall? No  Risk for fall due to : History of fall(s)  Follow up Falls evaluation completed;Education provided;Falls prevention discussed   PHQ 2/9 Scores 01/30/2017  PHQ - 2 Score 2  PHQ- 9 Score 6  Exception Documentation Patient refusal    Assessment:  CSW spoke with patient by phone who reports she is now at her home; she is s/p 2nd back surgery and home receiving HH PT. She reports doing well and in need of additional supports. CSW discussed and will mail resources to her for review and has made a referral to [facility] for in home assistance with light housekeeping and personal care.  CSW will plan f/u call in 1-2 weeks for f/u regarding services.    Plan:  [facility] CM Care Plan Problem One     Most Recent Value  Care Plan Problem One  Patient with limited support   Role Documenting the Problem One  Clinical Social Worker  Care Plan for Problem One  Active  [facility] Long Term Goal      Interventions for Problem One Long Term Goal     [facility] CM Short Term Goal #1   Patient will be linked with communty resource/services to review and consider in the next 30 days.  [facility] CM Short Term Goal #1 Start Date  03/27/17  Interventions for Short Term Goal #1  CSW  coordinating referral to [facility] and will provide other resuorces to patient for review and consideration.   [facility] CM Short Term Goal #2      Interventions for Short Term Goal #2     [facility] CM Short Term Goal #3     Interventions for Short Tern Goal #3     [facility] CM Short Term Goal #4     Interventions for Short Term Goal #4  Eduard Clos, MSW, [place] Worker  [place] [phone]

## 2017-03-29 DIAGNOSIS — I5022 Chronic systolic (congestive) heart failure: Secondary | ICD-10-CM | POA: Diagnosis not present

## 2017-03-29 DIAGNOSIS — I130 Hypertensive heart and chronic kidney disease with heart failure and stage 1 through stage 4 chronic kidney disease, or unspecified chronic kidney disease: Secondary | ICD-10-CM | POA: Diagnosis not present

## 2017-03-29 DIAGNOSIS — E1122 Type 2 diabetes mellitus with diabetic chronic kidney disease: Secondary | ICD-10-CM | POA: Diagnosis not present

## 2017-03-29 DIAGNOSIS — I213 ST elevation (STEMI) myocardial infarction of unspecified site: Secondary | ICD-10-CM | POA: Diagnosis not present

## 2017-04-01 ENCOUNTER — Ambulatory Visit (INDEPENDENT_AMBULATORY_CARE_PROVIDER_SITE_OTHER): Payer: Medicare HMO | Admitting: Internal Medicine

## 2017-04-01 DIAGNOSIS — I130 Hypertensive heart and chronic kidney disease with heart failure and stage 1 through stage 4 chronic kidney disease, or unspecified chronic kidney disease: Secondary | ICD-10-CM | POA: Diagnosis not present

## 2017-04-01 DIAGNOSIS — L89312 Pressure ulcer of right buttock, stage 2: Secondary | ICD-10-CM | POA: Diagnosis not present

## 2017-04-01 DIAGNOSIS — Z5181 Encounter for therapeutic drug level monitoring: Secondary | ICD-10-CM

## 2017-04-01 DIAGNOSIS — I2692 Saddle embolus of pulmonary artery without acute cor pulmonale: Secondary | ICD-10-CM | POA: Diagnosis not present

## 2017-04-01 DIAGNOSIS — I5022 Chronic systolic (congestive) heart failure: Secondary | ICD-10-CM | POA: Diagnosis not present

## 2017-04-01 DIAGNOSIS — M069 Rheumatoid arthritis, unspecified: Secondary | ICD-10-CM | POA: Diagnosis not present

## 2017-04-01 DIAGNOSIS — L89322 Pressure ulcer of left buttock, stage 2: Secondary | ICD-10-CM | POA: Diagnosis not present

## 2017-04-01 DIAGNOSIS — E1122 Type 2 diabetes mellitus with diabetic chronic kidney disease: Secondary | ICD-10-CM | POA: Diagnosis not present

## 2017-04-01 DIAGNOSIS — N183 Chronic kidney disease, stage 3 (moderate): Secondary | ICD-10-CM | POA: Diagnosis not present

## 2017-04-01 DIAGNOSIS — M4855XD Collapsed vertebra, not elsewhere classified, thoracolumbar region, subsequent encounter for fracture with routine healing: Secondary | ICD-10-CM | POA: Diagnosis not present

## 2017-04-01 DIAGNOSIS — I213 ST elevation (STEMI) myocardial infarction of unspecified site: Secondary | ICD-10-CM | POA: Diagnosis not present

## 2017-04-01 LAB — POCT INR: INR: 3.4

## 2017-04-02 ENCOUNTER — Encounter: Payer: Self-pay | Admitting: *Deleted

## 2017-04-03 DIAGNOSIS — I5022 Chronic systolic (congestive) heart failure: Secondary | ICD-10-CM | POA: Diagnosis not present

## 2017-04-03 DIAGNOSIS — E1122 Type 2 diabetes mellitus with diabetic chronic kidney disease: Secondary | ICD-10-CM | POA: Diagnosis not present

## 2017-04-03 DIAGNOSIS — M069 Rheumatoid arthritis, unspecified: Secondary | ICD-10-CM | POA: Diagnosis not present

## 2017-04-03 DIAGNOSIS — I130 Hypertensive heart and chronic kidney disease with heart failure and stage 1 through stage 4 chronic kidney disease, or unspecified chronic kidney disease: Secondary | ICD-10-CM | POA: Diagnosis not present

## 2017-04-03 DIAGNOSIS — I213 ST elevation (STEMI) myocardial infarction of unspecified site: Secondary | ICD-10-CM | POA: Diagnosis not present

## 2017-04-03 DIAGNOSIS — L89312 Pressure ulcer of right buttock, stage 2: Secondary | ICD-10-CM | POA: Diagnosis not present

## 2017-04-03 DIAGNOSIS — N183 Chronic kidney disease, stage 3 (moderate): Secondary | ICD-10-CM | POA: Diagnosis not present

## 2017-04-03 DIAGNOSIS — M4855XD Collapsed vertebra, not elsewhere classified, thoracolumbar region, subsequent encounter for fracture with routine healing: Secondary | ICD-10-CM | POA: Diagnosis not present

## 2017-04-03 DIAGNOSIS — L89322 Pressure ulcer of left buttock, stage 2: Secondary | ICD-10-CM | POA: Diagnosis not present

## 2017-04-04 DIAGNOSIS — J984 Other disorders of lung: Secondary | ICD-10-CM | POA: Diagnosis not present

## 2017-04-04 DIAGNOSIS — J479 Bronchiectasis, uncomplicated: Secondary | ICD-10-CM | POA: Diagnosis not present

## 2017-04-08 DIAGNOSIS — L89322 Pressure ulcer of left buttock, stage 2: Secondary | ICD-10-CM | POA: Diagnosis not present

## 2017-04-08 DIAGNOSIS — N183 Chronic kidney disease, stage 3 (moderate): Secondary | ICD-10-CM | POA: Diagnosis not present

## 2017-04-08 DIAGNOSIS — I213 ST elevation (STEMI) myocardial infarction of unspecified site: Secondary | ICD-10-CM | POA: Diagnosis not present

## 2017-04-08 DIAGNOSIS — I5022 Chronic systolic (congestive) heart failure: Secondary | ICD-10-CM | POA: Diagnosis not present

## 2017-04-08 DIAGNOSIS — M069 Rheumatoid arthritis, unspecified: Secondary | ICD-10-CM | POA: Diagnosis not present

## 2017-04-08 DIAGNOSIS — E1122 Type 2 diabetes mellitus with diabetic chronic kidney disease: Secondary | ICD-10-CM | POA: Diagnosis not present

## 2017-04-08 DIAGNOSIS — M4855XD Collapsed vertebra, not elsewhere classified, thoracolumbar region, subsequent encounter for fracture with routine healing: Secondary | ICD-10-CM | POA: Diagnosis not present

## 2017-04-08 DIAGNOSIS — L89312 Pressure ulcer of right buttock, stage 2: Secondary | ICD-10-CM | POA: Diagnosis not present

## 2017-04-08 DIAGNOSIS — I130 Hypertensive heart and chronic kidney disease with heart failure and stage 1 through stage 4 chronic kidney disease, or unspecified chronic kidney disease: Secondary | ICD-10-CM | POA: Diagnosis not present

## 2017-04-09 ENCOUNTER — Other Ambulatory Visit: Payer: Self-pay | Admitting: *Deleted

## 2017-04-09 ENCOUNTER — Telehealth: Payer: Self-pay | Admitting: *Deleted

## 2017-04-09 ENCOUNTER — Ambulatory Visit: Payer: Self-pay | Admitting: *Deleted

## 2017-04-09 DIAGNOSIS — E1122 Type 2 diabetes mellitus with diabetic chronic kidney disease: Secondary | ICD-10-CM | POA: Diagnosis not present

## 2017-04-09 DIAGNOSIS — M069 Rheumatoid arthritis, unspecified: Secondary | ICD-10-CM | POA: Diagnosis not present

## 2017-04-09 DIAGNOSIS — M4855XD Collapsed vertebra, not elsewhere classified, thoracolumbar region, subsequent encounter for fracture with routine healing: Secondary | ICD-10-CM | POA: Diagnosis not present

## 2017-04-09 DIAGNOSIS — I213 ST elevation (STEMI) myocardial infarction of unspecified site: Secondary | ICD-10-CM | POA: Diagnosis not present

## 2017-04-09 DIAGNOSIS — I130 Hypertensive heart and chronic kidney disease with heart failure and stage 1 through stage 4 chronic kidney disease, or unspecified chronic kidney disease: Secondary | ICD-10-CM | POA: Diagnosis not present

## 2017-04-09 DIAGNOSIS — L89312 Pressure ulcer of right buttock, stage 2: Secondary | ICD-10-CM | POA: Diagnosis not present

## 2017-04-09 DIAGNOSIS — J984 Other disorders of lung: Secondary | ICD-10-CM | POA: Diagnosis not present

## 2017-04-09 DIAGNOSIS — L89322 Pressure ulcer of left buttock, stage 2: Secondary | ICD-10-CM | POA: Diagnosis not present

## 2017-04-09 DIAGNOSIS — J479 Bronchiectasis, uncomplicated: Secondary | ICD-10-CM | POA: Diagnosis not present

## 2017-04-09 DIAGNOSIS — J449 Chronic obstructive pulmonary disease, unspecified: Secondary | ICD-10-CM | POA: Diagnosis not present

## 2017-04-09 DIAGNOSIS — I5022 Chronic systolic (congestive) heart failure: Secondary | ICD-10-CM | POA: Diagnosis not present

## 2017-04-09 DIAGNOSIS — N183 Chronic kidney disease, stage 3 (moderate): Secondary | ICD-10-CM | POA: Diagnosis not present

## 2017-04-09 DIAGNOSIS — J9611 Chronic respiratory failure with hypoxia: Secondary | ICD-10-CM | POA: Diagnosis not present

## 2017-04-09 NOTE — Telephone Encounter (Signed)
Spoke with Judeth Cornfield nurse with [facility] and she states she did not have venipuncture equipment with her for this visit today Instructed to obtain venipuncture first thing in the morning and she states will do so

## 2017-04-10 ENCOUNTER — Emergency Department: Payer: Medicare HMO

## 2017-04-10 ENCOUNTER — Encounter: Payer: Self-pay | Admitting: Emergency Medicine

## 2017-04-10 ENCOUNTER — Emergency Department
Admission: EM | Admit: 2017-04-10 | Discharge: 2017-04-11 | Disposition: A | Discharge status: Home / Self Care | Payer: Medicare HMO | Attending: Emergency Medicine | Admitting: Emergency Medicine

## 2017-04-10 ENCOUNTER — Other Ambulatory Visit: Payer: Self-pay

## 2017-04-10 ENCOUNTER — Ambulatory Visit (INDEPENDENT_AMBULATORY_CARE_PROVIDER_SITE_OTHER): Payer: Medicare HMO | Admitting: Cardiology

## 2017-04-10 DIAGNOSIS — I130 Hypertensive heart and chronic kidney disease with heart failure and stage 1 through stage 4 chronic kidney disease, or unspecified chronic kidney disease: Secondary | ICD-10-CM | POA: Diagnosis not present

## 2017-04-10 DIAGNOSIS — M25551 Pain in right hip: Secondary | ICD-10-CM

## 2017-04-10 DIAGNOSIS — S3210XA Unspecified fracture of sacrum, initial encounter for closed fracture: Secondary | ICD-10-CM | POA: Insufficient documentation

## 2017-04-10 DIAGNOSIS — Z9861 Coronary angioplasty status: Secondary | ICD-10-CM | POA: Insufficient documentation

## 2017-04-10 DIAGNOSIS — Y9389 Activity, other specified: Secondary | ICD-10-CM | POA: Insufficient documentation

## 2017-04-10 DIAGNOSIS — I5022 Chronic systolic (congestive) heart failure: Secondary | ICD-10-CM | POA: Diagnosis not present

## 2017-04-10 DIAGNOSIS — E119 Type 2 diabetes mellitus without complications: Secondary | ICD-10-CM | POA: Insufficient documentation

## 2017-04-10 DIAGNOSIS — N183 Chronic kidney disease, stage 3 (moderate): Secondary | ICD-10-CM | POA: Insufficient documentation

## 2017-04-10 DIAGNOSIS — Z7901 Long term (current) use of anticoagulants: Secondary | ICD-10-CM | POA: Insufficient documentation

## 2017-04-10 DIAGNOSIS — Z7984 Long term (current) use of oral hypoglycemic drugs: Secondary | ICD-10-CM | POA: Insufficient documentation

## 2017-04-10 DIAGNOSIS — I252 Old myocardial infarction: Secondary | ICD-10-CM | POA: Insufficient documentation

## 2017-04-10 DIAGNOSIS — S79911A Unspecified injury of right hip, initial encounter: Secondary | ICD-10-CM | POA: Diagnosis not present

## 2017-04-10 DIAGNOSIS — W01198A Fall on same level from slipping, tripping and stumbling with subsequent striking against other object, initial encounter: Secondary | ICD-10-CM | POA: Insufficient documentation

## 2017-04-10 DIAGNOSIS — Z7982 Long term (current) use of aspirin: Secondary | ICD-10-CM | POA: Diagnosis not present

## 2017-04-10 DIAGNOSIS — Y999 Unspecified external cause status: Secondary | ICD-10-CM | POA: Insufficient documentation

## 2017-04-10 DIAGNOSIS — L89322 Pressure ulcer of left buttock, stage 2: Secondary | ICD-10-CM | POA: Diagnosis not present

## 2017-04-10 DIAGNOSIS — S3219XA Other fracture of sacrum, initial encounter for closed fracture: Secondary | ICD-10-CM | POA: Diagnosis not present

## 2017-04-10 DIAGNOSIS — Y92010 Kitchen of single-family (private) house as the place of occurrence of the external cause: Secondary | ICD-10-CM | POA: Diagnosis not present

## 2017-04-10 DIAGNOSIS — M4855XD Collapsed vertebra, not elsewhere classified, thoracolumbar region, subsequent encounter for fracture with routine healing: Secondary | ICD-10-CM | POA: Diagnosis not present

## 2017-04-10 DIAGNOSIS — M069 Rheumatoid arthritis, unspecified: Secondary | ICD-10-CM | POA: Diagnosis not present

## 2017-04-10 DIAGNOSIS — R404 Transient alteration of awareness: Secondary | ICD-10-CM | POA: Diagnosis not present

## 2017-04-10 DIAGNOSIS — Z86711 Personal history of pulmonary embolism: Secondary | ICD-10-CM | POA: Diagnosis not present

## 2017-04-10 DIAGNOSIS — Z5181 Encounter for therapeutic drug level monitoring: Secondary | ICD-10-CM

## 2017-04-10 DIAGNOSIS — E1122 Type 2 diabetes mellitus with diabetic chronic kidney disease: Secondary | ICD-10-CM | POA: Diagnosis not present

## 2017-04-10 DIAGNOSIS — I213 ST elevation (STEMI) myocardial infarction of unspecified site: Secondary | ICD-10-CM | POA: Diagnosis not present

## 2017-04-10 DIAGNOSIS — R531 Weakness: Secondary | ICD-10-CM | POA: Diagnosis not present

## 2017-04-10 DIAGNOSIS — I2692 Saddle embolus of pulmonary artery without acute cor pulmonale: Secondary | ICD-10-CM

## 2017-04-10 DIAGNOSIS — L89312 Pressure ulcer of right buttock, stage 2: Secondary | ICD-10-CM | POA: Diagnosis not present

## 2017-04-10 LAB — PROTIME-INR: INR: 2.5 — AB (ref 0.9–1.1)

## 2017-04-10 MED ORDER — TRAMADOL HCL 50 MG PO TABS
ORAL_TABLET | ORAL | Status: AC
  Administered 2017-04-10: 50 mg via ORAL
  Filled 2017-04-10: qty 1

## 2017-04-10 MED ORDER — ACETAMINOPHEN 500 MG PO TABS
1000.0000 mg | ORAL_TABLET | Freq: Once | ORAL | Status: AC
  Administered 2017-04-10: 1000 mg via ORAL

## 2017-04-10 MED ORDER — ACETAMINOPHEN 500 MG PO TABS
ORAL_TABLET | ORAL | Status: AC
  Administered 2017-04-10: 1000 mg via ORAL
  Filled 2017-04-10: qty 2

## 2017-04-10 MED ORDER — TRAMADOL HCL 50 MG PO TABS
50.0000 mg | ORAL_TABLET | Freq: Once | ORAL | Status: AC
  Administered 2017-04-10: 50 mg via ORAL

## 2017-04-10 MED ORDER — TRAMADOL HCL 50 MG PO TABS
50.0000 mg | ORAL_TABLET | Freq: Once | ORAL | Status: AC
  Administered 2017-04-10: 50 mg via ORAL
  Filled 2017-04-10: qty 1

## 2017-04-10 NOTE — ED Notes (Signed)
Patient transported to X-ray 

## 2017-04-10 NOTE — ED Notes (Signed)
Patient transported to CT 

## 2017-04-10 NOTE — ED Provider Notes (Signed)
[facility] Emergency Department Provider Note ____________________________________________   I have reviewed the triage vital signs and the triage nursing note.  HISTORY  Chief Complaint Fall   Historian Patient  HPI [name] is a 76 y.o. female reports fall using her walker going to the kitchen.  She states that she think the walker got away from her and she landed onto her butt.  She states she has not walked in her right hip is hurting her and then all the way across her back or buttock.  Denies any head injury or headache or confusion.  No numbness or weakness.  No neck pain or injury.  No chest pain or upper extremity injuries.  Pain is worse with moving.  Currently mild at rest.  She does take aspirin and Coumadin  Past Medical History:  Diagnosis Date  . Allergy   . Anemia   . Arthritis   . Blood transfusion without reported diagnosis   . CAD (coronary artery disease)    NSTEMI in setting of gallstone pancreatitis 05/2011:  LHC with 3v CAD; CABG was performed 06/14/11: RIMA-LAD, SVG-ramus, SVG-RCA  . Carotid stenosis    Dopplers 06/12/11: LICA 60-79%.  . Cataract   . CHF (congestive heart failure) (HCC)   . Chronic back pain   . Chronic bronchitis   . Chronic kidney disease   . Chronic systolic heart failure (HCC)   . Collagen vascular disease (HCC)   . Dextrocardia   . Diabetes mellitus without complication (HCC)   . DVT of leg (deep venous thrombosis) (HCC) ~2006   left  . Fracture 05/24/2011   right; "did not have surgery"  . GERD (gastroesophageal reflux disease)   . H/O hiatal hernia   . HLD (hyperlipidemia)   . Hypertension   . Ischemic cardiomyopathy    Echocardiogram 06/05/11: EF 40-45%, anteroseptal hypokinesis, apical hypokinesis, mild LAE  . Myocardial infarction (HCC)   . Neuromuscular disorder (HCC)   . Neuropathy   . Osteoporosis   . Oxygen deficiency    2l  . Pneumonia 06/13/11   "couple times; long time ago"  .  Pulmonary embolus [facility]) March 2013  . Rheumatoid arthritis (HCC)   . Situs inversus with dextrocardia    CT 06/2011: situs inversus totalis.  What would typically be called RCA arose from anterior sinus of Valsalva.  What would typically be called the left main arises from the posterior sinus of Valsalva and gives rise to a large first diagonal branch and diminutive circumflex    Patient Active Problem List   Diagnosis Date Noted  . Malnutrition of moderate degree 03/06/2017  . Near syncope 03/04/2017  . Chronic bronchiectasis not affecting current episode of care (HCC) 02/08/2017  . SVT (supraventricular tachycardia) (HCC) 02/08/2017  . 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 Intractable pain 02/08/2017  . Closed compression fracture of L1 lumbar vertebra (HCC)   . Rheumatoid arthritis (HCC) 02/06/2017  . Decubital ulcer 02/06/2017  . CKD (chronic kidney disease) stage 3, GFR 30-59 ml/min (HCC) 02/06/2017  . CAP (community acquired pneumonia) 05/25/2015  . Encounter for therapeutic drug monitoring 08/05/2013  . 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 CAD (coronary artery disease) 09/11/2011  . 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 Acute respiratory failure with hypoxia (HCC) 08/12/2011  . Saddle embolism of pulmonary artery (HCC) 08/12/2011  . Pericardial effusion 08/12/2011  . Atrial mass 08/12/2011  . Dyspnea 08/07/2011  . CAD 07/10/2011  . Cough 07/10/2011  .  DM2 (diabetes mellitus, type 2) (HCC) 07/10/2011  . HLD (hyperlipidemia) 07/10/2011  . Carotid stenosis   . Hypokalemia 06/09/2011  . Hypomagnesemia 06/09/2011  . Dextrocardia 06/09/2011  . Acute myocardial infarction, subendocardial infarction, subsequent episode of care (HCC) 06/08/2011  . Chronic systolic heart failure (HCC) 06/08/2011  . Acute renal failure (HCC) 06/08/2011    Past Surgical History:  Procedure Laterality Date  . BACK SURGERY    . CARDIAC CATHETERIZATION  06/11/11  . CHOLECYSTECTOMY     . VAGINAL HYSTERECTOMY  1970's    Prior to Admission medications   Medication Sig Start Date End Date Taking? Authorizing Provider  alendronate (FOSAMAX) 70 MG tablet Take 70 mg by mouth every Sunday.    Yes [provider]  allopurinol (ZYLOPRIM) 100 MG tablet Take 100 mg by mouth 2 (two) times daily.    Yes [provider]  aspirin EC 81 MG tablet Take 81 mg by mouth every morning.    Yes [provider]  cholecalciferol (VITAMIN D) 1000 UNITS tablet Take 1,000 Units by mouth every morning.    Yes [provider]  folic acid (FOLVITE) 800 MCG tablet Take 800 mcg by mouth every morning.    Yes [provider]  furosemide (LASIX) 40 MG tablet Take 1 tablet (40 mg total) by mouth daily as needed for fluid or edema. Take only if you gain 2-3 lb of fluid in a 24 hr period Patient taking differently: Take 40 mg by mouth every morning.  02/08/17 05/09/17 Yes Calvert Cantor, MD  gabapentin (NEURONTIN) 300 MG capsule Take 300 mg by mouth 2 (two) times daily.     Yes [provider]  methotrexate (RHEUMATREX) 2.5 MG tablet Take 20 mg by mouth every Monday.    Yes [provider]  metoprolol tartrate (LOPRESSOR) 25 MG tablet Take 25 mg by mouth 2 (two) times daily.   Yes [provider]  omeprazole (PRILOSEC) 20 MG capsule Take 2 capsules (40 mg total) by mouth every morning. 03/07/17  Yes Calvert Cantor, MD  simvastatin (ZOCOR) 20 MG tablet Take 20 mg by mouth every evening.    Yes [provider]  warfarin (COUMADIN) 2 MG tablet Take 6 mg for 3 days. RN should come to your home to check the INR on Tuesday. Patient taking differently: Take 2 mg daily at 6 PM by mouth.  02/09/17  Yes Calvert Cantor, MD  acetaminophen (TYLENOL) 160 MG/5ML elixir Take 320 mg by mouth every 6 (six) hours as needed for pain.     [provider]  calcium carbonate (TUMS) 500 MG chewable tablet Chew 1 tablet (200 mg of elemental calcium total) by  mouth daily. 02/09/17 02/09/18  Calvert Cantor, MD  collagenase (SANTYL) ointment Apply topically daily. Patient not taking: Reported on 04/10/2017 02/08/17   Calvert Cantor, MD  feeding supplement, ENSURE ENLIVE, (ENSURE ENLIVE) LIQD Take 237 mLs by mouth 3 (three) times daily between meals. 03/07/17   Calvert Cantor, MD  guaiFENesin (MUCINEX) 600 MG 12 hr tablet Take 1 tablet (600 mg total) by mouth 2 (two) times daily. Patient not taking: Reported on 04/10/2017 02/09/17 02/09/18  Calvert Cantor, MD  guaiFENesin (ROBITUSSIN) 100 MG/5ML SOLN Take 10 mLs (200 mg total) by mouth 3 (three) times daily. Patient not taking: Reported on 04/10/2017 02/09/17   Calvert Cantor, MD  HYDROmorphone (DILAUDID) 2 MG tablet Take 0.5 tablets (1 mg total) by mouth every 4 (four) hours as needed for severe pain. Patient not taking:  Reported on 03/04/2017 02/08/17   Calvert Cantor, MD  ipratropium-albuterol (DUONEB) 0.5-2.5 (3) MG/3ML SOLN Take 3 mLs by nebulization every 6 (six) hours as needed. Patient taking differently: Take 3 mLs by nebulization every 6 (six) hours as needed (for shortness of breath or wheezing).  11/14/16   Merwyn Katos, MD  metFORMIN (GLUCOPHAGE-XR) 500 MG 24 hr tablet Take 500 mg by mouth 2 (two) times a week.  10/31/16   [provider]  polyethylene glycol (MIRALAX / GLYCOLAX) packet Take 17 g by mouth daily as needed for mild constipation. Patient not taking: Reported on 03/04/2017 02/08/17   Calvert Cantor, MD  Respiratory Therapy Supplies (FLUTTER) DEVI Use 10-15 times daily 09/10/16   Merwyn Katos, MD  sodium chloride HYPERTONIC 3 % nebulizer solution Take 4 mLs by nebulization 2 (two) times daily. Patient not taking: Reported on 04/10/2017 02/09/17   Calvert Cantor, MD    Allergies  Allergen Reactions  . Penicillins Rash    Has patient had a PCN reaction causing immediate rash, facial/tongue/throat swelling, SOB or lightheadedness with hypotension: Yes Has patient had a PCN reaction causing severe  rash involving mucus membranes or skin necrosis: No Has patient had a PCN reaction that required hospitalization: No Has patient had a PCN reaction occurring within the last 10 years: No If all of the above answers are "NO", then may proceed with Cephalosporin use.   . Shellfish Allergy Anaphylaxis and Rash  . Hydrocodone Other (See Comments)    Pt stated "it's too high-powered, so it sends me to outer space"  . Oxycodone Other (See Comments)    Pt stated "it's too high-powered, so it sends me to outer space"  . Levaquin [Levofloxacin] Hives    Family History  Problem Relation Age of Onset  . Heart disease Father   . Heart disease Sister     Social History Social History   Tobacco Use  . Smoking status: Never Smoker  . Smokeless tobacco: Never Used  Substance Use Topics  . Alcohol use: No  . Drug use: No    Review of Systems  Constitutional: Negative for fever. Eyes: Negative for visual changes. ENT: Negative for sore throat. Cardiovascular: Negative for chest pain. Respiratory: Negative for shortness of breath. Gastrointestinal: Negative for abdominal pain, vomiting and diarrhea. Genitourinary: Negative for dysuria. Musculoskeletal: Buttock and right hip pain as per HPI. Skin: Negative for rash. Neurological: Negative for headache.  ____________________________________________   PHYSICAL EXAM:  VITAL SIGNS: ED Triage Vitals  Enc Vitals Group     BP      Pulse      Resp      Temp      Temp src      SpO2      Weight      Height      Head Circumference      Peak Flow      Pain Score      Pain Loc      Pain Edu?      Excl. in GC?      Constitutional: Alert and oriented. Well appearing and in no distress. HEENT   Head: Normocephalic and atraumatic.      Eyes: Conjunctivae are normal. Pupils equal and round.       Ears:         Nose: No congestion/rhinnorhea.   Mouth/Throat: Mucous membranes are moist.   Neck: No stridor.  No C-spine  tenderness palpation range of motion Cardiovascular/Chest: Normal  rate, regular rhythm.  No murmurs, rubs, or gallops. Respiratory: Normal respiratory effort without tachypnea nor retractions. Breath sounds are clear and equal bilaterally. No wheezes/rales/rhonchi. Gastrointestinal: Soft. No distention, no guarding, no rebound. Nontender.    Genitourinary/rectal:Deferred Musculoskeletal: Pelvis is stable.  Right hip tenderness with rotation and passive movement.  No other extremity pain or tenderness.  No obvious deformity.  Neurovascularly intact in 4 extremities. Neurologic:  Normal speech and language. No gross or focal neurologic deficits are appreciated. Skin:  Skin is warm, dry and intact. No rash noted. Psychiatric: Mood and affect are normal. Speech and behavior are normal. Patient exhibits appropriate insight and judgment.   ____________________________________________  LABS (pertinent positives/negatives) I, Governor Rooksebecca Mckinsley Koelzer, MD the attending physician have reviewed the labs noted below.  Labs Reviewed - No data to display  ____________________________________________    EKG I, Governor Rooksebecca Damir Leung, MD, the attending physician have personally viewed and interpreted all ECGs.  None ____________________________________________  RADIOLOGY All Xrays were viewed by me.  Imaging interpreted by Radiologist, and I, Governor Rooksebecca Hutch Rhett, MD the attending physician have reviewed the radiologist interpretation noted below.  Hip with pelvis x-ray:  IMPRESSION: Diffuse osteopenia. No acute fracture or dislocation of the right hip. Mild narrowing of the right hip joint space consistent with osteoarthritis. The observed portions of the bony pelvis are intact.  CT right hip:  IMPRESSION: 1. Nondisplaced fracture of the right inferior sacrum adjacent to the SI joint. 2. 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 __________________________________________  PROCEDURES  Procedure(s)  performed: None  Critical Care performed: None  ____________________________________________  No current facility-administered medications on file prior to encounter.    Current Outpatient Medications on File Prior to Encounter  Medication Sig Dispense Refill  . alendronate (FOSAMAX) 70 MG tablet Take 70 mg by mouth every Sunday.     Marland Kitchen. allopurinol (ZYLOPRIM) 100 MG tablet Take 100 mg by mouth 2 (two) times daily.     Marland Kitchen. aspirin EC 81 MG tablet Take 81 mg by mouth every morning.     . cholecalciferol (VITAMIN D) 1000 UNITS tablet Take 1,000 Units by mouth every morning.     . folic acid (FOLVITE) 800 MCG tablet Take 800 mcg by mouth every morning.     . furosemide (LASIX) 40 MG tablet Take 1 tablet (40 mg total) by mouth daily as needed for fluid or edema. Take only if you gain 2-3 lb of fluid in a 24 hr period (Patient taking differently: Take 40 mg by mouth every morning. ) 90 tablet 3  . gabapentin (NEURONTIN) 300 MG capsule Take 300 mg by mouth 2 (two) times daily.      . methotrexate (RHEUMATREX) 2.5 MG tablet Take 20 mg by mouth every Monday.     . metoprolol tartrate (LOPRESSOR) 25 MG tablet Take 25 mg by mouth 2 (two) times daily.    Marland Kitchen. omeprazole (PRILOSEC) 20 MG capsule Take 2 capsules (40 mg total) by mouth every morning. 30 capsule 0  . simvastatin (ZOCOR) 20 MG tablet Take 20 mg by mouth every evening.     . warfarin (COUMADIN) 2 MG tablet Take 6 mg for 3 days. RN should come to your home to check the INR on Tuesday. (Patient taking differently: Take 2 mg daily at 6 PM by mouth. ) 65 tablet 3  . acetaminophen (TYLENOL) 160 MG/5ML elixir Take 320 mg by mouth every 6 (six) hours as needed for pain.     . calcium carbonate (  TUMS) 500 MG chewable tablet Chew 1 tablet (200 mg of elemental calcium total) by mouth daily. 90 tablet 3  . collagenase (SANTYL) ointment Apply topically daily. (Patient not taking: Reported on 04/10/2017) 15 g 0  . feeding supplement, ENSURE ENLIVE, (ENSURE  ENLIVE) LIQD Take 237 mLs by mouth 3 (three) times daily between meals. 90 Bottle 12  . guaiFENesin (MUCINEX) 600 MG 12 hr tablet Take 1 tablet (600 mg total) by mouth 2 (two) times daily. (Patient not taking: Reported on 04/10/2017) 60 tablet 2  . guaiFENesin (ROBITUSSIN) 100 MG/5ML SOLN Take 10 mLs (200 mg total) by mouth 3 (three) times daily. (Patient not taking: Reported on 04/10/2017) 1200 mL 0  . HYDROmorphone (DILAUDID) 2 MG tablet Take 0.5 tablets (1 mg total) by mouth every 4 (four) hours as needed for severe pain. (Patient not taking: Reported on 03/04/2017) 30 tablet 0  . ipratropium-albuterol (DUONEB) 0.5-2.5 (3) MG/3ML SOLN Take 3 mLs by nebulization every 6 (six) hours as needed. (Patient taking differently: Take 3 mLs by nebulization every 6 (six) hours as needed (for shortness of breath or wheezing). ) 360 mL 5  . metFORMIN (GLUCOPHAGE-XR) 500 MG 24 hr tablet Take 500 mg by mouth 2 (two) times a week.     . polyethylene glycol (MIRALAX / GLYCOLAX) packet Take 17 g by mouth daily as needed for mild constipation. (Patient not taking: Reported on 03/04/2017) 14 each 0  . Respiratory Therapy Supplies (FLUTTER) DEVI Use 10-15 times daily 1 each 0  . sodium chloride HYPERTONIC 3 % nebulizer solution Take 4 mLs by nebulization 2 (two) times daily. (Patient not taking: Reported on 04/10/2017) 750 mL 12    ____________________________________________  ED COURSE / ASSESSMENT AND PLAN  Pertinent labs & imaging results that were available during my care of the patient were reviewed by me and considered in my medical decision making (see chart for details).  Patient denies any medical or proceeding symptoms, that she had a mechanical fall today and landed just onto her buttock.  There is no head injury or neck injury.  She is only complaining of right hip and right buttock pain.  Reevaluation around 8 PM, after second dose of tramadol, patient still extremely uncomfortable.  She is not to be able  to go home without adequate pain control.  Her daughter-in-law did come in and state that the patient was really barely functional at home by herself anyway let alone this uncontrolled pain due to acute fracture.  Patient and family would like her to be evaluated by PT and social work for placement in the morning.  DIFFERENTIAL DIAGNOSIS: Including but not limited to contusion, muscular strain, ligamentous injury, tailbone fracture, hip fracture, pelvis fracture, etc.  CONSULTATIONS:   Make consults for social work and PT for the a.m.   Patient / Family / Caregiver informed of clinical course, medical decision-making process, and agree with plan.   ___________________________________________   FINAL CLINICAL IMPRESSION(S) / ED DIAGNOSES   Final diagnoses:  Right hip pain  Closed fracture of sacrum, unspecified fracture morphology, initial encounter [facility])              Note: This dictation was prepared with Dragon dictation. Any transcriptional errors that result from this process are unintentional    Governor Rooks, MD 04/10/17 2013

## 2017-04-10 NOTE — ED Notes (Signed)
This RN and ED tech, Lenord Fellers attempted to ambulate patient, pt again with severe pain with any movement.  Able to get patient into seated position on side of bed but with severe discomfort.  Did not try to stand patient due to pain.  EDP aware.

## 2017-04-10 NOTE — ED Triage Notes (Signed)
Pt in via [facility] from home; pt with mechanical fall on Monday in kitchen, pt with worsening right hip pain since the fall.  Home PT unable to assist pt out of bed this morning.  Pt reports pain worse with any movement.  Pt A/Ox4, on 2L nasal cannula at baseline, NAD noted at this time.

## 2017-04-10 NOTE — ED Notes (Addendum)
Attempted to ambulate pt per verbal EDP order; pt w/ severe pain with minimal movement.  Pt able to get to seated position in bed w/ my assistance but did not attempt to stand pt due to pain; EDP aware.

## 2017-04-10 NOTE — ED Notes (Signed)
Pt offered sandwich tray but states that she is unable to eat it because she does not have her teeth. Pt given applesauce.

## 2017-04-11 DIAGNOSIS — J9611 Chronic respiratory failure with hypoxia: Secondary | ICD-10-CM | POA: Diagnosis not present

## 2017-04-11 DIAGNOSIS — I471 Supraventricular tachycardia: Secondary | ICD-10-CM | POA: Diagnosis not present

## 2017-04-11 DIAGNOSIS — I252 Old myocardial infarction: Secondary | ICD-10-CM | POA: Diagnosis not present

## 2017-04-11 DIAGNOSIS — E440 Moderate protein-calorie malnutrition: Secondary | ICD-10-CM | POA: Diagnosis not present

## 2017-04-11 DIAGNOSIS — M8000XD Age-related osteoporosis with current pathological fracture, unspecified site, subsequent encounter for fracture with routine healing: Secondary | ICD-10-CM | POA: Diagnosis not present

## 2017-04-11 DIAGNOSIS — I2782 Chronic pulmonary embolism: Secondary | ICD-10-CM | POA: Diagnosis not present

## 2017-04-11 DIAGNOSIS — Z7901 Long term (current) use of anticoagulants: Secondary | ICD-10-CM | POA: Diagnosis not present

## 2017-04-11 DIAGNOSIS — Z9861 Coronary angioplasty status: Secondary | ICD-10-CM | POA: Diagnosis not present

## 2017-04-11 DIAGNOSIS — I130 Hypertensive heart and chronic kidney disease with heart failure and stage 1 through stage 4 chronic kidney disease, or unspecified chronic kidney disease: Secondary | ICD-10-CM | POA: Diagnosis not present

## 2017-04-11 DIAGNOSIS — E1122 Type 2 diabetes mellitus with diabetic chronic kidney disease: Secondary | ICD-10-CM | POA: Diagnosis not present

## 2017-04-11 DIAGNOSIS — N183 Chronic kidney disease, stage 3 (moderate): Secondary | ICD-10-CM | POA: Diagnosis not present

## 2017-04-11 DIAGNOSIS — Q348 Other specified congenital malformations of respiratory system: Secondary | ICD-10-CM | POA: Diagnosis not present

## 2017-04-11 DIAGNOSIS — R0602 Shortness of breath: Secondary | ICD-10-CM | POA: Diagnosis not present

## 2017-04-11 DIAGNOSIS — M0579 Rheumatoid arthritis with rheumatoid factor of multiple sites without organ or systems involvement: Secondary | ICD-10-CM | POA: Diagnosis not present

## 2017-04-11 DIAGNOSIS — S3219XD Other fracture of sacrum, subsequent encounter for fracture with routine healing: Secondary | ICD-10-CM | POA: Diagnosis not present

## 2017-04-11 DIAGNOSIS — S3210XA Unspecified fracture of sacrum, initial encounter for closed fracture: Secondary | ICD-10-CM | POA: Diagnosis not present

## 2017-04-11 DIAGNOSIS — Z7984 Long term (current) use of oral hypoglycemic drugs: Secondary | ICD-10-CM | POA: Diagnosis not present

## 2017-04-11 DIAGNOSIS — W19XXXD Unspecified fall, subsequent encounter: Secondary | ICD-10-CM | POA: Diagnosis not present

## 2017-04-11 DIAGNOSIS — I5022 Chronic systolic (congestive) heart failure: Secondary | ICD-10-CM | POA: Diagnosis not present

## 2017-04-11 DIAGNOSIS — M11251 Other chondrocalcinosis, right hip: Secondary | ICD-10-CM | POA: Diagnosis not present

## 2017-04-11 DIAGNOSIS — S22088D Other fracture of T11-T12 vertebra, subsequent encounter for fracture with routine healing: Secondary | ICD-10-CM | POA: Diagnosis not present

## 2017-04-11 DIAGNOSIS — J410 Simple chronic bronchitis: Secondary | ICD-10-CM | POA: Diagnosis not present

## 2017-04-11 DIAGNOSIS — S329XXA Fracture of unspecified parts of lumbosacral spine and pelvis, initial encounter for closed fracture: Secondary | ICD-10-CM | POA: Diagnosis not present

## 2017-04-11 DIAGNOSIS — Z7401 Bed confinement status: Secondary | ICD-10-CM | POA: Diagnosis not present

## 2017-04-11 DIAGNOSIS — E119 Type 2 diabetes mellitus without complications: Secondary | ICD-10-CM | POA: Diagnosis not present

## 2017-04-11 DIAGNOSIS — M25551 Pain in right hip: Secondary | ICD-10-CM | POA: Diagnosis not present

## 2017-04-11 MED ORDER — SIMVASTATIN 10 MG PO TABS: 20.0000 mg | ORAL_TABLET | Freq: Every day | ORAL | Status: DC

## 2017-04-11 MED ORDER — FUROSEMIDE 40 MG PO TABS
40.0000 mg | ORAL_TABLET | Freq: Every day | ORAL | Status: DC
  Administered 2017-04-11: 40 mg via ORAL
  Filled 2017-04-11: qty 1

## 2017-04-11 MED ORDER — TRAMADOL HCL 50 MG PO TABS: 50.0000 mg | ORAL_TABLET | Freq: Four times a day (QID) | ORAL | 0 refills | Status: AC | PRN

## 2017-04-11 MED ORDER — WARFARIN SODIUM 2 MG PO TABS
2.0000 mg | ORAL_TABLET | ORAL | Status: DC
  Filled 2017-04-11: qty 1

## 2017-04-11 MED ORDER — WARFARIN - PHARMACIST DOSING INPATIENT
Freq: Every day | Status: DC
  Filled 2017-04-11 (×7): qty 1

## 2017-04-11 MED ORDER — METOPROLOL TARTRATE 25 MG PO TABS
25.0000 mg | ORAL_TABLET | Freq: Two times a day (BID) | ORAL | Status: DC
  Filled 2017-04-11: qty 1

## 2017-04-11 MED ORDER — WARFARIN SODIUM 2 MG PO TABS: 2.0000 mg | ORAL_TABLET | Freq: Every day | ORAL | Status: DC

## 2017-04-11 MED ORDER — WARFARIN SODIUM 4 MG PO TABS: 4.0000 mg | ORAL_TABLET | ORAL | Status: DC

## 2017-04-11 MED ORDER — TRAMADOL HCL 50 MG PO TABS
50.0000 mg | ORAL_TABLET | Freq: Four times a day (QID) | ORAL | Status: DC | PRN
  Administered 2017-04-11 (×2): 50 mg via ORAL
  Filled 2017-04-11 (×2): qty 1

## 2017-04-11 NOTE — ED Notes (Signed)
pts bed changed and pt changed, purewick placed, pt resting in bed in no distress

## 2017-04-11 NOTE — Evaluation (Signed)
Physical Therapy Evaluation Patient Details Name: [name] MRN: [id number] DOB: [birth year] Today's Date: 04/11/2017   History of Present Illness  76 y.o. female with history of SVT, CAD status post CABG, PE, situs inversus totalis, CKD, primary ciliary dyskinesia, gout, multilevel kyphoplasties.  She had a fall at home using her walker and suffered a non-displaced sacral fx.   Clinical Impression  Pt in considerable pain at rest in supine, severe pain with any attempt at moving (position, or even LE strength testing).  Pt could not tolerate even turning to the side in bed with assist and hand rail use and ultimately is severely functionally limited secondary to pain.  Pt completely unsafe and unable to safely go home and will require short term rehab to get back to PLOF.    Follow Up Recommendations SNF    Equipment Recommendations       Recommendations for Other Services       Precautions / Restrictions Precautions Precautions: Fall Restrictions Weight Bearing Restrictions: No      Mobility  Bed Mobility Overal bed mobility: Needs Assistance Bed Mobility: Rolling Rolling: Max assist         General bed mobility comments: Pt showed good effort in getting to sidelying using rails, etc but could not tolerate even getting fully to her side secondary to R hip pain.   Transfers                 General transfer comment: deferred secondary to severe pain with minimal in-bed mobility  Ambulation/Gait Ambulation/Gait assistance: Independent              Stairs            Wheelchair Mobility    Modified Rankin (Stroke Patients Only)       Balance                                             Pertinent Vitals/Pain Pain Assessment: 0-10 Pain Score: 9  Pain Location: R hip    Home Living Family/patient expects to be discharged to:: Skilled nursing facility                      Prior Function Level of Independence:  Independent with assistive device(s)         Comments: Uses RW. Does her own ADL (but has been sponge bathing).       Hand Dominance        Extremity/Trunk Assessment   Upper Extremity Assessment Upper Extremity Assessment: Generalized weakness(difficult to assess secondary to pain)    Lower Extremity Assessment Lower Extremity Assessment: Generalized weakness(difficult to assess secondary to pain)       Communication   Communication: No difficulties  Cognition Arousal/Alertness: Awake/alert Behavior During Therapy: WFL for tasks assessed/performed Overall Cognitive Status: Within Functional Limits for tasks assessed                                        General Comments      Exercises     Assessment/Plan    PT Assessment Patient needs continued PT services  PT Problem List Decreased strength;Decreased activity tolerance;Decreased range of motion;Decreased balance;Decreased mobility;Decreased coordination;Decreased knowledge of use of DME;Decreased safety awareness;Pain  PT Treatment Interventions DME instruction;Gait training;Stair training;Functional mobility training;Therapeutic activities;Therapeutic exercise;Balance training;Patient/family education    PT Goals (Current goals can be found in the Care Plan section)  Acute Rehab PT Goals Patient Stated Goal: control the pain PT Goal Formulation: With patient Time For Goal Achievement: 04/25/17 Potential to Achieve Goals: Fair    Frequency Min 2X/week   Barriers to discharge        Co-evaluation               AM-PAC PT "6 Clicks" Daily Activity  Outcome Measure Difficulty turning over in bed (including adjusting bedclothes, sheets and blankets)?: Unable Difficulty moving from lying on back to sitting on the side of the bed? : Unable Difficulty sitting down on and standing up from a chair with arms (e.g., wheelchair, bedside commode, etc,.)?: Unable Help needed moving to  and from a bed to chair (including a wheelchair)?: Total Help needed walking in hospital room?: Total Help needed climbing 3-5 steps with a railing? : Total 6 Click Score: 6    End of Session   Activity Tolerance: Patient limited by pain Patient left: with bed alarm set;with call bell/phone within reach Nurse Communication: Mobility status PT Visit Diagnosis: Pain;Muscle weakness (generalized) (M62.81);Difficulty in walking, not elsewhere classified (R26.2) Pain - Right/Left: Right Pain - part of body: Hip    Time: 3474-2595 PT Time Calculation (min) (ACUTE ONLY): 15 min   Charges:   PT Evaluation $PT Eval Low Complexity: 1 Low     PT G Codes:   PT G-Codes **NOT FOR INPATIENT CLASS** Functional Assessment Tool Used: AM-PAC 6 Clicks Basic Mobility Functional Limitation: Changing and maintaining body position Changing and Maintaining Body Position Current Status (G3875): 100 percent impaired, limited or restricted Changing and Maintaining Body Position Goal Status (I4332): At least 40 percent but less than 60 percent impaired, limited or restricted    Malachi Pro, DPT 04/11/2017, 1:23 PM

## 2017-04-11 NOTE — NC FL2 (Signed)
[place] MEDICAID FL2 LEVEL OF CARE SCREENING TOOL     IDENTIFICATION  Patient Name: [name] Birthdate: [birth year] Sex: female Admission Date (Current Location): 04/10/2017  [place] and [place] Number:  Chiropodist and Address:  [facility], [address] [place], [place] [address]      Provider Number: [id number]  Attending Physician Name and Address:  No att. providers found  Relative Name and Phone Number:  Daughter-In-Law Avon Gully) Tenna Delaine [phone]    Current Level of Care: Hospital Recommended Level of Care: Skilled Nursing Facility Prior Approval Number:    Date Approved/Denied:   PASRR Number: [id number]  Discharge Plan: SNF    Current Diagnoses: Patient Active Problem List   Diagnosis Date Noted  . Malnutrition of moderate degree 03/06/2017  . Near syncope 03/04/2017  . Chronic bronchiectasis not affecting current episode of care (HCC) 02/08/2017  . SVT (supraventricular tachycardia) (HCC) 02/08/2017  . 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 Intractable pain 02/08/2017  . Closed compression fracture of L1 lumbar vertebra (HCC)   . Rheumatoid arthritis (HCC) 02/06/2017  . Decubital ulcer 02/06/2017  . CKD (chronic kidney disease) stage 3, GFR 30-59 ml/min (HCC) 02/06/2017  . CAP (community acquired pneumonia) 05/25/2015  . Encounter for therapeutic drug monitoring 08/05/2013  . 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 CAD (coronary artery disease) 09/11/2011  . 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 Acute respiratory failure with hypoxia (HCC) 08/12/2011  . Saddle embolism of pulmonary artery (HCC) 08/12/2011  . Pericardial effusion 08/12/2011  . Atrial mass 08/12/2011  . Dyspnea 08/07/2011  . CAD 07/10/2011  . Cough 07/10/2011  . DM2 (diabetes mellitus, type 2) (HCC) 07/10/2011  . HLD (hyperlipidemia) 07/10/2011  . Carotid stenosis   . Hypokalemia 06/09/2011  . Hypomagnesemia  06/09/2011  . Dextrocardia 06/09/2011  . Acute myocardial infarction, subendocardial infarction, subsequent episode of care (HCC) 06/08/2011  . Chronic systolic heart failure (HCC) 06/08/2011  . Acute renal failure (HCC) 06/08/2011    Orientation RESPIRATION BLADDER Height & Weight     Self, Time, Situation, Place  O2(2L ) Continent Weight: 145 lb (65.8 kg) Height:  5\' 4"  (162.6 cm)  BEHAVIORAL SYMPTOMS/MOOD NEUROLOGICAL BOWEL NUTRITION STATUS      Continent Diet(Regular diet)  AMBULATORY STATUS COMMUNICATION OF NEEDS Skin   Extensive Assist Verbally Normal                       Personal Care Assistance Level of Assistance  Bathing, Feeding, Dressing Bathing Assistance: Maximum assistance Feeding assistance: Limited assistance Dressing Assistance: Maximum assistance     Functional Limitations Info  Sight, Hearing, Speech Sight Info: Adequate Hearing Info: Adequate Speech Info: Adequate    SPECIAL CARE FACTORS FREQUENCY  PT (By licensed PT)     PT Frequency: 5x              Contractures Contractures Info: Not present    Additional Factors Info  Code Status, Allergies Code Status Info: Full Allergies Info: Penicillins, Shellfish Allergy, Hydrocodone, Oxycodone, Levaquin Levofloxacin           Current Medications (04/11/2017):  This is the current hospital active medication list Current Facility-Administered Medications  Medication Dose Route Frequency Provider Last Rate Last Dose  . furosemide (LASIX) tablet 40 mg  40 mg Oral Daily Minna Antis, MD   40 mg at 04/11/17 0918  . metoprolol tartrate (LOPRESSOR) tablet 25 mg  25 mg Oral BID Minna Antis, MD      .  simvastatin (ZOCOR) tablet 20 mg  20 mg Oral q1800 Minna Antis, MD      . traMADol Janean Sark) tablet 50 mg  50 mg Oral Q6H PRN Irean Hong, MD   50 mg at 04/11/17 0329  . warfarin (COUMADIN) tablet 2 mg  2 mg Oral Once per day on Sun Tue Wed Thu Sat Minna Antis, MD        And  . Melene Muller ON 04/12/2017] warfarin (COUMADIN) tablet 4 mg  4 mg Oral Once per day on Mon Fri Paduchowski, Kevin, MD      . Warfarin - Pharmacist Dosing Inpatient   Does not apply [id number] Minna Antis, MD       Current Outpatient Medications  Medication Sig Dispense Refill  . alendronate (FOSAMAX) 70 MG tablet Take 70 mg by mouth every Sunday.     Marland Kitchen allopurinol (ZYLOPRIM) 100 MG tablet Take 100 mg by mouth 2 (two) times daily.     Marland Kitchen aspirin EC 81 MG tablet Take 81 mg by mouth every morning.     . cholecalciferol (VITAMIN D) 1000 UNITS tablet Take 1,000 Units by mouth every morning.     . folic acid (FOLVITE) 800 MCG tablet Take 800 mcg by mouth every morning.     . furosemide (LASIX) 40 MG tablet Take 1 tablet (40 mg total) by mouth daily as needed for fluid or edema. Take only if you gain 2-3 lb of fluid in a 24 hr period (Patient taking differently: Take 40 mg by mouth every morning. ) 90 tablet 3  . gabapentin (NEURONTIN) 300 MG capsule Take 300 mg by mouth 2 (two) times daily.      . methotrexate (RHEUMATREX) 2.5 MG tablet Take 20 mg by mouth every Monday.     . metoprolol tartrate (LOPRESSOR) 25 MG tablet Take 25 mg by mouth 2 (two) times daily.    Marland Kitchen omeprazole (PRILOSEC) 20 MG capsule Take 2 capsules (40 mg total) by mouth every morning. 30 capsule 0  . simvastatin (ZOCOR) 20 MG tablet Take 20 mg by mouth every evening.     . warfarin (COUMADIN) 2 MG tablet Take 6 mg for 3 days. RN should come to your home to check the INR on Tuesday. (Patient taking differently: Take 2 mg daily at 6 PM by mouth. ) 65 tablet 3  . acetaminophen (TYLENOL) 160 MG/5ML elixir Take 320 mg by mouth every 6 (six) hours as needed for pain.     . calcium carbonate (TUMS) 500 MG chewable tablet Chew 1 tablet (200 mg of elemental calcium total) by mouth daily. 90 tablet 3  . collagenase (SANTYL) ointment Apply topically daily. (Patient not taking: Reported on 04/10/2017) 15 g 0  . feeding supplement, ENSURE  ENLIVE, (ENSURE ENLIVE) LIQD Take 237 mLs by mouth 3 (three) times daily between meals. 90 Bottle 12  . guaiFENesin (MUCINEX) 600 MG 12 hr tablet Take 1 tablet (600 mg total) by mouth 2 (two) times daily. (Patient not taking: Reported on 04/10/2017) 60 tablet 2  . guaiFENesin (ROBITUSSIN) 100 MG/5ML SOLN Take 10 mLs (200 mg total) by mouth 3 (three) times daily. (Patient not taking: Reported on 04/10/2017) 1200 mL 0  . HYDROmorphone (DILAUDID) 2 MG tablet Take 0.5 tablets (1 mg total) by mouth every 4 (four) hours as needed for severe pain. (Patient not taking: Reported on 03/04/2017) 30 tablet 0  . ipratropium-albuterol (DUONEB) 0.5-2.5 (3) MG/3ML SOLN Take 3 mLs by nebulization every 6 (six) hours as needed. (  Patient taking differently: Take 3 mLs by nebulization every 6 (six) hours as needed (for shortness of breath or wheezing). ) 360 mL 5  . metFORMIN (GLUCOPHAGE-XR) 500 MG 24 hr tablet Take 500 mg by mouth 2 (two) times a week.     . polyethylene glycol (MIRALAX / GLYCOLAX) packet Take 17 g by mouth daily as needed for mild constipation. (Patient not taking: Reported on 03/04/2017) 14 each 0  . Respiratory Therapy Supplies (FLUTTER) DEVI Use 10-15 times daily 1 each 0  . sodium chloride HYPERTONIC 3 % nebulizer solution Take 4 mLs by nebulization 2 (two) times daily. (Patient not taking: Reported on 04/10/2017) 750 mL 12     Discharge Medications: Please see discharge summary for a list of discharge medications.  Relevant Imaging Results:  Relevant Lab Results:   Additional Information SSN: [id number]  Dominic Pea, LCSW

## 2017-04-11 NOTE — ED Provider Notes (Signed)
-----------------------------------------   7:28 AM on 04/11/2017 -----------------------------------------   Blood pressure 103/70, pulse 84, resp. rate 16, height 5\' 4"  (1.626 m), weight 65.8 kg (145 lb), SpO2 94 %.  The patient had no acute events since last update.  Calm and cooperative at this time. Disposition is pending social work recommendations.     , MD 04/11/17 [phone]

## 2017-04-11 NOTE — Clinical Social Work Note (Addendum)
CSW received consult for "Sacral fracture, uncontrolled pain -- lives alone needs placement." CSW received a call from [facility]) Social Worker Eduard Clos [phone]), stating pt is with [facility]. CSW to provide update as time progresses.   CSW met with pt at bedside. CSW introduced self and explained role. Pt from home alone, but was in short term rehab at [facility] back in August 2018. Prior to this ED visit pt was able to perform her own ADLs at home, used a walker to ambulate, and has a nurse's aid a few hours per week. P/T recommending SNF and pt is agreeable. Pt prefers [facility], but agreed for CSW to send out to [facility] [employer] and [facility]. FL-2 completed and sent out via HUB. FL-2 awaiting EDP signature. CSW given permission to speak with Daughter/HCPOA Allayne Stack about discharge plan.  CSW spoke with Magda Paganini at [facility] [phone]), who stated she would review referral and call CSW back. CSW staffed with Social Work Asst. Director Pete Pelt, who approved a 5 day LOG.    1:06pm - CSW received a call from Kalida at [facility] who confirmed bed offer. Ms. Magda Paganini stated they have started [facility] and should hear back in the next few hours. CSW asked if they are willing to accept a 5 day LOG and Ms. Magda Paganini stated she would have to check with upper management and get back to CSW.   2:27pm - CSW received call from Amanda at [facility] confirming bed offer. Magda Paganini states [place] auth still not received, but they will accept the 5 day LOG. CSW sent all clinicals via the [place]. Pt and daughter [phone]) aware and agreeable to discharge plan. RN to call report to [phone] for room 401. ED Secretary to call EMS to arrange transport. CSW Eduard Clos at [facility]. CSW signing off as no further Social Work needs identified.   Oretha Ellis, Latanya Presser, [place] Social Worker-ED [phone]

## 2017-04-11 NOTE — Progress Notes (Signed)
ANTICOAGULATION CONSULT NOTE - Follow Up Consult  Pharmacy Consult for warfarin Indication: history of saddle embolus of pulmonary artery - followed closely by [facility]  Allergies  Allergen Reactions  . Penicillins Rash    Has patient had a PCN reaction causing immediate rash, facial/tongue/throat swelling, SOB or lightheadedness with hypotension: Yes Has patient had a PCN reaction causing severe rash involving mucus membranes or skin necrosis: No Has patient had a PCN reaction that required hospitalization: No Has patient had a PCN reaction occurring within the last 10 years: No If all of the above answers are "NO", then may proceed with Cephalosporin use.   . Shellfish Allergy Anaphylaxis and Rash  . Hydrocodone Other (See Comments)    Pt stated "it's too high-powered, so it sends me to outer space"  . Oxycodone Other (See Comments)    Pt stated "it's too high-powered, so it sends me to outer space"  . Levaquin [Levofloxacin] Hives    Patient Measurements: Height: 5\' 4"  (162.6 cm) Weight: 145 lb (65.8 kg) IBW/kg (Calculated) : 54.7 Heparin Dosing Weight:   Vital Signs: BP: 103/70 (11/08 0700) Pulse Rate: 84 (11/08 0700)  Labs: Recent Labs    04/10/17  INR 2.5*    CrCl cannot be calculated (Patient's most recent lab result is older than the maximum 21 days allowed.).   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 Does not sound like she hit her head. Extensive PMH significant for PE, DVT, anemia, blood transfusion, CKD, HLD, HTN, MI, ICM, CHF, DM, neuropathy, RA. Pharmacy consulted to manage VKA while in ED.   DATE INR DOSE 11/7 2.5 Missed 11/8 NA 2 mg   Goal of Therapy:  INR 2 to 3 (per Coumadin Clinic Goal) Monitor platelets by anticoagulation protocol: Yes   Plan:  Warfarin 2 mg po daily on Sundays, Tuesdays, Wednesdays, Thursdays, and Saturdays; Warfarin 4  mg po daily on Mondays and Fridays. Recently dose was continued as above by [facility]in [facility].   10-22-1988, Pharm.D., BCPS Clinical Pharmacist 04/11/2017,7:57 AM

## 2017-04-11 NOTE — ED Notes (Signed)
Pt resting in bed, given TV controller, denies any needs, resp even and unlabored

## 2017-04-11 NOTE — ED Notes (Signed)

## 2017-04-12 ENCOUNTER — Other Ambulatory Visit: Payer: Self-pay | Admitting: *Deleted

## 2017-04-12 DIAGNOSIS — J410 Simple chronic bronchitis: Secondary | ICD-10-CM | POA: Diagnosis not present

## 2017-04-12 DIAGNOSIS — M8000XD Age-related osteoporosis with current pathological fracture, unspecified site, subsequent encounter for fracture with routine healing: Secondary | ICD-10-CM | POA: Diagnosis not present

## 2017-04-12 DIAGNOSIS — E119 Type 2 diabetes mellitus without complications: Secondary | ICD-10-CM | POA: Diagnosis not present

## 2017-04-12 DIAGNOSIS — I2782 Chronic pulmonary embolism: Secondary | ICD-10-CM | POA: Diagnosis not present

## 2017-04-12 DIAGNOSIS — M0579 Rheumatoid arthritis with rheumatoid factor of multiple sites without organ or systems involvement: Secondary | ICD-10-CM | POA: Diagnosis not present

## 2017-04-12 DIAGNOSIS — E1122 Type 2 diabetes mellitus with diabetic chronic kidney disease: Secondary | ICD-10-CM | POA: Diagnosis not present

## 2017-04-12 DIAGNOSIS — N183 Chronic kidney disease, stage 3 (moderate): Secondary | ICD-10-CM | POA: Diagnosis not present

## 2017-04-12 DIAGNOSIS — J9611 Chronic respiratory failure with hypoxia: Secondary | ICD-10-CM | POA: Diagnosis not present

## 2017-04-12 NOTE — Progress Notes (Signed)
Spoke with Jeanella Craze [facility] nurse and instructed at present will continue same dose of coumadin 2mg  daily except 4mg  on Mondays and Fridays.but will speak with pt tomorrow as she is pt in ER at present  04/11/2017 Pt is currently admitted to [facility] at present [facility] to call and let [facility] know when she gets home so can get INR rechecked and she states will do so

## 2017-04-12 NOTE — Patient Outreach (Signed)
[facility]) Care Management  04/12/2017  Siyana Erney Berthelot [birth year] [id number]   CSW was made aware by [facility] ED CSW of patient release from [facility] ED to [facility]. CSW contacted SNF rep to advise of [facility] involvement and for update. Per SNF rep, they are awaiting PT Eval and insurance auth in hopes to get SNF stay authorized. CSW attempted to reach patient without success and will plans f/u call and visit next week at SNF.  CSW has advised [facility] RNCM of SNF admit.   Reece Levy, MSW, LCSW Clinical Social Worker  [facility] [phone]

## 2017-04-15 ENCOUNTER — Other Ambulatory Visit: Payer: Self-pay | Admitting: *Deleted

## 2017-04-15 NOTE — Patient Outreach (Signed)
[facility]) Care Management  04/15/2017  Noor Witte Teaster 03-06-1941 [id number]   Spoke with Bethanie Dicker, SW at facility.  She reports that family is looking at ALF at discharge.  RNCM will let [facility] care team know of possible ALF at discharge.   Alben Spittle. Albertha Ghee, RN, BSN, CCM  Post Acute Chartered loss adjuster [phone]) Business Cell  [phone]) Toll Free Office

## 2017-04-15 NOTE — Patient Outreach (Signed)
[facility]) Care Management  [facility] Social Work  04/15/2017  [name] 1941/04/21 [id number]  Subjective:  Patient released from hospital ED to SNF rehab at [facility].  Objective: [facility] CSW to assist patient and family with community based resources to aide in their well-being, quality of life and overall safety and needs.    Encounter Medications:  Outpatient Encounter Medications as of 04/12/2017  Medication Sig Note  . acetaminophen (TYLENOL) 160 MG/5ML elixir Take 320 mg by mouth every 6 (six) hours as needed for pain.    Marland Kitchen alendronate (FOSAMAX) 70 MG tablet Take 70 mg by mouth every Sunday.    Marland Kitchen allopurinol (ZYLOPRIM) 100 MG tablet Take 100 mg by mouth 2 (two) times daily.    Marland Kitchen aspirin EC 81 MG tablet Take 81 mg by mouth every morning.    . calcium carbonate (TUMS) 500 MG chewable tablet Chew 1 tablet (200 mg of elemental calcium total) by mouth daily.   . cholecalciferol (VITAMIN D) 1000 UNITS tablet Take 1,000 Units by mouth every morning.    . collagenase (SANTYL) ointment Apply topically daily. (Patient not taking: Reported on 04/10/2017)   . feeding supplement, ENSURE ENLIVE, (ENSURE ENLIVE) LIQD Take 237 mLs by mouth 3 (three) times daily between meals.   . folic acid (FOLVITE) 800 MCG tablet Take 800 mcg by mouth every morning.    . furosemide (LASIX) 40 MG tablet Take 1 tablet (40 mg total) by mouth daily as needed for fluid or edema. Take only if you gain 2-3 lb of fluid in a 24 hr period (Patient taking differently: Take 40 mg by mouth every morning. )   . gabapentin (NEURONTIN) 300 MG capsule Take 300 mg by mouth 2 (two) times daily.     Marland Kitchen guaiFENesin (MUCINEX) 600 MG 12 hr tablet Take 1 tablet (600 mg total) by mouth 2 (two) times daily. (Patient not taking: Reported on 04/10/2017) 03/04/2017: Not taken often  . guaiFENesin (ROBITUSSIN) 100 MG/5ML SOLN Take 10 mLs (200 mg total) by mouth 3 (three) times daily. (Patient not taking: Reported on  04/10/2017)   . HYDROmorphone (DILAUDID) 2 MG tablet Take 0.5 tablets (1 mg total) by mouth every 4 (four) hours as needed for severe pain. (Patient not taking: Reported on 03/04/2017)   . ipratropium-albuterol (DUONEB) 0.5-2.5 (3) MG/3ML SOLN Take 3 mLs by nebulization every 6 (six) hours as needed. (Patient taking differently: Take 3 mLs by nebulization every 6 (six) hours as needed (for shortness of breath or wheezing). )   . metFORMIN (GLUCOPHAGE-XR) 500 MG 24 hr tablet Take 500 mg by mouth 2 (two) times a week.  03/04/2017: No particular days (just two times a week)  . methotrexate (RHEUMATREX) 2.5 MG tablet Take 20 mg by mouth every Monday.    . metoprolol tartrate (LOPRESSOR) 25 MG tablet Take 25 mg by mouth 2 (two) times daily.   Marland Kitchen omeprazole (PRILOSEC) 20 MG capsule Take 2 capsules (40 mg total) by mouth every morning.   . polyethylene glycol (MIRALAX / GLYCOLAX) packet Take 17 g by mouth daily as needed for mild constipation. (Patient not taking: Reported on 03/04/2017)   . Respiratory Therapy Supplies (FLUTTER) DEVI Use 10-15 times daily   . simvastatin (ZOCOR) 20 MG tablet Take 20 mg by mouth every evening.    . sodium chloride HYPERTONIC 3 % nebulizer solution Take 4 mLs by nebulization 2 (two) times daily. (Patient not taking: Reported on 04/10/2017)   . traMADol (ULTRAM) 50  MG tablet Take 1 tablet (50 mg total) every 6 (six) hours as needed by mouth.   . warfarin (COUMADIN) 2 MG tablet Take 6 mg for 3 days. RN should come to your home to check the INR on Tuesday. (Patient taking differently: Take 2 mg daily at 6 PM by mouth. )    No facility-administered encounter medications on file as of 04/12/2017.     Functional Status:  In your present state of health, do you have any difficulty performing the following activities: 03/05/2017 02/27/2017  Hearing? [name]  Vision? N N  Difficulty concentrating or making decisions? N N  Walking or climbing stairs? Y Y  Dressing or bathing? N N  Doing  errands, shopping? [name]  Comment - family Advice worker and eating ? - Y  Comment - famiy helps   Using the Toilet? - N  In the past six months, have you accidently leaked urine? - Y  Comment - wears depends   Do you have problems with loss of bowel control? - N  Managing your Medications? - N  Managing your Finances? - N  Housekeeping or managing your Housekeeping? - Y  Comment - family assist   Some recent data might be hidden    Fall/Depression Screening:  PHQ 2/9 Scores 01/30/2017  PHQ - 2 Score 2  PHQ- 9 Score 6  Exception Documentation Patient refusal    Assessment: CSW spoke with SNF rehab SW who confirms patient is at [facility] for rehab. The SNF rep reports plans for PT and OT to work with patient and they will submit notes to insurance for SNF auth.  CSW advised SNF rep of plans to visit next week.  Plan: SNF visit next week.   Reece Levy, MSW, LCSW Clinical Social Worker  [facility] [phone]

## 2017-04-16 ENCOUNTER — Other Ambulatory Visit: Payer: Self-pay | Admitting: *Deleted

## 2017-04-16 NOTE — Patient Outreach (Addendum)
[facility]) Care Management  [facility] Social Work  04/16/2017  [name] 1940/12/01 [id number]  Subjective:  Patient receiving PT and OT at [facility] rehab.   Objective: [facility] CSW to assist patient and family with community based resources to aide in their well-being, quality of life and overall safety and needs.    Encounter Medications:  Outpatient Encounter Medications as of 04/16/2017  Medication Sig Note  . acetaminophen (TYLENOL) 160 MG/5ML elixir Take 320 mg by mouth every 6 (six) hours as needed for pain.    Marland Kitchen alendronate (FOSAMAX) 70 MG tablet Take 70 mg by mouth every Sunday.    Marland Kitchen allopurinol (ZYLOPRIM) 100 MG tablet Take 100 mg by mouth 2 (two) times daily.    Marland Kitchen aspirin EC 81 MG tablet Take 81 mg by mouth every morning.    . calcium carbonate (TUMS) 500 MG chewable tablet Chew 1 tablet (200 mg of elemental calcium total) by mouth daily.   . cholecalciferol (VITAMIN D) 1000 UNITS tablet Take 1,000 Units by mouth every morning.    . collagenase (SANTYL) ointment Apply topically daily. (Patient not taking: Reported on 04/10/2017)   . feeding supplement, ENSURE ENLIVE, (ENSURE ENLIVE) LIQD Take 237 mLs by mouth 3 (three) times daily between meals.   . folic acid (FOLVITE) 800 MCG tablet Take 800 mcg by mouth every morning.    . furosemide (LASIX) 40 MG tablet Take 1 tablet (40 mg total) by mouth daily as needed for fluid or edema. Take only if you gain 2-3 lb of fluid in a 24 hr period (Patient taking differently: Take 40 mg by mouth every morning. )   . gabapentin (NEURONTIN) 300 MG capsule Take 300 mg by mouth 2 (two) times daily.     Marland Kitchen guaiFENesin (MUCINEX) 600 MG 12 hr tablet Take 1 tablet (600 mg total) by mouth 2 (two) times daily. (Patient not taking: Reported on 04/10/2017) 03/04/2017: Not taken often  . guaiFENesin (ROBITUSSIN) 100 MG/5ML SOLN Take 10 mLs (200 mg total) by mouth 3 (three) times daily. (Patient not taking: Reported on 04/10/2017)    . HYDROmorphone (DILAUDID) 2 MG tablet Take 0.5 tablets (1 mg total) by mouth every 4 (four) hours as needed for severe pain. (Patient not taking: Reported on 03/04/2017)   . ipratropium-albuterol (DUONEB) 0.5-2.5 (3) MG/3ML SOLN Take 3 mLs by nebulization every 6 (six) hours as needed. (Patient taking differently: Take 3 mLs by nebulization every 6 (six) hours as needed (for shortness of breath or wheezing). )   . metFORMIN (GLUCOPHAGE-XR) 500 MG 24 hr tablet Take 500 mg by mouth 2 (two) times a week.  03/04/2017: No particular days (just two times a week)  . methotrexate (RHEUMATREX) 2.5 MG tablet Take 20 mg by mouth every Monday.    . metoprolol tartrate (LOPRESSOR) 25 MG tablet Take 25 mg by mouth 2 (two) times daily.   Marland Kitchen omeprazole (PRILOSEC) 20 MG capsule Take 2 capsules (40 mg total) by mouth every morning.   . polyethylene glycol (MIRALAX / GLYCOLAX) packet Take 17 g by mouth daily as needed for mild constipation. (Patient not taking: Reported on 03/04/2017)   . Respiratory Therapy Supplies (FLUTTER) DEVI Use 10-15 times daily   . simvastatin (ZOCOR) 20 MG tablet Take 20 mg by mouth every evening.    . sodium chloride HYPERTONIC 3 % nebulizer solution Take 4 mLs by nebulization 2 (two) times daily. (Patient not taking: Reported on 04/10/2017)   . traMADol (ULTRAM) 50  MG tablet Take 1 tablet (50 mg total) every 6 (six) hours as needed by mouth.   . warfarin (COUMADIN) 2 MG tablet Take 6 mg for 3 days. RN should come to your home to check the INR on Tuesday. (Patient taking differently: Take 2 mg daily at 6 PM by mouth. )    No facility-administered encounter medications on file as of 04/16/2017.     Functional Status:  In your present state of health, do you have any difficulty performing the following activities: 03/05/2017 02/27/2017  Hearing? [name]  Vision? N N  Difficulty concentrating or making decisions? N N  Walking or climbing stairs? Y Y  Dressing or bathing? N N  Doing errands,  shopping? [name]  Comment - family Advice worker and eating ? - Y  Comment - famiy helps   Using the Toilet? - N  In the past six months, have you accidently leaked urine? - Y  Comment - wears depends   Do you have problems with loss of bowel control? - N  Managing your Medications? - N  Managing your Finances? - N  Housekeeping or managing your Housekeeping? - Y  Comment - family assist   Some recent data might be hidden    Fall/Depression Screening:  PHQ 2/9 Scores 01/30/2017  PHQ - 2 Score 2  PHQ- 9 Score 6  Exception Documentation Patient refusal    Assessment:  CSW was unable to visit with patient today but per SNF rep, she is participating in therapies with no dc disposition determined yet. There is conversation about potential long term facility placement.  Plan:  CSW will plan SNF visit to meet with patient later this week.        Reece Levy, MSW, LCSW Clinical Social Worker  [facility] [phone]

## 2017-04-18 ENCOUNTER — Other Ambulatory Visit: Payer: Self-pay | Admitting: *Deleted

## 2017-04-18 ENCOUNTER — Ambulatory Visit: Payer: Self-pay | Admitting: *Deleted

## 2017-04-18 NOTE — Patient Outreach (Addendum)
[facility]) Care Management  04/18/2017  Meribeth Vitug Nienhuis Oct 22, 1940 [id number]   Follow up on outreach letter  Patient to [facility] ED on 11/7 for fall at home and admitted to [facility] on 11/8.   Plan Will close transition of care  program  and nursing program as patient in SNF, will discuss with Reece Levy, LCSW and follow for further needs.    Egbert Garibaldi, RN, [facility] Care Management,Care Management Coordinator  [phone]- Mobile [phone]- Toll Free Main Office

## 2017-04-24 ENCOUNTER — Other Ambulatory Visit: Payer: Self-pay | Admitting: *Deleted

## 2017-04-26 ENCOUNTER — Other Ambulatory Visit: Payer: Self-pay | Admitting: *Deleted

## 2017-04-26 ENCOUNTER — Encounter: Payer: Self-pay | Admitting: *Deleted

## 2017-04-26 NOTE — Patient Outreach (Signed)
[facility]) Care Management  04/26/2017  Taneka Espiritu Pardoe 11-01-1940 [id number]   Case Closure   Received in basket message from Reece Levy that patient will remain under Long term care at [facility]  skilled facility.  Plan Will close case per protocol, will send patient and PCP closure letters. Will notify CMA of case closure due plans for long term placement in facility setting.   Egbert Garibaldi, RN, [facility] Care Management,Care Management Coordinator  [phone]- Mobile [phone]- Toll Free Main Office

## 2017-04-26 NOTE — Patient Outreach (Signed)
[name]) Care Management  [name] Social Work  04/26/2017  [name] Jan 29, 1941 [id number]  Subjective:  "Patient is not progressing with therapies at SNF and plans on long term placement".   Objective: [facility] CSW to assist patient and family with community based resources to aide in their well-being, quality of Alyssa and overall safety and needs.    Current Medications:  Current Outpatient Medications  Medication Sig Dispense Refill  . acetaminophen (TYLENOL) 160 MG/5ML elixir Take 320 mg by mouth every 6 (six) hours as needed for pain.     Marland Kitchen alendronate (FOSAMAX) 70 MG tablet Take 70 mg by mouth every Sunday.     Marland Kitchen allopurinol (ZYLOPRIM) 100 MG tablet Take 100 mg by mouth 2 (two) times daily.     Marland Kitchen aspirin EC 81 MG tablet Take 81 mg by mouth every morning.     . calcium carbonate (TUMS) 500 MG chewable tablet Chew 1 tablet (200 mg of elemental calcium total) by mouth daily. 90 tablet 3  . cholecalciferol (VITAMIN D) 1000 UNITS tablet Take 1,000 Units by mouth every morning.     . collagenase (SANTYL) ointment Apply topically daily. (Patient not taking: Reported on 04/10/2017) 15 g 0  . feeding supplement, ENSURE ENLIVE, (ENSURE ENLIVE) LIQD Take 237 mLs by mouth 3 (three) times daily between meals. 90 Bottle 12  . folic acid (FOLVITE) 431 MCG tablet Take 800 mcg by mouth every morning.     . furosemide (LASIX) 40 MG tablet Take 1 tablet (40 mg total) by mouth daily as needed for fluid or edema. Take only if you gain 2-3 lb of fluid in a 24 hr period (Patient taking differently: Take 40 mg by mouth every morning. ) 90 tablet 3  . gabapentin (NEURONTIN) 300 MG capsule Take 300 mg by mouth 2 (two) times daily.      Marland Kitchen guaiFENesin (MUCINEX) 600 MG 12 hr tablet Take 1 tablet (600 mg total) by mouth 2 (two) times daily. (Patient not taking: Reported on 04/10/2017) 60 tablet 2  . guaiFENesin (ROBITUSSIN) 100 MG/5ML SOLN Take 10 mLs (200 mg total) by mouth 3 (three) times  daily. (Patient not taking: Reported on 04/10/2017) 1200 mL 0  . HYDROmorphone (DILAUDID) 2 MG tablet Take 0.5 tablets (1 mg total) by mouth every 4 (four) hours as needed for severe pain. (Patient not taking: Reported on 03/04/2017) 30 tablet 0  . ipratropium-albuterol (DUONEB) 0.5-2.5 (3) MG/3ML SOLN Take 3 mLs by nebulization every 6 (six) hours as needed. (Patient taking differently: Take 3 mLs by nebulization every 6 (six) hours as needed (for shortness of breath or wheezing). ) 360 mL 5  . metFORMIN (GLUCOPHAGE-XR) 500 MG 24 hr tablet Take 500 mg by mouth 2 (two) times a week.     . methotrexate (RHEUMATREX) 2.5 MG tablet Take 20 mg by mouth every Monday.     . metoprolol tartrate (LOPRESSOR) 25 MG tablet Take 25 mg by mouth 2 (two) times daily.    Marland Kitchen omeprazole (PRILOSEC) 20 MG capsule Take 2 capsules (40 mg total) by mouth every morning. 30 capsule 0  . polyethylene glycol (MIRALAX / GLYCOLAX) packet Take 17 g by mouth daily as needed for mild constipation. (Patient not taking: Reported on 03/04/2017) 14 each 0  . Respiratory Therapy Supplies (FLUTTER) DEVI Use 10-15 times daily 1 each 0  . simvastatin (ZOCOR) 20 MG tablet Take 20 mg by mouth every evening.     . sodium chloride HYPERTONIC 3 %  nebulizer solution Take 4 mLs by nebulization 2 (two) times daily. (Patient not taking: Reported on 04/10/2017) 750 mL 12  . traMADol (ULTRAM) 50 MG tablet Take 1 tablet (50 mg total) every 6 (six) hours as needed by mouth. 20 tablet 0  . warfarin (COUMADIN) 2 MG tablet Take 6 mg for 3 days. RN should come to your home to check the INR on Tuesday. (Patient taking differently: Take 2 mg daily at 6 PM by mouth. ) 65 tablet 3   No current facility-administered medications for this visit.     Functional Status:  In your present state of health, do you have any difficulty performing the following activities: 03/05/2017 02/27/2017  Hearing? [name]  Vision? N N  Difficulty concentrating or making decisions? N N   Walking or climbing stairs? Y Y  Dressing or bathing? N N  Doing errands, shopping? [name]  Comment - family Diplomatic Services operational officer and eating ? - Y  Comment - famiy helps   Using the Toilet? - N  In the past six months, have you accidently leaked urine? - Y  Comment - wears depends   Do you have problems with loss of bowel control? - N  Managing your Medications? - N  Managing your Finances? - N  Housekeeping or managing your Housekeeping? - Y  Comment - family assist   Some recent data might be hidden    Fall/Depression Screening:  Fall Risk  01/30/2017  Falls in the past year? Yes  Number falls in past yr: 1  Injury with Fall? No  Risk for fall due to : History of fall(s)  Follow up Falls evaluation completed;Education provided;Falls prevention discussed   PHQ 2/9 Scores 01/30/2017  PHQ - 2 Score 2  PHQ- 9 Score 6  Exception Documentation Patient refusal    Assessment: Patient remains at [facility] and per SNF rep, she is not progressing with PT to the point that she's not participating and thus plans underway to transition to long term NH placement. [name] anticipates a bed for her and the family is applying for Medicaid.  They had hoped she may be able to progress and move into an ALF or small family care home but her immobility and pain are limiting her ability to rehabilitate further.  CSW spoke with her daughter, [name], by phone and advised her of plans for case closure. "It's not an easy decision to make" but daughter states they know its in her best safety/health interest.  Plan:  CSW plans to close referral given plans for long term placement in facility setting.  CSW will advise PCP and [name] team of above.   [facility] CM Care Plan Problem One     Most Recent Value  Care Plan Problem One  Patient with limited support   Role Documenting the Problem One  Clinical Social Worker  Care Plan for Problem One  Active  [facility] Long Term Goal      Interventions  for Problem One Long Term Goal     [facility] CM Short Term Goal #1   Patient will be linked with communty resource/services to review and consider in the next 30 days.  [facility] CM Short Term Goal #1 Start Date  03/27/17  [name] [place] CM Short Term Goal #1 Met Date  04/26/17  Interventions for Short Term Goal #1  CSW coordinating referral to [facility] and will provide other resuorces to patient for review and consideration.   [facility]  CM Short Term Goal #2      Interventions for Short Term Goal #2     [facility] CM Short Term Goal #3     Interventions for Short Tern Goal #3     [facility] CM Short Term Goal #4     Interventions for Short Term Goal #4        Eduard Clos, MSW, [place] Worker  [place] [phone]

## 2017-05-01 DIAGNOSIS — S3210XA Unspecified fracture of sacrum, initial encounter for closed fracture: Secondary | ICD-10-CM | POA: Diagnosis not present

## 2017-05-01 DIAGNOSIS — M25551 Pain in right hip: Secondary | ICD-10-CM | POA: Diagnosis not present

## 2017-05-03 ENCOUNTER — Ambulatory Visit: Payer: Self-pay | Admitting: Internal Medicine

## 2017-05-03 DIAGNOSIS — Z5181 Encounter for therapeutic drug level monitoring: Secondary | ICD-10-CM

## 2017-05-04 DIAGNOSIS — J479 Bronchiectasis, uncomplicated: Secondary | ICD-10-CM | POA: Diagnosis not present

## 2017-05-04 DIAGNOSIS — J984 Other disorders of lung: Secondary | ICD-10-CM | POA: Diagnosis not present

## 2017-05-06 ENCOUNTER — Inpatient Hospital Stay: Admission: RE | Admit: 2017-05-06 | Payer: Medicare HMO | Source: Ambulatory Visit

## 2017-05-06 DIAGNOSIS — Z7901 Long term (current) use of anticoagulants: Secondary | ICD-10-CM | POA: Diagnosis not present

## 2017-05-06 DIAGNOSIS — N390 Urinary tract infection, site not specified: Secondary | ICD-10-CM | POA: Diagnosis not present

## 2017-05-07 ENCOUNTER — Ambulatory Visit: Admit: 2017-05-07 | Payer: Medicare HMO | Admitting: Orthopedic Surgery

## 2017-05-07 SURGERY — VERTEBROPLASTY
Anesthesia: Choice

## 2017-05-09 DIAGNOSIS — J984 Other disorders of lung: Secondary | ICD-10-CM | POA: Diagnosis not present

## 2017-05-09 DIAGNOSIS — Z7901 Long term (current) use of anticoagulants: Secondary | ICD-10-CM | POA: Diagnosis not present

## 2017-05-09 DIAGNOSIS — J479 Bronchiectasis, uncomplicated: Secondary | ICD-10-CM | POA: Diagnosis not present

## 2017-05-09 DIAGNOSIS — J9611 Chronic respiratory failure with hypoxia: Secondary | ICD-10-CM | POA: Diagnosis not present

## 2017-05-09 DIAGNOSIS — N390 Urinary tract infection, site not specified: Secondary | ICD-10-CM | POA: Diagnosis not present

## 2017-05-09 DIAGNOSIS — J449 Chronic obstructive pulmonary disease, unspecified: Secondary | ICD-10-CM | POA: Diagnosis not present

## 2017-05-10 DIAGNOSIS — I2510 Atherosclerotic heart disease of native coronary artery without angina pectoris: Secondary | ICD-10-CM | POA: Diagnosis not present

## 2017-05-10 DIAGNOSIS — I5022 Chronic systolic (congestive) heart failure: Secondary | ICD-10-CM | POA: Diagnosis not present

## 2017-05-10 DIAGNOSIS — E1122 Type 2 diabetes mellitus with diabetic chronic kidney disease: Secondary | ICD-10-CM | POA: Diagnosis not present

## 2017-05-10 DIAGNOSIS — I130 Hypertensive heart and chronic kidney disease with heart failure and stage 1 through stage 4 chronic kidney disease, or unspecified chronic kidney disease: Secondary | ICD-10-CM | POA: Diagnosis not present

## 2017-05-20 DIAGNOSIS — R05 Cough: Secondary | ICD-10-CM | POA: Diagnosis not present

## 2017-05-20 DIAGNOSIS — R110 Nausea: Secondary | ICD-10-CM | POA: Diagnosis not present

## 2017-05-20 DIAGNOSIS — M25551 Pain in right hip: Secondary | ICD-10-CM | POA: Diagnosis not present

## 2017-05-20 DIAGNOSIS — T148XXA Other injury of unspecified body region, initial encounter: Secondary | ICD-10-CM | POA: Diagnosis not present

## 2017-05-31 ENCOUNTER — Telehealth: Payer: Self-pay | Admitting: Pulmonary Disease

## 2017-05-31 NOTE — Telephone Encounter (Signed)
Per Family patient deceased 05-27-2017

## 2017-06-04 DEATH — deceased

## 2018-01-21 IMAGING — CT CT HEAD W/O CM
4 series · 15 of 47 positions shown, 17 images · non-contrast
Comparison: None.

CLINICAL DATA: Dizziness and presyncope. Status post kyphoplasties
February 18, 2017. History of hypertension, hyperlipidemia,
diabetes, neuromuscular disorder.

EXAM:
CT HEAD WITHOUT CONTRAST
TECHNIQUE: Contiguous axial images were obtained from the base of the skull
through the vertex without intravenous contrast.

[Series 3: head without · axial · non-contrast · 0.41mm/px · z∈[-219,-109]mm · 7 of 30 slices shown, 9 images]
[im 4/30  brain]
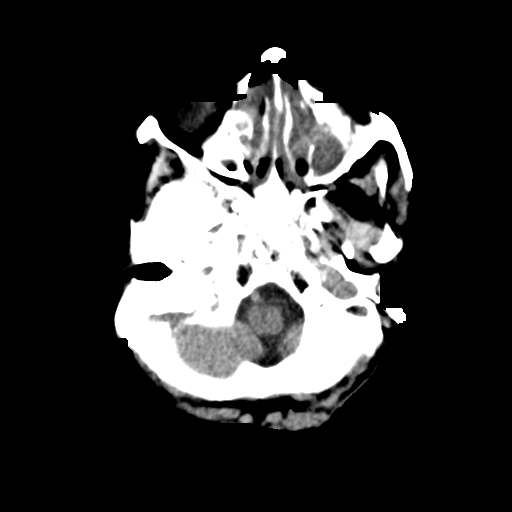
[im 4/30  bone]
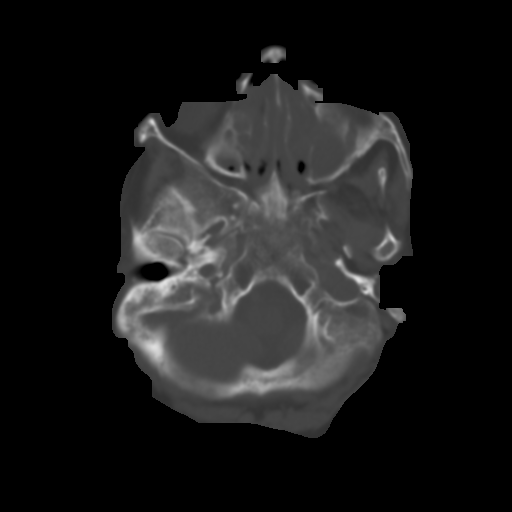
[im 8/30  brain]
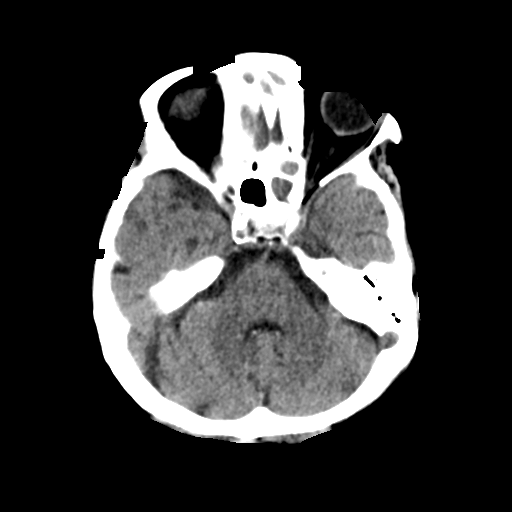
[im 11/30  brain]
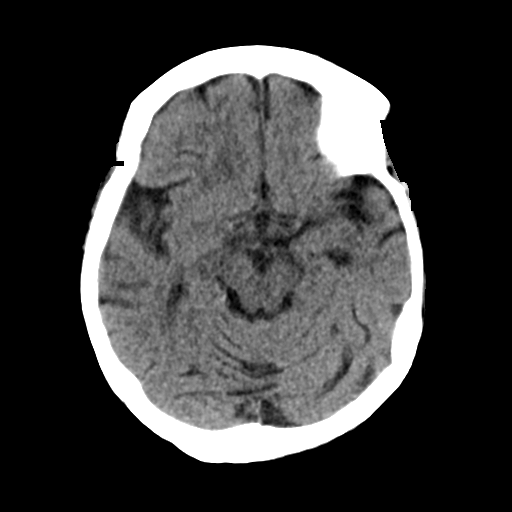
[im 15/30  brain]
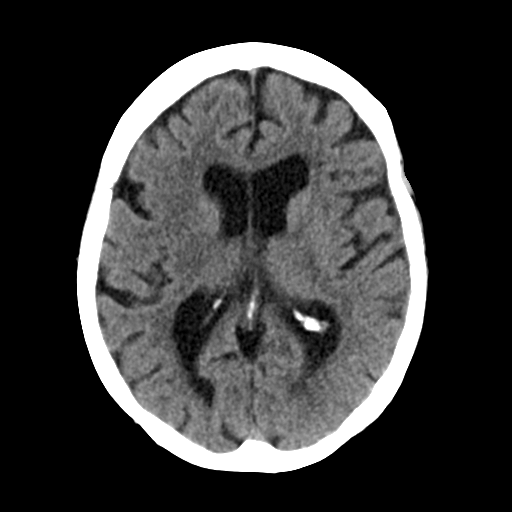
[im 19/30  brain]
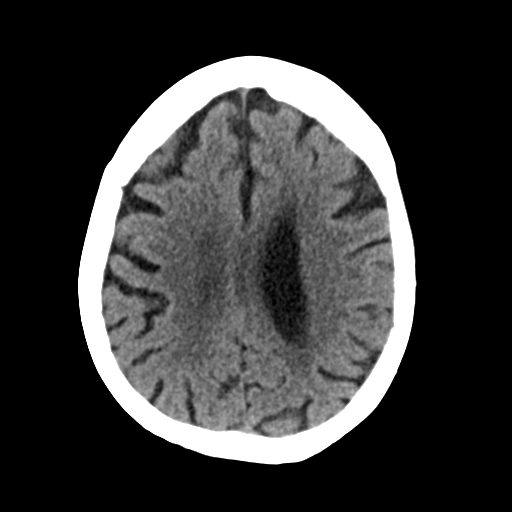
[im 19/30  bone]
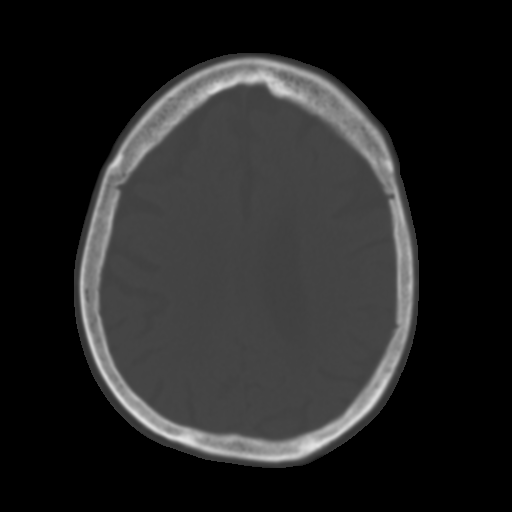
[im 22/30  brain]
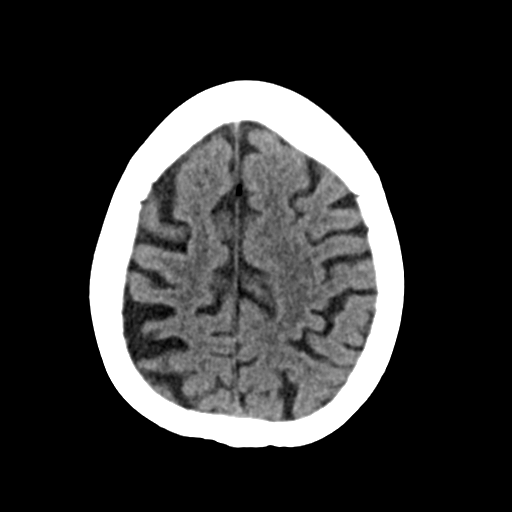
[im 26/30  brain]
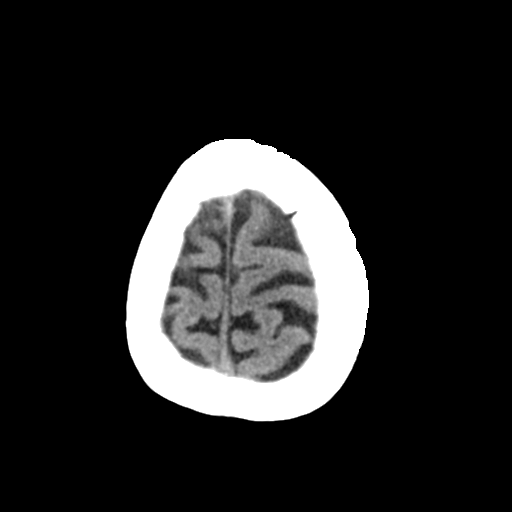

[Series 4: head bone · axial · 0.41mm/px · z∈[-220,-206]mm · 2 of 75 slices shown]
[im 8/75  bone]
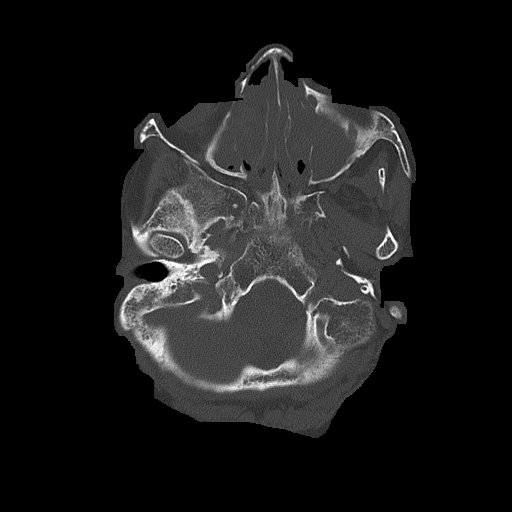
[im 15/75  bone]
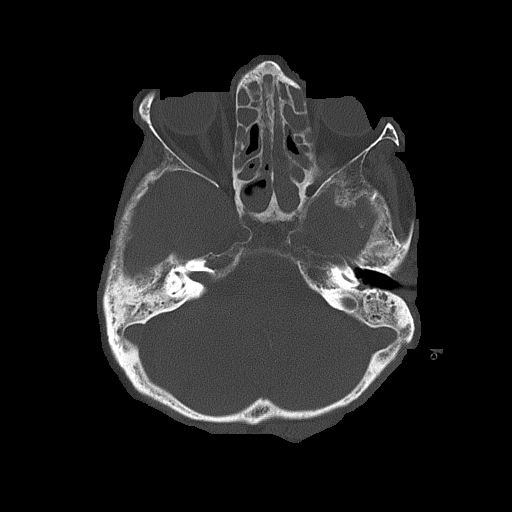

[Series 5: head without cor · coronal · non-contrast · 0.31mm/px · 3 of 67 slices shown]
[im 23/67  brain]
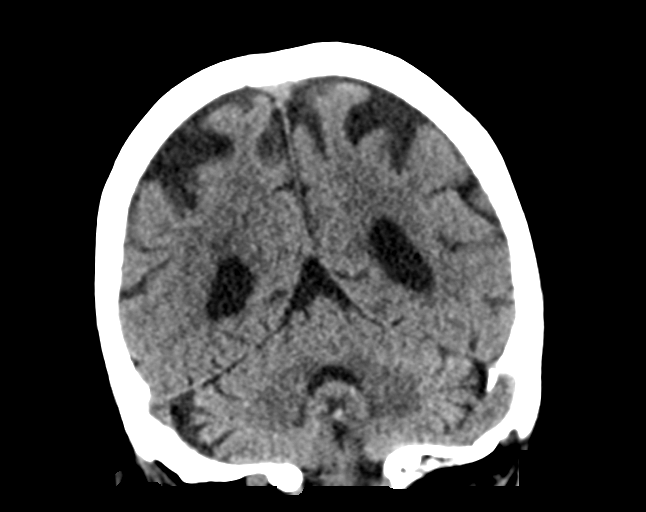
[im 30/67  brain]
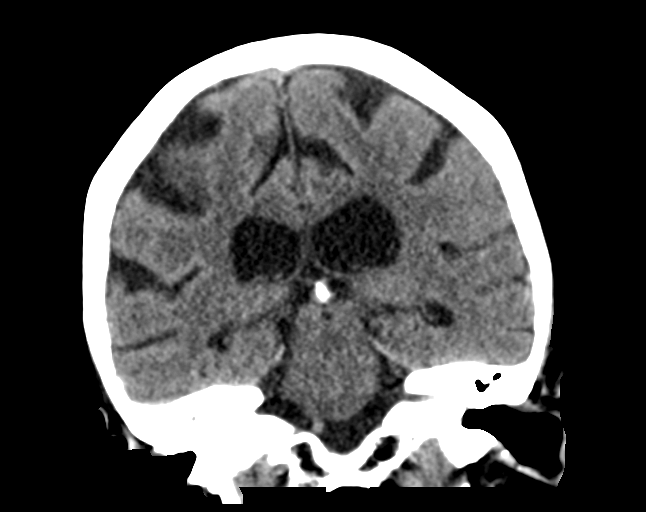
[im 37/67  brain]
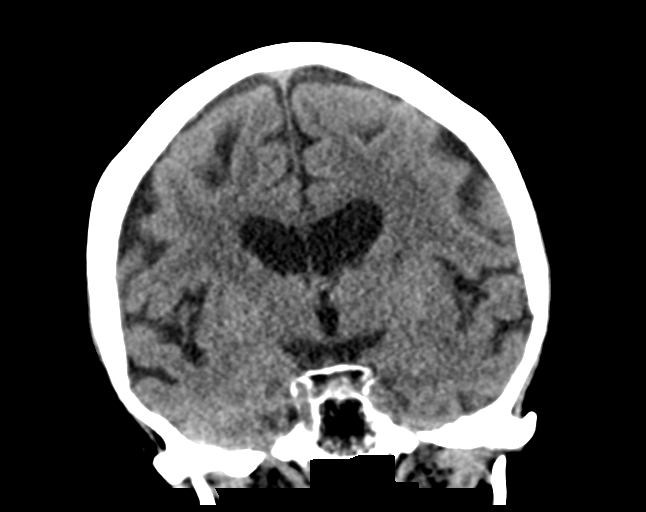

[Series 6: head without sag · sagittal · non-contrast · 0.30mm/px · 3 of 58 slices shown]
[im 20/58  brain]
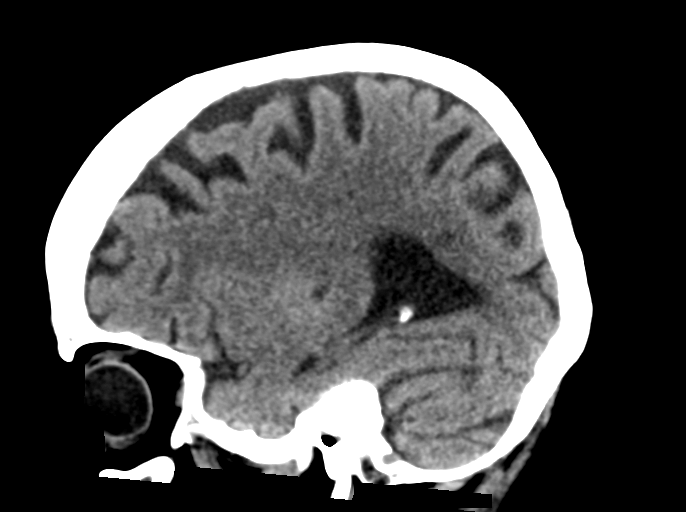
[im 29/58  brain]
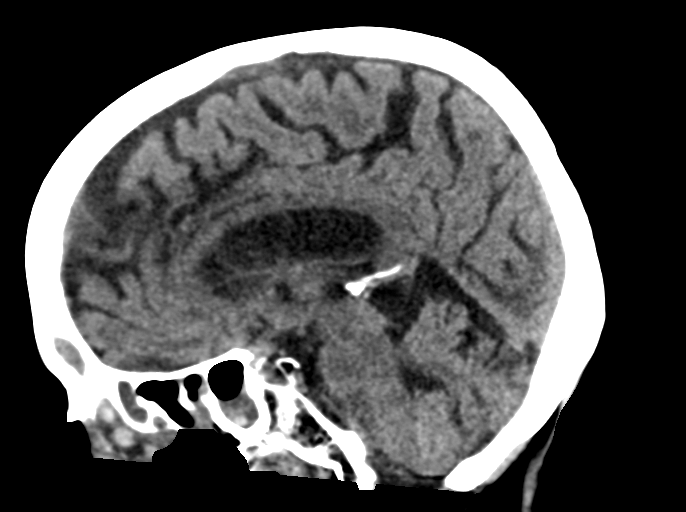
[im 39/58  brain]
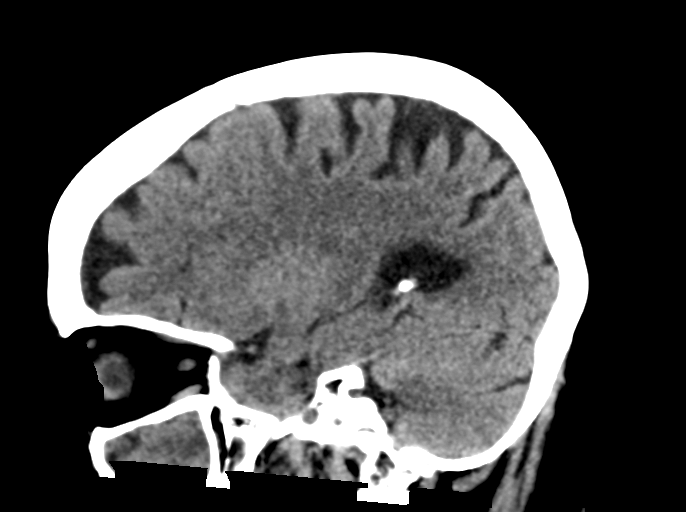

[15 of 47 positions shown; findings below may reference images not displayed]

FINDINGS: BRAIN: No intraparenchymal hemorrhage, mass effect nor midline
shift. The ventricles and sulci are normal for age. Patchy
supratentorial white matter hypodensities less than expected for
patient's age, though non-specific are most compatible with chronic
small vessel ischemic disease. Old small RIGHT parietal lobe
infarct. No acute large vascular territory infarcts. No abnormal
extra-axial fluid collections. Basal cisterns are patent.

VASCULAR: Moderate calcific atherosclerosis of the carotid siphons.

SKULL: No skull fracture. Osteopenia. No significant scalp soft
tissue swelling.

SINUSES/ORBITS: Severe pan paranasal sinusitis. Bilateral middle ear
and mastoid effusions without air cell coalescence. The included
ocular globes and orbital contents are non-suspicious. Status post
bilateral ocular lens implants.

OTHER: None.
IMPRESSION: 1. Old small RIGHT parietal lobe infarct; otherwise negative
noncontrast CT HEAD for age.
2. Severe chronic pan paranasal sinusitis. Bilateral middle ear and
mastoid effusions.

## 2018-01-21 IMAGING — CT CT ABD-PELV W/O CM
2 of 4 series · 15 of 46 positions shown, 17 images · non-contrast
Comparison: CT the abdomen and pelvis 12/30/2016.

CLINICAL DATA: 75-year-old female with history of nausea and non
bilious vomiting.

EXAM:
CT ABDOMEN AND PELVIS WITHOUT CONTRAST
TECHNIQUE: Multidetector CT imaging of the abdomen and pelvis was performed
following the standard protocol without IV contrast.

[Series 4: a/p w/o 5mm · axial · non-contrast · 0.95mm/px · z∈[+897,+1277]mm · 12 of 90 slices shown, 14 images]
[im 7/90  soft-tissue]
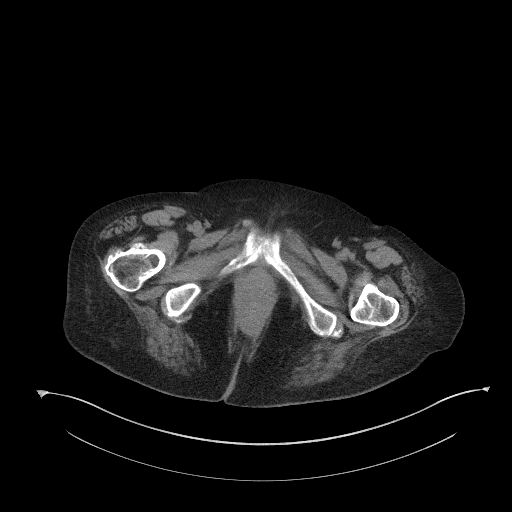
[im 7/90  bone]
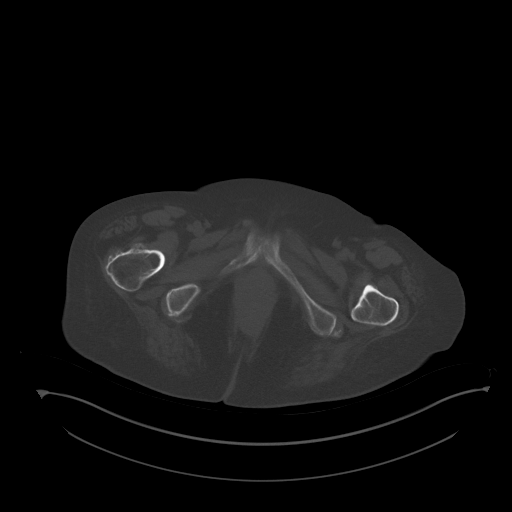
[im 14/90  soft-tissue]
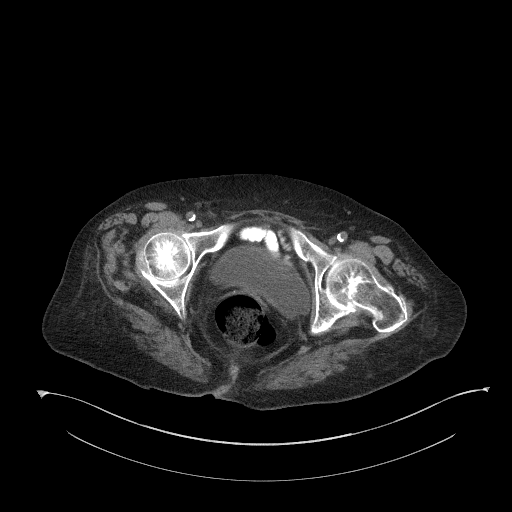
[im 21/90  soft-tissue]
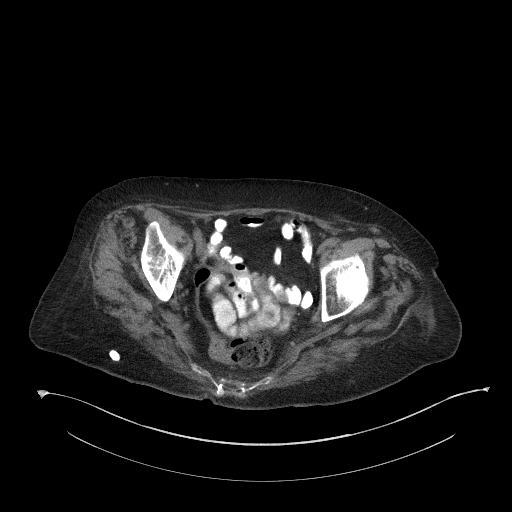
[im 28/90  soft-tissue]
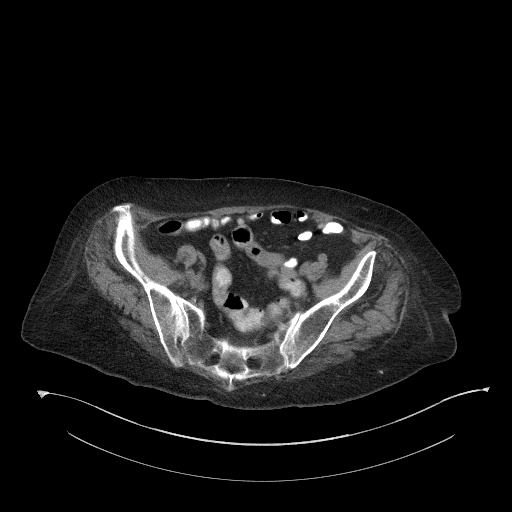
[im 35/90  soft-tissue]
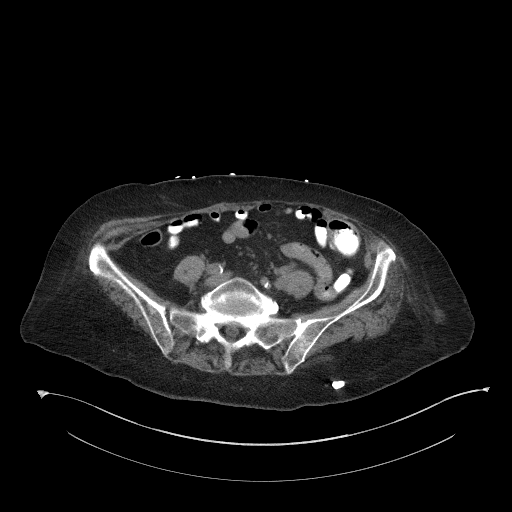
[im 42/90  soft-tissue]
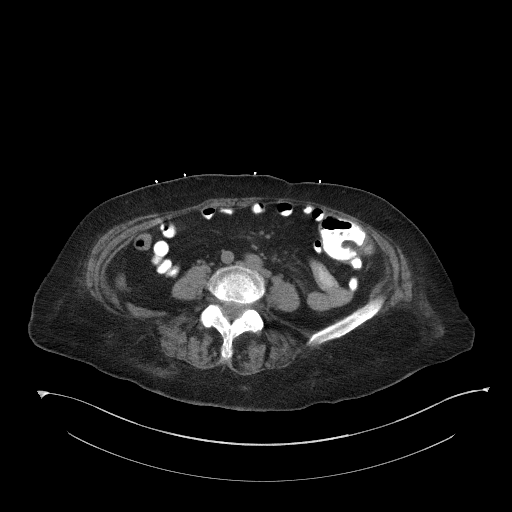
[im 48/90  soft-tissue]
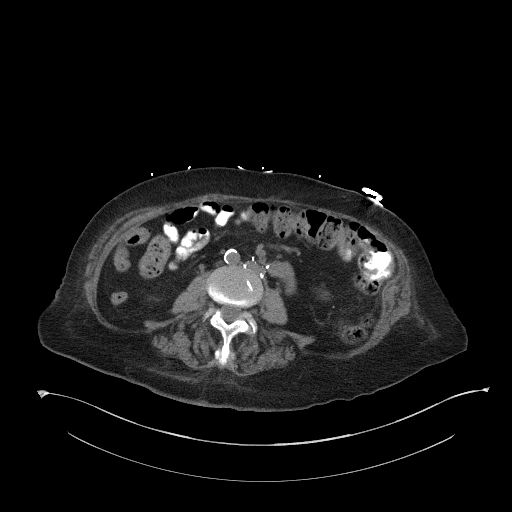
[im 55/90  soft-tissue]
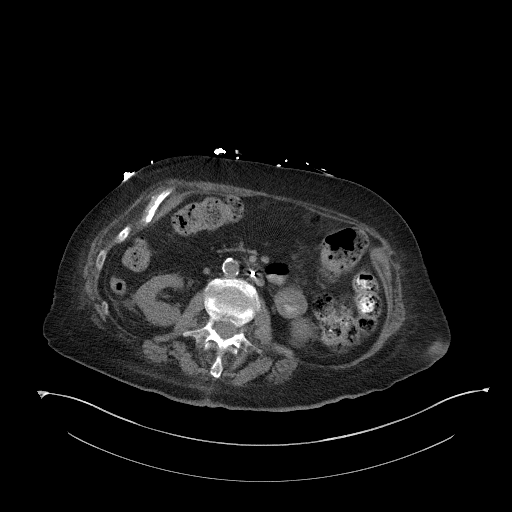
[im 62/90  soft-tissue]
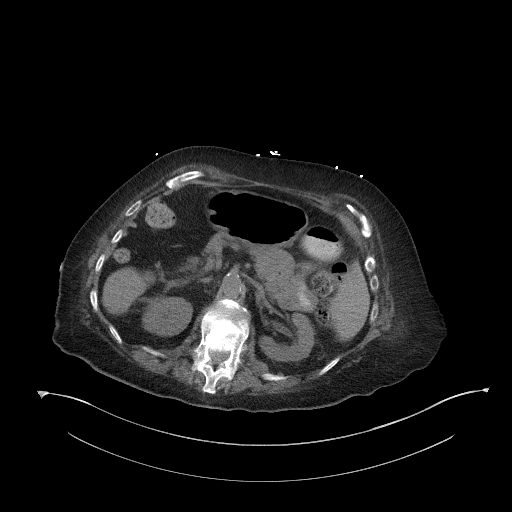
[im 62/90  bone]
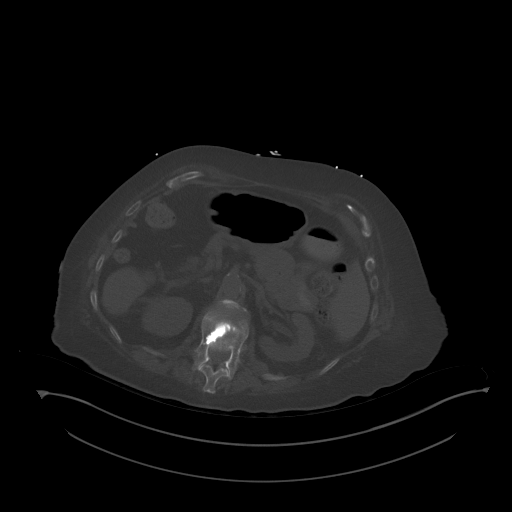
[im 69/90  soft-tissue]
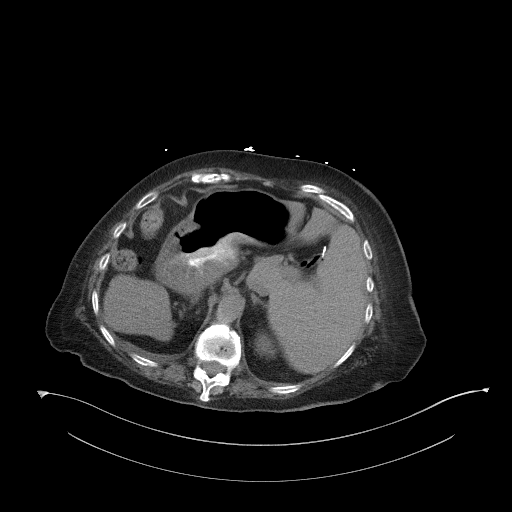
[im 76/90  soft-tissue]
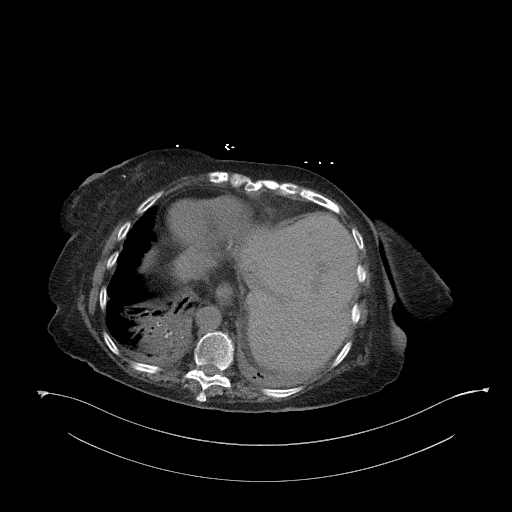
[im 83/90  soft-tissue]
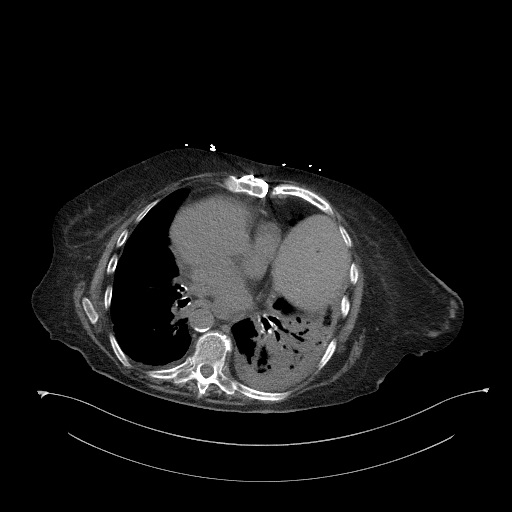

[Series 7: a/p w/o cor · coronal · non-contrast · 0.88mm/px · 3 of 136 slices shown]
[im 46/136  soft-tissue]
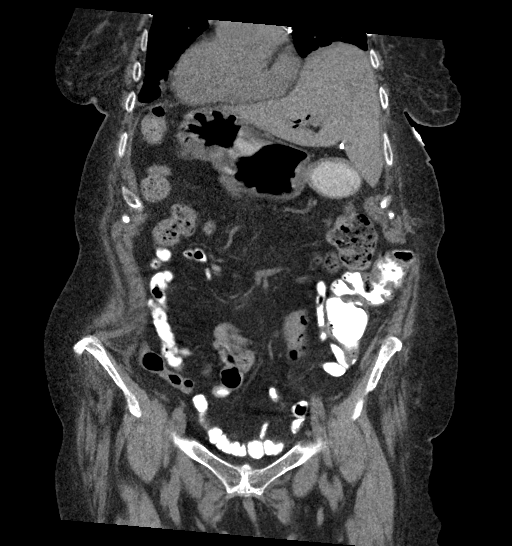
[im 61/136  soft-tissue]
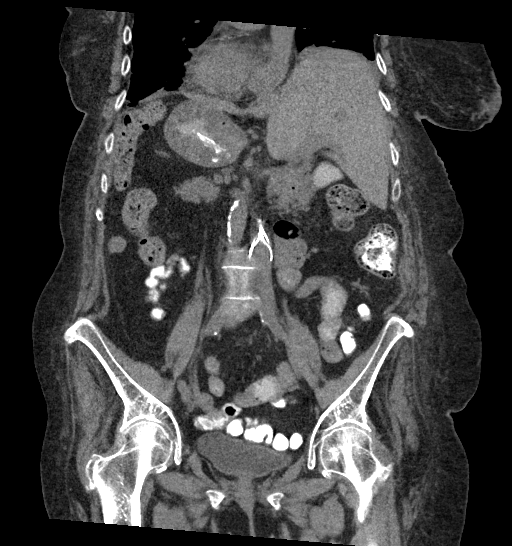
[im 76/136  soft-tissue]
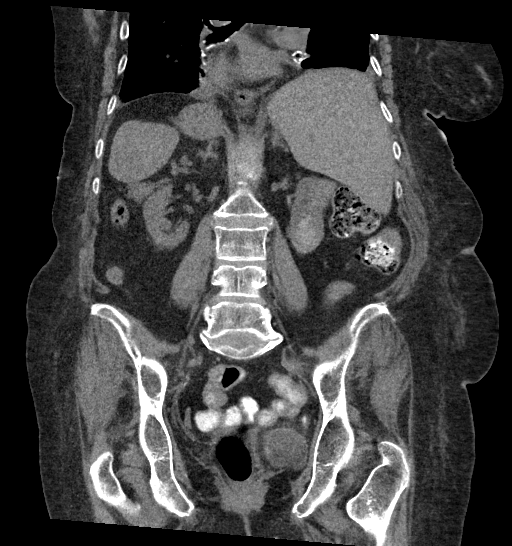

[15 of 46 positions shown; findings below may reference images not displayed]

FINDINGS: Comment: Patient has situs inversus totalis.

Lower chest: Widespread peribronchovascular micronodularity noted
throughout the lung bases, increased compared to the prior study.
There is some associated cylindrical bronchiectasis, most evident in
the left lower lobe where there is also profound thickening of the
peribronchovascular interstitium. Some airspace consolidation is
also noted in the left lower lobe, concerning for pneumonia. Small
left and trace right pleural effusions lying dependently.
Dextrocardia. Status post median sternotomy for CABG.

Hepatobiliary: Left-sided liver. No definite cystic or solid hepatic
lesions are confidently identified on today's noncontrast CT
examination. Small amount of pneumobilia, presumably related to
prior sphincterotomy, similar to the prior study. Status post
cholecystectomy.

Pancreas: No definite pancreatic mass or peripancreatic inflammatory
changes are noted on today's noncontrast CT examination.

Spleen: Right-sided spleen.  Otherwise, unremarkable.

Adrenals/Urinary Tract: Unenhanced appearance of the kidneys and
bilateral adrenal glands is normal. No hydroureteronephrosis.
Urinary bladder is unremarkable in appearance.

Stomach/Bowel: Right-sided stomach. Appearance of the stomach is
otherwise unremarkable. No pathologic dilatation of small bowel or
colon. Status post appendectomy.

Vascular/Lymphatic: Aortic atherosclerosis without definite aneurysm
in the abdominal or pelvic vasculature. IVC filter in position with
tip terminating shortly before the level of the renal veins. No
lymphadenopathy noted in the abdomen or pelvis.

Reproductive: Status posthysterectomy. Ovaries are not confidently
identified may be surgically absent or atrophic.

Other: No significant volume of ascites.  No pneumoperitoneum.

Musculoskeletal: There are no aggressive appearing lytic or blastic
lesions noted in the visualized portions of the skeleton. Chronic
compression fracture of T12 again noted with 50% loss of anterior
vertebral body height. New post vertebroplasty changes are noted at
T12. There is also a new compression fracture at L1 with post
vertebroplasty changes an approximately 50% loss of anterior
vertebral body height. Median sternotomy wires.
IMPRESSION: 1. No acute findings are noted in the abdomen or pelvis to account
for the patient's symptoms.
2. Chronic changes in the lung bases bilaterally suggestive of
indolent atypical infectious process such is WAGATA (mycobacterium
avium intracellulare), with extensive scarring and what appears to
be an acute consolidation in the left lower lobe concerning for
superimposed acute pneumonia.
3. Trace bilateral pleural effusions.
4. Aortic atherosclerosis.
5. Situs inversus totalis
6. Additional incidental findings, as above.

Aortic Atherosclerosis ([id number]-J19.9).
